# Patient Record
Sex: Female | Born: 1960 | Race: White | Hispanic: No | Smoking: Former smoker
Health system: Southern US, Community
[De-identification: ages and names within clinical notes are randomized; demographics above are authoritative.]

## PROBLEM LIST (undated history)

## (undated) DIAGNOSIS — I639 Cerebral infarction, unspecified: Secondary | ICD-10-CM

## (undated) DIAGNOSIS — T7840XA Allergy, unspecified, initial encounter: Secondary | ICD-10-CM

## (undated) DIAGNOSIS — E079 Disorder of thyroid, unspecified: Secondary | ICD-10-CM

## (undated) DIAGNOSIS — F32A Depression, unspecified: Secondary | ICD-10-CM

## (undated) DIAGNOSIS — I4819 Other persistent atrial fibrillation: Secondary | ICD-10-CM

## (undated) DIAGNOSIS — K219 Gastro-esophageal reflux disease without esophagitis: Secondary | ICD-10-CM

## (undated) DIAGNOSIS — I219 Acute myocardial infarction, unspecified: Secondary | ICD-10-CM

## (undated) DIAGNOSIS — R000 Tachycardia, unspecified: Secondary | ICD-10-CM

## (undated) DIAGNOSIS — E785 Hyperlipidemia, unspecified: Secondary | ICD-10-CM

## (undated) DIAGNOSIS — J302 Other seasonal allergic rhinitis: Secondary | ICD-10-CM

## (undated) DIAGNOSIS — E7800 Pure hypercholesterolemia, unspecified: Secondary | ICD-10-CM

## (undated) DIAGNOSIS — Z8639 Personal history of other endocrine, nutritional and metabolic disease: Secondary | ICD-10-CM

## (undated) DIAGNOSIS — I509 Heart failure, unspecified: Secondary | ICD-10-CM

## (undated) DIAGNOSIS — I10 Essential (primary) hypertension: Secondary | ICD-10-CM

## (undated) DIAGNOSIS — E119 Type 2 diabetes mellitus without complications: Secondary | ICD-10-CM

## (undated) DIAGNOSIS — L409 Psoriasis, unspecified: Secondary | ICD-10-CM

## (undated) DIAGNOSIS — Z9289 Personal history of other medical treatment: Secondary | ICD-10-CM

## (undated) DIAGNOSIS — M1990 Unspecified osteoarthritis, unspecified site: Secondary | ICD-10-CM

## (undated) DIAGNOSIS — I428 Other cardiomyopathies: Secondary | ICD-10-CM

## (undated) DIAGNOSIS — B019 Varicella without complication: Secondary | ICD-10-CM

## (undated) DIAGNOSIS — E669 Obesity, unspecified: Secondary | ICD-10-CM

## (undated) DIAGNOSIS — I43 Cardiomyopathy in diseases classified elsewhere: Secondary | ICD-10-CM

## (undated) DIAGNOSIS — I499 Cardiac arrhythmia, unspecified: Secondary | ICD-10-CM

## (undated) HISTORY — DX: Essential (primary) hypertension: I10

## (undated) HISTORY — DX: Other persistent atrial fibrillation: I48.19

## (undated) HISTORY — DX: Personal history of other medical treatment: Z92.89

## (undated) HISTORY — DX: Allergy, unspecified, initial encounter: T78.40XA

## (undated) HISTORY — DX: Depression, unspecified: F32.A

## (undated) HISTORY — DX: Acute myocardial infarction, unspecified: I21.9

## (undated) HISTORY — PX: EYE MUSCLE SURGERY: SHX370

## (undated) HISTORY — DX: Cerebral infarction, unspecified: I63.9

## (undated) HISTORY — DX: Varicella without complication: B01.9

## (undated) HISTORY — DX: Disorder of thyroid, unspecified: E07.9

## (undated) HISTORY — DX: Heart failure, unspecified: I50.9

---
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
## 1994-12-01 HISTORY — PX: BIOPSY THYROID: PRO38

## 2001-12-29 ENCOUNTER — Emergency Department (HOSPITAL_COMMUNITY): Admission: EM | Admit: 2001-12-29 | Discharge: 2001-12-29 | Payer: Self-pay | Admitting: Emergency Medicine

## 2002-01-03 ENCOUNTER — Emergency Department (HOSPITAL_COMMUNITY): Admission: EM | Admit: 2002-01-03 | Discharge: 2002-01-03 | Payer: Self-pay | Admitting: *Deleted

## 2002-11-10 ENCOUNTER — Encounter: Payer: Self-pay | Admitting: Emergency Medicine

## 2002-11-10 ENCOUNTER — Emergency Department (HOSPITAL_COMMUNITY): Admission: EM | Admit: 2002-11-10 | Discharge: 2002-11-10 | Payer: Self-pay | Admitting: Emergency Medicine

## 2003-03-07 ENCOUNTER — Inpatient Hospital Stay (HOSPITAL_COMMUNITY): Admission: AD | Admit: 2003-03-07 | Discharge: 2003-03-07 | Payer: Self-pay | Admitting: Obstetrics and Gynecology

## 2003-03-07 ENCOUNTER — Encounter: Payer: Self-pay | Admitting: Emergency Medicine

## 2004-02-02 ENCOUNTER — Emergency Department (HOSPITAL_COMMUNITY): Admission: EM | Admit: 2004-02-02 | Discharge: 2004-02-02 | Payer: Self-pay | Admitting: Emergency Medicine

## 2004-09-10 ENCOUNTER — Emergency Department (HOSPITAL_COMMUNITY): Admission: EM | Admit: 2004-09-10 | Discharge: 2004-09-10 | Payer: Self-pay | Admitting: Emergency Medicine

## 2004-12-03 ENCOUNTER — Emergency Department (HOSPITAL_COMMUNITY): Admission: EM | Admit: 2004-12-03 | Discharge: 2004-12-03 | Payer: Self-pay | Admitting: Emergency Medicine

## 2004-12-10 ENCOUNTER — Emergency Department (HOSPITAL_COMMUNITY): Admission: EM | Admit: 2004-12-10 | Discharge: 2004-12-10 | Payer: Self-pay | Admitting: Emergency Medicine

## 2005-10-09 ENCOUNTER — Ambulatory Visit: Payer: Self-pay | Admitting: Internal Medicine

## 2005-10-13 ENCOUNTER — Ambulatory Visit: Payer: Self-pay | Admitting: Internal Medicine

## 2005-11-03 ENCOUNTER — Ambulatory Visit (HOSPITAL_COMMUNITY): Admission: RE | Admit: 2005-11-03 | Discharge: 2005-11-03 | Payer: Self-pay | Admitting: Internal Medicine

## 2005-11-03 ENCOUNTER — Ambulatory Visit: Payer: Self-pay | Admitting: Endocrinology

## 2005-11-06 ENCOUNTER — Ambulatory Visit: Payer: Self-pay | Admitting: Pulmonary Disease

## 2006-02-03 ENCOUNTER — Emergency Department (HOSPITAL_COMMUNITY): Admission: EM | Admit: 2006-02-03 | Discharge: 2006-02-03 | Payer: Self-pay | Admitting: Emergency Medicine

## 2008-04-30 ENCOUNTER — Emergency Department (HOSPITAL_COMMUNITY): Admission: EM | Admit: 2008-04-30 | Discharge: 2008-04-30 | Payer: Self-pay | Admitting: Emergency Medicine

## 2010-12-21 ENCOUNTER — Encounter: Payer: Self-pay | Admitting: Internal Medicine

## 2013-10-25 ENCOUNTER — Emergency Department (HOSPITAL_COMMUNITY): Payer: Self-pay

## 2013-10-25 ENCOUNTER — Observation Stay (HOSPITAL_COMMUNITY)
Admission: EM | Admit: 2013-10-25 | Discharge: 2013-10-26 | Disposition: A | Discharge status: Home / Self Care | Payer: Self-pay | Attending: Cardiovascular Disease | Admitting: Cardiovascular Disease

## 2013-10-25 ENCOUNTER — Encounter (HOSPITAL_COMMUNITY): Payer: Self-pay | Admitting: Emergency Medicine

## 2013-10-25 DIAGNOSIS — R000 Tachycardia, unspecified: Secondary | ICD-10-CM | POA: Insufficient documentation

## 2013-10-25 DIAGNOSIS — E119 Type 2 diabetes mellitus without complications: Secondary | ICD-10-CM | POA: Diagnosis present

## 2013-10-25 DIAGNOSIS — Z87891 Personal history of nicotine dependence: Secondary | ICD-10-CM | POA: Insufficient documentation

## 2013-10-25 DIAGNOSIS — E785 Hyperlipidemia, unspecified: Secondary | ICD-10-CM | POA: Diagnosis present

## 2013-10-25 DIAGNOSIS — E669 Obesity, unspecified: Secondary | ICD-10-CM | POA: Diagnosis present

## 2013-10-25 DIAGNOSIS — Z8639 Personal history of other endocrine, nutritional and metabolic disease: Secondary | ICD-10-CM | POA: Diagnosis present

## 2013-10-25 DIAGNOSIS — E1159 Type 2 diabetes mellitus with other circulatory complications: Secondary | ICD-10-CM | POA: Diagnosis present

## 2013-10-25 DIAGNOSIS — E1142 Type 2 diabetes mellitus with diabetic polyneuropathy: Secondary | ICD-10-CM | POA: Diagnosis present

## 2013-10-25 DIAGNOSIS — I43 Cardiomyopathy in diseases classified elsewhere: Secondary | ICD-10-CM | POA: Diagnosis present

## 2013-10-25 DIAGNOSIS — R062 Wheezing: Secondary | ICD-10-CM | POA: Insufficient documentation

## 2013-10-25 DIAGNOSIS — I4891 Unspecified atrial fibrillation: Principal | ICD-10-CM

## 2013-10-25 DIAGNOSIS — M1990 Unspecified osteoarthritis, unspecified site: Secondary | ICD-10-CM | POA: Diagnosis present

## 2013-10-25 HISTORY — DX: Cardiomyopathy in diseases classified elsewhere: I43

## 2013-10-25 HISTORY — DX: Other cardiomyopathies: I42.8

## 2013-10-25 HISTORY — DX: Cardiomyopathy in diseases classified elsewhere: R00.0

## 2013-10-25 HISTORY — DX: Personal history of other endocrine, nutritional and metabolic disease: Z86.39

## 2013-10-25 HISTORY — DX: Hyperlipidemia, unspecified: E78.5

## 2013-10-25 HISTORY — DX: Unspecified osteoarthritis, unspecified site: M19.90

## 2013-10-25 HISTORY — DX: Obesity, unspecified: E66.9

## 2013-10-25 LAB — CBC WITH DIFFERENTIAL/PLATELET
Basophils Absolute: 0.1 10*3/uL (ref 0.0–0.1)
Basophils Relative: 1 % (ref 0–1)
Eosinophils Relative: 2 % (ref 0–5)
HCT: 43.3 % (ref 36.0–46.0)
MCH: 33.8 pg (ref 26.0–34.0)
MCHC: 36.3 g/dL — ABNORMAL HIGH (ref 30.0–36.0)
MCV: 93.1 fL (ref 78.0–100.0)
Monocytes Absolute: 0.6 10*3/uL (ref 0.1–1.0)
Neutrophils Relative %: 65 % (ref 43–77)
RBC: 4.65 MIL/uL (ref 3.87–5.11)
RDW: 12.8 % (ref 11.5–15.5)

## 2013-10-25 LAB — PROTIME-INR: Prothrombin Time: 12.8 seconds (ref 11.6–15.2)

## 2013-10-25 LAB — COMPREHENSIVE METABOLIC PANEL
AST: 13 U/L (ref 0–37)
AST: 11 U/L (ref 0–37)
Albumin: 3.5 g/dL (ref 3.5–5.2)
Alkaline Phosphatase: 71 U/L (ref 39–117)
BUN: 7 mg/dL (ref 6–23)
Calcium: 8.4 mg/dL (ref 8.4–10.5)
Calcium: 8.6 mg/dL (ref 8.4–10.5)
Chloride: 96 mEq/L (ref 96–112)
Creatinine, Ser: 0.43 mg/dL — ABNORMAL LOW (ref 0.50–1.10)
GFR calc Af Amer: >90 mL/min (ref >90)
GFR calc non Af Amer: >90 mL/min (ref >90)
GFR calc non Af Amer: >90 mL/min (ref >90)
Glucose, Bld: 179 mg/dL — ABNORMAL HIGH (ref 70–99)
Total Protein: 7.1 g/dL (ref 6.0–8.3)

## 2013-10-25 LAB — POCT I-STAT TROPONIN I: Troponin i, poc: 0.01 ng/mL (ref 0.00–0.08)

## 2013-10-25 LAB — TSH: TSH: 1.02 u[IU]/mL (ref 0.350–4.500)

## 2013-10-25 LAB — GLUCOSE, CAPILLARY: Glucose-Capillary: 168 mg/dL — ABNORMAL HIGH (ref 70–99)

## 2013-10-25 LAB — MAGNESIUM: Magnesium: 1.8 mg/dL (ref 1.5–2.5)

## 2013-10-25 LAB — T4, FREE: Free T4: 0.98 ng/dL (ref 0.80–1.80)

## 2013-10-25 LAB — TROPONIN I: Troponin I: <0.3 ng/mL (ref <0.30)

## 2013-10-25 MED ORDER — SODIUM CHLORIDE 0.9 % IJ SOLN: 3.0000 mL | INTRAMUSCULAR | Status: DC | PRN

## 2013-10-25 MED ORDER — WARFARIN SODIUM 7.5 MG PO TABS
7.5000 mg | ORAL_TABLET | Freq: Once | ORAL | Status: DC
  Filled 2013-10-25: qty 1

## 2013-10-25 MED ORDER — ACETAMINOPHEN 325 MG PO TABS
650.0000 mg | ORAL_TABLET | ORAL | Status: DC | PRN
  Administered 2013-10-25 – 2013-10-26 (×3): 650 mg via ORAL
  Filled 2013-10-25 (×3): qty 2

## 2013-10-25 MED ORDER — DILTIAZEM HCL 100 MG IV SOLR
5.0000 mg/h | INTRAVENOUS | Status: DC
  Administered 2013-10-25 – 2013-10-26 (×3): 10 mg/h via INTRAVENOUS
  Filled 2013-10-25 (×3): qty 100

## 2013-10-25 MED ORDER — DILTIAZEM LOAD VIA INFUSION
10.0000 mg | Freq: Once | INTRAVENOUS | Status: AC
  Administered 2013-10-25: 10 mg via INTRAVENOUS
  Filled 2013-10-25: qty 10

## 2013-10-25 MED ORDER — WARFARIN SODIUM 7.5 MG PO TABS
7.5000 mg | ORAL_TABLET | Freq: Once | ORAL | Status: AC
  Administered 2013-10-25: 7.5 mg via ORAL
  Filled 2013-10-25: qty 1

## 2013-10-25 MED ORDER — COUMADIN BOOK
Freq: Once | Status: AC
  Administered 2013-10-25: 1
  Filled 2013-10-25: qty 1

## 2013-10-25 MED ORDER — HEPARIN BOLUS VIA INFUSION
5000.0000 [IU] | Freq: Once | INTRAVENOUS | Status: AC
  Administered 2013-10-25: 5000 [IU] via INTRAVENOUS
  Filled 2013-10-25: qty 5000

## 2013-10-25 MED ORDER — SODIUM CHLORIDE 0.9 % IV SOLN
250.0000 mL | INTRAVENOUS | Status: DC | PRN
  Administered 2013-10-26: 250 mL via INTRAVENOUS

## 2013-10-25 MED ORDER — ONDANSETRON HCL 4 MG/2ML IJ SOLN: 4.0000 mg | Freq: Four times a day (QID) | INTRAMUSCULAR | Status: DC | PRN

## 2013-10-25 MED ORDER — WARFARIN - PHARMACIST DOSING INPATIENT: Freq: Every day | Status: DC

## 2013-10-25 MED ORDER — SODIUM CHLORIDE 0.9 % IJ SOLN: 3.0000 mL | Freq: Two times a day (BID) | INTRAMUSCULAR | Status: DC

## 2013-10-25 MED ORDER — HEPARIN (PORCINE) IN NACL 100-0.45 UNIT/ML-% IJ SOLN
1700.0000 [IU]/h | INTRAMUSCULAR | Status: DC
  Administered 2013-10-25: 1300 [IU]/h via INTRAVENOUS
  Administered 2013-10-26 (×2): 1700 [IU]/h via INTRAVENOUS
  Filled 2013-10-25 (×3): qty 250

## 2013-10-25 MED ORDER — WARFARIN VIDEO
Freq: Once | Status: AC
  Administered 2013-10-26: 12:00:00

## 2013-10-25 NOTE — ED Notes (Addendum)
Pt verbalizes she has had SHOB and non-productive cough x 3 days that worsens at night and w/ exertion. Pt denies Chest pain

## 2013-10-25 NOTE — ED Notes (Signed)
Family at bedside. 

## 2013-10-25 NOTE — Progress Notes (Signed)
ANTICOAGULATION CONSULT NOTE - Initial Consult  Pharmacy Consult for heparin and coumadin Indication: atrial fibrillation  Allergies  Allergen Reactions  . Codeine Itching and Rash    Patient Measurements: Weight: 240 lb (108.863 kg), height 66 inches Heparin Dosing Weight: 84 kg  Vital Signs: Temp: 98.6 F (37 C) (11/25 1139) Temp src: Oral (11/25 1139) BP: 119/85 mmHg (11/25 1615) Pulse Rate: 100 (11/25 1615)  Labs:  Recent Labs  10/25/13 1224  HGB 15.7*  HCT 43.3  PLT 259  CREATININE 0.43*    CrCl is unknown because there is no height on file for the current visit.   Medical History: Past Medical History  Diagnosis Date  . Hyperglycemia   . Obesity     Medications:  See med rec   Assessment: Patient is a 52 y.o F presented to the ED with c/o SOB and generalized weakness. To start heparin and coumadin for new onset Afib with RVR.  Patient stated that she is not on any anticoagulations PTA.  Goal of Therapy:  Heparin level 0.3-0.7 units/ml; INR 2-3 Monitor platelets by anticoagulation protocol: Yes   Plan:  1) heparin 5000 units IV bolus, then drip at 1300 units/hr 2) check 6 hour heparin level 3) coumadin 7.5mg  PO x1 today  Kelly Lang P 10/25/2013,5:46 PM

## 2013-10-25 NOTE — Discharge Planning (Signed)
[facility] Felicia E.- Community Liaison ext [phone]  Follow up appointment was made for 11/16/13 at 2:00 pm, with the [facility] to establish primary care. Patient will also be obtaining the orange card at this visit. Patient was given the orange card application and my contact information for any questions or concerns. Patient is aware of both appointments.

## 2013-10-25 NOTE — H&P (Signed)
Patient ID: Kelly Lang MRN: [id number], DOB/AGE: 1961-08-06   Admit date: 10/25/2013  Primary Physician: No PCP Per Patient  Primary Cardiologist: Amyia Lodwick  Pt. Profile:  Kelly Lang is an obese 52 y.o. female with no past medical history who presented to the ED with new onset Afib with RVR. has not been medically evaluated in approx 8 years. Has no PCP and has not had any medical problems. She started noticing symptoms of cough and cold on Thursday. Saturday morning she developed SOB associated with chest tightness. It was worse with exertion and made better with rest.She hasnt felt her racing heart or had palpitations. Admits to a little lightheadedness, lethargy and weakness. Denies syncope, swelling, orthopnea.  Patient is currently feeling much better after diltiazem in ED.   Problem List  Past Medical History  Diagnosis Date  . Hyperglycemia     Past Surgical History  Procedure Laterality Date  . Tonsillectomy      when she was 9  . Biopsy thyroid      1996  . Eye muscle surgery      young girl     Allergies  Allergies  Allergen Reactions  . Codeine Itching and Rash    Home Medications  Prior to Admission medications   Medication Sig Start Date End Date Taking? Authorizing Provider  ibuprofen (ADVIL,MOTRIN) 100 MG tablet Take 100 mg by mouth every 6 (six) hours as needed for pain or fever.   Yes Historical Provider, MD  pseudoephedrine-guaifenesin (MUCINEX D) 60-600 MG per tablet Take 1 tablet by mouth every 12 (twelve) hours.   Yes Historical Provider, MD    Family History  History reviewed. No pertinent family history.  Social History  History   Social History  . Marital Status: Single    Spouse Name: N/A    Number of Children: N/A  . Years of Education: N/A   Occupational History  . banquet coordinator    Social History Main Topics  . Smoking status: Former Smoker -- 1.00 packs/day for 22 years  . Smokeless tobacco: Not on file  .  Alcohol Use: No  . Drug Use: No  . Sexual Activity: Not on file   Other Topics Concern  . Not on file   Social History Narrative   Moved to [place] from [place] in 2002. She has only seen one medical doctor in that time period when she had insurance. 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     All other systems reviewed and are otherwise negative except as noted above.  Physical Exam  Blood pressure 119/85, pulse 100, temperature 98.6 F (37 C), temperature source Oral, resp. rate 28, weight 240 lb (108.863 kg), SpO2 94.00%.   General: Pleasant, NAD, obese Psych: Normal affect. Neuro: Alert and oriented X 3. Moves all extremities spontaneously. HEENT: Normal  Neck: Supple without bruits or JVD. Lungs:  Resp regular and unlabored, CTA. Rales at the bases Heart: Irregular heart rate and rhythm  Abdomen: Soft, non-tender, non-distended, BS + x 4.  Extremities: No clubbing, cyanosis or edema. DP/PT/Radials 2+ and equal bilaterally.  Labs  No results found for this basename: CKTOTAL, CKMB, TROPONINI,  in the last 72 hours Lab Results  Component Value Date   WBC 8.0 10/25/2013   HGB 15.7* 10/25/2013   HCT 43.3 10/25/2013   MCV 93.1 10/25/2013   PLT 259 10/25/2013     Recent Labs Lab 10/25/13 1224  NA 131*  K 4.0  CL 99  CO2 20  BUN 10  CREATININE 0.43*  CALCIUM 8.4  PROT 7.4  BILITOT 0.7  ALKPHOS 79  ALT 21  AST 13  GLUCOSE 230*     Radiology/Studies  Dg Chest Portable 1 View  10/25/2013   CLINICAL DATA:  Short of breath.  Tachycardia.  EXAM: PORTABLE CHEST - 1 VIEW  COMPARISON:  None.  FINDINGS: The heart size and mediastinal contours are within normal limits. Both lungs are clear. The visualized skeletal structures are unremarkable.  IMPRESSION: No active disease.   Electronically Signed   By: Amie Portland M.D.   On: 10/25/2013 12:30   ECG Afib with RVR     HR 136  ASSESSMENT AND PLAN  Kelly Lang is an obese 52 y.o.  female with no past medical history who presented to the ED with new onset Afib with RVR.  1. AFib with RVR - Diltiazem bolus and drip in ED, improved HR 90-110s, feeling much better currently - History of thyroid problems, unclear history. Will re-evaluate need for DCCV when thyroid studies return - Admit overnight with rate control, continue Diltiazem  - ECHO tomorrow morning - Heparin ggt per pharmacy - Start coumadin - CHADS score unable to be determined currently. HgA1c pending, will monitor BPs (currently normal)  2. History of thyroid issues- unknown problem, stopped taking her synthroid years ago - Thyroid biopsy in 1996  - TSH and free T4 pending  3. Hyperglycemia: BG 230 in ED - Ordered a hg A1C   Signed, Thereasa Parkin, PA-C 10/25/2013, 5:10 PM  Attending Note:   The patient was seen and examined.  Agree with assessment and plan as noted above.  Changes made to the above note as needed.  Pt presents with AF with RVR.  She has not taken her tyhroid medication in years. Her HR has bee slowed with Dilt drip and she is tolerating it with out difficulty.  Will get echo tomorrow.  She may have had AF with RVR for some time - If her LV function is normal - could use PO Dilt.  If the LV function is depressed, would starte PO BB.    Will start heparin , transition to coumadin.  She does not have insurance and would not likeily be able to afford NOAC.  I would favor 4 weeks of anticoagulation and then attempt cardioversion.  If she has significant thyroid issues, it will be difficult to keep her in NSR.      I would not necessarily think that she needs to be therapeutic prior to DC.  Could use Lovenox bridging.    will have Kelly Lang see her in the office in 1 month and I will see her in 1-2 months.  She will need to be set up in the coumadin clinic.   We can cardiovert her after a month of therapeutic anticoagulation.   Kelly Lang, Kelly Lang., MD, [facility] 10/25/2013, 6:31 PM

## 2013-10-25 NOTE — ED Notes (Signed)
MD at bedside. 

## 2013-10-25 NOTE — ED Provider Notes (Signed)
CSN: [id number]     Arrival date & time 10/25/13  1128 History   First MD Initiated Contact with Patient 10/25/13 1203     Chief Complaint  Patient presents with  . Shortness of Breath  . Tachycardia   (Consider location/radiation/quality/duration/timing/severity/associated sxs/prior Treatment) Patient is a 52 y.o. female presenting with shortness of breath. The history is provided by the patient. No language interpreter was used.  Shortness of Breath Severity:  Moderate Associated symptoms: wheezing   Associated symptoms: no abdominal pain, no chest pain, no fever, no headaches and no vomiting   Associated symptoms comment:  Shortness of breath and a feeling of wheezing with chest tightness for the past 3 days. No fever or cough. She has not had any nausea or vomiting. She denies palpitations or history of asthma, cardiac problems.    History reviewed. No pertinent past medical history. History reviewed. No pertinent past surgical history. History reviewed. No pertinent family history. History  Substance Use Topics  . Smoking status: Former Games developer  . Smokeless tobacco: Not on file  . Alcohol Use: No   OB History   Grav Para Term Preterm Abortions TAB SAB Ect Mult Living                 Review of Systems  Constitutional: Negative for fever and chills.  HENT: Negative.   Respiratory: Positive for shortness of breath and wheezing.   Cardiovascular: Negative.  Negative for chest pain and leg swelling.  Gastrointestinal: Negative.  Negative for nausea, vomiting and abdominal pain.  Genitourinary: Negative for dysuria.  Musculoskeletal: Negative.  Negative for myalgias.  Skin: Negative.  Negative for color change.  Neurological: Negative.  Negative for headaches.    Allergies  Codeine  Home Medications   Current Outpatient Rx  Name  Route  Sig  Dispense  Refill  . ibuprofen (ADVIL,MOTRIN) 100 MG tablet   Oral   Take 100 mg by mouth every 6 (six) hours as needed for  pain or fever.         . pseudoephedrine-guaifenesin (MUCINEX D) 60-600 MG per tablet   Oral   Take 1 tablet by mouth every 12 (twelve) hours.          BP 145/116  Pulse 144  Temp(Src) 98.6 F (37 C) (Oral)  Resp 12  Wt 240 lb (108.863 kg)  SpO2 96% Physical Exam  Constitutional: She is oriented to person, place, and time. She appears well-developed and well-nourished. No distress.  HENT:  Head: Normocephalic.  Neck: Normal range of motion. Neck supple.  Cardiovascular: An irregularly irregular rhythm present. Tachycardia present.   Pulmonary/Chest: Effort normal and breath sounds normal. She has no wheezes. She has no rales. She exhibits no tenderness.  Abdominal: Soft. Bowel sounds are normal. There is no tenderness. There is no rebound and no guarding.  Musculoskeletal: Normal range of motion. She exhibits no edema.  Neurological: She is alert and oriented to person, place, and time.  Skin: Skin is warm and dry. No rash noted. She is not diaphoretic.  Psychiatric: She has a normal mood and affect.    ED Course  Procedures (including critical care time) Labs Review Labs Reviewed  CBC WITH DIFFERENTIAL - Abnormal; Notable for the following:    Hemoglobin 15.7 (*)    MCHC 36.3 (*)    All other components within normal limits  COMPREHENSIVE METABOLIC PANEL - Abnormal; Notable for the following:    Sodium 131 (*)    Glucose, Bld 230 (*)  Creatinine, Ser 0.43 (*)    All other components within normal limits  TSH  T4, FREE  POCT I-STAT TROPONIN I   Imaging Review Dg Chest Portable 1 View  10/25/2013   CLINICAL DATA:  Short of breath.  Tachycardia.  EXAM: PORTABLE CHEST - 1 VIEW  COMPARISON:  None.  FINDINGS: The heart size and mediastinal contours are within normal limits. Both lungs are clear. The visualized skeletal structures are unremarkable.  IMPRESSION: No active disease.   Electronically Signed   By: Amie Portland M.D.   On: 10/25/2013 12:30    EKG  Interpretation    Date/Time:  Tuesday October 25 2013 11:34:53 EST Ventricular Rate:  136 PR Interval:    QRS Duration: 88 QT Interval:  336 QTC Calculation: 505 R Axis:   -23 Text Interpretation:  Atrial fibrillation with rapid ventricular response Septal infarct , age undetermined Abnormal ECG Confirmed by RAY MD, DANIELLE ([id number]) on 10/25/2013 12:36:36 PM            MDM  No diagnosis found. 1. Atrial Fib, new onset, with RVR 2. Hyperglycemia  Cardizem bolus and infusion ordered. Patient is NAD, comfortable. Labs pending. Will monitor and re-evaluate after Cardizem. Dr. Rosalia Hammers aware.  HR decreased to 90's after Cardizem. 5mg /hr drip going. BP 126/78. She is comfortable. Discussed admission with patient and with cardiology who will see and admit the patient from Pod E.     Arnoldo Hooker, PA-C 10/25/13 1443

## 2013-10-25 NOTE — ED Provider Notes (Signed)
I have reviewed the report and personally reviewed the above radiology studies.    Hilario Quarry, MD 10/25/13 [phone]

## 2013-10-25 NOTE — ED Notes (Signed)
Pt c/o SOB x 3 days with generalized weakness; pt noted to be tachycardic 140bpm

## 2013-10-26 ENCOUNTER — Encounter (HOSPITAL_COMMUNITY): Payer: Self-pay | Admitting: Physician Assistant

## 2013-10-26 ENCOUNTER — Other Ambulatory Visit: Payer: Self-pay | Admitting: Physician Assistant

## 2013-10-26 DIAGNOSIS — E119 Type 2 diabetes mellitus without complications: Secondary | ICD-10-CM | POA: Diagnosis present

## 2013-10-26 DIAGNOSIS — E1159 Type 2 diabetes mellitus with other circulatory complications: Secondary | ICD-10-CM | POA: Diagnosis present

## 2013-10-26 DIAGNOSIS — Z8639 Personal history of other endocrine, nutritional and metabolic disease: Secondary | ICD-10-CM | POA: Diagnosis present

## 2013-10-26 DIAGNOSIS — E785 Hyperlipidemia, unspecified: Secondary | ICD-10-CM | POA: Diagnosis present

## 2013-10-26 DIAGNOSIS — E669 Obesity, unspecified: Secondary | ICD-10-CM | POA: Diagnosis present

## 2013-10-26 DIAGNOSIS — I43 Cardiomyopathy in diseases classified elsewhere: Secondary | ICD-10-CM | POA: Diagnosis present

## 2013-10-26 DIAGNOSIS — M1990 Unspecified osteoarthritis, unspecified site: Secondary | ICD-10-CM | POA: Diagnosis present

## 2013-10-26 DIAGNOSIS — I517 Cardiomegaly: Secondary | ICD-10-CM

## 2013-10-26 DIAGNOSIS — E1142 Type 2 diabetes mellitus with diabetic polyneuropathy: Secondary | ICD-10-CM | POA: Diagnosis present

## 2013-10-26 LAB — CBC
MCH: 33.6 pg (ref 26.0–34.0)
MCHC: 36.1 g/dL — ABNORMAL HIGH (ref 30.0–36.0)
Platelets: 245 10*3/uL (ref 150–400)

## 2013-10-26 LAB — PROTIME-INR
INR: 1.05 (ref 0.00–1.49)
Prothrombin Time: 13.5 seconds (ref 11.6–15.2)

## 2013-10-26 LAB — BASIC METABOLIC PANEL
Calcium: 8.6 mg/dL (ref 8.4–10.5)
Chloride: 100 mEq/L (ref 96–112)
Creatinine, Ser: 0.53 mg/dL (ref 0.50–1.10)
GFR calc Af Amer: >90 mL/min (ref >90)
GFR calc non Af Amer: >90 mL/min (ref >90)
Glucose, Bld: 224 mg/dL — ABNORMAL HIGH (ref 70–99)
Potassium: 3.8 mEq/L (ref 3.5–5.1)

## 2013-10-26 LAB — HEMOGLOBIN A1C: Mean Plasma Glucose: 240 mg/dL — ABNORMAL HIGH (ref <117)

## 2013-10-26 LAB — GLUCOSE, CAPILLARY
Glucose-Capillary: 199 mg/dL — ABNORMAL HIGH (ref 70–99)
Glucose-Capillary: 240 mg/dL — ABNORMAL HIGH (ref 70–99)

## 2013-10-26 LAB — LIPID PANEL
Cholesterol: 188 mg/dL (ref 0–200)
Total CHOL/HDL Ratio: 6.5 RATIO
Triglycerides: 300 mg/dL — ABNORMAL HIGH (ref <150)

## 2013-10-26 LAB — TROPONIN I: Troponin I: <0.3 ng/mL (ref <0.30)

## 2013-10-26 LAB — HEPARIN LEVEL (UNFRACTIONATED)
Heparin Unfractionated: <0.1 IU/mL — ABNORMAL LOW (ref 0.30–0.70)
Heparin Unfractionated: 0.16 IU/mL — ABNORMAL LOW (ref 0.30–0.70)

## 2013-10-26 MED ORDER — WARFARIN SODIUM 7.5 MG PO TABS
7.5000 mg | ORAL_TABLET | Freq: Once | ORAL | Status: DC
  Filled 2013-10-26: qty 1

## 2013-10-26 MED ORDER — METFORMIN HCL 500 MG PO TABS: 500.0000 mg | ORAL_TABLET | Freq: Every day | ORAL | Status: DC

## 2013-10-26 MED ORDER — HEPARIN BOLUS VIA INFUSION
3000.0000 [IU] | Freq: Once | INTRAVENOUS | Status: AC
  Administered 2013-10-26: 3000 [IU] via INTRAVENOUS
  Filled 2013-10-26: qty 3000

## 2013-10-26 MED ORDER — HEPARIN (PORCINE) IN NACL 100-0.45 UNIT/ML-% IJ SOLN
2100.0000 [IU]/h | INTRAMUSCULAR | Status: DC
  Administered 2013-10-26: 2100 [IU]/h via INTRAVENOUS
  Filled 2013-10-26 (×2): qty 250

## 2013-10-26 MED ORDER — WARFARIN SODIUM 5 MG PO TABS: 7.5000 mg | ORAL_TABLET | Freq: Every day | ORAL | Status: DC

## 2013-10-26 MED ORDER — LIVING WELL WITH DIABETES BOOK
Freq: Once | Status: AC
  Administered 2013-10-26: 09:00:00
  Filled 2013-10-26: qty 1

## 2013-10-26 MED ORDER — METOPROLOL TARTRATE 25 MG PO TABS: 25.0000 mg | ORAL_TABLET | Freq: Two times a day (BID) | ORAL | Status: DC

## 2013-10-26 MED ORDER — METOPROLOL TARTRATE 25 MG PO TABS
25.0000 mg | ORAL_TABLET | Freq: Two times a day (BID) | ORAL | Status: DC
  Administered 2013-10-26: 25 mg via ORAL
  Filled 2013-10-26 (×2): qty 1

## 2013-10-26 MED ORDER — PERFLUTREN LIPID MICROSPHERE
1.0000 mL | INTRAVENOUS | Status: AC | PRN
  Administered 2013-10-26: 1.8 mL via INTRAVENOUS
  Filled 2013-10-26: qty 10

## 2013-10-26 MED ORDER — METFORMIN HCL 500 MG PO TABS
500.0000 mg | ORAL_TABLET | Freq: Every day | ORAL | Status: DC
  Administered 2013-10-26: 500 mg via ORAL
  Filled 2013-10-26 (×2): qty 1

## 2013-10-26 NOTE — Progress Notes (Signed)
DC orders received.  Patient stable with no S/S of distress.  Medication and discharge information reviewed with patient and patient's family.  Patient DC home. Kelly Lang  

## 2013-10-26 NOTE — Discharge Summary (Signed)
Discharge Summary   Patient ID: Kelly Lang MRN: [id number], DOB/AGE: October 18, 1961 52 y.o. Admit date: 10/25/2013 D/C date:     10/26/2013  Primary Cardiologist: Dr. Elease Hashimoto PCP: (new) Doris Cheadle at [facility]    Active Problems:   Atrial fibrillation with RVR- newly diagnosed   Diabetes mellitus- newly diagnosed, uncontrolled    Tachycardia induced cardiomyopathy, EF 40-45%   H/O: hypothyroidism, TFTs normal   Obesity- Body mass index is 37.77 kg/(m^2).   Arthritis   Dyslipidemia    Hospital Course:   Kelly Lang is a 52 y.o. female with a history of obesity, newly diagnosed DM and newly diagnosed dyslipidemia who was admitted on 10/25/13 for atrial fibrillation with RVR.  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 Patient was in her usual state of health until this past week when she developed worsening SOB and chest pain with exertion. When she arrived to the emergency department she was found to be in AFib with RVR (HR 136).  Lab work also revealed hyperglycemia with a Hg A1C of 10 and elevated triglycerides. Of note, patient also reported a history of a thyroid biopsy in 1996 for unknown reason, followed by treatment with Levothyroxine. She quit taking the thyroid medication soon after and was lost to follow up. TSH and free t4 levels were normal. She ruled out for MI. She was not tachypnic or hypoxic. She was treated with IV diltiazem, which was eventaully changed to oral metoprolol. BB was chosen because ECHO revealed mildly reduced LV function (EF:40-45%). This was felt to be a tachycardia mediated cardiomyopathy.  Patient was placed on heparin and transitioned to coumadin. She will be sent home on coumadin with possible planned cardioversion in 4 weeks with Dr. Elease Hashimoto. Follow up appointment with Tereso Newcomer PA-C has been made in two weeks for re-evaluation. Care management  consulted and set up PCP appointment and medication assistance with orange card. She will be discharged home on Metformin for her newly diagnosed diabetes, metoprolol tartarate for Afib rate control and coumadin for anticoagulation. With the addition of BB for rate control blood pressure was a little soft, thus we will wait on adding an ACEi for her reduced EF until re-evaluated as an outpatient. She will also need a follow up ECHO in 2-3 months to re assess LV function. Dr. Patty Sermons has seen and evaluated the patient and deemed her ready for discharged today.   Discharge Vitals: Blood pressure 106/69, pulse 93, temperature 98.2 F (36.8 C), temperature source Oral, resp. rate 18, height 5\' 6"  (1.676 m), weight 233 lb 14.4 oz (106.096 kg), last menstrual period 10/09/2013, SpO2 99.00%.  Labs: Lab Results  Component Value Date   WBC 9.3 10/26/2013   HGB 15.0 10/26/2013   HCT 41.5 10/26/2013   MCV 92.8 10/26/2013   PLT 245 10/26/2013     Recent Labs Lab 10/25/13 2000 10/26/13 0448  NA 131* 133*  K 3.7 3.8  CL 96 100  CO2 25 21  BUN 7 7  CREATININE 0.48* 0.53  CALCIUM 8.6 8.6  PROT 7.1  --   BILITOT 0.9  --   ALKPHOS 71  --   ALT 20  --   AST 11  --   GLUCOSE 179* 224*    Recent Labs  10/25/13 1932 10/26/13 0448 10/26/13 1032  TROPONINI <0.30 <0.30 <0.30   Lab Results  Component Value Date   CHOL  188 10/26/2013   HDL 29* 10/26/2013   LDLCALC 99 10/26/2013   TRIG 300* 10/26/2013   No results found for this basename: DDIMER    Diagnostic Studies/Procedures   2D ECHO 10/26/13  LV EF: 40% - 45% ------------------------------------------------------------ Indications: Atrial fibrillation - 427.31. ------------------------------------------------------------ History: PMH: Obesity. ------------------------------------------------------------ Study Conclusions - Left ventricle: The cavity size was normal. Wall thickness was normal. Systolic function was mildly to  moderately reduced. The estimated ejection fraction was in the range of 40% to 45%. Wall motion was normal; there were no regional wall motion abnormalities. The study is not technically sufficient to allow evaluation of LV diastolic function. - Left atrium: The atrium was mildly dilated. - Right ventricle: Systolic pressure was increased. - Atrial septum: No defect or patent foramen ovale was identified. Transthoracic echocardiography. M-mode, complete 2D, spectral Doppler, and color Doppler. Height: Height: 167.6cm. Height: 66in. Weight: Weight: 106.1kg. Weight: 233.4lb. Body mass index: BMI: 37.8kg/m^2. Body surface area: BSA: 2.36m^2. Blood pressure: Pre-Definity 84/54. Post definity 112/70. Patient status: Inpatient. Location: Echo laboratory. ------------------------------------------------------------ ------------------------------------------------------------ Left ventricle: The cavity size was normal. Wall thickness was normal. Systolic function was mildly to moderately reduced. The estimated ejection fraction was in the range of 40% to 45%. Wall motion was normal; there were no regional wall motion abnormalities. The study is not technically sufficient to allow evaluation of LV diastolic function. ----------------------------------------------------------- Aortic valve: Structurally normal valve. Trileaflet. Cusp separation was normal. Doppler: Transvalvular velocity was within the normal range. There was no stenosis. No regurgitation. ------------------------------------------------------------ Aorta: The aorta was normal, not dilated, and non-diseased. ------------------------------------------------------------ Mitral valve: Structurally normal valve. Leaflet separation was normal. Doppler: Transvalvular velocity was within the normal range. There was no evidence for stenosis. No regurgitation. ------------------------------------------------------------ Left  atrium: The atrium was mildly dilated. ----------------------------------------------------------- Atrial septum: No defect or patent foramen ovale was identified. ----------------------------------------------------------- Right ventricle: The cavity size was normal. Wall thickness was normal. Systolic function was normal. Systolic pressure was increased. ------------------------------------------------------------ Pulmonic valve: Structurally normal valve. Cusp separation was normal. Doppler: Transvalvular velocity was within the normal range. No regurgitation. ------------------------------------------------------------ Tricuspid valve: Structurally normal valve. Leaflet separation was normal. Doppler: Transvalvular velocity was within the normal range. No significant regurgitation. ------------------------------------------------------------ Pulmonary artery: Systolic pressure was within the normal range. ------------------------------------------------------------ Right atrium: The atrium was normal in size.  ------------------------------------------------------------ Pericardium: There was no pericardial effusion.  ------------------------------------------------------------ Systemic veins: Inferior vena cava: The vessel was normal in size; the respirophasic diameter changes were in the normal range (= 50%); findings are consistent with normal central venous pressure.  ------------------------------------------------------------ Post procedure conclusions Ascending Aorta: - The aorta was normal, not dilated, and non-diseased.   Dg Chest Portable 1 View  10/25/2013   CLINICAL DATA:  Short of breath.  Tachycardia.  EXAM: PORTABLE CHEST - 1 VIEW  COMPARISON:  None.  FINDINGS: The heart size and mediastinal contours are within normal limits. Both lungs are clear. The visualized skeletal structures are unremarkable.  IMPRESSION: No active disease.      Discharge  Medications     Medication List    STOP taking these medications       pseudoephedrine-guaifenesin 60-600 MG per tablet  Commonly known as:  MUCINEX D      TAKE these medications       ibuprofen 100 MG tablet  Commonly known as:  ADVIL,MOTRIN  Take 100 mg by mouth every 6 (six) hours as needed for pain or fever.     metFORMIN 500 MG tablet  Commonly known  as:  GLUCOPHAGE  Take 1 tablet (500 mg total) by mouth daily with breakfast.     metoprolol tartrate 25 MG tablet  Commonly known as:  LOPRESSOR  Take 1 tablet (25 mg total) by mouth 2 (two) times daily.     warfarin 5 MG tablet  Commonly known as:  COUMADIN  Take 1.5 tablets (7.5 mg total) by mouth daily. Or as directed.        Disposition   The patient will be discharged in stable condition to home. Discharge Orders   Future Appointments Provider Department Dept Phone   10/31/2013 10:40 AM [facility] [place] Office [phone]   11/09/2013 8:30 AM Kennon Rounds [facility] [place] Office [phone]   11/16/2013 2:00 PM Doris Cheadle, MD [facility] [phone]   11/16/2013 2:30 PM [facility] Financial Counselor [facility] [phone]   Future Orders Complete By Expires   Diet - low sodium heart healthy  As directed    Discharge instructions  As directed    Comments:     Please take coumadin 1.5 tablets daily for now. You will get your INR (coumadin level) checked on Monday, Dec 1 and they will tell you if your dose needs adjusting.  Patients with atrial fibrillation should avoid products with pseudoephedrine, this includes medicines like Sudafed or cough and cold medicines with a "D" on the end.  Patients on blood thinners should generally avoid medicines like ibuprofen, Motrin or Advil due to risk of gastrointestinal bleeding. Please talk to your PCP about alternative options   Increase activity slowly  As  directed    Scheduling Instructions:     She is medically excused from work from Tuesday 10/25/13 through Monday 10/31/13     Follow-up Information   Follow up with [facility] On 11/16/2013. (PCP appt at 2:00pm and Orange Card appt at 2:30pm)    Contact information:   [address] [place] [address] [phone]      Follow up with [facility] On 10/31/2013. (10:40 am )    Contact information:   [address] [place] [address]       Follow up with Tereso Newcomer, PA-C On 11/09/2013. (8:30am)    Specialty:  Physician Assistant   Contact information:   [address] [place] [address] [phone]         Duration of Discharge Encounter: Greater than 30 minutes including physician and PA time.  SignedVenetia Maxon, KATHRYN PA-C 10/26/2013, 3:50 PM

## 2013-10-26 NOTE — Progress Notes (Signed)
Inpatient Diabetes Program Recommendations  AACE/ADA: New Consensus Statement on Inpatient Glycemic Control (2013)  Target Ranges:  Prepandial:   less than 140 mg/dL      Peak postprandial:   less than 180 mg/dL (1-2 hours)      Critically ill patients:  140 - 180 mg/dL   Inpatient Diabetes Program Recommendations HgbA1C: =10  Outpatient Referral: information for free diabetes management classes given to patient   Note: I spoke with patient concerning A1C=10. Patient states that this is a new diagnosis for her.  She states that she is familiar with diabetes since many family members have it.  Information given for a glucose meter and for free OP classes with the dietitian at [facility].  Living Well with Diabetes patient education workbook at bedside.  No questions/concerns at this time.   Thank you  Piedad Climes BSN, RN,CDE Inpatient Diabetes Coordinator [phone] (team pager)

## 2013-10-26 NOTE — Progress Notes (Addendum)
  Echocardiogram 2D Echocardiogram with Definity has been performed.  Cathie Beams 10/26/2013, 10:17 AM

## 2013-10-26 NOTE — Progress Notes (Signed)
ANTICOAGULATION CONSULT NOTE - Follow Up Consult  Pharmacy Consult for heparin Indication: atrial fibrillation  Labs:  Recent Labs  10/25/13 1224 10/25/13 1835 10/25/13 1932 10/25/13 2000 10/26/13 0448  HGB 15.7*  --   --   --  15.0  HCT 43.3  --   --   --  41.5  PLT 259  --   --   --  245  LABPROT  --  12.8  --   --  13.5  INR  --  0.98  --   --  1.05  HEPARINUNFRC  --   --   --   --  <0.10*  CREATININE 0.43*  --   --  0.48* 0.53  TROPONINI  --   --  <0.30  --  <0.30    Assessment: 52yo female undetectable on heparin with initial dosing for new Afib.  Goal of Therapy:  Heparin level 0.3-0.7 units/ml   Plan:  Will rebolus with heparin 3000 units x1 and increase gtt by 4 units/kg/hr to 1700 units/hr and check level in 6hr.  Vernard Gambles, PharmD, BCPS  10/26/2013,6:16 AM

## 2013-10-26 NOTE — Progress Notes (Signed)
ANTICOAGULATION CONSULT NOTE - Follow Up Consult  Pharmacy Consult for Heparin and Coumadin Indication: atrial fibrillation  Allergies  Allergen Reactions  . Codeine Itching and Rash    Patient Measurements: Height: 5\' 6"  (167.6 cm) Weight: 233 lb 14.4 oz (106.096 kg) IBW/kg (Calculated) : 59.3 Heparin Dosing Weight: 84kg  Vital Signs: Temp: 98.6 F (37 C) (11/26 0352) Temp src: Oral (11/26 0352) BP: 112/70 mmHg (11/26 1030) Pulse Rate: 108 (11/26 1030)  Labs:  Recent Labs  10/25/13 1224 10/25/13 1835 10/25/13 1932 10/25/13 2000 10/26/13 0448 10/26/13 1032 10/26/13 1230  HGB 15.7*  --   --   --  15.0  --   --   HCT 43.3  --   --   --  41.5  --   --   PLT 259  --   --   --  245  --   --   LABPROT  --  12.8  --   --  13.5  --   --   INR  --  0.98  --   --  1.05  --   --   HEPARINUNFRC  --   --   --   --  <0.10*  --  0.16*  CREATININE 0.43*  --   --  0.48* 0.53  --   --   TROPONINI  --   --  <0.30  --  <0.30 <0.30  --     Estimated Creatinine Clearance: 103.6 ml/min (by C-G formula based on Cr of 0.53).   Medications:  Heparin @ 1700 units/hr  Assessment: 52yof continues on heparin to coumadin bridge for new afib. Heparin level remains subtherapeutic despite re-bolus and rate increase this morning. No issues with infusion. INR also subtherapeutic after first dose of coumadin as expected. CBC stable. No bleeding reported.  Goal of Therapy:  Heparin level 0.3-0.7 units/ml Monitor platelets by anticoagulation protocol: Yes   Plan:  1) Re-bolus heparin 3000 units x 1 2) Increase heparin drip to 2100 units/hr 3) Check 6 hour heparin level 4) Repeat coumadin 7.5mg  x 1 5) INR in AM  Fredrik Rigger 10/26/2013,1:19 PM

## 2013-10-26 NOTE — Care Management Note (Addendum)
  Page 1 of 1   10/26/2013     11:58:43 AM   CARE MANAGEMENT NOTE 10/26/2013  Patient:  Kelly Lang, Kelly Lang   Account Number:  [id number]  Date Initiated:  10/26/2013  Documentation initiated by:  Donato Schultz  Subjective/Objective Assessment:   Patinet admitted with shortness of breath, afib and RVR     Action/Plan:   Patient without Insurance or PCP.  CM consult completed and f/u complete on PCP and Medication assistance with orange card.   List of pharm given:  HT for po DM meds and [employer] $4.00 list   Anticipated DC Date:  10/26/2013   Anticipated DC Plan:  HOME/SELF CARE      DC Planning Services  CM consult  Follow-up appt scheduled      Choice offered to / List presented to:             Status of service:   Medicare Important Message given?   (If response is "NO", the following Medicare IM given date fields will be blank) Date Medicare IM given:   Date Additional Medicare IM given:    Discharge Disposition:  HOME/SELF CARE  Per UR Regulation:  Reviewed for med. necessity/level of care/duration of stay  If discussed at Long Length of Stay Meetings, dates discussed:    Comments:

## 2013-10-26 NOTE — Progress Notes (Signed)
Patient has watched the Coumadin video with her partner.  [place], Kelly Lang

## 2013-10-26 NOTE — Progress Notes (Signed)
Patient Name: Kelly Lang Date of Encounter: 10/26/2013     Active Problems:   Atrial fibrillation with RVR    SUBJECTIVE  Feels better today with rate control. No chest pain or dyspnea. Is diabetic. A1C is 10.  Will start ADA diet and metformin. Will need to obtain a PCP.  CURRENT MEDS . sodium chloride  3 mL Intravenous Q12H  . warfarin   Does not apply Once  . Warfarin - Pharmacist Dosing Inpatient   Does not apply q1800    OBJECTIVE  Filed Vitals:   10/25/13 1615 10/25/13 1930 10/25/13 2353 10/26/13 0352  BP: 119/85 127/91 157/83 121/75  Pulse: 100 89 91 92  Temp:  98.7 F (37.1 C) 98.8 F (37.1 C) 98.6 F (37 C)  TempSrc:  Oral Oral Oral  Resp: 28 18 18 18   Height:  5\' 6"  (1.676 m)    Weight:  233 lb 14.4 oz (106.096 kg) 233 lb 14.4 oz (106.096 kg) 233 lb 14.4 oz (106.096 kg)  SpO2: 94% 95% 92% 94%    Intake/Output Summary (Last 24 hours) at 10/26/13 0751 Last data filed at 10/25/13 2200  Gross per 24 hour  Intake      0 ml  Output    600 ml  Net   -600 ml   Filed Weights   10/25/13 1930 10/25/13 2353 10/26/13 0352  Weight: 233 lb 14.4 oz (106.096 kg) 233 lb 14.4 oz (106.096 kg) 233 lb 14.4 oz (106.096 kg)    PHYSICAL EXAM  General: Pleasant, NAD. Neuro: Alert and oriented X 3. Moves all extremities spontaneously. Psych: Normal affect. HEENT:  Normal  Neck: Supple without bruits or JVD. Lungs:  Resp regular and unlabored, CTA. Heart: RRR no s3, s4, or murmurs. Pulse irregular. Abdomen: Soft, non-tender, non-distended, BS + x 4.  Extremities: No clubbing, cyanosis or edema. DP/PT/Radials 2+ and equal bilaterally.  Accessory Clinical Findings  CBC  Recent Labs  10/25/13 1224 10/26/13 0448  WBC 8.0 9.3  NEUTROABS 5.2  --   HGB 15.7* 15.0  HCT 43.3 41.5  MCV 93.1 92.8  PLT 259 245   Basic Metabolic Panel  Recent Labs  10/25/13 1224 10/25/13 2000 10/26/13 0448  NA 131* 131* 133*  K 4.0 3.7 3.8  CL 99 96 100  CO2 20 25 21     GLUCOSE 230* 179* 224*  BUN 10 7 7   CREATININE 0.43* 0.48* 0.53  CALCIUM 8.4 8.6 8.6  MG  --  1.8  --    Liver Function Tests  Recent Labs  10/25/13 1224 10/25/13 2000  AST 13 11  ALT 21 20  ALKPHOS 79 71  BILITOT 0.7 0.9  PROT 7.4 7.1  ALBUMIN 3.5 3.3*   No results found for this basename: LIPASE, AMYLASE,  in the last 72 hours Cardiac Enzymes  Recent Labs  10/25/13 1932 10/26/13 0448  TROPONINI <0.30 <0.30   BNP No components found with this basename: POCBNP,  D-Dimer No results found for this basename: DDIMER,  in the last 72 hours Hemoglobin A1C  Recent Labs  10/25/13 1839  HGBA1C 10.0*   Fasting Lipid Panel  Recent Labs  10/26/13 0448  CHOL 188  HDL 29*  LDLCALC 99  TRIG 782*  CHOLHDL 6.5   Thyroid Function Tests  Recent Labs  10/25/13 1224  TSH 1.020    TELE  Atrial fibrillation with controlled VR  ECG  No ischemic changes.  Radiology/Studies  Dg Chest Portable 1 View  10/25/2013  CLINICAL DATA:  Short of breath.  Tachycardia.  EXAM: PORTABLE CHEST - 1 VIEW  COMPARISON:  None.  FINDINGS: The heart size and mediastinal contours are within normal limits. Both lungs are clear. The visualized skeletal structures are unremarkable.  IMPRESSION: No active disease.   Electronically Signed   By: Amie Portland M.D.   On: 10/25/2013 12:30    ASSESSMENT AND PLAN 1.  Atrial fibrillation probably one week ago onset by history. 2.  Diabetes mellitus  Plan: Will get echo today. She may have had AF with RVR for some time - If her LV function is normal - could use PO Dilt. If the LV function is depressed, would starte PO BB.  Will start heparin , transition to coumadin. She does not have insurance and would not likeily be able to afford NOAC. I would favor 4 weeks of anticoagulation and then attempt cardioversion.   I would not necessarily think that she needs to be therapeutic prior to DC.   will have Lawson Fiscal see her in the office in 1 month and Dr.  Elease Hashimoto will see in 1-2 months. She will need to be set up in the coumadin clinic. We can cardiovert her after a month of therapeutic anticoagulation.   Possible discharge later today on coumadin and oral rate control, after echo is done.  Signed, Cassell Clement MD

## 2013-10-31 ENCOUNTER — Ambulatory Visit (INDEPENDENT_AMBULATORY_CARE_PROVIDER_SITE_OTHER): Payer: Self-pay | Admitting: Pharmacist

## 2013-10-31 DIAGNOSIS — Z7901 Long term (current) use of anticoagulants: Secondary | ICD-10-CM

## 2013-10-31 DIAGNOSIS — I4891 Unspecified atrial fibrillation: Secondary | ICD-10-CM

## 2013-11-09 ENCOUNTER — Encounter: Payer: Self-pay | Admitting: Physician Assistant

## 2013-11-09 ENCOUNTER — Ambulatory Visit (INDEPENDENT_AMBULATORY_CARE_PROVIDER_SITE_OTHER): Payer: Self-pay | Admitting: Physician Assistant

## 2013-11-09 ENCOUNTER — Ambulatory Visit (INDEPENDENT_AMBULATORY_CARE_PROVIDER_SITE_OTHER): Payer: Self-pay | Admitting: Pharmacist

## 2013-11-09 VITALS — BP 120/80 | HR 103 | Ht 66.0 in | Wt 231.0 lb

## 2013-11-09 DIAGNOSIS — I4891 Unspecified atrial fibrillation: Secondary | ICD-10-CM

## 2013-11-09 DIAGNOSIS — I43 Cardiomyopathy in diseases classified elsewhere: Secondary | ICD-10-CM

## 2013-11-09 DIAGNOSIS — Z8639 Personal history of other endocrine, nutritional and metabolic disease: Secondary | ICD-10-CM

## 2013-11-09 DIAGNOSIS — Z862 Personal history of diseases of the blood and blood-forming organs and certain disorders involving the immune mechanism: Secondary | ICD-10-CM

## 2013-11-09 DIAGNOSIS — Z7901 Long term (current) use of anticoagulants: Secondary | ICD-10-CM

## 2013-11-09 DIAGNOSIS — E119 Type 2 diabetes mellitus without complications: Secondary | ICD-10-CM

## 2013-11-09 DIAGNOSIS — R000 Tachycardia, unspecified: Secondary | ICD-10-CM

## 2013-11-09 LAB — POCT INR: INR: 2.9

## 2013-11-09 MED ORDER — METOPROLOL TARTRATE 50 MG PO TABS: 50.0000 mg | ORAL_TABLET | Freq: Two times a day (BID) | ORAL | Status: DC

## 2013-11-09 NOTE — Progress Notes (Signed)
[address] [place], [place]  [address] Phone: [phone] Fax:  [phone]  Date:  11/09/2013   ID:  Kelly Lang, DOB Jun 17, 1961, MRN [id number]  PCP:  No PCP Per Patient  Cardiologist:  Dr. Delane Ginger    History of Present Illness: Kelly Lang is a 52 y.o. female with a hx of T2DM, HL, thyroid disease.  She had been Synthroid for some time after a thyroid bx in 1996.  She was recently admitted 11/25-11/26 with AFib with RVR.  She was rate controlled with IV diltiazem initially but this was transitioned to po Metoprolol.  Echo (10/26/13):  EF 40-45%, normal wall motion, mild LAE.  CHADS2-VASc= 3 (LV dysfunction, DM, female).  She was placed on coumadin with plan for possible DCCV after 3-4 weeks of therapeutic anticoagulation.  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  BP was too soft to add ACEI.  It was recommended she have a follow up echo in 2-3 mos.  She is doing well since d/c.  She has some DOE with more extreme activities.  She is NYHA Class 2-2b.  She denies chest pain.  She denies syncope or dizziness.  She has slept on 2 pillows for years.  No PND.  No edema.    Recent Labs: 10/25/2013: ALT 20; TSH 1.020  10/26/2013: Creatinine 0.53; HDL 29*; Hemoglobin 15.0; LDL (calc) 99; Potassium 3.8 10/25/2013: Hemoglobin-A1c 10.0*   Wt Readings from Last 3 Encounters:  10/26/13 233 lb 14.4 oz (106.096 kg)     Past Medical History  Diagnosis Date  . Diabetes mellitus     a. hg A1c 10, newly diagnosed (10/26/13)  . Obesity   . H/O: hypothyroidism     a. thyroid biopsy 1996 with synthroid. Stopped taking rx and was loss to follow up b. normal thyroid function 10/20/13  . Arthritis   . Atrial fibrillation with RVR     a diagnosed 10/25/2013  . Tachycardia induced cardiomyopathy     a. 2/2 Afib with RVR for unknown duration (EF: 40-45%)  . Dyslipidemia     Current Outpatient Prescriptions  Medication Sig Dispense Refill  .  ibuprofen (ADVIL,MOTRIN) 100 MG tablet Take 100 mg by mouth every 6 (six) hours as needed for pain or fever.      . metFORMIN (GLUCOPHAGE) 500 MG tablet Take 1 tablet (500 mg total) by mouth daily with breakfast.  30 tablet  2  . metoprolol tartrate (LOPRESSOR) 25 MG tablet Take 1 tablet (25 mg total) by mouth 2 (two) times daily.  60 tablet  6  . warfarin (COUMADIN) 5 MG tablet Take 1.5 tablets (7.5 mg total) by mouth daily. Or as directed.  60 tablet  2   No current facility-administered medications for this visit.    Allergies:   Codeine   Social History:  The patient  reports that she quit smoking about 2 years ago. Her smoking use included Cigarettes. She has a 22 pack-year smoking history. She has never used smokeless tobacco. She reports that she drinks alcohol. She reports that she does not use illicit drugs.   Family History:  The patient's family history is not on file.   ROS:  Please see the history of present illness.   She denies any bleeding problems.   All other systems reviewed and negative.   PHYSICAL EXAM: VS:  BP 120/80  Pulse 103  Ht 5\' 6"  (1.676 m)  Wt 231 lb (104.781 kg)  BMI  37.30 kg/m2  LMP 10/09/2013 Well nourished, well developed, in no acute distress HEENT: normal Neck: no JVD Cardiac:  normal S1, S2; irregularly irregular rhythm; no murmur Lungs:  clear to auscultation bilaterally, no wheezing, rhonchi or rales Abd: soft, nontender, no hepatomegaly Ext: no edema Skin: warm and dry Neuro:  CNs 2-12 intact, no focal abnormalities noted  EKG:  Atrial fibrillation, HR 103, nonspecific ST-T wave changes     ASSESSMENT AND PLAN:  1. Atrial Fibrillation:  She remains in atrial fibrillation. Rate control is improved. However, her heart is still elevated. I will increase her metoprolol to 50 mg twice a day. She remains on Coumadin and has been therapeutic since 12/1. We will plan on follow up in several weeks. If she remains in atrial fibrillation at that time,  arrange cardioversion. I discussed this with her today. 2. Cardiomyopathy:  Volume appears to be stable. She remains on beta blocker therapy. I will eventually try to place her on ACEI. 3. Diabetes Mellitus:  She has follow up next week with primary care. Continue metformin. 4. Hx of Thyroid Disorder:  Recent TSH normal. 5. Disposition:  Follow up with me in 2-3 weeks.  Signed, Tereso Newcomer, PA-C  11/09/2013 8:14 AM

## 2013-11-09 NOTE — Patient Instructions (Signed)
INCREASE METOPROLOL TO 50 MG TWICE DAILY; A NEW RX WAS SENT IN TODAY  PLEASE FOLLOW UP WITH [place], [facility] 11/29/13 @ 8:50

## 2013-11-16 ENCOUNTER — Ambulatory Visit: Payer: Self-pay | Attending: Internal Medicine | Admitting: Internal Medicine

## 2013-11-16 ENCOUNTER — Ambulatory Visit: Payer: Self-pay

## 2013-11-16 ENCOUNTER — Encounter: Payer: Self-pay | Admitting: Internal Medicine

## 2013-11-16 ENCOUNTER — Ambulatory Visit (INDEPENDENT_AMBULATORY_CARE_PROVIDER_SITE_OTHER): Payer: Self-pay | Admitting: *Deleted

## 2013-11-16 VITALS — BP 139/87 | HR 86 | Temp 98.4°F | Resp 17

## 2013-11-16 DIAGNOSIS — Z139 Encounter for screening, unspecified: Secondary | ICD-10-CM

## 2013-11-16 DIAGNOSIS — I4891 Unspecified atrial fibrillation: Secondary | ICD-10-CM

## 2013-11-16 DIAGNOSIS — Z8639 Personal history of other endocrine, nutritional and metabolic disease: Secondary | ICD-10-CM

## 2013-11-16 DIAGNOSIS — Z7901 Long term (current) use of anticoagulants: Secondary | ICD-10-CM

## 2013-11-16 DIAGNOSIS — E039 Hypothyroidism, unspecified: Secondary | ICD-10-CM | POA: Insufficient documentation

## 2013-11-16 DIAGNOSIS — Z862 Personal history of diseases of the blood and blood-forming organs and certain disorders involving the immune mechanism: Secondary | ICD-10-CM

## 2013-11-16 DIAGNOSIS — E119 Type 2 diabetes mellitus without complications: Secondary | ICD-10-CM

## 2013-11-16 DIAGNOSIS — E669 Obesity, unspecified: Secondary | ICD-10-CM

## 2013-11-16 LAB — POCT INR: INR: 3.5

## 2013-11-16 MED ORDER — METFORMIN HCL 500 MG PO TABS: 500.0000 mg | ORAL_TABLET | Freq: Two times a day (BID) | ORAL | Status: DC

## 2013-11-16 NOTE — Progress Notes (Signed)
Patient here for hospital follow up Recently diagnosed with DM and Afib Here PT/INR today was 3.5 She was seen at the cardiologist this am and instructed To hold her coumadin tonight and restart the medication tomorrow Her blood sugars today were 131 at breakfast and  191 at lunch

## 2013-11-16 NOTE — Progress Notes (Signed)
MRN: [id number] Name: Kelly Lang  Sex: female Age: 52 y.o. DOB: 1961/11/19  Allergies: Codeine  Chief Complaint  Patient presents with  . Hospitalization Follow-up    HPI: Patient is 52 y.o. female  comes for first time to establish medical care, she has history of diabetes hyperlipidemia, hypothyroidism, last month she was hospitalized with symptoms of shortness of breath, EMR reviewed she was found to have atrial fibrillation with RVR, she was initially treated with IV Cardizem and switched to oral beta blocker had echo done which showed mildly reduced LV function, patient was initially placed on heparin and then later on had been on Coumadin, patient already followed  with her cardiologist. Patient denies any chest pain or shortness of breath denies any leg swelling or orthopnea. Patient had INR done which was elevated and she was advised by her cardiologist to hold Coumadin tonight and resume tomorrow, she denies any bleeding. Patient was recently diagnosed with diabetes her last hemoglobin A1c was 10%.  Past Medical History  Diagnosis Date  . Diabetes mellitus     a. hg A1c 10, newly diagnosed (10/26/13)  . Obesity   . H/O: hypothyroidism     a. thyroid biopsy 1996 with synthroid. Stopped taking rx and was loss to follow up b. normal thyroid function 10/20/13  . Arthritis   . Atrial fibrillation with RVR     a diagnosed 10/25/2013  . Tachycardia induced cardiomyopathy     a. 2/2 Afib with RVR for unknown duration (EF: 40-45%)  . Dyslipidemia     Past Surgical History  Procedure Laterality Date  . Biopsy thyroid      1996  . Tonsillectomy  1971  . Eye muscle surgery Right ~ 1966      Medication List       This list is accurate as of: 11/16/13  2:39 PM.  Always use your most recent med list.               acetaminophen 500 MG tablet  Commonly known as:  TYLENOL  Take 500 mg by mouth every 6 (six) hours as needed.     metFORMIN 500 MG tablet  Commonly known  as:  GLUCOPHAGE  Take 1 tablet (500 mg total) by mouth 2 (two) times daily with a meal.     metoprolol 50 MG tablet  Commonly known as:  LOPRESSOR  Take 1 tablet (50 mg total) by mouth 2 (two) times daily.     warfarin 5 MG tablet  Commonly known as:  COUMADIN  Take 7.5 mg by mouth daily. Or as directed. mon and fri 1 tab, rest of week 1 and 1/2 tab        Meds ordered this encounter  Medications  . metFORMIN (GLUCOPHAGE) 500 MG tablet    Sig: Take 1 tablet (500 mg total) by mouth 2 (two) times daily with a meal.    Dispense:  60 tablet    Refill:  3    Order Specific Question:  Supervising Provider    Answer:  Kristeen Miss J [[id number]]     There is no immunization history on file for this patient.  History reviewed. No pertinent family history.  History  Substance Use Topics  . Smoking status: Former Smoker -- 1.00 packs/day for 22 years    Types: Cigarettes    Quit date: 12/01/2010  . Smokeless tobacco: Never Used  . Alcohol Use: Yes     Comment: 10/25/2013 "glass of wine maybe q other  month"    Review of Systems  As noted in HPI  Filed Vitals:   11/16/13 1412  BP: 139/87  Pulse: 86  Temp: 98.4 F (36.9 C)  Resp: 17    Physical Exam  Physical Exam  Constitutional: No distress.  Eyes: EOM are normal. Pupils are equal, round, and reactive to light.  Neck: Neck supple.  Cardiovascular: Normal rate.   Irregularly irregular  Pulmonary/Chest: No respiratory distress. She has no wheezes. She has no rales.  Musculoskeletal: She exhibits no edema.  Psychiatric: Affect normal.    CBC    Component Value Date/Time   WBC 9.3 10/26/2013 0448   RBC 4.47 10/26/2013 0448   HGB 15.0 10/26/2013 0448   HCT 41.5 10/26/2013 0448   PLT 245 10/26/2013 0448   MCV 92.8 10/26/2013 0448   LYMPHSABS 2.0 10/25/2013 1224   MONOABS 0.6 10/25/2013 1224   EOSABS 0.2 10/25/2013 1224   BASOSABS 0.1 10/25/2013 1224    CMP     Component Value Date/Time   NA 133*  10/26/2013 0448   K 3.8 10/26/2013 0448   CL 100 10/26/2013 0448   CO2 21 10/26/2013 0448   GLUCOSE 224* 10/26/2013 0448   BUN 7 10/26/2013 0448   CREATININE 0.53 10/26/2013 0448   CALCIUM 8.6 10/26/2013 0448   PROT 7.1 10/25/2013 2000   ALBUMIN 3.3* 10/25/2013 2000   AST 11 10/25/2013 2000   ALT 20 10/25/2013 2000   ALKPHOS 71 10/25/2013 2000   BILITOT 0.9 10/25/2013 2000   GFRNONAA >90 10/26/2013 0448   GFRAA >90 10/26/2013 0448    Lab Results  Component Value Date/Time   CHOL 188 10/26/2013  4:48 AM    No components found with this basename: hga1c    Lab Results  Component Value Date/Time   AST 11 10/25/2013  8:00 PM    Assessment and Plan  Diabetes mellitus - Plan: Last hemoglobin A1c 10%, I have increased the dose of  metFORMIN (GLUCOPHAGE) 500 MG tablet twice a day, COMPLETE METABOLIC PANEL WITH GFR, Lipid panel  H/O: hypothyroidism Last TSH level was normal.  Atrial fibrillation .... rate controlled continue with metoprolol and Coumadin as per her cardiologist.  Obesity ... diet and exercise.  Screening - Plan: Vit D  25 hydroxy (rtn osteoporosis monitoring), MM Digital Screening   Health Maintenance  Patient declined for flu shot and pneumonia shot. Mammogram ordered.  Return in about 2 months (around 01/17/2014).  Doris Cheadle, MD

## 2013-11-29 ENCOUNTER — Encounter (HOSPITAL_COMMUNITY): Payer: Self-pay | Admitting: Pharmacist

## 2013-11-29 ENCOUNTER — Encounter: Payer: Self-pay | Admitting: Physician Assistant

## 2013-11-29 ENCOUNTER — Ambulatory Visit (INDEPENDENT_AMBULATORY_CARE_PROVIDER_SITE_OTHER): Payer: Self-pay | Admitting: Pharmacist

## 2013-11-29 ENCOUNTER — Ambulatory Visit (INDEPENDENT_AMBULATORY_CARE_PROVIDER_SITE_OTHER): Payer: Self-pay | Admitting: Physician Assistant

## 2013-11-29 ENCOUNTER — Encounter: Payer: Self-pay | Admitting: *Deleted

## 2013-11-29 VITALS — BP 118/74 | HR 97 | Ht 65.0 in | Wt 232.0 lb

## 2013-11-29 DIAGNOSIS — Z7901 Long term (current) use of anticoagulants: Secondary | ICD-10-CM

## 2013-11-29 DIAGNOSIS — E785 Hyperlipidemia, unspecified: Secondary | ICD-10-CM

## 2013-11-29 DIAGNOSIS — I43 Cardiomyopathy in diseases classified elsewhere: Secondary | ICD-10-CM

## 2013-11-29 DIAGNOSIS — I4891 Unspecified atrial fibrillation: Secondary | ICD-10-CM

## 2013-11-29 DIAGNOSIS — R000 Tachycardia, unspecified: Secondary | ICD-10-CM

## 2013-11-29 LAB — CBC WITH DIFFERENTIAL/PLATELET
Basophils Relative: 0.6 % (ref 0.0–3.0)
Eosinophils Absolute: 0.2 10*3/uL (ref 0.0–0.7)
Hemoglobin: 14.3 g/dL (ref 12.0–15.0)
Lymphocytes Relative: 20 % (ref 12.0–46.0)
MCHC: 33.8 g/dL (ref 30.0–36.0)
MCV: 93.7 fl (ref 78.0–100.0)
Neutro Abs: 5.4 10*3/uL (ref 1.4–7.7)
Neutrophils Relative %: 69.7 % (ref 43.0–77.0)
RBC: 4.51 Mil/uL (ref 3.87–5.11)

## 2013-11-29 LAB — BASIC METABOLIC PANEL
BUN: 12 mg/dL (ref 6–23)
CO2: 27 mEq/L (ref 19–32)
Calcium: 8.6 mg/dL (ref 8.4–10.5)
Chloride: 105 mEq/L (ref 96–112)
Glucose, Bld: 147 mg/dL — ABNORMAL HIGH (ref 70–99)
Potassium: 4.2 mEq/L (ref 3.5–5.1)

## 2013-11-29 LAB — PROTIME-INR: Prothrombin Time: 47.9 s — ABNORMAL HIGH (ref 10.2–12.4)

## 2013-11-29 MED ORDER — LISINOPRIL 2.5 MG PO TABS: 2.5000 mg | ORAL_TABLET | Freq: Every day | ORAL | Status: DC

## 2013-11-29 NOTE — H&P (Signed)
Shahira Fiske, H 11/29/2013  

## 2013-11-29 NOTE — Progress Notes (Signed)
[address] [place], [place]  [address] Phone: [phone] Fax:  [phone]  Date:  11/29/2013   ID:  Kelly Lang, DOB June 11, 1961, MRN [id number]  PCP:  No PCP Per Patient  Cardiologist:  Dr. Delane Ginger    History of Present Illness: Kelly Lang is a 52 y.o. female with a hx of T2DM, HL, thyroid disease.  She had been on Synthroid for some time after a thyroid bx in 1996.  She was recently admitted 10/2013 with AFib with RVR.  Echo (10/26/13):  EF 40-45%, normal wall motion, mild LAE.  CHADS2-VASc= 3 (LV dysfunction, DM, female).  She was placed on coumadin with plan for possible DCCV after 3-4 weeks of therapeutic anticoagulation.  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  BP was too soft to add ACEI.  It was recommended she have a follow up echo in 2-3 mos.  I saw her in follow up 11/09/13.  Her Metoprolol was adjusted for rate control.  She was brought back today for follow up with an eye towards DCCV if she remained in AFib.  She has done well since last seen.  The patient denies chest pain, shortness of breath, syncope, orthopnea, PND or significant pedal edema.  She just returned from a trip to [place] for the Holidays.    Recent Labs: 10/25/2013: ALT 20; TSH 1.020  10/26/2013: Creatinine 0.53; HDL 29*; Hemoglobin 15.0; LDL (calc) 99; Potassium 3.8  11/29/2013: POC INR 4.5   INR  Date/Time Value Range Status  11/29/2013  8:30 AM 4.5   Final  11/16/2013  8:39 AM 3.5   Final  11/09/2013  8:10 AM 2.9   Final  10/31/2013 11:07 AM 2.9   Final  10/26/2013  4:48 AM 1.05  0.00 - 1.49 Final  10/25/2013  6:35 PM 0.98  0.00 - 1.49 Final    Wt Readings from Last 3 Encounters:  11/29/13 232 lb (105.235 kg)  11/09/13 231 lb (104.781 kg)  10/26/13 233 lb 14.4 oz (106.096 kg)     Past Medical History  Diagnosis Date  . Diabetes mellitus     a. hg A1c 10, newly diagnosed (10/26/13)  . Obesity   . H/O: hypothyroidism     a. thyroid  biopsy 1996 with synthroid. Stopped taking rx and was loss to follow up b. normal thyroid function 10/20/13  . Arthritis   . Atrial fibrillation with RVR     a diagnosed 10/25/2013  . Tachycardia induced cardiomyopathy     a. 2/2 Afib with RVR for unknown duration (EF: 40-45%)  . Dyslipidemia     Current Outpatient Prescriptions  Medication Sig Dispense Refill  . acetaminophen (TYLENOL) 500 MG tablet Take 500 mg by mouth every 6 (six) hours as needed.      . metFORMIN (GLUCOPHAGE) 500 MG tablet Take 1 tablet (500 mg total) by mouth 2 (two) times daily with a meal.  60 tablet  3  . metoprolol tartrate (LOPRESSOR) 50 MG tablet Take 1 tablet (50 mg total) by mouth 2 (two) times daily.  60 tablet  6  . warfarin (COUMADIN) 5 MG tablet Take 7.5 mg by mouth daily. 1 tab all day except 1.5 tab on Tuesday, Thursday, and Sunday       No current facility-administered medications for this visit.    Allergies:   Codeine   Social History:  The patient  reports that she quit smoking about 2 years ago. Her smoking use included  Cigarettes. She has a 22 pack-year smoking history. She has never used smokeless tobacco. She reports that she drinks alcohol. She reports that she does not use illicit drugs.   Family History:  The patient's family history is not on file.   ROS:  Please see the history of present illness.   No bleeding problems.   All other systems reviewed and negative.   PHYSICAL EXAM: VS:  BP 118/74  Pulse 97  Ht 5\' 5"  (1.651 m)  Wt 232 lb (105.235 kg)  BMI 38.61 kg/m2 Well nourished, well developed, in no acute distress HEENT: normal Neck: no JVD Cardiac:  normal S1, S2; irregularly irregular rhythm; no murmur Lungs:  clear to auscultation bilaterally, no wheezing, rhonchi or rales Abd: soft, nontender, no hepatomegaly Ext: no edema Skin: warm and dry Neuro:  CNs 2-12 intact, no focal abnormalities noted  EKG:   Atrial fibrillation, HR 97  ASSESSMENT AND PLAN:  1. Atrial  Fibrillation:  She remains in AFib.  HR is better controlled. Her INR has remained therapeutic for > 3 weeks.  I reviewed with Dr. Tobias Alexander (DOD).  We will arrange DCCV next week.  She had a visit for INR check 12/06/2013.   2. Cardiomyopathy:  Likely tachycardia mediated.  Continue current dose of beta blocker.  Will try to add low dose ACEI to see if she can tolerate it.  Start Lisinopril 2.5 mg QD.  Check BMET in 1 week.  3. Diabetes Mellitus:  Follow up with primary care.  4. Hx of Thyroid Disorder:  Recent TSH normal. 5. Disposition:  Follow up with me or Dr. Delane Ginger in 2-3 weeks (after DCCV).    Signed, Tereso Newcomer, PA-C  11/29/2013 9:03 AM

## 2013-11-29 NOTE — Patient Instructions (Signed)
START LISINOPRIL 2.5 MG DAILY  Your physician has recommended that you have a Cardioversion (DCCV). Electrical Cardioversion uses a jolt of electricity to your heart either through paddles or wired patches attached to your chest. This is a controlled, usually prescheduled, procedure. Defibrillation is done under light anesthesia in the hospital, and you usually go home the day of the procedure. This is done to get your heart back into a normal rhythm. You are not awake for the procedure. Please see the instruction sheet given to you today.  LAB WORK TODAY; BMET, CBC W/DIFF, PT/INR  REPEAT BMET IN 1 WEEK WHEN YOU COME IN FOR YOUR COUMADIN VISIT 12/06/13  PLEASE FOLLOW UP WITH DR. Elease Hashimoto IN 2-3 WEEKS; IF HE IS NOT AVA LIABLE THEN SCHEDULE WITH SCOTT WEAVER, PAC SAME DAY DR. Elease Hashimoto IS IN THE OFFICE

## 2013-11-29 NOTE — H&P (Signed)
History and Physical   Date:  11/29/2013   ID:  [name], DOB 05/25/1961, MRN [id number]  PCP:  No PCP Per Patient  Cardiologist:  Dr. Delane Ginger    History of Present Illness: Kelly Lang is a 52 y.o. female with a hx of T2DM, HL, thyroid disease.  She had been on Synthroid for some time after a thyroid bx in 1996.  She was recently admitted 10/2013 with AFib with RVR.  Echo (10/26/13):  EF 40-45%, normal wall motion, mild LAE.  CHADS2-VASc= 3 (LV dysfunction, DM, female).  She was placed on coumadin with plan for possible DCCV after 3-4 weeks of therapeutic anticoagulation.  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  BP was too soft to add ACEI.  It was recommended she have a follow up echo in 2-3 mos.  I saw her in follow up 11/09/13.  Her Metoprolol was adjusted for rate control.  She was brought back today for follow up with an eye towards DCCV if she remained in AFib.  She has done well since last seen.  The patient denies chest pain, shortness of breath, syncope, orthopnea, PND or significant pedal edema.  She just returned from a trip to [place] for the Holidays.    Recent Labs: 10/25/2013: ALT 20; TSH 1.020  10/26/2013: Creatinine 0.53; HDL 29*; Hemoglobin 15.0; LDL (calc) 99; Potassium 3.8  11/29/2013: POC INR 4.5   INR  Date/Time Value Range Status  11/29/2013  8:30 AM 4.5   Final  11/16/2013  8:39 AM 3.5   Final  11/09/2013  8:10 AM 2.9   Final  10/31/2013 11:07 AM 2.9   Final  10/26/2013  4:48 AM 1.05  0.00 - 1.49 Final  10/25/2013  6:35 PM 0.98  0.00 - 1.49 Final    Wt Readings from Last 3 Encounters:  11/29/13 232 lb (105.235 kg)  11/09/13 231 lb (104.781 kg)  10/26/13 233 lb 14.4 oz (106.096 kg)     Past Medical History  Diagnosis Date  . Diabetes mellitus     a. hg A1c 10, newly diagnosed (10/26/13)  . Obesity   . H/O: hypothyroidism     a. thyroid biopsy 1996 with synthroid. Stopped taking rx and was loss to follow up  b. normal thyroid function 10/20/13  . Arthritis   . Atrial fibrillation with RVR     a diagnosed 10/25/2013  . Tachycardia induced cardiomyopathy     a. 2/2 Afib with RVR for unknown duration (EF: 40-45%)  . Dyslipidemia     Current Outpatient Prescriptions  Medication Sig Dispense Refill  . acetaminophen (TYLENOL) 500 MG tablet Take 500 mg by mouth every 6 (six) hours as needed.      . metFORMIN (GLUCOPHAGE) 500 MG tablet Take 1 tablet (500 mg total) by mouth 2 (two) times daily with a meal.  60 tablet  3  . metoprolol tartrate (LOPRESSOR) 50 MG tablet Take 1 tablet (50 mg total) by mouth 2 (two) times daily.  60 tablet  6  . warfarin (COUMADIN) 5 MG tablet Take 7.5 mg by mouth daily. 1 tab all day except 1.5 tab on Tuesday, Thursday, and Sunday       No current facility-administered medications for this visit.    Allergies:   Codeine   Social History:  The patient  reports that she quit smoking about 2 years ago. Her smoking use included Cigarettes. She has a 22 pack-year smoking history. She has never used smokeless  tobacco. She reports that she drinks alcohol. She reports that she does not use illicit drugs.   Family History:  The patient's family history is not on file.   ROS:  Please see the history of present illness.   No bleeding problems.   All other systems reviewed and negative.   PHYSICAL EXAM: VS:  BP 118/74  Pulse 97  Ht 5\' 5"  (1.651 m)  Wt 232 lb (105.235 kg)  BMI 38.61 kg/m2 Well nourished, well developed, in no acute distress HEENT: normal Neck: no JVD Cardiac:  normal S1, S2; irregularly irregular rhythm; no murmur Lungs:  clear to auscultation bilaterally, no wheezing, rhonchi or rales Abd: soft, nontender, no hepatomegaly Ext: no edema Skin: warm and dry Neuro:  CNs 2-12 intact, no focal abnormalities noted  EKG:   Atrial fibrillation, HR 97  ASSESSMENT AND PLAN:  1. Atrial Fibrillation:  She remains in AFib.  HR is better controlled. Her INR has  remained therapeutic for > 3 weeks.  I reviewed with Dr. Tobias Alexander (DOD).  We will arrange DCCV next week.  She had a visit for INR check 12/06/2013.   2. Cardiomyopathy:  Likely tachycardia mediated.  Continue current dose of beta blocker.  Will try to add low dose ACEI to see if she can tolerate it.  Start Lisinopril 2.5 mg QD.  Check BMET in 1 week.  3. Diabetes Mellitus:  Follow up with primary care.  4. Hx of Thyroid Disorder:  Recent TSH normal. 5. Disposition:  Follow up with me or Dr. Delane Ginger in 2-3 weeks (after DCCV).    Signed, Tereso Newcomer, PA-C  11/29/2013 9:03 AM

## 2013-12-06 ENCOUNTER — Ambulatory Visit (INDEPENDENT_AMBULATORY_CARE_PROVIDER_SITE_OTHER): Payer: BC Managed Care – PPO | Admitting: Pharmacist

## 2013-12-06 DIAGNOSIS — I4891 Unspecified atrial fibrillation: Secondary | ICD-10-CM

## 2013-12-06 DIAGNOSIS — Z7901 Long term (current) use of anticoagulants: Secondary | ICD-10-CM

## 2013-12-06 LAB — POCT INR: INR: 2.3

## 2013-12-07 ENCOUNTER — Encounter (HOSPITAL_COMMUNITY)
Admission: RE | Disposition: A | Discharge status: Home / Self Care | Payer: Self-pay | Source: Ambulatory Visit | Attending: Interventional Cardiology

## 2013-12-07 ENCOUNTER — Encounter (HOSPITAL_COMMUNITY): Payer: BC Managed Care – PPO | Admitting: Anesthesiology

## 2013-12-07 ENCOUNTER — Ambulatory Visit (HOSPITAL_COMMUNITY): Payer: BC Managed Care – PPO | Admitting: Anesthesiology

## 2013-12-07 ENCOUNTER — Encounter (HOSPITAL_COMMUNITY): Payer: Self-pay

## 2013-12-07 ENCOUNTER — Ambulatory Visit (HOSPITAL_COMMUNITY)
Admission: RE | Admit: 2013-12-07 | Discharge: 2013-12-07 | Disposition: A | Discharge status: Home / Self Care | Payer: BC Managed Care – PPO | Source: Ambulatory Visit | Attending: Interventional Cardiology | Admitting: Interventional Cardiology

## 2013-12-07 DIAGNOSIS — I428 Other cardiomyopathies: Secondary | ICD-10-CM | POA: Insufficient documentation

## 2013-12-07 DIAGNOSIS — Z6838 Body mass index (BMI) 38.0-38.9, adult: Secondary | ICD-10-CM | POA: Insufficient documentation

## 2013-12-07 DIAGNOSIS — E039 Hypothyroidism, unspecified: Secondary | ICD-10-CM | POA: Insufficient documentation

## 2013-12-07 DIAGNOSIS — E669 Obesity, unspecified: Secondary | ICD-10-CM | POA: Insufficient documentation

## 2013-12-07 DIAGNOSIS — Z7901 Long term (current) use of anticoagulants: Secondary | ICD-10-CM | POA: Insufficient documentation

## 2013-12-07 DIAGNOSIS — E785 Hyperlipidemia, unspecified: Secondary | ICD-10-CM | POA: Insufficient documentation

## 2013-12-07 DIAGNOSIS — E119 Type 2 diabetes mellitus without complications: Secondary | ICD-10-CM | POA: Insufficient documentation

## 2013-12-07 DIAGNOSIS — I4891 Unspecified atrial fibrillation: Secondary | ICD-10-CM | POA: Insufficient documentation

## 2013-12-07 DIAGNOSIS — Z87891 Personal history of nicotine dependence: Secondary | ICD-10-CM | POA: Insufficient documentation

## 2013-12-07 HISTORY — PX: CARDIOVERSION: SHX1299

## 2013-12-07 LAB — GLUCOSE, CAPILLARY: GLUCOSE-CAPILLARY: 148 mg/dL — AB (ref 70–99)

## 2013-12-07 SURGERY — CARDIOVERSION
Anesthesia: Monitor Anesthesia Care

## 2013-12-07 MED ORDER — PROPOFOL 10 MG/ML IV BOLUS
INTRAVENOUS | Status: DC | PRN
  Administered 2013-12-07: 50 mg via INTRAVENOUS
  Administered 2013-12-07: 30 mg via INTRAVENOUS
  Administered 2013-12-07 (×2): 50 mg via INTRAVENOUS

## 2013-12-07 MED ORDER — SODIUM CHLORIDE 0.9 % IV SOLN
INTRAVENOUS | Status: DC | PRN
  Administered 2013-12-07: 14:00:00 via INTRAVENOUS

## 2013-12-07 MED ORDER — LIDOCAINE HCL (CARDIAC) 20 MG/ML IV SOLN
INTRAVENOUS | Status: DC | PRN
  Administered 2013-12-07: 30 mg via INTRAVENOUS

## 2013-12-07 MED ORDER — SODIUM CHLORIDE 0.9 % IV SOLN
INTRAVENOUS | Status: DC
  Administered 2013-12-07: 13:00:00 via INTRAVENOUS

## 2013-12-07 NOTE — Interval H&P Note (Signed)
History and Physical Interval Note:  12/07/2013 2:02 PM  Kelly Lang  has presented today for surgery, with the diagnosis of AFIB  The various methods of treatment have been discussed with the patient and family. After consideration of risks, benefits and other options for treatment, the patient has consented to  Procedure(s): CARDIOVERSION (N/A) as a surgical intervention .  The patient's history has been reviewed, patient examined, no change in status, stable for surgery.  I have reviewed the patient's chart and labs.  Questions were answered to the patient's satisfaction.     Chinwe Lope S.

## 2013-12-07 NOTE — Discharge Instructions (Signed)
Monitored Anesthesia Care  Monitored anesthesia care is an anesthesia service for a medical procedure. Anesthesia is the loss of the ability to feel pain. It is produced by medications called anesthetics. It may affect a small area of your body (local anesthesia), a large area of your body (regional anesthesia), or your entire body (general anesthesia). The need for monitored anesthesia care depends your procedure, your condition, and the potential need for regional or general anesthesia. It is often provided during procedures where:   General anesthesia may be needed if there are complications. This is because you need special care when you are under general anesthesia.   You will be under local or regional anesthesia. This is so that you are able to have higher levels of anesthesia if needed.   You will receive calming medications (sedatives). This is especially the case if sedatives are given to put you in a semi-conscious state of relaxation (deep sedation). This is because the amount of sedative needed to produce this state can be hard to predict. Too much of a sedative can produce general anesthesia. Monitored anesthesia care is performed by one or more caregivers who have special training in all types of anesthesia. You will need to meet with these caregivers before your procedure. During this meeting, they will ask you about your medical history. They will also give you instructions to follow. (For example, you will need to stop eating and drinking before your procedure. You may also need to stop or change medications you are taking.) During your procedure, your caregivers will stay with you. They will:   Watch your condition. This includes watching you blood pressure, breathing, and level of pain.   Diagnose and treat problems that occur.   Give medications if they are needed. These may include calming medications (sedatives) and anesthetics.   Make sure you are comfortable.  Having  monitored anesthesia care does not necessarily mean that you will be under anesthesia. It does mean that your caregivers will be able to manage anesthesia if you need it or if it occurs. It also means that you will be able to have a different type of anesthesia than you are having if you need it. When your procedure is complete, your caregivers will continue to watch your condition. They will make sure any medications wear off before you are allowed to go home.  Document Released: 08/13/2005 Document Revised: 03/14/2013 Document Reviewed: 12/29/2012 [facility] Patient Information 2014 [place], [place]. Electrical Cardioversion Cardioversion is the delivery of a jolt of electricity to change the rhythm of the heart. Sticky patches or metal paddles are placed on the chest to deliver the electricity from a device. This is done to restore a normal rhythm. A rhythm that is too fast or not regular keeps the heart from pumping well. Compared to medicines used to change an abnormal rhythm (arrhythmia), cardioversion is faster and works better.  WHEN WOULD THIS BE DONE?  In an emergency:  There is low or no blood pressure as a result of the heart rhythm.  Normal rhythm must be restored as fast as possible to protect the brain and heart from further damage.  It may save a life.  For less serious heart rhythms, such as atrial fibrillation or flutter, in which:  The heart is beating too fast or is not regular.  The heart is still able to pump enough blood, but not as well as it should.  Medicine to change the rhythm has not worked.  It is  safe to wait in order to allow time for preparation. LET YOUR CAREGIVER KNOW ABOUT:   Medicines taken, including herbs, eyedrops, over-the-counter medicines, and creams.  History of abnormal heartbeats   RISKS AND COMPLICATIONS  Clots may form in your heart if you have an arrhythmia. In rare cases, cardioversion can cause a clot to break free and travel to other  parts of the body. This could cause a stroke or other problems.  BEFORE THE PROCEDURE   You may have tests to detect blood clots in your heart and evaluate heart function.  You may start taking blood thinners (anticoagulants) so your blood does not clot as easily.  Other medicines may be given to help your heart work better. PROCEDURE (SCHEDULED)  You will be given medicine through an intravenous (IV) access to reduce discomfort and make you sleepy (sedative).  Your whole body may move when the shock is delivered. Your chest may feel sore.  You may be able to go home after a few hours. Your heart rhythm will be watched to make sure it does not change. HOME CARE INSTRUCTIONS   Only take medicine as directed by your caregiver. Be sure you understand how and when to take your medicine.  Learn how to feel your pulse and check it often.  Limit your activity for 48 hours.  Avoid caffeine and other stimulants as directed. SEEK MEDICAL CARE IF:   You feel like your heart is beating too fast or your pulse is not regular.  You have any questions about your medicines.  You have bleeding that will not stop. SEEK IMMEDIATE MEDICAL CARE IF:   You are dizzy or feel faint.  It is hard to breathe or you feel short of breath.  There is a change in discomfort in your chest.  Your speech is slurred or you have trouble moving your arm or leg on one side.  You get a muscle cramp.  Your fingers or toes turn cold or blue. MAKE SURE YOU:   Understand these instructions.  Will watch your condition.  Will get help right away if you are not doing well or get worse. Document Released: 11/07/2002 Document Revised: 03/14/2013 Document Reviewed: 06/01/2013 [facility] Patient Information 2014 [place], [place].

## 2013-12-07 NOTE — CV Procedure (Signed)
Electrical Cardioversion Procedure Note Kelly Lang [id number] Mar 13, 1961  Procedure: Electrical Cardioversion Indications:  Atrial Fibrillation  Time Out: Verified patient identification, verified procedure,medications/allergies/relevent history reviewed, required imaging and test results available.  Performed  Procedure Details  The patient was NPO after midnight. Anesthesia was administered at the beside  by Dr. Jean Lang with 100 mg of propofol and 30 mg Lidocaine.    Cardioversion was done with synchronized biphasic defibrillation with AP pads with 120 J.  The patient converted to normal sinus rhythm. After about 90 seconds, she reverted to AFib.  Another 120 J biphasic defibrillation was given after another 80 mg of propofol.  This was also successful at restoring NSR for about a minute.  AFib returned after a minute. The patient tolerated the procedure well   IMPRESSION:  Successful cardioversion of atrial fibrillation to NSR twice, with early recurrence of atrial fibrillation both times.  The patient will leave in rate controlled atrial fibrillation.    1) Continue Coumadin. 2) Consider antiarrhythmic drug to help maintain NSR.  Repeat cardioversion attempt after drug load.    Kelly Lang S. 12/07/2013, 2:06 PM

## 2013-12-07 NOTE — Anesthesia Postprocedure Evaluation (Signed)
  Anesthesia Post-op Note  Patient: Kelly Lang  Procedure(s) Performed: Procedure(s): CARDIOVERSION (N/A)  Patient Location: Endoscopy Unit  Anesthesia Type:General  Level of Consciousness: awake, alert  and oriented  Airway and Oxygen Therapy: Patient Spontanous Breathing and Patient connected to nasal cannula oxygen  Post-op Pain: none  Post-op Assessment: Post-op Vital signs reviewed, Patient's Cardiovascular Status Stable, Respiratory Function Stable, Patent Airway, No signs of Nausea or vomiting and Pain level controlled  Post-op Vital Signs: Reviewed and stable  Complications: No apparent anesthesia complications

## 2013-12-07 NOTE — Preoperative (Signed)
Beta Blockers   Reason not to administer Beta Blockers:Not Applicable 

## 2013-12-07 NOTE — Anesthesia Preprocedure Evaluation (Addendum)
Anesthesia Evaluation  Patient identified by MRN, date of birth, ID band Patient awake    Reviewed: Allergy & Precautions, H&P , NPO status , Patient's Chart, lab work & pertinent test results, reviewed documented beta blocker date and time   History of Anesthesia Complications Negative for: history of anesthetic complications  Airway Mallampati: II TM Distance: >3 FB Neck ROM: Full    Dental  (+) Dental Advisory Given and Poor Dentition   Pulmonary former smoker (quit '12),  breath sounds clear to auscultation  Pulmonary exam normal       Cardiovascular hypertension, Pt. on medications and Pt. on home beta blockers + dysrhythmias (INR 2.3) Rhythm:Irregular Rate:Normal  11/14 ECHO: EF 40-45%   Neuro/Psych    GI/Hepatic negative GI ROS, Neg liver ROS,   Endo/Other  diabetes (glu 148), Oral Hypoglycemic AgentsMorbid obesity  Renal/GU negative Renal ROS     Musculoskeletal   Abdominal (+) + obese,   Peds  Hematology   Anesthesia Other Findings   Reproductive/Obstetrics                        Anesthesia Physical Anesthesia Plan  ASA: III  Anesthesia Plan: General   Post-op Pain Management:    Induction: Intravenous  Airway Management Planned: Mask and Natural Airway  Additional Equipment:   Intra-op Plan:   Post-operative Plan:   Informed Consent: I have reviewed the patients History and Physical, chart, labs and discussed the procedure including the risks, benefits and alternatives for the proposed anesthesia with the patient or authorized representative who has indicated his/her understanding and acceptance.   Dental advisory given  Plan Discussed with: Surgeon and CRNA  Anesthesia Plan Comments: (Plan routine monitors, GA for cardioversion)        Anesthesia Quick Evaluation

## 2013-12-07 NOTE — Transfer of Care (Signed)
Immediate Anesthesia Transfer of Care Note  Patient: Kelly Lang  Procedure(s) Performed: Procedure(s): CARDIOVERSION (N/A)  Patient Location: Endoscopy Unit  Anesthesia Type:MAC  Level of Consciousness: awake, alert  and oriented  Airway & Oxygen Therapy: Patient Spontanous Breathing and Patient connected to nasal cannula oxygen  Post-op Assessment: Report given to PACU RN and Post -op Vital signs reviewed and stable  Post vital signs: Reviewed and stable  Complications: No apparent anesthesia complications

## 2013-12-08 ENCOUNTER — Encounter (HOSPITAL_COMMUNITY): Payer: Self-pay | Admitting: Interventional Cardiology

## 2013-12-14 ENCOUNTER — Ambulatory Visit (INDEPENDENT_AMBULATORY_CARE_PROVIDER_SITE_OTHER): Payer: BC Managed Care – PPO | Admitting: Physician Assistant

## 2013-12-14 ENCOUNTER — Ambulatory Visit: Payer: Self-pay | Admitting: Physician Assistant

## 2013-12-14 ENCOUNTER — Ambulatory Visit (INDEPENDENT_AMBULATORY_CARE_PROVIDER_SITE_OTHER): Payer: BC Managed Care – PPO | Admitting: Pharmacist

## 2013-12-14 ENCOUNTER — Encounter: Payer: Self-pay | Admitting: Physician Assistant

## 2013-12-14 VITALS — BP 120/86 | HR 100 | Ht 65.0 in | Wt 231.0 lb

## 2013-12-14 DIAGNOSIS — R000 Tachycardia, unspecified: Secondary | ICD-10-CM

## 2013-12-14 DIAGNOSIS — I4891 Unspecified atrial fibrillation: Secondary | ICD-10-CM

## 2013-12-14 DIAGNOSIS — Z7901 Long term (current) use of anticoagulants: Secondary | ICD-10-CM

## 2013-12-14 DIAGNOSIS — I43 Cardiomyopathy in diseases classified elsewhere: Secondary | ICD-10-CM

## 2013-12-14 LAB — POCT INR: INR: 3.1

## 2013-12-14 MED ORDER — METOPROLOL TARTRATE 50 MG PO TABS: 75.0000 mg | ORAL_TABLET | Freq: Two times a day (BID) | ORAL | Status: DC

## 2013-12-14 NOTE — Progress Notes (Signed)
[address] [place], [place]  [address] Phone: [phone] Fax:  [phone]  Date:  12/14/2013   ID:  Kelly Lang, DOB [birth year], MRN [id number]  PCP:  Doris CheadleADVANI, DEEPAK, MD  Cardiologist:  Dr. Delane GingerPhil Nahser    History of Present Illness: Kelly Lang is a 53 y.o. female with a hx of T2DM, HL, thyroid disease and atrial fibrillation.  She had been on Synthroid for some time after a thyroid bx in 1996.  She was admitted 10/2013 with AFib with RVR.  Echo (10/26/13):  EF 40-45%, normal wall motion, mild LAE.  CHADS2-VASc= 3 (LV dysfunction, DM, female).  She was placed on coumadin.  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  She was last seen 11/29/2013.  She was set up for DCCV after 3-4 weeks of appropriate anticoagulation.  DCCV was performed 12/07/2013 but was unsuccessful.  She returns for follow up.  She is stable. She is frustrated with her atrial fibrillation. She does get tired more easily. She denies chest pain. She denies significant dyspnea. She is NYHA class II. She denies syncope. She denies orthopnea, PND, edema, palpitations.   Recent Labs: 10/25/2013: ALT 20; TSH 1.020  10/26/2013: HDL Cholesterol 29*; LDL (calc) 99  11/29/2013: Creatinine 0.7; Hemoglobin 14.3; Potassium 4.2  12/14/2013: POC INR 3.1   Wt Readings from Last 3 Encounters:  12/14/13 231 lb (104.781 kg)  11/29/13 232 lb (105.235 kg)  11/09/13 231 lb (104.781 kg)     Past Medical History  Diagnosis Date  . Diabetes mellitus     a. hg A1c 10, newly diagnosed (10/26/13)  . Obesity   . H/O: hypothyroidism     a. thyroid biopsy 1996 with synthroid. Stopped taking rx and was loss to follow up b. normal thyroid function 10/20/13  . Arthritis   . Atrial fibrillation with RVR     a diagnosed 10/25/2013  . Tachycardia induced cardiomyopathy     a. 2/2 Afib with RVR for unknown duration (EF: 40-45%)  . Dyslipidemia     Current Outpatient Prescriptions    Medication Sig Dispense Refill  . acetaminophen (TYLENOL) 500 MG tablet Take 500 mg by mouth every 6 (six) hours as needed.      Marland Kitchen. lisinopril (PRINIVIL,ZESTRIL) 2.5 MG tablet Take 1 tablet (2.5 mg total) by mouth daily.  30 tablet  6  . metFORMIN (GLUCOPHAGE) 500 MG tablet Take 1 tablet (500 mg total) by mouth 2 (two) times daily with a meal.  60 tablet  3  . metoprolol tartrate (LOPRESSOR) 50 MG tablet Take 1 tablet (50 mg total) by mouth 2 (two) times daily.  60 tablet  6  . warfarin (COUMADIN) 5 MG tablet Take 5-7.5 mg by mouth daily. 5mg  (1 tablet) on Monday, Wednesday, Friday and Saturday; 7.5 mg (1.5 tablets) on Tuesday, Thursday, and Sunday       No current facility-administered medications for this visit.    Allergies:   Codeine   Social History:  The patient  reports that she quit smoking about 3 years ago. Her smoking use included Cigarettes. She has a 22 pack-year smoking history. She has never used smokeless tobacco. She reports that she drinks alcohol. She reports that she does not use illicit drugs.   Family History:  The patient's family history is not on file.   ROS:  Please see the history of present illness.   She denies any bleeding problems.   All other systems reviewed and negative.  PHYSICAL EXAM: VS:  BP 120/86  Pulse 100  Ht 5\' 5"  (1.651 m)  Wt 231 lb (104.781 kg)  BMI 38.44 kg/m2  LMP 11/17/2013 Well nourished, well developed, in no acute distress HEENT: normal Neck: no JVD Cardiac:  normal S1, S2; irregularly irregular rhythm; no murmur Lungs:  clear to auscultation bilaterally, no wheezing, rhonchi or rales Abd: soft, nontender, no hepatomegaly Ext: no edema Skin: warm and dry Neuro:  CNs 2-12 intact, no focal abnormalities noted  EKG:    Atrial fibrillation, HR 100  ASSESSMENT AND PLAN:  1. Atrial Fibrillation:  Marginal rate control. She remains in atrial fibrillation. She remains on Coumadin. She likely has tachycardia mediated cardiomyopathy.  She also appears to be symptomatic with atrial fibrillation. Therefore, we should pursue a rhythm control strategy. I would prefer to use a class IC drug. However, we need to rule out the possibility of ischemic heart disease as she is a diabetic. I will arrange an ETT-Myoview. I will increase her metoprolol to 75 mg twice a day for better rate control. She will be brought back in follow up to determine which type of antiarrhythmic drug to use to maintain sinus rhythm.  Question if she has sleep apnea.  She was set up for a sleep test years ago.  She was placed on a CPAP prior to testing but could not tolerate.  I have asked her to review with her family to see if she snores or if there are witnessed apneic episodes.  If so, I would refer to pulmonology to further evaluate for sleep apnea.   2. Cardiomyopathy:  Likely tachycardia mediated.  Continue beta blocker, ACEI.  We can see what her EF is on her nuclear study.  Check follow up BMET.  3. Hx of Thyroid Disorder:  Recent TSH normal. 4. Disposition:  Follow up with Dr. Delane Ginger in 3-4 weeks.    Signed, Tereso Newcomer, PA-C  12/14/2013 3:57 PM

## 2013-12-14 NOTE — Patient Instructions (Signed)
INCREASE METOPROLOL TO 75 MG TWICE DAILY; REFILL SENT IN WITH NEW DIRECTIONS  LAB WORK TODAY; BMET  PLEASE SCHEDULE FOR AND EXERCISE MYOVIEW  PLEASE FOLLOW UP WITH DR. Meta Hatchet IN 3-4 WEEKS

## 2013-12-15 LAB — BASIC METABOLIC PANEL
BUN: 16 mg/dL (ref 6–23)
CO2: 26 mEq/L (ref 19–32)
Calcium: 9 mg/dL (ref 8.4–10.5)
Chloride: 105 mEq/L (ref 96–112)
Creatinine, Ser: 0.7 mg/dL (ref 0.4–1.2)
GFR: 97.05 mL/min (ref >60.00)
GLUCOSE: 148 mg/dL — AB (ref 70–99)
Potassium: 4.3 mEq/L (ref 3.5–5.1)
SODIUM: 137 meq/L (ref 135–145)

## 2013-12-20 ENCOUNTER — Other Ambulatory Visit: Payer: Self-pay | Admitting: Internal Medicine

## 2013-12-20 ENCOUNTER — Ambulatory Visit (HOSPITAL_COMMUNITY)
Admission: RE | Admit: 2013-12-20 | Discharge: 2013-12-20 | Disposition: A | Discharge status: Home / Self Care | Payer: BC Managed Care – PPO | Source: Ambulatory Visit | Attending: Internal Medicine | Admitting: Internal Medicine

## 2013-12-20 DIAGNOSIS — Z1231 Encounter for screening mammogram for malignant neoplasm of breast: Secondary | ICD-10-CM | POA: Insufficient documentation

## 2013-12-20 DIAGNOSIS — Z139 Encounter for screening, unspecified: Secondary | ICD-10-CM

## 2013-12-22 ENCOUNTER — Ambulatory Visit (INDEPENDENT_AMBULATORY_CARE_PROVIDER_SITE_OTHER): Payer: BC Managed Care – PPO | Admitting: *Deleted

## 2013-12-22 DIAGNOSIS — I4891 Unspecified atrial fibrillation: Secondary | ICD-10-CM

## 2013-12-22 DIAGNOSIS — Z7901 Long term (current) use of anticoagulants: Secondary | ICD-10-CM

## 2013-12-22 DIAGNOSIS — Z5181 Encounter for therapeutic drug level monitoring: Secondary | ICD-10-CM

## 2013-12-22 LAB — POCT INR: INR: 3.2

## 2013-12-28 ENCOUNTER — Encounter: Payer: Self-pay | Admitting: Cardiology

## 2013-12-28 ENCOUNTER — Ambulatory Visit (HOSPITAL_COMMUNITY): Payer: BC Managed Care – PPO | Attending: Cardiology | Admitting: Radiology

## 2013-12-28 VITALS — BP 105/82 | Ht 65.0 in | Wt 234.0 lb

## 2013-12-28 DIAGNOSIS — I428 Other cardiomyopathies: Secondary | ICD-10-CM | POA: Insufficient documentation

## 2013-12-28 DIAGNOSIS — E119 Type 2 diabetes mellitus without complications: Secondary | ICD-10-CM | POA: Insufficient documentation

## 2013-12-28 DIAGNOSIS — E785 Hyperlipidemia, unspecified: Secondary | ICD-10-CM | POA: Insufficient documentation

## 2013-12-28 DIAGNOSIS — R002 Palpitations: Secondary | ICD-10-CM | POA: Insufficient documentation

## 2013-12-28 DIAGNOSIS — R5383 Other fatigue: Secondary | ICD-10-CM | POA: Insufficient documentation

## 2013-12-28 DIAGNOSIS — I10 Essential (primary) hypertension: Secondary | ICD-10-CM | POA: Insufficient documentation

## 2013-12-28 DIAGNOSIS — R000 Tachycardia, unspecified: Secondary | ICD-10-CM | POA: Insufficient documentation

## 2013-12-28 DIAGNOSIS — I4891 Unspecified atrial fibrillation: Secondary | ICD-10-CM | POA: Insufficient documentation

## 2013-12-28 DIAGNOSIS — R5381 Other malaise: Secondary | ICD-10-CM | POA: Insufficient documentation

## 2013-12-28 DIAGNOSIS — R0602 Shortness of breath: Secondary | ICD-10-CM | POA: Insufficient documentation

## 2013-12-28 DIAGNOSIS — Z87891 Personal history of nicotine dependence: Secondary | ICD-10-CM | POA: Insufficient documentation

## 2013-12-28 MED ORDER — TECHNETIUM TC 99M SESTAMIBI GENERIC - CARDIOLITE
30.0000 | Freq: Once | INTRAVENOUS | Status: AC | PRN
  Administered 2013-12-28: 30 via INTRAVENOUS

## 2013-12-28 MED ORDER — TECHNETIUM TC 99M SESTAMIBI GENERIC - CARDIOLITE
10.0000 | Freq: Once | INTRAVENOUS | Status: AC | PRN
  Administered 2013-12-28: 10 via INTRAVENOUS

## 2013-12-28 MED ORDER — REGADENOSON 0.4 MG/5ML IV SOLN
0.4000 mg | Freq: Once | INTRAVENOUS | Status: AC
  Administered 2013-12-28: 0.4 mg via INTRAVENOUS

## 2013-12-28 NOTE — Progress Notes (Signed)
[facility] SITE 3 NUCLEAR MED [address] [place], [place] [address] [phone]    Cardiology Nuclear Med Study  Kelly Lang is a 53 y.o. female     MRN : [id number]     DOB: 07/25/1961  Procedure Date: 12/28/2013  Nuclear Med Background Indication for Stress Test:  Evaluation for Ischemia History:  11/14 ECHO: EF: 40-45%, AFIB RVR 1/15 Cardioversion tachycardia related cardiomyopathy Cardiac Risk Factors: History of Smoking, Hypertension, Lipids and NIDDM  Symptoms:  Fatigue, Palpitations and SOB   Nuclear Pre-Procedure Caffeine/Decaff Intake:  None NPO After: 10:00pm   Lungs:  clear O2 Sat: 97% on room air. IV 0.9% NS with Angio Cath:  22g  IV Site: R Hand  IV Started by:  Irean Hong, RN  Chest Size (in):  42 Cup Size: D  Height: 5\' 5"  (1.651 m)  Weight:  234 lb (106.142 kg)  BMI:  Body mass index is 38.94 kg/(m^2). Tech Comments:  Took Lopressor this AM    Nuclear Med Study 1 or 2 day study: 1 day  Stress Test Type:  Treadmill/Lexiscan  Reading MD: N/A  Order Authorizing Provider:  Kristeen Miss, MD  Resting Radionuclide: Technetium 16m Sestamibi  Resting Radionuclide Dose: 11.0 mCi   Stress Radionuclide:  Technetium 56m Sestamibi  Stress Radionuclide Dose: 33.0 mCi           Stress Protocol Rest HR: 82 Stress HR: 117  Rest BP: 105/82 Stress BP: 125/96  Exercise Time (min): 6:00 METS: 7.10   Predicted Max HR: 170 bpm % Max HR: 68.82 bpm Rate Pressure Product: 50569   Dose of Adenosine (mg):  n/a Dose of Lexiscan: 0.4 mg  Dose of Atropine (mg): n/a Dose of Dobutamine: n/a mcg/kg/min (at max HR)  Stress Test Technologist: Milana Na, EMT-P  Nuclear Technologist:  Domenic Polite, CNMT     Rest Procedure:  Myocardial perfusion imaging was performed at rest 45 minutes following the intravenous administration of Technetium 89m Sestamibi. Rest ECG: AFIB, NSSTW changes.   Stress Procedure:  The patient received IV Lexiscan 0.4 mg over  15-seconds with concurrent low level exercise and then Technetium 5m Sestamibi was injected at 30-seconds while the patient continued walking one more minute. This patient had sob and fatigue with the Lexiscan injection. Quantitative spect images were obtained after a 45-minute delay. Stress ECG: No significant change from baseline ECG  QPS Raw Data Images:  Normal; no motion artifact; normal heart/lung ratio. Mild diaphragmatic attenuation.  Stress Images:  Normal homogeneous uptake in all areas of the myocardium. Rest Images:  Normal homogeneous uptake in all areas of the myocardium. Subtraction (SDS):  No evidence of ischemia. Transient Ischemic Dilatation (Normal <1.22):  0.98 Lung/Heart Ratio (Normal <0.45):  0.38  Quantitative Gated Spect Images QGS EDV:  n/a ml QGS ESV:  n/a ml  Impression Exercise Capacity:  Poor exercise capacity. BP Response:  Normal blood pressure response.Changed to Lexiscan.  Clinical Symptoms:  No significant symptoms noted. ECG Impression:  No significant ST segment change suggestive of ischemia. Comparison with Prior Nuclear Study: No images to compare  Overall Impression:  Low risk stress nuclear study with no significant areas of ischemia. .  LV Ejection Fraction: Study not gated.  LV Wall Motion:  Non gated. Atrial fibrillation noted through study.     Donato Schultz, MD

## 2013-12-29 ENCOUNTER — Encounter: Payer: Self-pay | Admitting: Physician Assistant

## 2013-12-30 ENCOUNTER — Ambulatory Visit (INDEPENDENT_AMBULATORY_CARE_PROVIDER_SITE_OTHER): Payer: BC Managed Care – PPO | Admitting: *Deleted

## 2013-12-30 DIAGNOSIS — Z7901 Long term (current) use of anticoagulants: Secondary | ICD-10-CM

## 2013-12-30 DIAGNOSIS — Z5181 Encounter for therapeutic drug level monitoring: Secondary | ICD-10-CM

## 2013-12-30 DIAGNOSIS — I4891 Unspecified atrial fibrillation: Secondary | ICD-10-CM

## 2013-12-30 LAB — POCT INR: INR: 2.8

## 2014-01-16 ENCOUNTER — Encounter: Payer: Self-pay | Admitting: Cardiovascular Disease

## 2014-01-16 ENCOUNTER — Ambulatory Visit (INDEPENDENT_AMBULATORY_CARE_PROVIDER_SITE_OTHER): Payer: BC Managed Care – PPO | Admitting: *Deleted

## 2014-01-16 ENCOUNTER — Ambulatory Visit: Payer: BC Managed Care – PPO | Attending: Internal Medicine

## 2014-01-16 ENCOUNTER — Ambulatory Visit (INDEPENDENT_AMBULATORY_CARE_PROVIDER_SITE_OTHER): Payer: BC Managed Care – PPO | Admitting: Cardiovascular Disease

## 2014-01-16 VITALS — BP 110/80 | HR 62 | Ht 65.0 in | Wt 231.0 lb

## 2014-01-16 DIAGNOSIS — Z5181 Encounter for therapeutic drug level monitoring: Secondary | ICD-10-CM

## 2014-01-16 DIAGNOSIS — I4891 Unspecified atrial fibrillation: Secondary | ICD-10-CM

## 2014-01-16 DIAGNOSIS — Z7901 Long term (current) use of anticoagulants: Secondary | ICD-10-CM

## 2014-01-16 DIAGNOSIS — E119 Type 2 diabetes mellitus without complications: Secondary | ICD-10-CM

## 2014-01-16 DIAGNOSIS — Z139 Encounter for screening, unspecified: Secondary | ICD-10-CM

## 2014-01-16 LAB — POCT INR
INR: 2
INR: 2

## 2014-01-16 LAB — POCT GLYCOSYLATED HEMOGLOBIN (HGB A1C): Hemoglobin A1C: 6.9

## 2014-01-16 MED ORDER — FLECAINIDE ACETATE 100 MG PO TABS: 100.0000 mg | ORAL_TABLET | Freq: Two times a day (BID) | ORAL | Status: DC

## 2014-01-16 MED ORDER — METOPROLOL TARTRATE 50 MG PO TABS: 50.0000 mg | ORAL_TABLET | Freq: Two times a day (BID) | ORAL | Status: DC

## 2014-01-16 NOTE — Progress Notes (Signed)
[address] [place], [place]  [address] Phone: [phone] Fax:  [phone]  Date:  01/16/2014   ID:  Kelly Lang, DOB Aug 23, 1961, MRN [id number]  PCP:  Doris CheadleADVANI, DEEPAK, MD  Cardiologist:  Dr. Delane GingerPhil Nahser    Problem List: 1. Atrial fibrillation 2. Hypothyroidism 3. Type 2 diabetes mellitus  History of Present Illness: Kelly Lang is a 53 y.o. female with a hx of T2DM, HL, thyroid disease and atrial fibrillation.  She had been on Synthroid for some time after a thyroid bx in 1996.  She was admitted 10/2013 with AFib with RVR.  Echo (10/26/13):  EF 40-45%, normal wall motion, mild LAE.  CHADS2-VASc= 3 (LV dysfunction, DM, female).  She was placed on coumadin.  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  She was last seen 11/29/2013.  She was set up for DCCV after 3-4 weeks of appropriate anticoagulation.  DCCV was performed 12/07/2013 but was unsuccessful.  She returns for follow up.  She is stable. She is frustrated with her atrial fibrillation. She does get tired more easily. She denies chest pain. She denies significant dyspnea. She is NYHA class II. She denies syncope. She denies orthopnea, PND, edema, palpitations.  Feb. 16, 2015:  She had a cardioversion twice.  Converted back to Afib both times. She has had a stress myoview which was negative for ischemia.    we will start flecainide today.   Recent Labs: 10/25/2013: ALT 20; TSH 1.020  10/26/2013: HDL Cholesterol 29*; LDL (calc) 99  11/29/2013: Hemoglobin 14.3  12/14/2013: Creatinine 0.7; Potassium 4.3  01/16/2014: POC INR 2.0   Wt Readings from Last 3 Encounters:  01/16/14 231 lb (104.781 kg)  12/28/13 234 lb (106.142 kg)  12/14/13 231 lb (104.781 kg)     Past Medical History  Diagnosis Date  . Diabetes mellitus     a. hg A1c 10, newly diagnosed (10/26/13)  . Obesity   . H/O: hypothyroidism     a. thyroid biopsy 1996 with synthroid. Stopped taking rx and was loss to  follow up b. normal thyroid function 10/20/13  . Arthritis   . Atrial fibrillation with RVR     a diagnosed 10/25/2013  . Tachycardia induced cardiomyopathy     a. 2/2 Afib with RVR for unknown duration (EF: 40-45%)  . Dyslipidemia   . Hx of cardiovascular stress test     ETT/Lexiscan Myoview (12/2013):  No ischemia; not gated; low risk.    Current Outpatient Prescriptions  Medication Sig Dispense Refill  . acetaminophen (TYLENOL) 500 MG tablet Take 500 mg by mouth every 6 (six) hours as needed.      Marland Kitchen. lisinopril (PRINIVIL,ZESTRIL) 2.5 MG tablet Take 1 tablet (2.5 mg total) by mouth daily.  30 tablet  6  . metFORMIN (GLUCOPHAGE) 500 MG tablet Take 1 tablet (500 mg total) by mouth 2 (two) times daily with a meal.  60 tablet  3  . metoprolol (LOPRESSOR) 50 MG tablet Take 1.5 tablets (75 mg total) by mouth 2 (two) times daily.  90 tablet  11  . warfarin (COUMADIN) 5 MG tablet Take 5-7.5 mg by mouth daily. 5mg  (1 tablet) on Monday, Wednesday, Friday and Saturday; 7.5 mg (1.5 tablets) on Tuesday, Thursday, and Sunday       No current facility-administered medications for this visit.    Allergies:   Codeine   Social History:  The patient  reports that she quit smoking about 3 years ago. Her smoking  use included Cigarettes. She has a 22 pack-year smoking history. She has never used smokeless tobacco. She reports that she drinks alcohol. She reports that she does not use illicit drugs.   Family History:  The patient's family history is not on file.   ROS:  Please see the history of present illness.   She denies any bleeding problems.   All other systems reviewed and negative.   PHYSICAL EXAM: VS:  BP 110/80  Pulse 62  Ht 5\' 5"  (1.651 m)  Wt 231 lb (104.781 kg)  BMI 38.44 kg/m2 Well nourished, well developed, in no acute distress HEENT: normal Neck: no JVD Cardiac:  normal S1, S2; irregularly irregular rhythm; no murmur Lungs:  clear to auscultation bilaterally, no wheezing, rhonchi or  rales Abd: soft, nontender, no hepatomegaly Ext: no edema Skin: warm and dry Neuro:  CNs 2-12 intact, no focal abnormalities noted  EKG:    Atrial fibrillation, HR 100  ASSESSMENT AND PLAN:

## 2014-01-16 NOTE — Assessment & Plan Note (Signed)
Next he remains in atrial fibrillation. She had a Myoview study which was negative for ischemia. It was read out as a low risk study.  We'll start her on Flecainide 100 mg BID.    We'll decrease the metoprolol to 50 mg twice a day. We'll see her back in approximately one month and sent for cardioversion at that time.  I have given her the names of the NOACS (Pradaxa, Xarelto, Eliquis) to see if they are covered on her formulary.   She will let us know.

## 2014-01-16 NOTE — Patient Instructions (Addendum)
Check formulary status of Pradaxa Xarelto Eliqis  Your physician has recommended you make the following change in your medication:  START FLECAINIDE 100 MG TWICE A DAY 12 HOURS APART DECREASE METOPROLOL TO 50 MG TWICE DAILY 12 HOURS APART Your physician recommends that you schedule a follow-up appointment in: 1 MONTH WITH EKG TO DISCUSS CARDIOVERSION

## 2014-01-17 LAB — LIPID PANEL
Cholesterol: 163 mg/dL (ref 0–200)
HDL: 34 mg/dL — ABNORMAL LOW (ref >39)
LDL Cholesterol: 98 mg/dL (ref 0–99)
Total CHOL/HDL Ratio: 4.8 Ratio
Triglycerides: 153 mg/dL — ABNORMAL HIGH (ref <150)
VLDL: 31 mg/dL (ref 0–40)

## 2014-01-17 LAB — VITAMIN D 25 HYDROXY (VIT D DEFICIENCY, FRACTURES): Vit D, 25-Hydroxy: 18 ng/mL — ABNORMAL LOW (ref 30–89)

## 2014-01-17 LAB — COMPLETE METABOLIC PANEL WITH GFR
ALK PHOS: 60 U/L (ref 39–117)
ALT: 28 U/L (ref 0–35)
AST: 16 U/L (ref 0–37)
Albumin: 3.8 g/dL (ref 3.5–5.2)
BUN: 13 mg/dL (ref 6–23)
CO2: 21 meq/L (ref 19–32)
CREATININE: 0.62 mg/dL (ref 0.50–1.10)
Calcium: 8.8 mg/dL (ref 8.4–10.5)
Chloride: 105 mEq/L (ref 96–112)
GFR, Est Non African American: >89 mL/min
Glucose, Bld: 163 mg/dL — ABNORMAL HIGH (ref 70–99)
Potassium: 4.4 mEq/L (ref 3.5–5.3)
Sodium: 134 mEq/L — ABNORMAL LOW (ref 135–145)
Total Bilirubin: 0.5 mg/dL (ref 0.2–1.2)
Total Protein: 6.9 g/dL (ref 6.0–8.3)

## 2014-01-18 ENCOUNTER — Telehealth: Payer: Self-pay

## 2014-01-18 ENCOUNTER — Telehealth: Payer: Self-pay | Admitting: Cardiovascular Disease

## 2014-01-18 MED ORDER — VITAMIN D (ERGOCALCIFEROL) 1.25 MG (50000 UNIT) PO CAPS: 50000.0000 [IU] | ORAL_CAPSULE | ORAL | Status: DC

## 2014-01-18 NOTE — Telephone Encounter (Signed)
Patient returned phone call Prescription sent to [facility] on [address]

## 2014-01-18 NOTE — Telephone Encounter (Signed)
Spoke with patient who states she cannot afford Flecainide.  Patient states she has not checked into the costs of the NOACs; patient states she will remain on Coumadin for the time being until the cost of the Flecainide is worked out.  I advised patient that I will send message to Dr. Elease Hashimoto, however he is out of the office until next week.  Patient denies symptoms - states she is taking Metoprolol as directed and does not currently have any complaints.  I advised her to call the office with concerns and that Dr. Elease Hashimoto will address her concerns about the cost of the Flecainide next week.  Patient verbalized understanding and agreement.

## 2014-01-18 NOTE — Telephone Encounter (Signed)
Patient not available Left message on voice mail to return our call 

## 2014-01-18 NOTE — Telephone Encounter (Signed)
Message copied by Lestine Mount on Wed Jan 18, 2014 12:00 PM ------      Message from: Doris Cheadle      Created: Wed Jan 18, 2014 10:11 AM       Blood work reviewed, noticed low vitamin D, call patient advise to start ergocalciferol 50,000 units once a week for the duration of  12 weeks.       ------

## 2014-01-18 NOTE — Telephone Encounter (Signed)
New message     Pt can't afford flecainide---is there anything else she can get cheaper?

## 2014-01-20 ENCOUNTER — Ambulatory Visit: Payer: BC Managed Care – PPO | Attending: Internal Medicine | Admitting: Internal Medicine

## 2014-01-20 ENCOUNTER — Encounter: Payer: Self-pay | Admitting: Internal Medicine

## 2014-01-20 ENCOUNTER — Telehealth: Payer: Self-pay | Admitting: *Deleted

## 2014-01-20 VITALS — BP 132/82 | HR 92 | Temp 98.9°F | Resp 17

## 2014-01-20 DIAGNOSIS — E119 Type 2 diabetes mellitus without complications: Secondary | ICD-10-CM

## 2014-01-20 DIAGNOSIS — I10 Essential (primary) hypertension: Secondary | ICD-10-CM

## 2014-01-20 DIAGNOSIS — E669 Obesity, unspecified: Secondary | ICD-10-CM

## 2014-01-20 DIAGNOSIS — E559 Vitamin D deficiency, unspecified: Secondary | ICD-10-CM | POA: Insufficient documentation

## 2014-01-20 DIAGNOSIS — I4891 Unspecified atrial fibrillation: Secondary | ICD-10-CM

## 2014-01-20 MED ORDER — METFORMIN HCL 500 MG PO TABS: 1000.0000 mg | ORAL_TABLET | Freq: Two times a day (BID) | ORAL | Status: DC

## 2014-01-20 NOTE — Progress Notes (Signed)
MRN: [id number] Name: Kelly Lang  Sex: female Age: 53 y.o. DOB: [birth year]  Allergies: Codeine  Chief Complaint  Patient presents with  . Follow-up    HPI: Patient is 53 y.o. female who history of hypertension and A. fib on Coumadin following up with the cardiologist recently had been prescribed her flecainide medication  which patient is going to start soon, has history of diabetes, comes today for followup, recently had a blood work done which was reviewed with the patient noticed vitamin D deficiency, she has already been started on supplement, patient denies any hypoglycemic symptoms her fasting sugar is usually more than 1 50 mg/dL currently she is taking metformin 500 mg twice a day, recent hemoglobin A1c 6.9% improved from before. Patient denies any chest pain shortness of breath any orthopnea or PND.  Past Medical History  Diagnosis Date  . Diabetes mellitus     a. hg A1c 10, newly diagnosed (10/26/13)  . Obesity   . H/O: hypothyroidism     a. thyroid biopsy 1996 with synthroid. Stopped taking rx and was loss to follow up b. normal thyroid function 10/20/13  . Arthritis   . Atrial fibrillation with RVR     a diagnosed 10/25/2013  . Tachycardia induced cardiomyopathy     a. 2/2 Afib with RVR for unknown duration (EF: 40-45%)  . Dyslipidemia   . Hx of cardiovascular stress test     ETT/Lexiscan Myoview (12/2013):  No ischemia; not gated; low risk.    Past Surgical History  Procedure Laterality Date  . Biopsy thyroid      1996  . Tonsillectomy  1971  . Eye muscle surgery Right ~ 1966  . Cardioversion N/A 12/07/2013    Procedure: CARDIOVERSION;  Surgeon: Everette RankJay Varanasi, MD;  [facility];  Service: Cardiovascular;  Laterality: N/A;      Medication List       This list is accurate as of: 01/20/14  2:19 PM.  Always use your most recent med list.               acetaminophen 500 MG tablet  Commonly known as:  TYLENOL  Take 500 mg by mouth every 6  (six) hours as needed.     flecainide 100 MG tablet  Commonly known as:  TAMBOCOR  Take 1 tablet (100 mg total) by mouth 2 (two) times daily.     lisinopril 2.5 MG tablet  Commonly known as:  PRINIVIL,ZESTRIL  Take 1 tablet (2.5 mg total) by mouth daily.     metFORMIN 500 MG tablet  Commonly known as:  GLUCOPHAGE  Take 2 tablets (1,000 mg total) by mouth 2 (two) times daily with a meal.     metoprolol 50 MG tablet  Commonly known as:  LOPRESSOR  Take 1 tablet (50 mg total) by mouth 2 (two) times daily.     Vitamin D (Ergocalciferol) 50000 UNITS Caps capsule  Commonly known as:  DRISDOL  Take 1 capsule (50,000 Units total) by mouth every 7 (seven) days.     warfarin 5 MG tablet  Commonly known as:  COUMADIN  Take 5-7.5 mg by mouth daily. 5mg  (1 tablet) on Monday, Wednesday, Friday and Saturday; 7.5 mg (1.5 tablets) on Tuesday, Thursday, and Sunday        Meds ordered this encounter  Medications  . metFORMIN (GLUCOPHAGE) 500 MG tablet    Sig: Take 2 tablets (1,000 mg total) by mouth 2 (two) times daily with a meal.  Dispense:  120 tablet    Refill:  3    Order Specific Question:  Supervising Provider    Answer:  Kristeen Miss J [[id number]]     There is no immunization history on file for this patient.  History reviewed. No pertinent family history.  History  Substance Use Topics  . Smoking status: Former Smoker -- 1.00 packs/day for 22 years    Types: Cigarettes    Quit date: 12/01/2010  . Smokeless tobacco: Never Used  . Alcohol Use: Yes     Comment: 10/25/2013 "glass of wine maybe q other month"    Review of Systems   As noted in HPI  Filed Vitals:   01/20/14 1401  BP: 132/82  Pulse: 92  Temp: 98.9 F (37.2 C)  Resp: 17    Physical Exam  Physical Exam  Constitutional: No distress.  Eyes: EOM are normal. Pupils are equal, round, and reactive to light.  Cardiovascular: Normal rate.   Irregularly irregular  Pulmonary/Chest: No respiratory  distress. She has no wheezes. She has no rales.  Musculoskeletal: She exhibits no edema.    CBC    Component Value Date/Time   WBC 7.7 11/29/2013 0952   RBC 4.51 11/29/2013 0952   HGB 14.3 11/29/2013 0952   HCT 42.3 11/29/2013 0952   PLT 234.0 11/29/2013 0952   MCV 93.7 11/29/2013 0952   LYMPHSABS 1.5 11/29/2013 0952   MONOABS 0.6 11/29/2013 0952   EOSABS 0.2 11/29/2013 0952   BASOSABS 0.0 11/29/2013 0952    CMP     Component Value Date/Time   NA 134* 01/16/2014 0920   K 4.4 01/16/2014 0920   CL 105 01/16/2014 0920   CO2 21 01/16/2014 0920   GLUCOSE 163* 01/16/2014 0920   BUN 13 01/16/2014 0920   CREATININE 0.62 01/16/2014 0920   CREATININE 0.7 12/14/2013 1641   CALCIUM 8.8 01/16/2014 0920   PROT 6.9 01/16/2014 0920   ALBUMIN 3.8 01/16/2014 0920   AST 16 01/16/2014 0920   ALT 28 01/16/2014 0920   ALKPHOS 60 01/16/2014 0920   BILITOT 0.5 01/16/2014 0920   GFRNONAA >90 10/26/2013 0448   GFRAA >90 10/26/2013 0448    Lab Results  Component Value Date/Time   CHOL 163 01/16/2014  9:20 AM    No components found with this basename: hga1c    Lab Results  Component Value Date/Time   AST 16 01/16/2014  9:20 AM    Assessment and Plan  Diabetes mellitus - Plan: Recent A1c is 6.9%, I have increased the dose of metformin to  1 g twice a day metFORMIN (GLUCOPHAGE) 500 MG tablet,   Obesity Diet and exercise  Unspecified vitamin D deficiency Started on vitamin D supplement her  Atrial fibrillation  On Coumadin and flecainide/metoprolol following up with her cardiologist. Rate controlled.  Essential hypertension, benign Controlled continue with current medications   Return in about 3 months (around 04/19/2014) for diabetes.  Doris Cheadle, MD

## 2014-01-20 NOTE — Progress Notes (Signed)
Patient here for follow up on her DM Concerned because she can not get her blood sugars under 150 Blood sugar this am was 158

## 2014-01-20 NOTE — Patient Instructions (Signed)

## 2014-01-20 NOTE — Telephone Encounter (Signed)
Patient stated that she was able to get her flecanide at an affordable cost with the coupon and they changed it to a month supply. She just wanted to make [place] aware.

## 2014-01-24 ENCOUNTER — Telehealth: Payer: Self-pay | Admitting: *Deleted

## 2014-01-24 NOTE — Telephone Encounter (Addendum)
Paper work to be sent to her for a medication program per Selena Batten Rn/ refills.

## 2014-01-24 NOTE — Telephone Encounter (Signed)
Patient assistance form mailed to patient for her [facility]

## 2014-01-30 ENCOUNTER — Ambulatory Visit (INDEPENDENT_AMBULATORY_CARE_PROVIDER_SITE_OTHER): Payer: BC Managed Care – PPO

## 2014-01-30 DIAGNOSIS — Z5181 Encounter for therapeutic drug level monitoring: Secondary | ICD-10-CM

## 2014-01-30 DIAGNOSIS — Z7901 Long term (current) use of anticoagulants: Secondary | ICD-10-CM

## 2014-01-30 DIAGNOSIS — I4891 Unspecified atrial fibrillation: Secondary | ICD-10-CM

## 2014-01-30 LAB — POCT INR: INR: 2.1

## 2014-02-16 ENCOUNTER — Encounter: Payer: Self-pay | Admitting: Cardiovascular Disease

## 2014-02-16 ENCOUNTER — Ambulatory Visit (INDEPENDENT_AMBULATORY_CARE_PROVIDER_SITE_OTHER): Payer: BC Managed Care – PPO | Admitting: *Deleted

## 2014-02-16 ENCOUNTER — Ambulatory Visit (INDEPENDENT_AMBULATORY_CARE_PROVIDER_SITE_OTHER): Payer: BC Managed Care – PPO | Admitting: Cardiovascular Disease

## 2014-02-16 VITALS — BP 112/90 | HR 60 | Ht 65.0 in | Wt 232.8 lb

## 2014-02-16 DIAGNOSIS — I4891 Unspecified atrial fibrillation: Secondary | ICD-10-CM

## 2014-02-16 DIAGNOSIS — I10 Essential (primary) hypertension: Secondary | ICD-10-CM

## 2014-02-16 DIAGNOSIS — Z5181 Encounter for therapeutic drug level monitoring: Secondary | ICD-10-CM

## 2014-02-16 DIAGNOSIS — Z7901 Long term (current) use of anticoagulants: Secondary | ICD-10-CM

## 2014-02-16 DIAGNOSIS — Z79899 Other long term (current) drug therapy: Secondary | ICD-10-CM

## 2014-02-16 LAB — POCT INR: INR: 2.4

## 2014-02-16 NOTE — Assessment & Plan Note (Signed)
Kelly Lang  is doing well.  She converted to normal sinus rhythm. She noticed that she felt quite a bit better about 2 weeks ago.  We will continue with her same medications. We will bring her back for a symptom limited treadmill test. She is to continue the metoprolol and flecainide.  I'll see her again in 6 months for followup visit.

## 2014-02-16 NOTE — Progress Notes (Signed)
[address] [place], [place]  [address] Phone: [phone] Fax:  [phone]  Date:  02/16/2014   ID:  Kelly Lang, DOB 08/07/1961, MRN [id number]  PCP:  Doris CheadleADVANI, DEEPAK, MD  Cardiologist:  Dr. Delane GingerPhil Nahser    Problem List: 1. Atrial fibrillation 2. Hypothyroidism 3. Type 2 diabetes mellitus  History of Present Illness: Kelly Lang is a 53 y.o. female with a hx of T2DM, HL, thyroid disease and atrial fibrillation.  She had been on Synthroid for some time after a thyroid bx in 1996.  She was admitted 10/2013 with AFib with RVR.  Echo (10/26/13):  EF 40-45%, normal wall motion, mild LAE.  CHADS2-VASc= 3 (LV dysfunction, DM, female).  She was placed on coumadin.  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  She was last seen 11/29/2013.  She was set up for DCCV after 3-4 weeks of appropriate anticoagulation.  DCCV was performed 12/07/2013 but was unsuccessful.  She returns for follow up.  She is stable. She is frustrated with her atrial fibrillation. She does get tired more easily. She denies chest pain. She denies significant dyspnea. She is NYHA class II. She denies syncope. She denies orthopnea, PND, edema, palpitations.  Feb. 16, 2015:  She had a cardioversion twice.  Converted back to Afib both times. She has had a stress myoview which was negative for ischemia.    we will start flecainide today.  February 15, 2014:  Kelly Lang returns for further evaluation of her atrial fib.  She has been on Flecainide 100 BID for a month.  She started feeling better about 2 weeks ago.  She has converted to NSR.   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      Recent Labs: 10/25/2013: TSH 1.020  11/29/2013: Hemoglobin 14.3  01/16/2014: ALT 28; Creatinine 0.62; HDL Cholesterol 34*; LDL (calc) 98; Potassium 4.4  02/16/2014: POC INR 2.4   Wt Readings from Last 3 Encounters:  02/16/14 232 lb 12.8 oz (105.597 kg)   01/16/14 231 lb (104.781 kg)  12/28/13 234 lb (106.142 kg)     Past Medical History  Diagnosis Date  . Diabetes mellitus     a. hg A1c 10, newly diagnosed (10/26/13)  . Obesity   . H/O: hypothyroidism     a. thyroid biopsy 1996 with synthroid. Stopped taking rx and was loss to follow up b. normal thyroid function 10/20/13  . Arthritis   . Atrial fibrillation with RVR     a diagnosed 10/25/2013  . Tachycardia induced cardiomyopathy     a. 2/2 Afib with RVR for unknown duration (EF: 40-45%)  . Dyslipidemia   . Hx of cardiovascular stress test     ETT/Lexiscan Myoview (12/2013):  No ischemia; not gated; low risk.    Current Outpatient Prescriptions  Medication Sig Dispense Refill  . acetaminophen (TYLENOL) 500 MG tablet Take 500 mg by mouth every 6 (six) hours as needed.      . flecainide (TAMBOCOR) 100 MG tablet Take 1 tablet (100 mg total) by mouth 2 (two) times daily.  180 tablet  3  . lisinopril (PRINIVIL,ZESTRIL) 2.5 MG tablet Take 1 tablet (2.5 mg total) by mouth daily.  30 tablet  6  . metFORMIN (GLUCOPHAGE) 500 MG tablet Take 2 tablets (1,000 mg total) by mouth 2 (two) times daily with a meal.  120 tablet  3  . metoprolol (LOPRESSOR) 50 MG tablet Take 1  tablet (50 mg total) by mouth 2 (two) times daily.  90 tablet  11  . Vitamin D, Ergocalciferol, (DRISDOL) 50000 UNITS CAPS capsule Take 1 capsule (50,000 Units total) by mouth every 7 (seven) days.  12 capsule  0  . warfarin (COUMADIN) 5 MG tablet Take 5-7.5 mg by mouth daily. 5mg  (1 tablet) on Monday, Wednesday, Friday and Saturday; 7.5 mg (1.5 tablets) on Tuesday, Thursday, and Sunday       No current facility-administered medications for this visit.    Allergies:   Codeine   Social History:  The patient  reports that she quit smoking about 3 years ago. Her smoking use included Cigarettes. She has a 22 pack-year smoking history. She has never used smokeless tobacco. She reports that she drinks alcohol. She reports that she  does not use illicit drugs.   Family History:  The patient's family history is not on file.   ROS:  Please see the history of present illness.   She denies any bleeding problems.   All other systems reviewed and negative.   PHYSICAL EXAM: VS:  BP 112/90  Pulse 60  Ht 5\' 5"  (1.651 m)  Wt 232 lb 12.8 oz (105.597 kg)  BMI 38.74 kg/m2 Well nourished, well developed, in no acute distress HEENT: normal Neck: no JVD Cardiac:  normal S1, S2; irregularly irregular rhythm; no murmur Lungs:  clear to auscultation bilaterally, no wheezing, rhonchi or rales Abd: soft, nontender, no hepatomegaly Ext: no edema Skin: warm and dry Neuro:  CNs 2-12 intact, no focal abnormalities noted  EKG:   Mar. 19, 2015.   NSR at 60 ,  QRS duration 114.    ASSESSMENT AND PLAN:

## 2014-02-16 NOTE — Patient Instructions (Signed)
Your physician has requested that you have an exercise tolerance test. . Please also follow instruction sheet, as given.  Your physician wants you to follow-up in: 6 months  You will receive a reminder letter in the mail two months in advance. If you don't receive a letter, please call our office to schedule the follow-up appointment.

## 2014-02-24 ENCOUNTER — Telehealth (HOSPITAL_COMMUNITY): Payer: Self-pay

## 2014-03-01 ENCOUNTER — Encounter (HOSPITAL_COMMUNITY): Payer: BC Managed Care – PPO

## 2014-03-08 ENCOUNTER — Ambulatory Visit (HOSPITAL_COMMUNITY)
Admission: RE | Admit: 2014-03-08 | Discharge: 2014-03-08 | Disposition: A | Discharge status: Home / Self Care | Payer: BC Managed Care – PPO | Source: Ambulatory Visit | Attending: Internal Medicine | Admitting: Internal Medicine

## 2014-03-08 DIAGNOSIS — I4891 Unspecified atrial fibrillation: Secondary | ICD-10-CM | POA: Insufficient documentation

## 2014-03-08 DIAGNOSIS — Z79899 Other long term (current) drug therapy: Secondary | ICD-10-CM

## 2014-03-16 ENCOUNTER — Ambulatory Visit (INDEPENDENT_AMBULATORY_CARE_PROVIDER_SITE_OTHER): Payer: BC Managed Care – PPO | Admitting: *Deleted

## 2014-03-16 DIAGNOSIS — Z5181 Encounter for therapeutic drug level monitoring: Secondary | ICD-10-CM

## 2014-03-16 DIAGNOSIS — Z7901 Long term (current) use of anticoagulants: Secondary | ICD-10-CM

## 2014-03-16 DIAGNOSIS — I4891 Unspecified atrial fibrillation: Secondary | ICD-10-CM

## 2014-03-16 LAB — POCT INR: INR: 2.8

## 2014-03-27 ENCOUNTER — Other Ambulatory Visit (HOSPITAL_COMMUNITY): Payer: Self-pay | Admitting: Physician Assistant

## 2014-04-03 ENCOUNTER — Ambulatory Visit: Payer: BC Managed Care – PPO | Attending: Internal Medicine | Admitting: Internal Medicine

## 2014-04-03 ENCOUNTER — Ambulatory Visit (HOSPITAL_COMMUNITY)
Admission: RE | Admit: 2014-04-03 | Discharge: 2014-04-03 | Disposition: A | Discharge status: Home / Self Care | Payer: BC Managed Care – PPO | Source: Ambulatory Visit | Attending: Internal Medicine | Admitting: Internal Medicine

## 2014-04-03 ENCOUNTER — Encounter: Payer: Self-pay | Admitting: Internal Medicine

## 2014-04-03 VITALS — BP 144/67 | HR 58 | Temp 98.3°F | Resp 16 | Ht 64.5 in | Wt 234.0 lb

## 2014-04-03 DIAGNOSIS — M545 Low back pain, unspecified: Secondary | ICD-10-CM | POA: Insufficient documentation

## 2014-04-03 DIAGNOSIS — E119 Type 2 diabetes mellitus without complications: Secondary | ICD-10-CM | POA: Insufficient documentation

## 2014-04-03 DIAGNOSIS — I4891 Unspecified atrial fibrillation: Secondary | ICD-10-CM | POA: Insufficient documentation

## 2014-04-03 DIAGNOSIS — W19XXXA Unspecified fall, initial encounter: Secondary | ICD-10-CM | POA: Insufficient documentation

## 2014-04-03 DIAGNOSIS — E559 Vitamin D deficiency, unspecified: Secondary | ICD-10-CM | POA: Insufficient documentation

## 2014-04-03 LAB — POCT GLYCOSYLATED HEMOGLOBIN (HGB A1C): Hemoglobin A1C: 6.9

## 2014-04-03 LAB — GLUCOSE, POCT (MANUAL RESULT ENTRY): POC Glucose: 161 mg/dl — AB (ref 70–99)

## 2014-04-03 NOTE — Progress Notes (Signed)
Pt is here following up on her diabetes and arthritis. Pt fell on ice back in this winter and she now has occasional back pain.

## 2014-04-03 NOTE — Progress Notes (Signed)
Patient ID: Kelly Lang, female   DOB: 1961/03/30, 53 y.o.   MRN: [id number]   Kelly Lang, is a 53 y.o. female  MVH:[id number]  [id number]  DOB - 1961/03/30  Chief Complaint  Patient presents with  . Follow-up        Subjective:   Kelly Dewancy Burkemper is a 53 y.o. female here today for a follow up visit. Patient has history of T2DM, hyperlipidemia, thyroid disease and atrial fibrillation. She had been on Synthroid for some time after a thyroid biopsy in 1996. She was admitted 10/2013 with AFib with RVR. Echo (10/26/13): EF 40-45%, normal wall motion, mild LAE. CHADS2-VASc= 3 (LV dysfunction, DM, female). She was placed on coumadin. 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 Pt is here following up on her diabetes and arthritis. She fell on ice back in the winter and she now has occasional back pain. Her blood pressure is controlled as well as her blood sugar Patient has No headache, No chest pain, No abdominal pain - No Nausea, No new weakness tingling or numbness, No Cough - SOB.  Problem  Diabetes  Low Back Pain    ALLERGIES: Allergies  Allergen Reactions  . Codeine Itching and Rash    PAST MEDICAL HISTORY: Past Medical History  Diagnosis Date  . Diabetes mellitus     a. hg A1c 10, newly diagnosed (10/26/13)  . Obesity   . H/O: hypothyroidism     a. thyroid biopsy 1996 with synthroid. Stopped taking rx and was loss to follow up b. normal thyroid function 10/20/13  . Arthritis   . Atrial fibrillation with RVR     a diagnosed 10/25/2013  . Tachycardia induced cardiomyopathy     a. 2/2 Afib with RVR for unknown duration (EF: 40-45%)  . Dyslipidemia   . Hx of cardiovascular stress test     ETT/Lexiscan Myoview (12/2013):  No ischemia; not gated; low risk.    MEDICATIONS AT HOME: Prior to Admission medications   Medication Sig Start Date End Date Taking? Authorizing Provider  acetaminophen (TYLENOL) 500 MG tablet Take 500 mg by mouth every 6  (six) hours as needed.    Historical Provider, MD  flecainide (TAMBOCOR) 100 MG tablet Take 1 tablet (100 mg total) by mouth 2 (two) times daily. 01/16/14   Vesta MixerPhilip J Nahser, MD  lisinopril (PRINIVIL,ZESTRIL) 2.5 MG tablet Take 1 tablet (2.5 mg total) by mouth daily. 11/29/13   Beatrice LecherScott T Weaver, PA-C  metFORMIN (GLUCOPHAGE) 500 MG tablet Take 2 tablets (1,000 mg total) by mouth 2 (two) times daily with a meal. 01/20/14   Doris Cheadleeepak Advani, MD  metoprolol (LOPRESSOR) 50 MG tablet Take 1 tablet (50 mg total) by mouth 2 (two) times daily. 01/16/14   Vesta MixerPhilip J Nahser, MD  Vitamin D, Ergocalciferol, (DRISDOL) 50000 UNITS CAPS capsule Take 1 capsule (50,000 Units total) by mouth every 7 (seven) days. 01/18/14   Doris Cheadleeepak Advani, MD  warfarin (COUMADIN) 5 MG tablet Take as directed by anticoagulation clinic 03/27/14   Vesta MixerPhilip J Nahser, MD     Objective:   Filed Vitals:   04/03/14 1026  BP: 144/67  Pulse: 58  Temp: 98.3 F (36.8 C)  TempSrc: Oral  Resp: 16  Height: 5' 4.5" (1.638 m)  Weight: 234 lb (106.142 kg)  SpO2: 98%    Exam General appearance : Awake, alert, not in any distress. Speech Clear. Not toxic looking HEENT: Atraumatic and Normocephalic, pupils equally reactive to light and accomodation Neck: supple, no JVD. No  cervical lymphadenopathy.  Chest:Good air entry bilaterally, no added sounds  CVS: S1 S2 regular, no murmurs.  Abdomen: Bowel sounds present, Non tender and not distended with no gaurding, rigidity or rebound. Extremities: B/L Lower Ext shows no edema, both legs are warm to touch Neurology: Awake alert, and oriented X 3, CN II-XII intact, Non focal Skin:No Rash Wounds:N/A  Data Review Lab Results  Component Value Date   HGBA1C 6.9 04/03/2014   HGBA1C 6.9 01/16/2014   HGBA1C 10.0* 10/25/2013     Assessment & Plan   1. Diabetes  - Glucose (CBG) - HgB A1c today is 6.9% the same as it was in February Continue current regimen of metformin 500 mg tablet by mouth twice a  day Diabetic diet  2. Unspecified vitamin D deficiency Continue ergocalciferol by mouth  3. Atrial fibrillation Continue flecainide and metoprolol Continue anticoagulation  4. Low back pain due to fall  - DG Lumbar Spine Complete; Future  Patient was extensively counseled on nutrition and exercise  Return in about 3 months (around 07/04/2014), or if symptoms worsen or fail to improve, for Hemoglobin A1C and Follow up, DM, Pap Smear.  The patient was given clear instructions to go to ER or return to medical center if symptoms don't improve, worsen or new problems develop. The patient verbalized understanding. The patient was told to call to get lab results if they haven't heard anything in the next week.   This note has been created with Education officer, environmental. Any transcriptional errors are unintentional.    Jeanann Lewandowsky, MD, MHA, FACP, [facility] and [facility] [place], [place] [phone]   04/03/2014, 11:23 AM

## 2014-04-06 ENCOUNTER — Telehealth: Payer: Self-pay | Admitting: Emergency Medicine

## 2014-04-06 NOTE — Telephone Encounter (Signed)
Message copied by Darlis Loan on Thu Apr 06, 2014 12:32 PM ------      Message from: Jeanann Lewandowsky E      Created: Wed Apr 05, 2014  3:16 PM       Please inform patient that her lumbar spine x-ray shows mild osteoarthritis with no bony abnormalities. We recommend regular physical exercise and continue her pain medication ------

## 2014-04-06 NOTE — Telephone Encounter (Signed)
Pt given lab results with instructions to adhere with physical exercise and pain medications

## 2014-04-27 ENCOUNTER — Ambulatory Visit (INDEPENDENT_AMBULATORY_CARE_PROVIDER_SITE_OTHER): Payer: BC Managed Care – PPO | Admitting: Pharmacist Clinician (PhC)/ Clinical Pharmacy Specialist

## 2014-04-27 DIAGNOSIS — I4891 Unspecified atrial fibrillation: Secondary | ICD-10-CM

## 2014-04-27 DIAGNOSIS — Z7901 Long term (current) use of anticoagulants: Secondary | ICD-10-CM

## 2014-04-27 DIAGNOSIS — Z5181 Encounter for therapeutic drug level monitoring: Secondary | ICD-10-CM

## 2014-04-27 LAB — POCT INR: INR: 2

## 2014-05-31 ENCOUNTER — Telehealth: Payer: Self-pay | Admitting: *Deleted

## 2014-05-31 NOTE — Telephone Encounter (Signed)
Pharmacy calling for verbal order testing strips (Bayer Contour strips). Verbal order given for strips for patient to test three times a day.

## 2014-06-01 NOTE — Telephone Encounter (Signed)
Encounter complete. 

## 2014-06-05 ENCOUNTER — Ambulatory Visit (INDEPENDENT_AMBULATORY_CARE_PROVIDER_SITE_OTHER): Payer: BC Managed Care – PPO | Admitting: *Deleted

## 2014-06-05 DIAGNOSIS — I4891 Unspecified atrial fibrillation: Secondary | ICD-10-CM

## 2014-06-05 DIAGNOSIS — Z7901 Long term (current) use of anticoagulants: Secondary | ICD-10-CM

## 2014-06-05 DIAGNOSIS — Z5181 Encounter for therapeutic drug level monitoring: Secondary | ICD-10-CM

## 2014-06-05 LAB — POCT INR: INR: 1.9

## 2014-06-19 ENCOUNTER — Other Ambulatory Visit: Payer: Self-pay | Admitting: *Deleted

## 2014-06-19 ENCOUNTER — Encounter: Payer: Self-pay | Admitting: Internal Medicine

## 2014-06-19 DIAGNOSIS — E119 Type 2 diabetes mellitus without complications: Secondary | ICD-10-CM

## 2014-06-19 MED ORDER — METFORMIN HCL 500 MG PO TABS: 1000.0000 mg | ORAL_TABLET | Freq: Two times a day (BID) | ORAL | Status: DC

## 2014-06-19 NOTE — Telephone Encounter (Signed)
error 

## 2014-06-27 ENCOUNTER — Other Ambulatory Visit: Payer: Self-pay | Admitting: Physician Assistant

## 2014-07-03 ENCOUNTER — Ambulatory Visit (INDEPENDENT_AMBULATORY_CARE_PROVIDER_SITE_OTHER): Payer: BC Managed Care – PPO

## 2014-07-03 ENCOUNTER — Ambulatory Visit: Payer: BC Managed Care – PPO | Attending: Internal Medicine | Admitting: Internal Medicine

## 2014-07-03 ENCOUNTER — Telehealth: Payer: Self-pay | Admitting: Cardiovascular Disease

## 2014-07-03 ENCOUNTER — Encounter: Payer: Self-pay | Admitting: Internal Medicine

## 2014-07-03 VITALS — BP 118/66 | HR 85 | Temp 97.7°F | Resp 18 | Ht 65.5 in | Wt 241.6 lb

## 2014-07-03 DIAGNOSIS — M129 Arthropathy, unspecified: Secondary | ICD-10-CM | POA: Insufficient documentation

## 2014-07-03 DIAGNOSIS — E669 Obesity, unspecified: Secondary | ICD-10-CM | POA: Insufficient documentation

## 2014-07-03 DIAGNOSIS — I4891 Unspecified atrial fibrillation: Secondary | ICD-10-CM

## 2014-07-03 DIAGNOSIS — Z7901 Long term (current) use of anticoagulants: Secondary | ICD-10-CM | POA: Insufficient documentation

## 2014-07-03 DIAGNOSIS — E785 Hyperlipidemia, unspecified: Secondary | ICD-10-CM | POA: Insufficient documentation

## 2014-07-03 DIAGNOSIS — E118 Type 2 diabetes mellitus with unspecified complications: Secondary | ICD-10-CM | POA: Insufficient documentation

## 2014-07-03 DIAGNOSIS — Z885 Allergy status to narcotic agent status: Secondary | ICD-10-CM | POA: Insufficient documentation

## 2014-07-03 DIAGNOSIS — Z87891 Personal history of nicotine dependence: Secondary | ICD-10-CM | POA: Insufficient documentation

## 2014-07-03 DIAGNOSIS — Z5181 Encounter for therapeutic drug level monitoring: Secondary | ICD-10-CM

## 2014-07-03 DIAGNOSIS — E138 Other specified diabetes mellitus with unspecified complications: Secondary | ICD-10-CM

## 2014-07-03 LAB — GLUCOSE, POCT (MANUAL RESULT ENTRY): POC Glucose: 177 mg/dl — AB (ref 70–99)

## 2014-07-03 LAB — POCT GLYCOSYLATED HEMOGLOBIN (HGB A1C): Hemoglobin A1C: 7.4

## 2014-07-03 LAB — POCT INR: INR: 2.3

## 2014-07-03 MED ORDER — GLIPIZIDE 5 MG PO TABS: 5.0000 mg | ORAL_TABLET | Freq: Every day | ORAL | Status: DC

## 2014-07-03 NOTE — Patient Instructions (Signed)
DASH Eating Plan °DASH stands for "Dietary Approaches to Stop Hypertension." The DASH eating plan is a healthy eating plan that has been shown to reduce high blood pressure (hypertension). Additional health benefits may include reducing the risk of type 2 diabetes mellitus, heart disease, and stroke. The DASH eating plan may also help with weight loss. °WHAT DO I NEED TO KNOW ABOUT THE DASH EATING PLAN? °For the DASH eating plan, you will follow these general guidelines: °· Choose foods with a percent daily value for sodium of less than 5% (as listed on the food label). °· Use salt-free seasonings or herbs instead of table salt or sea salt. °· Check with your health care provider or pharmacist before using salt substitutes. °· Eat lower-sodium products, often labeled as "lower sodium" or "no salt added." °· Eat fresh foods. °· Eat more vegetables, fruits, and low-fat dairy products. °· Choose whole grains. Look for the word "whole" as the first word in the ingredient list. °· Choose fish and skinless chicken or turkey more often than red meat. Limit fish, poultry, and meat to 6 oz (170 g) each day. °· Limit sweets, desserts, sugars, and sugary drinks. °· Choose heart-healthy fats. °· Limit cheese to 1 oz (28 g) per day. °· Eat more home-cooked food and less restaurant, buffet, and fast food. °· Limit fried foods. °· Cook foods using methods other than frying. °· Limit canned vegetables. If you do use them, rinse them well to decrease the sodium. °· When eating at a restaurant, ask that your food be prepared with less salt, or no salt if possible. °WHAT FOODS CAN I EAT? °Seek help from a dietitian for individual calorie needs. °Grains °Whole grain or whole wheat bread. Brown rice. Whole grain or whole wheat pasta. Quinoa, bulgur, and whole grain cereals. Low-sodium cereals. Corn or whole wheat flour tortillas. Whole grain cornbread. Whole grain crackers. Low-sodium crackers. °Vegetables °Fresh or frozen vegetables  (raw, steamed, roasted, or grilled). Low-sodium or reduced-sodium tomato and vegetable juices. Low-sodium or reduced-sodium tomato sauce and paste. Low-sodium or reduced-sodium canned vegetables.  °Fruits °All fresh, canned (in natural juice), or frozen fruits. °Meat and Other Protein Products °Ground beef (85% or leaner), grass-fed beef, or beef trimmed of fat. Skinless chicken or turkey. Ground chicken or turkey. Pork trimmed of fat. All fish and seafood. Eggs. Dried beans, peas, or lentils. Unsalted nuts and seeds. Unsalted canned beans. °Dairy °Low-fat dairy products, such as skim or 1% milk, 2% or reduced-fat cheeses, low-fat ricotta or cottage cheese, or plain low-fat yogurt. Low-sodium or reduced-sodium cheeses. °Fats and Oils °Tub margarines without trans fats. Light or reduced-fat mayonnaise and salad dressings (reduced sodium). Avocado. Safflower, olive, or canola oils. Natural peanut or almond butter. °Other °Unsalted popcorn and pretzels. °The items listed above may not be a complete list of recommended foods or beverages. Contact your dietitian for more options. °WHAT FOODS ARE NOT RECOMMENDED? °Grains °White bread. White pasta. White rice. Refined cornbread. Bagels and croissants. Crackers that contain trans fat. °Vegetables °Creamed or fried vegetables. Vegetables in a cheese sauce. Regular canned vegetables. Regular canned tomato sauce and paste. Regular tomato and vegetable juices. °Fruits °Dried fruits. Canned fruit in light or heavy syrup. Fruit juice. °Meat and Other Protein Products °Fatty cuts of meat. Ribs, chicken wings, bacon, sausage, bologna, salami, chitterlings, fatback, hot dogs, bratwurst, and packaged luncheon meats. Salted nuts and seeds. Canned beans with salt. °Dairy °Whole or 2% milk, cream, half-and-half, and cream cheese. Whole-fat or sweetened yogurt. Full-fat   cheeses or blue cheese. Nondairy creamers and whipped toppings. Processed cheese, cheese spreads, or cheese  curds. °Condiments °Onion and garlic salt, seasoned salt, table salt, and sea salt. Canned and packaged gravies. Worcestershire sauce. Tartar sauce. Barbecue sauce. Teriyaki sauce. Soy sauce, including reduced sodium. Steak sauce. Fish sauce. Oyster sauce. Cocktail sauce. Horseradish. Ketchup and mustard. Meat flavorings and tenderizers. Bouillon cubes. Hot sauce. Tabasco sauce. Marinades. Taco seasonings. Relishes. °Fats and Oils °Butter, stick margarine, lard, shortening, ghee, and bacon fat. Coconut, palm kernel, or palm oils. Regular salad dressings. °Other °Pickles and olives. Salted popcorn and pretzels. °The items listed above may not be a complete list of foods and beverages to avoid. Contact your dietitian for more information. °WHERE CAN I FIND MORE INFORMATION? °National Heart, Lung, and Blood Institute: www.nhlbi.nih.gov/health/health-topics/topics/dash/ °Document Released: 11/06/2011 Document Revised: 04/03/2014 Document Reviewed: 09/21/2013 °ExitCare® Patient Information ©2015 [employer]. This information is not intended to replace advice given to you by your health care provider. Make sure you discuss any questions you have with your health care provider. ° °

## 2014-07-03 NOTE — Telephone Encounter (Signed)
New Prob    Pt states she went to the wellness center this morning and was told her heart is out of rhythm again per Dr. Holland Commons. Please call to advise.

## 2014-07-03 NOTE — Telephone Encounter (Signed)
Spoke with patient who states she was seen at [facility] this morning for evaluation of her diabetes by Holland Commons, NP and was told that her heart was out of rhythm again.  Patient did not have an ekg; reports she is taking Flecainide 100 mg BID and is on Coumadin with INR 2.3 as reported by our coumadin clinic. Patient denies SOB, states when she was originally diagnosed with a fib, she was "huffing and puffing."  She states she does not feel like that today; c/o only mild fatigue but states she has been very busy at work recently.  I advised patient that she needs EKG to make certain her rhythm is atrial fib and that we will determine plan of treatment from there.  I offered patient nurse visit appointment for today and patient states she is unable to come in today due to work; states she is off tomorrow and needs to come in tomorrow morning.  I scheduled patient for nurse visit tomorrow, 8/4 and advised her that I will be out of the office tomorrow and that Dr. Elease Hashimoto is on vacation this week.  I advised patient that after EKG is done, it will be taken to DOD and if treatment is needed he will prescribe or adjust medications.  Patient verbalized understanding and agreement.

## 2014-07-03 NOTE — Progress Notes (Signed)
Patient ID: Kelly Lang, female   DOB: May 31, 1961, 53 y.o.   MRN: [id number]  CC: "Blood glucose high"  HPI: Mrs. Graper presents today for a follow up on her diabetes type II. She has lancets and a meter and checks her sugar 1-2 times a day. She is currently on glucophage for diabetes control. She states her morning glucose is in the 200's and she does not eat during the day so that her glucose will come down to the 150's. Her current hemoglobin A1C is increased to 7.4 from 6.9. She denies any numbness or tingling, polydipsia, polyuria, or polyphagia. Her past medical history is positive for atrial-fibrillation, diabetes, dyslipidemia, and hypothyroidism.   Allergies  Allergen Reactions  . Codeine Itching and Rash   Past Medical History  Diagnosis Date  . Diabetes mellitus     a. hg A1c 10, newly diagnosed (10/26/13)  . Obesity   . H/O: hypothyroidism     a. thyroid biopsy 1996 with synthroid. Stopped taking rx and was loss to follow up b. normal thyroid function 10/20/13  . Arthritis   . Atrial fibrillation with RVR     a diagnosed 10/25/2013  . Tachycardia induced cardiomyopathy     a. 2/2 Afib with RVR for unknown duration (EF: 40-45%)  . Dyslipidemia   . Hx of cardiovascular stress test     ETT/Lexiscan Myoview (12/2013):  No ischemia; not gated; low risk.   Current Outpatient Prescriptions on File Prior to Visit  Medication Sig Dispense Refill  . acetaminophen (TYLENOL) 500 MG tablet Take 500 mg by mouth every 6 (six) hours as needed.      . flecainide (TAMBOCOR) 100 MG tablet Take 1 tablet (100 mg total) by mouth 2 (two) times daily.  180 tablet  3  . lisinopril (PRINIVIL,ZESTRIL) 2.5 MG tablet TAKE ONE TABLET BY MOUTH ONCE DAILY  30 tablet  0  . metFORMIN (GLUCOPHAGE) 500 MG tablet Take 2 tablets (1,000 mg total) by mouth 2 (two) times daily with a meal.  120 tablet  3  . metoprolol (LOPRESSOR) 50 MG tablet Take 1 tablet (50 mg total) by mouth 2 (two) times daily.  90 tablet   11  . warfarin (COUMADIN) 5 MG tablet Take as directed by anticoagulation clinic  60 tablet  2  . Vitamin D, Ergocalciferol, (DRISDOL) 50000 UNITS CAPS capsule Take 1 capsule (50,000 Units total) by mouth every 7 (seven) days.  12 capsule  0   No current facility-administered medications on file prior to visit.   History reviewed. No pertinent family history. History   Social History  . Marital Status: Divorced    Spouse Name: N/A    Number of Children: N/A  . Years of Education: N/A   Occupational History  . banquet coordinator    Social History Main Topics  . Smoking status: Former Smoker -- 1.00 packs/day for 22 years    Types: Cigarettes    Quit date: 12/01/2010  . Smokeless tobacco: Never Used  . Alcohol Use: Yes     Comment: 10/25/2013 "glass of wine maybe q other month"  . Drug Use: No  . Sexual Activity: Not Currently   Other Topics Concern  . Not on file   Social History Narrative   Moved to [place] from [place] in 2002. She has only seen one medical doctor in that time period when she had insurance. 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    Review of Systems  Respiratory:  Negative.   Cardiovascular: Negative for chest pain.  Genitourinary: Negative for frequency.  Endo/Heme/Allergies: Negative for polydipsia.     Objective:   Filed Vitals:   07/03/14 0929  BP: 118/66  Pulse: 85  Temp: 97.7 F (36.5 C)  Resp: 18    Physical Exam  Constitutional: She is oriented to person, place, and time. Vital signs are normal. She appears well-developed and well-nourished.  HENT:  Head: Normocephalic.  Eyes: Conjunctivae are normal.  Neck: No mass and no thyromegaly present.  Cardiovascular: Normal rate.  An irregularly irregular rhythm present.  Pulmonary/Chest: Effort normal and breath sounds normal.  Abdominal: Soft. Bowel sounds are normal.  Neurological: She is alert and oriented to person, place, and time. She has normal  strength.  Skin: Skin is warm, dry and intact.  Psychiatric: She has a normal mood and affect. Her speech is normal and behavior is normal. Judgment and thought content normal. Cognition and memory are normal.  patient is tearful     Lab Results  Component Value Date   WBC 7.7 11/29/2013   HGB 14.3 11/29/2013   HCT 42.3 11/29/2013   MCV 93.7 11/29/2013   PLT 234.0 11/29/2013   Lab Results  Component Value Date   CREATININE 0.62 01/16/2014   BUN 13 01/16/2014   NA 134* 01/16/2014   K 4.4 01/16/2014   CL 105 01/16/2014   CO2 21 01/16/2014    Lab Results  Component Value Date   HGBA1C 7.4 07/03/2014   Lipid Panel     Component Value Date/Time   CHOL 163 01/16/2014 0920   TRIG 153* 01/16/2014 0920   HDL 34* 01/16/2014 0920   CHOLHDL 4.8 01/16/2014 0920   VLDL 31 01/16/2014 0920   LDLCALC 98 01/16/2014 0920       Assessment and plan:   Harriett Sineancy was seen today for diabetes.  Diagnoses and associated orders for this visit:  Other specified diabetes mellitus with unspecified complications - Glucose (CBG) - HgB A1c - Added glipiZIDE (GLUCOTROL) 5 MG tablet; Take 1 tablet (5 mg total) by mouth daily before breakfast.   Return in about 3 months (around 10/03/2014) for Diabetes Mellitus and pap.       Holland CommonsKECK, VALERIE, NP-C [facility] [phone] 07/09/2014, 8:24 PM

## 2014-07-03 NOTE — Progress Notes (Signed)
Patient presents for f/u on DM Patient is crying. States she hasn't gotten a lot of sleep over last 3-4 days due to work.

## 2014-07-04 ENCOUNTER — Ambulatory Visit (INDEPENDENT_AMBULATORY_CARE_PROVIDER_SITE_OTHER): Payer: BC Managed Care – PPO | Admitting: *Deleted

## 2014-07-04 VITALS — BP 142/85 | HR 70 | Resp 18 | Wt 241.0 lb

## 2014-07-04 DIAGNOSIS — I4891 Unspecified atrial fibrillation: Secondary | ICD-10-CM

## 2014-07-04 NOTE — Progress Notes (Signed)
1.) Reason for visit: EKG per Eligha Bridegroom, RN and Dr Elease Hashimoto  2.) Name of MD requesting visit: Dr Nahser/ Eligha Bridegroom RN/  Holland Commons, NP  3.) H&P: Pt has a history of Atrial fibrillation.   4.)ROS related to problem: Pt came for a EKG per Eligha Bridegroom RN telephone note on 8/3. Pt was seen at the [facility] yesterday for evaluation of her diabetes by Holland Commons, NP. Per Vikki Ports, on her assessment, the pt was told her heart was out of rhythm and she should go and see her Cardiologist for further evaluation. Pt is being compliant with all meds prescribed. Pt presented today with no cardiac complaints and asymptomatic. Pt states "I never felt like I was back in afib." "I feel perfectly fine." Pt v/s- 142/85-70 regular-18.  Pt denies any sob or cp. EKG performed and interpreted by DOD Dr Eden Emms. Per Dr Eden Emms the pt is in NSR, and should continue on current treatment regimen. Will forward this to pts primary Cardiologist for further review.   5.)Assessment and plan per MD: Per DOD Dr Eden Emms, the pt is in NSR and should continue on current treatment regimen.

## 2014-07-28 ENCOUNTER — Other Ambulatory Visit: Payer: Self-pay | Admitting: Cardiovascular Disease

## 2014-08-09 ENCOUNTER — Ambulatory Visit (INDEPENDENT_AMBULATORY_CARE_PROVIDER_SITE_OTHER): Payer: BC Managed Care – PPO | Admitting: *Deleted

## 2014-08-09 DIAGNOSIS — Z5181 Encounter for therapeutic drug level monitoring: Secondary | ICD-10-CM

## 2014-08-09 DIAGNOSIS — I4891 Unspecified atrial fibrillation: Secondary | ICD-10-CM

## 2014-08-09 DIAGNOSIS — Z7901 Long term (current) use of anticoagulants: Secondary | ICD-10-CM

## 2014-08-09 LAB — POCT INR: INR: 1.9

## 2014-08-16 ENCOUNTER — Ambulatory Visit (INDEPENDENT_AMBULATORY_CARE_PROVIDER_SITE_OTHER): Payer: BC Managed Care – PPO | Admitting: Cardiovascular Disease

## 2014-08-16 ENCOUNTER — Encounter: Payer: Self-pay | Admitting: Cardiovascular Disease

## 2014-08-16 ENCOUNTER — Ambulatory Visit (INDEPENDENT_AMBULATORY_CARE_PROVIDER_SITE_OTHER): Payer: BC Managed Care – PPO | Admitting: *Deleted

## 2014-08-16 VITALS — BP 112/76 | HR 65 | Ht 65.0 in | Wt 242.0 lb

## 2014-08-16 DIAGNOSIS — Z5181 Encounter for therapeutic drug level monitoring: Secondary | ICD-10-CM

## 2014-08-16 DIAGNOSIS — I4891 Unspecified atrial fibrillation: Secondary | ICD-10-CM

## 2014-08-16 DIAGNOSIS — E785 Hyperlipidemia, unspecified: Secondary | ICD-10-CM

## 2014-08-16 DIAGNOSIS — Z7901 Long term (current) use of anticoagulants: Secondary | ICD-10-CM

## 2014-08-16 LAB — POCT INR: INR: 1.9

## 2014-08-16 NOTE — Progress Notes (Signed)
[address] [place], [place]  [address] Phone: [phone] Fax:  [phone]  Date:  08/16/2014   ID:  Kelly Lang, DOB Jun 10, 1961, MRN [id number]  PCP:  Holland Commons, NP  Cardiologist:  Dr. Delane Ginger    Problem List: 1. Atrial fibrillation 2. Hypothyroidism 3. Type 2 diabetes mellitus  History of Present Illness: Kelly Lang is a 53 y.o. female with a hx of T2DM, HL, thyroid disease and atrial fibrillation.  She had been on Synthroid for some time after a thyroid bx in 1996.  She was admitted 10/2013 with AFib with RVR.  Echo (10/26/13):  EF 40-45%, normal wall motion, mild LAE.  CHADS2-VASc= 3 (LV dysfunction, DM, female).  She was placed on coumadin.  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  She was last seen 11/29/2013.  She was set up for DCCV after 3-4 weeks of appropriate anticoagulation.  DCCV was performed 12/07/2013 but was unsuccessful.  She returns for follow up.  She is stable. She is frustrated with her atrial fibrillation. She does get tired more easily. She denies chest pain. She denies significant dyspnea. She is NYHA class II. She denies syncope. She denies orthopnea, PND, edema, palpitations.  Feb. 16, 2015:  She had a cardioversion twice.  Converted back to Afib both times. She has had a stress myoview which was negative for ischemia.    we will start flecainide today.  February 15, 2014:  Kelly Lang returns for further evaluation of her atrial fib.  She has been on Flecainide 100 BID for a month.  She started feeling better about 2 weeks ago.  She has converted to NSR.   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    Sept. 16, 2015:  Kelly Lang is doing well.  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     Recent Labs: 10/25/2013: TSH 1.020  11/29/2013: Hemoglobin 14.3  01/16/2014: ALT 28; Creatinine 0.62; HDL Cholesterol by NMR 34*; LDL (calc) 98; Potassium 4.4  08/09/2014:  POC INR 1.9   Wt Readings from Last 3 Encounters:  08/16/14 242 lb (109.77 kg)  07/04/14 241 lb (109.317 kg)  07/03/14 241 lb 9.6 oz (109.589 kg)     Past Medical History  Diagnosis Date  . Diabetes mellitus     a. hg A1c 10, newly diagnosed (10/26/13)  . Obesity   . H/O: hypothyroidism     a. thyroid biopsy 1996 with synthroid. Stopped taking rx and was loss to follow up b. normal thyroid function 10/20/13  . Arthritis   . Atrial fibrillation with RVR     a diagnosed 10/25/2013  . Tachycardia induced cardiomyopathy     a. 2/2 Afib with RVR for unknown duration (EF: 40-45%)  . Dyslipidemia   . Hx of cardiovascular stress test     ETT/Lexiscan Myoview (12/2013):  No ischemia; not gated; low risk.    Current Outpatient Prescriptions  Medication Sig Dispense Refill  . acetaminophen (TYLENOL) 500 MG tablet Take 500 mg by mouth every 6 (six) hours as needed for mild pain.       . flecainide (TAMBOCOR) 100 MG tablet Take 1 tablet (100 mg total) by mouth 2 (two) times daily.  180 tablet  3  . glipiZIDE (GLUCOTROL) 5 MG tablet Take 1 tablet (5 mg total) by mouth daily before breakfast.  30 tablet  3  . lisinopril (PRINIVIL,ZESTRIL) 2.5 MG tablet TAKE ONE  TABLET BY MOUTH ONCE DAILY  30 tablet  0  . metFORMIN (GLUCOPHAGE) 500 MG tablet Take 2 tablets (1,000 mg total) by mouth 2 (two) times daily with a meal.  120 tablet  3  . metoprolol (LOPRESSOR) 50 MG tablet Take 1 tablet (50 mg total) by mouth 2 (two) times daily.  90 tablet  11  . warfarin (COUMADIN) 5 MG tablet Take as directed by anticoagulation clinic  60 tablet  2   No current facility-administered medications for this visit.    Allergies:   Codeine   Social History:  The patient  reports that she quit smoking about 3 years ago. Her smoking use included Cigarettes. She has a 22 pack-year smoking history. She has never used smokeless tobacco. She reports that she drinks alcohol. She reports that she does not use illicit drugs.    Family History:  The patient's family history is not on file.   ROS:  Please see the history of present illness.   She denies any bleeding problems.   All other systems reviewed and negative.   PHYSICAL EXAM: VS:  BP 112/76  Pulse 65  Ht  (1.651 m)  Wt 242 lb (109.77 kg)  BMI 40.27 kg/m2 Well nourished, well developed, in no acute distress HEENT: normal Neck: no JVD Cardiac:  normal S1, S2; irregularly irregular rhythm; no murmur Lungs:  clear to auscultation bilaterally, no wheezing, rhonchi or rales Abd: soft, nontender, no hepatomegaly Ext: no edema Skin: warm and dry Neuro:  CNs 2-12 intact, no focal abnormalities noted  EKG:   ASSESSMENT AND PLAN:

## 2014-08-16 NOTE — Assessment & Plan Note (Signed)
Lipids have improved.   Will check lipids, liver, BMP in 6 months with her office visit.

## 2014-08-16 NOTE — Patient Instructions (Addendum)
Check the price of the following anticoagulants:  Pradaxa 150 mg twice a day Xarelto 20 mg a day Eliquis 5 mg twice a day Savaysa 60 mg a day.  Your physician recommends that you continue on your current medications as directed. Please refer to the Current Medication list given to you today.  Your physician wants you to follow-up in: 6 months with Dr. Elease Hashimoto.  You will receive a reminder letter in the mail two months in advance. If you don't receive a letter, please call our office to schedule the follow-up appointment. Your physician recommends that you return for lab work in: 6 months on the day of or a few days before your office visit with Dr. Elease Hashimoto.  You will need to FAST for this appointment - nothing to eat or drink after midnight the night before except water.

## 2014-08-16 NOTE — Assessment & Plan Note (Signed)
Next is doing well. She has maintained sinus rhythm. Her INR levels have been okay. Her last INR was 1.9. We'll recheck her INR today.  I've given her a list of the NOACs and she will see if her insurance will cover one of them.    I'll see her in 6 months with ECG. Continue same meds Encouraged her to exercise

## 2014-08-27 ENCOUNTER — Other Ambulatory Visit (HOSPITAL_COMMUNITY): Payer: Self-pay | Admitting: Cardiovascular Disease

## 2014-09-04 ENCOUNTER — Ambulatory Visit (INDEPENDENT_AMBULATORY_CARE_PROVIDER_SITE_OTHER): Payer: BC Managed Care – PPO

## 2014-09-04 DIAGNOSIS — Z7901 Long term (current) use of anticoagulants: Secondary | ICD-10-CM

## 2014-09-04 DIAGNOSIS — I4891 Unspecified atrial fibrillation: Secondary | ICD-10-CM

## 2014-09-04 DIAGNOSIS — Z5181 Encounter for therapeutic drug level monitoring: Secondary | ICD-10-CM

## 2014-09-04 LAB — POCT INR: INR: 2.2

## 2014-10-02 ENCOUNTER — Ambulatory Visit (INDEPENDENT_AMBULATORY_CARE_PROVIDER_SITE_OTHER): Payer: BC Managed Care – PPO

## 2014-10-02 DIAGNOSIS — I4891 Unspecified atrial fibrillation: Secondary | ICD-10-CM

## 2014-10-02 DIAGNOSIS — Z5181 Encounter for therapeutic drug level monitoring: Secondary | ICD-10-CM

## 2014-10-02 DIAGNOSIS — Z7901 Long term (current) use of anticoagulants: Secondary | ICD-10-CM

## 2014-10-02 LAB — POCT INR: INR: 1.8

## 2014-10-14 ENCOUNTER — Other Ambulatory Visit: Payer: Self-pay | Admitting: Internal Medicine

## 2014-10-30 ENCOUNTER — Ambulatory Visit (INDEPENDENT_AMBULATORY_CARE_PROVIDER_SITE_OTHER): Payer: BC Managed Care – PPO | Admitting: Surgery

## 2014-10-30 DIAGNOSIS — Z7901 Long term (current) use of anticoagulants: Secondary | ICD-10-CM

## 2014-10-30 DIAGNOSIS — Z5181 Encounter for therapeutic drug level monitoring: Secondary | ICD-10-CM

## 2014-10-30 DIAGNOSIS — I4891 Unspecified atrial fibrillation: Secondary | ICD-10-CM

## 2014-10-30 LAB — POCT INR: INR: 1.3

## 2014-10-31 ENCOUNTER — Other Ambulatory Visit: Payer: Self-pay | Admitting: Internal Medicine

## 2014-11-06 ENCOUNTER — Other Ambulatory Visit: Payer: Self-pay | Admitting: Internal Medicine

## 2014-11-06 DIAGNOSIS — E1110 Type 2 diabetes mellitus with ketoacidosis without coma: Secondary | ICD-10-CM

## 2014-11-06 MED ORDER — METFORMIN HCL 500 MG PO TABS
1000.0000 mg | ORAL_TABLET | Freq: Two times a day (BID) | ORAL | Status: DC
Start: 2014-11-06 — End: 2014-11-06

## 2014-11-06 MED ORDER — METFORMIN HCL 500 MG PO TABS: 1000.0000 mg | ORAL_TABLET | Freq: Two times a day (BID) | ORAL | Status: DC

## 2014-11-06 NOTE — Telephone Encounter (Signed)
Pt. Came into facility to request a med refill for metFORMIN (GLUCOPHAGE) 500 MG tablet, pt. States that [facility] has sent multiple requests for refills but has not herd anything back. Please f/u with pt.

## 2014-11-13 ENCOUNTER — Encounter: Payer: Self-pay | Admitting: Family

## 2014-11-13 ENCOUNTER — Other Ambulatory Visit (INDEPENDENT_AMBULATORY_CARE_PROVIDER_SITE_OTHER): Payer: BC Managed Care – PPO

## 2014-11-13 ENCOUNTER — Ambulatory Visit (INDEPENDENT_AMBULATORY_CARE_PROVIDER_SITE_OTHER): Payer: BC Managed Care – PPO | Admitting: Family

## 2014-11-13 DIAGNOSIS — E119 Type 2 diabetes mellitus without complications: Secondary | ICD-10-CM

## 2014-11-13 DIAGNOSIS — E118 Type 2 diabetes mellitus with unspecified complications: Secondary | ICD-10-CM

## 2014-11-13 LAB — BASIC METABOLIC PANEL
BUN: 12 mg/dL (ref 6–23)
CO2: 24 meq/L (ref 19–32)
Calcium: 8.7 mg/dL (ref 8.4–10.5)
Chloride: 103 mEq/L (ref 96–112)
Creatinine, Ser: 0.6 mg/dL (ref 0.4–1.2)
GFR: 123.53 mL/min (ref >60.00)
Glucose, Bld: 153 mg/dL — ABNORMAL HIGH (ref 70–99)
Potassium: 4.3 mEq/L (ref 3.5–5.1)
SODIUM: 135 meq/L (ref 135–145)

## 2014-11-13 LAB — MICROALBUMIN / CREATININE URINE RATIO
CREATININE, U: 112 mg/dL
MICROALB UR: 2.9 mg/dL — AB (ref 0.0–1.9)
Microalb Creat Ratio: 2.6 mg/g (ref 0.0–30.0)

## 2014-11-13 LAB — TSH: TSH: 0.95 u[IU]/mL (ref 0.35–4.50)

## 2014-11-13 LAB — HEMOGLOBIN A1C: Hgb A1c MFr Bld: 8.1 % — ABNORMAL HIGH (ref 4.6–6.5)

## 2014-11-13 MED ORDER — GLIPIZIDE 5 MG PO TABS: ORAL_TABLET | ORAL | Status: DC

## 2014-11-13 NOTE — Progress Notes (Signed)
Subjective:    Patient ID: Kelly DewNancy Kemple, female    DOB: 09/04/1961, 53 y.o.   MRN: [id number]  Chief Complaint  Patient presents with  . Establish Care    wants a diabetes check up sugar was 178 this morning    HPI:  Kelly Lang is a 53 y.o. female who presents today to establish care and discuss her diabetes.   [facility]. She is currently maintained on glipizide and metformin. Denies any stomach cramping or hypoglycemic events.  Denies any changes in vision. Her last diabetic eye exam was in September/October. Diabetic foot exam is overdue. Dental exam is overdue. Denies numbness, tingling, excessive hunger, excessive thirst. Declines flu shot at this time.   Lab Results  Component Value Date   HGBA1C 7.4 07/03/2014   Allergies  Allergen Reactions  . Codeine Itching and Rash   Current Outpatient Prescriptions on File Prior to Visit  Medication Sig Dispense Refill  . flecainide (TAMBOCOR) 100 MG tablet Take 1 tablet (100 mg total) by mouth 2 (two) times daily. 180 tablet 3  . glipiZIDE (GLUCOTROL) 5 MG tablet TAKE ONE TABLET BY MOUTH ONCE DAILY BEFORE BREAKFAST 30 tablet 0  . lisinopril (PRINIVIL,ZESTRIL) 2.5 MG tablet TAKE ONE TABLET BY MOUTH ONCE DAILY 30 tablet 5  . metFORMIN (GLUCOPHAGE) 500 MG tablet Take 2 tablets (1,000 mg total) by mouth 2 (two) times daily with a meal. 180 tablet 3  . metoprolol (LOPRESSOR) 50 MG tablet Take 1 tablet (50 mg total) by mouth 2 (two) times daily. 90 tablet 11  . warfarin (COUMADIN) 5 MG tablet TAKE AS DIRECTED BY ANTICOAGULATION CLINIC 60 tablet 3   No current facility-administered medications on file prior to visit.   Past Medical History  Diagnosis Date  . Diabetes mellitus     a. hg A1c 10, newly diagnosed (10/26/13)  . Obesity   . H/O: hypothyroidism     a. thyroid biopsy 1996 with synthroid. Stopped taking rx and was loss to follow up b. normal thyroid function  10/20/13  . Arthritis   . Atrial fibrillation with RVR     a diagnosed 10/25/2013  . Tachycardia induced cardiomyopathy     a. 2/2 Afib with RVR for unknown duration (EF: 40-45%)  . Dyslipidemia   . Hx of cardiovascular stress test     ETT/Lexiscan Myoview (12/2013):  No ischemia; not gated; low risk.  . Chicken pox   . Allergy   . Thyroid disease    Past Surgical History  Procedure Laterality Date  . Biopsy thyroid      1996  . Tonsillectomy  1971  . Eye muscle surgery Right ~ 1966  . Cardioversion N/A 12/07/2013    Procedure: CARDIOVERSION;  Surgeon: Everette RankJay Varanasi, MD;  [facility];  Service: Cardiovascular;  Laterality: N/A;   Past Surgical History  Procedure Laterality Date  . Biopsy thyroid      1996  . Tonsillectomy  1971  . Eye muscle surgery Right ~ 1966  . Cardioversion N/A 12/07/2013    Procedure: CARDIOVERSION;  Surgeon: Everette RankJay Varanasi, MD;  [facility];  Service: Cardiovascular;  Laterality: N/A;   No family history on file.  History   Social History  . Marital Status: Divorced    Spouse Name: N/A    Number of Children: 0  . Years of Education: 14   Occupational History  . banquet coordinator    Social History Main Topics  .  Smoking status: Former Smoker -- 1.00 packs/day for 22 years    Types: Cigarettes    Quit date: 12/01/2010  . Smokeless tobacco: Never Used  . Alcohol Use: Yes     Comment: 10/25/2013 "glass of wine maybe q other month"  . Drug Use: No  . Sexual Activity: Not Currently   Other Topics Concern  . Not on file   Social History Narrative   Moved to [place] from [place] in 2002. She has only seen one medical doctor in that time period when she had insurance. 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   Fun: shop, sew, decorate   Denies any beliefs effecting healthcare    Review of Systems    See HPI  Objective:    BP 124/88 mmHg  Pulse 54  Temp(Src) 97.9 F (36.6 C) (Oral)   Resp 18  Ht 5' 5.5" (1.664 m)  Wt 246 lb 6.4 oz (111.766 kg)  BMI 40.36 kg/m2  SpO2 97% Nursing note and vital signs reviewed.  Physical Exam  Constitutional: She is oriented to person, place, and time. She appears well-developed and well-nourished. No distress.  Cardiovascular: Normal rate, regular rhythm, normal heart sounds and intact distal pulses.   Pulmonary/Chest: Effort normal and breath sounds normal.  Musculoskeletal:  Diabetic foot exam - bilateral skin is intact with no evidence of skin damage. Mild calluses noted medial side of left calcaneus. Pulses are present and appropriate. Decreased sensation to monofilament and bilateral toes. Sensation otherwise intact and appropriate.  Neurological: She is alert and oriented to person, place, and time.  Skin: Skin is warm and dry.  Psychiatric: She has a normal mood and affect. Her behavior is normal. Judgment and thought content normal.      Assessment & Plan:

## 2014-11-13 NOTE — Progress Notes (Signed)
Pre visit review using our clinic review tool, if applicable. No additional management support is needed unless otherwise documented below in the visit note. 

## 2014-11-13 NOTE — Assessment & Plan Note (Addendum)
Previous A1c is 7.4. Overdue for diabetic dental exam. She is up-to-date on her eye exam. Foot exam completed today. Continue current glipizide and metformin at this time pending new A1c result. Obtain A1c, basic metabolic panel, and urine microalbumin. Patient declines flu shot at this time. Follow up pending lab work results.

## 2014-11-13 NOTE — Patient Instructions (Addendum)
Thank you for choosing [facility].  Summary/Instructions:  Your prescription(s) have been submitted to your pharmacy. Please take as directed and contact our office if you believe you are having problem(s) with the medication(s).  Please stop by the lab on the basement level of the building for your blood work. Your results will be released to MyChart (or called to you) after review, usually within 72hours after test completion. If any changes need to be made, you will be notified at that same time.  Linagliptin oral tablets What is this medicine? Linagliptin (lin a GLIP tin) helps to treat type 2 diabetes. It helps to control blood sugar. Treatment is combined with diet and exercise. This medicine may be used for other purposes; ask your health care provider or pharmacist if you have questions. COMMON BRAND NAME(S): Tradjenta What should I tell my health care provider before I take this medicine? They need to know if you have any of these conditions: -diabetic ketoacidosis -type 1 diabetes -an unusual or allergic reaction to linagliptin, other medicines, foods, dyes, or preservatives -pregnant or trying to get pregnant -breast-feeding How should I use this medicine? Take this medicine by mouth with a glass of water. Follow the directions on the prescription label. You can take it with or without food. Take your dose at the same time each day. Do not take more often than directed. Do not stop taking except on your doctor's advice. A special MedGuide will be given to you by the pharmacist with each prescription and refill. Be sure to read this information carefully each time. Talk to your pediatrician regarding the use of this medicine in children. Special care may be needed. Overdosage: If you think you've taken too much of this medicine contact a poison control center or emergency room at once. Overdosage: If you think you have taken too much of this medicine contact a poison  control center or emergency room at once. NOTE: This medicine is only for you. Do not share this medicine with others. What if I miss a dose? If you miss a dose, take it as soon as you can. If it is almost time for your next dose, take only that dose. Do not take double or extra doses. What may interact with this medicine? Do not take this medicine with any of the following medications: -gatifloxacin This medicine may also interact with the following medications: -alcohol -bosentan -certain medicines for seizures like carbamazepine, phenobarbital, phenytoin -rifabutin -rifampin -St. Johns Wort -sulfonylureas like glimepiride, glipizide, glyburide This list may not describe all possible interactions. Give your health care provider a list of all the medicines, herbs, non-prescription drugs, or dietary supplements you use. Also tell them if you smoke, drink alcohol, or use illegal drugs. Some items may interact with your medicine. What should I watch for while using this medicine? Visit your doctor or health care professional for regular checks on your progress. A test called the HbA1C (A1C) will be monitored. This is a simple blood test. It measures your blood sugar control over the last 2 to 3 months. You will receive this test every 3 to 6 months. Learn how to check your blood sugar. Learn the symptoms of low and high blood sugar and how to manage them. Always carry a quick-source of sugar with you in case you have symptoms of low blood sugar. Examples include hard sugar candy or glucose tablets. Make sure others know that you can choke if you eat or drink when you develop serious  symptoms of low blood sugar, such as seizures or unconsciousness. They must get medical help at once. Tell your doctor or health care professional if you have high blood sugar. You might need to change the dose of your medicine. If you are sick or exercising more than usual, you might need to change the dose of your  medicine. Do not skip meals. Ask your doctor or health care professional if you should avoid alcohol. Many nonprescription cough and cold products contain sugar or alcohol. These can affect blood sugar. Wear a medical ID bracelet or chain, and carry a card that describes your disease and details of your medicine and dosage times. What side effects may I notice from receiving this medicine? Side effects that you should report to your doctor or health care professional as soon as possible: -allergic reactions like skin rash, itching or hives, swelling of the face, lips, or tongue -breathing problems -fever, chills -nausea, vomiting -signs and symptoms of low blood sugar such as feeling anxious, confusion, dizziness, increased hunger, unusually weak or tired, sweating, shakiness, cold, irritable, headache, blurred vision, fast heartbeat, loss of consciousness -unusual stomach pain or discomfort -vomiting Side effects that usually do not require medical attention (Report these to your doctor or health care professional if they continue or are bothersome.): -headache -sore throat -stuffy or runny nose This list may not describe all possible side effects. Call your doctor for medical advice about side effects. You may report side effects to FDA at 1-800-FDA-1088. Where should I keep my medicine? Keep out of the reach of children. Store at room temperature between 15 and 30 degrees C (59 and 86 degrees F). Throw away any unused medicine after the expiration date. NOTE: This sheet is a summary. It may not cover all possible information. If you have questions about this medicine, talk to your doctor, pharmacist, or health care provider.  2015, Elsevier/Gold Standard. (2014-01-12 11:16:32)

## 2014-11-14 MED ORDER — LINAGLIPTIN 5 MG PO TABS: 5.0000 mg | ORAL_TABLET | Freq: Every day | ORAL | Status: DC

## 2014-11-17 NOTE — Telephone Encounter (Signed)
Called patient to inform of one time refill until patient comes in for appointment. Patient did not answer. Medication was escribed to patient preferred pharmacy.

## 2014-12-01 DIAGNOSIS — I639 Cerebral infarction, unspecified: Secondary | ICD-10-CM

## 2014-12-01 HISTORY — DX: Cerebral infarction, unspecified: I63.9

## 2015-01-09 ENCOUNTER — Other Ambulatory Visit: Payer: Self-pay

## 2015-01-09 ENCOUNTER — Telehealth: Payer: Self-pay | Admitting: Family

## 2015-01-09 MED ORDER — LINAGLIPTIN 5 MG PO TABS: 5.0000 mg | ORAL_TABLET | Freq: Every day | ORAL | Status: DC

## 2015-01-09 MED ORDER — GLIPIZIDE 5 MG PO TABS: ORAL_TABLET | ORAL | Status: DC

## 2015-01-09 NOTE — Telephone Encounter (Signed)
Patient is requesting a refill of trajenta and glipizide.

## 2015-01-09 NOTE — Telephone Encounter (Signed)
Rx sent 

## 2015-01-11 ENCOUNTER — Ambulatory Visit (INDEPENDENT_AMBULATORY_CARE_PROVIDER_SITE_OTHER): Payer: 59

## 2015-01-11 DIAGNOSIS — Z7901 Long term (current) use of anticoagulants: Secondary | ICD-10-CM

## 2015-01-11 DIAGNOSIS — I4891 Unspecified atrial fibrillation: Secondary | ICD-10-CM

## 2015-01-11 DIAGNOSIS — Z5181 Encounter for therapeutic drug level monitoring: Secondary | ICD-10-CM

## 2015-01-11 LAB — POCT INR: INR: 1.9

## 2015-01-24 ENCOUNTER — Other Ambulatory Visit: Payer: Self-pay | Admitting: *Deleted

## 2015-01-24 ENCOUNTER — Other Ambulatory Visit: Payer: Self-pay | Admitting: Physician Assistant

## 2015-01-31 ENCOUNTER — Other Ambulatory Visit: Payer: Self-pay | Admitting: *Deleted

## 2015-01-31 MED ORDER — WARFARIN SODIUM 5 MG PO TABS: ORAL_TABLET | ORAL | Status: DC

## 2015-02-05 ENCOUNTER — Other Ambulatory Visit: Payer: Self-pay | Admitting: Cardiovascular Disease

## 2015-02-13 ENCOUNTER — Encounter: Payer: Self-pay | Admitting: Cardiovascular Disease

## 2015-02-13 ENCOUNTER — Other Ambulatory Visit (INDEPENDENT_AMBULATORY_CARE_PROVIDER_SITE_OTHER): Payer: 59 | Admitting: *Deleted

## 2015-02-13 ENCOUNTER — Ambulatory Visit (INDEPENDENT_AMBULATORY_CARE_PROVIDER_SITE_OTHER): Payer: 59 | Admitting: Cardiovascular Disease

## 2015-02-13 ENCOUNTER — Ambulatory Visit (INDEPENDENT_AMBULATORY_CARE_PROVIDER_SITE_OTHER): Payer: 59 | Admitting: *Deleted

## 2015-02-13 VITALS — BP 126/90 | HR 63 | Ht 65.5 in | Wt 237.1 lb

## 2015-02-13 DIAGNOSIS — Z5181 Encounter for therapeutic drug level monitoring: Secondary | ICD-10-CM

## 2015-02-13 DIAGNOSIS — Z7901 Long term (current) use of anticoagulants: Secondary | ICD-10-CM

## 2015-02-13 DIAGNOSIS — E785 Hyperlipidemia, unspecified: Secondary | ICD-10-CM

## 2015-02-13 DIAGNOSIS — I4891 Unspecified atrial fibrillation: Secondary | ICD-10-CM

## 2015-02-13 DIAGNOSIS — I10 Essential (primary) hypertension: Secondary | ICD-10-CM

## 2015-02-13 LAB — LIPID PANEL
CHOLESTEROL: 175 mg/dL (ref 0–200)
HDL: 36.2 mg/dL — AB (ref >39.00)
NonHDL: 138.8
TRIGLYCERIDES: 212 mg/dL — AB (ref 0.0–149.0)
Total CHOL/HDL Ratio: 5
VLDL: 42.4 mg/dL — AB (ref 0.0–40.0)

## 2015-02-13 LAB — HEPATIC FUNCTION PANEL
ALT: 22 U/L (ref 0–35)
AST: 14 U/L (ref 0–37)
Albumin: 3.7 g/dL (ref 3.5–5.2)
Alkaline Phosphatase: 55 U/L (ref 39–117)
BILIRUBIN DIRECT: 0.1 mg/dL (ref 0.0–0.3)
BILIRUBIN TOTAL: 0.3 mg/dL (ref 0.2–1.2)
Total Protein: 6.9 g/dL (ref 6.0–8.3)

## 2015-02-13 LAB — BASIC METABOLIC PANEL
BUN: 13 mg/dL (ref 6–23)
CHLORIDE: 104 meq/L (ref 96–112)
CO2: 28 mEq/L (ref 19–32)
Calcium: 8.8 mg/dL (ref 8.4–10.5)
Creatinine, Ser: 0.62 mg/dL (ref 0.40–1.20)
GFR: 107.47 mL/min (ref >60.00)
Glucose, Bld: 135 mg/dL — ABNORMAL HIGH (ref 70–99)
Potassium: 4.3 mEq/L (ref 3.5–5.1)
Sodium: 135 mEq/L (ref 135–145)

## 2015-02-13 LAB — LDL CHOLESTEROL, DIRECT: Direct LDL: 114 mg/dL

## 2015-02-13 LAB — POCT INR: INR: 2

## 2015-02-13 NOTE — Patient Instructions (Signed)
Your physician recommends that you continue on your current medications as directed. Please refer to the Current Medication list given to you today.  Your physician wants you to follow-up in: 6 months with Dr. Nahser.  You will receive a reminder letter in the mail two months in advance. If you don't receive a letter, please call our office to schedule the follow-up appointment.  

## 2015-02-13 NOTE — Addendum Note (Signed)
Addended by: Tonita Phoenix on: 02/13/2015 07:50 AM   Modules accepted: Orders

## 2015-02-13 NOTE — Progress Notes (Signed)
Cardiology Office Note   Date:  02/13/2015   ID:  Kelly Lang, DOB 06/25/1961, MRN [id number]  PCP:  Jeanine Luz, FNP  Cardiologist:   Vesta Mixer, MD   No chief complaint on file.  1. Atrial fibrillation 2. Hypothyroidism 3. Type 2 diabetes mellitus  History of Present Illness: Kelly Lang is a 54 y.o. female with a hx of T2DM, HL, thyroid disease and atrial fibrillation. She had been on Synthroid for some time after a thyroid bx in 1996. She was admitted 10/2013 with AFib with RVR. Echo (10/26/13): EF 40-45%, normal wall motion, mild LAE. CHADS2-VASc= 3 (LV dysfunction, DM, female). She was placed on coumadin. 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 She was last seen 11/29/2013. She was set up for DCCV after 3-4 weeks of appropriate anticoagulation. DCCV was performed 12/07/2013 but was unsuccessful. She returns for follow up.  She is stable. She is frustrated with her atrial fibrillation. She does get tired more easily. She denies chest pain. She denies significant dyspnea. She is NYHA class II. She denies syncope. She denies orthopnea, PND, edema, palpitations.  Feb. 16, 2015:  She had a cardioversion twice. Converted back to Afib both times. She has had a stress myoview which was negative for ischemia. we will start flecainide today.  February 15, 2014:  Kelly Lang returns for further evaluation of her atrial fib. She has been on Flecainide 100 BID for a month. She started feeling better about 2 weeks ago. She has converted to NSR. 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   Sept. 16, 2015:  Kelly Lang is doing well. 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    February 13, 2015:  Kelly Lang is a 54 y.o. female who presents for follow-up of atrial fibrillation. Is on coumadin . INR is 2.0 this am.   No CP or dyspnea.   Glucose levels are ok.   HbA1C is a bit high . Was  started on Tragenta. Only a little bit of exercise.   Missed 4 days of flecainide due to insurance not paying for it. Staying in NSR   Past Medical History  Diagnosis Date  . Diabetes mellitus     a. hg A1c 10, newly diagnosed (10/26/13)  . Obesity   . H/O: hypothyroidism     a. thyroid biopsy 1996 with synthroid. Stopped taking rx and was loss to follow up b. normal thyroid function 10/20/13  . Arthritis   . Atrial fibrillation with RVR     a diagnosed 10/25/2013  . Tachycardia induced cardiomyopathy     a. 2/2 Afib with RVR for unknown duration (EF: 40-45%)  . Dyslipidemia   . Hx of cardiovascular stress test     ETT/Lexiscan Myoview (12/2013):  No ischemia; not gated; low risk.  . Chicken pox   . Allergy   . Thyroid disease     Past Surgical History  Procedure Laterality Date  . Biopsy thyroid      1996  . Tonsillectomy  1971  . Eye muscle surgery Right ~ 1966  . Cardioversion N/A 12/07/2013    Procedure: CARDIOVERSION;  Surgeon: Everette Rank, MD;  Location: [facility] ENDOSCOPY;  Service: Cardiovascular;  Laterality: N/A;     Current Outpatient Prescriptions  Medication Sig Dispense Refill  . flecainide (TAMBOCOR) 100 MG tablet TAKE ONE TABLET BY MOUTH TWICE DAILY 180 tablet 1  . glipiZIDE (GLUCOTROL) 5 MG tablet TAKE  ONE TABLET BY MOUTH ONCE DAILY BEFORE BREAKFAST 30 tablet 2  . linagliptin (TRADJENTA) 5 MG TABS tablet Take 1 tablet (5 mg total) by mouth daily. 30 tablet 2  . lisinopril (PRINIVIL,ZESTRIL) 2.5 MG tablet TAKE ONE TABLET BY MOUTH ONCE DAILY 30 tablet 5  . metFORMIN (GLUCOPHAGE) 500 MG tablet Take 2 tablets (1,000 mg total) by mouth 2 (two) times daily with a meal. 180 tablet 3  . metoprolol (LOPRESSOR) 50 MG tablet Take 1 tablet (50 mg total) by mouth 2 (two) times daily. 90 tablet 11  . metoprolol (LOPRESSOR) 50 MG tablet Take 1 tablet (50 mg total) by mouth 2 (two) times daily. 60 tablet 1  . warfarin (COUMADIN) 5 MG tablet TAKE AS DIRECTED BY ANTICOAGULATION  CLINIC 60 tablet 0   No current facility-administered medications for this visit.    Allergies:   Codeine    Social History:  The patient  reports that she quit smoking about 4 years ago. Her smoking use included Cigarettes. She has a 22 pack-year smoking history. She has never used smokeless tobacco. She reports that she drinks alcohol. She reports that she does not use illicit drugs.   Family History:  The patient's family history is not on file.    ROS:  Please see the history of present illness.    Review of Systems: Constitutional:  denies fever, chills, diaphoresis, appetite change and fatigue.  HEENT: denies photophobia, eye pain, redness, hearing loss, ear pain, congestion, sore throat, rhinorrhea, sneezing, neck pain, neck stiffness and tinnitus.  Respiratory: denies SOB, DOE, cough, chest tightness, and wheezing.  Cardiovascular: denies chest pain, palpitations and leg swelling.  Gastrointestinal: denies nausea, vomiting, abdominal pain, diarrhea, constipation, blood in stool.  Genitourinary: denies dysuria, urgency, frequency, hematuria, flank pain and difficulty urinating.  Musculoskeletal: denies  myalgias, back pain, joint swelling, arthralgias and gait problem.   Skin: denies pallor, rash and wound.  Neurological: denies dizziness, seizures, syncope, weakness, light-headedness, numbness and headaches.   Hematological: denies adenopathy, easy bruising, personal or family bleeding history.  Psychiatric/ Behavioral: denies suicidal ideation, mood changes, confusion, nervousness, sleep disturbance and agitation.       All other systems are reviewed and negative.    PHYSICAL EXAM: VS:  There were no vitals taken for this visit. , BMI There is no weight on file to calculate BMI. GEN: Well nourished, well developed, in no acute distress HEENT: normal Neck: no JVD, carotid bruits, or masses Cardiac: RRR; no murmurs, rubs, or gallops,no edema  Respiratory:  clear to  auscultation bilaterally, normal work of breathing GI: soft, nontender, nondistended, + BS MS: no deformity or atrophy Skin: warm and dry, no rash Neuro:  Strength and sensation are intact Psych: normal   EKG:  EKG is ordered today. The ekg ordered today demonstrates :  NSR at 561.  NS ST T abn.    Recent Labs: 11/13/2014: BUN 12; Creatinine 0.6; Potassium 4.3; Sodium 135; TSH 0.95    Lipid Panel    Component Value Date/Time   CHOL 163 01/16/2014 0920   TRIG 153* 01/16/2014 0920   HDL 34* 01/16/2014 0920   CHOLHDL 4.8 01/16/2014 0920   VLDL 31 01/16/2014 0920   LDLCALC 98 01/16/2014 0920      Wt Readings from Last 3 Encounters:  11/13/14 246 lb 6.4 oz (111.766 kg)  08/16/14 242 lb (109.77 kg)  07/04/14 241 lb (109.317 kg)      Other studies Reviewed: Additional studies/ records that were reviewed today  include: . Review of the above records demonstrates:    ASSESSMENT AND PLAN:  1. Atrial fibrillation- has maintained normal sinus rhythm. Continue current Flecainide and metoprolol  2. Hypothyroidism - followed by her medical doctor   3. Type 2 diabetes mellitus - has been started on Tragenta .    Current medicines are reviewed at length with the patient today.  The patient does not have concerns regarding medicines.  The following changes have been made:  no change  Labs/ tests ordered today include:  No orders of the defined types were placed in this encounter.     Disposition:   FU with     Signed, Prabhjot Maddux, [name], MD  02/13/2015 6:21 AM    [facility] [address] [place], [place], [place]  [address] Phone: [phone]; Fax: [phone]

## 2015-02-16 ENCOUNTER — Telehealth: Payer: Self-pay | Admitting: Nurse Practitioner

## 2015-02-16 DIAGNOSIS — E785 Hyperlipidemia, unspecified: Secondary | ICD-10-CM

## 2015-02-16 MED ORDER — ATORVASTATIN CALCIUM 40 MG PO TABS: 40.0000 mg | ORAL_TABLET | Freq: Every day | ORAL | Status: DC

## 2015-02-16 NOTE — Telephone Encounter (Signed)
-----   Message from Vesta Mixer, MD sent at 02/14/2015  5:24 PM EDT ----- Kelly Lang and cholesterol are elevated.  Start atorvastatin 40 a day. She needs to work on a diet and exercise program

## 2015-02-16 NOTE — Telephone Encounter (Signed)
Reviewed lab results and plan of care with patient who verbalized understanding and agreement to start Atorvastatin 40 mg once daily.  Rx to [facility] on [address].  Patient is scheduled for repeat fasting cholesterol, liver, bmet in 3 months.  I advised patient to call back with questions or concerns.

## 2015-05-25 ENCOUNTER — Telehealth: Payer: Self-pay

## 2015-05-25 NOTE — Telephone Encounter (Signed)
Pt does not have any health insurance at this moment.

## 2015-05-29 ENCOUNTER — Other Ambulatory Visit: Payer: 59

## 2015-09-14 ENCOUNTER — Emergency Department (HOSPITAL_COMMUNITY): Payer: Self-pay

## 2015-09-14 ENCOUNTER — Inpatient Hospital Stay (HOSPITAL_COMMUNITY)
Admission: EM | Admit: 2015-09-14 | Discharge: 2015-09-16 | DRG: 065 | Disposition: A | Discharge status: Home / Self Care | Payer: Self-pay | Attending: Internal Medicine | Admitting: Internal Medicine

## 2015-09-14 ENCOUNTER — Encounter (HOSPITAL_COMMUNITY): Payer: Self-pay | Admitting: Nurse Practitioner

## 2015-09-14 DIAGNOSIS — I5022 Chronic systolic (congestive) heart failure: Secondary | ICD-10-CM | POA: Diagnosis present

## 2015-09-14 DIAGNOSIS — E1165 Type 2 diabetes mellitus with hyperglycemia: Secondary | ICD-10-CM | POA: Diagnosis present

## 2015-09-14 DIAGNOSIS — M545 Low back pain, unspecified: Secondary | ICD-10-CM | POA: Diagnosis present

## 2015-09-14 DIAGNOSIS — I110 Hypertensive heart disease with heart failure: Secondary | ICD-10-CM | POA: Diagnosis present

## 2015-09-14 DIAGNOSIS — I63412 Cerebral infarction due to embolism of left middle cerebral artery: Principal | ICD-10-CM | POA: Insufficient documentation

## 2015-09-14 DIAGNOSIS — Z794 Long term (current) use of insulin: Secondary | ICD-10-CM

## 2015-09-14 DIAGNOSIS — Z87891 Personal history of nicotine dependence: Secondary | ICD-10-CM

## 2015-09-14 DIAGNOSIS — I63512 Cerebral infarction due to unspecified occlusion or stenosis of left middle cerebral artery: Secondary | ICD-10-CM

## 2015-09-14 DIAGNOSIS — T50996A Underdosing of other drugs, medicaments and biological substances, initial encounter: Secondary | ICD-10-CM | POA: Diagnosis present

## 2015-09-14 DIAGNOSIS — R4701 Aphasia: Secondary | ICD-10-CM | POA: Diagnosis present

## 2015-09-14 DIAGNOSIS — Z598 Other problems related to housing and economic circumstances: Secondary | ICD-10-CM

## 2015-09-14 DIAGNOSIS — I4891 Unspecified atrial fibrillation: Secondary | ICD-10-CM | POA: Diagnosis present

## 2015-09-14 DIAGNOSIS — T383X6A Underdosing of insulin and oral hypoglycemic [antidiabetic] drugs, initial encounter: Secondary | ICD-10-CM | POA: Diagnosis present

## 2015-09-14 DIAGNOSIS — E785 Hyperlipidemia, unspecified: Secondary | ICD-10-CM | POA: Diagnosis present

## 2015-09-14 DIAGNOSIS — R7989 Other specified abnormal findings of blood chemistry: Secondary | ICD-10-CM

## 2015-09-14 DIAGNOSIS — Z8639 Personal history of other endocrine, nutritional and metabolic disease: Secondary | ICD-10-CM

## 2015-09-14 DIAGNOSIS — E079 Disorder of thyroid, unspecified: Secondary | ICD-10-CM

## 2015-09-14 DIAGNOSIS — I10 Essential (primary) hypertension: Secondary | ICD-10-CM | POA: Diagnosis present

## 2015-09-14 DIAGNOSIS — E119 Type 2 diabetes mellitus without complications: Secondary | ICD-10-CM | POA: Diagnosis present

## 2015-09-14 DIAGNOSIS — Y92009 Unspecified place in unspecified non-institutional (private) residence as the place of occurrence of the external cause: Secondary | ICD-10-CM

## 2015-09-14 DIAGNOSIS — E1142 Type 2 diabetes mellitus with diabetic polyneuropathy: Secondary | ICD-10-CM | POA: Diagnosis present

## 2015-09-14 DIAGNOSIS — E669 Obesity, unspecified: Secondary | ICD-10-CM | POA: Diagnosis present

## 2015-09-14 DIAGNOSIS — E1159 Type 2 diabetes mellitus with other circulatory complications: Secondary | ICD-10-CM | POA: Diagnosis present

## 2015-09-14 DIAGNOSIS — I639 Cerebral infarction, unspecified: Secondary | ICD-10-CM | POA: Diagnosis present

## 2015-09-14 DIAGNOSIS — E039 Hypothyroidism, unspecified: Secondary | ICD-10-CM | POA: Diagnosis present

## 2015-09-14 DIAGNOSIS — I5023 Acute on chronic systolic (congestive) heart failure: Secondary | ICD-10-CM

## 2015-09-14 DIAGNOSIS — Z6839 Body mass index (BMI) 39.0-39.9, adult: Secondary | ICD-10-CM

## 2015-09-14 DIAGNOSIS — Z9114 Patient's other noncompliance with medication regimen: Secondary | ICD-10-CM

## 2015-09-14 DIAGNOSIS — Z91120 Patient's intentional underdosing of medication regimen due to financial hardship: Secondary | ICD-10-CM

## 2015-09-14 DIAGNOSIS — R739 Hyperglycemia, unspecified: Secondary | ICD-10-CM | POA: Insufficient documentation

## 2015-09-14 DIAGNOSIS — I429 Cardiomyopathy, unspecified: Secondary | ICD-10-CM

## 2015-09-14 DIAGNOSIS — I749 Embolism and thrombosis of unspecified artery: Secondary | ICD-10-CM | POA: Diagnosis present

## 2015-09-14 DIAGNOSIS — N390 Urinary tract infection, site not specified: Secondary | ICD-10-CM | POA: Diagnosis present

## 2015-09-14 LAB — GLUCOSE, CAPILLARY: Glucose-Capillary: 155 mg/dL — ABNORMAL HIGH (ref 65–99)

## 2015-09-14 LAB — LIPID PANEL
CHOL/HDL RATIO: 6 ratio
Cholesterol: 185 mg/dL (ref 0–200)
HDL: 31 mg/dL — ABNORMAL LOW (ref >40)
LDL CALC: 117 mg/dL — AB (ref 0–99)
TRIGLYCERIDES: 185 mg/dL — AB (ref <150)
VLDL: 37 mg/dL (ref 0–40)

## 2015-09-14 LAB — URINALYSIS, ROUTINE W REFLEX MICROSCOPIC
Bilirubin Urine: NEGATIVE
GLUCOSE, UA: 500 mg/dL — AB
Hgb urine dipstick: NEGATIVE
Ketones, ur: NEGATIVE mg/dL
NITRITE: POSITIVE — AB
PH: 5 (ref 5.0–8.0)
Protein, ur: 30 mg/dL — AB
SPECIFIC GRAVITY, URINE: 1.031 — AB (ref 1.005–1.030)
Urobilinogen, UA: 0.2 mg/dL (ref 0.0–1.0)

## 2015-09-14 LAB — BASIC METABOLIC PANEL
Anion gap: 8 (ref 5–15)
BUN: 16 mg/dL (ref 6–20)
CHLORIDE: 103 mmol/L (ref 101–111)
CO2: 24 mmol/L (ref 22–32)
CREATININE: 0.65 mg/dL (ref 0.44–1.00)
Calcium: 9 mg/dL (ref 8.9–10.3)
GFR calc non Af Amer: >60 mL/min (ref >60)
Glucose, Bld: 216 mg/dL — ABNORMAL HIGH (ref 65–99)
POTASSIUM: 4.5 mmol/L (ref 3.5–5.1)
Sodium: 135 mmol/L (ref 135–145)

## 2015-09-14 LAB — URINE MICROSCOPIC-ADD ON

## 2015-09-14 LAB — CBG MONITORING, ED
Glucose-Capillary: 190 mg/dL — ABNORMAL HIGH (ref 65–99)
Glucose-Capillary: 167 mg/dL — ABNORMAL HIGH (ref 65–99)

## 2015-09-14 LAB — CBC
HEMATOCRIT: 43.4 % (ref 36.0–46.0)
HEMOGLOBIN: 14.7 g/dL (ref 12.0–15.0)
MCH: 31.8 pg (ref 26.0–34.0)
MCHC: 33.9 g/dL (ref 30.0–36.0)
MCV: 93.9 fL (ref 78.0–100.0)
PLATELETS: 246 10*3/uL (ref 150–400)
RBC: 4.62 MIL/uL (ref 3.87–5.11)
RDW: 12.5 % (ref 11.5–15.5)
WBC: 8.2 10*3/uL (ref 4.0–10.5)

## 2015-09-14 LAB — BRAIN NATRIURETIC PEPTIDE: B NATRIURETIC PEPTIDE 5: 323.7 pg/mL — AB (ref 0.0–100.0)

## 2015-09-14 MED ORDER — DEXTROSE 5 % IV SOLN
1.0000 g | Freq: Once | INTRAVENOUS | Status: AC
  Administered 2015-09-14: 1 g via INTRAVENOUS
  Filled 2015-09-14: qty 10

## 2015-09-14 MED ORDER — INSULIN ASPART 100 UNIT/ML ~~LOC~~ SOLN: 0.0000 [IU] | Freq: Every day | SUBCUTANEOUS | Status: DC

## 2015-09-14 MED ORDER — DEXTROSE 5 % IV SOLN
1.0000 g | INTRAVENOUS | Status: DC
  Filled 2015-09-14: qty 10

## 2015-09-14 MED ORDER — ACETAMINOPHEN 325 MG PO TABS
650.0000 mg | ORAL_TABLET | Freq: Four times a day (QID) | ORAL | Status: DC | PRN
  Administered 2015-09-14 – 2015-09-16 (×5): 650 mg via ORAL
  Filled 2015-09-14 (×5): qty 2

## 2015-09-14 MED ORDER — ASPIRIN 325 MG PO TABS
325.0000 mg | ORAL_TABLET | Freq: Every day | ORAL | Status: DC
  Administered 2015-09-14 – 2015-09-15 (×2): 325 mg via ORAL
  Filled 2015-09-14 (×2): qty 1

## 2015-09-14 MED ORDER — ENOXAPARIN SODIUM 30 MG/0.3ML ~~LOC~~ SOLN
30.0000 mg | SUBCUTANEOUS | Status: DC
  Administered 2015-09-14: 30 mg via SUBCUTANEOUS
  Filled 2015-09-14: qty 0.3

## 2015-09-14 MED ORDER — ATORVASTATIN CALCIUM 10 MG PO TABS
10.0000 mg | ORAL_TABLET | Freq: Every day | ORAL | Status: DC
  Administered 2015-09-15: 10 mg via ORAL
  Filled 2015-09-14: qty 1

## 2015-09-14 MED ORDER — SENNOSIDES-DOCUSATE SODIUM 8.6-50 MG PO TABS: 1.0000 | ORAL_TABLET | Freq: Every evening | ORAL | Status: DC | PRN

## 2015-09-14 MED ORDER — WARFARIN SODIUM 7.5 MG PO TABS
7.5000 mg | ORAL_TABLET | Freq: Once | ORAL | Status: AC
  Administered 2015-09-14: 7.5 mg via ORAL
  Filled 2015-09-14: qty 1

## 2015-09-14 MED ORDER — STROKE: EARLY STAGES OF RECOVERY BOOK
Freq: Once | Status: AC
  Administered 2015-09-14: 21:00:00

## 2015-09-14 MED ORDER — INSULIN ASPART 100 UNIT/ML ~~LOC~~ SOLN
0.0000 [IU] | Freq: Three times a day (TID) | SUBCUTANEOUS | Status: DC
  Administered 2015-09-15: 3 [IU] via SUBCUTANEOUS
  Administered 2015-09-15: 5 [IU] via SUBCUTANEOUS
  Administered 2015-09-15 – 2015-09-16 (×3): 3 [IU] via SUBCUTANEOUS

## 2015-09-14 MED ORDER — WARFARIN - PHARMACIST DOSING INPATIENT: Freq: Every day | Status: DC

## 2015-09-14 NOTE — ED Notes (Signed)
Pt cbg 190

## 2015-09-14 NOTE — H&P (Signed)
[facility] History and Physical  Kelly Lang FSE:[id number] DOB: Jul 23, 1961 DOA: 09/14/2015  Referring physician: [place] PCP: Jeanine Luz, FNP   Chief Complaint: difficulty speaking  HPI: Kelly Lang is a 54 y.o. female with a past medical history that includes A. fib, diabetes, obesity, hypertension, dyslipidemia presents to emergency department with the chief complaint of difficulty speaking. 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  Patient reports that yesterday vision in her right eye was somewhat blurry for a brief period and then this morning she awakened and was having difficulty getting the correct words out. Associated symptoms include mild headache and unsteady gait. She reports leaning to the right. She denies chest pain palpitations shortness of breath abdominal pain nausea vomiting. She denies any fever chills dysuria hematuria frequency or urgency. He reports she has not been to any medical care provider since March of this year and she has not had insurance and/or money to manage her A. fib her diabetes or blood pressure.  Workup in the emergency department includes basic metabolic panel significant for serum glucose of 216 otherwise unremarkable, complete blood count unremarkable, proBNP slightly elevated at 323, urinalysis consistent with urinary tract infection, MRN the brain as noted above. Chest x-ray Mild edema and effusions suggesting congestive heart failure. No focal airspace consolidation. EKG with A. Fib.  In the emergency department she is provided with Rocephin for UTI She is hemodynamically stable afebrile and not hypoxic.   Review of Systems:  10 point review of systems complete and all systems are negative except as indicated in the history of present illness Past Medical History  Diagnosis Date  . Diabetes mellitus (HCC)     a. hg A1c 10, newly diagnosed (10/26/13)  . Obesity   . H/O: hypothyroidism     a.  thyroid biopsy 1996 with synthroid. Stopped taking rx and was loss to follow up b. normal thyroid function 10/20/13  . Arthritis   . Atrial fibrillation with RVR (HCC)     a diagnosed 10/25/2013  . Tachycardia induced cardiomyopathy (HCC)     a. 2/2 Afib with RVR for unknown duration (EF: 40-45%)  . Dyslipidemia   . Hx of cardiovascular stress test     ETT/Lexiscan Myoview (12/2013):  No ischemia; not gated; low risk.  . Chicken pox   . Allergy   . Thyroid disease    Past Surgical History  Procedure Laterality Date  . Biopsy thyroid      1996  . Tonsillectomy  1971  . Eye muscle surgery Right ~ 1966  . Cardioversion N/A 12/07/2013    Procedure: CARDIOVERSION;  Surgeon: Everette Rank, MD;  Location: [facility] ENDOSCOPY;  Service: Cardiovascular;  Laterality: N/A;   Social History:  reports that she quit smoking about 4 years ago. Her smoking use included Cigarettes. She has a 22 pack-year smoking history. She has never used smokeless tobacco. She reports that she drinks alcohol. She reports that she does not use illicit drugs. 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 Codeine Itching and Rash    Family History  Problem Relation Age of Onset  . Hypertension Mother   . Hyperlipidemia Mother    the parents she knows are adopted and she is unaware of her biological parents medical history  Prior to Admission medications   Medication Sig Start Date End Date Taking? Authorizing Provider  acetaminophen (TYLENOL) 500 MG tablet Take 1,000 mg by  mouth every 6 (six) hours as needed for moderate pain.   Yes Historical Provider, MD   Physical Exam: Filed Vitals:   09/14/15 1430 09/14/15 1445 09/14/15 1515 09/14/15 1644  BP: 114/79 113/83 121/90   Pulse: 72 74 93   Temp:    98.1 F (36.7 C)  TempSrc:      Resp:      Height:      Weight:      SpO2: 94% 94% 91%     Wt Readings from Last 3 Encounters:  09/14/15 106.913 kg  (235 lb 11.2 oz)  02/13/15 107.557 kg (237 lb 1.9 oz)  11/13/14 111.766 kg (246 lb 6.4 oz)    General:  Appears calm and comfortable, obese Eyes: PERRL, normal lids, irises & conjunctiva ENT: grossly normal hearing, lips & tongue no deviation of the tongue Neck: no LAD, masses or thyromegaly Cardiovascular: Irregularly irregular, no m/r/g. No LE edema.  Respiratory: CTA bilaterally, no w/r/r. Normal respiratory effort. Abdomen: soft, ntnd is a bowel sounds throughout no guarding or rebounding Skin: no rash or induration seen on limited exam Musculoskeletal: grossly normal tone BUE/BLE Psychiatric: grossly normal mood and affect, speech fluent and appropriate Neurologic: grossly non-focal. Speech is clear evidence of some intermittent word searching. Otherwise grossly nonfocal           Labs on Admission:  Basic Metabolic Panel:  Recent Labs Lab 09/14/15 1234  NA 135  K 4.5  CL 103  CO2 24  GLUCOSE 216*  BUN 16  CREATININE 0.65  CALCIUM 9.0   Liver Function Tests: No results for input(s): AST, ALT, ALKPHOS, BILITOT, PROT, ALBUMIN in the last 168 hours. No results for input(s): LIPASE, AMYLASE in the last 168 hours. No results for input(s): AMMONIA in the last 168 hours. CBC:  Recent Labs Lab 09/14/15 1234  WBC 8.2  HGB 14.7  HCT 43.4  MCV 93.9  PLT 246   Cardiac Enzymes: No results for input(s): CKTOTAL, CKMB, CKMBINDEX, TROPONINI in the last 168 hours.  BNP (last 3 results)  Recent Labs  09/14/15 1524  BNP 323.7*    ProBNP (last 3 results) No results for input(s): PROBNP in the last 8760 hours.  CBG:  Recent Labs Lab 09/14/15 1156  GLUCAP 190*    Radiological Exams on Admission: Dg Chest 2 View  09/14/2015  CLINICAL DATA:  Atrial fibrillation.  Hyperglycemia. EXAM: CHEST - 2 VIEW COMPARISON:  One-view chest 10/25/2013. FINDINGS: The heart size is exaggerated by low lung volumes. Mild interstitial edema is present. Small effusions are noted.  No significant airspace consolidation is present. The visualized soft tissues and bony thorax are unremarkable. IMPRESSION: 1. Mild edema and effusions suggesting congestive heart failure. 2. No focal airspace consolidation Electronically Signed   By: Marin Roberts M.D.   On: 09/14/2015 12:53   Mr Brain Wo Contrast (neuro Protocol)  09/14/2015  CLINICAL DATA:  Expressive aphasia. Atrial fibrillation, diabetes, dyslipidemia, obesity. Headache and dizziness and visual disturbance. EXAM: MRI HEAD WITHOUT CONTRAST TECHNIQUE: Multiplanar, multiecho pulse sequences of the brain and surrounding structures were obtained without intravenous contrast. COMPARISON:  None. FINDINGS: Acute infarct in the left temporoparietal lobe measuring approximately 3 x 4 cm. This is likely embolic given the history. No other areas of acute or chronic infarction. Focal micro hemorrhage in the left occipital lobe, separate from the acute infarct. This may be due to a prior embolus or hypertensive bleed. Basal ganglia and brainstem normal. Cerebellum normal. Ventricle size normal. Negative for  mass or edema.  No shift of the midline structures. Paranasal sinuses are clear. IMPRESSION: Acute infarct left temporoparietal lobe, probably in the embolic infarct given the history of atrial fibrillation. Chronic micro hemorrhage left occipital lobe Electronically Signed   By: Marlan Palau M.D.   On: 09/14/2015 16:19    EKG: Independently reviewed A. fib  Assessment/Plan Principal Problem:   Embolic infarction [facility]): Per MRI in setting of A. fib not on anticoagulation. Will admit to telemetry. Evaluated by neurology in the emergency department. Will follow in the a.m. Will complete stroke workup per protocol. Will obtain MRI 2-D echo carotid Dopplers lipid panel hemoglobin A1c. PT and OT consult. Start aspirin and statin Active Problems:    Atrial fibrillation : Office visit with cardiology in March of this year has had no  follow-up or medications since due to lack of insurance. Noted that time indicates chads 2 Vascor 11 (LV dysfunction, diabetes, female) note indicates a cardiomyopathy that was like the related to tachycardia. Check INR and start Coumadin per pharmacy Will get an echo cardiogram as noted above.   Type II diabetes mellitus with manifestations [facility]): Review indicates she has been on glipizide metformin in the past. Has not taken any diabetes medication since March of this year as noted above. Will obtain a hemoglobin A1c. We sliding scale insulin for optimal control. Diabetes coordinator consult    Dyslipidemia: Noted in her history. 10 lipid panel start a statin    Essential hypertension, benign: Review indicates she has been on low-dose lisinopril in the past and 50 mg of metoprolol twice a day. She has not taken his medication since April of this year. Hold them for now to allow for permissive hypertension. Will monitor closely    Low back pain: Stable at baseline.    H/O: hypothyroidism: On Synthroid in the past. Will obtain a TSH.    Obesity: EMI 39.3. Nutritional consult     Expressive aphasia: See #1. She has passed a bedside swallow eval so we will provide car modified heart healthy diet    UTI (lower urinary tract infection): Asymptomatic. Rocephin initiated in the emergency department we'll continue this. Will obtain a urine culture    Dr Hosie Poisson  Code Status: full DVT Prophylaxis: Family Communication: friend at bedside Disposition Plan: home hopefully 24 hours  Time spent: 65 minutes  [facility]

## 2015-09-14 NOTE — ED Notes (Signed)
She reports high blood sugar throughout the day yesterday. She also felt that she may have had some episodes of afib while at work yesterday. She noticed last night she was unable to see clearly out of R eye and she began to have trouble writing down her words, which worsened throughout the night. She c/o headache, dizziness sicne. She has been out of all her medications since April she lost her insurnace and does not have a doctor

## 2015-09-14 NOTE — Consult Note (Signed)
Referring Physician: Jeraldine Loots    Chief Complaint: stroke  HPI:                                                                                                                                         Kelly Lang is an 54 y.o. female presenting to hospital after noting yesterday that her vision in her right eye but today she noted that she could not get you words out correctly mainly single words and not every word.  But noticeable enough she was concerned.  She has no numbness, tingling or weakness. She does feel off balance when walking --tending to leaning to the right. MRI confirmed stroke.  She has not been taking her medications since April due to not having insurance.   Date last known well: Unable to determine Time last known well: Unable to determine tPA Given: No: out of window     Past Medical History  Diagnosis Date  . Diabetes mellitus (HCC)     a. hg A1c 10, newly diagnosed (10/26/13)  . Obesity   . H/O: hypothyroidism     a. thyroid biopsy 1996 with synthroid. Stopped taking rx and was loss to follow up b. normal thyroid function 10/20/13  . Arthritis   . Atrial fibrillation with RVR (HCC)     a diagnosed 10/25/2013  . Tachycardia induced cardiomyopathy (HCC)     a. 2/2 Afib with RVR for unknown duration (EF: 40-45%)  . Dyslipidemia   . Hx of cardiovascular stress test     ETT/Lexiscan Myoview (12/2013):  No ischemia; not gated; low risk.  . Chicken pox   . Allergy   . Thyroid disease     Past Surgical History  Procedure Laterality Date  . Biopsy thyroid      1996  . Tonsillectomy  1971  . Eye muscle surgery Right ~ 1966  . Cardioversion N/A 12/07/2013    Procedure: CARDIOVERSION;  Surgeon: Everette Rank, MD;  Location: [facility] ENDOSCOPY;  Service: Cardiovascular;  Laterality: N/A;    Family History  Problem Relation Age of Onset  . Hypertension Mother   . Hyperlipidemia Mother    Social History:  reports that she quit smoking about 4 years ago. Her smoking use  included Cigarettes. She has a 22 pack-year smoking history. She has never used smokeless tobacco. She reports that she drinks alcohol. She reports that she does not use illicit drugs.  Allergies:  Allergies  Allergen Reactions  . Codeine Itching and Rash    Medications:  Current Facility-Administered Medications  Medication Dose Route Frequency Provider Last Rate Last Dose  . cefTRIAXone (ROCEPHIN) 1 g in dextrose 5 % 50 mL IVPB  1 g Intravenous Once Gerhard Munch, MD       Current Outpatient Prescriptions  Medication Sig Dispense Refill  . acetaminophen (TYLENOL) 500 MG tablet Take 1,000 mg by mouth every 6 (six) hours as needed for moderate pain.       ROS:                                                                                                                                       History obtained from the patient  General ROS: negative for - chills, fatigue, fever, night sweats, weight gain or weight loss Psychological ROS: negative for - behavioral disorder, hallucinations, memory difficulties, mood swings or suicidal ideation Ophthalmic ROS: negative for - blurry vision, double vision, eye pain or loss of vision ENT ROS: negative for - epistaxis, nasal discharge, oral lesions, sore throat, tinnitus or vertigo Allergy and Immunology ROS: negative for - hives or itchy/watery eyes Hematological and Lymphatic ROS: negative for - bleeding problems, bruising or swollen lymph nodes Endocrine ROS: negative for - galactorrhea, hair pattern changes, polydipsia/polyuria or temperature intolerance Respiratory ROS: negative for - cough, hemoptysis, shortness of breath or wheezing Cardiovascular ROS: negative for - chest pain, dyspnea on exertion, edema or irregular heartbeat Gastrointestinal ROS: negative for - abdominal pain, diarrhea, hematemesis,  nausea/vomiting or stool incontinence Genito-Urinary ROS: negative for - dysuria, hematuria, incontinence or urinary frequency/urgency Musculoskeletal ROS: negative for - joint swelling or muscular weakness Neurological ROS: as noted in HPI Dermatological ROS: negative for rash and skin lesion changes  Neurologic Examination:                                                                                                      Blood pressure 113/83, pulse 74, temperature 98.5 F (36.9 C), temperature source Oral, resp. rate 16, height  (1.651 m), weight 106.913 kg (235 lb 11.2 oz), SpO2 94 %.  HEENT-  Normocephalic, no lesions, without obvious abnormality.  Normal external eye and conjunctiva.  Normal TM's bilaterally.  Normal auditory canals and external ears. Normal external nose, mucus membranes and septum.  Normal pharynx. Cardiovascular- S1, S2 normal, pulses palpable throughout   Lungs- chest clear, no wheezing, rales, normal symmetric air entry Abdomen- normal findings: bowel sounds normal Extremities- no edema Lymph-no adenopathy palpable Musculoskeletal-no joint tenderness, deformity or swelling Skin-warm and  dry, no hyperpigmentation, vitiligo, or suspicious lesions  Neurological Examination Mental Status: Alert, oriented, thought content appropriate.  Speech fluent with evidence of word finding difficullties.  Able to follow 3 step commands without difficulty. Cranial Nerves: II: Discs flat bilaterally; Visual fields grossly normal, pupils equal, round, reactive to light and accommodation III,IV, VI: ptosis not present, extra-ocular motions intact bilaterally V,VII: smile symmetric, facial light touch sensation normal bilaterally VIII: hearing normal bilaterally IX,X: uvula rises symmetrically XI: bilateral shoulder shrug XII: midline tongue extension Motor: Right : Upper extremity   5/5    Left:     Upper extremity   5/5  Lower extremity   5/5     Lower extremity    5/5 Tone and bulk:normal tone throughout; no atrophy noted Sensory: Pinprick and light touch intact throughout, bilaterally Deep Tendon Reflexes: 2+ and symmetric throughout Plantars: Right: downgoing   Left: downgoing Cerebellar: normal finger-to-nose, normal rapid alternating movements and normal heel-to-shin test Gait: not tested       Lab Results: Basic Metabolic Panel:  Recent Labs Lab 09/14/15 1234  NA 135  K 4.5  CL 103  CO2 24  GLUCOSE 216*  BUN 16  CREATININE 0.65  CALCIUM 9.0    Liver Function Tests: No results for input(s): AST, ALT, ALKPHOS, BILITOT, PROT, ALBUMIN in the last 168 hours. No results for input(s): LIPASE, AMYLASE in the last 168 hours. No results for input(s): AMMONIA in the last 168 hours.  CBC:  Recent Labs Lab 09/14/15 1234  WBC 8.2  HGB 14.7  HCT 43.4  MCV 93.9  PLT 246    Cardiac Enzymes: No results for input(s): CKTOTAL, CKMB, CKMBINDEX, TROPONINI in the last 168 hours.  Lipid Panel: No results for input(s): CHOL, TRIG, HDL, CHOLHDL, VLDL, LDLCALC in the last 168 hours.  CBG:  Recent Labs Lab 09/14/15 1156  GLUCAP 190*    Microbiology: No results found for this or any previous visit.  Coagulation Studies: No results for input(s): LABPROT, INR in the last 72 hours.  Imaging: Dg Chest 2 View  09/14/2015  CLINICAL DATA:  Atrial fibrillation.  Hyperglycemia. EXAM: CHEST - 2 VIEW COMPARISON:  One-view chest 10/25/2013. FINDINGS: The heart size is exaggerated by low lung volumes. Mild interstitial edema is present. Small effusions are noted. No significant airspace consolidation is present. The visualized soft tissues and bony thorax are unremarkable. IMPRESSION: 1. Mild edema and effusions suggesting congestive heart failure. 2. No focal airspace consolidation Electronically Signed   By: Marin Roberts M.D.   On: 09/14/2015 12:53   Mr Brain Wo Contrast (neuro Protocol)  09/14/2015  CLINICAL DATA:  Expressive  aphasia. Atrial fibrillation, diabetes, dyslipidemia, obesity. Headache and dizziness and visual disturbance. EXAM: MRI HEAD WITHOUT CONTRAST TECHNIQUE: Multiplanar, multiecho pulse sequences of the brain and surrounding structures were obtained without intravenous contrast. COMPARISON:  None. FINDINGS: Acute infarct in the left temporoparietal lobe measuring approximately 3 x 4 cm. This is likely embolic given the history. No other areas of acute or chronic infarction. Focal micro hemorrhage in the left occipital lobe, separate from the acute infarct. This may be due to a prior embolus or hypertensive bleed. Basal ganglia and brainstem normal. Cerebellum normal. Ventricle size normal. Negative for mass or edema.  No shift of the midline structures. Paranasal sinuses are clear. IMPRESSION: Acute infarct left temporoparietal lobe, probably in the embolic infarct given the history of atrial fibrillation. Chronic micro hemorrhage left occipital lobe Electronically Signed   By: Delorise Jackson.D.  On: 09/14/2015 16:19       Assessment and plan discussed with with attending physician and they are in agreement.    Felicie Morn PA-C [facility] [phone]  09/14/2015, 4:28 PM   Assessment: 54 y.o. female with Acute infarct in the left temporoparietal lobe measuring approximately 3 x 4 cm in the setting of medication noncompliance. With history of A fib, on no anticoagulation, suspect embolic etiology.   Stroke Risk Factors - atrial fibrillation, diabetes mellitus and hyperlipidemia   Recommend: 1. HgbA1c, fasting lipid panel 2. MRI, MRA  of the brain without contrast 3. PT consult, OT consult, Speech consult 4. Echocardiogram 5. Carotid dopplers 6. Prophylactic therapy-Antiplatelet med: Aspirin - dose 325 and likely will need to go back on coumadin--stroke team to evaluate in AM 7. Risk factor modification 8. Telemetry monitoring 9. Frequent neuro checks 10 NPO until passes stroke  swallow screen  Elspeth Cho, DO [facility] [phone]  If 7pm- 7am, please page neurology on call as listed in AMION.

## 2015-09-14 NOTE — ED Notes (Signed)
Patient transported to MRI 

## 2015-09-14 NOTE — Progress Notes (Addendum)
Ceftriaxone for UTI per pharmacy ordered.  Ceftriaxone does not need renal adjustment.  [employer] policy allows pharmacy to change the ordered dose based on indication without contacting the physician, therefore a consult is not needed.  *noted patient is not having symptoms. It is typically not recommended to treat an asymptomatic UTI as any growth on culture could be colonization.  Plan: -ceftriaxone 1g IV q24h -pharmacy to sign off as no adjustment needed.   Marleen Moret D. Shakeel Disney, PharmD, BCPS Clinical Pharmacist Pager: [phone] 09/14/2015 7:49 PM

## 2015-09-14 NOTE — ED Notes (Signed)
The patient is here for evaluation of new onset headache, dizziness and visual disturbances along with some expressive aphasia that she reports herself when she was trying to write. Pt is speaking normally at present.  Separately, the pt also thinks she has had some runs of afib yesterday.

## 2015-09-14 NOTE — Progress Notes (Signed)
ANTICOAGULATION CONSULT NOTE - Initial Consult  Pharmacy Consult for warfarin Indication: atrial fibrillation  Allergies  Allergen Reactions  . Codeine Itching and Rash    Patient Measurements: Height: 5\' 5"  (165.1 cm) Weight: 235 lb 11.2 oz (106.913 kg) IBW/kg (Calculated) : 57   Vital Signs: Temp: 98.2 F (36.8 C) (10/14 1900) Temp Source: Oral (10/14 1900) BP: 122/88 mmHg (10/14 1900) Pulse Rate: 107 (10/14 1900)  Labs:  Recent Labs  09/14/15 1234  HGB 14.7  HCT 43.4  PLT 246  CREATININE 0.65    Estimated Creatinine Clearance: 100 mL/min (by C-G formula based on Cr of 0.65).   Assessment: 89 YOF who presented to the hospital after noting she was having vision issues yesterday, and then she felt as though she could not get words out correctly today. She was outside tPA window, and therefore this was not given. She has a history of AFib, but is not currently on medications at home as she lost her insurance in April and hasn't been taking any prescriptions since that time.  In March, her warfarin dosing was 7.5mg  daily except 5mg  on MWF.  Hgb 14.7, plts 246- no bleeding noted. Lovenox 30mg  has been ordered for VTE prophylaxis. This can be continued until INR is therapeutic.  Goal of Therapy:  INR 2-3 Monitor platelets by anticoagulation protocol: Yes   Plan:  -warfarin 7.5mg  po x1 tonight -daily CBC and INR -follow for s/s bleeding -f/u for any re-education needs  Royetta Probus D. Jennifier Smitherman, PharmD, BCPS Clinical Pharmacist Pager: [phone] 09/14/2015 7:50 PM

## 2015-09-14 NOTE — ED Notes (Signed)
Attempted report 

## 2015-09-14 NOTE — ED Provider Notes (Signed)
CSN: [id number]     Arrival date & time 09/14/15  1137 History   First MD Initiated Contact with Patient 09/14/15 1256     Chief Complaint  Patient presents with  . Aphasia     (Consider location/radiation/quality/duration/timing/severity/associated sxs/prior Treatment) HPI Patient presents with concern of new word finding difficulty, and recent right speech difficulty. Patient acknowledges a history of medication noncompliance, with a history of diabetes, atrial fibrillation. For these conditions, she was previously on insulin, Coumadin. She generally well until the past few days, and most notably yesterday, had an episode of transient right visual blurriness. This resolved entirely, but upon awakening today, patient had occult he with word finding. No new weakness in any extremity, no new confusion, disorientation. There is mild associated headache, dizziness, no falling, syncope. No ongoing visual changes. No clear precipitating, leaving, exacerbated factors.  Past Medical History  Diagnosis Date  . Diabetes mellitus (HCC)     a. hg A1c 10, newly diagnosed (10/26/13)  . Obesity   . H/O: hypothyroidism     a. thyroid biopsy 1996 with synthroid. Stopped taking rx and was loss to follow up b. normal thyroid function 10/20/13  . Arthritis   . Atrial fibrillation with RVR (HCC)     a diagnosed 10/25/2013  . Tachycardia induced cardiomyopathy (HCC)     a. 2/2 Afib with RVR for unknown duration (EF: 40-45%)  . Dyslipidemia   . Hx of cardiovascular stress test     ETT/Lexiscan Myoview (12/2013):  No ischemia; not gated; low risk.  . Chicken pox   . Allergy   . Thyroid disease    Past Surgical History  Procedure Laterality Date  . Biopsy thyroid      1996  . Tonsillectomy  1971  . Eye muscle surgery Right ~ 1966  . Cardioversion N/A 12/07/2013    Procedure: CARDIOVERSION;  Surgeon: Everette Rank, MD;  Location: [facility] ENDOSCOPY;  Service: Cardiovascular;  Laterality: N/A;   Family  History  Problem Relation Age of Onset  . Hypertension Mother   . Hyperlipidemia Mother    Social History  Substance Use Topics  . Smoking status: Former Smoker -- 1.00 packs/day for 22 years    Types: Cigarettes    Quit date: 12/01/2010  . Smokeless tobacco: Never Used  . Alcohol Use: Yes     Comment: 10/25/2013 "glass of wine maybe q other month"   OB History    No data available     Review of Systems  Constitutional:       Per HPI, otherwise negative  HENT:       Per HPI, otherwise negative  Eyes: Positive for visual disturbance.  Respiratory:       Per HPI, otherwise negative  Cardiovascular:       Per HPI, otherwise negative  Gastrointestinal: Negative for vomiting.  Endocrine:       Negative aside from HPI  Genitourinary:       Neg aside from HPI   Musculoskeletal:       Per HPI, otherwise negative  Skin: Negative.   Neurological: Positive for dizziness, weakness and headaches. Negative for syncope.      Allergies  Codeine  Home Medications   Prior to Admission medications   Medication Sig Start Date End Date Taking? Authorizing Provider  acetaminophen (TYLENOL) 500 MG tablet Take 1,000 mg by mouth every 6 (six) hours as needed for moderate pain.   Yes Historical Provider, MD   BP 113/83 mmHg  Pulse  74  Temp(Src) 98.5 F (36.9 C) (Oral)  Resp 16  Ht 5\' 5"  (1.651 m)  Wt 235 lb 11.2 oz (106.913 kg)  BMI 39.22 kg/m2  SpO2 94% Physical Exam  Constitutional: She is oriented to person, place, and time. She appears well-developed and well-nourished. No distress.  HENT:  Head: Normocephalic and atraumatic.  Eyes: Conjunctivae and EOM are normal.  Cardiovascular: Normal rate and regular rhythm.   Pulmonary/Chest: Effort normal and breath sounds normal. No stridor. No respiratory distress.  Abdominal: She exhibits no distension.  Musculoskeletal: She exhibits no edema.  Neurological: She is alert and oriented to person, place, and time. She displays no  atrophy and no tremor. No cranial nerve deficit or sensory deficit. She exhibits normal muscle tone. She displays no seizure activity. Coordination normal.  Obvious speech deficit, with expressive aphasia, but no facial asymmetry, and speech itself is clear.   Skin: Skin is warm and dry.  Psychiatric: She has a normal mood and affect.  Nursing note and vitals reviewed.   ED Course  Procedures (including critical care time) Labs Review Labs Reviewed  BASIC METABOLIC PANEL - Abnormal; Notable for the following:    Glucose, Bld 216 (*)    All other components within normal limits  URINALYSIS, ROUTINE W REFLEX MICROSCOPIC (NOT AT [facility]) - Abnormal; Notable for the following:    Color, Urine AMBER (*)    APPearance CLOUDY (*)    Specific Gravity, Urine 1.031 (*)    Glucose, UA 500 (*)    Protein, ur 30 (*)    Nitrite POSITIVE (*)    Leukocytes, UA TRACE (*)    All other components within normal limits  URINE MICROSCOPIC-ADD ON - Abnormal; Notable for the following:    Bacteria, UA MANY (*)    Casts HYALINE CASTS (*)    All other components within normal limits  BRAIN NATRIURETIC PEPTIDE - Abnormal; Notable for the following:    B Natriuretic Peptide 323.7 (*)    All other components within normal limits  CBG MONITORING, ED - Abnormal; Notable for the following:    Glucose-Capillary 190 (*)    All other components within normal limits  CBC  CBG MONITORING, ED    Imaging Review Dg Chest 2 View  09/14/2015  CLINICAL DATA:  Atrial fibrillation.  Hyperglycemia. EXAM: CHEST - 2 VIEW COMPARISON:  One-view chest 10/25/2013. FINDINGS: The heart size is exaggerated by low lung volumes. Mild interstitial edema is present. Small effusions are noted. No significant airspace consolidation is present. The visualized soft tissues and bony thorax are unremarkable. IMPRESSION: 1. Mild edema and effusions suggesting congestive heart failure. 2. No focal airspace consolidation Electronically Signed    By: Marin Roberts M.D.   On: 09/14/2015 12:53   Mr Brain Wo Contrast (neuro Protocol)  09/14/2015  CLINICAL DATA:  Expressive aphasia. Atrial fibrillation, diabetes, dyslipidemia, obesity. Headache and dizziness and visual disturbance. EXAM: MRI HEAD WITHOUT CONTRAST TECHNIQUE: Multiplanar, multiecho pulse sequences of the brain and surrounding structures were obtained without intravenous contrast. COMPARISON:  None. FINDINGS: Acute infarct in the left temporoparietal lobe measuring approximately 3 x 4 cm. This is likely embolic given the history. No other areas of acute or chronic infarction. Focal micro hemorrhage in the left occipital lobe, separate from the acute infarct. This may be due to a prior embolus or hypertensive bleed. Basal ganglia and brainstem normal. Cerebellum normal. Ventricle size normal. Negative for mass or edema.  No shift of the midline structures. Paranasal sinuses  are clear. IMPRESSION: Acute infarct left temporoparietal lobe, probably in the embolic infarct given the history of atrial fibrillation. Chronic micro hemorrhage left occipital lobe Electronically Signed   By: Marlan Palau M.D.   On: 09/14/2015 16:19   I have personally reviewed and evaluated these images and lab results as part of my medical decision-making.   EKG Interpretation   Date/Time:  Friday September 14 2015 12:11:20 EDT Ventricular Rate:  87 PR Interval:    QRS Duration: 94 QT Interval:  388 QTC Calculation: 466 R Axis:   -25 Text Interpretation:  Atrial fibrillation Septal infarct , age  undetermined Abnormal ECG Atrial fibrillation Left axis deviation T wave  abnormality Abnormal ekg Confirmed by Gerhard Munch  MD ([id number]) on  09/14/2015 2:26:40 PM     Cardiac 85 A. fib abnormal Pulse ox 99% room air normal  Initial labs notable for urinary tract infection. Patient receiving antibiotics. Patient also was found to have mild elevated BNP.   On repeat exam, patient remains calm,  is aware of all findings.   After discussion with our hospitalist and neurology colleagues, the patient was admitted.  MDM  Patient presents with new aphasia, after one day of dizziness. Last normal time was at least 24 hours ago. Notably, the patient has a history of atrial fibrillation, has had no insurance, been unable to afford any medication, including Coumadin. In addition, the patient has been unable to control her diabetes. Today the patient is found to have acute left temporal parietal stroke, hyperglycemia, urinary tract infection, and elevated BNP. After initiation of fluids, antibiotics, and discussion with our colleagues, the patient was admitted for further evaluation and management.  Gerhard Munch, MD 09/14/15 [phone]

## 2015-09-15 ENCOUNTER — Observation Stay (HOSPITAL_COMMUNITY): Payer: Self-pay

## 2015-09-15 ENCOUNTER — Inpatient Hospital Stay (HOSPITAL_COMMUNITY): Payer: Self-pay

## 2015-09-15 DIAGNOSIS — I639 Cerebral infarction, unspecified: Secondary | ICD-10-CM

## 2015-09-15 DIAGNOSIS — I4891 Unspecified atrial fibrillation: Secondary | ICD-10-CM

## 2015-09-15 DIAGNOSIS — I749 Embolism and thrombosis of unspecified artery: Secondary | ICD-10-CM

## 2015-09-15 DIAGNOSIS — Z8639 Personal history of other endocrine, nutritional and metabolic disease: Secondary | ICD-10-CM

## 2015-09-15 DIAGNOSIS — I10 Essential (primary) hypertension: Secondary | ICD-10-CM

## 2015-09-15 DIAGNOSIS — E079 Disorder of thyroid, unspecified: Secondary | ICD-10-CM

## 2015-09-15 DIAGNOSIS — E118 Type 2 diabetes mellitus with unspecified complications: Secondary | ICD-10-CM

## 2015-09-15 DIAGNOSIS — R4701 Aphasia: Secondary | ICD-10-CM

## 2015-09-15 DIAGNOSIS — E785 Hyperlipidemia, unspecified: Secondary | ICD-10-CM

## 2015-09-15 DIAGNOSIS — Z794 Long term (current) use of insulin: Secondary | ICD-10-CM

## 2015-09-15 DIAGNOSIS — E669 Obesity, unspecified: Secondary | ICD-10-CM

## 2015-09-15 LAB — CBC
HCT: 42.3 % (ref 36.0–46.0)
Hemoglobin: 14.3 g/dL (ref 12.0–15.0)
MCH: 31.5 pg (ref 26.0–34.0)
MCHC: 33.8 g/dL (ref 30.0–36.0)
MCV: 93.2 fL (ref 78.0–100.0)
PLATELETS: 225 10*3/uL (ref 150–400)
RBC: 4.54 MIL/uL (ref 3.87–5.11)
RDW: 12.6 % (ref 11.5–15.5)
WBC: 6.8 10*3/uL (ref 4.0–10.5)

## 2015-09-15 LAB — GLUCOSE, CAPILLARY
GLUCOSE-CAPILLARY: 247 mg/dL — AB (ref 65–99)
GLUCOSE-CAPILLARY: 145 mg/dL — AB (ref 65–99)
Glucose-Capillary: 177 mg/dL — ABNORMAL HIGH (ref 65–99)
Glucose-Capillary: 173 mg/dL — ABNORMAL HIGH (ref 65–99)

## 2015-09-15 LAB — PROTIME-INR
INR: 1.13 (ref 0.00–1.49)
PROTHROMBIN TIME: 14.7 s (ref 11.6–15.2)

## 2015-09-15 LAB — TSH: TSH: 0.95 u[IU]/mL (ref 0.350–4.500)

## 2015-09-15 MED ORDER — RIVAROXABAN 20 MG PO TABS
20.0000 mg | ORAL_TABLET | Freq: Every day | ORAL | Status: DC
  Administered 2015-09-15 – 2015-09-16 (×2): 20 mg via ORAL
  Filled 2015-09-15 (×2): qty 1

## 2015-09-15 MED ORDER — IOHEXOL 350 MG/ML SOLN
50.0000 mL | Freq: Once | INTRAVENOUS | Status: AC | PRN
  Administered 2015-09-15: 50 mL via INTRAVENOUS

## 2015-09-15 MED ORDER — METOPROLOL TARTRATE 25 MG PO TABS
25.0000 mg | ORAL_TABLET | Freq: Two times a day (BID) | ORAL | Status: DC
  Administered 2015-09-15 – 2015-09-16 (×3): 25 mg via ORAL
  Filled 2015-09-15 (×3): qty 1

## 2015-09-15 MED ORDER — WARFARIN SODIUM 7.5 MG PO TABS: 7.5000 mg | ORAL_TABLET | Freq: Once | ORAL | Status: DC

## 2015-09-15 MED ORDER — GLIPIZIDE 5 MG PO TABS
5.0000 mg | ORAL_TABLET | Freq: Every day | ORAL | Status: DC
  Administered 2015-09-16: 5 mg via ORAL
  Filled 2015-09-15: qty 1

## 2015-09-15 MED ORDER — ASPIRIN EC 81 MG PO TBEC: 81.0000 mg | DELAYED_RELEASE_TABLET | Freq: Every day | ORAL | Status: DC

## 2015-09-15 NOTE — Progress Notes (Signed)
STROKE TEAM PROGRESS NOTE   HISTORY Kelly Lang is an 54 y.o. female presenting to hospital after noting yesterday that her vision in her right eye but today she noted that she could not get you words out correctly mainly single words and not every word. But noticeable enough she was concerned. She has no numbness, tingling or weakness. She does feel off balance when walking --tending to leaning to the right. MRI confirmed stroke. She has not been taking her medications since April due to not having insurance.   Date last known well: Unable to determine Time last known well: Unable to determine tPA Given: No: out of window   SUBJECTIVE (INTERVAL HISTORY) No family members present. The patient is back to baseline except for mild word finding problems and mild facial weakness. Dr. Roda Shutters discussed the importance of anticoagulation with known atrial fibrillation. The patient expressed understanding.   OBJECTIVE Temp:  [97.5 F (36.4 C)-99.3 F (37.4 C)] 97.6 F (36.4 C) (10/15 1424) Pulse Rate:  [63-109] 102 (10/15 1424) Cardiac Rhythm:  [-] Atrial fibrillation (10/15 0840) Resp:  [16-18] 18 (10/15 1424) BP: (101-141)/(68-88) 114/69 mmHg (10/15 1424) SpO2:  [96 %-99 %] 99 % (10/15 1424)  CBC:   Recent Labs Lab 09/14/15 1234 09/15/15 0721  WBC 8.2 6.8  HGB 14.7 14.3  HCT 43.4 42.3  MCV 93.9 93.2  PLT 246 225    Basic Metabolic Panel:   Recent Labs Lab 09/14/15 1234  NA 135  K 4.5  CL 103  CO2 24  GLUCOSE 216*  BUN 16  CREATININE 0.65  CALCIUM 9.0    Lipid Panel:     Component Value Date/Time   CHOL 185 09/14/2015 2045   TRIG 185* 09/14/2015 2045   HDL 31* 09/14/2015 2045   CHOLHDL 6.0 09/14/2015 2045   VLDL 37 09/14/2015 2045   LDLCALC 117* 09/14/2015 2045   HgbA1c:  Lab Results  Component Value Date   HGBA1C 8.1* 11/13/2014   Urine Drug Screen: No results found for: LABOPIA, COCAINSCRNUR, LABBENZ, AMPHETMU, THCU, LABBARB    IMAGING I have  personally reviewed the radiological images below and agree with the radiology interpretations.  Dg Chest 2 View 09/14/2015   1. Mild edema and effusions suggesting congestive heart failure.  2. No focal airspace consolidation   Mr Brain Wo Contrast (neuro Protocol) 09/14/2015   Acute infarct left temporoparietal lobe, probably in the embolic infarct given the history of atrial fibrillation. Chronic micro hemorrhage left occipital lobe   CTA of Head and Neck 09/15/2015 IMPRESSION: Acute infarct left temporoparietal lobe as noted on recent MRI. No associated hemorrhage. No significant carotid or vertebral artery stenosis in the neck. Negative for intracranial stenosis or filling defect. The left temporoparietal infarct most likely is embolic in nature. Large complex mass right thyroid measuring 34 x 21 x 15 mm. This is similar to the ultrasound 11/03/2005. Based on the CT findings, this is considered suspicious for malignancy and biopsy is recommended.  2D echo - pending   PHYSICAL EXAM  Temp:  [97.5 F (36.4 C)-99.3 F (37.4 C)] 97.6 F (36.4 C) (10/15 1424) Pulse Rate:  [63-109] 102 (10/15 1424) Resp:  [16-18] 18 (10/15 1424) BP: (101-141)/(68-88) 114/69 mmHg (10/15 1424) SpO2:  [96 %-99 %] 99 % (10/15 1424)  General - morbid obesity, well developed, in no apparent distress.  Ophthalmologic -  Fundi not visualized due to eye movement.   Cardiovascular - irregularly irregular heart rate and rhythm.  Mental Status -  Level of arousal and orientation to time, place, and person were intact. Language including expression, repetition, comprehension was assessed and found intact, however, with occasional paraphasic errors. Naming 2/4. Fund of Knowledge was assessed and was intact.  Cranial Nerves II - XII - II - Visual field intact OU, however, decrease right LQ visual acuity. III, IV, VI - Extraocular movements intact. V - Facial sensation intact bilaterally. VII - right  nasolabial fold flattening. VIII - Hearing & vestibular intact bilaterally. X - Palate elevates symmetrically. XI - Chin turning & shoulder shrug intact bilaterally. XII - Tongue protrusion intact.  Motor Strength - The patient's strength was normal in all extremities and pronator drift was absent.  Bulk was normal and fasciculations were absent.   Motor Tone - Muscle tone was assessed at the neck and appendages and was normal.  Reflexes - The patient's reflexes were 1+ in all extremities and she had no pathological reflexes.  Sensory - Light touch, temperature/pinprick were assessed and were symmetrical.    Coordination - The patient had normal movements in the hands and feet with no ataxia or dysmetria.  Tremor was absent.  Gait and Station - not tested due to fatigue (pt just walked with PT).    ASSESSMENT/PLAN Ms. Kelly Lang is a 54 y.o. female with history of aAdisyn Ruscittillation, diabetes mellitus, obesity, cardiomyopathy, and dyslipidemia, presenting with aphasia and visual disturbance. She did not receive IV t-PA due to late presentation.  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  Resultant  significant improvement in deficits  MRI  Acute infarct left temporoparietal lobe  CTA of head and neck - unremarkable  Carotid Doppler  refer to CTA of the neck  2D Echo pending  LDL - 117  HgbA1c pending  VTE prophylaxis - Xarelto Diet heart healthy/carb modified Room service appropriate?: Yes; Fluid consistency:: Thin  no antithrombotic prior to admission, now on xarelto ( rivaroxaban).   Patient counseled to be compliant with her antithrombotic medications  Ongoing aggressive stroke risk factor management  Therapy recommendations: No follow-up physical therapy recommended.  Disposition: Pending  Afib not on AC  Has been on coumadin but stopped by herself due to insurance issue  On Xarelto  now  Close neuro check  If neuro changes, stat CT head.  Hypertension  Blood pressure tends to run low. On metoprolol 25 mg twice daily for atrial fibrillation rate control.  Permissive hypertension (OK if < 220/120) but gradually normalize in 5-7 days  Hyperlipidemia  Home meds: No lipid lowering medications prior to admission.  LDL 117, goal < 70  Now on Lipitor 10 mg daily  Continue statin at discharge  Diabetes  HgbA1c pending, goal < 7.0  Uncontrolled  Other Stroke Risk Factors  Cigarette smoker, quit smoking 4 years ago.  ETOH use  Obesity, Body mass index is 39.22 kg/(m^2).    Other Active Problems  Thyroid mass - further imaging pending  Recent noncompliance with medications - no medical insurance.  Hospital day # 1  Neurology will sign off. Please call with questions. Pt will follow up with Dr. Roda Shutters at [facility] in about 2 months. Thanks for the consult.  Marvel Plan, MD PhD Stroke Neurology 09/15/2015 4:35 PM    To contact Stroke Continuity provider, please refer to [email or web address]. After hours, contact General Neurology

## 2015-09-15 NOTE — Discharge Planning (Addendum)
Physician Discharge Summary  Ladaja Yusupov WUJ:[id number] DOB: May 01, 1961 DOA: 09/14/2015  PCP: Jeanine Luz, FNP  Admit date: 09/14/2015 Discharge date: 09/16/2015  Time spent: 60 minutes  Recommendations for Outpatient Follow-up:  1. F/u with [facility] 2. F/u with cardiology 3. Bmet in 1wk 4. Hba1c pending   Discharge Condition: stable Diet recommendation: low sodium, low cholesterol, diabetic diet  Discharge Diagnoses:  Principal Problem:   Embolic infarction [facility]) Active Problems:   Expressive aphasia   Atrial fibrillation    Obesity   Type II diabetes mellitus with manifestations (HCC)   Dyslipidemia   Essential hypertension, benign   Low back pain   Thyroid mass   Cardiomyopathy (HCC)   Chronic systolic CHF (congestive heart failure) (HCC)   History of present illness:  This is a 54 year old female with a past medical history of atrial fibrillation who was previously on warfarin and flecainide, diabetes mellitus, hypertension, dyslipidemia and obesity who presents to the hospital with trouble speaking. Per history obtained from her today, she admitted to having some difficulty with vision the night before coming into the hospital. She specifically states that she was having trouble seeing out of her right eye as her vision was very blurry. It continued to be blurry and she went to bed. When she woke up in the morning she noted that she was having trouble getting her words out. At that time she didn't did not notice any visual disturbance. She did not have any other focal neurological symptoms. However, it was noted in the ER that she had some trouble with her gait. She states that she stopped taking all of her medications in May of this year. She last was seen at the [facility] wellness clinic over a year ago. She did follow up with cardiology in regards to her atrial fibrillation in March of this year but as mentioned, has not taking any  medications since May.  Hospital Course:  Acute ischemic infarct -Acute Left temporoparietal seen on MRI-see report below -Most likely embolic due to underlying atrial fibrillation -CTA of head and neck obtained-no significant carotid or vertebral artery stenosis and neck, no intracranial stenosis - speech difficulty has resolved completely and no recurrence of visual disturbance -Lipid profile reveals a cholesterol of 185, triglycerides 185, HDL 31, LDL 117-started statin - see ECHO report below - -Neurology has evaluated the patient and is recommending anticoagulation for A. fib -no need to start aspirin at this time  Atrial fibrillation CHA2DS2-VASc Score 5-(history of systolic heart failure, diabetes, female sex, CVA) -Status post DC failed cardioversion -Previously on flecainide but states she could not afford this-  -In the ER EKG revealed A. fib which was rate controlled at 87 bpm -Has been started on Xarelto-she will obtain a 30 day free coupon from the hospital and subsequently will be able to obtain samples from the [facility] and wellness clinic - started on Metoprolol  - discussed with Cardiology (Dr Girard Cooter) about whether Flecainide needs to be resume- recommended not to resume but rather to have her f/u in office with Dr Melburn Popper  Diabetes mellitus type 2-non-insulin-requiring - -Will resume metformin and glipizide for diabetes as she was taking in the past-hemoglobin A1c is still pending-last hemoglobin A1c from 12/15 was 8.1  Chronic systolic heart failure -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   Hyperlipidemia Started statin- Mevacor is on [facility] drug list- she has no insurance to pay for medications   Hypothyroidism/ thyroid mass  - Complex large right thyroid mass noted on imaging which is was present in 2006-  - d/w IR Dr Faustino Congress thyroid ultrasound- mass is unchanged from 2006, no need for biopsy - Checked Thyroid functions which are normal  UTI? -Asymptomatic-discontinued antibiotics   Procedures: 2-D echo  Left ventricle: Systolic function was difficult to assess in that the endocardial borders were suboptimally visualized, patient was in rapid atrial fibrillation at times, and short cycles were taken when imaged. However, it appears severely reduced, EF 20-25%. Globally hypokinetic. The cavity size was mildly dilated. Wall thickness was normal. The study was not technically sufficient to allow evaluation of LV diastolic dysfunction due to atrial fibrillation. - Aorta: Mild aortic root dilatation. Aortic root dimension: 41 mm (ED). - Mitral valve: There was mild regurgitation. - Left atrium: The atrium was severely dilated. Volume/bsa, ES, (1-plane Simpson&'s, A2C): 46 ml/m^2. - Right ventricle: Systolic function was mildly reduced. - Right atrium: The atrium was mildly dilated. - Tricuspid valve: There was mild regurgitation. - Pulmonary arteries: Systolic pressure was mildly increased. PA peak pressure: 40 mm Hg (S). - Systemic veins: IVC dilated with normal respiratory variation. Estimated CVP 8 mmHg.  Consultations: Neurology   Discharge Exam: Filed Weights   09/14/15 1153  Weight: 106.913 kg (235 lb 11.2 oz)   Filed Vitals:   09/16/15 1427  BP: 100/62  Pulse: 71  Temp: 99.1 F (37.3 C)  Resp: 18     General: AAO x 3, no distress Cardiovascular: RRR, no murmurs  Respiratory: clear to auscultation bilaterally GI: soft, non-tender, non-distended, bowel sound positive  Discharge Instructions You were cared for by a hospitalist during your hospital stay. If you have any questions about your discharge medications or the care you received while you were in the hospital after you are discharged, you can call the unit and asked to speak with the hospitalist on call if the hospitalist that took care of you is not available. Once you are discharged, your primary care physician will handle any further medical issues. Please note that NO REFILLS for any discharge medications will be authorized once you are discharged, as it is imperative that you return to your primary care physician (or establish a relationship with a primary care physician if you do not have one) for your aftercare needs so that they can reassess your need for medications and monitor your lab values.      Discharge Instructions    (HEART FAILURE PATIENTS) Call MD:  Anytime you have any of the following symptoms: 1) 3 pound weight gain in 24 hours or 5 pounds in 1 week 2) shortness of breath, with or without a dry hacking cough 3) swelling in the hands, feet or stomach 4) if you have to sleep on extra pillows at night in order to breathe.    Complete by:  As directed      Ambulatory referral to Neurology    Complete by:  As directed   Pt will follow up with Dr. Roda Shutters at [facility] in about 2 months. Thanks.     Discharge instructions    Complete by:  As directed   Low sodium, heart healthy, diabetic diet Follow up with [facility] (appointment has been scheduled) Cardiology will call you to follow up for a repeat ECHO and office visit     Increase activity slowly    Complete by:  As directed             Medication List    TAKE these medications        acetaminophen 325 MG tablet  Commonly known as:  TYLENOL   Take 2 tablets (650 mg total) by mouth every 6 (six) hours as needed for mild pain or headache.     glipiZIDE 5 MG tablet  Commonly known as:  GLUCOTROL  Take 1 tablet (5 mg total) by mouth daily before breakfast.     lisinopril 10 MG tablet  Commonly known as:  PRINIVIL  Take 0.5 tablets (5 mg total) by mouth daily.     lovastatin 20 MG tablet  Commonly known as:  MEVACOR  Take 1 tablet (20 mg total) by mouth at bedtime.     metFORMIN 500 MG tablet  Commonly known as:  GLUCOPHAGE  Take 1 tablet (500 mg total) by mouth 2 (two) times daily with a meal.  Start taking on:  09/18/2015     metoprolol tartrate 25 MG tablet  Commonly known as:  LOPRESSOR  Take 1 tablet (25 mg total) by mouth 2 (two) times daily.     rivaroxaban 20 MG Tabs tablet  Commonly known as:  XARELTO  Take 1 tablet (20 mg total) by mouth daily with supper.       Allergies  Allergen Reactions  . Codeine Itching and Rash   Follow-up Information    Follow up with [facility]    .   Why:  You have an appointment with Dr. Mardelle Matte at 10:00 on 09/24/15   Contact information:   [address] [place] [address] [phone]      Follow up with Xu,Jindong, MD. Schedule an appointment as soon as possible for a visit in 2 months.   Specialty:  Neurology   Why:  stroke clinic   Contact information:   [address] [place] [address] [phone]       Follow up with Nahser, [name], MD.   Specialty:  Cardiology   Why:  his office will call you but if they do not call in the next 3 days, you should call them and get an appt to be seen in the next 1-2 wks   Contact information:   [address] [place] [address] [phone]        The results of significant diagnostics from this hospitalization (including imaging, microbiology, ancillary and laboratory) are listed below for reference.    Significant  Diagnostic Studies: Ct Angio Head W/cm &/or Wo Cm  09/15/2015  CLINICAL DATA:  Stroke.  Acute infarct left parietal lobe. EXAM: CT ANGIOGRAPHY HEAD AND NECK TECHNIQUE: Multidetector CT imaging of the head and neck was performed using the standard protocol during bolus administration of intravenous contrast. Multiplanar CT image reconstructions and MIPs were obtained to evaluate the vascular anatomy. Carotid stenosis measurements (when applicable) are obtained utilizing NASCET criteria, using the distal internal carotid diameter  as the denominator. CONTRAST:  50mL OMNIPAQUE IOHEXOL 350 MG/ML SOLN COMPARISON:  MRI 09/14/2015 FINDINGS: CT HEAD Brain: Acute infarct in the left temporoparietal lobe as noted on MRI. This is similar in size to the infarct on MRI. No associated hemorrhage. No other infarct. Ventricle size is normal. No shift of the midline structures. Calvarium and skull base: Negative Paranasal sinuses: Negative Orbits: Negative CTA NECK Aortic arch: Normal aortic arch. Proximal great vessels widely patent. Right carotid system: Right carotid artery widely patent without significant stenosis. Negative for dissection. Left carotid system: Left carotid artery widely patent without significant stenosis or dissection. Vertebral arteries:Both vertebral arteries patent to the basilar without significant stenosis or dissection. Skeleton: Negative for acute bony abnormality Other neck: Large mass in the right thyroid measuring 34 x 21 x 50 mm. This has heterogeneous density with peripheral calcification. Similar to the ultrasound of 11/03/2005. Based on the CT scan, this is concerning for malignancy and biopsy would be recommended. It is reassuring that this lesion appears stable based on comparison with prior ultrasound. CTA HEAD Anterior circulation: Cavernous carotid shows mild atherosclerotic disease without significant stenosis. Anterior and middle cerebral arteries are patent. There is slight  hypoperfusion in the area of acute infarct. No large vessel occlusion or filling defect identified. Negative for aneurysm in the anterior circulation Posterior circulation: Both vertebral arteries are patent to the basilar. Right PICA patent. Left PICA not visualized. Basilar widely patent. Superior cerebellar and posterior cerebral arteries widely patent without stenosis or aneurysm. Venous sinuses: Patent Anatomic variants: None Delayed phase: Normal enhancement on delayed imaging. The acute infarct does not enhance at this point. IMPRESSION: Acute infarct left temporoparietal lobe as noted on recent MRI. No associated hemorrhage. No significant carotid or vertebral artery stenosis in the neck. Negative for intracranial stenosis or filling defect. The left temporoparietal infarct most likely is embolic in nature. Large complex mass right thyroid measuring 34 x 21 x 15 mm. This is similar to the ultrasound 11/03/2005. Based on the CT findings, this is considered suspicious for malignancy and biopsy is recommended. Electronically Signed   By: Marlan Palau M.D.   On: 09/15/2015 10:37   Dg Chest 2 View  09/14/2015  CLINICAL DATA:  Atrial fibrillation.  Hyperglycemia. EXAM: CHEST - 2 VIEW COMPARISON:  One-view chest 10/25/2013. FINDINGS: The heart size is exaggerated by low lung volumes. Mild interstitial edema is present. Small effusions are noted. No significant airspace consolidation is present. The visualized soft tissues and bony thorax are unremarkable. IMPRESSION: 1. Mild edema and effusions suggesting congestive heart failure. 2. No focal airspace consolidation Electronically Signed   By: Marin Roberts M.D.   On: 09/14/2015 12:53   Ct Angio Neck W/cm &/or Wo/cm  09/15/2015  CLINICAL DATA:  Stroke.  Acute infarct left parietal lobe. EXAM: CT ANGIOGRAPHY HEAD AND NECK TECHNIQUE: Multidetector CT imaging of the head and neck was performed using the standard protocol during bolus administration of  intravenous contrast. Multiplanar CT image reconstructions and MIPs were obtained to evaluate the vascular anatomy. Carotid stenosis measurements (when applicable) are obtained utilizing NASCET criteria, using the distal internal carotid diameter as the denominator. CONTRAST:  50mL OMNIPAQUE IOHEXOL 350 MG/ML SOLN COMPARISON:  MRI 09/14/2015 FINDINGS: CT HEAD Brain: Acute infarct in the left temporoparietal lobe as noted on MRI. This is similar in size to the infarct on MRI. No associated hemorrhage. No other infarct. Ventricle size is normal. No shift of the midline structures. Calvarium and skull base: Negative Paranasal sinuses:  Negative Orbits: Negative CTA NECK Aortic arch: Normal aortic arch. Proximal great vessels widely patent. Right carotid system: Right carotid artery widely patent without significant stenosis. Negative for dissection. Left carotid system: Left carotid artery widely patent without significant stenosis or dissection. Vertebral arteries:Both vertebral arteries patent to the basilar without significant stenosis or dissection. Skeleton: Negative for acute bony abnormality Other neck: Large mass in the right thyroid measuring 34 x 21 x 50 mm. This has heterogeneous density with peripheral calcification. Similar to the ultrasound of 11/03/2005. Based on the CT scan, this is concerning for malignancy and biopsy would be recommended. It is reassuring that this lesion appears stable based on comparison with prior ultrasound. CTA HEAD Anterior circulation: Cavernous carotid shows mild atherosclerotic disease without significant stenosis. Anterior and middle cerebral arteries are patent. There is slight hypoperfusion in the area of acute infarct. No large vessel occlusion or filling defect identified. Negative for aneurysm in the anterior circulation Posterior circulation: Both vertebral arteries are patent to the basilar. Right PICA patent. Left PICA not visualized. Basilar widely patent. Superior  cerebellar and posterior cerebral arteries widely patent without stenosis or aneurysm. Venous sinuses: Patent Anatomic variants: None Delayed phase: Normal enhancement on delayed imaging. The acute infarct does not enhance at this point. IMPRESSION: Acute infarct left temporoparietal lobe as noted on recent MRI. No associated hemorrhage. No significant carotid or vertebral artery stenosis in the neck. Negative for intracranial stenosis or filling defect. The left temporoparietal infarct most likely is embolic in nature. Large complex mass right thyroid measuring 34 x 21 x 15 mm. This is similar to the ultrasound 11/03/2005. Based on the CT findings, this is considered suspicious for malignancy and biopsy is recommended. Electronically Signed   By: Marlan Palau M.D.   On: 09/15/2015 10:37   Mr Brain Wo Contrast (neuro Protocol)  09/14/2015  CLINICAL DATA:  Expressive aphasia. Atrial fibrillation, diabetes, dyslipidemia, obesity. Headache and dizziness and visual disturbance. EXAM: MRI HEAD WITHOUT CONTRAST TECHNIQUE: Multiplanar, multiecho pulse sequences of the brain and surrounding structures were obtained without intravenous contrast. COMPARISON:  None. FINDINGS: Acute infarct in the left temporoparietal lobe measuring approximately 3 x 4 cm. This is likely embolic given the history. No other areas of acute or chronic infarction. Focal micro hemorrhage in the left occipital lobe, separate from the acute infarct. This may be due to a prior embolus or hypertensive bleed. Basal ganglia and brainstem normal. Cerebellum normal. Ventricle size normal. Negative for mass or edema.  No shift of the midline structures. Paranasal sinuses are clear. IMPRESSION: Acute infarct left temporoparietal lobe, probably in the embolic infarct given the history of atrial fibrillation. Chronic micro hemorrhage left occipital lobe Electronically Signed   By: Marlan Palau M.D.   On: 09/14/2015 16:19   US Soft Tissue  Head/neck  09/16/2015  CLINICAL DATA:  Thyroid nodule noted by CT EXAM: THYROID ULTRASOUND TECHNIQUE: Ultrasound examination of the thyroid gland and adjacent soft tissues was performed. COMPARISON:  11/03/2005 FINDINGS: Right thyroid lobe Measurements: 5.7 x 3.2 x 3.6 cm. 4.3 x 2.7 x 3.2 complex and predominately solid mass in the right mid lobe with central calcifications previously measured up to 6.0 cm and is therefore smaller. Left thyroid lobe Measurements: 3.4 x 1.5 x 1.2 cm. Multiple small nodules are present. The largest is in the mid lobe measuring 8 mm. Isthmus Thickness: 5 mm.  No nodules visualized. Lymphadenopathy None visualized. IMPRESSION: Bilateral nodules as described. The dominant nodule in the right lobe today measures  4.3 cm and previously measured up to 6.0 cm. This supports benign etiology. Electronically Signed   By: Jolaine Click M.D.   On: 09/16/2015 08:24    Microbiology: Recent Results (from the past 240 hour(s))  Culture, Urine     Status: None (Preliminary result)   Collection Time: 09/14/15  1:45 PM  Result Value Ref Range Status   Specimen Description URINE, CLEAN CATCH  Final   Special Requests NONE  Final   Culture >=100,000 COLONIES/mL ESCHERICHIA COLI  Final   Report Status PENDING  Incomplete     Labs: Basic Metabolic Panel:  Recent Labs Lab 09/14/15 1234  NA 135  K 4.5  CL 103  CO2 24  GLUCOSE 216*  BUN 16  CREATININE 0.65  CALCIUM 9.0   Liver Function Tests: No results for input(s): AST, ALT, ALKPHOS, BILITOT, PROT, ALBUMIN in the last 168 hours. No results for input(s): LIPASE, AMYLASE in the last 168 hours. No results for input(s): AMMONIA in the last 168 hours. CBC:  Recent Labs Lab 09/14/15 1234 09/15/15 0721 09/16/15 0440  WBC 8.2 6.8 8.0  HGB 14.7 14.3 14.4  HCT 43.4 42.3 42.6  MCV 93.9 93.2 93.8  PLT 246 225 225   Cardiac Enzymes: No results for input(s): CKTOTAL, CKMB, CKMBINDEX, TROPONINI in the last 168  hours. BNP: BNP (last 3 results)  Recent Labs  09/14/15 1524  BNP 323.7*    ProBNP (last 3 results) No results for input(s): PROBNP in the last 8760 hours.  CBG:  Recent Labs Lab 09/15/15 1211 09/15/15 1658 09/15/15 2147 09/16/15 0618 09/16/15 1125  GLUCAP 247* 173* 145* 176* 156*       SignedCalvert Cantor, MD [facility] 09/16/2015, 4:05 PM

## 2015-09-15 NOTE — Progress Notes (Signed)
ANTICOAGULATION CONSULT NOTE Pharmacy Consult for warfarin Indication: atrial fibrillation  Allergies  Allergen Reactions  . Codeine Itching and Rash    Patient Measurements: Height: 5\' 5"  (165.1 cm) Weight: 235 lb 11.2 oz (106.913 kg) IBW/kg (Calculated) : 57   Vital Signs: Temp: 99.3 F (37.4 C) (10/15 1053) Temp Source: Oral (10/15 1053) BP: 112/77 mmHg (10/15 1053) Pulse Rate: 109 (10/15 1053)  Labs:  Recent Labs  09/14/15 1234 09/15/15 0721  HGB 14.7 14.3  HCT 43.4 42.3  PLT 246 225  LABPROT  --  14.7  INR  --  1.13  CREATININE 0.65  --     Estimated Creatinine Clearance: 100 mL/min (by C-G formula based on Cr of 0.65).   Assessment: 26 YOF who presented to the hospital after noting she was having vision issues yesterday, and then she felt as though she could not get words out correctly today. She was outside tPA window, and therefore this was not given. She has a history of AFib, but is not currently on medications at home as she lost her insurance in April and hasn't been taking any prescriptions since that time.  In March, her warfarin dosing was 7.5mg  daily except 5mg  on MWF. Lovenox 30mg  has been ordered for VTE prophylaxis. This can be continued until INR is therapeutic. INR = 1.13  Goal of Therapy:  INR 2-3 Monitor platelets by anticoagulation protocol: Yes   Plan:  -repeat warfarin 7.5mg  po x1 tonight -daily CBC and INR -follow for s/s bleeding -f/u for any re-education needs  Thank you Okey Regal, PharmD [phone] 09/15/2015 2:09 PM

## 2015-09-15 NOTE — Progress Notes (Signed)
Pt back to room from procedure. P. Amo Kuffour RN 

## 2015-09-15 NOTE — Progress Notes (Signed)
Pt transported off unit to CT for procedure. P. Amo Ambyr Qadri RN 

## 2015-09-15 NOTE — Evaluation (Signed)
Physical Therapy Evaluation Patient Details Name: Kelly Lang MRN: [id number] DOB: 1961/02/04 Today's Date: 09/15/2015   History of Present Illness  Kelly Lang is a 54 y.o. female with a past medical history that includes A. fib, diabetes, obesity, hypertension, dyslipidemia presents to emergency department with the chief complaint of difficulty speaking. 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  Clinical Impression  Pt functioning near baseline. Pt demo's R eye peripheral vision deficits and mild word finding difficulties however she becomes emotional over this. Pt at minimal falls risk as indicated by score of 21 on DGI. Acute PT to con't to follow to progress higher level balance for safe return to work.    Follow Up Recommendations No PT follow up;Supervision - Intermittent    Equipment Recommendations  None recommended by PT    Recommendations for Other Services       Precautions / Restrictions Precautions Precautions: Fall Restrictions Weight Bearing Restrictions: No      Mobility  Bed Mobility Overal bed mobility: Modified Independent             General bed mobility comments: hob elevated  Transfers Overall transfer level: Needs assistance Equipment used: None Transfers: Sit to/from Stand Sit to Stand: Min guard         General transfer comment: good technique  Ambulation/Gait Ambulation/Gait assistance: Min guard Ambulation Distance (Feet): 200 Feet Assistive device: None Gait Pattern/deviations: Step-through pattern Gait velocity: baseline for patient Gait velocity interpretation: at or above normal speed for age/gender General Gait Details: pt wit no episodes of LOB however guarded due to R eye vision deficits  Stairs Stairs: Yes Stairs assistance: Min guard Stair Management: One rail Left Number of Stairs: 13 General stair comments: acsend reciprocally and descend step to  Wheelchair Mobility    Modified  Rankin (Stroke Patients Only) Modified Rankin (Stroke Patients Only) Pre-Morbid Rankin Score: No symptoms Modified Rankin: Slight disability     Balance                                 Standardized Balance Assessment Standardized Balance Assessment : Dynamic Gait Index   Dynamic Gait Index Level Surface: Normal Change in Gait Speed: Mild Impairment Gait with Horizontal Head Turns: Mild Impairment Gait with Vertical Head Turns: Normal Gait and Pivot Turn: Normal Step Over Obstacle: Normal Step Around Obstacles: Normal Steps: Mild Impairment Total Score: 21       Pertinent Vitals/Pain Pain Assessment: No/denies pain    Home Living Family/patient expects to be discharged to:: Private residence Living Arrangements: Other relatives Available Help at Discharge: Friend(s);Available 24 hours/day Type of Home: House Home Access: Stairs to enter Entrance Stairs-Rails: Left Entrance Stairs-Number of Steps: 2 Home Layout: Able to live on main level with bedroom/bathroom;Two level Home Equipment: None      Prior Function Level of Independence: Independent         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     Hand Dominance   Dominant Hand: Right    Extremity/Trunk Assessment   Upper Extremity Assessment: Overall WFL for tasks assessed           Lower Extremity Assessment: Overall WFL for tasks assessed      Cervical / Trunk Assessment: Normal  Communication   Communication: Expressive difficulties (mild word finding difficulties)  Cognition Arousal/Alertness: Awake/alert Behavior During Therapy: WFL for tasks assessed/performed (however emotional) Overall Cognitive Status: Within Functional Limits for tasks  assessed                      General Comments General comments (skin integrity, edema, etc.): pt emotional over word finding difficulties. Pt with noted R peripheral vision deficits. has to turn head to find items on R side.  ie. "how many fingers am I holding up"    Exercises        Assessment/Plan    PT Assessment Patient needs continued PT services  PT Diagnosis Generalized weakness   PT Problem List Decreased balance;Decreased mobility  PT Treatment Interventions DME instruction;Gait training;Stair training;Functional mobility training;Therapeutic activities;Therapeutic exercise;Balance training;Neuromuscular re-education   PT Goals (Current goals can be found in the Care Plan section) Acute Rehab PT Goals Patient Stated Goal: home PT Goal Formulation: With patient Time For Goal Achievement: 09/22/15 Potential to Achieve Goals: Good Additional Goals Additional Goal #1: Pt to score > 49 on Berg to indicate minimal falls risk.    Frequency Min 3X/week   Barriers to discharge        Co-evaluation               End of Session Equipment Utilized During Treatment: Gait belt Activity Tolerance: Patient tolerated treatment well Patient left: in chair;with call bell/phone within reach;with chair alarm set Nurse Communication: Mobility status    Functional Assessment Tool Used: clinical judgement Functional Limitation: Mobility: Walking and moving around Mobility: Walking and Moving Around Current Status (W0981): At least 1 percent but less than 20 percent impaired, limited or restricted Mobility: Walking and Moving Around Goal Status [phone]): At least 1 percent but less than 20 percent impaired, limited or restricted    Time: 1141-1205 PT Time Calculation (min) (ACUTE ONLY): 24 min   Charges:   PT Evaluation $Initial PT Evaluation Tier I: 1 Procedure PT Treatments $Gait Training: 8-22 mins   PT G Codes:   PT G-Codes **NOT FOR INPATIENT CLASS** Functional Assessment Tool Used: clinical judgement Functional Limitation: Mobility: Walking and moving around Mobility: Walking and Moving Around Current Status (W2956): At least 1 percent but less than 20 percent impaired, limited or  restricted Mobility: Walking and Moving Around Goal Status [phone]): At least 1 percent but less than 20 percent impaired, limited or restricted    Marcene Brawn 09/15/2015, 12:57 PM  Lewis Shock, PT, DPT Pager #: [phone] Office #: [phone]

## 2015-09-15 NOTE — Progress Notes (Signed)
OT Cancellation Note  Patient Details Name: Kelly Lang MRN: [id number] DOB: [birth year]   Cancelled Treatment:    Reason Eval/Treat Not Completed: Patient at procedure or test/ unavailable will recheck on pt as schedule allows  Joyel Chenette, [place]ung  09/15/2015  Lise Auer, OT [phone]  09/15/2015, 10:38 AM

## 2015-09-15 NOTE — Progress Notes (Addendum)
[facility] Progress Note   Kelly Lang  PYY:[id number]  DOB: 12-Dec-1960  DOA: 09/14/2015 PCP: Jeanine Luz, FNP  Brief narrative:  This is a 54 year old female with a past medical history of atrial fibrillation who was previously on warfarin and flecainide, diabetes mellitus, hypertension, dyslipidemia and obesity who presents to the hospital with trouble speaking. Per history obtained from her today, she admitted to having some difficulty with vision the night before coming into the hospital. She specifically states that she was having trouble seeing out of her right eye as her vision was very blurry. It continued to be blurry and she went to bed. When she woke up in the morning she noted that she was having trouble getting her words out. At that time she didn't did not notice any visual disturbance. She did not have any other focal neurological symptoms. However, it was noted in the ER that she had some trouble with her gait. She states that she stopped taking all of her medications in May of this year. She last was seen at the [facility] and wellness clinic over a year ago. She did follow up with cardiology in regards to her atrial fibrillation in March of this year but as mentioned, has not taking any medication since May.   Subjective: No complaints. Speech back to normal - no recurrece of visual disturbance. No new focal weakness or tingling. No palpitations, chest pain or dyspnea.   Assessment/Plan:  Acute ischemic infarct -Acute Left temporoparietal seen on MRI-see report below -Most likely embolic due to underlying atrial fibrillation -CTA of head and neck obtained-no significant carotid or vertebral artery stenosis and neck, no intracranial stenosis - speech difficulty has resolved completely and no recurrence of visual disturbance -Lipid profile reveals a cholesterol of 185, triglycerides 185, HDL 31, LDL 117-started statin - -Neurology has evaluated the patient and  is recommending anticoagulation for A. fib -no need to start aspirin at this time - will start Xarelto- see below  Atrial fibrillation - CHA2DS2-VASc Score 5-(history of systolic heart failure, diabetes, female sex, CVA) -Status post unsuccessful d/c cardioversion -Previously on flecainide but states she could not afford this-  -In the ER EKG revealed A. fib which was rate controlled at 87 bpm -Has been started on Xarelto-she will obtain a 30 day free coupon from the hospital and subsequently will be able to obtain samples from the [facility] and wellness clinic - HR has been in low 100s today at rest- started on Metoprolol with improvement in rate to 70-90 range - I discussed with Cardiology (Dr Elberta Fortis) about whether Flecainide needs to be resumed - recommended not to resume but rather f/u in office with Dr Elease Hashimoto  Diabetes mellitus type 2-non-insulin-requiring - -Will resume metformin and glipizide for diabetes as she was taking in the past-hemoglobin A1c is still pending-last hemoglobin A1c from 12/15 was 8.1  Chronic systolic heart failure -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 -2-D echo from 2014 revealed an EF of 40-45%-diastolic function could not be determined - current ECHO reveals a significant drop in EF to 20-25 % with global hypokinesis - will start low dose Lisinopril- control HR to prevent further deterioration in function - have spoken with cardiology in regards to ECHO today (Dr Purvis Sheffield)- he will schedule and ECHO with contrast as outpt and arrange a follow up appt for her as well  Hyperlipidemia Started Mevacor which is on the [facility] drug list (as she has no insurance)-  Hypothyroidism/ thyroid mass  -  Complex large right thyroid mass noted on imaging which is was present in 2006 - d/w IR Dr Bonnielee Haff- will need to obtain thyroid ultrasound- if mass is unchanged from 2006, no need for biopsy - Check Thyroid  functions  UTI? -Asymptomatic-discontinue antibiotics     Code Status:     Code Status Orders        Start     Ordered   09/14/15 1949  Full code   Continuous     09/14/15 1948     Family Communication:  Disposition Plan: home tomorrow DVT prophylaxis: Xarelto Consultants:Neurology Procedures: Left ventricle: Systolic function was difficult to assess in that the endocardial borders were suboptimally visualized, patient was in rapid atrial fibrillation at times, and short cycles were taken when imaged. However, it appears severely reduced, EF 20-25%. Globally hypokinetic. The cavity size was mildly dilated. Wall thickness was normal. The study was not technically sufficient to allow evaluation of LV diastolic dysfunction due to atrial fibrillation. - Aorta: Mild aortic root dilatation. Aortic root dimension: 41 mm (ED). - Mitral valve: There was mild regurgitation. - Left atrium: The atrium was severely dilated. Volume/bsa, ES, (1-plane Simpson&'s, A2C): 46 ml/m^2. - Right ventricle: Systolic function was mildly reduced. - Right atrium: The atrium was mildly dilated. - Tricuspid valve: There was mild regurgitation. - Pulmonary arteries: Systolic pressure was mildly increased. PA peak pressure: 40 mm Hg (S). - Systemic veins: IVC dilated with normal respiratory variation. Estimated CVP 8 mmHg.  Antibiotics: Anti-infectives    Start     Dose/Rate Route Frequency Ordered Stop   09/15/15 1800  cefTRIAXone (ROCEPHIN) 1 g in dextrose 5 % 50 mL IVPB  Status:  Discontinued     1 g 100 mL/hr over 30 Minutes Intravenous Every 24 hours 09/14/15 1949 09/15/15 1427   09/14/15 1630  cefTRIAXone (ROCEPHIN) 1 g in dextrose 5 % 50 mL IVPB     1 g 100 mL/hr over 30 Minutes Intravenous  Once 09/14/15 1616 09/14/15 1836      Objective: Filed Weights   09/14/15 1153  Weight: 106.913 kg (235 lb 11.2 oz)   No intake or output data in the 24 hours ending  09/16/15 1554   Vitals Filed Vitals:   09/16/15 0144 09/16/15 0519 09/16/15 0923 09/16/15 1427  BP: 124/78 127/74 110/75 100/62  Pulse: 92 85 99 71  Temp: 98.9 F (37.2 C) 98.9 F (37.2 C) 98 F (36.7 C) 99.1 F (37.3 C)  TempSrc: Oral Oral Oral Oral  Resp: 18 19 18 18   Height:      Weight:      SpO2: 99% 95% 93% 98%    Exam:  General:  Pt is alert, not in acute distress  HEENT: No icterus, No thrush, oral mucosa moist  Cardiovascular: IIRR, S1/S2 No murmur  Respiratory: clear to auscultation bilaterally   Abdomen: Soft, +Bowel sounds, non tender, non distended, no guarding  MSK: No LE edema, cyanosis or clubbing  Data Reviewed: Basic Metabolic Panel:  Recent Labs Lab 09/14/15 1234  NA 135  K 4.5  CL 103  CO2 24  GLUCOSE 216*  BUN 16  CREATININE 0.65  CALCIUM 9.0   Liver Function Tests: No results for input(s): AST, ALT, ALKPHOS, BILITOT, PROT, ALBUMIN in the last 168 hours. No results for input(s): LIPASE, AMYLASE in the last 168 hours. No results for input(s): AMMONIA in the last 168 hours. CBC:  Recent Labs Lab 09/14/15 1234 09/15/15 0721 09/16/15 0440  WBC 8.2 6.8  8.0  HGB 14.7 14.3 14.4  HCT 43.4 42.3 42.6  MCV 93.9 93.2 93.8  PLT 246 225 225   Cardiac Enzymes: No results for input(s): CKTOTAL, CKMB, CKMBINDEX, TROPONINI in the last 168 hours. BNP (last 3 results)  Recent Labs  09/14/15 1524  BNP 323.7*    ProBNP (last 3 results) No results for input(s): PROBNP in the last 8760 hours.  CBG:  Recent Labs Lab 09/15/15 1211 09/15/15 1658 09/15/15 2147 09/16/15 0618 09/16/15 1125  GLUCAP 247* 173* 145* 176* 156*    Recent Results (from the past 240 hour(s))  Culture, Urine     Status: None (Preliminary result)   Collection Time: 09/14/15  1:45 PM  Result Value Ref Range Status   Specimen Description URINE, CLEAN CATCH  Final   Special Requests NONE  Final   Culture >=100,000 COLONIES/mL ESCHERICHIA COLI  Final   Report  Status PENDING  Incomplete     Studies: Ct Angio Head W/cm &/or Wo Cm  09/15/2015  CLINICAL DATA:  Stroke.  Acute infarct left parietal lobe. EXAM: CT ANGIOGRAPHY HEAD AND NECK TECHNIQUE: Multidetector CT imaging of the head and neck was performed using the standard protocol during bolus administration of intravenous contrast. Multiplanar CT image reconstructions and MIPs were obtained to evaluate the vascular anatomy. Carotid stenosis measurements (when applicable) are obtained utilizing NASCET criteria, using the distal internal carotid diameter as the denominator. CONTRAST:  50mL OMNIPAQUE IOHEXOL 350 MG/ML SOLN COMPARISON:  MRI 09/14/2015 FINDINGS: CT HEAD Brain: Acute infarct in the left temporoparietal lobe as noted on MRI. This is similar in size to the infarct on MRI. No associated hemorrhage. No other infarct. Ventricle size is normal. No shift of the midline structures. Calvarium and skull base: Negative Paranasal sinuses: Negative Orbits: Negative CTA NECK Aortic arch: Normal aortic arch. Proximal great vessels widely patent. Right carotid system: Right carotid artery widely patent without significant stenosis. Negative for dissection. Left carotid system: Left carotid artery widely patent without significant stenosis or dissection. Vertebral arteries:Both vertebral arteries patent to the basilar without significant stenosis or dissection. Skeleton: Negative for acute bony abnormality Other neck: Large mass in the right thyroid measuring 34 x 21 x 50 mm. This has heterogeneous density with peripheral calcification. Similar to the ultrasound of 11/03/2005. Based on the CT scan, this is concerning for malignancy and biopsy would be recommended. It is reassuring that this lesion appears stable based on comparison with prior ultrasound. CTA HEAD Anterior circulation: Cavernous carotid shows mild atherosclerotic disease without significant stenosis. Anterior and middle cerebral arteries are patent. There  is slight hypoperfusion in the area of acute infarct. No large vessel occlusion or filling defect identified. Negative for aneurysm in the anterior circulation Posterior circulation: Both vertebral arteries are patent to the basilar. Right PICA patent. Left PICA not visualized. Basilar widely patent. Superior cerebellar and posterior cerebral arteries widely patent without stenosis or aneurysm. Venous sinuses: Patent Anatomic variants: None Delayed phase: Normal enhancement on delayed imaging. The acute infarct does not enhance at this point. IMPRESSION: Acute infarct left temporoparietal lobe as noted on recent MRI. No associated hemorrhage. No significant carotid or vertebral artery stenosis in the neck. Negative for intracranial stenosis or filling defect. The left temporoparietal infarct most likely is embolic in nature. Large complex mass right thyroid measuring 34 x 21 x 15 mm. This is similar to the ultrasound 11/03/2005. Based on the CT findings, this is considered suspicious for malignancy and biopsy is recommended. Electronically Signed  By: Marlan Palau M.D.   On: 09/15/2015 10:37   Ct Angio Neck W/cm &/or Wo/cm  09/15/2015  CLINICAL DATA:  Stroke.  Acute infarct left parietal lobe. EXAM: CT ANGIOGRAPHY HEAD AND NECK TECHNIQUE: Multidetector CT imaging of the head and neck was performed using the standard protocol during bolus administration of intravenous contrast. Multiplanar CT image reconstructions and MIPs were obtained to evaluate the vascular anatomy. Carotid stenosis measurements (when applicable) are obtained utilizing NASCET criteria, using the distal internal carotid diameter as the denominator. CONTRAST:  50mL OMNIPAQUE IOHEXOL 350 MG/ML SOLN COMPARISON:  MRI 09/14/2015 FINDINGS: CT HEAD Brain: Acute infarct in the left temporoparietal lobe as noted on MRI. This is similar in size to the infarct on MRI. No associated hemorrhage. No other infarct. Ventricle size is normal. No shift of  the midline structures. Calvarium and skull base: Negative Paranasal sinuses: Negative Orbits: Negative CTA NECK Aortic arch: Normal aortic arch. Proximal great vessels widely patent. Right carotid system: Right carotid artery widely patent without significant stenosis. Negative for dissection. Left carotid system: Left carotid artery widely patent without significant stenosis or dissection. Vertebral arteries:Both vertebral arteries patent to the basilar without significant stenosis or dissection. Skeleton: Negative for acute bony abnormality Other neck: Large mass in the right thyroid measuring 34 x 21 x 50 mm. This has heterogeneous density with peripheral calcification. Similar to the ultrasound of 11/03/2005. Based on the CT scan, this is concerning for malignancy and biopsy would be recommended. It is reassuring that this lesion appears stable based on comparison with prior ultrasound. CTA HEAD Anterior circulation: Cavernous carotid shows mild atherosclerotic disease without significant stenosis. Anterior and middle cerebral arteries are patent. There is slight hypoperfusion in the area of acute infarct. No large vessel occlusion or filling defect identified. Negative for aneurysm in the anterior circulation Posterior circulation: Both vertebral arteries are patent to the basilar. Right PICA patent. Left PICA not visualized. Basilar widely patent. Superior cerebellar and posterior cerebral arteries widely patent without stenosis or aneurysm. Venous sinuses: Patent Anatomic variants: None Delayed phase: Normal enhancement on delayed imaging. The acute infarct does not enhance at this point. IMPRESSION: Acute infarct left temporoparietal lobe as noted on recent MRI. No associated hemorrhage. No significant carotid or vertebral artery stenosis in the neck. Negative for intracranial stenosis or filling defect. The left temporoparietal infarct most likely is embolic in nature. Large complex mass right thyroid  measuring 34 x 21 x 15 mm. This is similar to the ultrasound 11/03/2005. Based on the CT findings, this is considered suspicious for malignancy and biopsy is recommended. Electronically Signed   By: Marlan Palau M.D.   On: 09/15/2015 10:37   Mr Brain Wo Contrast (neuro Protocol)  09/14/2015  CLINICAL DATA:  Expressive aphasia. Atrial fibrillation, diabetes, dyslipidemia, obesity. Headache and dizziness and visual disturbance. EXAM: MRI HEAD WITHOUT CONTRAST TECHNIQUE: Multiplanar, multiecho pulse sequences of the brain and surrounding structures were obtained without intravenous contrast. COMPARISON:  None. FINDINGS: Acute infarct in the left temporoparietal lobe measuring approximately 3 x 4 cm. This is likely embolic given the history. No other areas of acute or chronic infarction. Focal micro hemorrhage in the left occipital lobe, separate from the acute infarct. This may be due to a prior embolus or hypertensive bleed. Basal ganglia and brainstem normal. Cerebellum normal. Ventricle size normal. Negative for mass or edema.  No shift of the midline structures. Paranasal sinuses are clear. IMPRESSION: Acute infarct left temporoparietal lobe, probably in the embolic infarct given  the history of atrial fibrillation. Chronic micro hemorrhage left occipital lobe Electronically Signed   By: Marlan Palau M.D.   On: 09/14/2015 16:19   US Soft Tissue Head/neck  09/16/2015  CLINICAL DATA:  Thyroid nodule noted by CT EXAM: THYROID ULTRASOUND TECHNIQUE: Ultrasound examination of the thyroid gland and adjacent soft tissues was performed. COMPARISON:  11/03/2005 FINDINGS: Right thyroid lobe Measurements: 5.7 x 3.2 x 3.6 cm. 4.3 x 2.7 x 3.2 complex and predominately solid mass in the right mid lobe with central calcifications previously measured up to 6.0 cm and is therefore smaller. Left thyroid lobe Measurements: 3.4 x 1.5 x 1.2 cm. Multiple small nodules are present. The largest is in the mid lobe measuring 8 mm.  Isthmus Thickness: 5 mm.  No nodules visualized. Lymphadenopathy None visualized. IMPRESSION: Bilateral nodules as described. The dominant nodule in the right lobe today measures 4.3 cm and previously measured up to 6.0 cm. This supports benign etiology. Electronically Signed   By: Jolaine Click M.D.   On: 09/16/2015 08:24    Scheduled Meds:  Scheduled Meds: . atorvastatin  10 mg Oral q1800  . glipiZIDE  5 mg Oral QAC breakfast  . insulin aspart  0-15 Units Subcutaneous TID WC  . insulin aspart  0-5 Units Subcutaneous QHS  . metoprolol tartrate  25 mg Oral BID  . rivaroxaban  20 mg Oral Q supper   Continuous Infusions:   Time spent on care of this patient: 35 min   Seletha Zimmermann, MD 09/16/2015, 3:54 PM  LOS: 2 days   [facility] Office  [phone] Pager - Text Page per [email or web address] If 7PM-7AM, please contact night-coverage [email or web address]

## 2015-09-16 ENCOUNTER — Inpatient Hospital Stay (HOSPITAL_COMMUNITY): Payer: Self-pay

## 2015-09-16 ENCOUNTER — Other Ambulatory Visit: Payer: Self-pay | Admitting: Physician Assistant

## 2015-09-16 DIAGNOSIS — I5023 Acute on chronic systolic (congestive) heart failure: Secondary | ICD-10-CM

## 2015-09-16 DIAGNOSIS — I429 Cardiomyopathy, unspecified: Secondary | ICD-10-CM

## 2015-09-16 DIAGNOSIS — I63412 Cerebral infarction due to embolism of left middle cerebral artery: Secondary | ICD-10-CM | POA: Insufficient documentation

## 2015-09-16 DIAGNOSIS — I6350 Cerebral infarction due to unspecified occlusion or stenosis of unspecified cerebral artery: Secondary | ICD-10-CM

## 2015-09-16 DIAGNOSIS — I5022 Chronic systolic (congestive) heart failure: Secondary | ICD-10-CM

## 2015-09-16 DIAGNOSIS — I519 Heart disease, unspecified: Secondary | ICD-10-CM

## 2015-09-16 LAB — GLUCOSE, CAPILLARY
GLUCOSE-CAPILLARY: 156 mg/dL — AB (ref 65–99)
Glucose-Capillary: 176 mg/dL — ABNORMAL HIGH (ref 65–99)

## 2015-09-16 LAB — CBC
HEMATOCRIT: 42.6 % (ref 36.0–46.0)
HEMOGLOBIN: 14.4 g/dL (ref 12.0–15.0)
MCH: 31.7 pg (ref 26.0–34.0)
MCHC: 33.8 g/dL (ref 30.0–36.0)
MCV: 93.8 fL (ref 78.0–100.0)
Platelets: 225 10*3/uL (ref 150–400)
RBC: 4.54 MIL/uL (ref 3.87–5.11)
RDW: 12.7 % (ref 11.5–15.5)
WBC: 8 10*3/uL (ref 4.0–10.5)

## 2015-09-16 LAB — PROTIME-INR
INR: 1.53 — AB (ref 0.00–1.49)
Prothrombin Time: 18.4 seconds — ABNORMAL HIGH (ref 11.6–15.2)

## 2015-09-16 LAB — T3, FREE: T3, Free: 2.8 pg/mL (ref 2.0–4.4)

## 2015-09-16 MED ORDER — GLIPIZIDE 5 MG PO TABS: 5.0000 mg | ORAL_TABLET | Freq: Every day | ORAL | Status: DC

## 2015-09-16 MED ORDER — LISINOPRIL 10 MG PO TABS: 5.0000 mg | ORAL_TABLET | Freq: Every day | ORAL | Status: DC

## 2015-09-16 MED ORDER — METFORMIN HCL 500 MG PO TABS: 500.0000 mg | ORAL_TABLET | Freq: Two times a day (BID) | ORAL | Status: DC

## 2015-09-16 MED ORDER — LOVASTATIN 20 MG PO TABS: 20.0000 mg | ORAL_TABLET | Freq: Every day | ORAL | Status: DC

## 2015-09-16 MED ORDER — METOPROLOL TARTRATE 25 MG PO TABS: 25.0000 mg | ORAL_TABLET | Freq: Two times a day (BID) | ORAL | Status: DC

## 2015-09-16 MED ORDER — RIVAROXABAN 20 MG PO TABS
20.0000 mg | ORAL_TABLET | Freq: Every day | ORAL | Status: DC
Start: 2015-09-16 — End: 2015-09-24

## 2015-09-16 MED ORDER — ACETAMINOPHEN 325 MG PO TABS: 650.0000 mg | ORAL_TABLET | Freq: Four times a day (QID) | ORAL | Status: DC | PRN

## 2015-09-16 NOTE — Progress Notes (Signed)
Pt d/c to home by car with family. Assessment stable. All questions answered. 

## 2015-09-16 NOTE — Care Management Note (Signed)
Case Management Note  Patient Details  Name: Sufia Mahala MRN: [id number] Date of Birth: November 14, 1961  Subjective/Objective:                  trouble speaking  Action/Plan:  discharge planning Expected Discharge Date:  09/17/15               Expected Discharge Plan:  Home/Self Care  In-House Referral:     Discharge planning Services  CM Consult, Indigent Health Clinic, Medication Assistance  Post Acute Care Choice:    Choice offered to:     DME Arranged:    DME Agency:     HH Arranged:    HH Agency:     Status of Service:  Completed, signed off  Medicare Important Message Given:    Date Medicare IM Given:    Medicare IM give by:    Date Additional Medicare IM Given:    Additional Medicare Important Message give by:     If discussed at Long Length of Stay Meetings, dates discussed:    Additional Comments: CM gave pt free 30 day trial card for xarelto.  Pt verbalized understanding this card will cover the cost of the discharge prescription.  Pt verbalized understanding she is to go to the [facility] for follow up and refills of Xarelto.  Pt has an appt on 09/24/15 at 10:00am; also on AVS.  No other CM needs were communicated. Yves Dill, RN 09/16/2015, 10:28 AM

## 2015-09-16 NOTE — Discharge Instructions (Signed)
Rivaroxaban oral tablets What is this medicine? RIVAROXABAN (ri va ROX a ban) is an anticoagulant (blood thinner). It is used to treat blood clots in the lungs or in the veins. It is also used after knee or hip surgeries to prevent blood clots. It is also used to lower the chance of stroke in people with a medical condition called atrial fibrillation. This medicine may be used for other purposes; ask your health care provider or pharmacist if you have questions. What should I tell my health care provider before I take this medicine? They need to know if you have any of these conditions: -bleeding disorders -bleeding in the brain -blood in your stools (black or tarry stools) or if you have blood in your vomit -history of stomach bleeding -kidney disease -liver disease -low blood counts, like low white cell, platelet, or red cell counts -recent or planned spinal or epidural procedure -take medicines that treat or prevent blood clots -an unusual or allergic reaction to rivaroxaban, other medicines, foods, dyes, or preservatives -pregnant or trying to get pregnant -breast-feeding How should I use this medicine? Take this medicine by mouth with a glass of water. Follow the directions on the prescription label. Take your medicine at regular intervals. Do not take it more often than directed. Do not stop taking except on your doctor's advice. Stopping this medicine may increase your risk of a blood clot. Be sure to refill your prescription before you run out of medicine. If you are taking this medicine after hip or knee replacement surgery, take it with or without food. If you are taking this medicine for atrial fibrillation, take it with your evening meal. If you are taking this medicine to treat blood clots, take it with food at the same time each day. If you are unable to swallow your tablet, you may crush the tablet and mix it in applesauce. Then, immediately eat the applesauce. You should eat more  food right after you eat the applesauce containing the crushed tablet. Talk to your pediatrician regarding the use of this medicine in children. Special care may be needed. Overdosage: If you think you have taken too much of this medicine contact a poison control center or emergency room at once. NOTE: This medicine is only for you. Do not share this medicine with others. What if I miss a dose? If you take your medicine once a day and miss a dose, take the missed dose as soon as you remember. If you take your medicine twice a day and miss a dose, take the missed dose immediately. In this instance, 2 tablets may be taken at the same time. The next day you should take 1 tablet twice a day as directed. What may interact with this medicine? -aspirin and aspirin-like medicines -certain antibiotics like erythromycin, azithromycin, and clarithromycin -certain medicines for fungal infections like ketoconazole and itraconazole -certain medicines for irregular heart beat like amiodarone, quinidine, dronedarone -certain medicines for seizures like carbamazepine, phenytoin -certain medicines that treat or prevent blood clots like warfarin, enoxaparin, and dalteparin -conivaptan -diltiazem -felodipine -indinavir -lopinavir; ritonavir -NSAIDS, medicines for pain and inflammation, like ibuprofen or naproxen -ranolazine -rifampin -ritonavir -St. John's wort -verapamil This list may not describe all possible interactions. Give your health care provider a list of all the medicines, herbs, non-prescription drugs, or dietary supplements you use. Also tell them if you smoke, drink alcohol, or use illegal drugs. Some items may interact with your medicine. What should I watch for while using this   medicine? Visit your doctor or health care professional for regular checks on your progress. Your condition will be monitored carefully while you are receiving this medicine. Notify your doctor or health care  professional and seek emergency treatment if you develop breathing problems; changes in vision; chest pain; severe, sudden headache; pain, swelling, warmth in the leg; trouble speaking; sudden numbness or weakness of the face, arm, or leg. These can be signs that your condition has gotten worse. If you are going to have surgery, tell your doctor or health care professional that you are taking this medicine. Tell your health care professional that you use this medicine before you have a spinal or epidural procedure. Sometimes people who take this medicine have bleeding problems around the spine when they have a spinal or epidural procedure. This bleeding is very rare. If you have a spinal or epidural procedure while on this medicine, call your health care professional immediately if you have back pain, numbness or tingling (especially in your legs and feet), muscle weakness, paralysis, or loss of bladder or bowel control. Avoid sports and activities that might cause injury while you are using this medicine. Severe falls or injuries can cause unseen bleeding. Be careful when using sharp tools or knives. Consider using an Neurosurgeon. Take special care brushing or flossing your teeth. Report any injuries, bruising, or red spots on the skin to your doctor or health care professional. What side effects may I notice from receiving this medicine? Side effects that you should report to your doctor or health care professional as soon as possible: -allergic reactions like skin rash, itching or hives, swelling of the face, lips, or tongue -back pain -redness, blistering, peeling or loosening of the skin, including inside the mouth -signs and symptoms of bleeding such as bloody or black, tarry stools; red or dark-brown urine; spitting up blood or brown material that looks like coffee grounds; red spots on the skin; unusual bruising or bleeding from the eye, gums, or nose Side effects that usually do not require  medical attention (Report these to your doctor or health care professional if they continue or are bothersome.): -dizziness -muscle pain This list may not describe all possible side effects. Call your doctor for medical advice about side effects. You may report side effects to FDA at 1-800-FDA-1088. Where should I keep my medicine? Keep out of the reach of children. Store at room temperature between 15 and 30 degrees C (59 and 86 degrees F). Throw away any unused medicine after the expiration date. NOTE: This sheet is a summary. It may not cover all possible information. If you have questions about this medicine, talk to your doctor, pharmacist, or health care provider.    2016, Elsevier/Gold Standard. (2014-11-15 12:45:34) Stroke Prevention Some health problems and behaviors may make it more likely for you to have a stroke. Below are ways to lessen your risk of having a stroke.   Be active for at least 30 minutes on most or all days.  Do not smoke. Try not to be around others who smoke.  Do not drink too much alcohol.  Do not have more than 2 drinks a day if you are a man.  Do not have more than 1 drink a day if you are a woman and are not pregnant.  Eat healthy foods, such as fruits and vegetables. If you were put on a specific diet, follow the diet as told.  Keep your cholesterol levels under control through diet and medicines.  Look for foods that are low in saturated fat, trans fat, cholesterol, and are high in fiber.  If you have diabetes, follow all diet plans and take your medicine as told.  Ask your doctor if you need treatment to lower your blood pressure. If you have high blood pressure (hypertension), follow all diet plans and take your medicine as told by your doctor.  If you are 66-28 years old, have your blood pressure checked every 3-5 years. If you are age 65 or older, have your blood pressure checked every year.  Keep a healthy weight. Eat foods that are low in  calories, salt, saturated fat, trans fat, and cholesterol.  Do not take drugs.  Avoid birth control pills, if this applies. Talk to your doctor about the risks of taking birth control pills.  Talk to your doctor if you have sleep problems (sleep apnea).  Take all medicine as told by your doctor.  You may be told to take aspirin or blood thinner medicine. Take this medicine as told by your doctor.  Understand your medicine instructions.  Make sure any other conditions you have are being taken care of. GET HELP RIGHT AWAY IF:  You suddenly lose feeling (you feel numb) or have weakness in your face, arm, or leg.  Your face or eyelid hangs down to one side.  You suddenly feel confused.  You have trouble talking (aphasia) or understanding what people are saying.  You suddenly have trouble seeing in one or both eyes.  You suddenly have trouble walking.  You are dizzy.  You lose your balance or your movements are clumsy (uncoordinated).  You suddenly have a very bad headache and you do not know the cause.  You have new chest pain.  Your heart feels like it is fluttering or skipping a beat (irregular heartbeat). Do not wait to see if the symptoms above go away. Get help right away. Call your local emergency services (911 in [place] Do not drive yourself to the hospital.   This information is not intended to replace advice given to you by your health care provider. Make sure you discuss any questions you have with your health care provider.   Document Released: 05/18/2012 Document Revised: 12/08/2014 Document Reviewed: 05/20/2013 Elsevier Interactive Patient Education [employer]

## 2015-09-16 NOTE — Progress Notes (Signed)
Patient is pending discharge. Per [facility] Echo reader, Dr. Purvis Sheffield, discussed with Dr. Butler Denmark, per IM, pt is stable, plan for discharge. I will arrange outpatient cardiology followup in 1 week for decreased LV function noted on echo. Will also obtain repeat limited echo with contrast to more accurately assess LV function.  Ramond Dial PA Pager: [phone]

## 2015-09-16 NOTE — Evaluation (Signed)
Occupational Therapy Evaluation Patient Details Name: Kelly Lang MRN: [id number] DOB: 10-26-1961 Today's Date: 09/16/2015    History of Present Illness Kelly Lang is a 54 y.o. female with a past medical history that includes A. fib, diabetes, obesity, hypertension, dyslipidemia presents to emergency department with the chief complaint of difficulty speaking. 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   Clinical Impression   Pt is functioning independently in ADL and mobility.  She demonstrates R peripheral field blurriness. Instructed in compensatory strategies and safety related to vision changes.  Pt states she thinks her vision has improved since admission. Educated pt in s/s of stroke. No further OT needs.      Follow Up Recommendations  No OT follow up    Equipment Recommendations  None recommended by OT    Recommendations for Other Services       Precautions / Restrictions Precautions Precautions: Fall Restrictions Weight Bearing Restrictions: No      Mobility Bed Mobility Overal bed mobility: Independent                Transfers Overall transfer level: Independent                    Balance                                            ADL Overall ADL's : Independent                                       General ADL Comments: Educated pt in safety and compensatory strategies for R peripheral vision deficits.     Vision     Perception     Praxis      Pertinent Vitals/Pain Pain Assessment: No/denies pain     Hand Dominance Right   Extremity/Trunk Assessment Upper Extremity Assessment Upper Extremity Assessment: Overall WFL for tasks assessed   Lower Extremity Assessment Lower Extremity Assessment: Overall WFL for tasks assessed   Cervical / Trunk Assessment Cervical / Trunk Assessment: Normal   Communication Communication Communication: No difficulties    Cognition Arousal/Alertness: Awake/alert Behavior During Therapy: WFL for tasks assessed/performed Overall Cognitive Status: Within Functional Limits for tasks assessed                     General Comments       Exercises       Shoulder Instructions      Home Living Family/patient expects to be discharged to:: Private residence Living Arrangements: Other relatives Available Help at Discharge: Friend(s);Available 24 hours/day Type of Home: House Home Access: Stairs to enter [employer] of Steps: 2 Entrance Stairs-Rails: Left Home Layout: Able to live on main level with bedroom/bathroom;Two level Alternate Level Stairs-Number of Steps: 13 Alternate Level Stairs-Rails: Left Bathroom Shower/Tub: Producer, television/film/video: Standard     Home Equipment: None          Prior Functioning/Environment Level of Independence: Independent        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    OT Diagnosis: Disturbance of vision   OT Problem List:     OT Treatment/Interventions:      OT Goals(Current goals can be found in the care plan section) Acute Rehab OT Goals Patient  Stated Goal: home  OT Frequency:     Barriers to D/C:            Co-evaluation              End of Session    Activity Tolerance: Patient tolerated treatment well Patient left: in bed;with call bell/phone within reach   Time: 1215-1230 OT Time Calculation (min): 15 min Charges:  OT General Charges $OT Visit: 1 Procedure OT Evaluation $Initial OT Evaluation Tier I: 1 Procedure G-Codes:    Evern Bio 09/16/2015, 12:53 PM [phone]

## 2015-09-17 ENCOUNTER — Telehealth: Payer: Self-pay | Admitting: Cardiovascular Disease

## 2015-09-17 LAB — URINE CULTURE

## 2015-09-17 LAB — HEMOGLOBIN A1C
HEMOGLOBIN A1C: 10.6 % — AB (ref 4.8–5.6)
MEAN PLASMA GLUCOSE: 258 mg/dL

## 2015-09-17 NOTE — Telephone Encounter (Signed)
Patient contacted regarding discharge from [facility] on 10/16.  Patient understands to follow up with provider Norma Fredrickson, NP on 10/24 at 0800 at [facility]. Patient understands discharge instructions? Yes Patient understands medications and regiment? Yes Patient understands to bring all medications to this visit? Yes  Patient denies questions or concerns; I advised her to call back if needed prior to next Monday.  She thanked me for the call.

## 2015-09-17 NOTE — Telephone Encounter (Signed)
TOC per Hao/staff message  10/24 @ 8am w/ Lawson Fiscal

## 2015-09-22 NOTE — Discharge Summary (Addendum)
Physician Discharge Summary  Kelly Lang GKK:[id number] DOB: [birth year] DOA: 09/14/2015  PCP: Jeanine Luz, FNP  Admit date: 09/14/2015 Discharge date: 09/16/2015  Time spent: 60 minutes     Discharge Condition: stable  Discharge Diagnoses:  Principal Problem:   Embolic infarction [facility]) Active Problems:   Expressive aphasia   Atrial fibrillation    H/O: hypothyroidism   Obesity   Type II diabetes mellitus with manifestations (HCC)   Dyslipidemia   Essential hypertension, benign   Low back pain   Thyroid mass   Cardiomyopathy (HCC)   Chronic systolic CHF (congestive heart failure) (HCC)   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 Per history obtained from her today, she admitted to having some difficulty with vision the night before coming into the hospital. She specifically states that she was having trouble seeing out of her right eye as her vision was very blurry. It continued to be blurry and she went to bed. When she woke up in the morning she noted that she was having trouble getting her words out. At that time she didn't did not notice any visual disturbance. She did not have any other focal neurological symptoms. However, it was noted in the ER that she had some trouble with her gait. She states that she stopped taking all of her medications in May of this year. She last was seen at the [facility] clinic over a year ago. She did follow up with cardiology in regards to her atrial fibrillation in March of this year but as mentioned, has not taking any medication since May.   Hospital Course:  Acute ischemic infarct -Acute Left temporoparietal seen  on MRI-see report below -Most likely embolic due to underlying atrial fibrillation -CTA of head and neck obtained-no significant carotid or vertebral artery stenosis and neck, no intracranial stenosis - speech difficulty has resolved completely and no recurrence of visual disturbance -Lipid profile reveals a cholesterol of 185, triglycerides 185, HDL 31, LDL 117-started statin - -Neurology has evaluated the patient and is recommending anticoagulation for A. fib -no need to start aspirin at this time - will start Xarelto- see below  Atrial fibrillation - CHA2DS2-VASc Score 5-(history of systolic heart failure, diabetes, female sex, CVA) -Status post unsuccessful d/c cardioversion -Previously on flecainide but states she could not afford this-  -In the ER EKG revealed A. fib which was rate controlled at 87 bpm -Has been started on Xarelto-she will obtain a 30 day free coupon from the hospital and subsequently will be able to obtain samples from the [facility] wellness clinic - HR has been in low 100s today at rest- started on Metoprolol with improvement in rate to 70-90 range - I discussed with Cardiology (Dr Elberta Fortis) about whether Flecainide needs to be resumed - recommended not to resume but rather f/u in office with Dr Elease Hashimoto  Diabetes mellitus type 2-non-insulin-requiring - -Will resume metformin and glipizide for diabetes as she was taking in the past-hemoglobin A1c is still pending-last hemoglobin A1c from 12/15 was 8.1  Chronic systolic heart failure -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 -2-D echo from 2014 revealed an EF of 40-45%-diastolic function could not be determined - current ECHO reveals a significant drop  in EF to 20-25 % with global hypokinesis - will start low dose Lisinopril- control HR to prevent further deterioration in function - have spoken with cardiology in regards to ECHO today (Dr Purvis Sheffield)- he will schedule an ECHO with  contrast as outpt and arrange a follow up appt for her as well  Hyperlipidemia Started Mevacor which is on the [employer] drug list (as she has no insurance)-  Hypothyroidism/ thyroid mass  - Complex large right thyroid mass noted on imaging which is was present in 2006 - d/w IR Dr Bonnielee Haff- will need to obtain thyroid ultrasound- if mass is unchanged from 2006, no need for biopsy - Check Thyroid functions  UTI? -Asymptomatic-discontinued antibiotics   Consultants:Neurology Procedures: Left ventricle: Systolic function was difficult to assess in that the endocardial borders were suboptimally visualized, patient was in rapid atrial fibrillation at times, and short cycles were taken when imaged. However, it appears severely reduced, EF 20-25%. Globally hypokinetic. The cavity size was mildly dilated. Wall thickness was normal. The study was not technically sufficient to allow evaluation of LV diastolic dysfunction due to atrial fibrillation. - Aorta: Mild aortic root dilatation. Aortic root dimension: 41 mm (ED). - Mitral valve: There was mild regurgitation. - Left atrium: The atrium was severely dilated. Volume/bsa, ES, (1-plane Simpson&'s, A2C): 46 ml/m^2. - Right ventricle: Systolic function was mildly reduced. - Right atrium: The atrium was mildly dilated. - Tricuspid valve: There was mild regurgitation. - Pulmonary arteries: Systolic pressure was mildly increased. PA peak pressure: 40 mm Hg (S). - Systemic veins: IVC dilated with normal respiratory variation. Estimated CVP 8 mmHg.   Discharge Exam: Filed Weights   09/14/15 1153  Weight: 106.913 kg (235 lb 11.2 oz)   Filed Vitals:   09/16/15 1427  BP: 100/62  Pulse: 71  Temp: 99.1 F (37.3 C)  Resp: 18    General: AAO x 3, no distress Cardiovascular: RRR, no murmurs  Respiratory: clear to auscultation bilaterally GI: soft, non-tender, non-distended, bowel sound positive  Discharge  Instructions You were cared for by a hospitalist during your hospital stay. If you have any questions about your discharge medications or the care you received while you were in the hospital after you are discharged, you can call the unit and asked to speak with the hospitalist on call if the hospitalist that took care of you is not available. Once you are discharged, your primary care physician will handle any further medical issues. Please note that NO REFILLS for any discharge medications will be authorized once you are discharged, as it is imperative that you return to your primary care physician (or establish a relationship with a primary care physician if you do not have one) for your aftercare needs so that they can reassess your need for medications and monitor your lab values.  Discharge Instructions    (HEART FAILURE PATIENTS) Call MD:  Anytime you have any of the following symptoms: 1) 3 pound weight gain in 24 hours or 5 pounds in 1 week 2) shortness of breath, with or without a dry hacking cough 3) swelling in the hands, feet or stomach 4) if you have to sleep on extra pillows at night in order to breathe.    Complete by:  As directed      Ambulatory referral to Neurology    Complete by:  As directed   Pt will follow up with Dr. Roda Shutters at [facility] in about 2 months. Thanks.     Discharge instructions    Complete  by:  As directed   Low sodium, heart healthy, diabetic diet Follow up with [place] [facility] (appointment has been scheduled) Cardiology will call you to follow up for a repeat ECHO and office visit     Increase activity slowly    Complete by:  As directed             Medication List    TAKE these medications        acetaminophen 325 MG tablet  Commonly known as:  TYLENOL  Take 2 tablets (650 mg total) by mouth every 6 (six) hours as needed for mild pain or headache.     glipiZIDE 5 MG tablet  Commonly known as:  GLUCOTROL  Take 1 tablet (5 mg total) by mouth  daily before breakfast.     lisinopril 10 MG tablet  Commonly known as:  PRINIVIL  Take 0.5 tablets (5 mg total) by mouth daily.     lovastatin 20 MG tablet  Commonly known as:  MEVACOR  Take 1 tablet (20 mg total) by mouth at bedtime.     metFORMIN 500 MG tablet  Commonly known as:  GLUCOPHAGE  Take 1 tablet (500 mg total) by mouth 2 (two) times daily with a meal.     metoprolol tartrate 25 MG tablet  Commonly known as:  LOPRESSOR  Take 1 tablet (25 mg total) by mouth 2 (two) times daily.     rivaroxaban 20 MG Tabs tablet  Commonly known as:  XARELTO  Take 1 tablet (20 mg total) by mouth daily with supper.       Allergies  Allergen Reactions  . Codeine Itching and Rash       Follow-up Information    Follow up with [facility]    .   Why:  You have an appointment with Dr. Mardelle Matte at 10:00 on 09/24/15   Contact information:   [address] [place] [address] [phone]      Follow up with Xu,Jindong, MD. Schedule an appointment as soon as possible for a visit in 2 months.   Specialty:  Neurology   Why:  stroke clinic   Contact information:   [address] [place] [address] [phone]       Follow up with Nahser, [name], MD.   Specialty:  Cardiology   Why:  his office will call you but if they do not call in the next 3 days, you should call them and get an appt to be seen in the next 1-2 wks   Contact information:   [address] [place] [address] [phone]        The results of significant diagnostics from this hospitalization (including imaging, microbiology, ancillary and laboratory) are listed below for reference.    Significant Diagnostic Studies: Ct Angio Head W/cm &/or Wo Cm  09/15/2015  CLINICAL DATA:  Stroke.  Acute infarct left parietal lobe. EXAM: CT ANGIOGRAPHY HEAD AND NECK TECHNIQUE: Multidetector CT imaging of the head and neck was  performed using the standard protocol during bolus administration of intravenous contrast. Multiplanar CT image reconstructions and MIPs were obtained to evaluate the vascular anatomy. Carotid stenosis measurements (when applicable) are obtained utilizing NASCET criteria, using the distal internal carotid diameter as the denominator. CONTRAST:  50mL OMNIPAQUE IOHEXOL 350 MG/ML SOLN COMPARISON:  MRI 09/14/2015 FINDINGS: CT HEAD Brain: Acute infarct in the left temporoparietal lobe as noted on MRI.  This is similar in size to the infarct on MRI. No associated hemorrhage. No other infarct. Ventricle size is normal. No shift of the midline structures. Calvarium and skull base: Negative Paranasal sinuses: Negative Orbits: Negative CTA NECK Aortic arch: Normal aortic arch. Proximal great vessels widely patent. Right carotid system: Right carotid artery widely patent without significant stenosis. Negative for dissection. Left carotid system: Left carotid artery widely patent without significant stenosis or dissection. Vertebral arteries:Both vertebral arteries patent to the basilar without significant stenosis or dissection. Skeleton: Negative for acute bony abnormality Other neck: Large mass in the right thyroid measuring 34 x 21 x 50 mm. This has heterogeneous density with peripheral calcification. Similar to the ultrasound of 11/03/2005. Based on the CT scan, this is concerning for malignancy and biopsy would be recommended. It is reassuring that this lesion appears stable based on comparison with prior ultrasound. CTA HEAD Anterior circulation: Cavernous carotid shows mild atherosclerotic disease without significant stenosis. Anterior and middle cerebral arteries are patent. There is slight hypoperfusion in the area of acute infarct. No large vessel occlusion or filling defect identified. Negative for aneurysm in the anterior circulation Posterior circulation: Both vertebral arteries are patent to the basilar. Right  PICA patent. Left PICA not visualized. Basilar widely patent. Superior cerebellar and posterior cerebral arteries widely patent without stenosis or aneurysm. Venous sinuses: Patent Anatomic variants: None Delayed phase: Normal enhancement on delayed imaging. The acute infarct does not enhance at this point. IMPRESSION: Acute infarct left temporoparietal lobe as noted on recent MRI. No associated hemorrhage. No significant carotid or vertebral artery stenosis in the neck. Negative for intracranial stenosis or filling defect. The left temporoparietal infarct most likely is embolic in nature. Large complex mass right thyroid measuring 34 x 21 x 15 mm. This is similar to the ultrasound 11/03/2005. Based on the CT findings, this is considered suspicious for malignancy and biopsy is recommended. Electronically Signed   By: Marlan Palau M.D.   On: 09/15/2015 10:37   Dg Chest 2 View  09/14/2015  CLINICAL DATA:  Atrial fibrillation.  Hyperglycemia. EXAM: CHEST - 2 VIEW COMPARISON:  One-view chest 10/25/2013. FINDINGS: The heart size is exaggerated by low lung volumes. Mild interstitial edema is present. Small effusions are noted. No significant airspace consolidation is present. The visualized soft tissues and bony thorax are unremarkable. IMPRESSION: 1. Mild edema and effusions suggesting congestive heart failure. 2. No focal airspace consolidation Electronically Signed   By: Marin Roberts M.D.   On: 09/14/2015 12:53   Ct Angio Neck W/cm &/or Wo/cm  09/15/2015  CLINICAL DATA:  Stroke.  Acute infarct left parietal lobe. EXAM: CT ANGIOGRAPHY HEAD AND NECK TECHNIQUE: Multidetector CT imaging of the head and neck was performed using the standard protocol during bolus administration of intravenous contrast. Multiplanar CT image reconstructions and MIPs were obtained to evaluate the vascular anatomy. Carotid stenosis measurements (when applicable) are obtained utilizing NASCET criteria, using the distal internal  carotid diameter as the denominator. CONTRAST:  50mL OMNIPAQUE IOHEXOL 350 MG/ML SOLN COMPARISON:  MRI 09/14/2015 FINDINGS: CT HEAD Brain: Acute infarct in the left temporoparietal lobe as noted on MRI. This is similar in size to the infarct on MRI. No associated hemorrhage. No other infarct. Ventricle size is normal. No shift of the midline structures. Calvarium and skull base: Negative Paranasal sinuses: Negative Orbits: Negative CTA NECK Aortic arch: Normal aortic arch. Proximal great vessels widely patent. Right carotid system: Right carotid artery widely patent without significant stenosis. Negative for dissection. Left  carotid system: Left carotid artery widely patent without significant stenosis or dissection. Vertebral arteries:Both vertebral arteries patent to the basilar without significant stenosis or dissection. Skeleton: Negative for acute bony abnormality Other neck: Large mass in the right thyroid measuring 34 x 21 x 50 mm. This has heterogeneous density with peripheral calcification. Similar to the ultrasound of 11/03/2005. Based on the CT scan, this is concerning for malignancy and biopsy would be recommended. It is reassuring that this lesion appears stable based on comparison with prior ultrasound. CTA HEAD Anterior circulation: Cavernous carotid shows mild atherosclerotic disease without significant stenosis. Anterior and middle cerebral arteries are patent. There is slight hypoperfusion in the area of acute infarct. No large vessel occlusion or filling defect identified. Negative for aneurysm in the anterior circulation Posterior circulation: Both vertebral arteries are patent to the basilar. Right PICA patent. Left PICA not visualized. Basilar widely patent. Superior cerebellar and posterior cerebral arteries widely patent without stenosis or aneurysm. Venous sinuses: Patent Anatomic variants: None Delayed phase: Normal enhancement on delayed imaging. The acute infarct does not enhance at this  point. IMPRESSION: Acute infarct left temporoparietal lobe as noted on recent MRI. No associated hemorrhage. No significant carotid or vertebral artery stenosis in the neck. Negative for intracranial stenosis or filling defect. The left temporoparietal infarct most likely is embolic in nature. Large complex mass right thyroid measuring 34 x 21 x 15 mm. This is similar to the ultrasound 11/03/2005. Based on the CT findings, this is considered suspicious for malignancy and biopsy is recommended. Electronically Signed   By: Marlan Palau M.D.   On: 09/15/2015 10:37   Mr Brain Wo Contrast (neuro Protocol)  09/14/2015  CLINICAL DATA:  Expressive aphasia. Atrial fibrillation, diabetes, dyslipidemia, obesity. Headache and dizziness and visual disturbance. EXAM: MRI HEAD WITHOUT CONTRAST TECHNIQUE: Multiplanar, multiecho pulse sequences of the brain and surrounding structures were obtained without intravenous contrast. COMPARISON:  None. FINDINGS: Acute infarct in the left temporoparietal lobe measuring approximately 3 x 4 cm. This is likely embolic given the history. No other areas of acute or chronic infarction. Focal micro hemorrhage in the left occipital lobe, separate from the acute infarct. This may be due to a prior embolus or hypertensive bleed. Basal ganglia and brainstem normal. Cerebellum normal. Ventricle size normal. Negative for mass or edema.  No shift of the midline structures. Paranasal sinuses are clear. IMPRESSION: Acute infarct left temporoparietal lobe, probably in the embolic infarct given the history of atrial fibrillation. Chronic micro hemorrhage left occipital lobe Electronically Signed   By: Marlan Palau M.D.   On: 09/14/2015 16:19   US Soft Tissue Head/neck  09/16/2015  CLINICAL DATA:  Thyroid nodule noted by CT EXAM: THYROID ULTRASOUND TECHNIQUE: Ultrasound examination of the thyroid gland and adjacent soft tissues was performed. COMPARISON:  11/03/2005 FINDINGS: Right thyroid lobe  Measurements: 5.7 x 3.2 x 3.6 cm. 4.3 x 2.7 x 3.2 complex and predominately solid mass in the right mid lobe with central calcifications previously measured up to 6.0 cm and is therefore smaller. Left thyroid lobe Measurements: 3.4 x 1.5 x 1.2 cm. Multiple small nodules are present. The largest is in the mid lobe measuring 8 mm. Isthmus Thickness: 5 mm.  No nodules visualized. Lymphadenopathy None visualized. IMPRESSION: Bilateral nodules as described. The dominant nodule in the right lobe today measures 4.3 cm and previously measured up to 6.0 cm. This supports benign etiology. Electronically Signed   By: Jolaine Click M.D.   On: 09/16/2015 08:24  Microbiology: Recent Results (from the past 240 hour(s))  Culture, Urine     Status: None   Collection Time: 09/14/15  1:45 PM  Result Value Ref Range Status   Specimen Description URINE, CLEAN CATCH  Final   Special Requests NONE  Final   Culture >=100,000 COLONIES/mL ESCHERICHIA COLI  Final   Report Status 09/17/2015 FINAL  Final   Organism ID, Bacteria ESCHERICHIA COLI  Final      Susceptibility   Escherichia coli - MIC*    AMPICILLIN <=2 SENSITIVE Sensitive     CEFAZOLIN <=4 SENSITIVE Sensitive     CEFTRIAXONE <=1 SENSITIVE Sensitive     CIPROFLOXACIN <=0.25 SENSITIVE Sensitive     GENTAMICIN <=1 SENSITIVE Sensitive     IMIPENEM <=0.25 SENSITIVE Sensitive     NITROFURANTOIN <=16 SENSITIVE Sensitive     TRIMETH/SULFA <=20 SENSITIVE Sensitive     AMPICILLIN/SULBACTAM <=2 SENSITIVE Sensitive     PIP/TAZO <=4 SENSITIVE Sensitive     * >=100,000 COLONIES/mL ESCHERICHIA COLI     Labs: Basic Metabolic Panel: No results for input(s): NA, K, CL, CO2, GLUCOSE, BUN, CREATININE, CALCIUM, MG, PHOS in the last 168 hours. Liver Function Tests: No results for input(s): AST, ALT, ALKPHOS, BILITOT, PROT, ALBUMIN in the last 168 hours. No results for input(s): LIPASE, AMYLASE in the last 168 hours. No results for input(s): AMMONIA in the last 168  hours. CBC:  Recent Labs Lab 09/16/15 0440  WBC 8.0  HGB 14.4  HCT 42.6  MCV 93.8  PLT 225   Cardiac Enzymes: No results for input(s): CKTOTAL, CKMB, CKMBINDEX, TROPONINI in the last 168 hours. BNP: BNP (last 3 results)  Recent Labs  09/14/15 1524  BNP 323.7*    ProBNP (last 3 results) No results for input(s): PROBNP in the last 8760 hours.  CBG:  Recent Labs Lab 09/15/15 1211 09/15/15 1658 09/15/15 2147 09/16/15 0618 09/16/15 1125  GLUCAP 247* 173* 145* 176* 156*       SignedCalvert Cantor, MD [facility] 09/22/2015, 10:39 AM

## 2015-09-24 ENCOUNTER — Ambulatory Visit (INDEPENDENT_AMBULATORY_CARE_PROVIDER_SITE_OTHER): Payer: Self-pay | Admitting: Nurse Practitioner

## 2015-09-24 ENCOUNTER — Encounter: Payer: Self-pay | Admitting: Nurse Practitioner

## 2015-09-24 ENCOUNTER — Other Ambulatory Visit: Payer: Self-pay | Admitting: Nurse Practitioner

## 2015-09-24 ENCOUNTER — Encounter: Payer: Self-pay | Admitting: Internal Medicine

## 2015-09-24 ENCOUNTER — Ambulatory Visit: Payer: Self-pay | Attending: Internal Medicine | Admitting: Internal Medicine

## 2015-09-24 VITALS — BP 137/87 | HR 94 | Temp 98.0°F | Resp 16 | Wt 229.0 lb

## 2015-09-24 VITALS — BP 108/74 | HR 84 | Ht 65.5 in | Wt 229.2 lb

## 2015-09-24 DIAGNOSIS — Z9114 Patient's other noncompliance with medication regimen: Secondary | ICD-10-CM | POA: Insufficient documentation

## 2015-09-24 DIAGNOSIS — Z7902 Long term (current) use of antithrombotics/antiplatelets: Secondary | ICD-10-CM | POA: Insufficient documentation

## 2015-09-24 DIAGNOSIS — Z87891 Personal history of nicotine dependence: Secondary | ICD-10-CM | POA: Insufficient documentation

## 2015-09-24 DIAGNOSIS — Z8249 Family history of ischemic heart disease and other diseases of the circulatory system: Secondary | ICD-10-CM | POA: Insufficient documentation

## 2015-09-24 DIAGNOSIS — E785 Hyperlipidemia, unspecified: Secondary | ICD-10-CM

## 2015-09-24 DIAGNOSIS — E669 Obesity, unspecified: Secondary | ICD-10-CM | POA: Insufficient documentation

## 2015-09-24 DIAGNOSIS — I749 Embolism and thrombosis of unspecified artery: Secondary | ICD-10-CM

## 2015-09-24 DIAGNOSIS — E079 Disorder of thyroid, unspecified: Secondary | ICD-10-CM | POA: Insufficient documentation

## 2015-09-24 DIAGNOSIS — I5022 Chronic systolic (congestive) heart failure: Secondary | ICD-10-CM | POA: Insufficient documentation

## 2015-09-24 DIAGNOSIS — Z79899 Other long term (current) drug therapy: Secondary | ICD-10-CM | POA: Insufficient documentation

## 2015-09-24 DIAGNOSIS — Z7984 Long term (current) use of oral hypoglycemic drugs: Secondary | ICD-10-CM | POA: Insufficient documentation

## 2015-09-24 DIAGNOSIS — I482 Chronic atrial fibrillation, unspecified: Secondary | ICD-10-CM

## 2015-09-24 DIAGNOSIS — Z7901 Long term (current) use of anticoagulants: Secondary | ICD-10-CM

## 2015-09-24 DIAGNOSIS — Z8673 Personal history of transient ischemic attack (TIA), and cerebral infarction without residual deficits: Secondary | ICD-10-CM | POA: Insufficient documentation

## 2015-09-24 DIAGNOSIS — E119 Type 2 diabetes mellitus without complications: Secondary | ICD-10-CM | POA: Insufficient documentation

## 2015-09-24 DIAGNOSIS — Z885 Allergy status to narcotic agent status: Secondary | ICD-10-CM | POA: Insufficient documentation

## 2015-09-24 DIAGNOSIS — I429 Cardiomyopathy, unspecified: Secondary | ICD-10-CM | POA: Insufficient documentation

## 2015-09-24 DIAGNOSIS — I4891 Unspecified atrial fibrillation: Secondary | ICD-10-CM | POA: Insufficient documentation

## 2015-09-24 DIAGNOSIS — I639 Cerebral infarction, unspecified: Secondary | ICD-10-CM

## 2015-09-24 LAB — CBC
HCT: 45.6 % (ref 36.0–46.0)
Hemoglobin: 15.4 g/dL — ABNORMAL HIGH (ref 12.0–15.0)
MCH: 32 pg (ref 26.0–34.0)
MCHC: 33.8 g/dL (ref 30.0–36.0)
MCV: 94.6 fL (ref 78.0–100.0)
MPV: 10.2 fL (ref 8.6–12.4)
Platelets: 255 10*3/uL (ref 150–400)
RBC: 4.82 MIL/uL (ref 3.87–5.11)
RDW: 12.8 % (ref 11.5–15.5)
WBC: 6.5 10*3/uL (ref 4.0–10.5)

## 2015-09-24 LAB — GLUCOSE, POCT (MANUAL RESULT ENTRY): POC Glucose: 191 mg/dl — AB (ref 70–99)

## 2015-09-24 MED ORDER — LISINOPRIL 10 MG PO TABS: 5.0000 mg | ORAL_TABLET | Freq: Every day | ORAL | Status: DC

## 2015-09-24 MED ORDER — METOPROLOL TARTRATE 25 MG PO TABS: 37.5000 mg | ORAL_TABLET | Freq: Two times a day (BID) | ORAL | Status: DC

## 2015-09-24 MED ORDER — LOVASTATIN 20 MG PO TABS: 20.0000 mg | ORAL_TABLET | Freq: Every day | ORAL | Status: DC

## 2015-09-24 MED ORDER — RIVAROXABAN 20 MG PO TABS: 20.0000 mg | ORAL_TABLET | Freq: Every day | ORAL | Status: DC

## 2015-09-24 MED ORDER — GLIPIZIDE 5 MG PO TABS: 5.0000 mg | ORAL_TABLET | Freq: Every day | ORAL | Status: DC

## 2015-09-24 MED ORDER — METFORMIN HCL 500 MG PO TABS: 500.0000 mg | ORAL_TABLET | Freq: Two times a day (BID) | ORAL | Status: DC

## 2015-09-24 NOTE — Progress Notes (Signed)
CARDIOLOGY OFFICE NOTE  Date:  09/24/2015    Kelly Lang Date of Birth: [birth year] Medical Record #[id number]  PCP:  Ambrose Finland, NP  Cardiologist:  Nahser    Chief Complaint  Patient presents with  . Cerebrovascular Accident    TOC/post hospital visit - seen for Dr. Elease Hashimoto    History of Present Illness: Kelly Lang is a 54 y.o. female who presents today for a TOC/post hospital visit. Seen for Dr. Elease Hashimoto. She has a history of PAF - previously on Flecainide, DM - uncontrolled, chronic systolic heart failure with past tachycardia mediated induced CM, HLD, large right thyroid mass, and obesity.   Last seen in March of this year.   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 Placed on Xarelto. CHADSVASC is 5. EF has dropped back to 20 to 25% range.   Comes in today. Here with lady that she identifies "as like my mom". Pretty tearful. She is tired. "Can't do what I want". Still has trouble finding her words. Says she is not able to write. No chest pain. Breathing is good. No swelling. Rarely dizzy. Some balance issues. Tolerating her medicines. Going to [employer] this morning to find out about her Xarelto. Has not filled out indigent paperwork. Financial counselor did not see. Has limited echo for next week - not clear to me as to why.   Past Medical History  Diagnosis Date  . Diabetes mellitus (HCC)     a. hg A1c 10, newly diagnosed (10/26/13)  . Obesity   . H/O: hypothyroidism     a. thyroid biopsy 1996 with synthroid. Stopped taking rx and was loss to follow up b. normal thyroid function 10/20/13  . Arthritis   . Atrial fibrillation with RVR (HCC)     a diagnosed 10/25/2013  . Tachycardia induced cardiomyopathy (HCC)     a. 2/2 Afib with RVR for unknown duration (EF: 40-45%)  . Dyslipidemia   . Hx of cardiovascular stress test    ETT/Lexiscan Myoview (12/2013):  No ischemia; not gated; low risk.  . Chicken pox   . Allergy   . Thyroid disease     Past Surgical History  Procedure Laterality Date  . Biopsy thyroid      1996  . Tonsillectomy  1971  . Eye muscle surgery Right ~ 1966  . Cardioversion N/A 12/07/2013    Procedure: CARDIOVERSION;  Surgeon: Everette Rank, MD;  Location: [facility] ENDOSCOPY;  Service: Cardiovascular;  Laterality: N/A;     Medications: Current Outpatient Prescriptions  Medication Sig Dispense Refill  . acetaminophen (TYLENOL) 325 MG tablet Take 2 tablets (650 mg total) by mouth every 6 (six) hours as needed for mild pain or headache.    Marland Kitchen glipiZIDE (GLUCOTROL) 5 MG tablet Take 1 tablet (5 mg total) by mouth daily before breakfast. 30 tablet 0  . lisinopril (PRINIVIL) 10 MG tablet Take 0.5 tablets (5 mg total) by mouth daily. 45 tablet 3  . lovastatin (MEVACOR) 20 MG tablet Take 1 tablet (20 mg total) by mouth at bedtime. 30 tablet 0  . metFORMIN (GLUCOPHAGE) 500 MG tablet Take 1 tablet (500 mg total) by mouth 2 (two) times daily with a meal. 60 tablet 0  . metoprolol tartrate (LOPRESSOR) 25 MG tablet Take 1.5 tablets (37.5 mg total) by mouth 2 (two) times daily. 90 tablet 3  .  rivaroxaban (XARELTO) 20 MG TABS tablet Take 1 tablet (20 mg total) by mouth daily with supper. 30 tablet 3   No current facility-administered medications for this visit.    Allergies: Allergies  Allergen Reactions  . Codeine Itching and Rash    Social History: The patient  reports that she quit smoking about 4 years ago. Her smoking use included Cigarettes. She has a 22 pack-year smoking history. She has never used smokeless tobacco. She reports that she drinks alcohol. She reports that she does not use illicit drugs.   Family History: The patient's family history includes Hyperlipidemia in her mother; Hypertension in her mother.   Review of Systems: Please see the history of present illness.   Otherwise, the  review of systems is positive for none.   All other systems are reviewed and negative.   Physical Exam: VS:  BP 108/74 mmHg  Pulse 84  Ht 5' 5.5" (1.664 m)  Wt 229 lb 3.2 oz (103.964 kg)  BMI 37.55 kg/m2  SpO2 99%  LMP 04/15/2015 (LMP Unknown) .  BMI Body mass index is 37.55 kg/(m^2).  Wt Readings from Last 3 Encounters:  09/24/15 229 lb 3.2 oz (103.964 kg)  09/14/15 235 lb 11.2 oz (106.913 kg)  02/13/15 237 lb 1.9 oz (107.557 kg)    General: Tearful.  She is obese. Weight is down. She is in no acute distress.  HEENT: Normal. Neck: Supple, no JVD, carotid bruits, or masses noted.  Cardiac: Irregular irregular rhythm. No murmurs, rubs, or gallops. No edema.  Respiratory:  Lungs are clear to auscultation bilaterally with normal work of breathing.  GI: Soft and nontender.  MS: No deformity or atrophy. Gait and ROM intact. Skin: Warm and dry. Color is normal.  Neuro:  Strength and sensation are intact and no gross focal deficits noted.  Psych: Alert, appropriate and with normal affect.   LABORATORY DATA:  EKG:  EKG is not ordered today.  Lab Results  Component Value Date   WBC 8.0 09/16/2015   HGB 14.4 09/16/2015   HCT 42.6 09/16/2015   PLT 225 09/16/2015   GLUCOSE 216* 09/14/2015   CHOL 185 09/14/2015   TRIG 185* 09/14/2015   HDL 31* 09/14/2015   LDLDIRECT 114.0 02/13/2015   LDLCALC 117* 09/14/2015   ALT 22 02/13/2015   AST 14 02/13/2015   NA 135 09/14/2015   K 4.5 09/14/2015   CL 103 09/14/2015   CREATININE 0.65 09/14/2015   BUN 16 09/14/2015   CO2 24 09/14/2015   TSH 0.950 09/15/2015   INR 1.53* 09/16/2015   HGBA1C 10.6* 09/14/2015   MICROALBUR 2.9* 11/13/2014    BNP (last 3 results)  Recent Labs  09/14/15 1524  BNP 323.7*    ProBNP (last 3 results) No results for input(s): PROBNP in the last 8760 hours.   Other Studies Reviewed Today:  Echo from 09/2015 Left ventricle: Systolic function was difficult to assess in that the endocardial  borders were suboptimally visualized, patient was in rapid atrial fibrillation at times, and short cycles were taken when imaged. However, it appears severely reduced, EF 20-25%. Globally hypokinetic. The cavity size was mildly dilated. Wall thickness was normal. The study was not technically sufficient to allow evaluation of LV diastolic dysfunction due to atrial fibrillation. - Aorta: Mild aortic root dilatation. Aortic root dimension: 41 mm (ED). - Mitral valve: There was mild regurgitation. - Left atrium: The atrium was severely dilated. Volume/bsa, ES, (1-plane Simpson&'s, A2C): 46 ml/m^2. - Right ventricle: Systolic function  was mildly reduced. - Right atrium: The atrium was mildly dilated. - Tricuspid valve: There was mild regurgitation. - Pulmonary arteries: Systolic pressure was mildly increased. PA peak pressure: 40 mm Hg (S). - Systemic veins: IVC dilated with normal respiratory variation. Estimated CVP 8 mmHg.  Myoview Impression from 12/2013 Exercise Capacity: Poor exercise capacity. BP Response: Normal blood pressure response.Changed to Lexiscan.  Clinical Symptoms: No significant symptoms noted. ECG Impression: No significant ST segment change suggestive of ischemia. Comparison with Prior Nuclear Study: No images to compare  Overall Impression: Low risk stress nuclear study with no significant areas of ischemia. .  LV Ejection Fraction: Study not gated. LV Wall Motion: Non gated. Atrial fibrillation noted through study.  Donato Schultz, MD    Assessment/Plan: 1. Embolic stroke - back on anticoagulation and will be committed life long. May have issue with long term Xarelto. Seeing [employer] - ? Try indigent paperwork. She will let us know the update.   2. Recurrent atrial fibrillation - rate is ok. On anticoagulation. She is not able to afford Flecainide. Has considerable LAE on echo - would favor rate control since she is  committed to long term anticoagulation.  3. Chronic anticoagulation - no problems noted so far.   4. Obesity  5. DM - uncontrolled by A1c results - seeing PCP later this morning.   6. Thyroid mass - needs to discuss with PCP for further evaluation  7. Systolic HF - EF has dropped to 20 to 25% - will try to increase her Lopressor to 37.5 mg BID. Will need repeat echo in 3 months to recheck.  8. Noncompliance due to loss of insurance/money - consider Engineer, site.    Current medicines are reviewed with the patient today.  The patient does not have concerns regarding medicines other than what has been noted above.  The following changes have been made:  See above.  Labs/ tests ordered today include:    Orders Placed This Encounter  Procedures  . Basic metabolic panel  . CBC     Disposition:   FU with Dr. Elease Hashimoto in 1 months.   Patient is agreeable to this plan and will call if any problems develop in the interim.   Signed: Rosalio Macadamia, RN, ANP-C 09/24/2015 8:36 AM  [facility] [address] [place], [place]  [address] Phone: [phone] Fax: [phone]

## 2015-09-24 NOTE — Progress Notes (Signed)
Patient here for follow up from hospital  Patient was admitted for a stroke Complains of her balance being off Having trouble with some words And her vision is a little off

## 2015-09-24 NOTE — Progress Notes (Signed)
Patient ID: Kelly Lang, female   DOB: Jan 12, 1961, 55 y.o.   MRN: [id number]  CC: HFU  HPI: Kelly Lang is a 54 y.o. female here today for a follow up visit.  Patient has past medical history of diabetes mellitus, A.fib, cardiomyopathy, dyslipidemia, and obesity. Patient was admitted on 09/14/15-09/16/15 for a embolic stroke. At that time she reports that she had been off all of her medications since May. Echo revealed a decrease in EF to 20-25% with global hypokinesis. She has since followed up with Cardiology who has placed her on Xarelto for A.fib. Flecainide has not been resumed. Her Lopressor was increased to 37.5 mg BID for rate control and HF which she will start the dose increase tonight.   Patient reports that she sometimes has problems with finding her words and spelling. She also reports peripheral vision decrease and balance issues. She is tearful during exam discussing her challenges to caring for herself.   Patient notes that since being back on her Metformin and Glipizide her sugars are 115-130 fasting and 170 post-prandial. She reports that she is now only eating baked foods and eliminated most carbs. She seems motivated for change.  Allergies  Allergen Reactions  . Codeine Itching and Rash   Past Medical History  Diagnosis Date  . Diabetes mellitus (HCC)     a. hg A1c 10, newly diagnosed (10/26/13)  . Obesity   . H/O: hypothyroidism     a. thyroid biopsy 1996 with synthroid. Stopped taking rx and was loss to follow up b. normal thyroid function 10/20/13  . Arthritis   . Atrial fibrillation with RVR (HCC)     a diagnosed 10/25/2013  . Tachycardia induced cardiomyopathy (HCC)     a. 2/2 Afib with RVR for unknown duration (EF: 40-45%)  . Dyslipidemia   . Hx of cardiovascular stress test     ETT/Lexiscan Myoview (12/2013):  No ischemia; not gated; low risk.  . Chicken pox   . Allergy   . Thyroid disease    Current Outpatient Prescriptions on File Prior to Visit   Medication Sig Dispense Refill  . acetaminophen (TYLENOL) 325 MG tablet Take 2 tablets (650 mg total) by mouth every 6 (six) hours as needed for mild pain or headache.    Marland Kitchen glipiZIDE (GLUCOTROL) 5 MG tablet Take 1 tablet (5 mg total) by mouth daily before breakfast. 30 tablet 0  . lovastatin (MEVACOR) 20 MG tablet Take 1 tablet (20 mg total) by mouth at bedtime. 30 tablet 0  . metFORMIN (GLUCOPHAGE) 500 MG tablet Take 1 tablet (500 mg total) by mouth 2 (two) times daily with a meal. 60 tablet 0  . rivaroxaban (XARELTO) 20 MG TABS tablet Take 1 tablet (20 mg total) by mouth daily with supper. 30 tablet 3   No current facility-administered medications on file prior to visit.   Family History  Problem Relation Age of Onset  . Hypertension Mother   . Hyperlipidemia Mother    Social History   Social History  . Marital Status: Divorced    Spouse Name: N/A  . Number of Children: 0  . Years of Education: 14   Occupational History  . banquet coordinator    Social History Main Topics  . Smoking status: Former Smoker -- 1.00 packs/day for 22 years    Types: Cigarettes    Quit date: 12/01/2010  . Smokeless tobacco: Never Used  . Alcohol Use: Yes     Comment: 10/25/2013 "glass of wine maybe q other  month"  . Drug Use: No  . Sexual Activity: Not Currently   Other Topics Concern  . Not on file   Social History Narrative   Moved to [place] from [place] in 2002. She has only seen one medical doctor in that time period when she had insurance. 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   Fun: shop, sew, decorate   Denies any beliefs effecting healthcare    Review of Systems: Other than what is stated in HPI, all other systems are negative.   Objective:   Filed Vitals:   09/24/15 1001  BP: 137/87  Pulse: 94  Temp: 98 F (36.7 C)  Resp: 16    Physical Exam  Constitutional: She is oriented to person, place, and time.  Neck: Thyromegaly present.   Cardiovascular: Normal rate, regular rhythm and normal heart sounds.   Pulmonary/Chest: Effort normal and breath sounds normal.  Musculoskeletal: She exhibits no edema.  4/5 strength in all extremities   Neurological: She is alert and oriented to person, place, and time.  Skin: Skin is warm and dry.  Psychiatric:  tearful     Lab Results  Component Value Date   WBC 8.0 09/16/2015   HGB 14.4 09/16/2015   HCT 42.6 09/16/2015   MCV 93.8 09/16/2015   PLT 225 09/16/2015   Lab Results  Component Value Date   CREATININE 0.65 09/14/2015   BUN 16 09/14/2015   NA 135 09/14/2015   K 4.5 09/14/2015   CL 103 09/14/2015   CO2 24 09/14/2015    Lab Results  Component Value Date   HGBA1C 10.6* 09/14/2015   Lipid Panel     Component Value Date/Time   CHOL 185 09/14/2015 2045   TRIG 185* 09/14/2015 2045   HDL 31* 09/14/2015 2045   CHOLHDL 6.0 09/14/2015 2045   VLDL 37 09/14/2015 2045   LDLCALC 117* 09/14/2015 2045       Assessment and plan:   Kelly Lang was seen today for hospitalization follow-up.  Diagnoses and all orders for this visit:  Atrial fibrillation  -     rivaroxaban (XARELTO) 20 MG TABS tablet; Take 1 tablet (20 mg total) by mouth daily with supper. Stressed the importance of taking medication daily without skipped doses. Explained the consequence of medical noncompliance may result in repeat stroke or even death.  Embolic infarction (HCC) See above. Keep follow up with Neurology. May benefit from some PT but patient is uninsured. She will apply for hospital discount.  Chronic systolic CHF (congestive heart failure) (HCC) -     lisinopril (PRINIVIL) 10 MG tablet; Take 0.5 tablets (5 mg total) by mouth daily. -     metoprolol tartrate (LOPRESSOR) 25 MG tablet; Take 1.5 tablets (37.5 mg total) by mouth 2 (two) times daily. Continue follow up with Cardiology   Type 2 diabetes mellitus without complication, without long-term current use of insulin (HCC) -      Glucose (CBG) -     glipiZIDE (GLUCOTROL) 5 MG tablet; Take 1 tablet (5 mg total) by mouth daily before breakfast. -     metFORMIN (GLUCOPHAGE) 500 MG tablet; Take 1 tablet (500 mg total) by mouth 2 (two) times daily with a meal. Patients diabetes remains uncontrolled as evidence by hemoglobin a1c >8.  Patient has been non-compliant with medication regimen. Stressed the multiple complications associated with uncontrolled diabetes.  Patient will stay on current medication dose. We will recheck in 3 months  Dyslipidemia -  lovastatin (MEVACOR) 20 MG tablet; Take 1 tablet (20 mg total) by mouth at bedtime. Education provided on proper lifestyle changes in order to lower cholesterol. Patient advised to maintain healthy weight and to keep total fat intake at 25-35% of total calories and carbohydrates 50-60% of total daily calories. Explained how high cholesterol places patient at risk for heart disease. Patient placed on appropriate medication and repeat labs in 6 months   Thyroid mass I do not feel she needs a biopsy at this moment since her TSH and T3 are normal and last ultrasound shows a decrease in mass size which looks benign. I would like patient to see a Endocrinologist for further management. She will apply for hospital discount.    Return in about 3 months (around 12/25/2015) for DM/HTN.       Ambrose Finland, NP-C [facility] [phone] 09/24/2015, 9:52 AM

## 2015-09-24 NOTE — Patient Instructions (Addendum)
We will be checking the following labs today - BMET and CBC   Medication Instructions:    Continue with your current medicines. But  I am increasing the Metoprolol to 37.5 mg twice a day - this is a pill and a half twice a day  I refilled the Lisinopril    Testing/Procedures To Be Arranged:  Cancel the limited echo for next Monday  Follow-Up:   See Dr. Elease Hashimoto in one month    Other Special Instructions:   Call us and let us know if [facility] is going to be able to help with your Xarelto  Call Social Services to see if you can apply for Medicaid.  Call the [facility] office at [phone] if you have any questions, problems or concerns.

## 2015-09-25 LAB — BASIC METABOLIC PANEL
BUN: 17 mg/dL (ref 7–25)
CO2: 23 mmol/L (ref 20–31)
Calcium: 9.2 mg/dL (ref 8.6–10.4)
Chloride: 101 mmol/L (ref 98–110)
Creat: 0.67 mg/dL (ref 0.50–1.05)
Glucose, Bld: 179 mg/dL — ABNORMAL HIGH (ref 65–99)
Potassium: 4.2 mmol/L (ref 3.5–5.3)
Sodium: 136 mmol/L (ref 135–146)

## 2015-09-25 NOTE — Progress Notes (Signed)
Quick Note:  Pt aware of lab results. ______ 

## 2015-10-01 ENCOUNTER — Other Ambulatory Visit (HOSPITAL_COMMUNITY): Payer: Self-pay

## 2015-10-30 ENCOUNTER — Encounter: Payer: Self-pay | Admitting: Cardiovascular Disease

## 2015-10-30 ENCOUNTER — Ambulatory Visit (INDEPENDENT_AMBULATORY_CARE_PROVIDER_SITE_OTHER): Payer: Self-pay | Admitting: Cardiovascular Disease

## 2015-10-30 VITALS — BP 116/80 | HR 65 | Ht 65.5 in | Wt 228.8 lb

## 2015-10-30 DIAGNOSIS — I4819 Other persistent atrial fibrillation: Secondary | ICD-10-CM

## 2015-10-30 DIAGNOSIS — I481 Persistent atrial fibrillation: Secondary | ICD-10-CM

## 2015-10-30 DIAGNOSIS — I5022 Chronic systolic (congestive) heart failure: Secondary | ICD-10-CM

## 2015-10-30 NOTE — Progress Notes (Signed)
Cardiology Office Note   Date:  10/30/2015   ID:  Kelly Lang, DOB 1961/03/30, MRN [id number]  PCP:  Ambrose Finland, NP  Cardiologist:   Vesta Mixer, MD   Chief Complaint  Patient presents with  . Atrial Fibrillation   1. Atrial fibrillation 2. Hypothyroidism 3. Type 2 diabetes mellitus 4. Chronic systolic CHF   History of Present Illness: Kelly Lang is a 54 y.o. female with a hx of T2DM, HL, thyroid disease and atrial fibrillation. She had been on Synthroid for some time after a thyroid bx in 1996. She was admitted 10/2013 with AFib with RVR. Echo (10/26/13): EF 40-45%, normal wall motion, mild LAE. CHADS2-VASc= 3 (LV dysfunction, DM, female). She was placed on coumadin. 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 She was last seen 11/29/2013. She was set up for DCCV after 3-4 weeks of appropriate anticoagulation. DCCV was performed 12/07/2013 but was unsuccessful. She returns for follow up.  She is stable. She is frustrated with her atrial fibrillation. She does get tired more easily. She denies chest pain. She denies significant dyspnea. She is NYHA class II. She denies syncope. She denies orthopnea, PND, edema, palpitations.  Feb. 16, 2015:  She had a cardioversion twice. Converted back to Afib both times. She has had a stress myoview which was negative for ischemia. we will start flecainide today.  February 15, 2014:  Kelly Lang returns for further evaluation of her atrial fib. She has been on Flecainide 100 BID for a month. She started feeling better about 2 weeks ago. She has converted to NSR. 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   Sept. 16, 2015:  Kelly Lang is doing well. 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    February 13, 2015:  Kelly Lang is a 54 y.o. female who presents for follow-up of atrial fibrillation. Is on coumadin . INR is 2.0 this am.   No CP or  dyspnea.   Glucose levels are ok.   HbA1C is a bit high . Was started on Tragenta. Only a little bit of exercise.   Missed 4 days of flecainide due to insurance not paying for it. Staying in NSR  Nov. 29, 2016:  Had a CVA in Oct, 2016.  Had not been on her coumadin and all of her medications.  Echo revealed severe LV dysfunction with EF of 20-25%.   Was started on Xarelto and metoprolol at that time   Has had some some chills and achy feeling for the past couple of days. No cough, no fever, no runny nose.  Cannot tell when she has atrial fib Not Exercising regularly  Works in [employer] of a hotel and also in [employer] room  Glucose levels are ok   Sleeping ok .      Past Medical History  Diagnosis Date  . Diabetes mellitus (HCC)     a. hg A1c 10, newly diagnosed (10/26/13)  . Obesity   . H/O: hypothyroidism     a. thyroid biopsy 1996 with synthroid. Stopped taking rx and was loss to follow up b. normal thyroid function 10/20/13  . Arthritis   . Atrial fibrillation with RVR (HCC)     a diagnosed 10/25/2013  . Tachycardia induced cardiomyopathy (HCC)     a. 2/2 Afib with RVR for unknown duration (EF: 40-45%)  . Dyslipidemia   . Hx of cardiovascular stress test  ETT/Lexiscan Myoview (12/2013):  No ischemia; not gated; low risk.  . Chicken pox   . Allergy   . Thyroid disease     Past Surgical History  Procedure Laterality Date  . Biopsy thyroid      1996  . Tonsillectomy  1971  . Eye muscle surgery Right ~ 1966  . Cardioversion N/A 12/07/2013    Procedure: CARDIOVERSION;  Surgeon: Everette Rank, MD;  Location: [facility] ENDOSCOPY;  Service: Cardiovascular;  Laterality: N/A;     Current Outpatient Prescriptions  Medication Sig Dispense Refill  . acetaminophen (TYLENOL) 325 MG tablet Take 2 tablets (650 mg total) by mouth every 6 (six) hours as needed for mild pain or headache.    Marland Kitchen glipiZIDE (GLUCOTROL) 5 MG tablet Take 1 tablet (5 mg total) by mouth daily before  breakfast. 30 tablet 5  . lisinopril (PRINIVIL) 10 MG tablet Take 0.5 tablets (5 mg total) by mouth daily. 45 tablet 3  . lovastatin (MEVACOR) 20 MG tablet Take 1 tablet (20 mg total) by mouth at bedtime. 30 tablet 5  . metFORMIN (GLUCOPHAGE) 500 MG tablet Take 1 tablet (500 mg total) by mouth 2 (two) times daily with a meal. 60 tablet 5  . metoprolol tartrate (LOPRESSOR) 25 MG tablet Take 1.5 tablets (37.5 mg total) by mouth 2 (two) times daily. 90 tablet 3  . rivaroxaban (XARELTO) 20 MG TABS tablet Take 1 tablet (20 mg total) by mouth daily with supper. 30 tablet 5   No current facility-administered medications for this visit.    Allergies:   Codeine    Social History:  The patient  reports that she quit smoking about 4 years ago. Her smoking use included Cigarettes. She has a 22 pack-year smoking history. She has never used smokeless tobacco. She reports that she drinks alcohol. She reports that she does not use illicit drugs.   Family History:  The patient's family history includes Hyperlipidemia in her mother; Hypertension in her mother.    ROS:  Please see the history of present illness.    Review of Systems: Constitutional:  denies fever, chills, diaphoresis, appetite change and fatigue.  HEENT: denies photophobia, eye pain, redness, hearing loss, ear pain, congestion, sore throat, rhinorrhea, sneezing, neck pain, neck stiffness and tinnitus.  Respiratory: denies SOB, DOE, cough, chest tightness, and wheezing.  Cardiovascular: denies chest pain, palpitations and leg swelling.  Gastrointestinal: denies nausea, vomiting, abdominal pain, diarrhea, constipation, blood in stool.  Genitourinary: denies dysuria, urgency, frequency, hematuria, flank pain and difficulty urinating.  Musculoskeletal: denies  myalgias, back pain, joint swelling, arthralgias and gait problem.   Skin: denies pallor, rash and wound.  Neurological: denies dizziness, seizures, syncope, weakness, light-headedness,  numbness and headaches.   Hematological: denies adenopathy, easy bruising, personal or family bleeding history.  Psychiatric/ Behavioral: denies suicidal ideation, mood changes, confusion, nervousness, sleep disturbance and agitation.       All other systems are reviewed and negative.    PHYSICAL EXAM: VS:  BP 116/80 mmHg  Pulse 65  Ht 5' 5.5" (1.664 m)  Wt 228 lb 12.8 oz (103.783 kg)  BMI 37.48 kg/m2  SpO2 99% , BMI Body mass index is 37.48 kg/(m^2). GEN: Well nourished, well developed, in no acute distress HEENT: normal Neck: no JVD, carotid bruits, or masses Cardiac: Irreg. Irreg. ; no murmurs, rubs, or gallops,no edema  Respiratory:  clear to auscultation bilaterally, normal work of breathing GI: soft, nontender, nondistended, + BS MS: no deformity or atrophy Skin: warm and dry, no  rash Neuro:  Strength and sensation are intact Psych: normal   EKG:  EKG is not ordered today.     Recent Labs: 02/13/2015: ALT 22 09/14/2015: B Natriuretic Peptide 323.7* 09/15/2015: TSH 0.950 09/24/2015: BUN 17; Creat 0.67; Hemoglobin 15.4*; Platelets 255; Potassium 4.2; Sodium 136    Lipid Panel    Component Value Date/Time   CHOL 185 09/14/2015 2045   TRIG 185* 09/14/2015 2045   HDL 31* 09/14/2015 2045   CHOLHDL 6.0 09/14/2015 2045   VLDL 37 09/14/2015 2045   LDLCALC 117* 09/14/2015 2045   LDLDIRECT 114.0 02/13/2015 0750      Wt Readings from Last 3 Encounters:  10/30/15 228 lb 12.8 oz (103.783 kg)  09/24/15 229 lb (103.874 kg)  09/24/15 229 lb 3.2 oz (103.964 kg)      Other studies Reviewed: Additional studies/ records that were reviewed today include: . Review of the above records demonstrates:    ASSESSMENT AND PLAN:  1. Atrial fibrillation- cannot afford the flecainide.  We are using rate control and anticoagulation  Had a CVA in Oct, 2016.  Had not been on her coumadin . Was started on Xarelto and metoprolol at that time  2. Hypothyroidism - followed by her  medical doctor   3. Type 2 diabetes mellitus - has been started on Tragenta .   4. Chronic systolic CHF:  On Lisinopril and metoprolol.   ? May be rate related.  myoview in Jan. 2015 was normal ( no ischemia,  Not gated so no EF available )  Continue meds.   Will reassess in 3 months   Current medicines are reviewed at length with the patient today.  The patient does not have concerns regarding medicines.  The following changes have been made:  no change  Labs/ tests ordered today include:  No orders of the defined types were placed in this encounter.     Disposition:   FU with  Me in 3 months    Signed, Terika Pillard, [name], MD  10/30/2015 8:33 AM    [facility] [address] [place], [place], [place]  [address] Phone: [phone]; Fax: [phone]

## 2015-10-30 NOTE — Patient Instructions (Addendum)
Medication Instructions:  Your physician recommends that you continue on your current medications as directed. Please refer to the Current Medication list given to you today.   Labwork: None Ordered   Testing/Procedures: Your physician has requested that you have an echocardiogram in 3 months a few days prior to your visit with  Dr. Elease Hashimoto. Echocardiography is a painless test that uses sound waves to create images of your heart. It provides your doctor with information about the size and shape of your heart and how well your heart's chambers and valves are working. This procedure takes approximately one hour. There are no restrictions for this procedure.    Follow-Up: Your physician recommends that you schedule a follow-up appointment in: 3 months with Dr. Elease Hashimoto (after the echo)  Your physician has requested that you regularly monitor and record your blood pressure readings at home. Please use the same machine at the same time of day to check your readings and record them and call Eligha Bridegroom, RN to report.   If you need a refill on your cardiac medications before your next appointment, please call your pharmacy.   Thank you for choosing [facility]! Eligha Bridegroom, RN [phone]

## 2015-11-12 ENCOUNTER — Ambulatory Visit: Payer: Self-pay | Admitting: Neurology

## 2015-11-12 ENCOUNTER — Ambulatory Visit: Payer: Self-pay | Attending: Family Medicine

## 2015-11-14 ENCOUNTER — Other Ambulatory Visit: Payer: Self-pay | Admitting: Internal Medicine

## 2015-11-14 DIAGNOSIS — I4891 Unspecified atrial fibrillation: Secondary | ICD-10-CM

## 2015-11-14 MED ORDER — RIVAROXABAN 20 MG PO TABS: 20.0000 mg | ORAL_TABLET | Freq: Every day | ORAL | Status: DC

## 2015-12-06 MED FILL — metFORMIN HCL 500 MG TABS: 500 | 30 days supply | Qty: 60 | Fill #2

## 2015-12-06 MED FILL — LISINOPRIL 10 MG TABLET: 10 | 30 days supply | Qty: 15 | Fill #1

## 2015-12-06 MED FILL — glipiZIDE 5 MG TABS: 5 | 30 days supply | Qty: 30 | Fill #2

## 2015-12-06 MED FILL — METOPROLOL TARTRATE 25 MG T: 25 | 30 days supply | Qty: 90 | Fill #2

## 2015-12-06 MED FILL — LOVASTATIN 20 MG TABLET: 20 | 30 days supply | Qty: 30 | Fill #2

## 2015-12-10 ENCOUNTER — Ambulatory Visit: Payer: Self-pay | Admitting: Internal Medicine

## 2015-12-17 ENCOUNTER — Ambulatory Visit: Payer: Self-pay | Admitting: Internal Medicine

## 2015-12-26 ENCOUNTER — Encounter: Payer: Self-pay | Admitting: Internal Medicine

## 2015-12-26 ENCOUNTER — Ambulatory Visit: Payer: BLUE CROSS/BLUE SHIELD | Attending: Internal Medicine | Admitting: Internal Medicine

## 2015-12-26 VITALS — BP 134/94 | HR 62 | Temp 98.0°F | Resp 16 | Ht 66.0 in | Wt 236.6 lb

## 2015-12-26 DIAGNOSIS — Z2821 Immunization not carried out because of patient refusal: Secondary | ICD-10-CM | POA: Diagnosis not present

## 2015-12-26 DIAGNOSIS — E785 Hyperlipidemia, unspecified: Secondary | ICD-10-CM | POA: Insufficient documentation

## 2015-12-26 DIAGNOSIS — E119 Type 2 diabetes mellitus without complications: Secondary | ICD-10-CM | POA: Insufficient documentation

## 2015-12-26 DIAGNOSIS — Z7984 Long term (current) use of oral hypoglycemic drugs: Secondary | ICD-10-CM | POA: Diagnosis not present

## 2015-12-26 DIAGNOSIS — I10 Essential (primary) hypertension: Secondary | ICD-10-CM | POA: Diagnosis not present

## 2015-12-26 DIAGNOSIS — I429 Cardiomyopathy, unspecified: Secondary | ICD-10-CM | POA: Insufficient documentation

## 2015-12-26 DIAGNOSIS — Z8639 Personal history of other endocrine, nutritional and metabolic disease: Secondary | ICD-10-CM

## 2015-12-26 DIAGNOSIS — R5383 Other fatigue: Secondary | ICD-10-CM | POA: Diagnosis not present

## 2015-12-26 DIAGNOSIS — Z7901 Long term (current) use of anticoagulants: Secondary | ICD-10-CM | POA: Diagnosis not present

## 2015-12-26 DIAGNOSIS — I509 Heart failure, unspecified: Secondary | ICD-10-CM | POA: Insufficient documentation

## 2015-12-26 DIAGNOSIS — Z79899 Other long term (current) drug therapy: Secondary | ICD-10-CM | POA: Diagnosis not present

## 2015-12-26 DIAGNOSIS — I4891 Unspecified atrial fibrillation: Secondary | ICD-10-CM | POA: Diagnosis not present

## 2015-12-26 DIAGNOSIS — E079 Disorder of thyroid, unspecified: Secondary | ICD-10-CM | POA: Insufficient documentation

## 2015-12-26 DIAGNOSIS — E039 Hypothyroidism, unspecified: Secondary | ICD-10-CM | POA: Diagnosis not present

## 2015-12-26 LAB — BASIC METABOLIC PANEL
BUN: 16 mg/dL (ref 7–25)
CALCIUM: 9 mg/dL (ref 8.6–10.4)
CO2: 24 mmol/L (ref 20–31)
Chloride: 99 mmol/L (ref 98–110)
Creat: 0.64 mg/dL (ref 0.50–1.05)
GLUCOSE: 213 mg/dL — AB (ref 65–99)
POTASSIUM: 5.2 mmol/L (ref 3.5–5.3)
SODIUM: 135 mmol/L (ref 135–146)

## 2015-12-26 LAB — TSH: TSH: 0.718 u[IU]/mL (ref 0.350–4.500)

## 2015-12-26 LAB — T4, FREE: Free T4: 0.98 ng/dL (ref 0.80–1.80)

## 2015-12-26 LAB — GLUCOSE, POCT (MANUAL RESULT ENTRY): POC GLUCOSE: 251 mg/dL — AB (ref 70–99)

## 2015-12-26 LAB — POCT GLYCOSYLATED HEMOGLOBIN (HGB A1C): Hemoglobin A1C: 8.3

## 2015-12-26 MED ORDER — METFORMIN HCL 1000 MG PO TABS: 1000.0000 mg | ORAL_TABLET | Freq: Two times a day (BID) | ORAL | Status: DC

## 2015-12-26 NOTE — Progress Notes (Signed)
Patient here for follow up on her diabetes and cholesterol Patient states she does not need refills at this time Patient has declined the flu vaccine

## 2015-12-26 NOTE — Progress Notes (Signed)
Patient ID: Kelly Lang, female   DOB: 1961-06-19, 55 y.o.   MRN: [id number] SUBJECTIVE: 55 y.o. female for follow up of diabetes and HTN. Patient has a past medical history of CHF, cardiomyopathy, A.fib, and HLD. She reports that in the past she was on Synthroid for hypothyroidism but stopped taking the medication several years ago. She notes that she was told that she had benign nodules at [facility].  Diabetic Review of Systems - medication compliance: compliant all of the time, diabetic diet compliance: compliant most of the time, home glucose monitoring: is performed regularly, fasting values range 15-mid 200's, further diabetic ROS: no polyuria or polydipsia, no chest pain, dyspnea or TIA's, no numbness, tingling or pain in extremities, no unusual visual symptoms, no hypoglycemia.  Other symptoms and concerns: She is concerned about fatigue. She states that after work she feels overly tired and worn out.  Current Outpatient Prescriptions  Medication Sig Dispense Refill  . acetaminophen (TYLENOL) 325 MG tablet Take 2 tablets (650 mg total) by mouth every 6 (six) hours as needed for mild pain or headache.    Marland Kitchen glipiZIDE (GLUCOTROL) 5 MG tablet Take 1 tablet (5 mg total) by mouth daily before breakfast. 30 tablet 5  . lisinopril (PRINIVIL) 10 MG tablet Take 0.5 tablets (5 mg total) by mouth daily. 45 tablet 3  . lovastatin (MEVACOR) 20 MG tablet Take 1 tablet (20 mg total) by mouth at bedtime. 30 tablet 5  . metFORMIN (GLUCOPHAGE) 500 MG tablet Take 1 tablet (500 mg total) by mouth 2 (two) times daily with a meal. 60 tablet 5  . metoprolol tartrate (LOPRESSOR) 25 MG tablet Take 1.5 tablets (37.5 mg total) by mouth 2 (two) times daily. 90 tablet 3  . rivaroxaban (XARELTO) 20 MG TABS tablet Take 1 tablet (20 mg total) by mouth daily with supper. 90 tablet 3   No current facility-administered medications for this visit.  Review of System: Other than what is stated in HPI, all other systems are  negative.   OBJECTIVE: Appearance: alert, well appearing, and in no distress, oriented to person, place, and time and overweight. BP 134/94 mmHg  Pulse 62  Temp(Src) 98 F (36.7 C)  Resp 16  Ht  (1.676 m)  Wt 236 lb 9.6 oz (107.321 kg)  BMI 38.21 kg/m2  SpO2 100%  Exam: heart sounds normal rate, regular rhythm, normal S1, S2, no murmurs, rubs, clicks or gallops, no JVD, chest clear, no hepatosplenomegaly, no carotid bruits, feet: warm, good capillary refill and no trophic changes or ulcerative lesions  ASSESSMENT: [name] was seen today for follow-up.  Diagnoses and all orders for this visit:  Type 2 diabetes mellitus without complication, without long-term current use of insulin (HCC) -     Glucose (CBG) -     HgB A1c -     Basic Metabolic Panel -     Microalbumin, urine -     metFORMIN (GLUCOPHAGE) 1000 MG tablet; Take 1 tablet (1,000 mg total) by mouth 2 (two) times daily with a meal. Patient is still not at goal but she has improved significantly. I will double her Metformin dose. I have addressed diet, exercise, and weight loss to help supplement the medication.   History of thyroid disease -     TSH -     T4, Free I will recheck levels today  Other fatigue -     Vitamin D, 25-hydroxy See above  Refused influenza vaccine Explained that annual influenza is recommended per  CDC guidelines and is highly suggested to anyone who has has CHF, COPD, DM or immunocompromised. Benefits of influenza described in detail.   PLAN: See orders for this visit as documented in the electronic medical record. Issues reviewed with her: diabetic diet discussed in detail, written exchange diet given, low cholesterol diet, weight control and daily exercise discussed, foot care discussed and Podiatry visits discussed, annual eye examinations at Ophthalmology discussed and long term diabetic complications discussed.  Return in about 3 months (around 03/25/2016) for Diabetes  [place].  Ambrose Finland, NP 12/26/2015 2:09 PM

## 2015-12-26 NOTE — Patient Instructions (Signed)
Get compression socks for varicose veins

## 2015-12-27 LAB — VITAMIN D 25 HYDROXY (VIT D DEFICIENCY, FRACTURES): VIT D 25 HYDROXY: 14 ng/mL — AB (ref 30–100)

## 2015-12-27 LAB — MICROALBUMIN, URINE: MICROALB UR: 6.2 mg/dL

## 2016-01-01 ENCOUNTER — Other Ambulatory Visit: Payer: Self-pay | Admitting: Internal Medicine

## 2016-01-01 ENCOUNTER — Telehealth: Payer: Self-pay

## 2016-01-01 MED ORDER — VITAMIN D (ERGOCALCIFEROL) 1.25 MG (50000 UNIT) PO CAPS: 50000.0000 [IU] | ORAL_CAPSULE | ORAL | Status: DC

## 2016-01-01 MED FILL — VIT D2 1.25 MG (50,000 UNIT: 1.25 MG | 28 days supply | Qty: 4 | Fill #0

## 2016-01-01 NOTE — Telephone Encounter (Signed)
Spoke with patient this am and she is aware of her lab results [facility]

## 2016-01-01 NOTE — Telephone Encounter (Signed)
-----   Message from Ambrose Finland, NP sent at 01/01/2016  7:35 AM EST ----- Vitamin D is low. Please send drisdol 50,000 IU to take once weekly for 12 weeks. 12 tablets no refills. Protein in urine.She needs to make sure she takes her lisinopril everyday because this helps to protect the kidneys. If she can tolerate a increase on next visit I will make a change at that time. She needs to make sure she gains strict glycemic control over her diabetes to prevent complications in the future

## 2016-01-03 MED FILL — metFORMIN HCL 500 MG TABS: 500 | 30 days supply | Qty: 60 | Fill #3

## 2016-01-03 MED FILL — LOVASTATIN 20 MG TABLET: 20 | 30 days supply | Qty: 30 | Fill #3

## 2016-01-03 MED FILL — METOPROLOL TARTRATE 25 MG T: 25 | 30 days supply | Qty: 90 | Fill #3

## 2016-01-03 MED FILL — glipiZIDE 5 MG TABS: 5 | 30 days supply | Qty: 30 | Fill #3

## 2016-01-03 MED FILL — LISINOPRIL 10 MG TABLET: 10 | 30 days supply | Qty: 15 | Fill #2

## 2016-01-04 MED FILL — XARELTO 20 MG TABLET: 20 | 30 days supply | Qty: 30 | Fill #4

## 2016-01-08 ENCOUNTER — Ambulatory Visit: Payer: Self-pay | Admitting: Neurology

## 2016-01-21 MED FILL — metFORMIN HCL 1000 MG TABS: 1000 | 30 days supply | Qty: 60 | Fill #0

## 2016-01-28 ENCOUNTER — Other Ambulatory Visit (HOSPITAL_COMMUNITY): Payer: Self-pay

## 2016-01-31 ENCOUNTER — Other Ambulatory Visit: Payer: Self-pay | Admitting: Internal Medicine

## 2016-01-31 MED FILL — VIT D2 1.25 MG (50,000 UNIT: 1.25 MG | 28 days supply | Qty: 4 | Fill #1

## 2016-01-31 MED FILL — XARELTO 20 MG TABLET: 20 | 30 days supply | Qty: 30 | Fill #5

## 2016-01-31 MED FILL — LISINOPRIL 10 MG TABLET: 10 | 30 days supply | Qty: 15 | Fill #3

## 2016-01-31 MED FILL — glipiZIDE 5 MG TABS: 5 | 30 days supply | Qty: 30 | Fill #4

## 2016-01-31 MED FILL — LOVASTATIN 20 MG TABLET: 20 | 30 days supply | Qty: 30 | Fill #4

## 2016-02-04 ENCOUNTER — Ambulatory Visit: Payer: Self-pay | Admitting: Cardiovascular Disease

## 2016-02-04 MED FILL — METOPROLOL TARTRATE 25 MG T: 25 | 30 days supply | Qty: 90 | Fill #0

## 2016-02-06 ENCOUNTER — Ambulatory Visit (HOSPITAL_COMMUNITY): Payer: BLUE CROSS/BLUE SHIELD | Attending: Cardiovascular Disease

## 2016-02-06 ENCOUNTER — Other Ambulatory Visit: Payer: Self-pay

## 2016-02-06 DIAGNOSIS — I059 Rheumatic mitral valve disease, unspecified: Secondary | ICD-10-CM | POA: Insufficient documentation

## 2016-02-06 DIAGNOSIS — E669 Obesity, unspecified: Secondary | ICD-10-CM | POA: Diagnosis not present

## 2016-02-06 DIAGNOSIS — I4891 Unspecified atrial fibrillation: Secondary | ICD-10-CM | POA: Diagnosis present

## 2016-02-06 DIAGNOSIS — I517 Cardiomegaly: Secondary | ICD-10-CM | POA: Insufficient documentation

## 2016-02-06 DIAGNOSIS — I481 Persistent atrial fibrillation: Secondary | ICD-10-CM

## 2016-02-06 DIAGNOSIS — Z6838 Body mass index (BMI) 38.0-38.9, adult: Secondary | ICD-10-CM | POA: Diagnosis not present

## 2016-02-06 DIAGNOSIS — I5022 Chronic systolic (congestive) heart failure: Secondary | ICD-10-CM | POA: Insufficient documentation

## 2016-02-06 DIAGNOSIS — I4819 Other persistent atrial fibrillation: Secondary | ICD-10-CM

## 2016-02-08 ENCOUNTER — Ambulatory Visit (INDEPENDENT_AMBULATORY_CARE_PROVIDER_SITE_OTHER): Payer: BLUE CROSS/BLUE SHIELD | Admitting: Cardiovascular Disease

## 2016-02-08 ENCOUNTER — Encounter: Payer: Self-pay | Admitting: Cardiovascular Disease

## 2016-02-08 VITALS — BP 110/86 | HR 76 | Ht 65.5 in | Wt 234.6 lb

## 2016-02-08 DIAGNOSIS — Z79899 Other long term (current) drug therapy: Secondary | ICD-10-CM

## 2016-02-08 DIAGNOSIS — Z5181 Encounter for therapeutic drug level monitoring: Secondary | ICD-10-CM

## 2016-02-08 DIAGNOSIS — I481 Persistent atrial fibrillation: Secondary | ICD-10-CM

## 2016-02-08 DIAGNOSIS — I5022 Chronic systolic (congestive) heart failure: Secondary | ICD-10-CM

## 2016-02-08 DIAGNOSIS — I4819 Other persistent atrial fibrillation: Secondary | ICD-10-CM

## 2016-02-08 DIAGNOSIS — I4891 Unspecified atrial fibrillation: Secondary | ICD-10-CM

## 2016-02-08 DIAGNOSIS — E785 Hyperlipidemia, unspecified: Secondary | ICD-10-CM

## 2016-02-08 MED ORDER — METOPROLOL TARTRATE 25 MG PO TABS: 37.5000 mg | ORAL_TABLET | Freq: Two times a day (BID) | ORAL | Status: DC

## 2016-02-08 MED ORDER — FLECAINIDE ACETATE 100 MG PO TABS: 100.0000 mg | ORAL_TABLET | Freq: Two times a day (BID) | ORAL | Status: DC

## 2016-02-08 MED FILL — FLECAINIDE ACETATE 50 MG TA: 50 | 30 days supply | Qty: 120 | Fill #0

## 2016-02-08 NOTE — Progress Notes (Signed)
Cardiology Office Note   Date:  02/08/2016   ID:  Kelly Lang, DOB January 07, 1961, MRN [id number]  PCP:  Ambrose Finland, NP  Cardiologist:   Vesta Mixer, MD   Chief Complaint  Patient presents with  . Follow-up    afib   1. Paroxysmal Atrial fibrillation 2. Hypothyroidism 3. Type 2 diabetes mellitus 4. Chronic systolic CHF   History of Present Illness: Kelly Lang is a 55 y.o. female with a hx of T2DM, HL, thyroid disease and atrial fibrillation. She had been on Synthroid for some time after a thyroid bx in 1996. She was admitted 10/2013 with AFib with RVR. Echo (10/26/13): EF 40-45%, normal wall motion, mild LAE. CHADS2-VASc= 3 (LV dysfunction, DM, female). She was placed on coumadin. 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 She was last seen 11/29/2013. She was set up for DCCV after 3-4 weeks of appropriate anticoagulation. DCCV was performed 12/07/2013 but was unsuccessful. She returns for follow up.  She is stable. She is frustrated with her atrial fibrillation. She does get tired more easily. She denies chest pain. She denies significant dyspnea. She is NYHA class II. She denies syncope. She denies orthopnea, PND, edema, palpitations.  Feb. 16, 2015:  She had a cardioversion twice. Converted back to Afib both times. She has had a stress myoview which was negative for ischemia. we will start flecainide today.  February 15, 2014:  [name] returns for further evaluation of her atrial fib. She has been on Flecainide 100 BID for a month. She started feeling better about 2 weeks ago. She has converted to NSR. 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   Sept. 16, 2015:  [name] is doing well. 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    February 13, 2015:  Kelly Lang is a 55 y.o. female who presents for follow-up of atrial fibrillation. Is on coumadin . INR is 2.0 this am.   No  CP or dyspnea.   Glucose levels are ok.   HbA1C is a bit high . Was started on Tragenta. Only a little bit of exercise.   Missed 4 days of flecainide due to insurance not paying for it. Staying in NSR  Nov. 29, 2016:  Had a CVA in Oct, 2016.  Had not been on her coumadin and all of her medications.  Echo revealed severe LV dysfunction with EF of 20-25%.   Was started on Xarelto and metoprolol at that time   Has had some some chills and achy feeling for the past couple of days. No cough, no fever, no runny nose.  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     February 08, 2016:  Kelly Lang has been back on her meds.   She had stopped her medications and had developed worsening congestive heart failure. Previous echocardiogram revealed an EF of 25%. She restart her medications and the recent echo 2 days ago reveals recovery of her left ventricular systolic function to an ejection fraction 45% which is her normal.  She's feeling quite a bit better. She has taken flecainide in the past which helped her stay in normal sinus rhythm. She stopped the flecainide due to lack of insurance. She now has [employer] and wants to get back  on flecainide.  Past Medical History  Diagnosis Date  . Diabetes mellitus (HCC)     a. hg A1c 10, newly diagnosed (10/26/13)  . Obesity   . H/O: hypothyroidism     a. thyroid biopsy 1996 with synthroid. Stopped taking rx and was loss to follow up b. normal thyroid function 10/20/13  . Arthritis   . Atrial fibrillation with RVR (HCC)     a diagnosed 10/25/2013  . Tachycardia induced cardiomyopathy (HCC)     a. 2/2 Afib with RVR for unknown duration (EF: 40-45%)  . Dyslipidemia   . Hx of cardiovascular stress test     ETT/Lexiscan Myoview (12/2013):  No ischemia; not gated; low risk.  . Chicken pox   . Allergy   . Thyroid disease       Past Surgical History  Procedure Laterality Date  . Biopsy thyroid      1996  . Tonsillectomy  1971  . Eye muscle surgery Right ~ 1966  . Cardioversion N/A 12/07/2013    Procedure: CARDIOVERSION;  Surgeon: Everette Rank, MD;  Location: [facility] ENDOSCOPY;  Service: Cardiovascular;  Laterality: N/A;     Current Outpatient Prescriptions  Medication Sig Dispense Refill  . acetaminophen (TYLENOL) 325 MG tablet Take 2 tablets (650 mg total) by mouth every 6 (six) hours as needed for mild pain or headache.    Marland Kitchen glipiZIDE (GLUCOTROL) 5 MG tablet Take 1 tablet (5 mg total) by mouth daily before breakfast. 30 tablet 5  . lisinopril (PRINIVIL) 10 MG tablet Take 0.5 tablets (5 mg total) by mouth daily. 45 tablet 3  . lovastatin (MEVACOR) 20 MG tablet Take 1 tablet (20 mg total) by mouth at bedtime. 30 tablet 5  . metFORMIN (GLUCOPHAGE) 1000 MG tablet Take 1 tablet (1,000 mg total) by mouth 2 (two) times daily with a meal. 60 tablet 4  . metoprolol tartrate (LOPRESSOR) 25 MG tablet TAKE 1 & 1/2 TABLETS BY MOUTH 2 TIMES DAILY 90 tablet 2  . rivaroxaban (XARELTO) 20 MG TABS tablet Take 1 tablet (20 mg total) by mouth daily with supper. 90 tablet 3  . Vitamin D, Ergocalciferol, (DRISDOL) 50000 units CAPS capsule Take 1 capsule (50,000 Units total) by mouth every 7 (seven) days. 12 capsule 0   No current facility-administered medications for this visit.    Allergies:   Codeine    Social History:  The patient  reports that she quit smoking about 5 years ago. Her smoking use included Cigarettes. She has a 22 pack-year smoking history. She has never used smokeless tobacco. She reports that she drinks alcohol. She reports that she does not use illicit drugs.   Family History:  The patient's family history includes Hyperlipidemia in her mother; Hypertension in her mother.    ROS:  Please see the history of present illness.    Review of Systems: Constitutional:  denies fever, chills, diaphoresis,  appetite change and fatigue.  HEENT: denies photophobia, eye pain, redness, hearing loss, ear pain, congestion, sore throat, rhinorrhea, sneezing, neck pain, neck stiffness and tinnitus.  Respiratory: denies SOB, DOE, cough, chest tightness, and wheezing.  Cardiovascular: denies chest pain, palpitations and leg swelling.  Gastrointestinal: denies nausea, vomiting, abdominal pain, diarrhea, constipation, blood in stool.  Genitourinary: denies dysuria, urgency, frequency, hematuria, flank pain and difficulty urinating.  Musculoskeletal: denies  myalgias, back pain, joint swelling, arthralgias and gait problem.   Skin: denies pallor, rash and wound.  Neurological: denies dizziness, seizures, syncope, weakness, light-headedness, numbness and headaches.  Hematological: denies adenopathy, easy bruising, personal or family bleeding history.  Psychiatric/ Behavioral: denies suicidal ideation, mood changes, confusion, nervousness, sleep disturbance and agitation.       All other systems are reviewed and negative.    PHYSICAL EXAM: VS:  BP 110/86 mmHg  Pulse 76  Ht 5' 5.5" (1.664 m)  Wt 234 lb 9.6 oz (106.414 kg)  BMI 38.43 kg/m2 , BMI Body mass index is 38.43 kg/(m^2). GEN: Well nourished, well developed, in no acute distress HEENT: normal Neck: no JVD, carotid bruits, or masses Cardiac: Irreg. Irreg. ; no murmurs, rubs, or gallops,no edema  Respiratory:  clear to auscultation bilaterally, normal work of breathing GI: soft, nontender, nondistended, + BS MS: no deformity or atrophy Skin: warm and dry, no rash Neuro:  Strength and sensation are intact Psych: normal   EKG:  EKG is not ordered today.     Recent Labs: 02/13/2015: ALT 22 09/14/2015: B Natriuretic Peptide 323.7* 09/24/2015: Hemoglobin 15.4*; Platelets 255 12/26/2015: BUN 16; Creat 0.64; Potassium 5.2; Sodium 135; TSH 0.718    Lipid Panel    Component Value Date/Time   CHOL 185 09/14/2015 2045   TRIG 185* 09/14/2015  2045   HDL 31* 09/14/2015 2045   CHOLHDL 6.0 09/14/2015 2045   VLDL 37 09/14/2015 2045   LDLCALC 117* 09/14/2015 2045   LDLDIRECT 114.0 02/13/2015 0750      Wt Readings from Last 3 Encounters:  02/08/16 234 lb 9.6 oz (106.414 kg)  12/26/15 236 lb 9.6 oz (107.321 kg)  10/30/15 228 lb 12.8 oz (103.783 kg)      Other studies Reviewed: Additional studies/ records that were reviewed today include: . Review of the above records demonstrates:    ASSESSMENT AND PLAN:  1. Atrial fibrillation- She now has [employer] and wants to restart her flecainide. We will restart her flecainide 100 mg twice a day. It looks like she never had a stress test when she was on flecainide in the past so we will schedule her for a stress test in 3-4 weeks. I'll see her one to 2 weeks after that for reassessment. She may need a cardioversion following that. In the past, flecainide has converted her back to normal sinus rhythm.  2. Hypothyroidism - followed by her medical doctor   3. Type 2 diabetes mellitus - has been started on Tragenta .   4. Chronic systolic CHF:  Her ejection fraction has markedly improved now that she is on her medications and her heart rate is slower. Her ejection fraction is 45% which is her baseline. Continue current medications. We will also be restarting flecainide and hopefully we'll get her back in sinus rhythm which should also help with her congestive heart failure.  5.   Hyperlipidemia: Continue mevacor for now. Encouraged diet and exercise.   Current medicines are reviewed at length with the patient today.  The patient does not have concerns regarding medicines.  The following changes have been made:  no change  Labs/ tests ordered today include:  No orders of the defined types were placed in this encounter.     Disposition:   FU with  Me in 1 months    Signed, Aadan Chenier, [name], MD  02/08/2016 7:58 AM    [facility]  [facility] [address] [place], [place], [place]  [address] Phone: [phone]; Fax: [phone]

## 2016-02-08 NOTE — Patient Instructions (Signed)
Medication Instructions:  RESTART Flecainide 100 mg twice daily   Labwork: None Ordered   Testing/Procedures: Your physician has requested that you have an exercise tolerance test in 3 weeks. For further information please visit [email or web address] Please also follow instruction sheet, as given.    Follow-Up: Your physician recommends that you schedule a follow-up appointment in: approximately 4 weeks (1 week after your exercise stress test) with Dr. Elease Hashimoto   If you need a refill on your cardiac medications before your next appointment, please call your pharmacy.   Thank you for choosing [facility]! Eligha Bridegroom, RN [phone]

## 2016-02-18 MED FILL — metFORMIN HCL 1000 MG TABS: 1000 | 30 days supply | Qty: 60 | Fill #1

## 2016-02-21 ENCOUNTER — Encounter: Payer: Self-pay | Admitting: Internal Medicine

## 2016-02-21 ENCOUNTER — Ambulatory Visit: Payer: BLUE CROSS/BLUE SHIELD | Attending: Internal Medicine | Admitting: Internal Medicine

## 2016-02-21 VITALS — BP 121/83 | HR 81 | Temp 98.1°F | Resp 16 | Ht 65.5 in | Wt 237.0 lb

## 2016-02-21 DIAGNOSIS — Z7984 Long term (current) use of oral hypoglycemic drugs: Secondary | ICD-10-CM | POA: Diagnosis not present

## 2016-02-21 DIAGNOSIS — R1901 Right upper quadrant abdominal swelling, mass and lump: Secondary | ICD-10-CM

## 2016-02-21 DIAGNOSIS — E118 Type 2 diabetes mellitus with unspecified complications: Secondary | ICD-10-CM | POA: Diagnosis not present

## 2016-02-21 DIAGNOSIS — Z7901 Long term (current) use of anticoagulants: Secondary | ICD-10-CM | POA: Insufficient documentation

## 2016-02-21 DIAGNOSIS — Z79899 Other long term (current) drug therapy: Secondary | ICD-10-CM | POA: Insufficient documentation

## 2016-02-21 LAB — GLUCOSE, POCT (MANUAL RESULT ENTRY): POC Glucose: 186 mg/dl — AB (ref 70–99)

## 2016-02-21 NOTE — Progress Notes (Signed)
Patient's here for lump on R side abdomen that's been present x5days.  Patient denies any pain in her abd.  Patient not sure how she developed the lump on the R side of her abdomen.

## 2016-02-21 NOTE — Progress Notes (Signed)
Patient ID: Kelly Lang, female   DOB: [birth year], 55 y.o.   MRN: [id number]  CC: abdominal lump  HPI: Kelly Lang is a 55 y.o. female here today for a follow up visit.  Patient has past medical history of A. Fib, diabetes, and cardiomyopathy. Patient states that 5 days ago she noticed a lump in her right abdomen that is not painful or tender to touch. She denies injury or strain. She states that she only noticed the area because she was looking in the mirror and saw the enlargement. She has no associated GI symptoms. No other complaints today.   Allergies  Allergen Reactions  . Codeine Itching and Rash   Past Medical History  Diagnosis Date  . Diabetes mellitus (HCC)     a. hg A1c 10, newly diagnosed (10/26/13)  . Obesity   . H/O: hypothyroidism     a. thyroid biopsy 1996 with synthroid. Stopped taking rx and was loss to follow up b. normal thyroid function 10/20/13  . Arthritis   . Atrial fibrillation with RVR (HCC)     a diagnosed 10/25/2013  . Tachycardia induced cardiomyopathy (HCC)     a. 2/2 Afib with RVR for unknown duration (EF: 40-45%)  . Dyslipidemia   . Hx of cardiovascular stress test     ETT/Lexiscan Myoview (12/2013):  No ischemia; not gated; low risk.  . Chicken pox   . Allergy   . Thyroid disease    Current Outpatient Prescriptions on File Prior to Visit  Medication Sig Dispense Refill  . acetaminophen (TYLENOL) 325 MG tablet Take 2 tablets (650 mg total) by mouth every 6 (six) hours as needed for mild pain or headache.    . flecainide (TAMBOCOR) 100 MG tablet Take 1 tablet (100 mg total) by mouth 2 (two) times daily. 60 tablet 11  . glipiZIDE (GLUCOTROL) 5 MG tablet Take 1 tablet (5 mg total) by mouth daily before breakfast. 30 tablet 5  . lisinopril (PRINIVIL) 10 MG tablet Take 0.5 tablets (5 mg total) by mouth daily. 45 tablet 3  . lovastatin (MEVACOR) 20 MG tablet Take 1 tablet (20 mg total) by mouth at bedtime. 30 tablet 5  . metFORMIN (GLUCOPHAGE) 1000  MG tablet Take 1 tablet (1,000 mg total) by mouth 2 (two) times daily with a meal. 60 tablet 4  . metoprolol tartrate (LOPRESSOR) 25 MG tablet Take 1.5 tablets (37.5 mg total) by mouth 2 (two) times daily. 90 tablet 11  . rivaroxaban (XARELTO) 20 MG TABS tablet Take 1 tablet (20 mg total) by mouth daily with supper. 90 tablet 3  . Vitamin D, Ergocalciferol, (DRISDOL) 50000 units CAPS capsule Take 1 capsule (50,000 Units total) by mouth every 7 (seven) days. 12 capsule 0   No current facility-administered medications on file prior to visit.   Family History  Problem Relation Age of Onset  . Hypertension Mother   . Hyperlipidemia Mother    Social History   Social History  . Marital Status: Divorced    Spouse Name: N/A  . Number of Children: 0  . Years of Education: 14   Occupational History  . banquet coordinator    Social History Main Topics  . Smoking status: Former Smoker -- 1.00 packs/day for 22 years    Types: Cigarettes    Quit date: 12/01/2010  . Smokeless tobacco: Never Used  . Alcohol Use: Yes     Comment: 10/25/2013 "glass of wine maybe q other month"  . Drug Use: No  .  Sexual Activity: Not Currently   Other Topics Concern  . Not on file   Social History Narrative   Moved to [place] from [place] in 2002. She has only seen one medical doctor in that time period when she had insurance. 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   Fun: shop, sew, decorate   Denies any beliefs effecting healthcare    Review of Systems: Other than what is stated in HPI, all other systems are negative.   Objective:   Filed Vitals:   02/21/16 1432  BP: 121/83  Pulse: 81  Temp: 98.1 F (36.7 C)  Resp: 16    Physical Exam  Constitutional: She is oriented to person, place, and time.  Cardiovascular: Normal rate, regular rhythm and normal heart sounds.   Pulmonary/Chest: Effort normal and breath sounds normal.  Abdominal: Soft. Bowel sounds are  normal. She exhibits mass (large RUQ area most noticed in standing position ). She exhibits no distension. There is no tenderness. No hernia.  Neurological: She is alert and oriented to person, place, and time.  Skin: Skin is warm and dry.  Psychiatric: She has a normal mood and affect.     Lab Results  Component Value Date   WBC 6.5 09/24/2015   HGB 15.4* 09/24/2015   HCT 45.6 09/24/2015   MCV 94.6 09/24/2015   PLT 255 09/24/2015   Lab Results  Component Value Date   CREATININE 0.64 12/26/2015   BUN 16 12/26/2015   NA 135 12/26/2015   K 5.2 12/26/2015   CL 99 12/26/2015   CO2 24 12/26/2015    Lab Results  Component Value Date   HGBA1C 8.30 12/26/2015   Lipid Panel     Component Value Date/Time   CHOL 185 09/14/2015 2045   TRIG 185* 09/14/2015 2045   HDL 31* 09/14/2015 2045   CHOLHDL 6.0 09/14/2015 2045   VLDL 37 09/14/2015 2045   LDLCALC 117* 09/14/2015 2045       Assessment and plan:   Brieann was seen today for mass.  Diagnoses and all orders for this visit:  Abdominal mass, RUQ (right upper quadrant) -     US Abdomen Limited RUQ; Future Unsure of what mass may be but I will send her for ultrasound to start off.   Type 2 diabetes mellitus with complication, without long-term current use of insulin (HCC) -     Glucose (CBG) Stable. Will follow up in 6 weeks for diabetes.      Return in about 6 weeks (around 04/03/2016) for Diabetes Mellitus.   Ambrose Finland, NP-C [facility] [phone] 02/21/2016, 2:36 PM'

## 2016-02-22 ENCOUNTER — Encounter: Payer: Self-pay | Admitting: Clinical

## 2016-02-22 NOTE — Progress Notes (Signed)
Depression screen [facility] 2/9 02/21/2016 12/26/2015 07/03/2014  Decreased Interest 1 0 0  Down, Depressed, Hopeless 0 1 1  PHQ - 2 Score 1 1 1   Altered sleeping 0 - -  Tired, decreased energy 1 - -  Change in appetite 1 - -  Feeling bad or failure about yourself  1 - -  Trouble concentrating 0 - -  Moving slowly or fidgety/restless 1 - -  Suicidal thoughts 0 - -  PHQ-9 Score 5 - -    GAD 7 : Generalized Anxiety Score 02/21/2016 12/26/2015  Nervous, Anxious, on Edge 0 0  Control/stop worrying 0 1  Worry too much - different things 1 1  Trouble relaxing 1 0  Restless 0 0  Easily annoyed or irritable 1 1  Afraid - awful might happen 0 0  Total GAD 7 Score 3 3

## 2016-02-26 ENCOUNTER — Telehealth: Payer: Self-pay

## 2016-02-26 ENCOUNTER — Ambulatory Visit (HOSPITAL_COMMUNITY)
Admission: RE | Admit: 2016-02-26 | Discharge: 2016-02-26 | Disposition: A | Discharge status: Home / Self Care | Payer: BLUE CROSS/BLUE SHIELD | Source: Ambulatory Visit | Attending: Internal Medicine | Admitting: Internal Medicine

## 2016-02-26 DIAGNOSIS — R1901 Right upper quadrant abdominal swelling, mass and lump: Secondary | ICD-10-CM | POA: Diagnosis present

## 2016-02-26 NOTE — Telephone Encounter (Signed)
-----   Message from Ambrose Finland, NP sent at 02/26/2016 12:16 PM EDT ----- Normal ultrasound. Nothing was found which is a good thing. If it continues to grow we can look further into symptoms

## 2016-02-26 NOTE — Telephone Encounter (Signed)
Spoke with patient and she is aware of her results Patient is still having problems with eating  And would like to know what Her next step is

## 2016-02-29 ENCOUNTER — Ambulatory Visit (INDEPENDENT_AMBULATORY_CARE_PROVIDER_SITE_OTHER): Payer: BLUE CROSS/BLUE SHIELD

## 2016-02-29 DIAGNOSIS — I481 Persistent atrial fibrillation: Secondary | ICD-10-CM | POA: Diagnosis not present

## 2016-02-29 DIAGNOSIS — Z5181 Encounter for therapeutic drug level monitoring: Secondary | ICD-10-CM

## 2016-02-29 DIAGNOSIS — Z79899 Other long term (current) drug therapy: Secondary | ICD-10-CM | POA: Diagnosis not present

## 2016-02-29 DIAGNOSIS — I4819 Other persistent atrial fibrillation: Secondary | ICD-10-CM

## 2016-02-29 LAB — EXERCISE TOLERANCE TEST
CHL CUP RESTING HR STRESS: 73 {beats}/min
CHL CUP STRESS STAGE 1 HR: 79 {beats}/min
CHL CUP STRESS STAGE 2 GRADE: 0 %
CHL CUP STRESS STAGE 2 SPEED: 1 mph
CHL CUP STRESS STAGE 3 GRADE: 0.1 %
CHL CUP STRESS STAGE 3 HR: 75 {beats}/min
CHL CUP STRESS STAGE 5 HR: 90 {beats}/min
CHL CUP STRESS STAGE 6 HR: 82 {beats}/min
CHL CUP STRESS STAGE 7 DBP: 65 mmHg
CHL CUP STRESS STAGE 7 GRADE: 0 %
CHL CUP STRESS STAGE 7 HR: 71 {beats}/min
CHL CUP STRESS STAGE 7 SBP: 103 mmHg
CSEPED: 3 min
CSEPEDS: 21 s
CSEPEW: 5 METS
CSEPPHR: 90 {beats}/min
MPHR: 168 {beats}/min
Percent HR: 55 %
Percent of predicted max HR: 53 %
RPE: 16
Stage 1 DBP: 76 mmHg
Stage 1 Grade: 0 %
Stage 1 SBP: 98 mmHg
Stage 1 Speed: 0 mph
Stage 2 HR: 77 {beats}/min
Stage 3 Speed: 1 mph
Stage 4 DBP: 86 mmHg
Stage 4 Grade: 10 %
Stage 4 HR: 83 {beats}/min
Stage 4 SBP: 129 mmHg
Stage 4 Speed: 1.7 mph
Stage 5 Grade: 12 %
Stage 5 Speed: 2.5 mph
Stage 6 Grade: 0 %
Stage 6 Speed: 0 mph
Stage 7 Speed: 0 mph

## 2016-03-04 MED FILL — glipiZIDE 5 MG TABS: 5 | 30 days supply | Qty: 30 | Fill #5

## 2016-03-04 MED FILL — XARELTO 20 MG TABLET: 20 | 30 days supply | Qty: 30 | Fill #6

## 2016-03-04 MED FILL — LISINOPRIL 10 MG TABLET: 10 | 30 days supply | Qty: 15 | Fill #4

## 2016-03-04 MED FILL — LOVASTATIN 20 MG TABLET: 20 | 30 days supply | Qty: 30 | Fill #5

## 2016-03-04 MED FILL — VIT D2 1.25 MG (50,000 UNIT: 1.25 MG | 28 days supply | Qty: 4 | Fill #2

## 2016-03-04 MED FILL — METOPROLOL TARTRATE 25 MG T: 25 | 30 days supply | Qty: 90 | Fill #1

## 2016-03-07 ENCOUNTER — Ambulatory Visit (INDEPENDENT_AMBULATORY_CARE_PROVIDER_SITE_OTHER): Payer: BLUE CROSS/BLUE SHIELD | Admitting: Cardiovascular Disease

## 2016-03-07 ENCOUNTER — Encounter: Payer: Self-pay | Admitting: Cardiovascular Disease

## 2016-03-07 VITALS — BP 118/86 | HR 94 | Ht 65.5 in | Wt 236.0 lb

## 2016-03-07 DIAGNOSIS — Z01812 Encounter for preprocedural laboratory examination: Secondary | ICD-10-CM

## 2016-03-07 DIAGNOSIS — I4891 Unspecified atrial fibrillation: Secondary | ICD-10-CM

## 2016-03-07 DIAGNOSIS — I5022 Chronic systolic (congestive) heart failure: Secondary | ICD-10-CM

## 2016-03-07 LAB — CBC WITH DIFFERENTIAL/PLATELET
BASOS PCT: 1 %
Basophils Absolute: 88 cells/uL (ref 0–200)
EOS PCT: 3 %
Eosinophils Absolute: 264 cells/uL (ref 15–500)
HCT: 43.3 % (ref 35.0–45.0)
Hemoglobin: 14.8 g/dL (ref 11.7–15.5)
LYMPHS PCT: 24 %
Lymphs Abs: 2112 cells/uL (ref 850–3900)
MCH: 33 pg (ref 27.0–33.0)
MCHC: 34.2 g/dL (ref 32.0–36.0)
MCV: 96.7 fL (ref 80.0–100.0)
MONOS PCT: 8 %
MPV: 9.4 fL (ref 7.5–12.5)
Monocytes Absolute: 704 cells/uL (ref 200–950)
NEUTROS ABS: 5632 {cells}/uL (ref 1500–7800)
Neutrophils Relative %: 64 %
PLATELETS: 302 10*3/uL (ref 140–400)
RBC: 4.48 MIL/uL (ref 3.80–5.10)
RDW: 13.6 % (ref 11.0–15.0)
WBC: 8.8 10*3/uL (ref 3.8–10.8)

## 2016-03-07 LAB — BASIC METABOLIC PANEL
BUN: 15 mg/dL (ref 7–25)
CALCIUM: 9.2 mg/dL (ref 8.6–10.4)
CO2: 25 mmol/L (ref 20–31)
Chloride: 103 mmol/L (ref 98–110)
Creat: 0.72 mg/dL (ref 0.50–1.05)
Glucose, Bld: 177 mg/dL — ABNORMAL HIGH (ref 65–99)
Potassium: 4.6 mmol/L (ref 3.5–5.3)
Sodium: 137 mmol/L (ref 135–146)

## 2016-03-07 NOTE — Patient Instructions (Addendum)
Medication Instructions:  Your physician recommends that you continue on your current medications as directed. Please refer to the Current Medication list given to you today.  Labwork: Your physician recommends that you have lab work today. BMET and CBC  Testing/Procedures: Your physician has requested that you have a cardioversion on 03/10/2016 with Dr. Elease Hashimoto.  Follow-Up: Your physician wants you to follow-up in: 3 months with Dr. Elease Hashimoto.    If you need a refill on your cardiac medications before your next appointment, please call your pharmacy.

## 2016-03-07 NOTE — Progress Notes (Signed)
Cardiology Office Note   Date:  03/07/2016   ID:  Kelly Lang, DOB Feb 28, 1961, MRN [id number]  PCP:  Ambrose Finland, NP  Cardiologist:   Vesta Mixer, MD   Chief Complaint  Patient presents with  . Follow-up    Patient here today for follow-up after starting flecainide and stress test.  Patient states that she is still feeling the same, and says she feels winded after small walks.  Medications reviewed.   1. Paroxysmal Atrial fibrillation 2. Hypothyroidism 3. Type 2 diabetes mellitus 4. Chronic systolic CHF   History of Present Illness: Kelly Lang is a 55 y.o. female with a hx of T2DM, HL, thyroid disease and atrial fibrillation. She had been on Synthroid for some time after a thyroid bx in 1996. She was admitted 10/2013 with AFib with RVR. Echo (10/26/13): EF 40-45%, normal wall motion, mild LAE. CHADS2-VASc= 3 (LV dysfunction, DM, female). She was placed on coumadin. 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 She was last seen 11/29/2013. She was set up for DCCV after 3-4 weeks of appropriate anticoagulation. DCCV was performed 12/07/2013 but was unsuccessful. She returns for follow up.  She is stable. She is frustrated with her atrial fibrillation. She does get tired more easily. She denies chest pain. She denies significant dyspnea. She is NYHA class II. She denies syncope. She denies orthopnea, PND, edema, palpitations.  Feb. 16, 2015:  She had a cardioversion twice. Converted back to Afib both times. She has had a stress myoview which was negative for ischemia. we will start flecainide today.  February 15, 2014:  [name] returns for further evaluation of her atrial fib. She has been on Flecainide 100 BID for a month. She started feeling better about 2 weeks ago. She has converted to NSR. 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   Sept. 16, 2015:  Trinh is doing well. 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    February 13, 2015:  [name] is a 55 y.o. female who presents for follow-up of atrial fibrillation. Is on coumadin . INR is 2.0 this am.   No CP or dyspnea.   Glucose levels are ok.   HbA1C is a bit high . Was started on Tragenta. Only a little bit of exercise.   Missed 4 days of flecainide due to insurance not paying for it. Staying in NSR  Nov. 29, 2016:  Had a CVA in Oct, 2016.  Had not been on her coumadin and all of her medications.  Echo revealed severe LV dysfunction with EF of 20-25%.   Was started on Xarelto and metoprolol at that time   Has had some some chills and achy feeling for the past couple of days. No cough, no fever, no runny nose.  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     February 08, 2016:  [name] has been back on her meds.   She had stopped her medications and had developed worsening congestive heart failure. Previous echocardiogram revealed an EF of 25%. She restart her medications and the recent echo 2 days ago reveals recovery of her left ventricular systolic function to an ejection fraction 45% which is her normal.  She's feeling quite a bit better. She has taken flecainide in  the past which helped her stay in normal sinus rhythm. She stopped the flecainide due to lack of insurance. She now has [employer] and wants to get back on flecainide.  March 07, 2016:  Kelly Lang is seen back today for follow-up of her atrial fibrillation. She's been on flecainide 100 mg twice a day.  She's had her stress test for flecainide testing and it revealed no QRS widening. She had an unsuccessful cardioversion Jan. 7, 2015 She cannot tell how much she is in NSR vs. Afib.     Past Medical History  Diagnosis Date  . Diabetes mellitus (HCC)     a. hg A1c 10, newly diagnosed (10/26/13)  .  Obesity   . H/O: hypothyroidism     a. thyroid biopsy 1996 with synthroid. Stopped taking rx and was loss to follow up b. normal thyroid function 10/20/13  . Arthritis   . Atrial fibrillation with RVR (HCC)     a diagnosed 10/25/2013  . Tachycardia induced cardiomyopathy (HCC)     a. 2/2 Afib with RVR for unknown duration (EF: 40-45%)  . Dyslipidemia   . Hx of cardiovascular stress test     ETT/Lexiscan Myoview (12/2013):  No ischemia; not gated; low risk.  . Chicken pox   . Allergy   . Thyroid disease     Past Surgical History  Procedure Laterality Date  . Biopsy thyroid      1996  . Tonsillectomy  1971  . Eye muscle surgery Right ~ 1966  . Cardioversion N/A 12/07/2013    Procedure: CARDIOVERSION;  Surgeon: Everette Rank, MD;  Location: [facility] ENDOSCOPY;  Service: Cardiovascular;  Laterality: N/A;     Current Outpatient Prescriptions  Medication Sig Dispense Refill  . acetaminophen (TYLENOL) 325 MG tablet Take 2 tablets (650 mg total) by mouth every 6 (six) hours as needed for mild pain or headache.    . flecainide (TAMBOCOR) 100 MG tablet Take 1 tablet (100 mg total) by mouth 2 (two) times daily. 60 tablet 11  . glipiZIDE (GLUCOTROL) 5 MG tablet Take 1 tablet (5 mg total) by mouth daily before breakfast. 30 tablet 5  . lisinopril (PRINIVIL) 10 MG tablet Take 0.5 tablets (5 mg total) by mouth daily. 45 tablet 3  . lovastatin (MEVACOR) 20 MG tablet Take 1 tablet (20 mg total) by mouth at bedtime. 30 tablet 5  . metFORMIN (GLUCOPHAGE) 1000 MG tablet Take 1 tablet (1,000 mg total) by mouth 2 (two) times daily with a meal. 60 tablet 4  . metoprolol tartrate (LOPRESSOR) 25 MG tablet Take 1.5 tablets (37.5 mg total) by mouth 2 (two) times daily. 90 tablet 11  . rivaroxaban (XARELTO) 20 MG TABS tablet Take 1 tablet (20 mg total) by mouth daily with supper. 90 tablet 3  . Vitamin D, Ergocalciferol, (DRISDOL) 50000 units CAPS capsule Take 1 capsule (50,000 Units total) by mouth every 7 (seven)  days. 12 capsule 0   No current facility-administered medications for this visit.    Allergies:   Codeine    Social History:  The patient  reports that she quit smoking about 5 years ago. Her smoking use included Cigarettes. She has a 22 pack-year smoking history. She has never used smokeless tobacco. She reports that she drinks alcohol. She reports that she does not use illicit drugs.   Family History:  The patient's family history includes Hyperlipidemia in her mother; Hypertension in her mother.    ROS:  Please see the history of present  illness.    Review of Systems: Constitutional:  denies fever, chills, diaphoresis, appetite change and fatigue.  HEENT: denies photophobia, eye pain, redness, hearing loss, ear pain, congestion, sore throat, rhinorrhea, sneezing, neck pain, neck stiffness and tinnitus.  Respiratory: denies SOB, DOE, cough, chest tightness, and wheezing.  Cardiovascular: denies chest pain, palpitations and leg swelling.  Gastrointestinal: denies nausea, vomiting, abdominal pain, diarrhea, constipation, blood in stool.  Genitourinary: denies dysuria, urgency, frequency, hematuria, flank pain and difficulty urinating.  Musculoskeletal: denies  myalgias, back pain, joint swelling, arthralgias and gait problem.   Skin: denies pallor, rash and wound.  Neurological: denies dizziness, seizures, syncope, weakness, light-headedness, numbness and headaches.   Hematological: denies adenopathy, easy bruising, personal or family bleeding history.  Psychiatric/ Behavioral: denies suicidal ideation, mood changes, confusion, nervousness, sleep disturbance and agitation.       All other systems are reviewed and negative.    PHYSICAL EXAM: VS:  BP 118/86 mmHg  Pulse 94  Ht 5' 5.5" (1.664 m)  Wt 236 lb (107.049 kg)  BMI 38.66 kg/m2 , BMI Body mass index is 38.66 kg/(m^2). GEN: Well nourished, well developed, in no acute distress HEENT: normal Neck: no JVD, carotid bruits,  or masses Cardiac: Irreg. Irreg. ; no murmurs, rubs, or gallops,no edema  Respiratory:  clear to auscultation bilaterally, normal work of breathing GI: soft, nontender, nondistended, + BS MS: no deformity or atrophy Skin: warm and dry, no rash Neuro:  Strength and sensation are intact Psych: normal   EKG:  EKG is ordered today. Atrial fib at rate of 96.   QRS duration = 100 ms.      Recent Labs: 09/14/2015: B Natriuretic Peptide 323.7* 09/24/2015: Hemoglobin 15.4*; Platelets 255 12/26/2015: BUN 16; Creat 0.64; Potassium 5.2; Sodium 135; TSH 0.718    Lipid Panel    Component Value Date/Time   CHOL 185 09/14/2015 2045   TRIG 185* 09/14/2015 2045   HDL 31* 09/14/2015 2045   CHOLHDL 6.0 09/14/2015 2045   VLDL 37 09/14/2015 2045   LDLCALC 117* 09/14/2015 2045   LDLDIRECT 114.0 02/13/2015 0750      Wt Readings from Last 3 Encounters:  03/07/16 236 lb (107.049 kg)  02/21/16 237 lb (107.502 kg)  02/08/16 234 lb 9.6 oz (106.414 kg)      Other studies Reviewed: Additional studies/ records that were reviewed today include: . Review of the above records demonstrates:    ASSESSMENT AND PLAN:  1. Atrial fibrillation- She's back on flecainide 100 mg twice a day. Her QRS duration is 100 ms. We will set her up for a cardioversion next week. We'll draw labs today. She is to continue with the Xarelto.  She seems to be mostly asymptomatic. She does have some shortness of breath with exertion which may be due to the atrial fibrillation but also may be due to her general deconditioning and obesity. Fifth of diversion does not work we will consider A. fib ablation but may also consider anticoagulation and rate control.  I discussed the case with Dr. Ladona Ridgel and he agrees with this plan.  2. Hypothyroidism - followed by her medical doctor   3. Type 2 diabetes mellitus - has been started on Tragenta .   4. Chronic systolic CHF:  Her ejection fraction has markedly improved now that she  is on her medications and her heart rate is slower. Her ejection fraction is 45% which is her baseline. Continue current medications.    5.   Hyperlipidemia: Continue mevacor for now.  Encouraged diet and exercise.   Current medicines are reviewed at length with the patient today.  The patient does not have concerns regarding medicines.  The following changes have been made:  no change  Labs/ tests ordered today include:  No orders of the defined types were placed in this encounter.     Disposition:   FU with  Me in 3 months    Signed, Nahser, [name], MD  03/07/2016 8:08 AM    [facility] [address] [place], [place], [place]  [address] Phone: [phone]; Fax: [phone]

## 2016-03-07 NOTE — Addendum Note (Signed)
Addended by: Vesta Mixer on: 03/07/2016 04:04 PM   Modules accepted: Orders

## 2016-03-10 ENCOUNTER — Ambulatory Visit (HOSPITAL_COMMUNITY): Payer: BLUE CROSS/BLUE SHIELD | Admitting: Anesthesiology

## 2016-03-10 ENCOUNTER — Ambulatory Visit (HOSPITAL_COMMUNITY)
Admission: RE | Admit: 2016-03-10 | Discharge: 2016-03-10 | Disposition: A | Discharge status: Home / Self Care | Payer: BLUE CROSS/BLUE SHIELD | Source: Ambulatory Visit | Attending: Cardiovascular Disease | Admitting: Cardiovascular Disease

## 2016-03-10 ENCOUNTER — Encounter (HOSPITAL_COMMUNITY)
Admission: RE | Disposition: A | Discharge status: Home / Self Care | Payer: Self-pay | Source: Ambulatory Visit | Attending: Cardiovascular Disease

## 2016-03-10 ENCOUNTER — Encounter (HOSPITAL_COMMUNITY): Payer: Self-pay | Admitting: *Deleted

## 2016-03-10 DIAGNOSIS — I5022 Chronic systolic (congestive) heart failure: Secondary | ICD-10-CM | POA: Insufficient documentation

## 2016-03-10 DIAGNOSIS — E119 Type 2 diabetes mellitus without complications: Secondary | ICD-10-CM | POA: Diagnosis not present

## 2016-03-10 DIAGNOSIS — I110 Hypertensive heart disease with heart failure: Secondary | ICD-10-CM | POA: Insufficient documentation

## 2016-03-10 DIAGNOSIS — I4891 Unspecified atrial fibrillation: Secondary | ICD-10-CM | POA: Diagnosis not present

## 2016-03-10 DIAGNOSIS — Z8673 Personal history of transient ischemic attack (TIA), and cerebral infarction without residual deficits: Secondary | ICD-10-CM | POA: Diagnosis not present

## 2016-03-10 DIAGNOSIS — I480 Paroxysmal atrial fibrillation: Secondary | ICD-10-CM | POA: Diagnosis not present

## 2016-03-10 DIAGNOSIS — Z87891 Personal history of nicotine dependence: Secondary | ICD-10-CM | POA: Diagnosis not present

## 2016-03-10 DIAGNOSIS — E785 Hyperlipidemia, unspecified: Secondary | ICD-10-CM | POA: Diagnosis not present

## 2016-03-10 DIAGNOSIS — Z79899 Other long term (current) drug therapy: Secondary | ICD-10-CM | POA: Diagnosis not present

## 2016-03-10 DIAGNOSIS — I429 Cardiomyopathy, unspecified: Secondary | ICD-10-CM | POA: Insufficient documentation

## 2016-03-10 DIAGNOSIS — Z885 Allergy status to narcotic agent status: Secondary | ICD-10-CM | POA: Diagnosis not present

## 2016-03-10 DIAGNOSIS — E039 Hypothyroidism, unspecified: Secondary | ICD-10-CM | POA: Diagnosis not present

## 2016-03-10 DIAGNOSIS — I10 Essential (primary) hypertension: Secondary | ICD-10-CM | POA: Diagnosis not present

## 2016-03-10 DIAGNOSIS — I739 Peripheral vascular disease, unspecified: Secondary | ICD-10-CM | POA: Diagnosis not present

## 2016-03-10 DIAGNOSIS — Z7984 Long term (current) use of oral hypoglycemic drugs: Secondary | ICD-10-CM | POA: Insufficient documentation

## 2016-03-10 DIAGNOSIS — Z7901 Long term (current) use of anticoagulants: Secondary | ICD-10-CM | POA: Insufficient documentation

## 2016-03-10 HISTORY — PX: CARDIOVERSION: SHX1299

## 2016-03-10 LAB — GLUCOSE, CAPILLARY: Glucose-Capillary: 178 mg/dL — ABNORMAL HIGH (ref 65–99)

## 2016-03-10 SURGERY — CARDIOVERSION
Anesthesia: Monitor Anesthesia Care

## 2016-03-10 MED ORDER — LIDOCAINE HCL (CARDIAC) 20 MG/ML IV SOLN
INTRAVENOUS | Status: DC | PRN
  Administered 2016-03-10: 40 mg via INTRAVENOUS

## 2016-03-10 MED ORDER — PROPOFOL 10 MG/ML IV BOLUS
INTRAVENOUS | Status: DC | PRN
  Administered 2016-03-10: 100 mg via INTRAVENOUS

## 2016-03-10 MED ORDER — SODIUM CHLORIDE 0.9 % IV SOLN
INTRAVENOUS | Status: DC
  Administered 2016-03-10: 10:00:00 via INTRAVENOUS

## 2016-03-10 NOTE — H&P (View-Only) (Signed)
Cardiology Office Note   Date:  03/07/2016   ID:  Kelly Lang, DOB Feb 28, 1961, MRN [id number]  PCP:  Ambrose Finland, NP  Cardiologist:   Vesta Mixer, MD   Chief Complaint  Patient presents with  . Follow-up    Patient here today for follow-up after starting flecainide and stress test.  Patient states that Kelly Lang is still feeling the same, and says Kelly Lang feels winded after small walks.  Medications reviewed.   1. Paroxysmal Atrial fibrillation 2. Hypothyroidism 3. Type 2 diabetes mellitus 4. Chronic systolic CHF   History of Present Illness: Kelly Lang is a 55 y.o. female with a hx of T2DM, HL, thyroid disease and atrial fibrillation. Kelly Lang had been on Synthroid for some time after a thyroid bx in 1996. Kelly Lang was admitted 10/2013 with AFib with RVR. Echo (10/26/13): EF 40-45%, normal wall motion, mild LAE. CHADS2-VASc= 3 (LV dysfunction, DM, female). Kelly Lang was placed on coumadin. 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 Kelly Lang was last seen 11/29/2013. Kelly Lang was set up for DCCV after 3-4 weeks of appropriate anticoagulation. DCCV was performed 12/07/2013 but was unsuccessful. Kelly Lang returns for follow up.  Kelly Lang is stable. Kelly Lang is frustrated with her atrial fibrillation. Kelly Lang does get tired more easily. Kelly Lang denies chest pain. Kelly Lang denies significant dyspnea. Kelly Lang is NYHA class II. Kelly Lang denies syncope. Kelly Lang denies orthopnea, PND, edema, palpitations.  Feb. 16, 2015:  Kelly Lang had a cardioversion twice. Converted back to Afib both times. Kelly Lang has had a stress myoview which was negative for ischemia. we will start flecainide today.  February 15, 2014:  [name] returns for further evaluation of her atrial fib. Kelly Lang has been on Flecainide 100 BID for a month. Kelly Lang started feeling better about 2 weeks ago. Kelly Lang has converted to NSR. 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   Sept. 16, 2015:  Trinh is doing well. 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    February 13, 2015:  Kelly Lang is a 55 y.o. female who presents for follow-up of atrial fibrillation. Is on coumadin . INR is 2.0 this am.   No CP or dyspnea.   Glucose levels are ok.   HbA1C is a bit high . Was started on Tragenta. Only a little bit of exercise.   Missed 4 days of flecainide due to insurance not paying for it. Staying in NSR  Nov. 29, 2016:  Had a CVA in Oct, 2016.  Had not been on her coumadin and all of her medications.  Echo revealed severe LV dysfunction with EF of 20-25%.   Was started on Xarelto and metoprolol at that time   Has had some some chills and achy feeling for the past couple of days. No cough, no fever, no runny nose.  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     February 08, 2016:  Kelly Lang has been back on her meds.   Kelly Lang had stopped her medications and had developed worsening congestive heart failure. Previous echocardiogram revealed an EF of 25%. Kelly Lang restart her medications and the recent echo 2 days ago reveals recovery of her left ventricular systolic function to an ejection fraction 45% which is her normal.  Kelly Lang's feeling quite a bit better. Kelly Lang has taken flecainide in  the past which helped her stay in normal sinus rhythm. Kelly Lang stopped the flecainide due to lack of insurance. Kelly Lang now has [employer] and wants to get back on flecainide.  March 07, 2016:  Kelly Lang is seen back today for follow-up of her atrial fibrillation. Kelly Lang's been on flecainide 100 mg twice a day.  Kelly Lang's had her stress test for flecainide testing and it revealed no QRS widening. Kelly Lang had an unsuccessful cardioversion Jan. 7, 2015 Kelly Lang cannot tell how much Kelly Lang is in NSR vs. Afib.     Past Medical History  Diagnosis Date  . Diabetes mellitus (HCC)     a. hg A1c 10, newly diagnosed (10/26/13)  .  Obesity   . H/O: hypothyroidism     a. thyroid biopsy 1996 with synthroid. Stopped taking rx and was loss to follow up b. normal thyroid function 10/20/13  . Arthritis   . Atrial fibrillation with RVR (HCC)     a diagnosed 10/25/2013  . Tachycardia induced cardiomyopathy (HCC)     a. 2/2 Afib with RVR for unknown duration (EF: 40-45%)  . Dyslipidemia   . Hx of cardiovascular stress test     ETT/Lexiscan Myoview (12/2013):  No ischemia; not gated; low risk.  . Chicken pox   . Allergy   . Thyroid disease     Past Surgical History  Procedure Laterality Date  . Biopsy thyroid      1996  . Tonsillectomy  1971  . Eye muscle surgery Right ~ 1966  . Cardioversion N/A 12/07/2013    Procedure: CARDIOVERSION;  Surgeon: Everette Rank, MD;  Location: [facility] ENDOSCOPY;  Service: Cardiovascular;  Laterality: N/A;     Current Outpatient Prescriptions  Medication Sig Dispense Refill  . acetaminophen (TYLENOL) 325 MG tablet Take 2 tablets (650 mg total) by mouth every 6 (six) hours as needed for mild pain or headache.    . flecainide (TAMBOCOR) 100 MG tablet Take 1 tablet (100 mg total) by mouth 2 (two) times daily. 60 tablet 11  . glipiZIDE (GLUCOTROL) 5 MG tablet Take 1 tablet (5 mg total) by mouth daily before breakfast. 30 tablet 5  . lisinopril (PRINIVIL) 10 MG tablet Take 0.5 tablets (5 mg total) by mouth daily. 45 tablet 3  . lovastatin (MEVACOR) 20 MG tablet Take 1 tablet (20 mg total) by mouth at bedtime. 30 tablet 5  . metFORMIN (GLUCOPHAGE) 1000 MG tablet Take 1 tablet (1,000 mg total) by mouth 2 (two) times daily with a meal. 60 tablet 4  . metoprolol tartrate (LOPRESSOR) 25 MG tablet Take 1.5 tablets (37.5 mg total) by mouth 2 (two) times daily. 90 tablet 11  . rivaroxaban (XARELTO) 20 MG TABS tablet Take 1 tablet (20 mg total) by mouth daily with supper. 90 tablet 3  . Vitamin D, Ergocalciferol, (DRISDOL) 50000 units CAPS capsule Take 1 capsule (50,000 Units total) by mouth every 7 (seven)  days. 12 capsule 0   No current facility-administered medications for this visit.    Allergies:   Codeine    Social History:  The patient  reports that Kelly Lang quit smoking about 5 years ago. Her smoking use included Cigarettes. Kelly Lang has a 22 pack-year smoking history. Kelly Lang has never used smokeless tobacco. Kelly Lang reports that Kelly Lang drinks alcohol. Kelly Lang reports that Kelly Lang does not use illicit drugs.   Family History:  The patient's family history includes Hyperlipidemia in her mother; Hypertension in her mother.    ROS:  Please see the history of present  illness.    Review of Systems: Constitutional:  denies fever, chills, diaphoresis, appetite change and fatigue.  HEENT: denies photophobia, eye pain, redness, hearing loss, ear pain, congestion, sore throat, rhinorrhea, sneezing, neck pain, neck stiffness and tinnitus.  Respiratory: denies SOB, DOE, cough, chest tightness, and wheezing.  Cardiovascular: denies chest pain, palpitations and leg swelling.  Gastrointestinal: denies nausea, vomiting, abdominal pain, diarrhea, constipation, blood in stool.  Genitourinary: denies dysuria, urgency, frequency, hematuria, flank pain and difficulty urinating.  Musculoskeletal: denies  myalgias, back pain, joint swelling, arthralgias and gait problem.   Skin: denies pallor, rash and wound.  Neurological: denies dizziness, seizures, syncope, weakness, light-headedness, numbness and headaches.   Hematological: denies adenopathy, easy bruising, personal or family bleeding history.  Psychiatric/ Behavioral: denies suicidal ideation, mood changes, confusion, nervousness, sleep disturbance and agitation.       All other systems are reviewed and negative.    PHYSICAL EXAM: VS:  BP 118/86 mmHg  Pulse 94  Ht 5' 5.5" (1.664 m)  Wt 236 lb (107.049 kg)  BMI 38.66 kg/m2 , BMI Body mass index is 38.66 kg/(m^2). GEN: Well nourished, well developed, in no acute distress HEENT: normal Neck: no JVD, carotid bruits,  or masses Cardiac: Irreg. Irreg. ; no murmurs, rubs, or gallops,no edema  Respiratory:  clear to auscultation bilaterally, normal work of breathing GI: soft, nontender, nondistended, + BS MS: no deformity or atrophy Skin: warm and dry, no rash Neuro:  Strength and sensation are intact Psych: normal   EKG:  EKG is ordered today. Atrial fib at rate of 96.   QRS duration = 100 ms.      Recent Labs: 09/14/2015: B Natriuretic Peptide 323.7* 09/24/2015: Hemoglobin 15.4*; Platelets 255 12/26/2015: BUN 16; Creat 0.64; Potassium 5.2; Sodium 135; TSH 0.718    Lipid Panel    Component Value Date/Time   CHOL 185 09/14/2015 2045   TRIG 185* 09/14/2015 2045   HDL 31* 09/14/2015 2045   CHOLHDL 6.0 09/14/2015 2045   VLDL 37 09/14/2015 2045   LDLCALC 117* 09/14/2015 2045   LDLDIRECT 114.0 02/13/2015 0750      Wt Readings from Last 3 Encounters:  03/07/16 236 lb (107.049 kg)  02/21/16 237 lb (107.502 kg)  02/08/16 234 lb 9.6 oz (106.414 kg)      Other studies Reviewed: Additional studies/ records that were reviewed today include: . Review of the above records demonstrates:    ASSESSMENT AND PLAN:  1. Atrial fibrillation- Kelly Lang's back on flecainide 100 mg twice a day. Her QRS duration is 100 ms. We will set her up for a cardioversion next week. We'll draw labs today. Kelly Lang is to continue with the Xarelto.  Kelly Lang seems to be mostly asymptomatic. Kelly Lang does have some shortness of breath with exertion which may be due to the atrial fibrillation but also may be due to her general deconditioning and obesity. Fifth of diversion does not work we will consider A. fib ablation but may also consider anticoagulation and rate control.  I discussed the case with Dr. Ladona Ridgel and he agrees with this plan.  2. Hypothyroidism - followed by her medical doctor   3. Type 2 diabetes mellitus - has been started on Tragenta .   4. Chronic systolic CHF:  Her ejection fraction has markedly improved now that Kelly Lang  is on her medications and her heart rate is slower. Her ejection fraction is 45% which is her baseline. Continue current medications.    5.   Hyperlipidemia: Continue mevacor for now.  Encouraged diet and exercise.   Current medicines are reviewed at length with the patient today.  The patient does not have concerns regarding medicines.  The following changes have been made:  no change  Labs/ tests ordered today include:  No orders of the defined types were placed in this encounter.     Disposition:   FU with  Me in 3 months    Signed, Ailee Pates, [name], MD  03/07/2016 8:08 AM    [facility] [address] [place], [place], [place]  [address] Phone: [phone]; Fax: [phone]

## 2016-03-10 NOTE — Interval H&P Note (Signed)
History and Physical Interval Note:  03/10/2016 4:55 PM  Kelly Lang  has presented today for surgery, with the diagnosis of AFIB  The various methods of treatment have been discussed with the patient and family. After consideration of risks, benefits and other options for treatment, the patient has consented to  Procedure(s): CARDIOVERSION (N/A) as a surgical intervention .  The patient's history has been reviewed, patient examined, no change in status, stable for surgery.  I have reviewed the patient's chart and labs.  Questions were answered to the patient's satisfaction.     Nahser, [name]

## 2016-03-10 NOTE — CV Procedure (Signed)
    Cardioversion Note  Kelly Lang [id number] January 14, 1961  Procedure: DC Cardioversion Indications: atrial fib   Procedure Details Consent: Obtained Time Out: Verified patient identification, verified procedure, site/side was marked, verified correct patient position, special equipment/implants available, Radiology Safety Procedures followed,  medications/allergies/relevent history reviewed, required imaging and test results available.  Performed  The patient has been on adequate anticoagulation.  The patient received IV Lidocaine 40 mg followed by Propofol 100 mg IV  for sedation.  Synchronous cardioversion was performed at 200  joules.  The cardioversion was successful .    Complications: No apparent complications Patient did tolerate procedure well.   Vesta Mixer, Montez Hageman., MD, [facility] 03/10/2016, 10:42 AM

## 2016-03-10 NOTE — Anesthesia Preprocedure Evaluation (Signed)
Anesthesia Evaluation  Patient identified by MRN, date of birth, ID band Patient awake    Reviewed: Allergy & Precautions, H&P , NPO status , Patient's Chart, lab work & pertinent test results, reviewed documented beta blocker date and time   History of Anesthesia Complications Negative for: history of anesthetic complications  Airway Mallampati: II  TM Distance: >3 FB Neck ROM: Full    Dental  (+) Dental Advisory Given, Poor Dentition   Pulmonary former smoker,    Pulmonary exam normal breath sounds clear to auscultation       Cardiovascular hypertension, Pt. on medications and Pt. on home beta blockers + Peripheral Vascular Disease and +CHF  + dysrhythmias (INR 2.3) Atrial Fibrillation  Rhythm:Irregular Rate:Normal  11/14 ECHO: EF 40-45%   Neuro/Psych    GI/Hepatic negative GI ROS, Neg liver ROS,   Endo/Other  diabetes, Oral Hypoglycemic AgentsMorbid obesity  Renal/GU negative Renal ROS     Musculoskeletal  (+) Arthritis ,   Abdominal (+) + obese,   Peds  Hematology   Anesthesia Other Findings   Reproductive/Obstetrics                             Anesthesia Physical  Anesthesia Plan  ASA: III  Anesthesia Plan: MAC   Post-op Pain Management:    Induction: Intravenous  Airway Management Planned: Mask  Additional Equipment:   Intra-op Plan:   Post-operative Plan:   Informed Consent: I have reviewed the patients History and Physical, chart, labs and discussed the procedure including the risks, benefits and alternatives for the proposed anesthesia with the patient or authorized representative who has indicated his/her understanding and acceptance.   Dental advisory given  Plan Discussed with: Surgeon and CRNA  Anesthesia Plan Comments:         Anesthesia Quick Evaluation

## 2016-03-10 NOTE — Transfer of Care (Signed)
Immediate Anesthesia Transfer of Care Note  Patient: Kelly Lang  Procedure(s) Performed: Procedure(s): CARDIOVERSION (N/A)  Patient Location: PACU  Anesthesia Type:MAC  Level of Consciousness: awake, alert  and patient cooperative  Airway & Oxygen Therapy: Patient Spontanous Breathing  Post-op Assessment: Report given to RN and Post -op Vital signs reviewed and stable  Post vital signs: Reviewed and stable  Last Vitals:  Filed Vitals:   03/10/16 1012  BP: 130/89  Pulse: 70  Resp: 19    Complications: No apparent anesthesia complications

## 2016-03-10 NOTE — Anesthesia Postprocedure Evaluation (Signed)
Anesthesia Post Note  Patient: Kelly Lang  Procedure(s) Performed: Procedure(s) (LRB): CARDIOVERSION (N/A)  Patient location during evaluation: PACU Anesthesia Type: MAC Level of consciousness: awake and alert Pain management: pain level controlled Vital Signs Assessment: post-procedure vital signs reviewed and stable Respiratory status: spontaneous breathing, nonlabored ventilation, respiratory function stable and patient connected to nasal cannula oxygen Cardiovascular status: stable and blood pressure returned to baseline Anesthetic complications: no    Last Vitals:  Filed Vitals:   03/10/16 1047 03/10/16 1050  BP: 116/84 101/62  Pulse:  62  Temp: 36.7 C   Resp: 14 11    Last Pain: There were no vitals filed for this visit.               [name]

## 2016-03-10 NOTE — Discharge Instructions (Signed)
Electrical Cardioversion, Care After °Refer to this sheet in the next few weeks. These instructions provide you with information on caring for yourself after your procedure. Your health care provider may also give you more specific instructions. Your treatment has been planned according to current medical practices, but problems sometimes occur. Call your health care provider if you have any problems or questions after your procedure. °WHAT TO EXPECT AFTER THE PROCEDURE °After your procedure, it is typical to have the following sensations: °· Some redness on the skin where the shocks were delivered. If this is tender, a sunburn lotion or hydrocortisone cream may help. °· Possible return of an abnormal heart rhythm within hours or days after the procedure. °HOME CARE INSTRUCTIONS °· Take medicines only as directed by your health care provider. Be sure you understand how and when to take your medicine. °· Learn how to feel your pulse and check it often. °· Limit your activity for 48 hours after the procedure or as directed by your health care provider. °· Avoid or minimize caffeine and other stimulants as directed by your health care provider. °SEEK MEDICAL CARE IF: °· You feel like your heart is beating too fast or your pulse is not regular. °· You have any questions about your medicines. °· You have bleeding that will not stop. °SEEK IMMEDIATE MEDICAL CARE IF: °· You are dizzy or feel faint. °· It is hard to breathe or you feel short of breath. °· There is a change in discomfort in your chest. °· Your speech is slurred or you have trouble moving an arm or leg on one side of your body. °· You get a serious muscle cramp that does not go away. °· Your fingers or toes turn cold or blue. °  °This information is not intended to replace advice given to you by your health care provider. Make sure you discuss any questions you have with your health care provider. °  °Document Released: 09/07/2013 Document Revised: 12/08/2014  Document Reviewed: 09/07/2013 °[employer] Interactive Patient Education ©2016 [employer] ° °

## 2016-03-12 ENCOUNTER — Encounter (HOSPITAL_COMMUNITY): Payer: Self-pay | Admitting: Cardiovascular Disease

## 2016-03-19 MED FILL — FLECAINIDE ACETATE 50 MG TA: 50 | 30 days supply | Qty: 120 | Fill #1

## 2016-03-19 MED FILL — metFORMIN HCL 1000 MG TABS: 1000 | 30 days supply | Qty: 60 | Fill #2

## 2016-04-03 ENCOUNTER — Other Ambulatory Visit: Payer: Self-pay | Admitting: Internal Medicine

## 2016-04-03 MED FILL — METOPROLOL TARTRATE 25 MG T: 25 | 30 days supply | Qty: 90 | Fill #2

## 2016-04-03 MED FILL — XARELTO 20 MG TABLET: 20 | 10 days supply | Qty: 10 | Fill #7

## 2016-04-03 MED FILL — glipiZIDE 5 MG TABS: 5 | 30 days supply | Qty: 30 | Fill #0

## 2016-04-03 MED FILL — LISINOPRIL 10 MG TABLET: 10 | 30 days supply | Qty: 15 | Fill #5

## 2016-04-03 MED FILL — LOVASTATIN 20 MG TABLET: 20 | 30 days supply | Qty: 30 | Fill #0

## 2016-04-14 ENCOUNTER — Other Ambulatory Visit: Payer: Self-pay | Admitting: Internal Medicine

## 2016-04-14 MED ORDER — VITAMIN D (ERGOCALCIFEROL) 1.25 MG (50000 UNIT) PO CAPS: 50000.0000 [IU] | ORAL_CAPSULE | ORAL | Status: DC

## 2016-04-14 MED FILL — VIT D2 1.25 MG (50,000 UNIT: 1.25 MG | 84 days supply | Qty: 12 | Fill #0

## 2016-04-16 ENCOUNTER — Other Ambulatory Visit: Payer: Self-pay | Admitting: Internal Medicine

## 2016-04-17 MED FILL — XARELTO 20 MG TABLET: 20 | 30 days supply | Qty: 30 | Fill #0

## 2016-04-23 MED FILL — metFORMIN HCL 1000 MG TABS: 1000 | 30 days supply | Qty: 60 | Fill #3

## 2016-04-23 MED FILL — FLECAINIDE ACETATE 50 MG TA: 50 | 30 days supply | Qty: 120 | Fill #2

## 2016-05-05 MED FILL — METOPROLOL TARTRATE 25 MG T: 25 | 30 days supply | Qty: 90 | Fill #0

## 2016-05-05 MED FILL — LISINOPRIL 10 MG TABLET: 10 | 30 days supply | Qty: 15 | Fill #6

## 2016-05-05 MED FILL — glipiZIDE 5 MG TABS: 5 | 30 days supply | Qty: 30 | Fill #1

## 2016-05-14 MED FILL — LOVASTATIN 20 MG TABLET: 20 | 30 days supply | Qty: 30 | Fill #1

## 2016-05-16 ENCOUNTER — Other Ambulatory Visit: Payer: Self-pay | Admitting: Internal Medicine

## 2016-05-16 MED FILL — XARELTO 20 MG TABLET: 20 | 30 days supply | Qty: 30 | Fill #1

## 2016-05-27 MED FILL — metFORMIN HCL 1000 MG TABS: 1000 | 30 days supply | Qty: 60 | Fill #4

## 2016-05-27 MED FILL — FLECAINIDE ACETATE 50 MG TA: 50 | 30 days supply | Qty: 120 | Fill #3

## 2016-05-28 MED FILL — glipiZIDE 5 MG TABS: 5 | 30 days supply | Qty: 30 | Fill #2

## 2016-05-30 ENCOUNTER — Other Ambulatory Visit: Payer: Self-pay | Admitting: Pharmacist

## 2016-05-30 DIAGNOSIS — E119 Type 2 diabetes mellitus without complications: Secondary | ICD-10-CM

## 2016-05-30 MED ORDER — METFORMIN HCL 1000 MG PO TABS: 1000.0000 mg | ORAL_TABLET | Freq: Two times a day (BID) | ORAL | Status: DC

## 2016-06-16 ENCOUNTER — Encounter: Payer: Self-pay | Admitting: Internal Medicine

## 2016-06-16 ENCOUNTER — Ambulatory Visit: Payer: BLUE CROSS/BLUE SHIELD | Attending: Internal Medicine | Admitting: Internal Medicine

## 2016-06-16 ENCOUNTER — Other Ambulatory Visit: Payer: Self-pay

## 2016-06-16 VITALS — BP 104/74 | HR 114 | Temp 97.8°F | Wt 245.4 lb

## 2016-06-16 DIAGNOSIS — E118 Type 2 diabetes mellitus with unspecified complications: Secondary | ICD-10-CM | POA: Diagnosis not present

## 2016-06-16 DIAGNOSIS — Z23 Encounter for immunization: Secondary | ICD-10-CM

## 2016-06-16 DIAGNOSIS — E119 Type 2 diabetes mellitus without complications: Secondary | ICD-10-CM

## 2016-06-16 DIAGNOSIS — I10 Essential (primary) hypertension: Secondary | ICD-10-CM

## 2016-06-16 DIAGNOSIS — I110 Hypertensive heart disease with heart failure: Secondary | ICD-10-CM | POA: Diagnosis not present

## 2016-06-16 DIAGNOSIS — I4892 Unspecified atrial flutter: Secondary | ICD-10-CM | POA: Diagnosis not present

## 2016-06-16 DIAGNOSIS — I4891 Unspecified atrial fibrillation: Secondary | ICD-10-CM | POA: Diagnosis not present

## 2016-06-16 DIAGNOSIS — Z79899 Other long term (current) drug therapy: Secondary | ICD-10-CM | POA: Diagnosis not present

## 2016-06-16 DIAGNOSIS — I5022 Chronic systolic (congestive) heart failure: Secondary | ICD-10-CM | POA: Diagnosis not present

## 2016-06-16 DIAGNOSIS — Z1211 Encounter for screening for malignant neoplasm of colon: Secondary | ICD-10-CM

## 2016-06-16 DIAGNOSIS — Z7901 Long term (current) use of anticoagulants: Secondary | ICD-10-CM | POA: Insufficient documentation

## 2016-06-16 DIAGNOSIS — Z87891 Personal history of nicotine dependence: Secondary | ICD-10-CM | POA: Insufficient documentation

## 2016-06-16 LAB — GLUCOSE, POCT (MANUAL RESULT ENTRY): POC GLUCOSE: 227 mg/dL — AB (ref 70–99)

## 2016-06-16 LAB — POCT GLYCOSYLATED HEMOGLOBIN (HGB A1C): HEMOGLOBIN A1C: 9.2

## 2016-06-16 MED ORDER — PNEUMOCOCCAL VAC POLYVALENT 25 MCG/0.5ML IJ INJ: 0.5000 mL | INJECTION | Freq: Once | INTRAMUSCULAR | Status: AC

## 2016-06-16 MED ORDER — GLIPIZIDE 5 MG PO TABS: 5.0000 mg | ORAL_TABLET | Freq: Two times a day (BID) | ORAL | Status: DC

## 2016-06-16 MED ORDER — VITAMIN D (ERGOCALCIFEROL) 1.25 MG (50000 UNIT) PO CAPS: 50000.0000 [IU] | ORAL_CAPSULE | ORAL | Status: DC

## 2016-06-16 MED ORDER — METFORMIN HCL 1000 MG PO TABS: 1000.0000 mg | ORAL_TABLET | Freq: Two times a day (BID) | ORAL | Status: DC

## 2016-06-16 MED ORDER — INSULIN PEN NEEDLE 32G X 4 MM MISC: 1.0000 | Freq: Every day | Status: DC

## 2016-06-16 MED ORDER — INSULIN GLARGINE 100 UNIT/ML SOLOSTAR PEN: 15.0000 [IU] | PEN_INJECTOR | Freq: Every day | SUBCUTANEOUS | Status: DC

## 2016-06-16 MED FILL — METOPROLOL TARTRATE 25 MG T: 25 | 30 days supply | Qty: 90 | Fill #1

## 2016-06-16 MED FILL — LISINOPRIL 10 MG TABLET: 10 | 30 days supply | Qty: 15 | Fill #7

## 2016-06-16 MED FILL — LOVASTATIN 20 MG TABLET: 20 | 30 days supply | Qty: 30 | Fill #2

## 2016-06-16 MED FILL — XARELTO 20 MG TABLET: 20 | 30 days supply | Qty: 30 | Fill #2

## 2016-06-16 MED FILL — ULTICARE PEN NDL 4MM 32G: 32G X 4 MM | 25 days supply | Qty: 100 | Fill #0

## 2016-06-16 NOTE — Patient Instructions (Addendum)
Kelly Lang - 2 -3 wk app for dm  It was a pleasure seeing you today.  Signup for MyChart ! We can then communicate faster and easier with each other via e-mail.   Diabetes Mellitus and Food It is important for you to manage your blood sugar (glucose) level. Your blood glucose level can be greatly affected by what you eat. Eating healthier foods in the appropriate amounts throughout the day at about the same time each day will help you control your blood glucose level. It can also help slow or prevent worsening of your diabetes mellitus. Healthy eating may even help you improve the level of your blood pressure and reach or maintain a healthy weight.  General recommendations for healthful eating and cooking habits include:  Eating meals and snacks regularly. Avoid going long periods of time without eating to lose weight.  Eating a diet that consists mainly of plant-based foods, such as fruits, vegetables, nuts, legumes, and whole grains.  Using low-heat cooking methods, such as baking, instead of high-heat cooking methods, such as deep frying. Work with your dietitian to make sure you understand how to use the Nutrition Facts information on food labels. HOW CAN FOOD AFFECT ME? Carbohydrates Carbohydrates affect your blood glucose level more than any other type of food. Your dietitian will help you determine how many carbohydrates to eat at each meal and teach you how to count carbohydrates. Counting carbohydrates is important to keep your blood glucose at a healthy level, especially if you are using insulin or taking certain medicines for diabetes mellitus. Alcohol Alcohol can cause sudden decreases in blood glucose (hypoglycemia), especially if you use insulin or take certain medicines for diabetes mellitus. Hypoglycemia can be a life-threatening condition. Symptoms of hypoglycemia (sleepiness, dizziness, and disorientation) are similar to symptoms of having too much alcohol.  If your health  care provider has given you approval to drink alcohol, do so in moderation and use the following guidelines:  Women should not have more than one drink per day, and men should not have more than two drinks per day. One drink is equal to:  12 oz of beer.  5 oz of wine.  1 oz of hard liquor.  Do not drink on an empty stomach.  Keep yourself hydrated. Have water, diet soda, or unsweetened iced tea.  Regular soda, juice, and other mixers might contain a lot of carbohydrates and should be counted. WHAT FOODS ARE NOT RECOMMENDED? As you make food choices, it is important to remember that all foods are not the same. Some foods have fewer nutrients per serving than other foods, even though they might have the same number of calories or carbohydrates. It is difficult to get your body what it needs when you eat foods with fewer nutrients. Examples of foods that you should avoid that are high in calories and carbohydrates but low in nutrients include:  Trans fats (most processed foods list trans fats on the Nutrition Facts label).  Regular soda.  Juice.  Candy.  Sweets, such as cake, pie, doughnuts, and cookies.  Fried foods. WHAT FOODS CAN I EAT? Eat nutrient-rich foods, which will nourish your body and keep you healthy. The food you should eat also will depend on several factors, including:  The calories you need.  The medicines you take.  Your weight.  Your blood glucose level.  Your blood pressure level.  Your cholesterol level. You should eat a variety of foods, including:  Protein.  Lean cuts of meat.  Proteins low in saturated fats, such as fish, egg whites, and beans. Avoid processed meats.  Fruits and vegetables.  Fruits and vegetables that may help control blood glucose levels, such as apples, mangoes, and yams.  Dairy products.  Choose fat-free or low-fat dairy products, such as milk, yogurt, and cheese.  Grains, bread, pasta, and rice.  Choose whole  grain products, such as multigrain bread, whole oats, and brown rice. These foods may help control blood pressure.  Fats.  Foods containing healthful fats, such as nuts, avocado, olive oil, canola oil, and fish. DOES EVERYONE WITH DIABETES MELLITUS HAVE THE SAME MEAL PLAN? Because every person with diabetes mellitus is different, there is not one meal plan that works for everyone. It is very important that you meet with a dietitian who will help you create a meal plan that is just right for you.   This information is not intended to replace advice given to you by your health care provider. Make sure you discuss any questions you have with your health care provider.   Document Released: 08/14/2005 Document Revised: 12/08/2014 Document Reviewed: 10/14/2013 Elsevier Interactive Patient Education 2016 [employer]. - Diabetes and Exercise Exercising regularly is important. It is not just about losing weight. It has many health benefits, such as:  Improving your overall fitness, flexibility, and endurance.  Increasing your bone density.  Helping with weight control.  Decreasing your body fat.  Increasing your muscle strength.  Reducing stress and tension.  Improving your overall health. People with diabetes who exercise gain additional benefits because exercise:  Reduces appetite.  Improves the body's use of blood sugar (glucose).  Helps lower or control blood glucose.  Decreases blood pressure.  Helps control blood lipids (such as cholesterol and triglycerides).  Improves the body's use of the hormone insulin by:  Increasing the body's insulin sensitivity.  Reducing the body's insulin needs.  Decreases the risk for heart disease because exercising:  Lowers cholesterol and triglycerides levels.  Increases the levels of good cholesterol (such as high-density lipoproteins [HDL]) in the body.  Lowers blood glucose levels. YOUR ACTIVITY PLAN  Choose an activity that you  enjoy, and set realistic goals. To exercise safely, you should begin practicing any new physical activity slowly, and gradually increase the intensity of the exercise over time. Your health care provider or diabetes educator can help create an activity plan that works for you. General recommendations include:  Encouraging children to engage in at least 60 minutes of physical activity each day.  Stretching and performing strength training exercises, such as yoga or weight lifting, at least 2 times per week.  Performing a total of at least 150 minutes of moderate-intensity exercise each week, such as brisk walking or water aerobics.  Exercising at least 3 days per week, making sure you allow no more than 2 consecutive days to pass without exercising.  Avoiding long periods of inactivity (90 minutes or more). When you have to spend an extended period of time sitting down, take frequent breaks to walk or stretch. RECOMMENDATIONS FOR EXERCISING WITH TYPE 1 OR TYPE 2 DIABETES   Check your blood glucose before exercising. If blood glucose levels are greater than 240 mg/dL, check for urine ketones. Do not exercise if ketones are present.  Avoid injecting insulin into areas of the body that are going to be exercised. For example, avoid injecting insulin into:  The arms when playing tennis.  The legs when jogging.  Keep a record of:  Food intake before  and after you exercise.  Expected peak times of insulin action.  Blood glucose levels before and after you exercise.  The type and amount of exercise you have done.  Review your records with your health care provider. Your health care provider will help you to develop guidelines for adjusting food intake and insulin amounts before and after exercising.  If you take insulin or oral hypoglycemic agents, watch for signs and symptoms of hypoglycemia. They include:  Dizziness.  Shaking.  Sweating.  Chills.  Confusion.  Drink plenty of  water while you exercise to prevent dehydration or heat stroke. Body water is lost during exercise and must be replaced.  Talk to your health care provider before starting an exercise program to make sure it is safe for you. Remember, almost any type of activity is better than none.   This information is not intended to replace advice given to you by your health care provider. Make sure you discuss any questions you have with your health care provider.   Document Released: 02/07/2004 Document Revised: 04/03/2015 Document Reviewed: 04/26/2013 Elsevier Interactive Patient Education 2016 [employer]  - Calorie Counting for [employer] Calories are energy you get from the things you eat and drink. Your body uses this energy to keep you going throughout the day. The number of calories you eat affects your weight. When you eat more calories than your body needs, your body stores the extra calories as fat. When you eat fewer calories than your body needs, your body burns fat to get the energy it needs. Calorie counting means keeping track of how many calories you eat and drink each day. If you make sure to eat fewer calories than your body needs, you should lose weight. In order for calorie counting to work, you will need to eat the number of calories that are right for you in a day to lose a healthy amount of weight per week. A healthy amount of weight to lose per week is usually 1-2 lb (0.5-0.9 kg). A dietitian can determine how many calories you need in a day and give you suggestions on how to reach your calorie goal.  WHAT IS MY MY PLAN? My goal is to have __________ calories per day.  If I have this many calories per day, I should lose around __________ pounds per week. WHAT DO I NEED TO KNOW ABOUT CALORIE COUNTING? In order to meet your daily calorie goal, you will need to:  Find out how many calories are in each food you would like to eat. Try to do this before you eat.  Decide how much of  the food you can eat.  Write down what you ate and how many calories it had. Doing this is called keeping a food log. WHERE DO I FIND CALORIE INFORMATION? The number of calories in a food can be found on a Nutrition Facts label. Note that all the information on a label is based on a specific serving of the food. If a food does not have a Nutrition Facts label, try to look up the calories online or ask your dietitian for help. HOW DO I DECIDE HOW MUCH TO EAT? To decide how much of the food you can eat, you will need to consider both the number of calories in one serving and the size of one serving. This information can be found on the Nutrition Facts label. If a food does not have a Nutrition Facts label, look up the information online or  ask your dietitian for help. Remember that calories are listed per serving. If you choose to have more than one serving of a food, you will have to multiply the calories per serving by the amount of servings you plan to eat. For example, the label on a package of bread might say that a serving size is 1 slice and that there are 90 calories in a serving. If you eat 1 slice, you will have eaten 90 calories. If you eat 2 slices, you will have eaten 180 calories. HOW DO I KEEP A FOOD LOG? After each meal, record the following information in your food log:  What you ate.  How much of it you ate.  How many calories it had.  Then, add up your calories. Keep your food log near you, such as in a small notebook in your pocket. Another option is to use a mobile app or website. Some programs will calculate calories for you and show you how many calories you have left each time you add an item to the log. WHAT ARE SOME CALORIE COUNTING TIPS?  Use your calories on foods and drinks that will fill you up and not leave you hungry. Some examples of this include foods like nuts and nut butters, vegetables, lean proteins, and high-fiber foods (more than 5 g fiber per  serving).  Eat nutritious foods and avoid empty calories. Empty calories are calories you get from foods or beverages that do not have many nutrients, such as candy and soda. It is better to have a nutritious high-calorie food (such as an avocado) than a food with few nutrients (such as a bag of chips).  Know how many calories are in the foods you eat most often. This way, you do not have to look up how many calories they have each time you eat them.  Look out for foods that may seem like low-calorie foods but are really high-calorie foods, such as baked goods, soda, and fat-free candy.  Pay attention to calories in drinks. Drinks such as sodas, specialty coffee drinks, alcohol, and juices have a lot of calories yet do not fill you up. Choose low-calorie drinks like water and diet drinks.  Focus your calorie counting efforts on higher calorie items. Logging the calories in a garden salad that contains only vegetables is less important than calculating the calories in a milk shake.  Find a way of tracking calories that works for you. Get creative. Most people who are successful find ways to keep track of how much they eat in a day, even if they do not count every calorie. WHAT ARE SOME PORTION CONTROL TIPS?  Know how many calories are in a serving. This will help you know how many servings of a certain food you can have.  Use a measuring cup to measure serving sizes. This is helpful when you start out. With time, you will be able to estimate serving sizes for some foods.  Take some time to put servings of different foods on your favorite plates, bowls, and cups so you know what a serving looks like.  Try not to eat straight from a bag or box. Doing this can lead to overeating. Put the amount you would like to eat in a cup or on a plate to make sure you are eating the right portion.  Use smaller plates, glasses, and bowls to prevent overeating. This is a quick and easy way to practice portion  control. If your plate is smaller,  less food can fit on it.  Try not to multitask while eating, such as watching TV or using your computer. If it is time to eat, sit down at a table and enjoy your food. Doing this will help you to start recognizing when you are full. It will also make you more aware of what and how much you are eating. HOW CAN I CALORIE COUNT WHEN EATING OUT?  Ask for smaller portion sizes or child-sized portions.  Consider sharing an entree and sides instead of getting your own entree.  If you get your own entree, eat only half. Ask for a box at the beginning of your meal and put the rest of your entree in it so you are not tempted to eat it.  Look for the calories on the menu. If calories are listed, choose the lower calorie options.  Choose dishes that include vegetables, fruits, whole grains, low-fat dairy products, and lean protein. Focusing on smart food choices from each of the 5 food groups can help you stay on track at restaurants.  Choose items that are boiled, broiled, grilled, or steamed.  Choose water, milk, unsweetened iced tea, or other drinks without added sugars. If you want an alcoholic beverage, choose a lower calorie option. For example, a regular margarita can have up to 700 calories and a glass of wine has around 150.  Stay away from items that are buttered, battered, fried, or served with cream sauce. Items labeled "crispy" are usually fried, unless stated otherwise.  Ask for dressings, sauces, and syrups on the side. These are usually very high in calories, so do not eat much of them.  Watch out for salads. Many people think salads are a healthy option, but this is often not the case. Many salads come with bacon, fried chicken, lots of cheese, fried chips, and dressing. All of these items have a lot of calories. If you want a salad, choose a garden salad and ask for grilled meats or steak. Ask for the dressing on the side, or ask for olive oil and  vinegar or lemon to use as dressing.  Estimate how many servings of a food you are given. For example, a serving of cooked rice is  cup or about the size of half a tennis ball or one cupcake wrapper. Knowing serving sizes will help you be aware of how much food you are eating at restaurants. The list below tells you how big or small some common portion sizes are based on everyday objects.  1 oz--4 stacked dice.  3 oz--1 deck of cards.  1 tsp--1 dice.  1 Tbsp-- a Ping-Pong ball.  2 Tbsp--1 Ping-Pong ball.   cup--1 tennis ball or 1 cupcake wrapper.  1 cup--1 baseball.   This information is not intended to replace advice given to you by your health care provider. Make sure you discuss any questions you have with your health care provider.   Document Released: 11/17/2005 Document Revised: 12/08/2014 Document Reviewed: 09/22/2013 Elsevier Interactive Patient Education 2016 [employer].  - Exercising to [employer] Exercising can help you to lose weight. In order to lose weight through exercise, you need to do vigorous-intensity exercise. You can tell that you are exercising with vigorous intensity if you are breathing very hard and fast and cannot hold a conversation while exercising. Moderate-intensity exercise helps to maintain your current weight. You can tell that you are exercising at a moderate level if you have a higher heart rate and faster breathing,  but you are still able to hold a conversation. HOW OFTEN SHOULD I EXERCISE? Choose an activity that you enjoy and set realistic goals. Your health care provider can help you to make an activity plan that works for you. Exercise regularly as directed by your health care provider. This may include:  Doing resistance training twice each week, such as:  Push-ups.  Sit-ups.  Lifting weights.  Using resistance bands.  Doing a given intensity of exercise for a given amount of time. Choose from these options:  150 minutes of  moderate-intensity exercise every week.  75 minutes of vigorous-intensity exercise every week.  A mix of moderate-intensity and vigorous-intensity exercise every week. Children, pregnant women, people who are out of shape, people who are overweight, and older adults may need to consult a health care provider for individual recommendations. If you have any sort of medical condition, be sure to consult your health care provider before starting a new exercise program. WHAT ARE SOME ACTIVITIES THAT CAN HELP ME TO LOSE WEIGHT?   Walking at a rate of at least 4.5 miles an hour.  Jogging or running at a rate of 5 miles per hour.  Biking at a rate of at least 10 miles per hour.  Lap swimming.  Roller-skating or in-line skating.  Cross-country skiing.  Vigorous competitive sports, such as football, basketball, and soccer.  Jumping rope.  Aerobic dancing. HOW CAN I BE MORE ACTIVE IN MY DAY-TO-DAY ACTIVITIES?  Use the stairs instead of the elevator.  Take a walk during your lunch break.  If you drive, park your car farther away from work or school.  If you take public transportation, get off one stop early and walk the rest of the way.  Make all of your phone calls while standing up and walking around.  Get up, stretch, and walk around every 30 minutes throughout the day. WHAT GUIDELINES SHOULD I FOLLOW WHILE EXERCISING?  Do not exercise so much that you hurt yourself, feel dizzy, or get very short of breath.  Consult your health care provider prior to starting a new exercise program.  Wear comfortable clothes and shoes with good support.  Drink plenty of water while you exercise to prevent dehydration or heat stroke. Body water is lost during exercise and must be replaced.  Work out until you breathe faster and your heart beats faster.   This information is not intended to replace advice given to you by your health care provider. Make sure you discuss any questions you have  with your health care provider.   Document Released: 12/20/2010 Document Revised: 12/08/2014 Document Reviewed: 04/20/2014 Elsevier Interactive Patient Education [employer]

## 2016-06-16 NOTE — Progress Notes (Signed)
Kelly Lang, is a 55 y.o. female  ZOX:[id number]  [id number]  DOB - 11/22/1961  CC:  Chief Complaint  Patient presents with  . Establish Care    Re establish care  . Follow-up    DM       HPI: Kelly Lang is a 55 y.o. female here today to establish medical care, with OOC dm , afib sp TEE cardioversion 4/10 w/ Dr Elease Hashimoto, htn, morbid obesity.  Last eye exam Oct or Nov 2016, no DM retinopathy per pt, had glasses rx changed.  Denies any abd c/o, normal bms, that "lump" on her right abd remains present.  She states despite taking her dm pills, and watching carbs, her CBGs at home still range 180-300s in am. She has started swimming in the last  3 wks or so in above grown pool her family bought.  Does not recall ever getting pneumococcal vaccine, not certain when last got Tdap/tetanus shot.  Patient has No headache, No chest pain, palpitation,  No abdominal pain - No Nausea, No new weakness tingling or numbness, No Cough - SOB.    Review of Systems: Per HPI, o/w all systems reviewed and negative.   Allergies  Allergen Reactions  . Codeine Itching and Rash   Past Medical History  Diagnosis Date  . Diabetes mellitus (HCC)     a. hg A1c 10, newly diagnosed (10/26/13)  . Obesity   . H/O: hypothyroidism     a. thyroid biopsy 1996 with synthroid. Stopped taking rx and was loss to follow up b. normal thyroid function 10/20/13  . Arthritis   . Atrial fibrillation with RVR (HCC)     a diagnosed 10/25/2013  . Tachycardia induced cardiomyopathy (HCC)     a. 2/2 Afib with RVR for unknown duration (EF: 40-45%)  . Dyslipidemia   . Hx of cardiovascular stress test     ETT/Lexiscan Myoview (12/2013):  No ischemia; not gated; low risk.  . Chicken pox   . Allergy   . Thyroid disease    Current Outpatient Prescriptions on File Prior to Visit  Medication Sig Dispense Refill  . acetaminophen (TYLENOL) 325 MG tablet Take 2 tablets (650 mg total) by mouth every 6 (six) hours  as needed for mild pain or headache.    Marland Kitchen acetaminophen (TYLENOL) 500 MG tablet Take 1,000 mg by mouth at bedtime as needed (pain).    . flecainide (TAMBOCOR) 100 MG tablet Take 1 tablet (100 mg total) by mouth 2 (two) times daily. 60 tablet 11  . glipiZIDE (GLUCOTROL) 5 MG tablet TAKE 1 TABLET BY MOUTH DAILY BEFORE BREAKFAST. 30 tablet 2  . lisinopril (PRINIVIL) 10 MG tablet Take 0.5 tablets (5 mg total) by mouth daily. 45 tablet 3  . lovastatin (MEVACOR) 20 MG tablet TAKE 1 TABLET BY MOUTH AT BEDTIME. 30 tablet 2  . metFORMIN (GLUCOPHAGE) 1000 MG tablet Take 1 tablet (1,000 mg total) by mouth 2 (two) times daily with a meal. Patient needs office visit 60 tablet 0  . metoprolol tartrate (LOPRESSOR) 25 MG tablet Take 1.5 tablets (37.5 mg total) by mouth 2 (two) times daily. 90 tablet 11  . XARELTO 20 MG TABS tablet TAKE 1 TABLET BY MOUTH DAILY WITH SUPPER. 30 tablet 5   No current facility-administered medications on file prior to visit.   Family History  Problem Relation Age of Onset  . Hypertension Mother   . Hyperlipidemia Mother    Social History   Social History  . Marital Status:  Divorced    Spouse Name: N/A  . Number of Children: 0  . Years of Education: 14   Occupational History  . banquet coordinator    Social History Main Topics  . Smoking status: Former Smoker -- 1.00 packs/day for 22 years    Types: Cigarettes    Quit date: 12/01/2010  . Smokeless tobacco: Never Used  . Alcohol Use: Yes     Comment: 10/25/2013 "glass of wine maybe q other month"  . Drug Use: No  . Sexual Activity: Not Currently   Other Topics Concern  . Not on file   Social History Narrative   Moved to [place] from [place] in 2002. She has only seen one medical doctor in that time period when she had insurance. 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   Fun: shop, sew, decorate   Denies any beliefs effecting healthcare    Objective:   Filed Vitals:   06/16/16  1048  BP: 104/74  Pulse: 114  Temp: 97.8 F (36.6 C)    Filed Weights   06/16/16 1048  Weight: 245 lb 6.4 oz (111.313 kg)    BP Readings from Last 3 Encounters:  06/16/16 104/74  03/10/16 112/59  03/07/16 118/86    Physical Exam: Constitutional: Patient appears well-developed and well-nourished. No distress. AAOx3, morbid obese, pleasant HENT: Normocephalic, atraumatic, External right and left ear normal. Oropharynx is clear and moist.  Eyes: Conjunctivae and EOM are normal. PERRL, no scleral icterus. Neck: Normal ROM. Neck supple. No JVD.  CVS: irreg irreg , S1/S2 + tachycardic, no murmurs, no gallops, no carotid bruit.  Pulmonary: Effort and breath sounds normal, no stridor, rhonchi, wheezes, rales.  Abdominal: +bs, soft, nttp. visually can see right abd fat pad slightly larger than left side when standing, but when lying flat, no palpable abd mass, no hepatosplenomegatly on ascultation. no tenderness, rebound or guarding.  Musculoskeletal: Normal range of motion. No edema and no tenderness.  LE: bilat/ no c/c/e, pulses 2+ bilateral. Neuro: Alert.  muscle tone coordination wnl. No cranial nerve deficit grossly. Skin: Skin is warm and dry. No rash noted. Not diaphoretic. No erythema. No pallor. Psychiatric: Normal mood and affect. Behavior, judgment, thought content normal.  Lab Results  Component Value Date   WBC 8.8 03/07/2016   HGB 14.8 03/07/2016   HCT 43.3 03/07/2016   MCV 96.7 03/07/2016   PLT 302 03/07/2016   Lab Results  Component Value Date   CREATININE 0.72 03/07/2016   BUN 15 03/07/2016   NA 137 03/07/2016   K 4.6 03/07/2016   CL 103 03/07/2016   CO2 25 03/07/2016    Lab Results  Component Value Date   HGBA1C 9.2 06/16/2016   Lipid Panel     Component Value Date/Time   CHOL 185 09/14/2015 2045   TRIG 185* 09/14/2015 2045   HDL 31* 09/14/2015 2045   CHOLHDL 6.0 09/14/2015 2045   VLDL 37 09/14/2015 2045   LDLCALC 117* 09/14/2015 2045         Depression screen PHQ 2/9 06/16/2016 02/21/2016 12/26/2015 07/03/2014  Decreased Interest 0 1 0 0  Down, Depressed, Hopeless 0 0 1 1  PHQ - 2 Score 0 1 1 1   Altered sleeping - 0 - -  Tired, decreased energy - 1 - -  Change in appetite - 1 - -  Feeling bad or failure about yourself  - 1 - -  Trouble concentrating - 0 - -  Moving slowly or  fidgety/restless - 1 - -  Suicidal thoughts - 0 - -  PHQ-9 Score - 5 - -   ekg 06/16/16 Aflutter 3:1, rate 74bpm, asx at time.  Assessment and plan:   1. Type 2 diabetes mellitus with complication, without long-term current use of insulin (HCC) Ooc,  - dw pt low carb diet, increase exercise - renewed metformin 1000mg  po bid - chg glipizide to 5 bid - added lantus 15 units qhs, I showed Pt how to give injection w/ our test solarstar pen. - POCT glucose (manual entry) 227 - POCT glycosylated hemoglobin (Hb A1C) 9.2 - BASIC METABOLIC PANEL WITH GFR  2. Atrial fibrillation /aflutter - on xarelto 20 qd Denies sob/palpiations,  - on flecainide 100bid, lopressor 37.5bid, lisinopril 5 qday. - EKG 12-Lead  - aflutter 3:1 ratio, ratio 74  - sent note to Dr Elease Hashimoto, needs f/u w/ EP cards.  3. Essential hypertension, benign Controlled, same bp meds.  4. Chronic systolic CHF (congestive heart failure) (HCC) euvolemic today  5. Colon cancer screening - Ambulatory referral to Gastroenterology  6. Health maintenance - had eye exam oct -nov 2016, no retinopathy per pt. - needs pap, make appt w/ me when can - tdap/pneumocccal 23 valiant vaccines today. - no longer smoking!  Return in about 3 weeks (around 07/07/2016) for pap.  The patient was given clear instructions to go to ER or return to medical center if symptoms don't improve, worsen or new problems develop. The patient verbalized understanding. The patient was told to call to get lab results if they haven't heard anything in the next week.    This note has been created with Engineer, agricultural. Any transcriptional errors are unintentional.   Pete Glatter, MD, MBA/MHA [facility] [place], [place] [phone]   06/16/2016, 11:18 AM

## 2016-06-17 LAB — BASIC METABOLIC PANEL WITH GFR
BUN: 22 mg/dL (ref 7–25)
CALCIUM: 9.2 mg/dL (ref 8.6–10.4)
CHLORIDE: 101 mmol/L (ref 98–110)
CO2: 21 mmol/L (ref 20–31)
CREATININE: 0.9 mg/dL (ref 0.50–1.05)
GFR, Est African American: 84 mL/min (ref >=60)
GFR, Est Non African American: 73 mL/min (ref >=60)
Glucose, Bld: 222 mg/dL — ABNORMAL HIGH (ref 65–99)
Potassium: 5.2 mmol/L (ref 3.5–5.3)
Sodium: 134 mmol/L — ABNORMAL LOW (ref 135–146)

## 2016-06-23 MED FILL — VIT D2 1.25 MG (50,000 UNIT: 1.25 MG | 84 days supply | Qty: 12 | Fill #0

## 2016-06-23 MED FILL — glipiZIDE 5 MG TABS: 5 | 30 days supply | Qty: 60 | Fill #0

## 2016-06-25 ENCOUNTER — Other Ambulatory Visit: Payer: Self-pay | Admitting: Internal Medicine

## 2016-07-14 ENCOUNTER — Other Ambulatory Visit: Payer: Self-pay | Admitting: Internal Medicine

## 2016-07-14 MED FILL — XARELTO 20 MG TABLET: 20 | 30 days supply | Qty: 30 | Fill #3

## 2016-07-14 MED FILL — LISINOPRIL 10 MG TABLET: 10 | 30 days supply | Qty: 15 | Fill #8

## 2016-07-14 MED FILL — METOPROLOL TARTRATE 25 MG T: 25 | 30 days supply | Qty: 90 | Fill #2

## 2016-07-14 MED FILL — LOVASTATIN 20 MG TABLET: 20 | 30 days supply | Qty: 30 | Fill #0

## 2016-07-17 MED FILL — glipiZIDE 5 MG TABS: 5 | 30 days supply | Qty: 60 | Fill #1

## 2016-07-17 MED FILL — LANTUS SOLOSTAR 100 UNITS/M: 100 | 40 days supply | Qty: 6 | Fill #0

## 2016-08-15 MED FILL — METOPROLOL TARTRATE 25 MG T: 25 | 30 days supply | Qty: 90 | Fill #3

## 2016-08-15 MED FILL — LOVASTATIN 20 MG TABLET: 20 | 30 days supply | Qty: 30 | Fill #1

## 2016-08-15 MED FILL — XARELTO 20 MG TABLET: 20 | 30 days supply | Qty: 30 | Fill #4

## 2016-08-15 MED FILL — LISINOPRIL 10 MG TABLET: 10 | 30 days supply | Qty: 15 | Fill #9

## 2016-08-21 ENCOUNTER — Encounter (HOSPITAL_COMMUNITY): Payer: Self-pay | Admitting: Family Medicine

## 2016-08-21 ENCOUNTER — Ambulatory Visit (HOSPITAL_COMMUNITY)
Admission: EM | Admit: 2016-08-21 | Discharge: 2016-08-21 | Disposition: A | Discharge status: Home / Self Care | Payer: BLUE CROSS/BLUE SHIELD | Attending: Internal Medicine | Admitting: Internal Medicine

## 2016-08-21 DIAGNOSIS — R42 Dizziness and giddiness: Secondary | ICD-10-CM

## 2016-08-21 DIAGNOSIS — H81391 Other peripheral vertigo, right ear: Secondary | ICD-10-CM

## 2016-08-21 LAB — GLUCOSE, CAPILLARY: GLUCOSE-CAPILLARY: 214 mg/dL — AB (ref 65–99)

## 2016-08-21 NOTE — ED Triage Notes (Signed)
Pt here for some dizziness and not feeling well since last night. sts that she had an episode last night where she bent over and became dizzy and ears were ringing. sts then she layed in bed and had 2 episodes of dizziness and went to bed. sts her blood sugar was slightly elevated. sts she just doesn't feel right. No other deficits noted.

## 2016-08-21 NOTE — ED Provider Notes (Signed)
CSN: [id number]     Arrival date & time 08/21/16  1522 History   First MD Initiated Contact with Patient 08/21/16 1542     Chief Complaint  Patient presents with  . Dizziness   (Consider location/radiation/quality/duration/timing/severity/associated sxs/prior Treatment) HPI  [age] year old female patient states that she arrived home last night leaned over to pick something up very quickly had an episode of dizziness that lasted about 30 seconds and then went away. She then did the same thing today had an episode of dizziness but was scared her was there was a bright light in her right eye that lasted for just a couple of seconds and then resolved. She has had no nausea but states that after that she just did not feel well. She states similar symptoms a couple years ago with visual disturbances and she had a stroke. Have a history of diabetes and atrial fibrillation. She states her sugars have been running high lately because she has been eating more starch. She is trying to be better. Patient was able to drive to the Center today without problems.    Past Medical History:  Diagnosis Date  . Allergy   . Arthritis   . Atrial fibrillation with RVR (HCC)    a diagnosed 10/25/2013  . Chicken pox   . Diabetes mellitus (HCC)    a. hg A1c 10, newly diagnosed (10/26/13)  . Dyslipidemia   . H/O: hypothyroidism    a. thyroid biopsy 1996 with synthroid. Stopped taking rx and was loss to follow up b. normal thyroid function 10/20/13  . Hx of cardiovascular stress test    ETT/Lexiscan Myoview (12/2013):  No ischemia; not gated; low risk.  . Obesity   . 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 2/2 Afib with RVR for unknown duration (EF: 40-45%)  . Thyroid disease    Past Surgical History:  Procedure Laterality Date  . BIOPSY THYROID     1996  . CARDIOVERSION N/A 12/07/2013   Procedure: CARDIOVERSION;  Surgeon: Everette RankJay Varanasi, MD;  [facility];  Service: Cardiovascular;  Laterality: N/A;   . CARDIOVERSION N/A 03/10/2016   Procedure: CARDIOVERSION;  Surgeon: Vesta MixerPhilip J Nahser, MD;  [facility];  Service: Cardiovascular;  Laterality: N/A;  . EYE MUSCLE SURGERY Right ~ 1966  . TONSILLECTOMY  1971   Family History  Problem Relation Age of Onset  . Hypertension Mother   . Hyperlipidemia Mother    Social History  Substance Use Topics  . Smoking status: Former Smoker    Packs/day: 1.00    Years: 22.00    Types: Cigarettes    Quit date: 12/01/2010  . Smokeless tobacco: Never Used  . Alcohol use Yes     Comment: 10/25/2013 "glass of wine maybe q other month"   OB History    No data available     Review of Systems  Denies: HEADACHE, NAUSEA, ABDOMINAL PAIN, CHEST PAIN, CONGESTION, DYSURIA, SHORTNESS OF BREATH  Allergies  Codeine  Home Medications   Prior to Admission medications   Medication Sig Start Date End Date Taking? Authorizing Provider  acetaminophen (TYLENOL) 325 MG tablet Take 2 tablets (650 mg total) by mouth every 6 (six) hours as needed for mild pain or headache. 09/16/15   Calvert CantorSaima Rizwan, MD  acetaminophen (TYLENOL) 500 MG tablet Take 1,000 mg by mouth at bedtime as needed (pain).    Historical Provider, MD  flecainide (TAMBOCOR) 100 MG tablet Take 1 tablet (100 mg total) by mouth 2 (two) times  daily. 02/08/16   Vesta Mixer, MD  glipiZIDE (GLUCOTROL) 5 MG tablet Take 1 tablet (5 mg total) by mouth 2 (two) times daily before a meal. 06/16/16   Pete Glatter, MD  Insulin Glargine (LANTUS SOLOSTAR) 100 UNIT/ML Solostar Pen Inject 15 Units into the skin daily at 10 pm. 06/16/16   Pete Glatter, MD  Insulin Pen Needle (ULTICARE MICRO PEN NEEDLES) 32G X 4 MM MISC 1 applicator by Does not apply route at bedtime. 06/16/16   Pete Glatter, MD  lisinopril (PRINIVIL) 10 MG tablet Take 0.5 tablets (5 mg total) by mouth daily. 09/24/15   Ambrose Finland, NP  lovastatin (MEVACOR) 20 MG tablet TAKE 1 TABLET BY MOUTH AT BEDTIME. 07/14/16   Pete Glatter, MD   metFORMIN (GLUCOPHAGE) 1000 MG tablet Take 1 tablet (1,000 mg total) by mouth 2 (two) times daily with a meal. Patient needs office visit 06/16/16   Pete Glatter, MD  metoprolol tartrate (LOPRESSOR) 25 MG tablet Take 1.5 tablets (37.5 mg total) by mouth 2 (two) times daily. 02/08/16   Vesta Mixer, MD  Vitamin D, Ergocalciferol, (DRISDOL) 50000 units CAPS capsule Take 1 capsule (50,000 Units total) by mouth every 7 (seven) days. 06/16/16   Dawn Marland Mcalpine, MD  XARELTO 20 MG TABS tablet TAKE 1 TABLET BY MOUTH DAILY WITH SUPPER. 04/16/16   Quentin Angst, MD   Meds Ordered and Administered this Visit  Medications - No data to display  BP 93/71   Pulse 64   Temp 98.4 F (36.9 C)   Resp 18   SpO2 97%  No data found.   Physical Exam NURSES NOTES AND VITAL SIGNS REVIEWED. CONSTITUTIONAL: Well developed, well nourished, no acute distress HEENT: normocephalic, atraumatic EYES: Conjunctiva normal NECK:normal ROM, supple, no adenopathy PULMONARY:No respiratory distress, normal effort ABDOMINAL: Soft, ND, NT BS+, No CVAT MUSCULOSKELETAL: Normal ROM of all extremities,  SKIN: warm and dry without rash PSYCHIATRIC: Mood and affect, behavior are normal  Urgent Care Course   Clinical Course  Recheck of BP prior to discharge 146/78  Procedures (including critical care time)  Labs Review Labs Reviewed  GLUCOSE, CAPILLARY - Abnormal; Notable for the following:       Result Value   Glucose-Capillary 214 (*)    All other components within normal limits    Imaging Review No results found.   Visual Acuity Review  Right Eye Distance:   Left Eye Distance:   Bilateral Distance:    Right Eye Near:   Left Eye Near:    Bilateral Near:         MDM   1. Dizziness   2. Peripheral vertigo, right     Patient is reassured that there are no issues that require transfer to higher level of care at this time or additional tests. Patient is advised to continue home symptomatic  treatment. Patient is advised that if there are new or worsening symptoms to attend the emergency department, contact primary care provider, or return to UC. Instructions of care provided discharged home in stable condition.    THIS NOTE WAS GENERATED USING A VOICE RECOGNITION SOFTWARE PROGRAM. ALL REASONABLE EFFORTS  WERE MADE TO PROOFREAD THIS DOCUMENT FOR ACCURACY.  I have verbally reviewed the discharge instructions with the patient. A printed AVS was given to the patient.  All questions were answered prior to discharge.     Tharon Aquas, PA 08/21/16 1739

## 2016-08-21 NOTE — Discharge Instructions (Signed)
Your blood sugar is 214  Your neurology exam is normal.   Rest this afternoon  If not better, call your doctor to arrange follow up or go to the ER if you need a head scan.

## 2016-08-25 MED FILL — glipiZIDE 5 MG TABS: 5 | 30 days supply | Qty: 60 | Fill #2

## 2016-08-25 MED FILL — LANTUS SOLOSTAR 100 UNITS/M: 100 | 40 days supply | Qty: 6 | Fill #1

## 2016-09-08 ENCOUNTER — Encounter: Payer: Self-pay | Admitting: Cardiovascular Disease

## 2016-09-08 ENCOUNTER — Ambulatory Visit (INDEPENDENT_AMBULATORY_CARE_PROVIDER_SITE_OTHER): Payer: BLUE CROSS/BLUE SHIELD | Admitting: Cardiovascular Disease

## 2016-09-08 VITALS — BP 138/90 | HR 66 | Ht 65.5 in | Wt 249.1 lb

## 2016-09-08 DIAGNOSIS — I480 Paroxysmal atrial fibrillation: Secondary | ICD-10-CM

## 2016-09-08 DIAGNOSIS — I5022 Chronic systolic (congestive) heart failure: Secondary | ICD-10-CM | POA: Diagnosis not present

## 2016-09-08 DIAGNOSIS — I4891 Unspecified atrial fibrillation: Secondary | ICD-10-CM | POA: Diagnosis not present

## 2016-09-08 MED ORDER — RIVAROXABAN 20 MG PO TABS: 20.0000 mg | ORAL_TABLET | Freq: Every day | ORAL | 11 refills | Status: DC

## 2016-09-08 MED FILL — XARELTO 20 MG TABLET: 20 | 30 days supply | Qty: 30 | Fill #0

## 2016-09-08 NOTE — Progress Notes (Signed)
Cardiology Office Note   Date:  09/08/2016   ID:  Kelly Lang, DOB 16-Jan-1961, MRN [id number]  PCP:  Pete Glatter, MD  Cardiologist:   Kristeen Miss, MD   Chief Complaint  Patient presents with  . Atrial Fibrillation   1. Paroxysmal Atrial fibrillation 2. Hypothyroidism 3. Type 2 diabetes mellitus 4. Chronic systolic CHF    [name] is a 55 y.o. female with a hx of T2DM, HL, thyroid disease and atrial fibrillation. She had been on Synthroid for some time after a thyroid bx in 1996. She was admitted 10/2013 with AFib with RVR. Echo (10/26/13): EF 40-45%, normal wall motion, mild LAE. CHADS2-VASc= 3 (LV dysfunction, DM, female). She was placed on coumadin. 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 She was last seen 11/29/2013. She was set up for DCCV after 3-4 weeks of appropriate anticoagulation. DCCV was performed 12/07/2013 but was unsuccessful. She returns for follow up.  She is stable. She is frustrated with her atrial fibrillation. She does get tired more easily. She denies chest pain. She denies significant dyspnea. She is NYHA class II. She denies syncope. She denies orthopnea, PND, edema, palpitations.  Feb. 16, 2015:  She had a cardioversion twice. Converted back to Afib both times. She has had a stress myoview which was negative for ischemia. we will start flecainide today.  February 15, 2014:  Kelly Lang returns for further evaluation of her atrial fib. She has been on Flecainide 100 BID for a month. She started feeling better about 2 weeks ago. She has converted to NSR. 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   Sept. 16, 2015:  Kelly Lang is doing well. 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    February 13, 2015:  Kelly Lang is a 55 y.o. female who presents for follow-up of atrial fibrillation. Is on coumadin . INR is 2.0 this am.   No CP or dyspnea.   Glucose  levels are ok.   HbA1C is a bit high . Was started on Tragenta. Only a little bit of exercise.   Missed 4 days of flecainide due to insurance not paying for it. Staying in NSR  Nov. 29, 2016:  Had a CVA in Oct, 2016.  Had not been on her coumadin and all of her medications.  Echo revealed severe LV dysfunction with EF of 20-25%.   Was started on Xarelto and metoprolol at that time   Has had some some chills and achy feeling for the past couple of days. No cough, no fever, no runny nose.  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     February 08, 2016:  Kelly Lang has been back on her meds.   She had stopped her medications and had developed worsening congestive heart failure. Previous echocardiogram revealed an EF of 25%. She restart her medications and the recent echo 2 days ago reveals recovery of her left ventricular systolic function to an ejection fraction 45% which is her normal.  She's feeling quite a bit better. She has taken flecainide in the past which helped her stay in normal sinus rhythm. She stopped the flecainide due to lack of insurance. She now has [employer] and wants to get back on flecainide.  March 07, 2016:  Kelly Lang is seen back today for follow-up of her atrial fibrillation. She's been on flecainide 100 mg twice a day.  She's had her stress test for flecainide testing and it revealed no QRS widening. She had an unsuccessful cardioversion Jan. 7, 2015 She cannot tell how much she is in NSR vs. Afib.   Oct. 9, 2017:  She is s/p cardioversion in April She is feeling better.  Able to walk without any dyspnea     Past Medical History:  Diagnosis Date  . Allergy   . Arthritis   . Atrial fibrillation with RVR (HCC)    a diagnosed 10/25/2013  . Chicken pox   . Diabetes mellitus (HCC)    a. hg A1c 10, newly diagnosed (10/26/13)  .  Dyslipidemia   . H/O: hypothyroidism    a. thyroid biopsy 1996 with synthroid. Stopped taking rx and was loss to follow up b. normal thyroid function 10/20/13  . Hx of cardiovascular stress test    ETT/Lexiscan Myoview (12/2013):  No ischemia; not gated; low risk.  . Obesity   . 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 2/2 Afib with RVR for unknown duration (EF: 40-45%)  . Thyroid disease     Past Surgical History:  Procedure Laterality Date  . BIOPSY THYROID     1996  . CARDIOVERSION N/A 12/07/2013   Procedure: CARDIOVERSION;  Surgeon: Everette RankJay Varanasi, MD;  [facility];  Service: Cardiovascular;  Laterality: N/A;  . CARDIOVERSION N/A 03/10/2016   Procedure: CARDIOVERSION;  Surgeon: Vesta MixerPhilip J Scottlynn Lindell, MD;  [facility];  Service: Cardiovascular;  Laterality: N/A;  . EYE MUSCLE SURGERY Right ~ 1966  . TONSILLECTOMY  1971     Current Outpatient Prescriptions  Medication Sig Dispense Refill  . acetaminophen (TYLENOL) 325 MG tablet Take 2 tablets (650 mg total) by mouth every 6 (six) hours as needed for mild pain or headache.    Marland Kitchen. acetaminophen (TYLENOL) 500 MG tablet Take 1,000 mg by mouth at bedtime as needed (pain).    . flecainide (TAMBOCOR) 100 MG tablet Take 1 tablet (100 mg total) by mouth 2 (two) times daily. 60 tablet 11  . glipiZIDE (GLUCOTROL) 5 MG tablet Take 1 tablet (5 mg total) by mouth 2 (two) times daily before a meal. 60 tablet 3  . Insulin Glargine (LANTUS SOLOSTAR) 100 UNIT/ML Solostar Pen Inject 15 Units into the skin daily at 10 pm. 5 pen PRN  . Insulin Pen Needle (ULTICARE MICRO PEN NEEDLES) 32G X 4 MM MISC 1 applicator by Does not apply route at bedtime. 100 each 2  . lisinopril (PRINIVIL) 10 MG tablet Take 0.5 tablets (5 mg total) by mouth daily. 45 tablet 3  . lovastatin (MEVACOR) 20 MG tablet TAKE 1 TABLET BY MOUTH AT BEDTIME. 30 tablet 3  . metFORMIN (GLUCOPHAGE) 1000 MG tablet Take 1 tablet (1,000 mg total) by mouth 2 (two) times daily with  a meal. Patient needs office visit 120 tablet 3  . metoprolol tartrate (LOPRESSOR) 25 MG tablet Take 1.5 tablets (37.5 mg total) by mouth 2 (two) times daily. 90 tablet 11  . Vitamin D, Ergocalciferol, (DRISDOL) 50000 units CAPS capsule Take 1 capsule (50,000 Units total) by mouth every 7 (seven) days. 12 capsule 0  . XARELTO 20 MG TABS tablet TAKE 1 TABLET BY MOUTH DAILY WITH SUPPER. 30 tablet 5   No current facility-administered medications for this visit.     Allergies:   Codeine    Social History:  The  patient  reports that she quit smoking about 5 years ago. Her smoking use included Cigarettes. She has a 22.00 pack-year smoking history. She has never used smokeless tobacco. She reports that she drinks alcohol. She reports that she does not use drugs.   Family History:  The patient's family history includes Hyperlipidemia in her mother; Hypertension in her mother.    ROS:  Please see the history of present illness.    Review of Systems: Constitutional:  denies fever, chills, diaphoresis, appetite change and fatigue.  HEENT: denies photophobia, eye pain, redness, hearing loss, ear pain, congestion, sore throat, rhinorrhea, sneezing, neck pain, neck stiffness and tinnitus.  Respiratory: denies SOB, DOE, cough, chest tightness, and wheezing.  Cardiovascular: denies chest pain, palpitations and leg swelling.  Gastrointestinal: denies nausea, vomiting, abdominal pain, diarrhea, constipation, blood in stool.  Genitourinary: denies dysuria, urgency, frequency, hematuria, flank pain and difficulty urinating.  Musculoskeletal: denies  myalgias, back pain, joint swelling, arthralgias and gait problem.   Skin: denies pallor, rash and wound.  Neurological: denies dizziness, seizures, syncope, weakness, light-headedness, numbness and headaches.   Hematological: denies adenopathy, easy bruising, personal or family bleeding history.  Psychiatric/ Behavioral: denies suicidal ideation, mood  changes, confusion, nervousness, sleep disturbance and agitation.       All other systems are reviewed and negative.    PHYSICAL EXAM: VS:  BP 138/90 (BP Location: Right Arm, Patient Position: Sitting, Cuff Size: Large)   Pulse 66   Ht 5' 5.5" (1.664 m)   Wt 249 lb 1.9 oz (113 kg)   BMI 40.83 kg/m  , BMI Body mass index is 40.83 kg/m. GEN: Well nourished, well developed, in no acute distress  HEENT: normal  Neck: no JVD, carotid bruits, or masses Cardiac: Irreg. Irreg. ; no murmurs, rubs, or gallops,no edema  Respiratory:  clear to auscultation bilaterally, normal work of breathing GI: soft, nontender, nondistended, + BS MS: no deformity or atrophy  Skin: warm and dry, no rash Neuro:  Strength and sensation are intact Psych: normal   EKG:  EKG is ordered today.  NSR at 61      Recent Labs: 09/14/2015: B Natriuretic Peptide 323.7 12/26/2015: TSH 0.718 03/07/2016: Hemoglobin 14.8; Platelets 302 06/16/2016: BUN 22; Creat 0.90; Potassium 5.2; Sodium 134    Lipid Panel    Component Value Date/Time   CHOL 185 09/14/2015 2045   TRIG 185 (H) 09/14/2015 2045   HDL 31 (L) 09/14/2015 2045   CHOLHDL 6.0 09/14/2015 2045   VLDL 37 09/14/2015 2045   LDLCALC 117 (H) 09/14/2015 2045   LDLDIRECT 114.0 02/13/2015 0750      Wt Readings from Last 3 Encounters:  09/08/16 249 lb 1.9 oz (113 kg)  06/16/16 245 lb 6.4 oz (111.3 kg)  03/07/16 236 lb (107 kg)      Other studies Reviewed: Additional studies/ records that were reviewed today include: . Review of the above records demonstrates:    ASSESSMENT AND PLAN:  1. Atrial fibrillation- She's back on flecainide 100 mg twice a day.  She has PAF - is in NSR today  Continue same meds.   2. Hypothyroidism - followed by her medical doctor   3. Type 2 diabetes mellitus - has been started on Tragenta .   4. Chronic systolic CHF:  Her ejection fraction has markedly improved now that she is on her medications and her heart rate is  slower. Her ejection fraction is 45% which is her baseline. Continue current medications.    5.  Hyperlipidemia: Continue mevacor for now. Encouraged diet and exercise.   Current medicines are reviewed at length with the patient today.  The patient does not have concerns regarding medicines.  The following changes have been made:  no change  Labs/ tests ordered today include:  No orders of the defined types were placed in this encounter.    Disposition:   FU with me in 6 months     Kristeen Miss, MD  09/08/2016 2:55 PM    [facility] [address] [place], [place], [place]  [address] Phone: [phone]; Fax: [phone]

## 2016-09-08 NOTE — Patient Instructions (Signed)

## 2016-09-16 MED FILL — LISINOPRIL 10 MG TABLET: 10 | 30 days supply | Qty: 15 | Fill #10

## 2016-09-16 MED FILL — METOPROLOL TARTRATE 25 MG T: 25 | 30 days supply | Qty: 90 | Fill #4

## 2016-09-16 MED FILL — LOVASTATIN 20 MG TABLET: 20 | 30 days supply | Qty: 30 | Fill #2

## 2016-09-19 MED FILL — glipiZIDE 5 MG TABS: 5 | 30 days supply | Qty: 60 | Fill #3

## 2016-09-22 MED FILL — ULTICARE PEN NDL 4MM 32G: 32G X 4 MM | 25 days supply | Qty: 100 | Fill #1

## 2016-10-20 ENCOUNTER — Other Ambulatory Visit: Payer: Self-pay | Admitting: Internal Medicine

## 2016-10-20 DIAGNOSIS — I5022 Chronic systolic (congestive) heart failure: Secondary | ICD-10-CM

## 2016-10-20 MED FILL — LOVASTATIN 20 MG TABLET: 20 | 30 days supply | Qty: 30 | Fill #3

## 2016-10-20 MED FILL — METOPROLOL TARTRATE 25 MG T: 25 | 30 days supply | Qty: 90 | Fill #5

## 2016-10-20 MED FILL — glipiZIDE 5 MG TABS: 5 | 30 days supply | Qty: 60 | Fill #0

## 2016-10-20 MED FILL — XARELTO 20 MG TABLET: 20 | 30 days supply | Qty: 30 | Fill #1

## 2016-10-21 MED FILL — ?LISINOPRIL 10 MG TABLET: 10 | 30 days supply | Qty: 15 | Fill #0

## 2016-10-22 MED FILL — LANTUS SOLOSTAR 100 UNITS/M: 100 | 40 days supply | Qty: 6 | Fill #2

## 2016-11-17 ENCOUNTER — Other Ambulatory Visit: Payer: Self-pay | Admitting: Internal Medicine

## 2016-11-17 DIAGNOSIS — I5022 Chronic systolic (congestive) heart failure: Secondary | ICD-10-CM

## 2016-11-17 MED FILL — METOPROLOL TARTRATE 25 MG T: 25 | 30 days supply | Qty: 90 | Fill #6

## 2016-11-17 MED FILL — LISINOPRIL 10 MG TABLET: 10 | 30 days supply | Qty: 15 | Fill #0

## 2016-11-17 MED FILL — LOVASTATIN 20 MG TABLET: 20 | 30 days supply | Qty: 30 | Fill #0

## 2016-11-17 MED FILL — XARELTO 20 MG TABLET: 20 | 30 days supply | Qty: 30 | Fill #2

## 2016-11-19 ENCOUNTER — Encounter: Payer: Self-pay | Admitting: Internal Medicine

## 2016-11-19 ENCOUNTER — Encounter: Payer: Self-pay | Admitting: Nurse Practitioner

## 2016-11-19 ENCOUNTER — Ambulatory Visit: Payer: BLUE CROSS/BLUE SHIELD | Attending: Internal Medicine | Admitting: Internal Medicine

## 2016-11-19 VITALS — BP 113/80 | HR 81 | Temp 98.2°F | Resp 16 | Wt 248.2 lb

## 2016-11-19 DIAGNOSIS — I110 Hypertensive heart disease with heart failure: Secondary | ICD-10-CM | POA: Diagnosis not present

## 2016-11-19 DIAGNOSIS — E119 Type 2 diabetes mellitus without complications: Secondary | ICD-10-CM

## 2016-11-19 DIAGNOSIS — I4891 Unspecified atrial fibrillation: Secondary | ICD-10-CM

## 2016-11-19 DIAGNOSIS — E785 Hyperlipidemia, unspecified: Secondary | ICD-10-CM | POA: Diagnosis not present

## 2016-11-19 DIAGNOSIS — Z794 Long term (current) use of insulin: Secondary | ICD-10-CM | POA: Diagnosis not present

## 2016-11-19 DIAGNOSIS — Z1231 Encounter for screening mammogram for malignant neoplasm of breast: Secondary | ICD-10-CM

## 2016-11-19 DIAGNOSIS — Z1159 Encounter for screening for other viral diseases: Secondary | ICD-10-CM | POA: Diagnosis not present

## 2016-11-19 DIAGNOSIS — I4892 Unspecified atrial flutter: Secondary | ICD-10-CM | POA: Insufficient documentation

## 2016-11-19 DIAGNOSIS — I5022 Chronic systolic (congestive) heart failure: Secondary | ICD-10-CM

## 2016-11-19 DIAGNOSIS — Z1211 Encounter for screening for malignant neoplasm of colon: Secondary | ICD-10-CM | POA: Diagnosis not present

## 2016-11-19 DIAGNOSIS — Z7901 Long term (current) use of anticoagulants: Secondary | ICD-10-CM | POA: Insufficient documentation

## 2016-11-19 DIAGNOSIS — Z23 Encounter for immunization: Secondary | ICD-10-CM | POA: Diagnosis not present

## 2016-11-19 DIAGNOSIS — Z1239 Encounter for other screening for malignant neoplasm of breast: Secondary | ICD-10-CM

## 2016-11-19 DIAGNOSIS — Z114 Encounter for screening for human immunodeficiency virus [HIV]: Secondary | ICD-10-CM | POA: Diagnosis not present

## 2016-11-19 LAB — LIPID PANEL
CHOL/HDL RATIO: 5.2 ratio — AB (ref <5.0)
CHOLESTEROL: 156 mg/dL (ref <200)
HDL: 30 mg/dL — AB (ref >50)
LDL Cholesterol: 67 mg/dL (ref <100)
TRIGLYCERIDES: 296 mg/dL — AB (ref <150)
VLDL: 59 mg/dL — ABNORMAL HIGH (ref <30)

## 2016-11-19 LAB — POCT GLYCOSYLATED HEMOGLOBIN (HGB A1C): HEMOGLOBIN A1C: 9

## 2016-11-19 LAB — GLUCOSE, POCT (MANUAL RESULT ENTRY): POC Glucose: 223 mg/dl — AB (ref 70–99)

## 2016-11-19 MED ORDER — ONETOUCH ULTRASOFT LANCETS MISC: 12 refills | Status: DC

## 2016-11-19 MED ORDER — INSULIN PEN NEEDLE 32G X 4 MM MISC: 1.0000 | Freq: Every day | 2 refills | Status: DC

## 2016-11-19 MED ORDER — METFORMIN HCL 1000 MG PO TABS: 1000.0000 mg | ORAL_TABLET | Freq: Two times a day (BID) | ORAL | 3 refills | Status: DC

## 2016-11-19 MED ORDER — LISINOPRIL 10 MG PO TABS: 5.0000 mg | ORAL_TABLET | Freq: Every day | ORAL | 3 refills | Status: DC

## 2016-11-19 MED ORDER — LOVASTATIN 20 MG PO TABS: 20.0000 mg | ORAL_TABLET | Freq: Every day | ORAL | 3 refills | Status: DC

## 2016-11-19 MED ORDER — INSULIN GLARGINE 100 UNIT/ML SOLOSTAR PEN: 18.0000 [IU] | PEN_INJECTOR | Freq: Every day | SUBCUTANEOUS | 99 refills | Status: DC

## 2016-11-19 MED ORDER — GLUCOSE BLOOD VI STRP: ORAL_STRIP | 12 refills | Status: DC

## 2016-11-19 MED ORDER — GLIPIZIDE 5 MG PO TABS: ORAL_TABLET | ORAL | 3 refills | Status: DC

## 2016-11-19 MED ORDER — ONETOUCH ULTRA 2 W/DEVICE KIT: 1.0000 "application " | PACK | Freq: Two times a day (BID) | 0 refills | Status: DC

## 2016-11-19 NOTE — Patient Instructions (Addendum)
- fu Kelly Lang pharm clinic 2 wks /dm chk.   Check blood sugars on waking up daily., and at least 1-2 times rest of day.    Also check blood sugars about 2 hours after a meal and do this after different meals by rotation  Recommended blood sugar levels on waking up is 90-130 and about 2 hours after meal is 130-160  Please bring your blood sugar monitor to each visit, thank you  Restart exercise  Continue cholesterol med.   - Lantus Titration  Instructions:  IF AM blood sugar is  >150, increase lantus by 1 unit at night.  Continue to check sugars daily. On day 3, if blood sugar >150, continue to increase lantus by 1 unit.  If blood sugar < 70s, than need to reduce dose by 1 unit nightly.  Continue to check blood sugars in am daily and adjust per above.  -- please call us if you have any questions  * tonight - Fri - due 18units lantus at night, Saturday am suger check ---when next change.   -  Epley Maneuver Self-Care WHAT IS THE EPLEY MANEUVER? The Epley maneuver is an exercise you can do to relieve symptoms of benign paroxysmal positional vertigo (BPPV). This condition is often just referred to as vertigo. BPPV is caused by the movement of tiny crystals (canaliths) inside your inner ear. The accumulation and movement of canaliths in your inner ear causes a sudden spinning sensation (vertigo) when you move your head to certain positions. Vertigo usually lasts about 30 seconds. BPPV usually occurs in just one ear. If you get vertigo when you lie on your left side, you probably have BPPV in your left ear. Your health care provider can tell you which ear is involved.  BPPV may be caused by a head injury. Many people older than 50 get BPPV for unknown reasons. If you have been diagnosed with BPPV, your health care provider may teach you how to do this maneuver. BPPV is not life threatening (benign) and usually goes away in time.  WHEN SHOULD I PERFORM THE EPLEY MANEUVER? You can do this  maneuver at home whenever you have symptoms of vertigo. You may do the Epley maneuver up to 3 times a day until your symptoms of vertigo go away. HOW SHOULD I DO THE EPLEY MANEUVER? 1. Sit on the edge of a bed or table with your back straight. Your legs should be extended or hanging over the edge of the bed or table.  2. Turn your head halfway toward the affected ear.  3. Lie backward quickly with your head turned until you are lying flat on your back. You may want to position a pillow under your shoulders.  4. Hold this position for 30 seconds. You may experience an attack of vertigo. This is normal. Hold this position until the vertigo stops. 5. Then turn your head to the opposite direction until your unaffected ear is facing the floor.  6. Hold this position for 30 seconds. You may experience an attack of vertigo. This is normal. Hold this position until the vertigo stops. 7. Now turn your whole body to the same side as your head. Hold for another 30 seconds.  8. You can then sit back up. ARE THERE RISKS TO THIS MANEUVER? In some cases, you may have other symptoms (such as changes in your vision, weakness, or numbness). If you have these symptoms, stop doing the maneuver and call your health care provider. Even if doing  these maneuvers relieves your vertigo, you may still have dizziness. Dizziness is the sensation of light-headedness but without the sensation of movement. Even though the Epley maneuver may relieve your vertigo, it is possible that your symptoms will return within 5 years. WHAT SHOULD I DO AFTER THIS MANEUVER? After doing the Epley maneuver, you can return to your normal activities. Ask your doctor if there is anything you should do at home to prevent vertigo. This may include:  Sleeping with two or more pillows to keep your head elevated.  Not sleeping on the side of your affected ear.  Getting up slowly from bed.  Avoiding sudden movements during the day.  Avoiding  extreme head movement, like looking up or bending over.  Wearing a cervical collar to prevent sudden head movements. WHAT SHOULD I DO IF MY SYMPTOMS GET WORSE? Call your health care provider if your vertigo gets worse. Call your provider right way if you have other symptoms, including:   Nausea.  Vomiting.  Headache.  Weakness.  Numbness.  Vision changes. This information is not intended to replace advice given to you by your health care provider. Make sure you discuss any questions you have with your health care provider. Document Released: 11/22/2013 Document Reviewed: 11/22/2013 Elsevier Interactive Patient Education  2017 Elsevier Inc. Influenza Virus Vaccine injection (Fluarix) What is this medicine? INFLUENZA VIRUS VACCINE (in floo EN zuh VAHY ruhs vak SEEN) helps to reduce the risk of getting influenza also known as the flu. This medicine may be used for other purposes; ask your health care provider or pharmacist if you have questions. COMMON BRAND NAME(S): Fluarix, Fluzone What should I tell my health care provider before I take this medicine? They need to know if you have any of these conditions: -bleeding disorder like hemophilia -fever or infection -Guillain-Barre syndrome or other neurological problems -immune system problems -infection with the human immunodeficiency virus (HIV) or AIDS -low blood platelet counts -multiple sclerosis -an unusual or allergic reaction to influenza virus vaccine, eggs, chicken proteins, latex, gentamicin, other medicines, foods, dyes or preservatives -pregnant or trying to get pregnant -breast-feeding How should I use this medicine? This vaccine is for injection into a muscle. It is given by a health care professional. A copy of Vaccine Information Statements will be given before each vaccination. Read this sheet carefully each time. The sheet may change frequently. Talk to your pediatrician regarding the use of this medicine in  children. Special care may be needed. Overdosage: If you think you have taken too much of this medicine contact a poison control center or emergency room at once. NOTE: This medicine is only for you. Do not share this medicine with others. What if I miss a dose? This does not apply. What may interact with this medicine? -chemotherapy or radiation therapy -medicines that lower your immune system like etanercept, anakinra, infliximab, and adalimumab -medicines that treat or prevent blood clots like warfarin -phenytoin -steroid medicines like prednisone or cortisone -theophylline -vaccines This list may not describe all possible interactions. Give your health care provider a list of all the medicines, herbs, non-prescription drugs, or dietary supplements you use. Also tell them if you smoke, drink alcohol, or use illegal drugs. Some items may interact with your medicine. What should I watch for while using this medicine? Report any side effects that do not go away within 3 days to your doctor or health care professional. Call your health care provider if any unusual symptoms occur within 6 weeks of receiving  this vaccine. You may still catch the flu, but the illness is not usually as bad. You cannot get the flu from the vaccine. The vaccine will not protect against colds or other illnesses that may cause fever. The vaccine is needed every year. What side effects may I notice from receiving this medicine? Side effects that you should report to your doctor or health care professional as soon as possible: -allergic reactions like skin rash, itching or hives, swelling of the face, lips, or tongue Side effects that usually do not require medical attention (report to your doctor or health care professional if they continue or are bothersome): -fever -headache -muscle aches and pains -pain, tenderness, redness, or swelling at site where injected -weak or tired This list may not describe all possible  side effects. Call your doctor for medical advice about side effects. You may report side effects to FDA at 1-800-FDA-1088. Where should I keep my medicine? This vaccine is only given in a clinic, pharmacy, doctor's office, or other health care setting and will not be stored at home. NOTE: This sheet is a summary. It may not cover all possible information. If you have questions about this medicine, talk to your doctor, pharmacist, or health care provider.  2017 Elsevier/Gold Standard (2008-06-14 09:30:40)

## 2016-11-19 NOTE — Progress Notes (Signed)
Kelly Lang, is a 55 y.o. female  IDC:[id number]  ILN:[id number]  DOB - 26-Jan-1961  Chief Complaint  Patient presents with  . Diabetes        Subjective:   Kelly Lang is a 55 y.o. female here today for a follow up visit., w/ hx of atrial flutter, dm2, morbid obesity, hld, htn, and chronic systolic chf, ef 15%, being followed by cardiology.  Since last seen, she has new onset c/o of vertigo that started this past Saturday. Quite severe, room spinning. Initially thought she had ear infection, but denies cough/f/c/ear pain.  Spinning has improved, but c/o of "her head feeling foggy" now.  Able to use lantus, but has been on same dose of 15units since prescribed, not self-titrating.     Patient has No headache, No chest pain, No abdominal pain - No Nausea, No new weakness tingling or numbness, No Cough - SOB.  No problems updated.  ALLERGIES: Allergies  Allergen Reactions  . Codeine Itching and Rash    PAST MEDICAL HISTORY: Past Medical History:  Diagnosis Date  . Allergy   . Arthritis   . Atrial fibrillation with RVR ([place])    a diagnosed 10/25/2013  . Chicken pox   . Diabetes mellitus ([place])    a. hg A1c 10, newly diagnosed (10/26/13)  . Dyslipidemia   . H/O: hypothyroidism    a. thyroid biopsy 1996 with synthroid. Stopped taking rx and was loss to follow up b. normal thyroid function 10/20/13  . Hx of cardiovascular stress test    ETT/Lexiscan Myoview (12/2013):  No ischemia; not gated; low risk.  . Obesity   . 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 2/2 Afib with RVR for unknown duration (EF: 40-45%)  . Thyroid disease     MEDICATIONS AT HOME: Prior to Admission medications   Medication Sig Start Date End Date Taking? Authorizing Provider  flecainide (TAMBOCOR) 100 MG tablet Take 1 tablet (100 mg total) by mouth 2 (two) times daily. 02/08/16  Yes Thayer Headings, MD  glipiZIDE (GLUCOTROL) 5 MG tablet TAKE 1 TABLET BY MOUTH 2 TIMES DAILY BEFORE A  MEAL. 11/19/16  Yes Maren Reamer, MD  Insulin Glargine (LANTUS SOLOSTAR) 100 UNIT/ML Solostar Pen Inject 18 Units into the skin daily at 10 pm. 11/19/16  Yes Maren Reamer, MD  Insulin Pen Needle (ULTICARE MICRO PEN NEEDLES) 32G X 4 MM MISC 1 applicator by Does not apply route at bedtime. 11/19/16  Yes Maren Reamer, MD  lisinopril (PRINIVIL,ZESTRIL) 10 MG tablet TAKE 1/2 TABLET BY MOUTH DAILY 11/17/16  Yes Maren Reamer, MD  lovastatin (MEVACOR) 20 MG tablet TAKE 1 TABLET BY MOUTH AT BEDTIME. 11/17/16  Yes Maren Reamer, MD  metFORMIN (GLUCOPHAGE) 1000 MG tablet Take 1 tablet (1,000 mg total) by mouth 2 (two) times daily with a meal. Patient needs office visit 11/19/16  Yes Maren Reamer, MD  metoprolol tartrate (LOPRESSOR) 25 MG tablet Take 1.5 tablets (37.5 mg total) by mouth 2 (two) times daily. 02/08/16  Yes Thayer Headings, MD  rivaroxaban (XARELTO) 20 MG TABS tablet Take 1 tablet (20 mg total) by mouth daily with supper. 09/08/16  Yes Thayer Headings, MD  acetaminophen (TYLENOL) 325 MG tablet Take 2 tablets (650 mg total) by mouth every 6 (six) hours as needed for mild pain or headache. Patient not taking: Reported on 11/19/2016 09/16/15   Debbe Odea, MD  acetaminophen (TYLENOL) 500 MG tablet Take 1,000 mg by mouth  at bedtime as needed (pain).    Historical Provider, MD  Blood Glucose Monitoring Suppl (ONE TOUCH ULTRA 2) w/Device KIT 1 application by Does not apply route 2 (two) times daily. 11/19/16   Maren Reamer, MD  glucose blood (ONE TOUCH TEST STRIPS) test strip Use as instructed 11/19/16   Maren Reamer, MD  Lancets [employer] ULTRASOFT) lancets Use as instructed 11/19/16   Maren Reamer, MD  Vitamin D, Ergocalciferol, (DRISDOL) 50000 units CAPS capsule Take 1 capsule (50,000 Units total) by mouth every 7 (seven) days. Patient not taking: Reported on 11/19/2016 06/16/16   Maren Reamer, MD     Objective:   Vitals:   11/19/16 1015  BP: 113/80  Pulse:  81  Resp: 16  Temp: 98.2 F (36.8 C)  TempSrc: Oral  SpO2: 98%  Weight: 248 lb 3.2 oz (112.6 kg)    Exam General appearance : Awake, alert, not in any distress. Speech Clear. Not toxic looking HEENT: Atraumatic and Normocephalic, pupils equally reactive to light. Neck: supple, no JVD. No cervical lymphadenopathy. bilat TMs clear. No boggy nairs. No Nystamus, no vertigo currently. Chest:Good air entry bilaterally, no added sounds. CVS: S1 S2 regular, no murmurs/gallups or rubs. Abdomen: Bowel sounds active, obese, Non tender and not distended with no gaurding, rigidity or rebound. Extremities: B/L Lower Ext shows no edema, both legs are warm to touch Neurology: Awake alert, and oriented X 3, CN II-XII grossly intact, Non focal Skin:No Rash  Data Review Lab Results  Component Value Date   HGBA1C 9.0 11/19/2016   HGBA1C 9.2 06/16/2016   HGBA1C 8.30 12/26/2015    Depression screen PHQ 2/9 11/19/2016 06/16/2016 02/21/2016 12/26/2015 07/03/2014  Decreased Interest 0 0 1 0 0  Down, Depressed, Hopeless 0 0 0 1 1  PHQ - 2 Score 0 0 '1 1 1  ' Altered sleeping - - 0 - -  Tired, decreased energy - - 1 - -  Change in appetite - - 1 - -  Feeling bad or failure about yourself  - - 1 - -  Trouble concentrating - - 0 - -  Moving slowly or fidgety/restless - - 1 - -  Suicidal thoughts - - 0 - -  PHQ-9 Score - - 5 - -      Assessment & Plan   1. Type 2 diabetes mellitus without complication, without long-term current use of insulin (HCC) dw pt low carb diet again, increase exercise - dw pt self-titration of lantus q3days, and she was able to repeat instructions to me.  - Increase lantus to 18units for now, but Saturday would be her next adjustment at home if cbg >150. - POCT glucose (manual entry) - POCT glycosylated hemoglobin (Hb A1C) 9.2 (slightly better) - metFORMIN (GLUCOPHAGE) 1000 MG tablet; Take 1 tablet (1,000 mg total) by mouth 2 (two) times daily with a meal. Patient needs office  visit  Dispense: 120 tablet; Refill: 3 - fu Stacey 2 wks for dm chk/pharm clinic  2. Cm, w/ hx of chronic systolic chf, ef 56% - renewed lisinopril 5 qd - cont metoprolol  37.5 bid  3. Hx of aflutter - per cards, on xarelto 20 qd and flecainide 100bid   4. Need for hepatitis C screening test - Hepatitis C antibody  5. Hyperlipidemia, unspecified hyperlipidemia type - continue lovastatin 20 qhs - Lipid Panel - fasting  6. Breast cancer screening - MM Digital Screening; Future  7. Colon cancer screening - Ambulatory referral to Gastroenterology  8. Flu vac today  Patient have been counseled extensively about nutrition and exercise  Return in about 3 months (around 02/17/2017), or if symptoms worsen or fail to improve.  The patient was given clear instructions to go to ER or return to medical center if symptoms don't improve, worsen or new problems develop. The patient verbalized understanding. The patient was told to call to get lab results if they haven't heard anything in the next week.   This note has been created with Surveyor, quantity. Any transcriptional errors are unintentional.   Maren Reamer, MD, [facility], [place]   11/19/2016, 12:00 PM

## 2016-11-19 NOTE — Progress Notes (Signed)
Pt is in the office today for diabetes Pt states she is not in any pain Pt states she has been having some vertigo that started last saturday

## 2016-11-20 LAB — HEPATITIS C ANTIBODY: HCV Ab: NEGATIVE

## 2016-11-20 LAB — HIV ANTIBODY (ROUTINE TESTING W REFLEX): HIV 1&2 Ab, 4th Generation: NONREACTIVE

## 2016-12-03 MED FILL — LANTUS SOLOSTAR 100 UNITS/M: 100 | 40 days supply | Qty: 6 | Fill #3

## 2016-12-03 MED FILL — glipiZIDE 5 MG TABS: 5 | 30 days supply | Qty: 60 | Fill #0

## 2016-12-08 ENCOUNTER — Ambulatory Visit: Payer: BLUE CROSS/BLUE SHIELD | Admitting: Nurse Practitioner

## 2016-12-15 ENCOUNTER — Ambulatory Visit: Payer: BLUE CROSS/BLUE SHIELD | Admitting: Gastroenterology

## 2016-12-18 ENCOUNTER — Other Ambulatory Visit: Payer: Self-pay | Admitting: Internal Medicine

## 2016-12-18 DIAGNOSIS — I5022 Chronic systolic (congestive) heart failure: Secondary | ICD-10-CM

## 2016-12-18 MED FILL — LOVASTATIN 20 MG TABLET: 20 | 30 days supply | Qty: 30 | Fill #1

## 2016-12-18 MED FILL — XARELTO 20 MG TABLET: 20 | 30 days supply | Qty: 30 | Fill #3

## 2016-12-18 MED FILL — METOPROLOL TARTRATE 25 MG T: 25 | 30 days supply | Qty: 90 | Fill #7

## 2016-12-23 MED FILL — LISINOPRIL 10 MG TABLET: 10 | 90 days supply | Qty: 45 | Fill #0

## 2016-12-26 ENCOUNTER — Telehealth: Payer: Self-pay | Admitting: Internal Medicine

## 2016-12-26 MED FILL — TRUEPLUS PEN NDL 32GX5/32: 32G X 4 MM | 50 days supply | Qty: 100 | Fill #0

## 2016-12-26 MED FILL — glipiZIDE 5 MG TABS: 5 | 30 days supply | Qty: 60 | Fill #1

## 2016-12-26 NOTE — Telephone Encounter (Signed)
Kelly Lang states she is on her last pen Kelly Lang states the pen will last her a week and she will need a refill. Kelly Lang states the directions that is on the pen is not the same directions that Dr. Julien Nordmann told her

## 2016-12-26 NOTE — Telephone Encounter (Signed)
Patient states that her prescription for Insulin Glargine (LANTUS SOLOSTAR) 100 UNIT/ML Solostar Pen has the wrong dosage amount. Pharamcy sent her over to [place] to ask for a note to be put in. Please f/u.

## 2016-12-27 MED ORDER — INSULIN GLARGINE 100 UNIT/ML SOLOSTAR PEN: 26.0000 [IU] | PEN_INJECTOR | Freq: Every day | SUBCUTANEOUS | 3 refills | Status: DC

## 2016-12-27 NOTE — Telephone Encounter (Signed)
Call pt, confirmed dob. Doing home q3 day self titration w/ lantus.   Now on lantus 26 units, am cbg 150s, afternoon cbgs 100s.   This is first time she feels so well and sugars better controlled. Had uri last couple of weeks. Needs to make appt w/ Misty Stanley, pharm -  Will adjust lantus order to reflect this.

## 2016-12-29 ENCOUNTER — Ambulatory Visit
Admission: RE | Admit: 2016-12-29 | Discharge: 2016-12-29 | Disposition: A | Discharge status: Home / Self Care | Payer: BLUE CROSS/BLUE SHIELD | Source: Ambulatory Visit | Attending: Internal Medicine | Admitting: Internal Medicine

## 2016-12-29 DIAGNOSIS — Z1231 Encounter for screening mammogram for malignant neoplasm of breast: Secondary | ICD-10-CM | POA: Diagnosis not present

## 2016-12-29 DIAGNOSIS — Z1239 Encounter for other screening for malignant neoplasm of breast: Secondary | ICD-10-CM

## 2016-12-30 ENCOUNTER — Other Ambulatory Visit: Payer: Self-pay | Admitting: Internal Medicine

## 2016-12-30 DIAGNOSIS — N6320 Unspecified lump in the left breast, unspecified quadrant: Secondary | ICD-10-CM

## 2017-01-01 ENCOUNTER — Telehealth: Payer: Self-pay

## 2017-01-01 ENCOUNTER — Telehealth: Payer: Self-pay | Admitting: Internal Medicine

## 2017-01-01 NOTE — Telephone Encounter (Signed)
Pt. Returned call regarding mammogram results. Pt. States she did not understand the VM that was left. Please f/u

## 2017-01-01 NOTE — Telephone Encounter (Signed)
Contacted pt to go over MM results pt didn't answer but looks like pt is aware of MM results pt has TOMO and [place] schedule for tomorrow and if she has any questions or concerns to give me a call

## 2017-01-01 NOTE — Telephone Encounter (Signed)
Returned pt call pt states she got her MM results on MyChart

## 2017-01-02 ENCOUNTER — Ambulatory Visit
Admission: RE | Admit: 2017-01-02 | Discharge: 2017-01-02 | Disposition: A | Discharge status: Home / Self Care | Payer: BLUE CROSS/BLUE SHIELD | Source: Ambulatory Visit | Attending: Internal Medicine | Admitting: Internal Medicine

## 2017-01-02 DIAGNOSIS — N6320 Unspecified lump in the left breast, unspecified quadrant: Secondary | ICD-10-CM

## 2017-01-02 DIAGNOSIS — N6321 Unspecified lump in the left breast, upper outer quadrant: Secondary | ICD-10-CM | POA: Diagnosis not present

## 2017-01-02 MED FILL — LANTUS SOLOSTAR 100 UNITS/M: 100 | 35 days supply | Qty: 9 | Fill #0

## 2017-01-14 ENCOUNTER — Ambulatory Visit: Payer: BLUE CROSS/BLUE SHIELD | Attending: Internal Medicine | Admitting: Pharmacist

## 2017-01-14 DIAGNOSIS — Z7984 Long term (current) use of oral hypoglycemic drugs: Secondary | ICD-10-CM | POA: Diagnosis not present

## 2017-01-14 DIAGNOSIS — E1165 Type 2 diabetes mellitus with hyperglycemia: Secondary | ICD-10-CM | POA: Diagnosis not present

## 2017-01-14 DIAGNOSIS — E119 Type 2 diabetes mellitus without complications: Secondary | ICD-10-CM | POA: Diagnosis not present

## 2017-01-14 MED ORDER — INSULIN GLARGINE 100 UNIT/ML SOLOSTAR PEN: 32.0000 [IU] | PEN_INJECTOR | Freq: Every day | SUBCUTANEOUS | 3 refills | Status: DC

## 2017-01-14 NOTE — Progress Notes (Signed)
    S:    Patient arrives in good spirits.  Presents for diabetes evaluation, education, and management at the request of Dr. Julien Nordmann. Patient was referred on 11/19/16.  Patient was last seen by Primary Care Provider on 11/19/16.   Patient reports adherence with medications.  Current diabetes medications include: Lantus 28 units daily, glipizide 5 mg BID, metformin 1000 mg BID  Patient denies hypoglycemic events.  Patient reported dietary habits: doesn't follow a particular diet but tries to avoid carbs  Patient reported exercise habits: none   Patient reports nocturia.  Patient denies neuropathy. Patient denies visual changes. Patient reports self foot exams.   Patient reports that she cannot pick up a new meter from the pharmacy. She would like a different brand sent that is covered by her insurance.  O:  Lab Results  Component Value Date   HGBA1C 9.0 11/19/2016   There were no vitals filed for this visit.  Home fasting CBG: 168-200s  2 hour post-prandial/random CBG: 109-130   A/P: Diabetes longstanding currently UNcontrolled based on A1c of 9 and home CBGs. Patient denies hypoglycemic events and is able to verbalize appropriate hypoglycemia management plan. Patient reports adherence with medication. Control is suboptimal due to dietary indiscretion and sedentary lifestyle.  Continue metformin 1000 mg BID, glipizide 5 mg BID. Increase Lantus to 32 units. Patient to continue self titration as directed by Dr. Julien Nordmann but we will lower the goal to <130 to get her closer to what we want her blood glucose levels to be. Will order Accu-chek meter - OneTouch is covered by her insurance but we do not carry these in our pharmacy.   Next A1C anticipated March 2018.    Written patient instructions provided.  Total time in face to face counseling 20 minutes.   Follow up in Pharmacist Clinic Visit in 2 weeks.   Patient seen with Mable Fill, PharmD Candidate

## 2017-01-14 NOTE — Patient Instructions (Addendum)
Thanks for coming to see [place]!  Increase Lantus to 32 units daily  Continue your titration until blood sugars < 130 (this number is closer to the goal we are trying to get you too)  Come back in 2 weeks.   Hypoglycemia  Hypoglycemia is when the sugar (glucose) level in the blood is too low. Symptoms of low blood sugar may include:  Feeling:  Hungry.  Worried or nervous (anxious).  Sweaty and clammy.  Confused.  Dizzy.  Sleepy.  Sick to your stomach (nauseous).  Having:  A fast heartbeat.  A headache.  A change in your vision.  Jerky movements that you cannot control (seizure).  Nightmares.  Tingling or no feeling (numbness) around the mouth, lips, or tongue.  Having trouble with:  Talking.  Paying attention (concentrating).  Moving (coordination).  Sleeping.  Shaking.  Passing out (fainting).  Getting upset easily (irritability). Low blood sugar can happen to people who have diabetes and people who do not have diabetes. Low blood sugar can happen quickly, and it can be an emergency. Treating Low Blood Sugar  Low blood sugar is often treated by eating or drinking something sugary right away. If you can think clearly and swallow safely, follow the 15:15 rule:  Take 15 grams of a fast-acting carb (carbohydrate). Some fast-acting carbs are:  1 tube of glucose gel.  3 sugar tablets (glucose pills).  6-8 pieces of hard candy.  4 oz (120 mL) of fruit juice.  4 oz (120 mL) of regular (not diet) soda.  Check your blood sugar 15 minutes after you take the carb.  If your blood sugar is still at or below 70 mg/dL (3.9 mmol/L), take 15 grams of a carb again.  If your blood sugar does not go above 70 mg/dL (3.9 mmol/L) after 3 tries, get help right away.  After your blood sugar goes back to normal, eat a meal or a snack within 1 hour. Treating Very Low Blood Sugar  If your blood sugar is at or below 54 mg/dL (3 mmol/L), you have very low blood sugar  (severe hypoglycemia). This is an emergency. Do not wait to see if the symptoms will go away. Get medical help right away. Call your local emergency services (911 in the [place] Do not drive yourself to the hospital. If you have very low blood sugar and you cannot eat or drink, you may need a glucagon shot (injection). A family member or friend should learn how to check your blood sugar and how to give you a glucagon shot. Ask your doctor if you need to have a glucagon shot kit at home. Follow these instructions at home: General instructions  Avoid any diets that cause you to not eat enough food. Talk with your doctor before you start any new diet.  Take over-the-counter and prescription medicines only as told by your doctor.  Limit alcohol to no more than 1 drink per day for nonpregnant women and 2 drinks per day for men. One drink equals 12 oz of beer, 5 oz of wine, or 1 oz of hard liquor.  Keep all follow-up visits as told by your doctor. This is important. If You Have Diabetes:   Make sure you know the symptoms of low blood sugar.  Always keep a source of sugar with you, such as:  Sugar.  Sugar tablets.  Glucose gel.  Fruit juice.  Regular soda (not diet soda).  Milk.  Hard candy.  Honey.  Take your medicines as  told.  Follow your exercise and meal plan.  Eat on time. Do not skip meals.  Follow your sick day plan when you cannot eat or drink normally. Make this plan ahead of time with your doctor.  Check your blood sugar as often as told by your doctor. Always check before and after exercise.  Share your diabetes care plan with:  Your work or school.  People you live with.  Check your pee (urine) for ketones:  When you are sick.  As told by your doctor.  Carry a card or wear jewelry that says you have diabetes. If You Have Low Blood Sugar From Other Causes:   Check your blood sugar as often as told by your doctor.  Follow instructions from your doctor  about what you cannot eat or drink. Contact a doctor if:  You have trouble keeping your blood sugar in your target range.  You have low blood sugar often. Get help right away if:  You still have symptoms after you eat or drink something sugary.  Your blood sugar is at or below 54 mg/dL (3 mmol/L).  You have jerky movements that you cannot control.  You pass out. These symptoms may be an emergency. Do not wait to see if the symptoms will go away. Get medical help right away. Call your local emergency services (911 in the [place] Do not drive yourself to the hospital.  This information is not intended to replace advice given to you by your health care provider. Make sure you discuss any questions you have with your health care provider. Document Released: 02/11/2010 Document Revised: 04/24/2016 Document Reviewed: 12/21/2015 Elsevier Interactive Patient Education  2017 [employer].

## 2017-01-19 ENCOUNTER — Ambulatory Visit (INDEPENDENT_AMBULATORY_CARE_PROVIDER_SITE_OTHER): Payer: BLUE CROSS/BLUE SHIELD | Admitting: Gastroenterology

## 2017-01-19 ENCOUNTER — Encounter: Payer: Self-pay | Admitting: Gastroenterology

## 2017-01-19 VITALS — BP 120/90 | HR 80 | Ht 65.5 in | Wt 249.0 lb

## 2017-01-19 DIAGNOSIS — E119 Type 2 diabetes mellitus without complications: Secondary | ICD-10-CM

## 2017-01-19 DIAGNOSIS — Z1211 Encounter for screening for malignant neoplasm of colon: Secondary | ICD-10-CM | POA: Diagnosis not present

## 2017-01-19 DIAGNOSIS — Z7901 Long term (current) use of anticoagulants: Secondary | ICD-10-CM

## 2017-01-19 DIAGNOSIS — Z794 Long term (current) use of insulin: Secondary | ICD-10-CM | POA: Diagnosis not present

## 2017-01-19 DIAGNOSIS — IMO0001 Reserved for inherently not codable concepts without codable children: Secondary | ICD-10-CM

## 2017-01-19 NOTE — Progress Notes (Signed)
01/19/2017 Kelly Lang [id number] 31-Jul-1961   HISTORY OF PRESENT ILLNESS:  This is a 56 year old female who is new to our practice.  Was sent here by her PCP, Dr. Janne Napoleon, to discuss screening colonoscopy.  Never had one in the past.  Is on Xarelto for atrial fibrillation.  Follows with Dr. Acie Fredrickson.  Was cardioverted successfully in 03/2016 and remains in NSR.  Also has IDDM; has only been on insulin for about 6 months.  Denies any GI complaints except diarrhea/loose stools, which she says she knows is from her Metformin.  Does not know FH because she was adopted.   Past Medical History:  Diagnosis Date  . Allergy   . Arthritis   . Atrial fibrillation with RVR ([place])    a diagnosed 10/25/2013  . Chicken pox   . Diabetes mellitus ([place])    a. hg A1c 10, newly diagnosed (10/26/13)  . Dyslipidemia   . H/O: hypothyroidism    a. thyroid biopsy 1996 with synthroid. Stopped taking rx and was loss to follow up b. normal thyroid function 10/20/13  . Hx of cardiovascular stress test    ETT/Lexiscan Myoview (12/2013):  No ischemia; not gated; low risk.  . Obesity   . 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 2/2 Afib with RVR for unknown duration (EF: 40-45%)  . Thyroid disease    Past Surgical History:  Procedure Laterality Date  . BIOPSY THYROID     1996  . CARDIOVERSION N/A 12/07/2013   Procedure: CARDIOVERSION;  Surgeon: Casandra Doffing, MD;  Location[facility] ENDOSCOPY;  Service: Cardiovascular;  Laterality: N/A;  . CARDIOVERSION N/A 03/10/2016   Procedure: CARDIOVERSION;  Surgeon: Thayer Headings, MD;  Location: [facility];  Service: Cardiovascular;  Laterality: N/A;  . EYE MUSCLE SURGERY Right ~ 1966  . TONSILLECTOMY  1971    reports that she quit smoking about 6 years ago. Her smoking use included Cigarettes. She has a 22.00 pack-year smoking history. She has never used smokeless tobacco. She reports that she drinks alcohol. She reports that she does not use drugs. family  history includes Hyperlipidemia in her mother; Hypertension in her mother. She was adopted. Allergies  Allergen Reactions  . Codeine Itching and Rash      Outpatient Encounter Prescriptions as of 01/19/2017  Medication Sig  . acetaminophen (TYLENOL) 500 MG tablet Take 1,000 mg by mouth at bedtime as needed (pain).  . Blood Glucose Monitoring Suppl (ONE TOUCH ULTRA 2) w/Device KIT 1 application by Does not apply route 2 (two) times daily.  . flecainide (TAMBOCOR) 100 MG tablet Take 1 tablet (100 mg total) by mouth 2 (two) times daily.  Marland Kitchen glipiZIDE (GLUCOTROL) 5 MG tablet TAKE 1 TABLET BY MOUTH 2 TIMES DAILY BEFORE A MEAL.  Marland Kitchen glucose blood (ONE TOUCH TEST STRIPS) test strip Use as instructed  . Insulin Glargine (LANTUS SOLOSTAR) 100 UNIT/ML Solostar Pen Inject 32 Units into the skin daily at 10 pm.  . Insulin Pen Needle (ULTICARE MICRO PEN NEEDLES) 32G X 4 MM MISC 1 applicator by Does not apply route at bedtime.  . Lancets (ONETOUCH ULTRASOFT) lancets Use as instructed  . lisinopril (PRINIVIL,ZESTRIL) 10 MG tablet Take 0.5 tablets (5 mg total) by mouth daily.  Marland Kitchen lovastatin (MEVACOR) 20 MG tablet Take 1 tablet (20 mg total) by mouth at bedtime.  . metFORMIN (GLUCOPHAGE) 1000 MG tablet Take 1 tablet (1,000 mg total) by mouth 2 (two) times daily with a meal. Patient needs office  visit  . metoprolol tartrate (LOPRESSOR) 25 MG tablet Take 1.5 tablets (37.5 mg total) by mouth 2 (two) times daily.  . rivaroxaban (XARELTO) 20 MG TABS tablet Take 1 tablet (20 mg total) by mouth daily with supper.  . Vitamin D, Ergocalciferol, (DRISDOL) 50000 units CAPS capsule Take 1 capsule (50,000 Units total) by mouth every 7 (seven) days.  . [DISCONTINUED] acetaminophen (TYLENOL) 325 MG tablet Take 2 tablets (650 mg total) by mouth every 6 (six) hours as needed for mild pain or headache. (Patient not taking: Reported on 11/19/2016)   No facility-administered encounter medications on file as of 01/19/2017.       REVIEW OF SYSTEMS  : All other systems reviewed and negative except where noted in the History of Present Illness.   PHYSICAL EXAM: BP 120/90   Pulse 80   Ht 5' 5.5" (1.664 m)   Wt 249 lb (112.9 kg)   BMI 40.81 kg/m  General: Well developed white female in no acute distress Head: Normocephalic and atraumatic Eyes:  Sclerae anicteric, conjunctiva pink. Ears: Normal auditory acuity Lungs: Clear throughout to auscultation; no increased WOB Heart: Regular rate and rhythm Abdomen: Soft, non-distended. Normal bowel sounds.  Non-tender. Rectal:  Will be done at the time of procedure. Musculoskeletal: Symmetrical with no gross deformities  Skin: No lesions on visible extremities Extremities: No edema  Neurological: Alert oriented x 4, grossly non-focal Psychological:  Alert and cooperative. Normal mood and affect  ASSESSMENT AND PLAN: -Screening colonoscopy:  Will schedule with Dr. Fuller Plan.   -Chronic anticoagulation with Xarelto for atrial fibrillation:  Will hold Xarelto for 2 days prior to endoscopic procedures - will instruct when and how to resume after procedure. Benefits and risks of procedure explained including risks of bleeding, perforation, infection, missed lesions, reactions to medications and possible need for hospitalization and surgery for complications. Additional rare but real risk of stroke or other vascular clotting events off of Xarelto also explained and need to seek urgent help if any signs of these problems occur. Will communicate by phone or EMR with patient's  prescribing provider, Dr. Fuller Plan, to confirm that holding Xarelto is reasonable in this case.  -IDDM:  Insulin will be adjusted prior to endoscopic procedure per protocol. Will resume normal dosing after procedure.  CC:  Maren Reamer, MD

## 2017-01-19 NOTE — Progress Notes (Signed)
Reviewed and agree with management plan.  Malcolm T. Stark, MD FACG 

## 2017-01-19 NOTE — Patient Instructions (Signed)
It has been recommended to you by your provider that you have a(n) screening colonoscopy completed. Per your request, we did not schedule the procedure(s) today. Please contact our office at [phone] later in the month for more procedure date options.  If you are age 54 or older, your body mass index should be between 23-30. Your Body mass index is 40.81 kg/m. If this is out of the aforementioned range listed, please consider follow up with your Primary Care Provider.  If you are age 56 or younger, your body mass index should be between 19-25. Your Body mass index is 40.81 kg/m. If this is out of the aformentioned range listed, please consider follow up with your Primary Care Provider.   Thank you for choosing [facility]

## 2017-01-20 MED FILL — LOVASTATIN 20 MG TABLET: 20 | 30 days supply | Qty: 30 | Fill #2

## 2017-01-20 MED FILL — METOPROLOL TARTRATE 25 MG T: 25 | 30 days supply | Qty: 90 | Fill #8

## 2017-01-20 MED FILL — XARELTO 20 MG TABLET: 20 | 30 days supply | Qty: 30 | Fill #4

## 2017-01-29 ENCOUNTER — Encounter: Payer: Self-pay | Admitting: Gastroenterology

## 2017-01-29 ENCOUNTER — Ambulatory Visit: Payer: BLUE CROSS/BLUE SHIELD | Admitting: Pharmacist

## 2017-01-29 MED FILL — LANTUS SOLOSTAR 100 UNITS/M: 100 | 28 days supply | Qty: 9 | Fill #0

## 2017-01-29 MED FILL — glipiZIDE 5 MG TABS: 5 | 30 days supply | Qty: 60 | Fill #2

## 2017-02-20 ENCOUNTER — Other Ambulatory Visit: Payer: Self-pay | Admitting: Cardiovascular Disease

## 2017-02-20 MED FILL — METOPROLOL TARTRATE 25 MG T: 25 | 30 days supply | Qty: 90 | Fill #0

## 2017-02-20 MED FILL — XARELTO 20 MG TABLET: 20 | 30 days supply | Qty: 30 | Fill #5

## 2017-02-23 MED FILL — LANTUS SOLOSTAR 100 UNITS/M: 100 | 28 days supply | Qty: 9 | Fill #1

## 2017-02-24 MED FILL — LOVASTATIN 20 MG TABLET: 20 | 30 days supply | Qty: 30 | Fill #0

## 2017-02-24 MED FILL — glipiZIDE 5 MG TABS: 5 | 30 days supply | Qty: 60 | Fill #3

## 2017-03-10 ENCOUNTER — Telehealth: Payer: Self-pay | Admitting: *Deleted

## 2017-03-10 NOTE — Telephone Encounter (Signed)
Dr Russella Dar,   This lady is scheduled for a PV with [place] 4-23 with a colon on 03-31-17. She was seen in the office by J Zehr 01-19-17. In the note Shanda Bumps states she is on Xarelto for A FIb, and it states pt needs to hold the Xarelto for 2 days and will confirm with you. There are no other notes about her Xarelto in epic.  Is it okay to hold the Xarelto for 2 days or do we need clearance from Dr Melburn Popper her cardio?  Please advise and thanks for your time, Elizebeth Brooking

## 2017-03-11 NOTE — Telephone Encounter (Signed)
Amanda please check on clearance for a 2 day Xarelto hold.

## 2017-03-12 NOTE — Telephone Encounter (Signed)
Ayn may hold Xarelto for 2 days prior to procedure

## 2017-03-12 NOTE — Telephone Encounter (Signed)
Informed patient per Dr. Elease Hashimoto to hold Xarelto 2 days prior to her procedure on 03/31/17. Patient verbalized understanding. Message sent to [facility] nurse to put this on her instructions for Colonoscopy.

## 2017-03-12 NOTE — Telephone Encounter (Signed)
   Kelly Lang 11-17-1961 [id number]  Dear Dr. Elease Hashimoto:  We have scheduled the above named patient for a(n) Colonoscopy procedure. Our records show that (s)he is on anticoagulation therapy.  Please advise as to whether the patient may come of their therapy of Xarelto 2 days prior to their procedure which is scheduled for 03/31/17.  Please route your response to Christie Nottingham, CMA or fax response to [phone].  Sincerely,    [facility]

## 2017-03-12 NOTE — Telephone Encounter (Signed)
Patient did not schedule Colonoscopy when she saw Doug Sou, PA in our office. Patient called back to set up procedure. Clearance has not been asked yet. Will send letter today.

## 2017-03-13 ENCOUNTER — Other Ambulatory Visit: Payer: Self-pay | Admitting: Internal Medicine

## 2017-03-13 DIAGNOSIS — E119 Type 2 diabetes mellitus without complications: Secondary | ICD-10-CM

## 2017-03-20 MED FILL — XARELTO 20 MG TABLET: 20 | 30 days supply | Qty: 30 | Fill #6

## 2017-03-20 MED FILL — METOPROLOL TARTRATE 25 MG T: 25 | 30 days supply | Qty: 90 | Fill #1

## 2017-03-20 MED FILL — LISINOPRIL 10 MG TABLET: 10 | 90 days supply | Qty: 45 | Fill #1

## 2017-03-20 MED FILL — LANTUS SOLOSTAR 100 UNITS/M: 100 | 28 days supply | Qty: 9 | Fill #2

## 2017-03-23 ENCOUNTER — Encounter: Payer: Self-pay | Admitting: Gastroenterology

## 2017-03-23 ENCOUNTER — Ambulatory Visit (AMBULATORY_SURGERY_CENTER): Payer: Self-pay

## 2017-03-23 VITALS — Ht 65.0 in | Wt 254.8 lb

## 2017-03-23 DIAGNOSIS — Z1211 Encounter for screening for malignant neoplasm of colon: Secondary | ICD-10-CM

## 2017-03-23 MED ORDER — NA SULFATE-K SULFATE-MG SULF 17.5-3.13-1.6 GM/177ML PO SOLN
1.0000 | Freq: Once | ORAL | 0 refills | Status: AC
Start: 2017-03-23 — End: 2017-03-23

## 2017-03-23 NOTE — Progress Notes (Signed)
Denies allergies to eggs or soy products. Denies complication of anesthesia or sedation. Denies use of weight loss medication. Denies use of O2.   Emmi instructions given for colonoscopy.  

## 2017-03-25 MED FILL — LOVASTATIN 20 MG TABLET: 20 | 30 days supply | Qty: 30 | Fill #1

## 2017-03-31 ENCOUNTER — Encounter: Payer: Self-pay | Admitting: Gastroenterology

## 2017-03-31 ENCOUNTER — Ambulatory Visit (AMBULATORY_SURGERY_CENTER): Payer: BLUE CROSS/BLUE SHIELD | Admitting: Gastroenterology

## 2017-03-31 VITALS — BP 102/72 | HR 73 | Temp 96.0°F | Resp 11 | Ht 65.5 in | Wt 254.0 lb

## 2017-03-31 DIAGNOSIS — Z1212 Encounter for screening for malignant neoplasm of rectum: Secondary | ICD-10-CM | POA: Diagnosis not present

## 2017-03-31 DIAGNOSIS — Z1211 Encounter for screening for malignant neoplasm of colon: Secondary | ICD-10-CM

## 2017-03-31 MED ORDER — SODIUM CHLORIDE 0.9 % IV SOLN: 500.0000 mL | INTRAVENOUS | Status: DC

## 2017-03-31 NOTE — Op Note (Signed)
[facility] Patient Name: Kelly Lang Procedure Date: 03/31/2017 8:30 AM MRN: [id number] Endoscopist: Meryl Dare , MD Age: 56 Referring MD:  Date of Birth: 1961/08/15 Gender: Female Account #: [id number] Procedure:                Colonoscopy Indications:              Screening for colorectal malignant neoplasm Medicines:                Monitored Anesthesia Care Procedure:                Pre-Anesthesia Assessment:                           - Prior to the procedure, a History and Physical                            was performed, and patient medications and                            allergies were reviewed. The patient's tolerance of                            previous anesthesia was also reviewed. The risks                            and benefits of the procedure and the sedation                            options and risks were discussed with the patient.                            All questions were answered, and informed consent                            was obtained. Prior Anticoagulants: The patient has                            taken Xarelto (rivaroxaban), last dose was 2 days                            prior to procedure. ASA Grade Assessment: III - A                            patient with severe systemic disease. After                            reviewing the risks and benefits, the patient was                            deemed in satisfactory condition to undergo the                            procedure.  After obtaining informed consent, the colonoscope                            was passed under direct vision. Throughout the                            procedure, the patient's blood pressure, pulse, and                            oxygen saturations were monitored continuously. The                            Colonoscope was introduced through the anus and                            advanced to the the cecum, identified by                     appendiceal orifice and ileocecal valve. The                            ileocecal valve, appendiceal orifice, and rectum                            were photographed. The quality of the bowel                            preparation was good. The colonoscopy was performed                            without difficulty. The patient tolerated the                            procedure well. Scope In: 8:42:57 AM Scope Out: 8:51:22 AM Scope Withdrawal Time: 0 hours 7 minutes 31 seconds  Total Procedure Duration: 0 hours 8 minutes 25 seconds  Findings:                 The perianal and digital rectal examinations were                            normal.                           A few large-mouthed diverticula were found in the                            sigmoid colon.                           The exam was otherwise without abnormality on                            direct and retroflexion views. Complications:            No immediate complications. Estimated blood loss:  None. Estimated Blood Loss:     Estimated blood loss: none. Impression:               - Diverticulosis in the sigmoid colon.                           - The examination was otherwise normal on direct                            and retroflexion views.                           - No specimens collected. Recommendation:           - Repeat colonoscopy in 10 years for screening                            purposes.                           - Resume Xarelto (rivaroxaban) today at prior dose.                            Refer to managing physician for further adjustment                            of therapy.                           - Patient has a contact number available for                            emergencies. The signs and symptoms of potential                            delayed complications were discussed with the                            patient. Return to normal activities  tomorrow.                            Written discharge instructions were provided to the                            patient.                           - Continue present medications.                           - High fiber diet indefinitely. Meryl Dare, MD 03/31/2017 8:55:51 AM This report has been signed electronically.

## 2017-03-31 NOTE — Patient Instructions (Signed)
YOU HAD AN ENDOSCOPIC PROCEDURE TODAY AT THE [facility]:   Refer to the procedure report that was given to you for any specific questions about what was found during the examination.  If the procedure report does not answer your questions, please call your gastroenterologist to clarify.  If you requested that your care partner not be given the details of your procedure findings, then the procedure report has been included in a sealed envelope for you to review at your convenience later.  YOU SHOULD EXPECT: Some feelings of bloating in the abdomen. Passage of more gas than usual.  Walking can help get rid of the air that was put into your GI tract during the procedure and reduce the bloating. If you had a lower endoscopy (such as a colonoscopy or flexible sigmoidoscopy) you may notice spotting of blood in your stool or on the toilet paper. If you underwent a bowel prep for your procedure, you may not have a normal bowel movement for a few days.  Please Note:  You might notice some irritation and congestion in your nose or some drainage.  This is from the oxygen used during your procedure.  There is no need for concern and it should clear up in a day or so.  SYMPTOMS TO REPORT IMMEDIATELY:   Following lower endoscopy (colonoscopy or flexible sigmoidoscopy):  Excessive amounts of blood in the stool  Significant tenderness or worsening of abdominal pains  Swelling of the abdomen that is new, acute  Fever of 100F or higher    For urgent or emergent issues, a gastroenterologist can be reached at any hour by calling [phone].   DIET:  We do recommend a small meal at first, but then you may proceed to your regular diet.  Drink plenty of fluids but you should avoid alcoholic beverages for 24 hours.  ACTIVITY:  You should plan to take it easy for the rest of today and you should NOT DRIVE or use heavy machinery until tomorrow (because of the sedation medicines used during the test).     FOLLOW UP: Our staff will call the number listed on your records the next business day following your procedure to check on you and address any questions or concerns that you may have regarding the information given to you following your procedure. If we do not reach you, we will leave a message.  However, if you are feeling well and you are not experiencing any problems, there is no need to return our call.  We will assume that you have returned to your regular daily activities without incident.  If any biopsies were taken you will be contacted by phone or by letter within the next 1-3 weeks.  Please call us at [phone] if you have not heard about the biopsies in 3 weeks.    SIGNATURES/CONFIDENTIALITY: You and/or your care partner have signed paperwork which will be entered into your electronic medical record.  These signatures attest to the fact that that the information above on your After Visit Summary has been reviewed and is understood.  Full responsibility of the confidentiality of this discharge information lies with you and/or your care-partner.   Resume medications. Information given on diverticulosis and high fiber diet. 

## 2017-03-31 NOTE — Progress Notes (Signed)
Pt's states no medical or surgical changes since previsit or office visit. 

## 2017-03-31 NOTE — Progress Notes (Signed)
Report to PACU, RN, vss, BBS= Clear.  

## 2017-04-01 ENCOUNTER — Telehealth: Payer: Self-pay | Admitting: *Deleted

## 2017-04-01 NOTE — Telephone Encounter (Signed)
  Follow up Call-  Call back number 03/31/2017  Post procedure Call Back phone  # [phone]  Permission to leave phone message Yes  Some recent data might be hidden     Patient questions:  Do you have a fever, pain , or abdominal swelling? No. Pain Score  0 *  Have you tolerated food without any problems? Yes.    Have you been able to return to your normal activities? Yes.    Do you have any questions about your discharge instructions: Diet   No. Medications  No. Follow up visit  No.  Do you have questions or concerns about your Care? No.  Actions: * If pain score is 4 or above: No action needed, pain <4.

## 2017-04-07 ENCOUNTER — Other Ambulatory Visit: Payer: Self-pay | Admitting: Internal Medicine

## 2017-04-07 MED FILL — glipiZIDE 5 MG TABS: 5 | 30 days supply | Qty: 60 | Fill #0

## 2017-04-15 MED FILL — LANTUS SOLOSTAR 100 UNITS/M: 100 | 28 days supply | Qty: 9 | Fill #3

## 2017-04-16 ENCOUNTER — Encounter: Payer: Self-pay | Admitting: Internal Medicine

## 2017-04-22 ENCOUNTER — Ambulatory Visit: Payer: BLUE CROSS/BLUE SHIELD | Attending: Internal Medicine | Admitting: Pharmacist

## 2017-04-22 ENCOUNTER — Ambulatory Visit: Payer: BLUE CROSS/BLUE SHIELD | Admitting: Internal Medicine

## 2017-04-22 DIAGNOSIS — Z794 Long term (current) use of insulin: Secondary | ICD-10-CM | POA: Diagnosis not present

## 2017-04-22 DIAGNOSIS — E119 Type 2 diabetes mellitus without complications: Secondary | ICD-10-CM | POA: Insufficient documentation

## 2017-04-22 MED ORDER — INSULIN GLARGINE 100 UNIT/ML SOLOSTAR PEN: 45.0000 [IU] | PEN_INJECTOR | Freq: Every day | SUBCUTANEOUS | 2 refills | Status: DC

## 2017-04-22 MED ORDER — METFORMIN HCL 1000 MG PO TABS
1000.0000 mg | ORAL_TABLET | Freq: Two times a day (BID) | ORAL | 2 refills | Status: DC
Start: 2017-04-22 — End: 2017-12-21

## 2017-04-22 MED ORDER — INSULIN GLARGINE 100 UNIT/ML SOLOSTAR PEN: 45.0000 [IU] | PEN_INJECTOR | Freq: Every day | SUBCUTANEOUS | 3 refills | Status: DC

## 2017-04-22 MED FILL — metFORMIN HCL 1000 MG TABS: 1000 | 30 days supply | Qty: 60 | Fill #0

## 2017-04-22 MED FILL — FLECAINIDE ACETATE 50 MG TA: 50 | 30 days supply | Qty: 120 | Fill #0

## 2017-04-22 NOTE — Progress Notes (Signed)
    S:    Patient arrives in good spirits.  Presents for diabetes evaluation, education, and management at the request of Dr. Julien Nordmann. Patient was referred on 11/19/16.  Patient was last seen by Primary Care Provider on 11/19/16.   Patient reports adherence with medications.  Current diabetes medications include: Lantus 40 units daily, glipizide 5 mg BID, metformin 1000 mg BID  Patient denies hypoglycemic events.  Patient reported dietary habits: doesn't follow a particular diet but tries to avoid carbs  Patient reported exercise habits: none   Patient reports nocturia.  Patient denies neuropathy. Patient denies visual changes. Patient reports self foot exams.   Patient reports that ever since her colonoscopy, she has been unable to control her blood sugars. She is very frustrated and tearful.   O:  Lab Results  Component Value Date   HGBA1C 9.0 11/19/2016   There were no vitals filed for this visit.  Home fasting CBG: 176-240s 2 hour post-prandial/random CBG: 200s-300s   A/P: Diabetes longstanding currently UNcontrolled based on A1c of 9 and home CBGs. Patient denies hypoglycemic events and is able to verbalize appropriate hypoglycemia management plan. Patient reports adherence with medication. Control is suboptimal due to dietary indiscretion and sedentary lifestyle.  Continue metformin 1000 mg BID, glipizide 5 mg BID. Increase Lantus to 45 units. Will not titrate aggressively as patient is looking to go to another practice since she has insurance.  Patient to established with Dr. Laural Benes in 2 weeks for DM follow up. Information given to patient on local clinics accepting new patients since she has insurance now.   Next A1C anticipated March 2018.    Written patient instructions provided.  Total time in face to face counseling 20 minutes.   Follow up in Pharmacist Clinic Visit in 2 weeks.   Patient seen with Susy Frizzle, PharmD Candidate

## 2017-04-22 NOTE — Patient Instructions (Signed)
Thanks for coming to see [place]  Increase Lantus to 45 units daily  Schedule appt with new primary care provider.

## 2017-04-28 MED FILL — XARELTO 20 MG TABLET: 20 | 30 days supply | Qty: 30 | Fill #7 | Status: TO

## 2017-04-28 MED FILL — LOVASTATIN 20 MG TABLET: 20 | 30 days supply | Qty: 30 | Fill #2

## 2017-04-28 MED FILL — METOPROLOL TARTRATE 25 MG T: 25 | 30 days supply | Qty: 90 | Fill #2

## 2017-04-29 ENCOUNTER — Encounter: Payer: Self-pay | Admitting: Cardiovascular Disease

## 2017-05-11 ENCOUNTER — Encounter: Payer: Self-pay | Admitting: Internal Medicine

## 2017-05-11 ENCOUNTER — Other Ambulatory Visit (HOSPITAL_COMMUNITY)
Admission: RE | Admit: 2017-05-11 | Discharge: 2017-05-11 | Disposition: A | Discharge status: Home / Self Care | Payer: BLUE CROSS/BLUE SHIELD | Source: Ambulatory Visit | Attending: Internal Medicine | Admitting: Internal Medicine

## 2017-05-11 ENCOUNTER — Ambulatory Visit: Payer: BLUE CROSS/BLUE SHIELD | Attending: Internal Medicine | Admitting: Internal Medicine

## 2017-05-11 VITALS — BP 128/74 | HR 80 | Temp 98.1°F | Resp 16 | Wt 256.2 lb

## 2017-05-11 DIAGNOSIS — M79602 Pain in left arm: Secondary | ICD-10-CM

## 2017-05-11 DIAGNOSIS — Z87891 Personal history of nicotine dependence: Secondary | ICD-10-CM | POA: Diagnosis not present

## 2017-05-11 DIAGNOSIS — E039 Hypothyroidism, unspecified: Secondary | ICD-10-CM | POA: Insufficient documentation

## 2017-05-11 DIAGNOSIS — M79601 Pain in right arm: Secondary | ICD-10-CM

## 2017-05-11 DIAGNOSIS — E119 Type 2 diabetes mellitus without complications: Secondary | ICD-10-CM | POA: Diagnosis not present

## 2017-05-11 DIAGNOSIS — Z6841 Body Mass Index (BMI) 40.0 and over, adult: Secondary | ICD-10-CM

## 2017-05-11 DIAGNOSIS — Z8249 Family history of ischemic heart disease and other diseases of the circulatory system: Secondary | ICD-10-CM | POA: Insufficient documentation

## 2017-05-11 DIAGNOSIS — Z794 Long term (current) use of insulin: Secondary | ICD-10-CM

## 2017-05-11 DIAGNOSIS — IMO0002 Reserved for concepts with insufficient information to code with codable children: Secondary | ICD-10-CM

## 2017-05-11 DIAGNOSIS — Z124 Encounter for screening for malignant neoplasm of cervix: Secondary | ICD-10-CM

## 2017-05-11 DIAGNOSIS — Z8673 Personal history of transient ischemic attack (TIA), and cerebral infarction without residual deficits: Secondary | ICD-10-CM | POA: Diagnosis not present

## 2017-05-11 DIAGNOSIS — M1990 Unspecified osteoarthritis, unspecified site: Secondary | ICD-10-CM | POA: Diagnosis not present

## 2017-05-11 DIAGNOSIS — M79604 Pain in right leg: Secondary | ICD-10-CM | POA: Diagnosis not present

## 2017-05-11 DIAGNOSIS — Z79899 Other long term (current) drug therapy: Secondary | ICD-10-CM | POA: Insufficient documentation

## 2017-05-11 DIAGNOSIS — M79605 Pain in left leg: Secondary | ICD-10-CM | POA: Diagnosis not present

## 2017-05-11 DIAGNOSIS — I10 Essential (primary) hypertension: Secondary | ICD-10-CM | POA: Diagnosis not present

## 2017-05-11 DIAGNOSIS — Z7901 Long term (current) use of anticoagulants: Secondary | ICD-10-CM | POA: Diagnosis not present

## 2017-05-11 DIAGNOSIS — I110 Hypertensive heart disease with heart failure: Secondary | ICD-10-CM | POA: Diagnosis not present

## 2017-05-11 DIAGNOSIS — E559 Vitamin D deficiency, unspecified: Secondary | ICD-10-CM | POA: Diagnosis not present

## 2017-05-11 DIAGNOSIS — I5022 Chronic systolic (congestive) heart failure: Secondary | ICD-10-CM | POA: Insufficient documentation

## 2017-05-11 DIAGNOSIS — I4892 Unspecified atrial flutter: Secondary | ICD-10-CM | POA: Diagnosis not present

## 2017-05-11 DIAGNOSIS — E785 Hyperlipidemia, unspecified: Secondary | ICD-10-CM | POA: Diagnosis not present

## 2017-05-11 DIAGNOSIS — Z885 Allergy status to narcotic agent status: Secondary | ICD-10-CM | POA: Diagnosis not present

## 2017-05-11 DIAGNOSIS — E118 Type 2 diabetes mellitus with unspecified complications: Secondary | ICD-10-CM | POA: Diagnosis not present

## 2017-05-11 DIAGNOSIS — E1165 Type 2 diabetes mellitus with hyperglycemia: Secondary | ICD-10-CM

## 2017-05-11 LAB — GLUCOSE, POCT (MANUAL RESULT ENTRY): POC GLUCOSE: 206 mg/dL — AB (ref 70–99)

## 2017-05-11 LAB — POCT GLYCOSYLATED HEMOGLOBIN (HGB A1C): HEMOGLOBIN A1C: 8.2

## 2017-05-11 MED ORDER — DAPAGLIFLOZIN PROPANEDIOL 5 MG PO TABS: 5.0000 mg | ORAL_TABLET | Freq: Every day | ORAL | 5 refills | Status: DC

## 2017-05-11 MED ORDER — GLIPIZIDE 5 MG PO TABS: 5.0000 mg | ORAL_TABLET | Freq: Two times a day (BID) | ORAL | 11 refills | Status: DC

## 2017-05-11 MED ORDER — DICLOFENAC SODIUM 1 % TD GEL: 2.0000 g | Freq: Four times a day (QID) | TRANSDERMAL | 0 refills | Status: DC

## 2017-05-11 MED FILL — LANTUS SOLOSTAR 100 UNITS/M: 100 | 20 days supply | Qty: 9 | Fill #4

## 2017-05-11 NOTE — Patient Instructions (Signed)
Your diabetes is not controlled.   We discussed adding Farxiga to help get your blood sugars better.    Dapagliflozin tablets What is this medicine? DAPAGLIFLOZIN (DAP a gli FLOE zin) helps to treat type 2 diabetes. It helps to control blood sugar. Treatment is combined with diet and exercise. This medicine may be used for other purposes; ask your health care provider or pharmacist if you have questions. COMMON BRAND NAME(S): Marcelline Deist What should I tell my health care provider before I take this medicine? They need to know if you have any of these conditions: -bladder cancer -dehydration -diabetic ketoacidosis -diet low in salt -eating less due to illness, surgery, dieting, or any other reason -having surgery -high cholesterol -history of pancreatitis or pancreas problems -history of yeast infection of the penis or vagina -if you often drink alcohol -infections in the bladder, kidneys, or urinary tract -kidney disease -low blood pressure -on hemodialysis -problems urinating -type 1 diabetes -uncircumcised female -an unusual or allergic reaction to dapagliflozin, other medicines, foods, dyes, or preservatives -pregnant or trying to get pregnant -breast-feeding How should I use this medicine? Take this medicine by mouth with a glass of water. Follow the directions on the prescription label. You can take it with or without food. If it upsets your stomach, take it with food. Take this medicine in the morning. Take your dose at the same time each day. Do not take more often than directed. Do not stop taking except on your doctor's advice. A special MedGuide will be given to you by the pharmacist with each prescription and refill. Be sure to read this information carefully each time. Talk to your pediatrician regarding the use of this medicine in children. Special care may be needed. Overdosage: If you think you have taken too much of this medicine contact a poison control center or  emergency room at once. NOTE: This medicine is only for you. Do not share this medicine with others. What if I miss a dose? If you miss a dose, take it as soon as you can. If it is almost time for your next dose, take only that dose. Do not take double or extra doses. What may interact with this medicine? Do not take this medicine with any of the following medications: -gatifloxacin This medicine may also interact with the following medications: -alcohol -certain medicines for blood pressure, heart disease -diuretics -insulin -nateglinide -pioglitazone -quinolone antibiotics like ciprofloxacin, levofloxacin, ofloxacin -repaglinide -some herbal dietary supplements -steroid medicines like prednisone or cortisone -sulfonylureas like glimepiride, glipizide, glyburide -thyroid medicine This list may not describe all possible interactions. Give your health care provider a list of all the medicines, herbs, non-prescription drugs, or dietary supplements you use. Also tell them if you smoke, drink alcohol, or use illegal drugs. Some items may interact with your medicine. What should I watch for while using this medicine? Visit your doctor or health care professional for regular checks on your progress. This medicine can cause a serious condition in which there is too much acid in the blood. If you develop nausea, vomiting, stomach pain, unusual tiredness, or breathing problems, stop taking this medicine and call your doctor right away. If possible, use a ketone dipstick to check for ketones in your urine. A test called the HbA1C (A1C) will be monitored. This is a simple blood test. It measures your blood sugar control over the last 2 to 3 months. You will receive this test every 3 to 6 months. Learn how to check your blood  sugar. Learn the symptoms of low and high blood sugar and how to manage them. Always carry a quick-source of sugar with you in case you have symptoms of low blood sugar. Examples  include hard sugar candy or glucose tablets. Make sure others know that you can choke if you eat or drink when you develop serious symptoms of low blood sugar, such as seizures or unconsciousness. They must get medical help at once. Tell your doctor or health care professional if you have high blood sugar. You might need to change the dose of your medicine. If you are sick or exercising more than usual, you might need to change the dose of your medicine. Do not skip meals. Ask your doctor or health care professional if you should avoid alcohol. Many nonprescription cough and cold products contain sugar or alcohol. These can affect blood sugar. Wear a medical ID bracelet or chain, and carry a card that describes your disease and details of your medicine and dosage times. What side effects may I notice from receiving this medicine? Side effects that you should report to your doctor or health care professional as soon as possible: -allergic reactions like skin rash, itching or hives, swelling of the face, lips, or tongue -breathing problems -dizziness -feeling faint or lightheaded, falls -muscle weakness -nausea, vomiting, unusual stomach upset or pain -signs and symptoms of low blood sugar such as feeling anxious, confusion, dizziness, increased hunger, unusually weak or tired, sweating, shakiness, cold, irritable, headache, blurred vision, fast heartbeat, loss of consciousness -signs and symptoms of a urinary tract infection, such as fever, chills, a burning feeling when urinating, blood in the urine, back pain -trouble passing urine or change in the amount of urine, including an urgent need to urinate more often, in larger amounts, or at night -penile discharge, itching, or pain in men -unusual tiredness -vaginal discharge, itching, or odor in women Side effects that usually do not require medical attention (report to your doctor or health care professional if they continue or are  bothersome): -constipation -mild increase in urination -stuffy or runny nose -sore throat -thirsty This list may not describe all possible side effects. Call your doctor for medical advice about side effects. You may report side effects to FDA at 1-800-FDA-1088. Where should I keep my medicine? Keep out of the reach of children. Store at room temperature between 15 and 30 degrees C (59 and 86 degrees F). Throw away any unused medicine after the expiration date. NOTE: This sheet is a summary. It may not cover all possible information. If you have questions about this medicine, talk to your doctor, pharmacist, or health care provider.  2018 [employer]/Gold Standard (2015-12-20 08:46:40)

## 2017-05-11 NOTE — Addendum Note (Signed)
Addended by: Jonah Blue B on: 05/11/2017 01:45 PM   Modules accepted: Orders

## 2017-05-11 NOTE — Progress Notes (Signed)
Patient ID: Kelly Lang, female    DOB: Feb 28, 1961  MRN: [id number]  CC: Re-estbalish; Diabetes; Gynecologic Exam; and Generalized Body Aches   Subjective: Kelly Lang is a 56 y.o. female who presents for f/u Her concerns today include:  1. DM: On Lantus 50 units.  BS BID - a.m range 170-200, before dinner 120-140 -BS increased after having colonoscopy 1-2 mths ago without doing anything different.  BS was in 90-130 prior to this. -Eating habit: she tries to be careful -gained 20 lbs over past 1 yr -not getting in much exercise due to long work hrs and "I ache in my legs and arms."  12 hr days. Has a pool that is about to open. Plans to swim and walk in it 4-5 x a wk.  -aching in legs and arms x few mths. "Tylenol does not work for me any more.  I do better with Ibuprofen or Aleve.  I know I have been told not to due to Xarelto." -no numbness or tingling except in both anterior thigh which she has had for a while  3.  A.flutter -followed by Dr. Katharina Caper.  Will see him next wk   4.  PAP -no abnromals No dischg  Patient Active Problem List   Diagnosis Date Noted  . Special screening for malignant neoplasms, colon 01/19/2017  . Cardiomyopathy ([place]) 09/16/2015  . Chronic systolic CHF (congestive heart failure) ([place]) 09/16/2015  . Cerebrovascular accident (CVA) due to embolism of left middle cerebral artery ([place])   . Thyroid mass 09/15/2015  . Embolic infarction ([place]) 09/14/2015  . Expressive aphasia 09/14/2015  . Hyperglycemia   . Low back pain 04/03/2014  . Unspecified vitamin D deficiency 01/20/2014  . Essential hypertension, benign 01/20/2014  . Encounter for therapeutic drug monitoring 12/22/2013  . Chronic anticoagulation 10/31/2013  . Tachycardia induced cardiomyopathy ([place])   . H/O: hypothyroidism   . Obesity   . IDDM (insulin dependent diabetes mellitus) ([place])   . Arthritis   . Dyslipidemia   . Atrial fibrillation  10/25/2013     Current Outpatient  Prescriptions on File Prior to Visit  Medication Sig Dispense Refill  . acetaminophen (TYLENOL) 500 MG tablet Take 1,000 mg by mouth at bedtime as needed (pain).    . Blood Glucose Monitoring Suppl (ONE TOUCH ULTRA 2) w/Device KIT 1 application by Does not apply route 2 (two) times daily. 1 each 0  . flecainide (TAMBOCOR) 100 MG tablet Take 1 tablet (100 mg total) by mouth 2 (two) times daily. 60 tablet 11  . glipiZIDE (GLUCOTROL) 5 MG tablet TAKE 1 TABLET BY MOUTH 2 TIMES DAILY BEFORE A MEAL. 60 tablet 0  . glucose blood (ONE TOUCH TEST STRIPS) test strip Use as instructed 100 each 12  . Insulin Glargine (LANTUS SOLOSTAR) 100 UNIT/ML Solostar Pen Inject 45 Units into the skin daily at 10 pm. 15 mL 2  . Insulin Pen Needle (ULTICARE MICRO PEN NEEDLES) 32G X 4 MM MISC 1 applicator by Does not apply route at bedtime. 100 each 2  . Lancets (ONETOUCH ULTRASOFT) lancets Use as instructed 100 each 12  . lisinopril (PRINIVIL,ZESTRIL) 10 MG tablet Take 0.5 tablets (5 mg total) by mouth daily. 90 tablet 3  . lovastatin (MEVACOR) 20 MG tablet Take 1 tablet (20 mg total) by mouth at bedtime. 90 tablet 3  . metFORMIN (GLUCOPHAGE) 1000 MG tablet Take 1 tablet (1,000 mg total) by mouth 2 (two) times daily with a meal. 60 tablet 2  .  metoprolol tartrate (LOPRESSOR) 25 MG tablet TAKE 1 & 1/2 TABLET BY MOUTH 2 TIMES DAILY 90 tablet 6  . rivaroxaban (XARELTO) 20 MG TABS tablet Take 1 tablet (20 mg total) by mouth daily with supper. 30 tablet 11   Current Facility-Administered Medications on File Prior to Visit  Medication Dose Route Frequency Provider Last Rate Last Dose  . 0.9 %  sodium chloride infusion  500 mL Intravenous Continuous Ladene Artist, MD        Allergies  Allergen Reactions  . Codeine Itching and Rash    Social History   Social History  . Marital status: Divorced    Spouse name: N/A  . Number of children: 0  . Years of education: 14   Occupational History  . banquet coordinator     Social History Main Topics  . Smoking status: Former Smoker    Packs/day: 1.00    Years: 22.00    Types: Cigarettes    Quit date: 12/01/2010  . Smokeless tobacco: Never Used  . Alcohol use Yes     Comment: 10/25/2013 "glass of wine maybe q other month"  . Drug use: No  . Sexual activity: Not Currently   Other Topics Concern  . Not on file   Social History Narrative   Moved to [place] from [place] in 2002. She has only seen one medical doctor in that time period when she had insurance. 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   Fun: shop, sew, decorate   Denies any beliefs effecting healthcare    Family History  Problem Relation Age of Onset  . Adopted: Yes  . Hypertension Mother   . Hyperlipidemia Mother     Past Surgical History:  Procedure Laterality Date  . BIOPSY THYROID     1996  . CARDIOVERSION N/A 12/07/2013   Procedure: CARDIOVERSION;  Surgeon: Casandra Doffing, MD;  [facility] END[facility]COPY;  Service: Cardiovascular;  Laterality: N/A;  . CARDIOVERSION N/A 03/10/2016   Procedure: CARDIOVERSION;  Surgeon: Thayer Headings, MD;  Location: [facility];  Service: Cardiovascular;  Laterality: N/A;  . EYE MUSCLE SURGERY Right ~ 1966  . TONSILLECTOMY  1971    ROS: Review of Systems As stated above PHYSICAL EXAM: BP 128/74   Pulse 80   Temp 98.1 F (36.7 C) (Oral)   Resp 16   Wt 256 lb 3.2 oz (116.2 kg)   SpO2 97%   BMI 41.99 kg/m   Wt Readings from Last 3 Encounters:  05/11/17 256 lb 3.2 oz (116.2 kg)  03/31/17 254 lb (115.2 kg)  03/23/17 254 lb 12.8 oz (115.6 kg)   Physical Exam General appearance - alert, well appearing, obese middle-age Caucasian female and in no distress Mental status - alert, oriented to person, place, and time, normal mood, behavior, speech, dress, motor activity, and thought processes Neck - supple, no significant adenopathy Chest - clear to auscultation, no wheezes, rales or rhonchi, symmetric air  entry Heart - normal rate, regular rhythm, normal S1, S2, no murmurs, rubs, clicks or gallops Pelvic, my CMA [place] is present - normal external genitalia, vulva, vagina, cervix, uterus and adnexa.  Small amount of white discharge around cervix Extremities - peripheral pulses normal, no pedal edema, no clubbing or cyanosis Small 2 cm raised, soft moveable mass LT lower leg above lateral malleolus.  Patient states this has been there for quite a while without change.  Results for orders placed or performed in visit on 05/11/17  POCT  glucose (manual entry)  Result Value Ref Range   POC Glucose 206 (A) 70 - 99 mg/dl  POCT glycosylated hemoglobin (Hb A1C)  Result Value Ref Range   Hemoglobin A1C 8.2     Depression screen [facility] 2/9 05/11/2017  Decreased Interest 0  Down, Depressed, Hopeless 0  PHQ - 2 Score 0  Altered sleeping -  Tired, decreased energy -  Change in appetite -  Feeling bad or failure about yourself  -  Trouble concentrating -  Moving slowly or fidgety/restless -  Suicidal thoughts -  PHQ-9 Score -   GAD 7 : Generalized Anxiety Score 05/11/2017 11/19/2016 06/16/2016 02/21/2016  Nervous, Anxious, on Edge 0 0 0 0  Control/stop worrying 0 0 0 0  Worry too much - different things 0 0 0 1  Trouble relaxing 0 0 0 1  Restless 0 0 0 0  Easily annoyed or irritable 0 0 0 1  Afraid - awful might happen 0 0 0 0  Total GAD 7 Score 0 0 0 3  Anxiety Difficulty - - Not difficult at all -     ASSESSMENT AND PLAN: 1. Uncontrolled type 2 diabetes mellitus with complication, with long-term current use of insulin (HCC) -Some of the weight gain may be due to insulin itself. We discuss adding Victoza or [place].  However she does have a history of thyroid disease in the past so we decided to go with the [place].  I went over possible side effects of the medication including increased urinary tract infection and vaginal yeast infection. She will let me know if these happen. -Encourage her to  start the exercise pool program -Continue healthy eating habits - POCT glucose (manual entry) - POCT glycosylated hemoglobin (Hb A1C) - CBC - Comprehensive metabolic panel - dapagliflozin propanediol (FARXIGA) 5 MG TABS tablet; Take 5 mg by mouth daily.  Dispense: 30 tablet; Refill: 5  2. Essential hypertension At goal  3. Class 3 severe obesity due to excess calories with serious comorbidity and body mass index (BMI) of 40.0 to 44.9 in adult [facility]) -See plan above  4. Pap smear for cervical cancer screening - Cytology - PAP  5. Pain in both upper extremities - diclofenac sodium (VOLTAREN) 1 % GEL; Apply 2 g topically 4 (four) times daily.  Dispense: 100 g; Refill: 0  6. Pain in both lower extremities -Do not use NSAIDs while on Xarelto - diclofenac sodium (VOLTAREN) 1 % GEL; Apply 2 g topically 4 (four) times daily.  Dispense: 100 g; Refill: 0   Patient was given the opportunity to ask questions.  Patient verbalized understanding of the plan and was able to repeat key elements of the plan.   Orders Placed This Encounter  Procedures  . CBC  . Comprehensive metabolic panel  . POCT glucose (manual entry)  . POCT glycosylated hemoglobin (Hb A1C)     Requested Prescriptions   Signed Prescriptions Disp Refills  . dapagliflozin propanediol (FARXIGA) 5 MG TABS tablet 30 tablet 5    Sig: Take 5 mg by mouth daily.  . diclofenac sodium (VOLTAREN) 1 % GEL 100 g 0    Sig: Apply 2 g topically 4 (four) times daily.    Return in about 2 months (around 07/11/2017).  Karle Plumber, MD, FACP

## 2017-05-12 ENCOUNTER — Other Ambulatory Visit: Payer: Self-pay | Admitting: Internal Medicine

## 2017-05-12 ENCOUNTER — Other Ambulatory Visit: Payer: Self-pay | Admitting: Pharmacist

## 2017-05-12 ENCOUNTER — Encounter: Payer: Self-pay | Admitting: Pharmacist

## 2017-05-12 ENCOUNTER — Other Ambulatory Visit: Payer: Self-pay

## 2017-05-12 DIAGNOSIS — M79601 Pain in right arm: Secondary | ICD-10-CM

## 2017-05-12 DIAGNOSIS — M79604 Pain in right leg: Secondary | ICD-10-CM

## 2017-05-12 DIAGNOSIS — M79605 Pain in left leg: Secondary | ICD-10-CM

## 2017-05-12 DIAGNOSIS — M79602 Pain in left arm: Principal | ICD-10-CM

## 2017-05-12 LAB — CBC
Hematocrit: 42.6 % (ref 34.0–46.6)
Hemoglobin: 14.5 g/dL (ref 11.1–15.9)
MCH: 32.5 pg (ref 26.6–33.0)
MCHC: 34 g/dL (ref 31.5–35.7)
MCV: 96 fL (ref 79–97)
PLATELETS: 256 10*3/uL (ref 150–379)
RBC: 4.46 x10E6/uL (ref 3.77–5.28)
RDW: 13.7 % (ref 12.3–15.4)
WBC: 7.8 10*3/uL (ref 3.4–10.8)

## 2017-05-12 LAB — CYTOLOGY - PAP
DIAGNOSIS: NEGATIVE
HPV: NOT DETECTED

## 2017-05-12 LAB — COMPREHENSIVE METABOLIC PANEL
ALT: 25 IU/L (ref 0–32)
AST: 17 IU/L (ref 0–40)
Albumin/Globulin Ratio: 1.4 (ref 1.2–2.2)
Albumin: 4.1 g/dL (ref 3.5–5.5)
Alkaline Phosphatase: 65 IU/L (ref 39–117)
BUN/Creatinine Ratio: 14 (ref 9–23)
BUN: 9 mg/dL (ref 6–24)
Bilirubin Total: 0.3 mg/dL (ref 0.0–1.2)
CALCIUM: 9.1 mg/dL (ref 8.7–10.2)
CO2: 23 mmol/L (ref 20–29)
CREATININE: 0.65 mg/dL (ref 0.57–1.00)
Chloride: 100 mmol/L (ref 96–106)
GFR calc Af Amer: 116 mL/min/{1.73_m2} (ref >59)
GFR, EST NON AFRICAN AMERICAN: 101 mL/min/{1.73_m2} (ref >59)
GLOBULIN, TOTAL: 3 g/dL (ref 1.5–4.5)
Glucose: 170 mg/dL — ABNORMAL HIGH (ref 65–99)
Potassium: 4.8 mmol/L (ref 3.5–5.2)
Sodium: 136 mmol/L (ref 134–144)
Total Protein: 7.1 g/dL (ref 6.0–8.5)

## 2017-05-12 LAB — CERVICOVAGINAL ANCILLARY ONLY: Wet Prep (BD Affirm): POSITIVE — AB

## 2017-05-12 MED ORDER — METRONIDAZOLE 500 MG PO TABS: 500.0000 mg | ORAL_TABLET | Freq: Two times a day (BID) | ORAL | 0 refills | Status: DC

## 2017-05-12 MED ORDER — DICLOFENAC SODIUM 1 % TD GEL
2.0000 g | Freq: Four times a day (QID) | TRANSDERMAL | 0 refills | Status: DC
Start: 2017-05-12 — End: 2017-08-05

## 2017-05-12 MED ORDER — EMPAGLIFLOZIN 10 MG PO TABS: 10.0000 mg | ORAL_TABLET | Freq: Every day | ORAL | 5 refills | Status: DC

## 2017-05-12 MED ORDER — GLIPIZIDE 5 MG PO TABS: 5.0000 mg | ORAL_TABLET | Freq: Two times a day (BID) | ORAL | 11 refills | Status: DC

## 2017-05-12 MED FILL — JARDIANCE 10 MG TABLET: 10 | 30 days supply | Qty: 30 | Fill #0

## 2017-05-12 NOTE — Progress Notes (Signed)
Flagyl for bacterial vaginosis.

## 2017-05-12 NOTE — Telephone Encounter (Signed)
Farxiga replaced with London Pepper as [place] Pepper is preferred by patient's insurance

## 2017-05-12 NOTE — Progress Notes (Signed)
Prior authorization completed for diclofenac gel. Submitted to [facility]. Approved.  Request Key: [id number] PA created on: 2017-05-11 12:27:33 -0400

## 2017-05-13 MED FILL — metroNIDAZOLE 500 MG TABS: 500 | 7 days supply | Qty: 14 | Fill #0

## 2017-05-18 ENCOUNTER — Encounter: Payer: Self-pay | Admitting: Cardiovascular Disease

## 2017-05-18 ENCOUNTER — Ambulatory Visit (INDEPENDENT_AMBULATORY_CARE_PROVIDER_SITE_OTHER): Payer: BLUE CROSS/BLUE SHIELD | Admitting: Cardiovascular Disease

## 2017-05-18 VITALS — BP 110/60 | HR 74 | Ht 65.5 in | Wt 251.0 lb

## 2017-05-18 DIAGNOSIS — I4891 Unspecified atrial fibrillation: Secondary | ICD-10-CM | POA: Diagnosis not present

## 2017-05-18 DIAGNOSIS — I5022 Chronic systolic (congestive) heart failure: Secondary | ICD-10-CM

## 2017-05-18 MED ORDER — FLECAINIDE ACETATE 100 MG PO TABS: 100.0000 mg | ORAL_TABLET | Freq: Two times a day (BID) | ORAL | 11 refills | Status: DC

## 2017-05-18 MED FILL — FLECAINIDE ACETATE 100 MG T: 100 | 30 days supply | Qty: 60 | Fill #0

## 2017-05-18 NOTE — Progress Notes (Signed)
Cardiology Office Note   Date:  05/18/2017   ID:  Kelly Lang, DOB June 28, 1961, MRN [id number]  PCP:  Ladell Pier, MD  Cardiologist:   Mertie Moores, MD   Chief Complaint  Patient presents with  . Follow-up    atrial fib   1. Paroxysmal Atrial fibrillation 2. Hypothyroidism 3. Type 2 diabetes mellitus 4. Chronic systolic CHF    Kelly Lang is a 56 y.o. female with a hx of T2DM, HL, thyroid disease and atrial fibrillation. She had been on Synthroid for some time after a thyroid bx in 1996. She was admitted 10/2013 with AFib with RVR. Echo (10/26/13): EF 40-45%, normal wall motion, mild LAE. CHADS2-VASc= 3 (LV dysfunction, DM, female). She was placed on coumadin. 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 She was last seen 11/29/2013. She was set up for DCCV after 3-4 weeks of appropriate anticoagulation. DCCV was performed 12/07/2013 but was unsuccessful. She returns for follow up.  She is stable. She is frustrated with her atrial fibrillation. She does get tired more easily. She denies chest pain. She denies significant dyspnea. She is NYHA class II. She denies syncope. She denies orthopnea, PND, edema, palpitations.  Feb. 16, 2015:  She had a cardioversion twice. Converted back to Afib both times. She has had a stress myoview which was negative for ischemia. we will start flecainide today.  February 15, 2014:  Kelly Lang returns for further evaluation of her atrial fib. She has been on Flecainide 100 BID for a month. She started feeling better about 2 weeks ago. She has converted to NSR. 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   Sept. 16, 2015:  Kelly Lang is doing well. 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    February 13, 2015:  Kelly Lang is a 56 y.o. female who presents for follow-up of atrial fibrillation. Is on coumadin . INR is 2.0 this am.   No CP or dyspnea.     Glucose levels are ok.   HbA1C is a bit high . Was started on Tragenta. Only a little bit of exercise.   Missed 4 days of flecainide due to insurance not paying for it. Staying in NSR  Nov. 29, 2016:  Had a CVA in Oct, 2016.  Had not been on her coumadin and all of her medications.  Echo revealed severe LV dysfunction with EF of 20-25%.   Was started on Xarelto and metoprolol at that time   Has had some some chills and achy feeling for the past couple of days. No cough, no fever, no runny nose.  Cannot tell when she has atrial fib Not Exercising regularly  Works in [employer]	[place] of a hotel and also in [employer] room  Glucose levels are ok  Sleeping ok .     February 08, 2016:  Kelly Lang has been back on her meds.   She had stopped her medications and had developed worsening congestive heart failure. Previous echocardiogram revealed an EF of 25%. She restart her medications and the recent echo 2 days ago reveals recovery of her left ventricular systolic function to an ejection fraction 45% which is her normal.  She's feeling quite a bit better. She has taken flecainide in the past which helped her stay in normal sinus rhythm. She stopped the flecainide due to lack of insurance. She now has [employer] and wants to get back on flecainide.  March 07, 2016:  Kelly Lang is seen back today for follow-up of her atrial fibrillation. She's been on flecainide 100 mg twice a day.  She's had her stress test for flecainide testing and it revealed no QRS widening. She had an unsuccessful cardioversion Jan. 7, 2015 She cannot tell how much she is in NSR vs. Afib.   Oct. 9, 2017:  She is s/p cardioversion in April She is feeling better.  Able to walk without any dyspnea   May 18, 2017:  Kelly Lang is seen back today for follow up  No CP or dyspnea  Is now on Jardiance for the past week    Past Medical History:  Diagnosis Date  . Allergy   . Arthritis   . Atrial fibrillation with  RVR ([place])    a diagnosed 10/25/2013  . Chicken pox   . Diabetes mellitus ([place])    a. hg A1c 10, newly diagnosed (10/26/13)  . Dyslipidemia   . H/O: hypothyroidism    a. thyroid biopsy 1996 with synthroid. Stopped taking rx and was loss to follow up b. normal thyroid function 10/20/13  . Hx of cardiovascular stress test    ETT/Lexiscan Myoview (12/2013):  No ischemia; not gated; low risk.  Marland Kitchen Hypertension   . Obesity   . Stroke ([place])   . 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 2/2 Afib with RVR for unknown duration (EF: 40-45%)  . Thyroid disease     Past Surgical History:  Procedure Laterality Date  . BIOPSY THYROID     1996  . CARDIOVERSION N/A 12/07/2013   Procedure: CARDIOVERSION;  Surgeon: Casandra Doffing, MD;  Location: [facility] ENDOSCOPY;  Service: Cardiovascular;  Laterality: N/A;  . CARDIOVERSION N/A 03/10/2016   Procedure: CARDIOVERSION;  Surgeon: Thayer Headings, MD;  Location: [facility];  Service: Cardiovascular;  Laterality: N/A;  . EYE MUSCLE SURGERY Right ~ 1966  . TONSILLECTOMY  1971     Current Outpatient Prescriptions  Medication Sig Dispense Refill  . acetaminophen (TYLENOL) 500 MG tablet Take 1,000 mg by mouth at bedtime as needed (pain).    . Blood Glucose Monitoring Suppl (ONE TOUCH ULTRA 2) w/Device KIT 1 application by Does not apply route 2 (two) times daily. 1 each 0  . diclofenac sodium (VOLTAREN) 1 % GEL Apply 2 g topically 4 (four) times daily. 100 g 0  . empagliflozin (JARDIANCE) 10 MG TABS tablet Take 10 mg by mouth daily. 30 tablet 5  . flecainide (TAMBOCOR) 100 MG tablet Take 1 tablet (100 mg total) by mouth 2 (two) times daily. 60 tablet 11  . glipiZIDE (GLUCOTROL) 5 MG tablet Take 1 tablet (5 mg total) by mouth 2 (two) times daily before a meal. 60 tablet 11  . glucose blood (ONE TOUCH TEST STRIPS) test strip Use as instructed 100 each 12  . Insulin Glargine (LANTUS SOLOSTAR) 100 UNIT/ML Solostar Pen Inject 45 Units into the skin daily at 10 pm.  15 mL 2  . Insulin Pen Needle (ULTICARE MICRO PEN NEEDLES) 32G X 4 MM MISC 1 applicator by Does not apply route at bedtime. 100 each 2  . Lancets (ONETOUCH ULTRASOFT) lancets Use as instructed 100 each 12  . lisinopril (PRINIVIL,ZESTRIL) 10 MG tablet Take 0.5 tablets (5 mg total) by mouth daily. 90 tablet 3  . lovastatin (MEVACOR) 20 MG tablet Take 1 tablet (20 mg total) by mouth at bedtime. 90 tablet 3  . metFORMIN (GLUCOPHAGE) 1000 MG tablet Take 1 tablet (1,000 mg total) by  mouth 2 (two) times daily with a meal. 60 tablet 2  . metoprolol tartrate (LOPRESSOR) 25 MG tablet TAKE 1 & 1/2 TABLET BY MOUTH 2 TIMES DAILY 90 tablet 6  . metroNIDAZOLE (FLAGYL) 500 MG tablet Take 1 tablet (500 mg total) by mouth 2 (two) times daily. 14 tablet 0  . rivaroxaban (XARELTO) 20 MG TABS tablet Take 1 tablet (20 mg total) by mouth daily with supper. 30 tablet 11   Current Facility-Administered Medications  Medication Dose Route Frequency Provider Last Rate Last Dose  . 0.9 %  sodium chloride infusion  500 mL Intravenous Continuous Ladene Artist, MD        Allergies:   Codeine    Social History:  The patient  reports that she quit smoking about 6 years ago. Her smoking use included Cigarettes. She has a 22.00 pack-year smoking history. She has never used smokeless tobacco. She reports that she drinks alcohol. She reports that she does not use drugs.   Family History:  The patient's family history includes Hyperlipidemia in her mother; Hypertension in her mother. She was adopted.    ROS:  Please see the history of present illness.    Review of Systems: Constitutional:  denies fever, chills, diaphoresis, appetite change and fatigue.  HEENT: denies photophobia, eye pain, redness, hearing loss, ear pain, congestion, sore throat, rhinorrhea, sneezing, neck pain, neck stiffness and tinnitus.  Respiratory: denies SOB, DOE, cough, chest tightness, and wheezing.  Cardiovascular: denies chest pain,  palpitations and leg swelling.  Gastrointestinal: denies nausea, vomiting, abdominal pain, diarrhea, constipation, blood in stool.  Genitourinary: denies dysuria, urgency, frequency, hematuria, flank pain and difficulty urinating.  Musculoskeletal: denies  myalgias, back pain, joint swelling, arthralgias and gait problem.   Skin: denies pallor, rash and wound.  Neurological: denies dizziness, seizures, syncope, weakness, light-headedness, numbness and headaches.   Hematological: denies adenopathy, easy bruising, personal or family bleeding history.  Psychiatric/ Behavioral: denies suicidal ideation, mood changes, confusion, nervousness, sleep disturbance and agitation.       All other systems are reviewed and negative.    PHYSICAL EXAM: VS:  BP 110/60   Pulse 74   Ht 5' 5.5" (1.664 m)   Wt 251 lb (113.9 kg)   BMI 41.13 kg/m  , BMI Body mass index is 41.13 kg/m. GEN:  Obese middle age female,  in no acute distress  HEENT: normal  Neck: no JVD, carotid bruits, or masses Cardiac: Irreg. Irreg. ; no murmurs, rubs, or gallops,no edema  Respiratory:  clear to auscultation bilaterally, normal work of breathing GI: soft, nontender, nondistended, + BS MS: no deformity or atrophy  Skin: warm and dry, no rash Neuro:  Strength and sensation are intact Psych: normal  EKG:  EKG is ordered today.  NSR at 61    Recent Labs: 05/11/2017: ALT 25; BUN 9; Creatinine, Ser 0.65; Hemoglobin 14.5; Platelets 256; Potassium 4.8; Sodium 136    Lipid Panel    Component Value Date/Time   CHOL 156 11/19/2016 1103   TRIG 296 (H) 11/19/2016 1103   HDL 30 (L) 11/19/2016 1103   CHOLHDL 5.2 (H) 11/19/2016 1103   VLDL 59 (H) 11/19/2016 1103   LDLCALC 67 11/19/2016 1103   LDLDIRECT 114.0 02/13/2015 0750      Wt Readings from Last 3 Encounters:  05/18/17 251 lb (113.9 kg)  05/11/17 256 lb 3.2 oz (116.2 kg)  03/31/17 254 lb (115.2 kg)      Other studies Reviewed: Additional studies/ records that  were  reviewed today include: . Review of the above records demonstrates:    ASSESSMENT AND PLAN:  1. Atrial fibrillation- She's back on flecainide 100 mg twice a day.  She has PAF -    will refer to the the A-fib clinic.  The flecainide has not been maintaining NSR  And is likely not the best choice for her given her chronic systolic CHF.  Continue for now.   She had no QRS widening with her treadmill.   2. Hypothyroidism - followed by her medical doctor   3. Type 2 diabetes mellitus - has been started on Jardiance   4. Chronic systolic CHF:  Her ejection fraction has markedly improved now that she is on her medications and her heart rate is slower. Her ejection fraction is 45% which is her baseline. Continue current medications.    5.   Hyperlipidemia: Continue mevacor for now. Encouraged diet and exercise.   Current medicines are reviewed at length with the patient today.  The patient does not have concerns regarding medicines.  The following changes have been made:  no change  Labs/ tests ordered today include:  No orders of the defined types were placed in this encounter.    Disposition:   FU with me in 6 months     Mertie Moores, MD  05/18/2017 12:08 PM    [address], [place], [place]  [address] Phone: [phone]; Fax: [phone]

## 2017-05-18 NOTE — Patient Instructions (Signed)
Medication Instructions:  Your physician recommends that you continue on your current medications as directed. Please refer to the Current Medication list given to you today.   Labwork: None Ordered   Testing/Procedures: None Ordered   Follow-Up: You have been referred to A Fib Clinic  Your physician wants you to follow-up in: 6 months with Dr. Nahser.  You will receive a reminder letter in the mail two months in advance. If you don't receive a letter, please call our office to schedule the follow-up appointment.   If you need a refill on your cardiac medications before your next appointment, please call your pharmacy.   Thank you for choosing [facility]! Raeann Offner, RN [phone]    

## 2017-05-21 MED FILL — metFORMIN HCL 1000 MG TABS: 1000 | 30 days supply | Qty: 60 | Fill #1

## 2017-05-25 ENCOUNTER — Other Ambulatory Visit: Payer: Self-pay | Admitting: Internal Medicine

## 2017-05-25 ENCOUNTER — Ambulatory Visit (HOSPITAL_COMMUNITY)
Admission: RE | Admit: 2017-05-25 | Discharge: 2017-05-25 | Disposition: A | Discharge status: Home / Self Care | Payer: BLUE CROSS/BLUE SHIELD | Source: Ambulatory Visit | Attending: Nurse Practitioner | Admitting: Nurse Practitioner

## 2017-05-25 ENCOUNTER — Encounter (HOSPITAL_COMMUNITY): Payer: Self-pay | Admitting: Nurse Practitioner

## 2017-05-25 VITALS — BP 118/84 | HR 76 | Ht 65.5 in | Wt 255.8 lb

## 2017-05-25 DIAGNOSIS — Z6841 Body Mass Index (BMI) 40.0 and over, adult: Secondary | ICD-10-CM | POA: Insufficient documentation

## 2017-05-25 DIAGNOSIS — Z7901 Long term (current) use of anticoagulants: Secondary | ICD-10-CM | POA: Insufficient documentation

## 2017-05-25 DIAGNOSIS — M1990 Unspecified osteoarthritis, unspecified site: Secondary | ICD-10-CM | POA: Diagnosis not present

## 2017-05-25 DIAGNOSIS — Z8249 Family history of ischemic heart disease and other diseases of the circulatory system: Secondary | ICD-10-CM | POA: Insufficient documentation

## 2017-05-25 DIAGNOSIS — E119 Type 2 diabetes mellitus without complications: Secondary | ICD-10-CM | POA: Diagnosis not present

## 2017-05-25 DIAGNOSIS — Z79899 Other long term (current) drug therapy: Secondary | ICD-10-CM | POA: Diagnosis not present

## 2017-05-25 DIAGNOSIS — E785 Hyperlipidemia, unspecified: Secondary | ICD-10-CM | POA: Insufficient documentation

## 2017-05-25 DIAGNOSIS — Z794 Long term (current) use of insulin: Secondary | ICD-10-CM | POA: Diagnosis not present

## 2017-05-25 DIAGNOSIS — Z885 Allergy status to narcotic agent status: Secondary | ICD-10-CM | POA: Diagnosis not present

## 2017-05-25 DIAGNOSIS — I10 Essential (primary) hypertension: Secondary | ICD-10-CM | POA: Insufficient documentation

## 2017-05-25 DIAGNOSIS — I481 Persistent atrial fibrillation: Secondary | ICD-10-CM | POA: Diagnosis not present

## 2017-05-25 DIAGNOSIS — Z8673 Personal history of transient ischemic attack (TIA), and cerebral infarction without residual deficits: Secondary | ICD-10-CM | POA: Insufficient documentation

## 2017-05-25 DIAGNOSIS — E669 Obesity, unspecified: Secondary | ICD-10-CM | POA: Insufficient documentation

## 2017-05-25 DIAGNOSIS — I4891 Unspecified atrial fibrillation: Secondary | ICD-10-CM | POA: Diagnosis not present

## 2017-05-25 DIAGNOSIS — Z87891 Personal history of nicotine dependence: Secondary | ICD-10-CM | POA: Diagnosis not present

## 2017-05-25 DIAGNOSIS — I4819 Other persistent atrial fibrillation: Secondary | ICD-10-CM

## 2017-05-25 MED ORDER — INSULIN GLARGINE 100 UNIT/ML SOLOSTAR PEN: 50.0000 [IU] | PEN_INJECTOR | Freq: Every day | SUBCUTANEOUS | 6 refills | Status: DC

## 2017-05-25 MED FILL — METOPROLOL TARTRATE 25 MG T: 25 | 30 days supply | Qty: 90 | Fill #3

## 2017-05-25 MED FILL — LOVASTATIN 20 MG TABLET: 20 | 30 days supply | Qty: 30 | Fill #3

## 2017-05-25 NOTE — Progress Notes (Addendum)
Primary Care Physician: Ladell Pier, MD Referring Physician: Dr. Holley Dexter Kelly Lang is a 56 y.o. female with a h/o afib since 2014 in the afib clinic for evaluation. She was admitted with HF when first diagnosed with with EF at 40-45% thought to be [place]. She had unsuccessful cardioversion . She was then placed on flecainide 01/2014 and did well until fall 2016 when she stopped meds because she was doing well. She had returned to afib and had a CVA. EF has dropped to 25%. She got back on flecainide and in rhythm, EF improved to 45%. She has mostly stayed in rhythm until the last few months. She is in the afib clinic to discuss options to restore SR.Marland Kitchen  She does not smoke, drink alcohol. No snoring /apnea per pt. She has struggled with her weight and her DM management but with recent med changes for DM she is hopeful this will help improve both. She feels that she has gained 20 lbs in the last year.   Today, she denies symptoms of palpitations, chest pain, shortness of breath, orthopnea, PND, lower extremity edema, dizziness, presyncope, syncope, or neurologic sequela. The patient is tolerating medications without difficulties and is otherwise without complaint today.   Past Medical History:  Diagnosis Date  . Allergy   . Arthritis   . Atrial fibrillation with RVR ([place])    a diagnosed 10/25/2013  . Chicken pox   . Diabetes mellitus ([place])    a. hg A1c 10, newly diagnosed (10/26/13)  . Dyslipidemia   . H/O: hypothyroidism    a. thyroid biopsy 1996 with synthroid. Stopped taking rx and was loss to follow up b. normal thyroid function 10/20/13  . Hx of cardiovascular stress test    ETT/Lexiscan Myoview (12/2013):  No ischemia; not gated; low risk.  Marland Kitchen Hypertension   . Obesity   . Stroke ([place])   . 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 2/2 Afib with RVR for unknown duration (EF: 40-45%)  . Thyroid disease    Past Surgical History:  Procedure Laterality Date  . BIOPSY  THYROID     1996  . CARDIOVERSION N/A 12/07/2013   Procedure: CARDIOVERSION;  Surgeon: Casandra Doffing, MD;  Location: [facility] ENDOSCOPY;  Service: Cardiovascular;  Laterality: N/A;  . CARDIOVERSION N/A 03/10/2016   Procedure: CARDIOVERSION;  Surgeon: Thayer Headings, MD;  Location: [facility];  Service: Cardiovascular;  Laterality: N/A;  . EYE MUSCLE SURGERY Right ~ 1966  . TONSILLECTOMY  1971    Current Outpatient Prescriptions  Medication Sig Dispense Refill  . Blood Glucose Monitoring Suppl (ONE TOUCH ULTRA 2) w/Device KIT 1 application by Does not apply route 2 (two) times daily. 1 each 0  . empagliflozin (JARDIANCE) 10 MG TABS tablet Take 10 mg by mouth daily. 30 tablet 5  . flecainide (TAMBOCOR) 100 MG tablet Take 1 tablet (100 mg total) by mouth 2 (two) times daily. 60 tablet 11  . glipiZIDE (GLUCOTROL) 5 MG tablet Take 1 tablet (5 mg total) by mouth 2 (two) times daily before a meal. 60 tablet 11  . glucose blood (ONE TOUCH TEST STRIPS) test strip Use as instructed 100 each 12  . Insulin Pen Needle (ULTICARE MICRO PEN NEEDLES) 32G X 4 MM MISC 1 applicator by Does not apply route at bedtime. 100 each 2  . Lancets (ONETOUCH ULTRASOFT) lancets Use as instructed 100 each 12  . lisinopril (PRINIVIL,ZESTRIL) 10 MG tablet Take 0.5 tablets (5 mg total) by mouth  daily. 90 tablet 3  . lovastatin (MEVACOR) 20 MG tablet Take 1 tablet (20 mg total) by mouth at bedtime. 90 tablet 3  . metFORMIN (GLUCOPHAGE) 1000 MG tablet Take 1 tablet (1,000 mg total) by mouth 2 (two) times daily with a meal. 60 tablet 2  . metoprolol tartrate (LOPRESSOR) 25 MG tablet TAKE 1 & 1/2 TABLET BY MOUTH 2 TIMES DAILY 90 tablet 6  . rivaroxaban (XARELTO) 20 MG TABS tablet Take 1 tablet (20 mg total) by mouth daily with supper. 30 tablet 11  . diclofenac sodium (VOLTAREN) 1 % GEL Apply 2 g topically 4 (four) times daily. (Patient not taking: Reported on 05/25/2017) 100 g 0  . Insulin Glargine (LANTUS SOLOSTAR) 100 UNIT/ML Solostar  Pen Inject 50 Units into the skin daily at 10 pm. 3 mL 6   Current Facility-Administered Medications  Medication Dose Route Frequency Provider Last Rate Last Dose  . 0.9 %  sodium chloride infusion  500 mL Intravenous Continuous Ladene Artist, MD        Allergies  Allergen Reactions  . Codeine Itching and Rash    Social History   Social History  . Marital status: Divorced    Spouse name: N/A  . Number of children: 0  . Years of education: 14   Occupational History  . banquet coordinator    Social History Main Topics  . Smoking status: Former Smoker    Packs/day: 1.00    Years: 22.00    Types: Cigarettes    Quit date: 12/01/2010  . Smokeless tobacco: Never Used  . Alcohol use Yes     Comment: 10/25/2013 "glass of wine maybe q other month"  . Drug use: No  . Sexual activity: Not Currently   Other Topics Concern  . Not on file   Social History Narrative   Moved to [place] from [place] in 2002. She has only seen one medical doctor in that time period when she had insurance. 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   Fun: shop, sew, decorate   Denies any beliefs effecting healthcare    Family History  Problem Relation Age of Onset  . Adopted: Yes  . Hypertension Mother   . Hyperlipidemia Mother     ROS- All systems are reviewed and negative except as per the HPI above  Physical Exam: Vitals:   05/25/17 0909  BP: 118/84  Pulse: 76  Weight: 255 lb 12.8 oz (116 kg)  Height: 5' 5.5" (1.664 m)   Wt Readings from Last 3 Encounters:  05/25/17 255 lb 12.8 oz (116 kg)  05/18/17 251 lb (113.9 kg)  05/11/17 256 lb 3.2 oz (116.2 kg)    Labs: Lab Results  Component Value Date   NA 136 05/11/2017   K 4.8 05/11/2017   CL 100 05/11/2017   CO2 23 05/11/2017   GLUCOSE 170 (H) 05/11/2017   BUN 9 05/11/2017   CREATININE 0.65 05/11/2017   CALCIUM 9.1 05/11/2017   MG 1.8 10/25/2013   Lab Results  Component Value Date   INR 1.53 (H)  09/16/2015   Lab Results  Component Value Date   CHOL 156 11/19/2016   HDL 30 (L) 11/19/2016   LDLCALC 67 11/19/2016   TRIG 296 (H) 11/19/2016     GEN- The patient is well appearing, alert and oriented x 3 today.   Head- normocephalic, atraumatic Eyes-  Sclera clear, conjunctiva pink Ears- hearing intact Oropharynx- clear Neck- supple, no JVP Lymph- no  cervical lymphadenopathy Lungs- Clear to ausculation bilaterally, normal work of breathing Heart- irregular rate and rhythm, no murmurs, rubs or gallops, PMI not laterally displaced GI- soft, NT, ND, + BS Extremities- no clubbing, cyanosis, or edema MS- no significant deformity or atrophy Skin- no rash or lesion Psych- euthymic mood, full affect Neuro- strength and sensation are intact  EKG- afib at 76 bpm, LAD, qrs int 108 ms, qtc 468 ms Epic records reviewed Echo-Study Conclusions  - Left ventricle: The cavity size was normal. Wall thickness was   normal. Indeterminant diastolic function (atrial fibrillation).   Systolic function was mildly reduced. The estimated ejection   fraction was in the range of 45% to 50%. Diffuse hypokinesis. - Aortic valve: There was no stenosis. - Mitral valve: Mildly calcified annulus. There was no significant   regurgitation. - Left atrium: The atrium was moderately dilated. 38 mm - Right ventricle: The cavity size was normal. Systolic function   was normal. - Pulmonary arteries: No complete TR doppler jet so unable to   estimate PA systolic pressure. - Inferior vena cava: The vessel was normal in size. The   respirophasic diameter changes were in the normal range (>= 50%),   consistent with normal central venous pressure.  Impressions:  - The patient was in atrial fibrillation. Normal LV size with EF   45-50%, diffuse hypokinesis. Normal RV size and systolic   function. No significant valvular abnormalities.    Study Conclusions  - Left ventricle: The cavity size was normal.  Wall thickness was   normal. Indeterminant diastolic function (atrial fibrillation).   Systolic function was mildly reduced. The estimated ejection   fraction was in the range of 45% to 50%. Diffuse hypokinesis. - Aortic valve: There was no stenosis. - Mitral valve: Mildly calcified annulus. There was no significant   regurgitation. - Left atrium: The atrium was moderately dilated. - Right ventricle: The cavity size was normal. Systolic function   was normal. - Pulmonary arteries: No complete TR doppler jet so unable to   estimate PA systolic pressure. - Inferior vena cava: The vessel was normal in size. The   respirophasic diameter changes were in the normal range (>= 50%),   consistent with normal central venous pressure.  Impressions:  - The patient was in atrial fibrillation. Normal LV size with EF   45-50%, diffuse hypokinesis. Normal RV size and systolic   function. No significant valvular abnormalities.  Assessment and Plan: 1. Persisitent afib Pt is now failing flecainide to maintain SR  I recommend stopping flecainide as it is no longer keeping pt in SR and is not the best option with LV dysfunction Continue metoprolol at current dose as she is rate controlled Options to restore SR discussed including ablation vrs Tikosyn or sotalol Pt would have to check on price if Tikosyn is used, she is already on a lot of meds which is already financially a burden and she does not care for additional cost burden Continue xarelto 20 mg a day for a chadsvasc score of at least 5 She would prefer an ablation Weight loss and regular exercise encouraged Info re weight class given Will schedule appointment with Dr. Rayann Heman to further discuss  Kelly Lang, [facility] [address] [place], [place] [address] [phone]

## 2017-05-26 MED FILL — XARELTO 20 MG TABLET: 20 | 30 days supply | Qty: 30 | Fill #0 | Status: TO

## 2017-05-26 MED FILL — XARELTO 20 MG TABLET: 20 | 30 days supply | Qty: 30 | Fill #0

## 2017-05-29 MED FILL — LANTUS SOLOSTAR 100 UNITS/M: 100 | 30 days supply | Qty: 15 | Fill #0

## 2017-06-08 ENCOUNTER — Ambulatory Visit (INDEPENDENT_AMBULATORY_CARE_PROVIDER_SITE_OTHER): Payer: BLUE CROSS/BLUE SHIELD | Admitting: Internal Medicine

## 2017-06-08 ENCOUNTER — Encounter: Payer: Self-pay | Admitting: Internal Medicine

## 2017-06-08 VITALS — BP 120/68 | HR 76 | Ht 65.5 in | Wt 253.0 lb

## 2017-06-08 DIAGNOSIS — I428 Other cardiomyopathies: Secondary | ICD-10-CM | POA: Diagnosis not present

## 2017-06-08 DIAGNOSIS — I5022 Chronic systolic (congestive) heart failure: Secondary | ICD-10-CM

## 2017-06-08 DIAGNOSIS — I481 Persistent atrial fibrillation: Secondary | ICD-10-CM | POA: Diagnosis not present

## 2017-06-08 DIAGNOSIS — I4819 Other persistent atrial fibrillation: Secondary | ICD-10-CM

## 2017-06-08 LAB — BASIC METABOLIC PANEL
BUN/Creatinine Ratio: 22 (ref 9–23)
BUN: 14 mg/dL (ref 6–24)
CALCIUM: 9.3 mg/dL (ref 8.7–10.2)
CHLORIDE: 103 mmol/L (ref 96–106)
CO2: 18 mmol/L — AB (ref 20–29)
Creatinine, Ser: 0.64 mg/dL (ref 0.57–1.00)
GFR calc Af Amer: 117 mL/min/{1.73_m2} (ref >59)
GFR calc non Af Amer: 101 mL/min/{1.73_m2} (ref >59)
GLUCOSE: 188 mg/dL — AB (ref 65–99)
Potassium: 4.8 mmol/L (ref 3.5–5.2)
Sodium: 137 mmol/L (ref 134–144)

## 2017-06-08 LAB — CBC WITH DIFFERENTIAL/PLATELET
BASOS ABS: 0.1 10*3/uL (ref 0.0–0.2)
Basos: 1 %
EOS (ABSOLUTE): 0.2 10*3/uL (ref 0.0–0.4)
Eos: 4 %
HEMOGLOBIN: 14.9 g/dL (ref 11.1–15.9)
Hematocrit: 43.4 % (ref 34.0–46.6)
Immature Grans (Abs): 0 10*3/uL (ref 0.0–0.1)
Immature Granulocytes: 1 %
LYMPHS: 26 %
Lymphocytes Absolute: 1.6 10*3/uL (ref 0.7–3.1)
MCH: 33 pg (ref 26.6–33.0)
MCHC: 34.3 g/dL (ref 31.5–35.7)
MCV: 96 fL (ref 79–97)
MONOCYTES: 8 %
Monocytes Absolute: 0.5 10*3/uL (ref 0.1–0.9)
NEUTROS PCT: 60 %
Neutrophils Absolute: 3.7 10*3/uL (ref 1.4–7.0)
PLATELETS: 247 10*3/uL (ref 150–379)
RBC: 4.51 x10E6/uL (ref 3.77–5.28)
RDW: 13.3 % (ref 12.3–15.4)
WBC: 6.2 10*3/uL (ref 3.4–10.8)

## 2017-06-08 NOTE — Progress Notes (Signed)
 Electrophysiology Office Note   Date:  06/08/2017   ID:  Kelly Lang, DOB 06/18/1961, MRN [id number]  PCP:  Johnson, Deborah B, MD  Cardiologist:  DR Nahser Primary Electrophysiologist: Nolan Tuazon, MD    Chief Complaint  Patient presents with  . Atrial Fibrillation     History of Present Illness: Kelly Lang is a 56 y.o. female who presents today for electrophysiology evaluation.   She is referred by Dr Nahser and Donna Carroll NP from the AF clinic for further evaluation and management of afib.  She reports that she was initially diagnosed with afib in 2014.  She has had increasing frequency and duration of afib since that time.  She reports symptoms of fatigue and decreased exercise tolerance with her afib.  She has recently failed medical therapy with flecainide.  Complications of her afib have included prior stroke and presumed tachycardia mediated cardiomyopathy.  Today, she denies symptoms of palpitations, chest pain, orthopnea, PND, lower extremity edema, claudication, dizziness, presyncope, syncope, bleeding, or neurologic sequela. The patient is tolerating medications without difficulties and is otherwise without complaint today.    Past Medical History:  Diagnosis Date  . Allergy   . Arthritis   . Chicken pox   . Diabetes mellitus (H[facility])    a. hg A1c 10, newly diagnosed (10/26/13)  . Dyslipidemia   . H/O: hypothyroidism    a. thyroid biopsy 1996 with synthroid. Stopped taking rx and was loss to follow up b. normal thyroid function 10/20/13  . Hx of cardiovascular stress test    ETT/Lexiscan Myoview (12/2013):  No ischemia; not gated; low risk.  . Hypertension   . Obesity   . Persistent atrial fibrillation ([facility])    a diagnosed 10/25/2013  . Stroke ([facility])   . 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 2/2 Afib with RVR for unknown duration (EF: 40-45%)  . Thyroid disease    Past Surgical History:  Procedure Laterality Date  . BIOPSY THYROID     1996  .  CARDIOVERSION N/A 12/07/2013   Procedure: CARDIOVERSION;  Surgeon: Jay Varanasi, MD;  Location: [facility] ENDOSCOPY;  Service: Cardiovascular;  Laterality: N/A;  . CARDIOVERSION N/A 03/10/2016   Procedure: CARDIOVERSION;  Surgeon: Philip J Nahser, MD;  Location: [facility] ENDOSCOPY;  Service: Cardiovascular;  Laterality: N/A;  . EYE MUSCLE SURGERY Right ~ 1966  . TONSILLECTOMY  1971     Current Outpatient Prescriptions  Medication Sig Dispense Refill  . Blood Glucose Monitoring Suppl (ONE TOUCH ULTRA 2) w/Device KIT 1 application by Does not apply route 2 (two) times daily. 1 each 0  . diclofenac sodium (VOLTAREN) 1 % GEL Apply 2 g topically 4 (four) times daily. 100 g 0  . empagliflozin (JARDIANCE) 10 MG TABS tablet Take 10 mg by mouth daily. 30 tablet 5  . glipiZIDE (GLUCOTROL) 5 MG tablet Take 1 tablet (5 mg total) by mouth 2 (two) times daily before a meal. 60 tablet 11  . glucose blood (ONE TOUCH TEST STRIPS) test strip Use as instructed 100 each 12  . Insulin Glargine (LANTUS SOLOSTAR) 100 UNIT/ML Solostar Pen Inject 50 Units into the skin daily at 10 pm. 3 mL 6  . Insulin Pen Needle (ULTICARE MICRO PEN NEEDLES) 32G X 4 MM MISC 1 applicator by Does not apply route at bedtime. 100 each 2  . Lancets (ONETOUCH ULTRASOFT) lancets Use as instructed 100 each 12  . lisinopril (PRINIVIL,ZESTRIL) 10 MG tablet Take 0.5 tablets (5 mg total) by mouth daily. 90   tablet 3  . lovastatin (MEVACOR) 20 MG tablet Take 1 tablet (20 mg total) by mouth at bedtime. 90 tablet 3  . metFORMIN (GLUCOPHAGE) 1000 MG tablet Take 1 tablet (1,000 mg total) by mouth 2 (two) times daily with a meal. 60 tablet 2  . metoprolol tartrate (LOPRESSOR) 25 MG tablet TAKE 1 & 1/2 TABLET BY MOUTH 2 TIMES DAILY 90 tablet 6  . rivaroxaban (XARELTO) 20 MG TABS tablet Take 1 tablet (20 mg total) by mouth daily with supper. 30 tablet 11   Current Facility-Administered Medications  Medication Dose Route Frequency Provider Last Rate Last Dose  . 0.9 %   sodium chloride infusion  500 mL Intravenous Continuous Ladene Artist, MD        Allergies:   Codeine   Social History:  The patient  reports that she quit smoking about 6 years ago. Her smoking use included Cigarettes. She has a 22.00 pack-year smoking history. She has never used smokeless tobacco. She reports that she drinks alcohol. She reports that she does not use drugs.   Family History:  The patient was adopted and is unaware of her family history   ROS:  Please see the history of present illness.   All other systems are personally reviewed and negative.    PHYSICAL EXAM: VS:  BP 120/68   Pulse 76   Ht 5' 5.5" (1.664 m)   Wt 253 lb (114.8 kg)   SpO2 98%   BMI 41.46 kg/m  , BMI Body mass index is 41.46 kg/m. GEN: overweight, in no acute distress  HEENT: normal  Neck: no JVD, carotid bruits, or masses Cardiac: iRRR; no murmurs, rubs, or gallops,no edema  Respiratory:  clear to auscultation bilaterally, normal work of breathing GI: soft, nontender, nondistended, + BS MS: no deformity or atrophy  Skin: warm and dry  Neuro:  Strength and sensation are intact Psych: euthymic mood, full affect  EKG:  EKG is ordered today. The ekg ordered today is personally reviewed and shows afib, V rate 76 bpm, Qtc 436 msec   Recent Labs: 05/11/2017: ALT 25; BUN 9; Creatinine, Ser 0.65; Hemoglobin 14.5; Platelets 256; Potassium 4.8; Sodium 136  personally reviewed   Lipid Panel     Component Value Date/Time   CHOL 156 11/19/2016 1103   TRIG 296 (H) 11/19/2016 1103   HDL 30 (L) 11/19/2016 1103   CHOLHDL 5.2 (H) 11/19/2016 1103   VLDL 59 (H) 11/19/2016 1103   LDLCALC 67 11/19/2016 1103   LDLDIRECT 114.0 02/13/2015 0750   personally reviewed   Wt Readings from Last 3 Encounters:  06/08/17 253 lb (114.8 kg)  05/25/17 255 lb 12.8 oz (116 kg)  05/18/17 251 lb (113.9 kg)      Other studies personally reviewed: Additional studies/ records that were reviewed today include: AF  clinic notes, echo from 2017, Dr Lanny Hurst records  Review of the above records today demonstrates: as above   ASSESSMENT AND PLAN:  1.  Persistent afib The patient has symptomatic persistent afib.  She has failed medical therapy with flecainide.  Therapeutic strategies for afib including medicine (tikosyn) and ablation were discussed in detail with the patient today. Risk, benefits, and alternatives to EP study and radiofrequency ablation for afib were also discussed in detail today. These risks include but are not limited to stroke, bleeding, vascular damage, tamponade, perforation, damage to the esophagus, lungs, and other structures, pulmonary vein stenosis, worsening renal function, and death. The patient understands these risk and wishes  to proceed.  We will therefore proceed with catheter ablation oat the next available time.  Will plan TEE prior to ablation to exclude thrombus.  2 D echo at this time to evaluate EF and LA size. chads2vasc score is at least 6.  The importance of anticoagulation compliance was discussed with the patient today  2. HTN Stable No change required today  3. Prior stroke chads2vasc score is at least 6.  The importance of anticoagulation compliance was discussed with the patient today TEE prior to ablation  4. Obesity Body mass index is 41.46 kg/m. Lifestyle modification would be beneficial to maintaining sinus rhythm long term  5. Nonischemic CM Presumed to be due to afib Update echo [facility] would suggest that she may receive additional benefit with ablation  Current medicines are reviewed at length with the patient today.   The patient does not have concerns regarding her medicines.  The following changes were made today:  none    Signed, James Allred, MD  06/08/2017 9:08 AM     [facility] [address] [place] [address] [phone] (office) [phone] (fax) 

## 2017-06-08 NOTE — Patient Instructions (Addendum)
Medication Instructions:  Your physician recommends that you continue on your current medications as directed. Please refer to the Current Medication list given to you today.   Labwork: Your physician recommends that you have  lab work today: CBC, BMP   Testing/Procedures: Your physician has requested that you have an echocardiogram. Echocardiography is a painless test that uses sound waves to create images of your heart. It provides your doctor with information about the size and shape of your heart and how well your heart's chambers and valves are working. This procedure takes approximately one hour. There are no restrictions for this procedure.   Your physician has requested that you have a TEE. During a TEE, sound waves are used to create images of your heart. It provides your doctor with information about the size and shape of your heart and how well your heart's chambers and valves are working. In this test, a transducer is attached to the end of a flexible tube that's guided down your throat and into your esophagus (the tube leading from you mouth to your stomach) to get a more detailed image of your heart. You are not awake for the procedure. Please see the instruction sheet given to you today. For further information please visit [email or web address]  Please arrive at [facility] Entrance of [facility] at 7:30am Do not eat or drink after midnight the night prior to the procedure Okay take any medications the morning of the test---HOLD all diabetes medications and ONLY take 1/2 of insulin dose Will need someone to drive you home at discharge      Your physician has recommended that you have an ablation. Catheter ablation is a medical procedure used to treat some cardiac arrhythmias (irregular heartbeats). During catheter ablation, a long, thin, flexible tube is put into a blood vessel in your groin (upper thigh), or neck. This tube is called an ablation  catheter. It is then guided to your heart through the blood vessel. Radio frequency waves destroy small areas of heart tissue where abnormal heartbeats may cause an arrhythmia to start. Please see the instruction sheet given to you today.---06/30/17  Please arrive at [facility] Entrance of [facility] at 5:30am Do not eat or drink after midnight the night prior to the procedure Do not take any medications the morning of the test Plan for one night stay Will need someone to drive you home at discharge      Follow-Up: Your physician recommends that you schedule a follow-up appointment in: 4 weeks from 06/30/17 with Afib clinic and 3 months from 06/30/17 with Dr. Johney Frame.       If you need a refill on your cardiac medications before your next appointment, please call your pharmacy.

## 2017-06-10 MED FILL — JARDIANCE 10 MG TABLET: 10 | 30 days supply | Qty: 30 | Fill #1

## 2017-06-10 MED FILL — glipiZIDE 5 MG TABS: 5 | 30 days supply | Qty: 60 | Fill #0

## 2017-06-17 MED FILL — metFORMIN HCL 1000 MG TABS: 1000 | 30 days supply | Qty: 60 | Fill #0

## 2017-06-17 MED FILL — LISINOPRIL 10 MG TABLET: 10 | 90 days supply | Qty: 45 | Fill #2

## 2017-06-22 ENCOUNTER — Ambulatory Visit (HOSPITAL_COMMUNITY): Payer: BLUE CROSS/BLUE SHIELD | Attending: Internal Medicine

## 2017-06-22 ENCOUNTER — Other Ambulatory Visit: Payer: Self-pay

## 2017-06-22 DIAGNOSIS — I420 Dilated cardiomyopathy: Secondary | ICD-10-CM | POA: Insufficient documentation

## 2017-06-22 DIAGNOSIS — I4819 Other persistent atrial fibrillation: Secondary | ICD-10-CM

## 2017-06-22 DIAGNOSIS — I481 Persistent atrial fibrillation: Secondary | ICD-10-CM

## 2017-06-22 DIAGNOSIS — I5022 Chronic systolic (congestive) heart failure: Secondary | ICD-10-CM

## 2017-06-22 DIAGNOSIS — I480 Paroxysmal atrial fibrillation: Secondary | ICD-10-CM | POA: Diagnosis not present

## 2017-06-22 DIAGNOSIS — I428 Other cardiomyopathies: Secondary | ICD-10-CM

## 2017-06-23 ENCOUNTER — Other Ambulatory Visit: Payer: Self-pay | Admitting: Internal Medicine

## 2017-06-23 MED FILL — XARELTO 20 MG TABLET: 20 | 30 days supply | Qty: 30 | Fill #1

## 2017-06-23 MED FILL — LOVASTATIN 20 MG TABLET: 20 | 30 days supply | Qty: 30 | Fill #4

## 2017-06-23 MED FILL — LANTUS SOLOSTAR 100 UNITS/M: 100 | 12 days supply | Qty: 6 | Fill #1

## 2017-06-23 MED FILL — METOPROLOL TARTRATE 25 MG T: 25 | 30 days supply | Qty: 90 | Fill #4

## 2017-06-29 ENCOUNTER — Ambulatory Visit (HOSPITAL_COMMUNITY)
Admission: RE | Admit: 2017-06-29 | Discharge: 2017-06-29 | Disposition: A | Discharge status: Home / Self Care | Payer: BLUE CROSS/BLUE SHIELD | Source: Ambulatory Visit | Attending: Internal Medicine | Admitting: Internal Medicine

## 2017-06-29 ENCOUNTER — Ambulatory Visit (HOSPITAL_BASED_OUTPATIENT_CLINIC_OR_DEPARTMENT_OTHER): Payer: BLUE CROSS/BLUE SHIELD

## 2017-06-29 ENCOUNTER — Encounter (HOSPITAL_COMMUNITY)
Admission: RE | Disposition: A | Discharge status: Home / Self Care | Payer: Self-pay | Source: Ambulatory Visit | Attending: Internal Medicine

## 2017-06-29 ENCOUNTER — Encounter (HOSPITAL_COMMUNITY): Payer: Self-pay | Admitting: Emergency Medicine

## 2017-06-29 DIAGNOSIS — Z87891 Personal history of nicotine dependence: Secondary | ICD-10-CM | POA: Insufficient documentation

## 2017-06-29 DIAGNOSIS — E119 Type 2 diabetes mellitus without complications: Secondary | ICD-10-CM | POA: Insufficient documentation

## 2017-06-29 DIAGNOSIS — I481 Persistent atrial fibrillation: Secondary | ICD-10-CM | POA: Insufficient documentation

## 2017-06-29 DIAGNOSIS — Z794 Long term (current) use of insulin: Secondary | ICD-10-CM | POA: Diagnosis not present

## 2017-06-29 DIAGNOSIS — E039 Hypothyroidism, unspecified: Secondary | ICD-10-CM | POA: Diagnosis not present

## 2017-06-29 DIAGNOSIS — Z6841 Body Mass Index (BMI) 40.0 and over, adult: Secondary | ICD-10-CM | POA: Diagnosis not present

## 2017-06-29 DIAGNOSIS — I10 Essential (primary) hypertension: Secondary | ICD-10-CM | POA: Diagnosis not present

## 2017-06-29 DIAGNOSIS — Z79899 Other long term (current) drug therapy: Secondary | ICD-10-CM | POA: Insufficient documentation

## 2017-06-29 DIAGNOSIS — E785 Hyperlipidemia, unspecified: Secondary | ICD-10-CM | POA: Insufficient documentation

## 2017-06-29 DIAGNOSIS — I4819 Other persistent atrial fibrillation: Secondary | ICD-10-CM

## 2017-06-29 DIAGNOSIS — Z8673 Personal history of transient ischemic attack (TIA), and cerebral infarction without residual deficits: Secondary | ICD-10-CM | POA: Insufficient documentation

## 2017-06-29 DIAGNOSIS — I4891 Unspecified atrial fibrillation: Secondary | ICD-10-CM

## 2017-06-29 DIAGNOSIS — Z7901 Long term (current) use of anticoagulants: Secondary | ICD-10-CM | POA: Diagnosis not present

## 2017-06-29 DIAGNOSIS — E669 Obesity, unspecified: Secondary | ICD-10-CM | POA: Diagnosis not present

## 2017-06-29 HISTORY — PX: TEE WITHOUT CARDIOVERSION: SHX5443

## 2017-06-29 SURGERY — ECHOCARDIOGRAM, TRANSESOPHAGEAL
Anesthesia: Moderate Sedation

## 2017-06-29 MED ORDER — MIDAZOLAM HCL 10 MG/2ML IJ SOLN
INTRAMUSCULAR | Status: DC | PRN
  Administered 2017-06-29 (×3): 1 mg via INTRAVENOUS

## 2017-06-29 MED ORDER — FENTANYL CITRATE (PF) 100 MCG/2ML IJ SOLN
INTRAMUSCULAR | Status: DC | PRN
  Administered 2017-06-29 (×2): 12.5 ug via INTRAVENOUS

## 2017-06-29 MED ORDER — SODIUM CHLORIDE 0.9 % IV SOLN: INTRAVENOUS | Status: DC

## 2017-06-29 MED ORDER — BUTAMBEN-TETRACAINE-BENZOCAINE 2-2-14 % EX AERO
INHALATION_SPRAY | CUTANEOUS | Status: DC | PRN
  Administered 2017-06-29: 2 via TOPICAL

## 2017-06-29 MED ORDER — MIDAZOLAM HCL 5 MG/ML IJ SOLN
INTRAMUSCULAR | Status: AC
  Filled 2017-06-29: qty 2

## 2017-06-29 MED ORDER — FENTANYL CITRATE (PF) 100 MCG/2ML IJ SOLN
INTRAMUSCULAR | Status: AC
  Filled 2017-06-29: qty 2

## 2017-06-29 NOTE — Progress Notes (Signed)
  Echocardiogram Echocardiogram Transesophageal has been performed.  Leta Jungling M 06/29/2017, 10:02 AM

## 2017-06-29 NOTE — Interval H&P Note (Signed)
History and Physical Interval Note:  06/29/2017 8:48 AM  Kelly Lang  has presented today for surgery, with the diagnosis of AFIB  The various methods of treatment have been discussed with the patient and family. After consideration of risks, benefits and other options for treatment, the patient has consented to  Procedure(s): TRANSESOPHAGEAL ECHOCARDIOGRAM (TEE) (N/A) as a surgical intervention .  The patient's history has been reviewed, patient examined, no change in status, stable for surgery.  I have reviewed the patient's chart and labs.  Questions were answered to the patient's satisfaction.     Dietrich Pates

## 2017-06-29 NOTE — CV Procedure (Signed)
LA, LAA without masses TV nromal  Triv TR MV normal  Mild MR AV normal  No AI PV normal  Trace PI LVEF is mod to severely depressed  RVEF depressed No PFO by color doppler or with injection of agitated saline   Aorta normal

## 2017-06-29 NOTE — H&P (View-Only) (Signed)
 Electrophysiology Office Note   Date:  06/08/2017   ID:  Kelly Lang, DOB 09/19/1961, MRN [id number]  PCP:  Johnson, Deborah B, MD  Cardiologist:  DR Nahser Primary Electrophysiologist: Braileigh Landenberger, MD    Chief Complaint  Patient presents with  . Atrial Fibrillation     History of Present Illness: Kelly Lang is a 56 y.o. female who presents today for electrophysiology evaluation.   She is referred by Dr Nahser and Donna Carroll NP from the AF clinic for further evaluation and management of afib.  She reports that she was initially diagnosed with afib in 2014.  She has had increasing frequency and duration of afib since that time.  She reports symptoms of fatigue and decreased exercise tolerance with her afib.  She has recently failed medical therapy with flecainide.  Complications of her afib have included prior stroke and presumed tachycardia mediated cardiomyopathy.  Today, she denies symptoms of palpitations, chest pain, orthopnea, PND, lower extremity edema, claudication, dizziness, presyncope, syncope, bleeding, or neurologic sequela. The patient is tolerating medications without difficulties and is otherwise without complaint today.    Past Medical History:  Diagnosis Date  . Allergy   . Arthritis   . Chicken pox   . Diabetes mellitus (HCC)    a. hg A1c 10, newly diagnosed (10/26/13)  . Dyslipidemia   . H/O: hypothyroidism    a. thyroid biopsy 1996 with synthroid. Stopped taking rx and was loss to follow up b. normal thyroid function 10/20/13  . Hx of cardiovascular stress test    ETT/Lexiscan Myoview (12/2013):  No ischemia; not gated; low risk.  . Hypertension   . Obesity   . Persistent atrial fibrillation (HCC)    a diagnosed 10/25/2013  . Stroke (HCC)   . 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 2/2 Afib with RVR for unknown duration (EF: 40-45%)  . Thyroid disease    Past Surgical History:  Procedure Laterality Date  . BIOPSY THYROID     1996  .  CARDIOVERSION N/A 12/07/2013   Procedure: CARDIOVERSION;  Surgeon: Jay Varanasi, MD;  Location: [facility];  Service: Cardiovascular;  Laterality: N/A;  . CARDIOVERSION N/A 03/10/2016   Procedure: CARDIOVERSION;  Surgeon: Philip J Nahser, MD;  Location: [facility];  Service: Cardiovascular;  Laterality: N/A;  . EYE MUSCLE SURGERY Right ~ 1966  . TONSILLECTOMY  1971     Current Outpatient Prescriptions  Medication Sig Dispense Refill  . Blood Glucose Monitoring Suppl (ONE TOUCH ULTRA 2) w/Device KIT 1 application by Does not apply route 2 (two) times daily. 1 each 0  . diclofenac sodium (VOLTAREN) 1 % GEL Apply 2 g topically 4 (four) times daily. 100 g 0  . empagliflozin (JARDIANCE) 10 MG TABS tablet Take 10 mg by mouth daily. 30 tablet 5  . glipiZIDE (GLUCOTROL) 5 MG tablet Take 1 tablet (5 mg total) by mouth 2 (two) times daily before a meal. 60 tablet 11  . glucose blood (ONE TOUCH TEST STRIPS) test strip Use as instructed 100 each 12  . Insulin Glargine (LANTUS SOLOSTAR) 100 UNIT/ML Solostar Pen Inject 50 Units into the skin daily at 10 pm. 3 mL 6  . Insulin Pen Needle (ULTICARE MICRO PEN NEEDLES) 32G X 4 MM MISC 1 applicator by Does not apply route at bedtime. 100 each 2  . Lancets (ONETOUCH ULTRASOFT) lancets Use as instructed 100 each 12  . lisinopril (PRINIVIL,ZESTRIL) 10 MG tablet Take 0.5 tablets (5 mg total) by mouth daily. 90   tablet 3  . lovastatin (MEVACOR) 20 MG tablet Take 1 tablet (20 mg total) by mouth at bedtime. 90 tablet 3  . metFORMIN (GLUCOPHAGE) 1000 MG tablet Take 1 tablet (1,000 mg total) by mouth 2 (two) times daily with a meal. 60 tablet 2  . metoprolol tartrate (LOPRESSOR) 25 MG tablet TAKE 1 & 1/2 TABLET BY MOUTH 2 TIMES DAILY 90 tablet 6  . rivaroxaban (XARELTO) 20 MG TABS tablet Take 1 tablet (20 mg total) by mouth daily with supper. 30 tablet 11   Current Facility-Administered Medications  Medication Dose Route Frequency Provider Last Rate Last Dose  . 0.9 %   sodium chloride infusion  500 mL Intravenous Continuous Ladene Artist, MD        Allergies:   Codeine   Social History:  The patient  reports that she quit smoking about 6 years ago. Her smoking use included Cigarettes. She has a 22.00 pack-year smoking history. She has never used smokeless tobacco. She reports that she drinks alcohol. She reports that she does not use drugs.   Family History:  The patient was adopted and is unaware of her family history   ROS:  Please see the history of present illness.   All other systems are personally reviewed and negative.    PHYSICAL EXAM: VS:  BP 120/68   Pulse 76   Ht 5' 5.5" (1.664 m)   Wt 253 lb (114.8 kg)   SpO2 98%   BMI 41.46 kg/m  , BMI Body mass index is 41.46 kg/m. GEN: overweight, in no acute distress  HEENT: normal  Neck: no JVD, carotid bruits, or masses Cardiac: iRRR; no murmurs, rubs, or gallops,no edema  Respiratory:  clear to auscultation bilaterally, normal work of breathing GI: soft, nontender, nondistended, + BS MS: no deformity or atrophy  Skin: warm and dry  Neuro:  Strength and sensation are intact Psych: euthymic mood, full affect  EKG:  EKG is ordered today. The ekg ordered today is personally reviewed and shows afib, V rate 76 bpm, Qtc 436 msec   Recent Labs: 05/11/2017: ALT 25; BUN 9; Creatinine, Ser 0.65; Hemoglobin 14.5; Platelets 256; Potassium 4.8; Sodium 136  personally reviewed   Lipid Panel     Component Value Date/Time   CHOL 156 11/19/2016 1103   TRIG 296 (H) 11/19/2016 1103   HDL 30 (L) 11/19/2016 1103   CHOLHDL 5.2 (H) 11/19/2016 1103   VLDL 59 (H) 11/19/2016 1103   LDLCALC 67 11/19/2016 1103   LDLDIRECT 114.0 02/13/2015 0750   personally reviewed   Wt Readings from Last 3 Encounters:  06/08/17 253 lb (114.8 kg)  05/25/17 255 lb 12.8 oz (116 kg)  05/18/17 251 lb (113.9 kg)      Other studies personally reviewed: Additional studies/ records that were reviewed today include: AF  clinic notes, echo from 2017, Dr Lanny Hurst records  Review of the above records today demonstrates: as above   ASSESSMENT AND PLAN:  1.  Persistent afib The patient has symptomatic persistent afib.  She has failed medical therapy with flecainide.  Therapeutic strategies for afib including medicine (tikosyn) and ablation were discussed in detail with the patient today. Risk, benefits, and alternatives to EP study and radiofrequency ablation for afib were also discussed in detail today. These risks include but are not limited to stroke, bleeding, vascular damage, tamponade, perforation, damage to the esophagus, lungs, and other structures, pulmonary vein stenosis, worsening renal function, and death. The patient understands these risk and wishes  to proceed.  We will therefore proceed with catheter ablation oat the next available time.  Will plan TEE prior to ablation to exclude thrombus.  2 D echo at this time to evaluate EF and LA size. chads2vasc score is at least 6.  The importance of anticoagulation compliance was discussed with the patient today  2. HTN Stable No change required today  3. Prior stroke chads2vasc score is at least 6.  The importance of anticoagulation compliance was discussed with the patient today TEE prior to ablation  4. Obesity Body mass index is 41.46 kg/m. Lifestyle modification would be beneficial to maintaining sinus rhythm long term  5. Nonischemic CM Presumed to be due to afib Update echo [place] would suggest that she may receive additional benefit with ablation  Current medicines are reviewed at length with the patient today.   The patient does not have concerns regarding her medicines.  The following changes were made today:  none    Signed, Ruel Dimmick, MD  06/08/2017 9:08 AM     [facility] [address] [place] [address] [phone] (office) [phone] (fax) 

## 2017-06-30 ENCOUNTER — Encounter (HOSPITAL_COMMUNITY): Payer: Self-pay | Admitting: General Practice

## 2017-06-30 ENCOUNTER — Ambulatory Visit (HOSPITAL_COMMUNITY)
Admission: RE | Admit: 2017-06-30 | Discharge: 2017-07-01 | Disposition: A | Discharge status: Home / Self Care | Payer: BLUE CROSS/BLUE SHIELD | Source: Ambulatory Visit | Attending: Internal Medicine | Admitting: Internal Medicine

## 2017-06-30 ENCOUNTER — Ambulatory Visit (HOSPITAL_COMMUNITY): Payer: BLUE CROSS/BLUE SHIELD | Admitting: Certified Registered Nurse Anesthetist

## 2017-06-30 ENCOUNTER — Encounter (HOSPITAL_COMMUNITY)
Admission: RE | Disposition: A | Discharge status: Home / Self Care | Payer: Self-pay | Source: Ambulatory Visit | Attending: Internal Medicine

## 2017-06-30 DIAGNOSIS — E119 Type 2 diabetes mellitus without complications: Secondary | ICD-10-CM | POA: Diagnosis not present

## 2017-06-30 DIAGNOSIS — R739 Hyperglycemia, unspecified: Secondary | ICD-10-CM | POA: Diagnosis not present

## 2017-06-30 DIAGNOSIS — E039 Hypothyroidism, unspecified: Secondary | ICD-10-CM | POA: Diagnosis not present

## 2017-06-30 DIAGNOSIS — M1990 Unspecified osteoarthritis, unspecified site: Secondary | ICD-10-CM | POA: Diagnosis not present

## 2017-06-30 DIAGNOSIS — Z8673 Personal history of transient ischemic attack (TIA), and cerebral infarction without residual deficits: Secondary | ICD-10-CM | POA: Insufficient documentation

## 2017-06-30 DIAGNOSIS — I4819 Other persistent atrial fibrillation: Secondary | ICD-10-CM | POA: Diagnosis present

## 2017-06-30 DIAGNOSIS — Z7901 Long term (current) use of anticoagulants: Secondary | ICD-10-CM | POA: Diagnosis not present

## 2017-06-30 DIAGNOSIS — Z6841 Body Mass Index (BMI) 40.0 and over, adult: Secondary | ICD-10-CM | POA: Diagnosis not present

## 2017-06-30 DIAGNOSIS — E785 Hyperlipidemia, unspecified: Secondary | ICD-10-CM | POA: Diagnosis not present

## 2017-06-30 DIAGNOSIS — I481 Persistent atrial fibrillation: Secondary | ICD-10-CM | POA: Insufficient documentation

## 2017-06-30 DIAGNOSIS — Z794 Long term (current) use of insulin: Secondary | ICD-10-CM | POA: Diagnosis not present

## 2017-06-30 DIAGNOSIS — I429 Cardiomyopathy, unspecified: Secondary | ICD-10-CM | POA: Diagnosis not present

## 2017-06-30 DIAGNOSIS — Z87891 Personal history of nicotine dependence: Secondary | ICD-10-CM | POA: Insufficient documentation

## 2017-06-30 DIAGNOSIS — I4891 Unspecified atrial fibrillation: Secondary | ICD-10-CM | POA: Diagnosis not present

## 2017-06-30 DIAGNOSIS — I10 Essential (primary) hypertension: Secondary | ICD-10-CM | POA: Diagnosis not present

## 2017-06-30 HISTORY — DX: Type 2 diabetes mellitus without complications: E11.9

## 2017-06-30 HISTORY — DX: Pure hypercholesterolemia, unspecified: E78.00

## 2017-06-30 HISTORY — DX: Other seasonal allergic rhinitis: J30.2

## 2017-06-30 HISTORY — PX: ATRIAL FIBRILLATION ABLATION: EP1191

## 2017-06-30 LAB — CBC
HCT: 44.1 % (ref 36.0–46.0)
HEMOGLOBIN: 14.4 g/dL (ref 12.0–15.0)
MCH: 31.7 pg (ref 26.0–34.0)
MCHC: 32.7 g/dL (ref 30.0–36.0)
MCV: 97.1 fL (ref 78.0–100.0)
PLATELETS: 212 10*3/uL (ref 150–400)
RBC: 4.54 MIL/uL (ref 3.87–5.11)
RDW: 13.4 % (ref 11.5–15.5)
WBC: 5.4 10*3/uL (ref 4.0–10.5)

## 2017-06-30 LAB — BASIC METABOLIC PANEL
ANION GAP: 8 (ref 5–15)
BUN: 14 mg/dL (ref 6–20)
CHLORIDE: 106 mmol/L (ref 101–111)
CO2: 20 mmol/L — AB (ref 22–32)
CREATININE: 0.72 mg/dL (ref 0.44–1.00)
Calcium: 8.5 mg/dL — ABNORMAL LOW (ref 8.9–10.3)
GFR calc non Af Amer: >60 mL/min (ref >60)
GLUCOSE: 133 mg/dL — AB (ref 65–99)
Potassium: 4.1 mmol/L (ref 3.5–5.1)
Sodium: 134 mmol/L — ABNORMAL LOW (ref 135–145)

## 2017-06-30 LAB — POCT ACTIVATED CLOTTING TIME
ACTIVATED CLOTTING TIME: 241 s
ACTIVATED CLOTTING TIME: 296 s
ACTIVATED CLOTTING TIME: 186 s
ACTIVATED CLOTTING TIME: 191 s
Activated Clotting Time: 274 seconds

## 2017-06-30 LAB — GLUCOSE, CAPILLARY
Glucose-Capillary: 179 mg/dL — ABNORMAL HIGH (ref 65–99)
Glucose-Capillary: 172 mg/dL — ABNORMAL HIGH (ref 65–99)
Glucose-Capillary: 223 mg/dL — ABNORMAL HIGH (ref 65–99)
Glucose-Capillary: 280 mg/dL — ABNORMAL HIGH (ref 65–99)

## 2017-06-30 SURGERY — ATRIAL FIBRILLATION ABLATION
Anesthesia: General

## 2017-06-30 MED ORDER — ACETAMINOPHEN 325 MG PO TABS: 650.0000 mg | ORAL_TABLET | ORAL | Status: DC | PRN

## 2017-06-30 MED ORDER — FENTANYL CITRATE (PF) 250 MCG/5ML IJ SOLN
INTRAMUSCULAR | Status: DC | PRN
  Administered 2017-06-30 (×2): 100 ug via INTRAVENOUS

## 2017-06-30 MED ORDER — RIVAROXABAN 20 MG PO TABS
20.0000 mg | ORAL_TABLET | Freq: Every day | ORAL | Status: DC
  Administered 2017-06-30: 20 mg via ORAL
  Filled 2017-06-30: qty 1

## 2017-06-30 MED ORDER — IOPAMIDOL (ISOVUE-370) INJECTION 76%
INTRAVENOUS | Status: AC
  Filled 2017-06-30: qty 50

## 2017-06-30 MED ORDER — PROTAMINE SULFATE 10 MG/ML IV SOLN
INTRAVENOUS | Status: DC | PRN
  Administered 2017-06-30 (×3): 10 mg via INTRAVENOUS

## 2017-06-30 MED ORDER — LISINOPRIL 5 MG PO TABS
5.0000 mg | ORAL_TABLET | Freq: Every day | ORAL | Status: DC
  Administered 2017-07-01: 5 mg via ORAL
  Filled 2017-06-30: qty 1

## 2017-06-30 MED ORDER — ALBUTEROL SULFATE HFA 108 (90 BASE) MCG/ACT IN AERS
INHALATION_SPRAY | RESPIRATORY_TRACT | Status: DC | PRN
  Administered 2017-06-30: 2 via RESPIRATORY_TRACT

## 2017-06-30 MED ORDER — INSULIN GLARGINE 100 UNIT/ML ~~LOC~~ SOLN
50.0000 [IU] | Freq: Every day | SUBCUTANEOUS | Status: DC
  Administered 2017-06-30: 50 [IU] via SUBCUTANEOUS
  Filled 2017-06-30: qty 0.5

## 2017-06-30 MED ORDER — DEXAMETHASONE SODIUM PHOSPHATE 10 MG/ML IJ SOLN
INTRAMUSCULAR | Status: DC | PRN
  Administered 2017-06-30: 8 mg via INTRAVENOUS

## 2017-06-30 MED ORDER — SUGAMMADEX SODIUM 200 MG/2ML IV SOLN
INTRAVENOUS | Status: DC | PRN
  Administered 2017-06-30: 200 mg via INTRAVENOUS

## 2017-06-30 MED ORDER — SODIUM CHLORIDE 0.9% FLUSH: 3.0000 mL | INTRAVENOUS | Status: DC | PRN

## 2017-06-30 MED ORDER — PROPOFOL 10 MG/ML IV BOLUS
INTRAVENOUS | Status: DC | PRN
  Administered 2017-06-30: 150 mg via INTRAVENOUS

## 2017-06-30 MED ORDER — HEPARIN SODIUM (PORCINE) 1000 UNIT/ML IJ SOLN
INTRAMUSCULAR | Status: AC
  Filled 2017-06-30: qty 1

## 2017-06-30 MED ORDER — ONDANSETRON HCL 4 MG/2ML IJ SOLN: 4.0000 mg | Freq: Four times a day (QID) | INTRAMUSCULAR | Status: DC | PRN

## 2017-06-30 MED ORDER — ROCURONIUM BROMIDE 100 MG/10ML IV SOLN
INTRAVENOUS | Status: DC | PRN
  Administered 2017-06-30 (×3): 20 mg via INTRAVENOUS
  Administered 2017-06-30: 40 mg via INTRAVENOUS

## 2017-06-30 MED ORDER — BUPIVACAINE HCL (PF) 0.25 % IJ SOLN
INTRAMUSCULAR | Status: DC | PRN
  Administered 2017-06-30: 20 mL

## 2017-06-30 MED ORDER — HEPARIN (PORCINE) IN NACL 2-0.9 UNIT/ML-% IJ SOLN
INTRAMUSCULAR | Status: AC
  Filled 2017-06-30: qty 500

## 2017-06-30 MED ORDER — LIDOCAINE HCL (CARDIAC) 20 MG/ML IV SOLN
INTRAVENOUS | Status: DC | PRN
  Administered 2017-06-30: 60 mg via INTRATRACHEAL

## 2017-06-30 MED ORDER — LIDOCAINE HCL (PF) 1 % IJ SOLN
INTRAMUSCULAR | Status: AC
  Filled 2017-06-30: qty 30

## 2017-06-30 MED ORDER — SODIUM CHLORIDE 0.9 % IV SOLN
INTRAVENOUS | Status: DC
  Administered 2017-06-30 (×2): via INTRAVENOUS

## 2017-06-30 MED ORDER — SUCCINYLCHOLINE CHLORIDE 20 MG/ML IJ SOLN
INTRAMUSCULAR | Status: DC | PRN
  Administered 2017-06-30: 140 mg via INTRAVENOUS

## 2017-06-30 MED ORDER — ONDANSETRON HCL 4 MG/2ML IJ SOLN
INTRAMUSCULAR | Status: DC | PRN
  Administered 2017-06-30: 4 mg via INTRAVENOUS

## 2017-06-30 MED ORDER — INSULIN ASPART 100 UNIT/ML ~~LOC~~ SOLN
0.0000 [IU] | Freq: Three times a day (TID) | SUBCUTANEOUS | Status: DC
  Administered 2017-06-30: 3 [IU] via SUBCUTANEOUS
  Administered 2017-07-01 (×2): 2 [IU] via SUBCUTANEOUS

## 2017-06-30 MED ORDER — SODIUM CHLORIDE 0.9 % IV SOLN: 250.0000 mL | INTRAVENOUS | Status: DC | PRN

## 2017-06-30 MED ORDER — MIDAZOLAM HCL 5 MG/5ML IJ SOLN
INTRAMUSCULAR | Status: DC | PRN
  Administered 2017-06-30: 2 mg via INTRAVENOUS

## 2017-06-30 MED ORDER — SODIUM CHLORIDE 0.9% FLUSH
3.0000 mL | Freq: Two times a day (BID) | INTRAVENOUS | Status: DC
  Administered 2017-06-30 – 2017-07-01 (×2): 3 mL via INTRAVENOUS

## 2017-06-30 MED ORDER — HEPARIN (PORCINE) IN NACL 2-0.9 UNIT/ML-% IJ SOLN
INTRAMUSCULAR | Status: AC | PRN
  Administered 2017-06-30: 500 mL

## 2017-06-30 MED ORDER — GLIPIZIDE 5 MG PO TABS
5.0000 mg | ORAL_TABLET | Freq: Two times a day (BID) | ORAL | Status: DC
  Administered 2017-06-30 – 2017-07-01 (×2): 5 mg via ORAL
  Filled 2017-06-30 (×2): qty 1

## 2017-06-30 MED ORDER — HEPARIN SODIUM (PORCINE) 1000 UNIT/ML IJ SOLN
INTRAMUSCULAR | Status: DC | PRN
  Administered 2017-06-30: 12000 [IU] via INTRAVENOUS
  Administered 2017-06-30: 1000 [IU] via INTRAVENOUS

## 2017-06-30 MED ORDER — PHENYLEPHRINE HCL 10 MG/ML IJ SOLN
INTRAVENOUS | Status: DC | PRN
  Administered 2017-06-30: 15 ug/min via INTRAVENOUS

## 2017-06-30 MED ORDER — HEPARIN SODIUM (PORCINE) 1000 UNIT/ML IJ SOLN
INTRAMUSCULAR | Status: DC | PRN
  Administered 2017-06-30: 4000 [IU] via INTRAVENOUS
  Administered 2017-06-30: 3000 [IU] via INTRAVENOUS
  Administered 2017-06-30: 5000 [IU] via INTRAVENOUS

## 2017-06-30 MED ORDER — IOPAMIDOL (ISOVUE-370) INJECTION 76%
INTRAVENOUS | Status: DC | PRN
  Administered 2017-06-30: 3 mL via INTRAVENOUS

## 2017-06-30 MED ORDER — HYDROCODONE-ACETAMINOPHEN 5-325 MG PO TABS: 1.0000 | ORAL_TABLET | ORAL | Status: DC | PRN

## 2017-06-30 SURGICAL SUPPLY — 18 items
BAG SNAP BAND KOVER 36X36 (MISCELLANEOUS) ×2 IMPLANT
BLANKET WARM UNDERBOD FULL ACC (MISCELLANEOUS) ×2 IMPLANT
CATH MAPPNG PENTARAY F 2-6-2MM (CATHETERS) ×1 IMPLANT
CATH SMTCH THERMOCOOL SF DF (CATHETERS) ×2 IMPLANT
CATH SOUNDSTAR ECO REPROCESSED (CATHETERS) ×2 IMPLANT
CATH WEBSTER BI DIR CS D-F CRV (CATHETERS) ×2 IMPLANT
COVER SWIFTLINK CONNECTOR (BAG) ×2 IMPLANT
NEEDLE TRANSEP BRK 71CM 407200 (NEEDLE) ×2 IMPLANT
PACK EP LATEX FREE (CUSTOM PROCEDURE TRAY) ×1
PACK EP LF (CUSTOM PROCEDURE TRAY) ×1 IMPLANT
PAD DEFIB LIFELINK (PAD) ×2 IMPLANT
PATCH CARTO3 (PAD) ×2 IMPLANT
PENTARAY F 2-6-2MM (CATHETERS) ×2
SHEATH AVANTI 11F 11CM (SHEATH) ×2 IMPLANT
SHEATH PINNACLE 7F 10CM (SHEATH) ×4 IMPLANT
SHEATH PINNACLE 9F 10CM (SHEATH) ×2 IMPLANT
SHEATH SWARTZ TS SL2 63CM 8.5F (SHEATH) ×2 IMPLANT
TUBING SMART ABLATE COOLFLOW (TUBING) ×2 IMPLANT

## 2017-06-30 NOTE — Anesthesia Postprocedure Evaluation (Signed)
Anesthesia Post Note  Patient: Kelly Lang  Procedure(s) Performed: Procedure(s) (LRB): Atrial Fibrillation Ablation (N/A)     Patient location during evaluation: PACU Anesthesia Type: General Level of consciousness: awake and alert Pain management: pain level controlled Vital Signs Assessment: post-procedure vital signs reviewed and stable Respiratory status: spontaneous breathing, nonlabored ventilation, respiratory function stable and patient connected to nasal cannula oxygen Cardiovascular status: blood pressure returned to baseline and stable Postop Assessment: no signs of nausea or vomiting Anesthetic complications: no    Last Vitals:  Vitals:   06/30/17 1152 06/30/17 1214  BP:  103/68  Pulse:  86  Resp:  16  Temp: (!) 36.3 C 36.9 C    Last Pain:  Vitals:   06/30/17 1214  TempSrc: Axillary                 Burley Kopka P Charliegh Vasudevan

## 2017-06-30 NOTE — Progress Notes (Signed)
Site area: rt groin 3 fv sheaths Site Prior to Removal:  Level 0 Pressure Applied For: 20 minutes Manual:   yes Patient Status During Pull:  stable Post Pull Site:  Level 0 Post Pull Instructions Given:  yes Post Pull Pulses Present: palpable Dressing Applied:  Gauze and tegaderm Bedrest begins @ 1150 Comments: IV saline locked

## 2017-06-30 NOTE — Anesthesia Preprocedure Evaluation (Addendum)
Anesthesia Evaluation  Patient identified by MRN, date of birth, ID band Patient awake    Reviewed: Allergy & Precautions, H&P , NPO status , Patient's Chart, lab work & pertinent test results, reviewed documented beta blocker date and time   History of Anesthesia Complications Negative for: history of anesthetic complications  Airway Mallampati: II  TM Distance: >3 FB Neck ROM: Full    Dental  (+) Dental Advisory Given, Poor Dentition   Pulmonary former smoker,    Pulmonary exam normal breath sounds clear to auscultation       Cardiovascular hypertension, Pt. on medications and Pt. on home beta blockers + Peripheral Vascular Disease and +CHF  + dysrhythmias (INR 2.3) Atrial Fibrillation  Rhythm:Irregular Rate:Normal  ECG: A-fib, rate 76  ECHO: - Left ventricle: Systolic function was mildly to moderately reduced. The estimated ejection fraction was in the range of 40% to 45%. Diffuse hypokinesis. Left atrium: The atrium was mildly dilated.   Neuro/Psych CVA, No Residual Symptoms    GI/Hepatic negative GI ROS, Neg liver ROS,   Endo/Other  diabetes, Oral Hypoglycemic Agents, Insulin DependentHypothyroidism Morbid obesity  Renal/GU negative Renal ROS     Musculoskeletal  (+) Arthritis ,   Abdominal (+) + obese,   Peds  Hematology   Anesthesia Other Findings Dyslipidemia  Reproductive/Obstetrics                            Anesthesia Physical  Anesthesia Plan  ASA: III  Anesthesia Plan: General   Post-op Pain Management:    Induction: Intravenous  PONV Risk Score and Plan: 3 and Ondansetron, Dexamethasone, Midazolam and Propofol infusion  Airway Management Planned: Oral ETT  Additional Equipment:   Intra-op Plan:   Post-operative Plan: Extubation in OR  Informed Consent: I have reviewed the patients History and Physical, chart, labs and discussed the procedure including the  risks, benefits and alternatives for the proposed anesthesia with the patient or authorized representative who has indicated his/her understanding and acceptance.   Dental advisory given  Plan Discussed with: Surgeon and CRNA  Anesthesia Plan Comments:        Anesthesia Quick Evaluation

## 2017-06-30 NOTE — H&P (View-Only) (Signed)
Electrophysiology Office Note   Date:  06/08/2017   ID:  Kelly Lang, DOB [birth year], MRN [id number]  PCP:  Ladell Pier, MD  Cardiologist:  DR Nahser Primary Electrophysiologist: Thompson Grayer, MD    Chief Complaint  Patient presents with  . Atrial Fibrillation     History of Present Illness: Kelly Lang is a 56 y.o. female who presents today for electrophysiology evaluation.   She is referred by Dr Acie Fredrickson and Roderic Palau NP from the AF clinic for further evaluation and management of afib.  She reports that she was initially diagnosed with afib in 2014.  She has had increasing frequency and duration of afib since that time.  She reports symptoms of fatigue and decreased exercise tolerance with her afib.  She has recently failed medical therapy with flecainide.  Complications of her afib have included prior stroke and presumed tachycardia mediated cardiomyopathy.  Today, she denies symptoms of palpitations, chest pain, orthopnea, PND, lower extremity edema, claudication, dizziness, presyncope, syncope, bleeding, or neurologic sequela. The patient is tolerating medications without difficulties and is otherwise without complaint today.    Past Medical History:  Diagnosis Date  . Allergy   . Arthritis   . Chicken pox   . Diabetes mellitus ([place])    a. hg A1c 10, newly diagnosed (10/26/13)  . Dyslipidemia   . H/O: hypothyroidism    a. thyroid biopsy 1996 with synthroid. Stopped taking rx and was loss to follow up b. normal thyroid function 10/20/13  . Hx of cardiovascular stress test    ETT/Lexiscan Myoview (12/2013):  No ischemia; not gated; low risk.  Marland Kitchen Hypertension   . Obesity   . Persistent atrial fibrillation ([place])    a diagnosed 10/25/2013  . Stroke ([place])   . 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 2/2 Afib with RVR for unknown duration (EF: 40-45%)  . Thyroid disease    Past Surgical History:  Procedure Laterality Date  . BIOPSY THYROID     1996  .  CARDIOVERSION N/A 12/07/2013   Procedure: CARDIOVERSION;  Surgeon: Casandra Doffing, MD;  Location: [facility];  Service: Cardiovascular;  Laterality: N/A;  . CARDIOVERSION N/A 03/10/2016   Procedure: CARDIOVERSION;  Surgeon: Thayer Headings, MD;  Location: [place];  Service: Cardiovascular;  Laterality: N/A;  . EYE MUSCLE SURGERY Right ~ 1966  . TONSILLECTOMY  1971     Current Outpatient Prescriptions  Medication Sig Dispense Refill  . Blood Glucose Monitoring Suppl (ONE TOUCH ULTRA 2) w/Device KIT 1 application by Does not apply route 2 (two) times daily. 1 each 0  . diclofenac sodium (VOLTAREN) 1 % GEL Apply 2 g topically 4 (four) times daily. 100 g 0  . empagliflozin (JARDIANCE) 10 MG TABS tablet Take 10 mg by mouth daily. 30 tablet 5  . glipiZIDE (GLUCOTROL) 5 MG tablet Take 1 tablet (5 mg total) by mouth 2 (two) times daily before a meal. 60 tablet 11  . glucose blood (ONE TOUCH TEST STRIPS) test strip Use as instructed 100 each 12  . Insulin Glargine (LANTUS SOLOSTAR) 100 UNIT/ML Solostar Pen Inject 50 Units into the skin daily at 10 pm. 3 mL 6  . Insulin Pen Needle (ULTICARE MICRO PEN NEEDLES) 32G X 4 MM MISC 1 applicator by Does not apply route at bedtime. 100 each 2  . Lancets (ONETOUCH ULTRASOFT) lancets Use as instructed 100 each 12  . lisinopril (PRINIVIL,ZESTRIL) 10 MG tablet Take 0.5 tablets (5 mg total) by mouth daily. Fletcher  tablet 3  . lovastatin (MEVACOR) 20 MG tablet Take 1 tablet (20 mg total) by mouth at bedtime. 90 tablet 3  . metFORMIN (GLUCOPHAGE) 1000 MG tablet Take 1 tablet (1,000 mg total) by mouth 2 (two) times daily with a meal. 60 tablet 2  . metoprolol tartrate (LOPRESSOR) 25 MG tablet TAKE 1 & 1/2 TABLET BY MOUTH 2 TIMES DAILY 90 tablet 6  . rivaroxaban (XARELTO) 20 MG TABS tablet Take 1 tablet (20 mg total) by mouth daily with supper. 30 tablet 11   Current Facility-Administered Medications  Medication Dose Route Frequency Provider Last Rate Last Dose  . 0.9 %   sodium chloride infusion  500 mL Intravenous Continuous Ladene Artist, MD        Allergies:   Codeine   Social History:  The patient  reports that she quit smoking about 6 years ago. Her smoking use included Cigarettes. She has a 22.00 pack-year smoking history. She has never used smokeless tobacco. She reports that she drinks alcohol. She reports that she does not use drugs.   Family History:  The patient was adopted and is unaware of her family history   ROS:  Please see the history of present illness.   All other systems are personally reviewed and negative.    PHYSICAL EXAM: VS:  BP 120/68   Pulse 76   Ht 5' 5.5" (1.664 m)   Wt 253 lb (114.8 kg)   SpO2 98%   BMI 41.46 kg/m  , BMI Body mass index is 41.46 kg/m. GEN: overweight, in no acute distress  HEENT: normal  Neck: no JVD, carotid bruits, or masses Cardiac: iRRR; no murmurs, rubs, or gallops,no edema  Respiratory:  clear to auscultation bilaterally, normal work of breathing GI: soft, nontender, nondistended, + BS MS: no deformity or atrophy  Skin: warm and dry  Neuro:  Strength and sensation are intact Psych: euthymic mood, full affect  EKG:  EKG is ordered today. The ekg ordered today is personally reviewed and shows afib, V rate 76 bpm, Qtc 436 msec   Recent Labs: 05/11/2017: ALT 25; BUN 9; Creatinine, Ser 0.65; Hemoglobin 14.5; Platelets 256; Potassium 4.8; Sodium 136  personally reviewed   Lipid Panel     Component Value Date/Time   CHOL 156 11/19/2016 1103   TRIG 296 (H) 11/19/2016 1103   HDL 30 (L) 11/19/2016 1103   CHOLHDL 5.2 (H) 11/19/2016 1103   VLDL 59 (H) 11/19/2016 1103   LDLCALC 67 11/19/2016 1103   LDLDIRECT 114.0 02/13/2015 0750   personally reviewed   Wt Readings from Last 3 Encounters:  06/08/17 253 lb (114.8 kg)  05/25/17 255 lb 12.8 oz (116 kg)  05/18/17 251 lb (113.9 kg)      Other studies personally reviewed: Additional studies/ records that were reviewed today include: AF  clinic notes, echo from 2017, Dr Lanny Hurst records  Review of the above records today demonstrates: as above   ASSESSMENT AND PLAN:  1.  Persistent afib The patient has symptomatic persistent afib.  She has failed medical therapy with flecainide.  Therapeutic strategies for afib including medicine (tikosyn) and ablation were discussed in detail with the patient today. Risk, benefits, and alternatives to EP study and radiofrequency ablation for afib were also discussed in detail today. These risks include but are not limited to stroke, bleeding, vascular damage, tamponade, perforation, damage to the esophagus, lungs, and other structures, pulmonary vein stenosis, worsening renal function, and death. The patient understands these risk and wishes  to proceed.  We will therefore proceed with catheter ablation oat the next available time.  Will plan TEE prior to ablation to exclude thrombus.  2 D echo at this time to evaluate EF and LA size. chads2vasc score is at least 6.  The importance of anticoagulation compliance was discussed with the patient today  2. HTN Stable No change required today  3. Prior stroke chads2vasc score is at least 6.  The importance of anticoagulation compliance was discussed with the patient today TEE prior to ablation  4. Obesity Body mass index is 41.46 kg/m. Lifestyle modification would be beneficial to maintaining sinus rhythm long term  5. Nonischemic CM Presumed to be due to afib Update echo [facility] would suggest that she may receive additional benefit with ablation  Current medicines are reviewed at length with the patient today.   The patient does not have concerns regarding her medicines.  The following changes were made today:  none    Signed, Thompson Grayer, MD  06/08/2017 9:08 AM     [facility] [address] [place] [address] [phone] (office) [phone] (fax)

## 2017-06-30 NOTE — Transfer of Care (Signed)
Immediate Anesthesia Transfer of Care Note  Patient: Kelly Lang  Procedure(s) Performed: Procedure(s): Atrial Fibrillation Ablation (N/A)  Patient Location: Cath Lab  Anesthesia Type:General  Level of Consciousness: awake, alert  and oriented  Airway & Oxygen Therapy: Patient Spontanous Breathing and Patient connected to nasal cannula oxygen  Post-op Assessment: Report given to RN, Post -op Vital signs reviewed and stable and Patient moving all extremities X 4  Post vital signs: Reviewed and stable  Last Vitals:  Vitals:   06/30/17 0551 06/30/17 1029  BP: 129/71   Pulse: (!) 56   Resp: 18   Temp: 37.1 C (!) 36.4 C    Last Pain:  Vitals:   06/30/17 1029  TempSrc: Temporal         Complications: No apparent anesthesia complications

## 2017-06-30 NOTE — Discharge Instructions (Signed)
No driving for 4 days. No lifting over 5 lbs for 1 week. No vigorous or sexual activity for 1 week. You may return to work on 07/07/17. Keep procedure site clean & dry. If you notice increased pain, swelling, bleeding or pus, call/return!  You may shower, but no soaking baths/hot tubs/pools for 1 week.   You have an appointment set up with the Atrial Fibrillation Clinic.  Multiple studies have shown that being followed by a dedicated atrial fibrillation clinic in addition to the standard care you receive from your other physicians improves health. We believe that enrollment in the atrial fibrillation clinic will allow [place] to better care for you.   The phone number to the Atrial Fibrillation Clinic is [phone]. The clinic is staffed Monday through Friday from 8:30am to 5pm.  Parking Directions: The clinic is located in the Heart and Vascular Building connected to [facility]. 1)From [address] turn on to [address] and go to the 3rd entrance  (Heart and Vascular entrance) on the right. 2)Look to the right for Heart &Vascular Parking Garage. 3)A code for the entrance is required please call the clinic to receive this.   4)Take the elevators to the 1st floor. Registration is in the room with the glass walls at the end of the hallway.  If you have any trouble parking or locating the clinic, please dont hesitate to call [phone].

## 2017-06-30 NOTE — Interval H&P Note (Signed)
History and Physical Interval Note:  06/30/2017 7:17 AM  Kelly Lang  has presented today for surgery, with the diagnosis of a-fib  The various methods of treatment have been discussed with the patient and family. After consideration of risks, benefits and other options for treatment, the patient has consented to  Procedure(s): Atrial Fibrillation Ablation (N/A) as a surgical intervention .  The patient's history has been reviewed, patient examined, no change in status, stable for surgery.  I have reviewed the patient's chart and labs.  Questions were answered to the patient's satisfaction.     Hillis Range

## 2017-06-30 NOTE — Anesthesia Procedure Notes (Signed)
Procedure Name: Intubation Date/Time: 06/30/2017 7:54 AM Performed by: Marena Chancy Pre-anesthesia Checklist: Patient identified, Emergency Drugs available, Suction available and Patient being monitored Patient Re-evaluated:Patient Re-evaluated prior to induction Oxygen Delivery Method: Circle System Utilized Preoxygenation: Pre-oxygenation with 100% oxygen Induction Type: IV induction Ventilation: Mask ventilation without difficulty Laryngoscope Size: McGraph and 3 Grade View: Grade I Tube type: Oral Tube size: 7.0 mm Number of attempts: 1 Airway Equipment and Method: Stylet and Oral airway Placement Confirmation: ETT inserted through vocal cords under direct vision,  positive ETCO2 and breath sounds checked- equal and bilateral Tube secured with: Tape Dental Injury: Teeth and Oropharynx as per pre-operative assessment

## 2017-06-30 NOTE — Discharge Summary (Signed)
ELECTROPHYSIOLOGY PROCEDURE DISCHARGE SUMMARY    Patient ID: Kelly Lang,  MRN: [id number], DOB/AGE: October 03, 1961 56 y.o.  Admit date: 06/30/2017 Discharge date: 07/01/17  Primary Care Physician: Ladell Pier, MD  Primary Cardiologist: Dr. Acie Fredrickson Electrophysiologist: Thompson Grayer, MD  Primary Discharge Diagnosis:  1. Persistent AFib     CHA2DS2Vasc is at least 5, on Xarelto  Secondary Discharge Diagnosis:  1. HTN 2. CVA 3. NICM     Felt 2/2 AF  Procedures This Admission:  1.  Electrophysiology study and radiofrequency catheter ablation on 06/30/17 by Dr Thompson Grayer.   This study demonstrated  CONCLUSIONS: 1. Atrial fibrillation upon presentation.   2. Intracardiac echo reveals a moderate sized left atrium with four separate pulmonary veins without evidence of pulmonary vein stenosis. 3. Successful electrical isolation and anatomical encircling of all four pulmonary veins with radiofrequency current.  A WACA approach was used 3. Additional left atrial ablation was performed with a standard box lesion created along the posterior wall of the left atrium 4. Atrial fibrillation successfully cardioverted to sinus rhythm. 5. No early apparent complications.   Brief HPI: Kelly Lang is a 56 y.o. female with a history of persistent atrial fibrillation.  They have failed medical therapy with Flecainide. Risks, benefits, and alternatives to catheter ablation of atrial fibrillation were reviewed with the patient who wished to proceed.  The patient underwent TEE prior to the procedure which demonstrated depressed LVEF and no LAA thrombus.    Hospital Course:  The patient was admitted and underwent EPS/RFCA of atrial fibrillation with details as outlined above.  She was monitored on telemetry overnight which demonstrated SR/70's.  Right groin was without complication on the day of discharge.  The patient was examined by Dr. Rayann Heman and considered to be stable for discharge.  Wound  care and restrictions were reviewed with the patient.  The patient will be seen back by Roderic Palau, NP in 4 weeks and Dr Rayann Heman in 12 weeks for post ablation follow up.     Physical Exam: Vitals:   06/30/17 1400 06/30/17 1500 06/30/17 2022 07/01/17 0510  BP: (!) 114/93 135/73 127/79 131/76  Pulse: 86 90 87 89  Resp: 17 18 (!) 24 (!) 24  Temp:   98.9 F (37.2 C) 98.6 F (37 C)  TempSrc:   Oral Oral  SpO2: 98% 96% 96% 98%  Weight:    249 lb 8 oz (113.2 kg)  Height:        GEN- The patient is well appearing, alert and oriented x 3 today.   HEENT: normocephalic, atraumatic; sclera clear, conjunctiva pink; hearing intact; oropharynx clear; neck supple  Lungs- CTA b/l, normal work of breathing.  No wheezes, rales, rhonchi Heart- RRR, no murmurs, rubs or gallops  GI- soft, non-tender, non-distended, obese Extremities- no clubbing, cyanosis, or edema; DP/PT/radial pulses 2+ bilaterally, right groin without hematoma/bruit MS- no significant deformity or atrophy Skin- warm and dry, no rash or lesion Psych- euthymic mood, full affect Neuro- strength and sensation are intact   Labs:   Lab Results  Component Value Date   WBC 5.4 06/30/2017   HGB 14.4 06/30/2017   HCT 44.1 06/30/2017   MCV 97.1 06/30/2017   PLT 212 06/30/2017     Recent Labs Lab 06/30/17 0633  NA 134*  K 4.1  CL 106  CO2 20*  BUN 14  CREATININE 0.72  CALCIUM 8.5*  GLUCOSE 133*     Discharge Medications:  Allergies as of 07/01/2017  Reactions   Codeine Itching, Rash      Medication List    TAKE these medications   diclofenac sodium 1 % Gel Commonly known as:  VOLTAREN Apply 2 g topically 4 (four) times daily. What changed:  when to take this  reasons to take this   empagliflozin 10 MG Tabs tablet Commonly known as:  JARDIANCE Take 10 mg by mouth daily.   glipiZIDE 5 MG tablet Commonly known as:  GLUCOTROL Take 1 tablet (5 mg total) by mouth 2 (two) times daily before a meal.     glucose blood test strip Commonly known as:  ONE TOUCH TEST STRIPS Use as instructed   Insulin Glargine 100 UNIT/ML Solostar Pen Commonly known as:  LANTUS SOLOSTAR Inject 50 Units into the skin daily at 10 pm.   Insulin Pen Needle 32G X 4 MM Misc Commonly known as:  ULTICARE MICRO PEN NEEDLES 1 applicator by Does not apply route at bedtime.   lisinopril 10 MG tablet Commonly known as:  PRINIVIL,ZESTRIL Take 0.5 tablets (5 mg total) by mouth daily.   lovastatin 20 MG tablet Commonly known as:  MEVACOR Take 1 tablet (20 mg total) by mouth at bedtime.   metFORMIN 1000 MG tablet Commonly known as:  GLUCOPHAGE Take 1 tablet (1,000 mg total) by mouth 2 (two) times daily with a meal.   metoprolol tartrate 25 MG tablet Commonly known as:  LOPRESSOR TAKE 1 & 1/2 TABLET BY MOUTH 2 TIMES DAILY   ONE TOUCH ULTRA 2 w/Device Kit 1 application by Does not apply route 2 (two) times daily.   onetouch ultrasoft lancets Use as instructed   pantoprazole 40 MG tablet Commonly known as:  PROTONIX Take 1 tablet (40 mg total) by mouth daily.   rivaroxaban 20 MG Tabs tablet Commonly known as:  XARELTO Take 1 tablet (20 mg total) by mouth daily with supper.       Disposition:  Home  Follow-up Information    [facility] Follow up on 07/27/2017.   Specialty:  Cardiology Why:  10:30AM Contact information: [address] [id number] [place] [phone]       Thompson Grayer, MD Follow up on 09/28/2017.   Specialty:  Cardiology Why:  10:00AM Contact information: [place] [address] [phone]           Duration of Discharge Encounter: Greater than 30 minutes including physician time.  Venetia Night, PA-C 07/01/2017 8:25 AM   Doing well s/p ablation DC to home Routine follow-up  Thompson Grayer MD, [facility] 07/01/2017 9:11 AM

## 2017-07-01 ENCOUNTER — Encounter (HOSPITAL_COMMUNITY): Payer: Self-pay | Admitting: Internal Medicine

## 2017-07-01 DIAGNOSIS — E039 Hypothyroidism, unspecified: Secondary | ICD-10-CM | POA: Diagnosis not present

## 2017-07-01 DIAGNOSIS — Z87891 Personal history of nicotine dependence: Secondary | ICD-10-CM | POA: Diagnosis not present

## 2017-07-01 DIAGNOSIS — I481 Persistent atrial fibrillation: Secondary | ICD-10-CM | POA: Diagnosis not present

## 2017-07-01 DIAGNOSIS — Z8673 Personal history of transient ischemic attack (TIA), and cerebral infarction without residual deficits: Secondary | ICD-10-CM | POA: Diagnosis not present

## 2017-07-01 DIAGNOSIS — E785 Hyperlipidemia, unspecified: Secondary | ICD-10-CM | POA: Diagnosis not present

## 2017-07-01 DIAGNOSIS — Z7901 Long term (current) use of anticoagulants: Secondary | ICD-10-CM | POA: Diagnosis not present

## 2017-07-01 DIAGNOSIS — Z6841 Body Mass Index (BMI) 40.0 and over, adult: Secondary | ICD-10-CM | POA: Diagnosis not present

## 2017-07-01 DIAGNOSIS — M1990 Unspecified osteoarthritis, unspecified site: Secondary | ICD-10-CM | POA: Diagnosis not present

## 2017-07-01 DIAGNOSIS — E119 Type 2 diabetes mellitus without complications: Secondary | ICD-10-CM | POA: Diagnosis not present

## 2017-07-01 DIAGNOSIS — I429 Cardiomyopathy, unspecified: Secondary | ICD-10-CM | POA: Diagnosis not present

## 2017-07-01 DIAGNOSIS — Z794 Long term (current) use of insulin: Secondary | ICD-10-CM | POA: Diagnosis not present

## 2017-07-01 DIAGNOSIS — I10 Essential (primary) hypertension: Secondary | ICD-10-CM | POA: Diagnosis not present

## 2017-07-01 LAB — GLUCOSE, CAPILLARY
Glucose-Capillary: 170 mg/dL — ABNORMAL HIGH (ref 65–99)
Glucose-Capillary: 173 mg/dL — ABNORMAL HIGH (ref 65–99)

## 2017-07-01 MED ORDER — PANTOPRAZOLE SODIUM 40 MG PO TBEC: 40.0000 mg | DELAYED_RELEASE_TABLET | Freq: Every day | ORAL | 0 refills | Status: DC

## 2017-07-01 MED ORDER — HYDROMORPHONE HCL 1 MG/ML IJ SOLN: 0.2500 mg | INTRAMUSCULAR | Status: DC | PRN

## 2017-07-01 MED ORDER — PROMETHAZINE HCL 25 MG/ML IJ SOLN: 6.2500 mg | INTRAMUSCULAR | Status: DC | PRN

## 2017-07-01 NOTE — Plan of Care (Signed)
Problem: Physical Regulation: Goal: Ability to maintain clinical measurements within normal limits will improve Outcome: Progressing Patient remains in SR after ablation. No complaints of pain. Right groin site Level 0.

## 2017-07-08 MED FILL — JARDIANCE 10 MG TABLET: 10 | 30 days supply | Qty: 30 | Fill #2

## 2017-07-08 MED FILL — glipiZIDE 5 MG TABS: 5 | 30 days supply | Qty: 60 | Fill #1

## 2017-07-08 MED FILL — LANTUS SOLOSTAR 100 UNITS/M: 100 | 30 days supply | Qty: 15 | Fill #0

## 2017-07-13 ENCOUNTER — Encounter: Payer: Self-pay | Admitting: Internal Medicine

## 2017-07-13 ENCOUNTER — Ambulatory Visit: Payer: BLUE CROSS/BLUE SHIELD | Attending: Internal Medicine | Admitting: Internal Medicine

## 2017-07-13 VITALS — BP 123/84 | HR 69 | Temp 98.5°F | Resp 16 | Wt 251.6 lb

## 2017-07-13 DIAGNOSIS — Z8673 Personal history of transient ischemic attack (TIA), and cerebral infarction without residual deficits: Secondary | ICD-10-CM | POA: Diagnosis not present

## 2017-07-13 DIAGNOSIS — Z9889 Other specified postprocedural states: Secondary | ICD-10-CM | POA: Insufficient documentation

## 2017-07-13 DIAGNOSIS — Z794 Long term (current) use of insulin: Secondary | ICD-10-CM | POA: Insufficient documentation

## 2017-07-13 DIAGNOSIS — I10 Essential (primary) hypertension: Secondary | ICD-10-CM

## 2017-07-13 DIAGNOSIS — E785 Hyperlipidemia, unspecified: Secondary | ICD-10-CM | POA: Diagnosis not present

## 2017-07-13 DIAGNOSIS — E118 Type 2 diabetes mellitus with unspecified complications: Secondary | ICD-10-CM | POA: Diagnosis not present

## 2017-07-13 DIAGNOSIS — Z87891 Personal history of nicotine dependence: Secondary | ICD-10-CM | POA: Insufficient documentation

## 2017-07-13 DIAGNOSIS — I4891 Unspecified atrial fibrillation: Secondary | ICD-10-CM | POA: Diagnosis not present

## 2017-07-13 DIAGNOSIS — E039 Hypothyroidism, unspecified: Secondary | ICD-10-CM | POA: Diagnosis not present

## 2017-07-13 DIAGNOSIS — I110 Hypertensive heart disease with heart failure: Secondary | ICD-10-CM | POA: Insufficient documentation

## 2017-07-13 DIAGNOSIS — I5022 Chronic systolic (congestive) heart failure: Secondary | ICD-10-CM | POA: Diagnosis not present

## 2017-07-13 DIAGNOSIS — Z6841 Body Mass Index (BMI) 40.0 and over, adult: Secondary | ICD-10-CM

## 2017-07-13 DIAGNOSIS — Z79899 Other long term (current) drug therapy: Secondary | ICD-10-CM | POA: Diagnosis not present

## 2017-07-13 DIAGNOSIS — Z7901 Long term (current) use of anticoagulants: Secondary | ICD-10-CM | POA: Diagnosis not present

## 2017-07-13 DIAGNOSIS — E119 Type 2 diabetes mellitus without complications: Secondary | ICD-10-CM

## 2017-07-13 DIAGNOSIS — I4892 Unspecified atrial flutter: Secondary | ICD-10-CM | POA: Diagnosis not present

## 2017-07-13 LAB — GLUCOSE, POCT (MANUAL RESULT ENTRY): POC GLUCOSE: 165 mg/dL — AB (ref 70–99)

## 2017-07-13 MED ORDER — INSULIN GLARGINE 100 UNIT/ML SOLOSTAR PEN: 53.0000 [IU] | PEN_INJECTOR | Freq: Every day | SUBCUTANEOUS | 6 refills | Status: DC

## 2017-07-13 NOTE — Patient Instructions (Signed)
Start walking 20 mins 2-3 times a week for exercise once cardiologist says it is okay to start doing so.   Follow a Healthy Eating Plan - You can do it! Limit sugary drinks.  Avoid sodas, sweet tea, sport or energy drinks, or fruit drinks.  Drink water, lo-fat milk, or diet drinks. Limit snack foods.   Cut back on candy, cake, cookies, chips, ice cream.  These are a special treat, only in small amounts. Eat plenty of vegetables.  Especially dark green, red, and orange vegetables. Aim for at least 3 servings a day. More is better! Include fruit in your daily diet.  Whole fruit is much healthier than fruit juice! Limit "white" bread, "white" pasta, "white" rice.   Choose "100% whole grain" products, brown or wild rice. Avoid fatty meats. Try "Meatless Monday" and choose eggs or beans one day a week.  When eating meat, choose lean meats like chicken, [place], and fish.  Grill, broil, or bake meats instead of frying, and eat poultry without the skin. Eat less salt.  Avoid frozen pizzas, frozen dinners and salty foods.  Use seasonings other than salt in cooking.  This can help blood pressure and keep you from swelling Beer, wine and liquor have calories.  If you can safely drink alcohol, limit to 1 drink per day for women, 2 drinks for men

## 2017-07-13 NOTE — Progress Notes (Signed)
Patient ID: Kelly Lang, female    DOB: 1961-10-03  MRN: [id number]  CC: Follow-up   Subjective: Kelly Lang is a 56 y.o. female who presents for chronic ds management. Her concerns today include:  Pt with hx of a.flutter, DM type 2 , obesity, HL, HTN, chronic systolic CHF with EF 75%, CVA. Last saw me 05/11/2017.   1. Afib/flutter: -had radio frequency ablation on 06/30/2017.  Off Flecainide -since then, she had one episode of palpitations "I feel so much better though. I can walk without feeling so SOB." Has a.fib clinic appt 8/23  2. DM:  On Jardiance from last visit Before ablation a.m BS 130s and before dinner 90s range.   -BS increased for about 1 wk post ablation Self increase Lantus to 53 units once a day because of this  BS last evening before dinner was 152 and this a.m 121.  Did not take meds as yet this morning because did not eat -due for eye exam.  -plans to start walking for exercise once cardiologist gives her Okay. Was told to take it easy for a wk or 2 after ablation.   3.  Not aching in her legs and arms is much compared to last visit.   "Voltaren gel works."  Patient Active Problem List   Diagnosis Date Noted  . Chronic systolic CHF (congestive heart failure) ([place]) 09/16/2015  . 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 Thyroid mass 09/15/2015  . Low back pain 04/03/2014  . Unspecified vitamin D deficiency 01/20/2014  . Essential hypertension, benign 01/20/2014  . Chronic anticoagulation 10/31/2013  . Tachycardia induced cardiomyopathy ([place])   . H/O: hypothyroidism   . Obesity   . IDDM (insulin dependent diabetes mellitus) ([place])   . Arthritis   . Dyslipidemia   . Atrial fibrillation  10/25/2013     Current Outpatient Prescriptions on File Prior to Visit  Medication Sig Dispense Refill  . Blood Glucose Monitoring Suppl (ONE TOUCH ULTRA 2) w/Device KIT 1 application by Does not apply route 2 (two)  times daily. 1 each 0  . diclofenac sodium (VOLTAREN) 1 % GEL Apply 2 g topically 4 (four) times daily. (Patient taking differently: Apply 2 g topically daily as needed (pain). ) 100 g 0  . empagliflozin (JARDIANCE) 10 MG TABS tablet Take 10 mg by mouth daily. 30 tablet 5  . glipiZIDE (GLUCOTROL) 5 MG tablet Take 1 tablet (5 mg total) by mouth 2 (two) times daily before a meal. 60 tablet 11  . glucose blood (ONE TOUCH TEST STRIPS) test strip Use as instructed 100 each 12  . Insulin Pen Needle (ULTICARE MICRO PEN NEEDLES) 32G X 4 MM MISC 1 applicator by Does not apply route at bedtime. 100 each 2  . Lancets (ONETOUCH ULTRASOFT) lancets Use as instructed 100 each 12  . lisinopril (PRINIVIL,ZESTRIL) 10 MG tablet Take 0.5 tablets (5 mg total) by mouth daily. 90 tablet 3  . lovastatin (MEVACOR) 20 MG tablet Take 1 tablet (20 mg total) by mouth at bedtime. 90 tablet 3  . metFORMIN (GLUCOPHAGE) 1000 MG tablet Take 1 tablet (1,000 mg total) by mouth 2 (two) times daily with a meal. 60 tablet 2  . metoprolol tartrate (LOPRESSOR) 25 MG tablet TAKE 1 & 1/2 TABLET BY MOUTH 2 TIMES DAILY 90 tablet 6  . pantoprazole (PROTONIX) 40 MG tablet Take 1 tablet (40 mg total) by mouth daily. 45 tablet 0  . rivaroxaban (XARELTO)  20 MG TABS tablet Take 1 tablet (20 mg total) by mouth daily with supper. 30 tablet 11   Current Facility-Administered Medications on File Prior to Visit  Medication Dose Route Frequency Provider Last Rate Last Dose  . 0.9 %  sodium chloride infusion  500 mL Intravenous Continuous Ladene Artist, MD        Allergies  Allergen Reactions  . Codeine Itching and Rash    Social History   Social History  . Marital status: Divorced    Spouse name: N/A  . Number of children: 0  . Years of education: 14   Occupational History  . banquet coordinator    Social History Main Topics  . Smoking status: Former Smoker    Packs/day: 1.00    Years: 22.00    Types: Cigarettes    Quit date:  12/01/2010  . Smokeless tobacco: Never Used  . Alcohol use Yes     Comment: 06/30/2017 "glass of wine maybe q other month"  . Drug use: No  . Sexual activity: Not Currently   Other Topics Concern  . Not on file   Social History Narrative   Moved to [place] from [place] in 2002.     Fun: shop, sew, decorate   Denies any beliefs effecting healthcare    Family History  Problem Relation Age of Onset  . Adopted: Yes  . Hypertension Mother   . Hyperlipidemia Mother     Past Surgical History:  Procedure Laterality Date  . ATRIAL FIBRILLATION ABLATION  06/30/2017  . ATRIAL FIBRILLATION ABLATION N/A 06/30/2017   Procedure: Atrial Fibrillation Ablation;  Surgeon: Thompson Grayer, MD;  Location: [place] [facility] [facility];  Service: Cardiovascular;  Laterality: N/A;  . BIOPSY THYROID  1996  . CARDIOVERSION N/A 12/07/2013   Procedure: CARDIOVERSION;  Surgeon: Casandra Doffing, MD;  Location: [facility] ENDOSCOPY;  Service: Cardiovascular;  Laterality: N/A;  . CARDIOVERSION N/A 03/10/2016   Procedure: CARDIOVERSION;  Surgeon: Thayer Headings, MD;  Location: [place];  Service: Cardiovascular;  Laterality: N/A;  . EYE MUSCLE SURGERY Right ~ 1966  . TEE WITHOUT CARDIOVERSION N/A 06/29/2017   Procedure: TRANSESOPHAGEAL ECHOCARDIOGRAM (TEE);  Surgeon: Fay Records, MD;  Location: [place];  Service: Cardiovascular;  Laterality: N/A;  . TONSILLECTOMY  1971    ROS: Review of Systems Negative except as stated above PHYSICAL EXAM: BP 123/84   Pulse 69   Temp 98.5 F (36.9 C) (Oral)   Resp 16   Wt 251 lb 9.6 oz (114.1 kg)   SpO2 96%   BMI 41.23 kg/m   Wt Readings from Last 3 Encounters:  07/13/17 251 lb 9.6 oz (114.1 kg)  07/01/17 249 lb 8 oz (113.2 kg)  06/29/17 253 lb (114.8 kg)    Physical Exam General appearance - alert, well appearing, and in no distress Mental status - alert, oriented to person, place, and time, normal mood, behavior, speech, dress, motor activity, and thought  processes Neck - supple, no significant adenopathy Chest - clear to auscultation, no wheezes, rales or rhonchi, symmetric air entry Heart -RRR. Pulse regular Extremities - no lower extremity edema   Results for orders placed or performed in visit on 07/13/17  POCT glucose (manual entry)  Result Value Ref Range   POC Glucose 165 (A) 70 - 99 mg/dl   Lab Results  Component Value Date   HGBA1C 8.2 05/11/2017    ASSESSMENT AND PLAN: 1. Controlled type 2 diabetes mellitus with complication, with long-term current use of  insulin (HCC) -Blood sugars by her report are doing better after self titration of Lantus. -Continue Lantus 53 units once a day, metformin, glipizide, and Jardiance - POCT glucose (manual entry) - Insulin Glargine (LANTUS SOLOSTAR) 100 UNIT/ML Solostar Pen; Inject 53 Units into the skin daily at 10 pm.  Dispense: 3 mL; Refill: 6 - Ambulatory referral to Ophthalmology  2. Essential hypertension At goal. Continue lisinopril and Lopressor  3. Atrial fibrillation  Sounds to be in sinus rhythm post radiofrequency ablation. Plan is to continue Xarelto given CHA2DS2VasC score Keep follow-up appointment with cardiologist later this month  4. Class 3 severe obesity due to excess calories with serious comorbidity and body mass index (BMI) of 40.0 to 44.9 in adult [facility]) -pt plans to start walking once given okay by cardiology later this mth. Dietary counseling.  Futures trader given.   Patient was given the opportunity to ask questions.  Patient verbalized understanding of the plan and was able to repeat key elements of the plan.   Orders Placed This Encounter  Procedures  . Ambulatory referral to Ophthalmology  . POCT glucose (manual entry)     Requested Prescriptions   Signed Prescriptions Disp Refills  . Insulin Glargine (LANTUS SOLOSTAR) 100 UNIT/ML Solostar Pen 3 mL 6    Sig: Inject 53 Units into the skin daily at 10 pm.    Return in about 2 months (around  09/12/2017).  Karle Plumber, MD, FACP

## 2017-07-15 ENCOUNTER — Encounter: Payer: Self-pay | Admitting: Internal Medicine

## 2017-07-23 MED FILL — metFORMIN HCL 1000 MG TABS: 1000 | 30 days supply | Qty: 60 | Fill #1

## 2017-07-23 MED FILL — LOVASTATIN 20 MG TABLET: 20 | 30 days supply | Qty: 30 | Fill #5

## 2017-07-23 MED FILL — XARELTO 20 MG TABLET: 20 | 30 days supply | Qty: 30 | Fill #2

## 2017-07-23 MED FILL — METOPROLOL TARTRATE 25 MG T: 25 | 30 days supply | Qty: 90 | Fill #5

## 2017-07-27 ENCOUNTER — Encounter (HOSPITAL_COMMUNITY): Payer: Self-pay | Admitting: Nurse Practitioner

## 2017-07-27 ENCOUNTER — Ambulatory Visit (HOSPITAL_COMMUNITY)
Admission: RE | Admit: 2017-07-27 | Discharge: 2017-07-27 | Disposition: A | Discharge status: Home / Self Care | Payer: BLUE CROSS/BLUE SHIELD | Source: Ambulatory Visit | Attending: Nurse Practitioner | Admitting: Nurse Practitioner

## 2017-07-27 VITALS — BP 126/76 | HR 66 | Ht 65.5 in | Wt 251.6 lb

## 2017-07-27 DIAGNOSIS — Z7901 Long term (current) use of anticoagulants: Secondary | ICD-10-CM | POA: Insufficient documentation

## 2017-07-27 DIAGNOSIS — I4891 Unspecified atrial fibrillation: Secondary | ICD-10-CM | POA: Diagnosis not present

## 2017-07-27 DIAGNOSIS — I481 Persistent atrial fibrillation: Secondary | ICD-10-CM

## 2017-07-27 DIAGNOSIS — Z885 Allergy status to narcotic agent status: Secondary | ICD-10-CM | POA: Diagnosis not present

## 2017-07-27 DIAGNOSIS — E7800 Pure hypercholesterolemia, unspecified: Secondary | ICD-10-CM | POA: Diagnosis not present

## 2017-07-27 DIAGNOSIS — Z8673 Personal history of transient ischemic attack (TIA), and cerebral infarction without residual deficits: Secondary | ICD-10-CM | POA: Diagnosis not present

## 2017-07-27 DIAGNOSIS — Z8249 Family history of ischemic heart disease and other diseases of the circulatory system: Secondary | ICD-10-CM | POA: Diagnosis not present

## 2017-07-27 DIAGNOSIS — Z794 Long term (current) use of insulin: Secondary | ICD-10-CM | POA: Insufficient documentation

## 2017-07-27 DIAGNOSIS — Z6841 Body Mass Index (BMI) 40.0 and over, adult: Secondary | ICD-10-CM | POA: Insufficient documentation

## 2017-07-27 DIAGNOSIS — E669 Obesity, unspecified: Secondary | ICD-10-CM | POA: Insufficient documentation

## 2017-07-27 DIAGNOSIS — I10 Essential (primary) hypertension: Secondary | ICD-10-CM | POA: Diagnosis not present

## 2017-07-27 DIAGNOSIS — Z79899 Other long term (current) drug therapy: Secondary | ICD-10-CM | POA: Diagnosis not present

## 2017-07-27 DIAGNOSIS — Z87891 Personal history of nicotine dependence: Secondary | ICD-10-CM | POA: Insufficient documentation

## 2017-07-27 DIAGNOSIS — E119 Type 2 diabetes mellitus without complications: Secondary | ICD-10-CM | POA: Diagnosis not present

## 2017-07-27 DIAGNOSIS — I4819 Other persistent atrial fibrillation: Secondary | ICD-10-CM

## 2017-07-27 NOTE — Progress Notes (Signed)
Primary Care Physician: Ladell Pier, MD Referring Physician: Dr. Holley Dexter Grzesiak is a 56 y.o. female with a h/o afib since 2014 in the afib clinic for evaluation. She was admitted with HF when first diagnosed with with EF at 40-45% thought to be [place]. She had unsuccessful cardioversion . She was then placed on flecainide 01/2014 and did well until fall 2016 when she stopped meds because she was doing well. She had returned to afib and had a CVA. EF has dropped to 25%. She got back on flecainide and in rhythm, EF improved to 45%. She has mostly stayed in rhythm until the last few months. She is in the afib clinic to discuss options to restore SR.Marland Kitchen  She does not smoke, drink alcohol. No snoring /apnea per pt. She has struggled with her weight and her DM management but with recent med changes for DM she is hopeful this will help improve both. She feels that she has gained 20 lbs in the last year.   F/u in afib clinic s/p ablation. She is feeling great. She has not noted any afib since the procedure. She has not noted any afib or groin issues.   Today, she denies symptoms of palpitations, chest pain, shortness of breath, orthopnea, PND, lower extremity edema, dizziness, presyncope, syncope, or neurologic sequela. The patient is tolerating medications without difficulties and is otherwise without complaint today.   Past Medical History:  Diagnosis Date  . Arthritis    "all over" (06/30/2017)  . Chicken pox   . Dyslipidemia   . H/O: hypothyroidism    a. thyroid biopsy 1996 with synthroid. Stopped taking rx and was loss to follow up b. normal thyroid function 10/20/13  . High cholesterol   . Hx of cardiovascular stress test    ETT/Lexiscan Myoview (12/2013):  No ischemia; not gated; low risk.  Marland Kitchen Hypertension   . Obesity   . Persistent atrial fibrillation ([place])    a diagnosed 10/25/2013  . Seasonal allergies   . Stroke [facility]) 2016   "some speech problems since" (06/30/2017)  .  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 2/2 Afib with RVR for unknown duration (EF: 40-45%)  . Thyroid disease   . Type II diabetes mellitus ([place])    a. hg A1c 10, newly diagnosed (10/26/13)   Past Surgical History:  Procedure Laterality Date  . ATRIAL FIBRILLATION ABLATION  06/30/2017  . ATRIAL FIBRILLATION ABLATION N/A 06/30/2017   Procedure: Atrial Fibrillation Ablation;  Surgeon: Thompson Grayer, MD;  Location: [place] CV LAB;  Service: Cardiovascular;  Laterality: N/A;  . BIOPSY THYROID  1996  . CARDIOVERSION N/A 12/07/2013   Procedure: CARDIOVERSION;  Surgeon: Casandra Doffing, MD;  Location: [facility] ENDOSCOPY;  Service: Cardiovascular;  Laterality: N/A;  . CARDIOVERSION N/A 03/10/2016   Procedure: CARDIOVERSION;  Surgeon: Thayer Headings, MD;  Location: [place];  Service: Cardiovascular;  Laterality: N/A;  . EYE MUSCLE SURGERY Right ~ 1966  . TEE WITHOUT CARDIOVERSION N/A 06/29/2017   Procedure: TRANSESOPHAGEAL ECHOCARDIOGRAM (TEE);  Surgeon: Fay Records, MD;  Location: [facility] ENDOSCOPY;  Service: Cardiovascular;  Laterality: N/A;  . TONSILLECTOMY  1971    Current Outpatient Prescriptions  Medication Sig Dispense Refill  . Blood Glucose Monitoring Suppl (ONE TOUCH ULTRA 2) w/Device KIT 1 application by Does not apply route 2 (two) times daily. 1 each 0  . diclofenac sodium (VOLTAREN) 1 % GEL Apply 2 g topically 4 (four) times daily. (Patient taking differently: Apply 2  g topically daily as needed (pain). ) 100 g 0  . empagliflozin (JARDIANCE) 10 MG TABS tablet Take 10 mg by mouth daily. 30 tablet 5  . glipiZIDE (GLUCOTROL) 5 MG tablet Take 1 tablet (5 mg total) by mouth 2 (two) times daily before a meal. 60 tablet 11  . glucose blood (ONE TOUCH TEST STRIPS) test strip Use as instructed 100 each 12  . Insulin Glargine (LANTUS SOLOSTAR) 100 UNIT/ML Solostar Pen Inject 53 Units into the skin daily at 10 pm. 3 mL 6  . Insulin Pen Needle (ULTICARE MICRO PEN NEEDLES) 32G X 4 MM MISC 1  applicator by Does not apply route at bedtime. 100 each 2  . Lancets (ONETOUCH ULTRASOFT) lancets Use as instructed 100 each 12  . lisinopril (PRINIVIL,ZESTRIL) 10 MG tablet Take 0.5 tablets (5 mg total) by mouth daily. 90 tablet 3  . lovastatin (MEVACOR) 20 MG tablet Take 1 tablet (20 mg total) by mouth at bedtime. 90 tablet 3  . metFORMIN (GLUCOPHAGE) 1000 MG tablet Take 1 tablet (1,000 mg total) by mouth 2 (two) times daily with a meal. 60 tablet 2  . metoprolol tartrate (LOPRESSOR) 25 MG tablet TAKE 1 & 1/2 TABLET BY MOUTH 2 TIMES DAILY 90 tablet 6  . pantoprazole (PROTONIX) 40 MG tablet Take 1 tablet (40 mg total) by mouth daily. 45 tablet 0  . rivaroxaban (XARELTO) 20 MG TABS tablet Take 1 tablet (20 mg total) by mouth daily with supper. 30 tablet 11   Current Facility-Administered Medications  Medication Dose Route Frequency Provider Last Rate Last Dose  . 0.9 %  sodium chloride infusion  500 mL Intravenous Continuous Ladene Artist, MD        Allergies  Allergen Reactions  . Codeine Itching and Rash    Social History   Social History  . Marital status: Divorced    Spouse name: N/A  . Number of children: 0  . Years of education: 14   Occupational History  . banquet coordinator    Social History Main Topics  . Smoking status: Former Smoker    Packs/day: 1.00    Years: 22.00    Types: Cigarettes    Quit date: 12/01/2010  . Smokeless tobacco: Never Used  . Alcohol use Yes     Comment: 06/30/2017 "glass of wine maybe q other month"  . Drug use: No  . Sexual activity: Not Currently   Other Topics Concern  . Not on file   Social History Narrative   Moved to [place] from [place] in 2002.     Fun: shop, sew, decorate   Denies any beliefs effecting healthcare    Family History  Problem Relation Age of Onset  . Adopted: Yes  . Hypertension Mother   . Hyperlipidemia Mother     ROS- All systems are reviewed and negative except as per the HPI above  Physical  Exam: Vitals:   07/27/17 1024  BP: 126/76  Pulse: 66  Weight: 251 lb 9.6 oz (114.1 kg)  Height: 5' 5.5" (1.664 m)   Wt Readings from Last 3 Encounters:  07/27/17 251 lb 9.6 oz (114.1 kg)  07/13/17 251 lb 9.6 oz (114.1 kg)  07/01/17 249 lb 8 oz (113.2 kg)    Labs: Lab Results  Component Value Date   NA 134 (L) 06/30/2017   K 4.1 06/30/2017   CL 106 06/30/2017   CO2 20 (L) 06/30/2017   GLUCOSE 133 (H) 06/30/2017   BUN 14 06/30/2017  CREATININE 0.72 06/30/2017   CALCIUM 8.5 (L) 06/30/2017   MG 1.8 10/25/2013   Lab Results  Component Value Date   INR 1.53 (H) 09/16/2015   Lab Results  Component Value Date   CHOL 156 11/19/2016   HDL 30 (L) 11/19/2016   LDLCALC 67 11/19/2016   TRIG 296 (H) 11/19/2016     GEN- The patient is well appearing, alert and oriented x 3 today.   Head- normocephalic, atraumatic Eyes-  Sclera clear, conjunctiva pink Ears- hearing intact Oropharynx- clear Neck- supple, no JVP Lymph- no cervical lymphadenopathy Lungs- Clear to ausculation bilaterally, normal work of breathing Heart- irregular rate and rhythm, no murmurs, rubs or gallops, PMI not laterally displaced GI- soft, NT, ND, + BS Extremities- no clubbing, cyanosis, or edema MS- no significant deformity or atrophy Skin- no rash or lesion Psych- euthymic mood, full affect Neuro- strength and sensation are intact  EKG- afib at 76 bpm, LAD, qrs int 108 ms, qtc 468 ms Epic records reviewed Echo-Study Conclusions  - Left ventricle: The cavity size was normal. Wall thickness was   normal. Indeterminant diastolic function (atrial fibrillation).   Systolic function was mildly reduced. The estimated ejection   fraction was in the range of 45% to 50%. Diffuse hypokinesis. - Aortic valve: There was no stenosis. - Mitral valve: Mildly calcified annulus. There was no significant   regurgitation. - Left atrium: The atrium was moderately dilated. 38 mm - Right ventricle: The cavity size  was normal. Systolic function   was normal. - Pulmonary arteries: No complete TR doppler jet so unable to   estimate PA systolic pressure. - Inferior vena cava: The vessel was normal in size. The   respirophasic diameter changes were in the normal range (>= 50%),   consistent with normal central venous pressure.  Impressions:  - The patient was in atrial fibrillation. Normal LV size with EF   45-50%, diffuse hypokinesis. Normal RV size and systolic   function. No significant valvular abnormalities.   Assessment and Plan: 1. Persisitent afib Maintaining  SR since ablation 7/31 Off flecainide as it was not keeping her in rhythm   Continue metoprolol at current dose  Continue xarelto 20 mg a day for a chadsvasc score of at least 5, reminded not to miss doses Weight loss and regular exercise encouraged  2. HTN  Stable  F/u with Dr. Rayann Heman as scheduled 10/29  Geroge Baseman. Lehua Flores, [facility] [address] [place], [place] [address] [phone]

## 2017-08-05 ENCOUNTER — Other Ambulatory Visit: Payer: Self-pay

## 2017-08-05 DIAGNOSIS — M79602 Pain in left arm: Principal | ICD-10-CM

## 2017-08-05 DIAGNOSIS — M79605 Pain in left leg: Secondary | ICD-10-CM

## 2017-08-05 DIAGNOSIS — M79601 Pain in right arm: Secondary | ICD-10-CM

## 2017-08-05 DIAGNOSIS — M79604 Pain in right leg: Secondary | ICD-10-CM

## 2017-08-05 MED ORDER — DICLOFENAC SODIUM 1 % TD GEL: 2.0000 g | Freq: Four times a day (QID) | TRANSDERMAL | 0 refills | Status: DC

## 2017-08-05 MED FILL — VOLTAREN 1% GEL: 1 | 12 days supply | Qty: 100 | Fill #0

## 2017-08-05 MED FILL — glipiZIDE 5 MG TABS: 5 | 30 days supply | Qty: 60 | Fill #2

## 2017-08-05 MED FILL — LANTUS SOLOSTAR 100 UNITS/M: 100 | 28 days supply | Qty: 15 | Fill #0

## 2017-08-05 MED FILL — JARDIANCE 10 MG TABLET: 10 | 30 days supply | Qty: 30 | Fill #3

## 2017-08-25 MED FILL — LOVASTATIN 20 MG TABLET: 20 | 30 days supply | Qty: 30 | Fill #6

## 2017-08-25 MED FILL — XARELTO 20 MG TABLET: 20 | 30 days supply | Qty: 30 | Fill #3

## 2017-08-25 MED FILL — metFORMIN HCL 1000 MG TABS: 1000 | 30 days supply | Qty: 60 | Fill #2

## 2017-08-25 MED FILL — METOPROLOL TARTRATE 25 MG T: 25 | 30 days supply | Qty: 90 | Fill #6

## 2017-08-28 ENCOUNTER — Other Ambulatory Visit: Payer: Self-pay | Admitting: Internal Medicine

## 2017-08-28 DIAGNOSIS — E118 Type 2 diabetes mellitus with unspecified complications: Secondary | ICD-10-CM

## 2017-08-28 DIAGNOSIS — Z794 Long term (current) use of insulin: Secondary | ICD-10-CM

## 2017-08-28 MED FILL — LANTUS SOLOSTAR 100 UNITS/M: 100 | 11 days supply | Qty: 6 | Fill #1

## 2017-08-31 ENCOUNTER — Telehealth: Payer: Self-pay | Admitting: Internal Medicine

## 2017-08-31 DIAGNOSIS — E118 Type 2 diabetes mellitus with unspecified complications: Secondary | ICD-10-CM

## 2017-08-31 DIAGNOSIS — Z794 Long term (current) use of insulin: Secondary | ICD-10-CM

## 2017-08-31 MED ORDER — INSULIN GLARGINE 100 UNIT/ML SOLOSTAR PEN: 53.0000 [IU] | PEN_INJECTOR | Freq: Every day | SUBCUTANEOUS | 2 refills | Status: DC

## 2017-08-31 NOTE — Telephone Encounter (Signed)
Refilled

## 2017-08-31 NOTE — Telephone Encounter (Signed)
Pt needs a refill on lantus solostar please up

## 2017-09-03 ENCOUNTER — Encounter (HOSPITAL_COMMUNITY): Payer: Self-pay | Admitting: Emergency Medicine

## 2017-09-03 ENCOUNTER — Ambulatory Visit (HOSPITAL_COMMUNITY)
Admission: EM | Admit: 2017-09-03 | Discharge: 2017-09-03 | Disposition: A | Discharge status: Home / Self Care | Payer: BLUE CROSS/BLUE SHIELD | Attending: Family Medicine | Admitting: Family Medicine

## 2017-09-03 ENCOUNTER — Ambulatory Visit (INDEPENDENT_AMBULATORY_CARE_PROVIDER_SITE_OTHER): Payer: BLUE CROSS/BLUE SHIELD

## 2017-09-03 DIAGNOSIS — L089 Local infection of the skin and subcutaneous tissue, unspecified: Secondary | ICD-10-CM | POA: Diagnosis not present

## 2017-09-03 DIAGNOSIS — S90851A Superficial foreign body, right foot, initial encounter: Secondary | ICD-10-CM

## 2017-09-03 DIAGNOSIS — M19071 Primary osteoarthritis, right ankle and foot: Secondary | ICD-10-CM | POA: Diagnosis not present

## 2017-09-03 MED ORDER — CIPROFLOXACIN HCL 500 MG PO TABS: 500.0000 mg | ORAL_TABLET | Freq: Two times a day (BID) | ORAL | 0 refills | Status: DC

## 2017-09-03 MED ORDER — LIDOCAINE-EPINEPHRINE (PF) 2 %-1:200000 IJ SOLN
INTRAMUSCULAR | Status: AC
  Filled 2017-09-03: qty 20

## 2017-09-03 NOTE — ED Provider Notes (Signed)
[place]    CSN: [id number] Arrival date & time: 09/03/17  1354     History   Chief Complaint Chief Complaint  Patient presents with  . Foreign Body in Skin    right foot    HPI Kelly Lang is a 56 y.o. female.   The history is provided by the patient.  : 45 y/ofemale presented to clinic with CC of pain to sole of right foot. Pt reports stepped on dog bone and bone got stuck in the foot which her mother managed to pull out. Pt reports piece of bone is still in the foot. Painful to walk. Small blood blister noted to sole of foot lateral aspect. Extreme tender to palpation. No erythema noted.   Past Medical History:  Diagnosis Date  . Arthritis    "all over" (06/30/2017)  . Chicken pox   . Dyslipidemia   . H/O: hypothyroidism    a. thyroid biopsy 1996 with synthroid. Stopped taking rx and was loss to follow up b. normal thyroid function 10/20/13  . High cholesterol   . Hx of cardiovascular stress test    ETT/Lexiscan Myoview (12/2013):  No ischemia; not gated; low risk.  Marland Kitchen Hypertension   . Obesity   . Persistent atrial fibrillation ([place])    a diagnosed 10/25/2013  . Seasonal allergies   . Stroke [facility]) 2016   "some speech problems since" (06/30/2017)  . 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 2/2 Afib with RVR for unknown duration (EF: 40-45%)  . Thyroid disease   . Type II diabetes mellitus ([place])    a. hg A1c 10, newly diagnosed (10/26/13)    Patient Active Problem List   Diagnosis Date Noted  . Chronic systolic CHF (congestive heart failure) ([place]) 09/16/2015  . Cerebrovascular accident (CVA) due to embolism of left middle cerebral artery ([place])   . Thyroid mass 09/15/2015  . Low back pain 04/03/2014  . Unspecified vitamin D deficiency 01/20/2014  . Essential hypertension, benign 01/20/2014  . Chronic anticoagulation 10/31/2013  . Tachycardia induced cardiomyopathy ([place])   . H/O: hypothyroidism   . Obesity   . IDDM (insulin dependent  diabetes mellitus) ([place])   . Arthritis   . Dyslipidemia   . Atrial fibrillation  10/25/2013    Past Surgical History:  Procedure Laterality Date  . ATRIAL FIBRILLATION ABLATION  06/30/2017  . ATRIAL FIBRILLATION ABLATION N/A 06/30/2017   Procedure: Atrial Fibrillation Ablation;  Surgeon: Thompson Grayer, MD;  Location: [place] CV LAB;  Service: Cardiovascular;  Laterality: N/A;  . BIOPSY THYROID  1996  . CARDIOVERSION N/A 12/07/2013   Procedure: CARDIOVERSION;  Surgeon: Casandra Doffing, MD;  Location: [facility] ENDOSCOPY;  Service: Cardiovascular;  Laterality: N/A;  . CARDIOVERSION N/A 03/10/2016   Procedure: CARDIOVERSION;  Surgeon: Thayer Headings, MD;  Location: [place];  Service: Cardiovascular;  Laterality: N/A;  . EYE MUSCLE SURGERY Right ~ 1966  . TEE WITHOUT CARDIOVERSION N/A 06/29/2017   Procedure: TRANSESOPHAGEAL ECHOCARDIOGRAM (TEE);  Surgeon: Fay Records, MD;  Location: [place];  Service: Cardiovascular;  Laterality: N/A;  . TONSILLECTOMY  1971    OB History    No data available       Home Medications    Prior to Admission medications   Medication Sig Start Date End Date Taking? Authorizing Provider  Blood Glucose Monitoring Suppl (ONE TOUCH ULTRA 2) w/Device KIT 1 application by Does not apply route 2 (two) times daily. 11/19/16  Yes Maren Reamer, MD  diclofenac sodium (VOLTAREN) 1 % GEL Apply 2 g topically 4 (four) times daily. 08/05/17  Yes Ladell Pier, MD  empagliflozin (JARDIANCE) 10 MG TABS tablet Take 10 mg by mouth daily. 05/12/17  Yes Ladell Pier, MD  glipiZIDE (GLUCOTROL) 5 MG tablet Take 1 tablet (5 mg total) by mouth 2 (two) times daily before a meal. 05/12/17  Yes Ladell Pier, MD  glucose blood (ONE TOUCH TEST STRIPS) test strip Use as instructed 11/19/16  Yes Langeland, Dawn T, MD  Insulin Glargine (LANTUS SOLOSTAR) 100 UNIT/ML Solostar Pen Inject 53 Units into the skin daily at 10 pm. 08/31/17  Yes Ladell Pier, MD  Insulin  Pen Needle (ULTICARE MICRO PEN NEEDLES) 32G X 4 MM MISC 1 applicator by Does not apply route at bedtime. 11/19/16  Yes Langeland, Dawn T, MD  Lancets [facility] ULTRASOFT) lancets Use as instructed 11/19/16  Yes Langeland, Dawn T, MD  lisinopril (PRINIVIL,ZESTRIL) 10 MG tablet Take 0.5 tablets (5 mg total) by mouth daily. 11/19/16  Yes Langeland, Dawn T, MD  lovastatin (MEVACOR) 20 MG tablet Take 1 tablet (20 mg total) by mouth at bedtime. 11/19/16  Yes Maren Reamer, MD  metFORMIN (GLUCOPHAGE) 1000 MG tablet Take 1 tablet (1,000 mg total) by mouth 2 (two) times daily with a meal. 04/22/17  Yes Jegede, Olugbemiga E, MD  metoprolol tartrate (LOPRESSOR) 25 MG tablet TAKE 1 & 1/2 TABLET BY MOUTH 2 TIMES DAILY 02/20/17  Yes Nahser, Wonda Cheng, MD  rivaroxaban (XARELTO) 20 MG TABS tablet Take 1 tablet (20 mg total) by mouth daily with supper. 09/08/16  Yes Nahser, Wonda Cheng, MD  ciprofloxacin (CIPRO) 500 MG tablet Take 1 tablet (500 mg total) by mouth 2 (two) times daily. 09/03/17   , , NP  pantoprazole (PROTONIX) 40 MG tablet Take 1 tablet (40 mg total) by mouth daily. 07/01/17 08/15/17  Baldwin Jamaica, PA-C    Family History Family History  Problem Relation Age of Onset  . Adopted: Yes  . Hypertension Mother   . Hyperlipidemia Mother     Social History Social History  Substance Use Topics  . Smoking status: Former Smoker    Packs/day: 1.00    Years: 22.00    Types: Cigarettes    Quit date: 12/01/2010  . Smokeless tobacco: Never Used  . Alcohol use Yes     Comment: 06/30/2017 "glass of wine maybe q other month"     Allergies   Codeine   Review of Systems Review of Systems  Constitutional: Negative.   HENT: Negative.   Respiratory: Negative.   Cardiovascular: Negative.   Musculoskeletal: Positive for gait problem (Pain to sole of right foot. Pain worse with walking and weight bearing ).  Skin: Positive for wound (Small, rasied, tender, fluid filled blister noted to  foot). Negative for color change.     Physical Exam Triage Vital Signs ED Triage Vitals  Enc Vitals Group     BP 09/03/17 1450 99/64     Pulse Rate 09/03/17 1450 83     Resp --      Temp 09/03/17 1450 98.5 F (36.9 C)     Temp Source 09/03/17 1450 Oral     SpO2 09/03/17 1450 99 %     Weight --      Height --      Head Circumference --      Peak Flow --      Pain Score 09/03/17 1448 4     Pain  Loc --      Pain Edu? --      Excl. in [place]? --    No data found.   Updated Vital Signs BP 99/64 (BP Location: Left Arm)   Pulse 83   Temp 98.5 F (36.9 C) (Oral)   SpO2 99%   Visual Acuity Right Eye Distance:   Left Eye Distance:   Bilateral Distance:    Right Eye Near:   Left Eye Near:    Bilateral Near:     Physical Exam  Constitutional: She is oriented to person, place, and time. She appears well-developed and well-nourished. No distress.  HENT:  Head: Normocephalic.  Eyes: Pupils are equal, round, and reactive to light. EOM are normal.  Cardiovascular: Normal rate, regular rhythm and normal heart sounds.   Pulmonary/Chest: Effort normal and breath sounds normal.  Musculoskeletal: She exhibits tenderness (Tendrness to sole of right foot. tender to apalpation. Pain worse with walking and weight beraring, Small fluid filled blister noted, tender to palpation . No surrounding erythema appreciated).  Neurological: She is alert and oriented to person, place, and time.  Skin: She is not diaphoretic.     UC Treatments / Results  Labs (all labs ordered are listed, but only abnormal results are displayed) Labs Reviewed - No data to display  EKG  EKG Interpretation None       Radiology Dg Foot Complete Right  Result Date: 09/03/2017 CLINICAL DATA:  Stepped on [place] bone several days ago with persistent pain, initial encounter EXAM: RIGHT FOOT COMPLETE - 3+ VIEW COMPARISON:  None. FINDINGS: No acute fracture or dislocation is noted. A triangular shaped radiopaque  foreign body is noted within the lateral soft tissues adjacent to the fifth metatarsal consistent with the clinical history. Tarsal degenerative changes are seen. Calcaneal spurring is noted. IMPRESSION: Degenerative changes. Triangular-shaped radiopaque foreign body within the lateral soft tissues of the foot consistent with the given clinical history. Electronically Signed   By: Inez Catalina M.D.   On: 09/03/2017 15:38    Procedures .Foreign Body Removal Date/Time: 09/03/2017 4:10 PM Performed by: Teola Bradley Authorized by: Teola Bradley  Consent: Verbal consent obtained. Risks and benefits: risks, benefits and alternatives were discussed Consent given by: patient Patient understanding: patient states understanding of the procedure being performed Patient consent: the patient's understanding of the procedure matches consent given Procedure consent: procedure consent matches procedure scheduled Body area: skin General location: lower extremity Location details: right foot  Anesthesia: Local Anesthetic: lidocaine 2% with epinephrine Anesthetic total: 1 mL  Sedation: Patient sedated: no Comments: FB to sole of left foot. Confirmed by XR:  Triangular-shaped radiopaque foreign body within the lateral soft tissues of the foot consistent with the given clinical history. Used 11 blade for superficial incision, minimal bleeding. Small about 0.25 ml's of pus retrieved. FB Successfully removed. Pt tolerated procedure well. No bleeding post procedure. Non stick gauze with ABD pad and covered with Kurlix. Pt UTD with Td.    (including critical care time)  Medications Ordered in UC Medications - No data to display   Initial Impression / Assessment and Plan / UC Course  I have reviewed the triage vital signs and the nursing notes.  Pertinent labs & imaging results that were available during my care of the patient were reviewed by me and considered in my medical decision making  (see chart for details).    After wound care instructions provided.  Final Clinical Impressions(s) / UC Diagnoses   Final diagnoses:  Superficial foreign body of foot with infection, right, initial encounter    New Prescriptions New Prescriptions   CIPROFLOXACIN (CIPRO) 500 MG TABLET    Take 1 tablet (500 mg total) by mouth 2 (two) times daily.     Controlled Substance Prescriptions [place] Controlled Substance Registry consulted? Not Applicable   Teola Bradley, NP 09/03/17 1624

## 2017-09-03 NOTE — Discharge Instructions (Signed)
Keep wound site clean and dry. Keep dressing on x 2 days.  Wash with soap and water after 48 hrs and dry the wound site. Use betadine twice daily to clean until scabbed.No neosporin or ointment use of ant king. Avoid weight bearing, walking and prolong standing. Keep close glucose monitoring, cipro may lower blood sugar. If redness, swelling, drainage noted return to clinic for re evaluation

## 2017-09-03 NOTE — ED Triage Notes (Signed)
Pt states she stepped on a piece of [place] bone that was on the floor from her dog chewing on it.  She thought she got it all out of her foot, but she is having severe pain at the site.  This occurred 10 days ago.

## 2017-09-07 ENCOUNTER — Ambulatory Visit: Payer: BLUE CROSS/BLUE SHIELD | Attending: Internal Medicine | Admitting: Internal Medicine

## 2017-09-07 ENCOUNTER — Encounter: Payer: Self-pay | Admitting: Internal Medicine

## 2017-09-07 VITALS — BP 106/65 | HR 72 | Temp 98.7°F | Resp 16 | Wt 247.6 lb

## 2017-09-07 DIAGNOSIS — Z23 Encounter for immunization: Secondary | ICD-10-CM | POA: Diagnosis not present

## 2017-09-07 DIAGNOSIS — I10 Essential (primary) hypertension: Secondary | ICD-10-CM

## 2017-09-07 DIAGNOSIS — Z79899 Other long term (current) drug therapy: Secondary | ICD-10-CM | POA: Diagnosis not present

## 2017-09-07 DIAGNOSIS — Z794 Long term (current) use of insulin: Secondary | ICD-10-CM | POA: Insufficient documentation

## 2017-09-07 DIAGNOSIS — I4892 Unspecified atrial flutter: Secondary | ICD-10-CM | POA: Diagnosis not present

## 2017-09-07 DIAGNOSIS — E119 Type 2 diabetes mellitus without complications: Secondary | ICD-10-CM

## 2017-09-07 DIAGNOSIS — Z9889 Other specified postprocedural states: Secondary | ICD-10-CM | POA: Insufficient documentation

## 2017-09-07 DIAGNOSIS — Z87891 Personal history of nicotine dependence: Secondary | ICD-10-CM | POA: Insufficient documentation

## 2017-09-07 DIAGNOSIS — E118 Type 2 diabetes mellitus with unspecified complications: Secondary | ICD-10-CM | POA: Diagnosis not present

## 2017-09-07 DIAGNOSIS — Z6841 Body Mass Index (BMI) 40.0 and over, adult: Secondary | ICD-10-CM | POA: Insufficient documentation

## 2017-09-07 DIAGNOSIS — R1013 Epigastric pain: Secondary | ICD-10-CM | POA: Insufficient documentation

## 2017-09-07 DIAGNOSIS — E039 Hypothyroidism, unspecified: Secondary | ICD-10-CM | POA: Insufficient documentation

## 2017-09-07 DIAGNOSIS — Z7901 Long term (current) use of anticoagulants: Secondary | ICD-10-CM | POA: Insufficient documentation

## 2017-09-07 DIAGNOSIS — Z8673 Personal history of transient ischemic attack (TIA), and cerebral infarction without residual deficits: Secondary | ICD-10-CM | POA: Insufficient documentation

## 2017-09-07 DIAGNOSIS — I5022 Chronic systolic (congestive) heart failure: Secondary | ICD-10-CM | POA: Diagnosis not present

## 2017-09-07 DIAGNOSIS — L309 Dermatitis, unspecified: Secondary | ICD-10-CM | POA: Insufficient documentation

## 2017-09-07 DIAGNOSIS — E669 Obesity, unspecified: Secondary | ICD-10-CM | POA: Insufficient documentation

## 2017-09-07 DIAGNOSIS — I110 Hypertensive heart disease with heart failure: Secondary | ICD-10-CM | POA: Insufficient documentation

## 2017-09-07 DIAGNOSIS — E785 Hyperlipidemia, unspecified: Secondary | ICD-10-CM | POA: Diagnosis not present

## 2017-09-07 LAB — GLUCOSE, POCT (MANUAL RESULT ENTRY): POC GLUCOSE: 178 mg/dL — AB (ref 70–99)

## 2017-09-07 LAB — POCT GLYCOSYLATED HEMOGLOBIN (HGB A1C): HEMOGLOBIN A1C: 7

## 2017-09-07 MED ORDER — CLOBETASOL PROPIONATE 0.05 % EX SOLN: CUTANEOUS | 1 refills | Status: DC

## 2017-09-07 MED ORDER — OMEPRAZOLE 20 MG PO CPDR: 20.0000 mg | DELAYED_RELEASE_CAPSULE | Freq: Every day | ORAL | 3 refills | Status: DC

## 2017-09-07 MED ORDER — TRIAMCINOLONE ACETONIDE 0.1 % EX CREA: 1.0000 "application " | TOPICAL_CREAM | Freq: Two times a day (BID) | CUTANEOUS | 1 refills | Status: DC

## 2017-09-07 MED FILL — TRIAMCINOLONE ACETONIDE 0.1: 0.1 | 15 days supply | Qty: 30 | Fill #0

## 2017-09-07 MED FILL — OMEPRAZOLE DR 20 MG CAPSULE: 20 | 30 days supply | Qty: 30 | Fill #0

## 2017-09-07 NOTE — Progress Notes (Signed)
Patient ID: Kelly Lang, female    DOB: 09-Feb-1961  MRN: [id number]  CC: Abdominal Pain; Rash; and Follow-up (Urgent Care)   Subjective: Kelly Lang is a 56 y.o. female who presents for chronic ds management Her concerns today include: Pt with hx of a.flutter s/p ablation 05/2017 on Xarelto, DM type 2 , obesity, HL, HTN, chronic systolic CHF with EF 37%, CVA. Last saw me 07/2017.   1. "My stomach feels like a big ballon inside it when I eat" x 2 wks -"not painful; its just uncomfortable" -no burning, no back brush -N/V occasionally. No constipation or diarrhea. No increase gassiness  -worse with eating pasta and bread. Does not eat spicy foods and does not drink juices. Occasional cup coffee. -given Protonix post ablation for 6 wks; finished about 2 wks ago  2.  DM: BS BID: before BF range 130-140, before dinner 110-120 Compliant with meds - lantus, jardiance, Metformin and Glucotrol Exercise: walking 3 x a wk for 30 mins -she plans to schedule eye appt  3. C/o chronic rash RT elbow and behind RT ear x several yrs -sometimes it cracks and bleeds. -using OTC Hydrocortisone -also has rash at back of neck. Told seborrheic dermatitis. Uses Tar base shampoo once a wk since 1997.   4. HTN: compliant with lisinopril and metoprolol  5. HL: Tolerating lovastatin  6. Seen in UC recently for foreign object in RT foot. Piece of dog bone. It was removed. On Cipro. Feeling better  Patient Active Problem List   Diagnosis Date Noted  . Chronic systolic CHF (congestive heart failure) ([place]) 09/16/2015  . Cerebrovascular accident (CVA) due to embolism of left middle cerebral artery ([place])   . Thyroid mass 09/15/2015  . Low back pain 04/03/2014  . Unspecified vitamin D deficiency 01/20/2014  . Essential hypertension, benign 01/20/2014  . Chronic anticoagulation 10/31/2013  . Tachycardia induced cardiomyopathy ([place])   . H/O: hypothyroidism   . Obesity   . IDDM (insulin dependent  diabetes mellitus) ([place])   . Arthritis   . Dyslipidemia   . Atrial fibrillation  10/25/2013     Current Outpatient Prescriptions on File Prior to Visit  Medication Sig Dispense Refill  . Blood Glucose Monitoring Suppl (ONE TOUCH ULTRA 2) w/Device KIT 1 application by Does not apply route 2 (two) times daily. 1 each 0  . ciprofloxacin (CIPRO) 500 MG tablet Take 1 tablet (500 mg total) by mouth 2 (two) times daily. 20 tablet 0  . diclofenac sodium (VOLTAREN) 1 % GEL Apply 2 g topically 4 (four) times daily. 100 g 0  . empagliflozin (JARDIANCE) 10 MG TABS tablet Take 10 mg by mouth daily. 30 tablet 5  . glipiZIDE (GLUCOTROL) 5 MG tablet Take 1 tablet (5 mg total) by mouth 2 (two) times daily before a meal. 60 tablet 11  . glucose blood (ONE TOUCH TEST STRIPS) test strip Use as instructed 100 each 12  . Insulin Glargine (LANTUS SOLOSTAR) 100 UNIT/ML Solostar Pen Inject 53 Units into the skin daily at 10 pm. 15 mL 2  . Insulin Pen Needle (ULTICARE MICRO PEN NEEDLES) 32G X 4 MM MISC 1 applicator by Does not apply route at bedtime. 100 each 2  . Lancets (ONETOUCH ULTRASOFT) lancets Use as instructed 100 each 12  . lisinopril (PRINIVIL,ZESTRIL) 10 MG tablet Take 0.5 tablets (5 mg total) by mouth daily. 90 tablet 3  . lovastatin (MEVACOR) 20 MG tablet Take 1 tablet (20 mg total) by mouth at bedtime. [place]  tablet 3  . metFORMIN (GLUCOPHAGE) 1000 MG tablet Take 1 tablet (1,000 mg total) by mouth 2 (two) times daily with a meal. 60 tablet 2  . metoprolol tartrate (LOPRESSOR) 25 MG tablet TAKE 1 & 1/2 TABLET BY MOUTH 2 TIMES DAILY 90 tablet 6  . rivaroxaban (XARELTO) 20 MG TABS tablet Take 1 tablet (20 mg total) by mouth daily with supper. 30 tablet 11   Current Facility-Administered Medications on File Prior to Visit  Medication Dose Route Frequency Provider Last Rate Last Dose  . 0.9 %  sodium chloride infusion  500 mL Intravenous Continuous Ladene Artist, MD        Allergies  Allergen Reactions    . Codeine Itching and Rash    Social History   Social History  . Marital status: Divorced    Spouse name: N/A  . Number of children: 0  . Years of education: 14   Occupational History  . banquet coordinator    Social History Main Topics  . Smoking status: Former Smoker    Packs/day: 1.00    Years: 22.00    Types: Cigarettes    Quit date: 12/01/2010  . Smokeless tobacco: Never Used  . Alcohol use Yes     Comment: 06/30/2017 "glass of wine maybe q other month"  . Drug use: No  . Sexual activity: Not Currently   Other Topics Concern  . Not on file   Social History Narrative   Moved to [place] from [place] in 2002.     Fun: shop, sew, decorate   Denies any beliefs effecting healthcare    Family History  Problem Relation Age of Onset  . Adopted: Yes  . Hypertension Mother   . Hyperlipidemia Mother     Past Surgical History:  Procedure Laterality Date  . ATRIAL FIBRILLATION ABLATION  06/30/2017  . ATRIAL FIBRILLATION ABLATION N/A 06/30/2017   Procedure: Atrial Fibrillation Ablation;  Surgeon: Thompson Grayer, MD;  Location: [place] CV LAB;  Service: Cardiovascular;  Laterality: N/A;  . BIOPSY THYROID  1996  . CARDIOVERSION N/A 12/07/2013   Procedure: CARDIOVERSION;  Surgeon: Casandra Doffing, MD;  Location: [facility] ENDOSCOPY;  Service: Cardiovascular;  Laterality: N/A;  . CARDIOVERSION N/A 03/10/2016   Procedure: CARDIOVERSION;  Surgeon: Thayer Headings, MD;  Location: [place];  Service: Cardiovascular;  Laterality: N/A;  . EYE MUSCLE SURGERY Right ~ 1966  . TEE WITHOUT CARDIOVERSION N/A 06/29/2017   Procedure: TRANSESOPHAGEAL ECHOCARDIOGRAM (TEE);  Surgeon: Fay Records, MD;  Location: [place];  Service: Cardiovascular;  Laterality: N/A;  . TONSILLECTOMY  1971    ROS: Review of Systems Negative except as stated above PHYSICAL EXAM: BP 106/65   Pulse 72   Temp 98.7 F (37.1 C) (Oral)   Resp 16   Wt 247 lb 9.6 oz (112.3 kg)   SpO2 95%   BMI 40.58 kg/m    Physical Exam General appearance - alert, well appearing, and in no distress Mental status - alert, oriented to person, place, and time, normal mood, behavior, speech, dress, motor activity, and thought processes Mouth - mucous membranes moist, pharynx normal without lesions Neck - supple, no significant adenopathy Chest - clear to auscultation, no wheezes, rales or rhonchi, symmetric air entry Heart - normal rate, regular rhythm, normal S1, S2, no murmurs, rubs, clicks or gallops Abdomen -obese, normal bowel sounds, soft and nontender no organomegaly . Extremities - no lower extremity edema Skin - white plaque like dermatitis over the right elbow and  behind the right ear.  Similar white plaques with surrounding erythema on the scalp occipital area and close to neck line   Results for orders placed or performed in visit on 09/07/17  POCT glucose (manual entry)  Result Value Ref Range   POC Glucose 178 (A) 70 - 99 mg/dl  POCT glycosylated hemoglobin (Hb A1C)  Result Value Ref Range   Hemoglobin A1C 7.0     ASSESSMENT AND PLAN: 1. Controlled type 2 diabetes mellitus with complication, without long-term current use of insulin (HCC) At goal. Continue current medications, healthy eating habits and increase physical activity - POCT glucose (manual entry) - POCT glycosylated hemoglobin (Hb A1C)  2. Essential hypertension At goal  3. Dyspepsia -Will do urea breath test and treat accordingly if positive. If negative and symptoms continue we will check gallbladder ultrasound - omeprazole (PRILOSEC) 20 MG capsule; Take 1 capsule (20 mg total) by mouth daily.  Dispense: 30 capsule; Refill: 3 - Lipase - Hepatic Function Panel  4. Dermatitis Questionable psoriasis versus eczema. - triamcinolone cream (KENALOG) 0.1 %; Apply 1 application topically 2 (two) times daily.  Dispense: 30 g; Refill: 1 - clobetasol (TEMOVATE) 0.05 % external solution; Apply small quarter size to affected area on  scalp BID x 1 week then PRN  Dispense: 50 mL; Refill: 1  5. Hyperlipidemia, unspecified hyperlipidemia type Continue lovastatin  6. Need for influenza vaccination - Flu Vaccine QUAD 6+ mos PF IM (Fluarix Quad PF)   Patient was given the opportunity to ask questions.  Patient verbalized understanding of the plan and was able to repeat key elements of the plan.   Orders Placed This Encounter  Procedures  . Flu Vaccine QUAD 6+ mos PF IM (Fluarix Quad PF)  . Lipase  . Hepatic Function Panel  . H. pylori breath test  . POCT glucose (manual entry)  . POCT glycosylated hemoglobin (Hb A1C)     Requested Prescriptions   Signed Prescriptions Disp Refills  . omeprazole (PRILOSEC) 20 MG capsule 30 capsule 3    Sig: Take 1 capsule (20 mg total) by mouth daily.  Marland Kitchen triamcinolone cream (KENALOG) 0.1 % 30 g 1    Sig: Apply 1 application topically 2 (two) times daily.  . clobetasol (TEMOVATE) 0.05 % external solution 50 mL 1    Sig: Apply small quarter size to affected area on scalp BID x 1 week then PRN    Return in about 3 months (around 12/08/2017).  Karle Plumber, MD, FACP

## 2017-09-07 NOTE — Patient Instructions (Addendum)
Start Prilosec 20 mg daily.  We will check for H.pylori bacteria today and will treat if positive.  If negative and symptoms persist, we will check the gall bladder for stones.   Start Triamcinolone cream on the elbow and behind right ear.   Influenza Virus Vaccine injection (Fluarix) What is this medicine? INFLUENZA VIRUS VACCINE (in floo EN zuh VAHY ruhs vak SEEN) helps to reduce the risk of getting influenza also known as the flu. This medicine may be used for other purposes; ask your health care provider or pharmacist if you have questions. COMMON BRAND NAME(S): Fluarix, Fluzone What should I tell my health care provider before I take this medicine? They need to know if you have any of these conditions: -bleeding disorder like hemophilia -fever or infection -Guillain-Barre syndrome or other neurological problems -immune system problems -infection with the human immunodeficiency virus (HIV) or AIDS -low blood platelet counts -multiple sclerosis -an unusual or allergic reaction to influenza virus vaccine, eggs, chicken proteins, latex, gentamicin, other medicines, foods, dyes or preservatives -pregnant or trying to get pregnant -breast-feeding How should I use this medicine? This vaccine is for injection into a muscle. It is given by a health care professional. A copy of Vaccine Information Statements will be given before each vaccination. Read this sheet carefully each time. The sheet may change frequently. Talk to your pediatrician regarding the use of this medicine in children. Special care may be needed. Overdosage: If you think you have taken too much of this medicine contact a poison control center or emergency room at once. NOTE: This medicine is only for you. Do not share this medicine with others. What if I miss a dose? This does not apply. What may interact with this medicine? -chemotherapy or radiation therapy -medicines that lower your immune system like etanercept,  anakinra, infliximab, and adalimumab -medicines that treat or prevent blood clots like warfarin -phenytoin -steroid medicines like prednisone or cortisone -theophylline -vaccines This list may not describe all possible interactions. Give your health care provider a list of all the medicines, herbs, non-prescription drugs, or dietary supplements you use. Also tell them if you smoke, drink alcohol, or use illegal drugs. Some items may interact with your medicine. What should I watch for while using this medicine? Report any side effects that do not go away within 3 days to your doctor or health care professional. Call your health care provider if any unusual symptoms occur within 6 weeks of receiving this vaccine. You may still catch the flu, but the illness is not usually as bad. You cannot get the flu from the vaccine. The vaccine will not protect against colds or other illnesses that may cause fever. The vaccine is needed every year. What side effects may I notice from receiving this medicine? Side effects that you should report to your doctor or health care professional as soon as possible: -allergic reactions like skin rash, itching or hives, swelling of the face, lips, or tongue Side effects that usually do not require medical attention (report to your doctor or health care professional if they continue or are bothersome): -fever -headache -muscle aches and pains -pain, tenderness, redness, or swelling at site where injected -weak or tired This list may not describe all possible side effects. Call your doctor for medical advice about side effects. You may report side effects to FDA at 1-800-FDA-1088. Where should I keep my medicine? This vaccine is only given in a clinic, pharmacy, doctor's office, or other health care setting and  will not be stored at home. NOTE: This sheet is a summary. It may not cover all possible information. If you have questions about this medicine, talk to your  doctor, pharmacist, or health care provider.  2018 Elsevier/Gold Standard (2008-06-14 09:30:40)

## 2017-09-08 LAB — HEPATIC FUNCTION PANEL
ALBUMIN: 4 g/dL (ref 3.5–5.5)
ALT: 31 IU/L (ref 0–32)
AST: 19 IU/L (ref 0–40)
Alkaline Phosphatase: 73 IU/L (ref 39–117)
BILIRUBIN TOTAL: 0.2 mg/dL (ref 0.0–1.2)
BILIRUBIN, DIRECT: 0.07 mg/dL (ref 0.00–0.40)
TOTAL PROTEIN: 7.2 g/dL (ref 6.0–8.5)

## 2017-09-08 LAB — LIPASE: Lipase: 23 U/L (ref 14–72)

## 2017-09-09 MED FILL — LANTUS SOLOSTAR 100 UNITS/M: 100 | 28 days supply | Qty: 15 | Fill #0

## 2017-09-10 LAB — H. PYLORI BREATH TEST: H pylori Breath Test: NEGATIVE

## 2017-09-11 MED FILL — glipiZIDE 5 MG TABS: 5 | 30 days supply | Qty: 60 | Fill #3

## 2017-09-11 MED FILL — JARDIANCE 10 MG TABLET: 10 | 30 days supply | Qty: 30 | Fill #4

## 2017-09-15 MED FILL — LISINOPRIL 10 MG TABS: 10 | 90 days supply | Qty: 45 | Fill #3

## 2017-09-21 ENCOUNTER — Other Ambulatory Visit: Payer: Self-pay | Admitting: Cardiovascular Disease

## 2017-09-21 MED FILL — metFORMIN HCL 1000 MG TABS: 1000 | 30 days supply | Qty: 60 | Fill #3

## 2017-09-21 MED FILL — METOPROLOL TARTRATE 25 MG T: 25 | 30 days supply | Qty: 90 | Fill #0

## 2017-09-21 MED FILL — LOVASTATIN 20 MG TABLET: 20 | 30 days supply | Qty: 30 | Fill #7

## 2017-09-22 MED FILL — XARELTO 20 MG TABLET: 20 | 30 days supply | Qty: 30 | Fill #0

## 2017-09-28 ENCOUNTER — Ambulatory Visit (INDEPENDENT_AMBULATORY_CARE_PROVIDER_SITE_OTHER): Payer: BLUE CROSS/BLUE SHIELD | Admitting: Internal Medicine

## 2017-09-28 ENCOUNTER — Encounter: Payer: Self-pay | Admitting: Internal Medicine

## 2017-09-28 VITALS — BP 124/82 | HR 61 | Ht 65.0 in | Wt 245.4 lb

## 2017-09-28 DIAGNOSIS — I481 Persistent atrial fibrillation: Secondary | ICD-10-CM | POA: Diagnosis not present

## 2017-09-28 DIAGNOSIS — I428 Other cardiomyopathies: Secondary | ICD-10-CM | POA: Diagnosis not present

## 2017-09-28 DIAGNOSIS — I4891 Unspecified atrial fibrillation: Secondary | ICD-10-CM | POA: Diagnosis not present

## 2017-09-28 DIAGNOSIS — I10 Essential (primary) hypertension: Secondary | ICD-10-CM | POA: Diagnosis not present

## 2017-09-28 DIAGNOSIS — I4819 Other persistent atrial fibrillation: Secondary | ICD-10-CM

## 2017-09-28 NOTE — Patient Instructions (Addendum)
Medication Instructions:  Your physician recommends that you continue on your current medications as directed. Please refer to the Current Medication list given to you today.  -- If you need a refill on your cardiac medications before your next appointment, please call your pharmacy. --  Labwork: None ordered  Testing/Procedures: Your physician has requested that you have an echocardiogram. Echocardiography is a painless test that uses sound waves to create images of your heart. It provides your doctor with information about the size and shape of your heart and how well your heart's chambers and valves are working. This procedure takes approximately one hour. There are no restrictions for this procedure.    Follow-Up: Your physician wants you to follow-up in: 3 months with Dr. Johney Frame and 6 months with Dr. Elease Hashimoto. You will receive a reminder letter in the mail two months in advance. If you don't receive a letter, please call our office to schedule the follow-up appointment.  Thank you for choosing [facility]!!   Sigurd Sos, RN [phone]  Any Other Special Instructions Will Be Listed Below (If Applicable).

## 2017-09-28 NOTE — Progress Notes (Signed)
PCP: Ladell Pier, MD Primary Cardiologist: DR Nahser  Kelly Lang is a 56 y.o. female who presents today for routine electrophysiology followup.  Since his recent afib ablation, the patient reports doing very well.  she denies procedure related complications and is pleased with the results of the procedure.  Today, she denies symptoms of palpitations, chest pain, shortness of breath,  lower extremity edema, dizziness, presyncope, or syncope.  The patient is otherwise without complaint today.   Past Medical History:  Diagnosis Date  . Arthritis    "all over" (06/30/2017)  . Chicken pox   . Dyslipidemia   . H/O: hypothyroidism    a. thyroid biopsy 1996 with synthroid. Stopped taking rx and was loss to follow up b. normal thyroid function 10/20/13  . High cholesterol   . Hx of cardiovascular stress test    ETT/Lexiscan Myoview (12/2013):  No ischemia; not gated; low risk.  Marland Kitchen Hypertension   . Obesity   . Persistent atrial fibrillation ([place])    a diagnosed 10/25/2013  . Seasonal allergies   . Stroke [facility]) 2016   "some speech problems since" (06/30/2017)  . 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 2/2 Afib with RVR for unknown duration (EF: 40-45%)  . Thyroid disease   . Type II diabetes mellitus ([place])    a. hg A1c 10, newly diagnosed (10/26/13)   Past Surgical History:  Procedure Laterality Date  . ATRIAL FIBRILLATION ABLATION  06/30/2017  . ATRIAL FIBRILLATION ABLATION N/A 06/30/2017   Procedure: Atrial Fibrillation Ablation;  Surgeon: Thompson Grayer, MD;  Location: [place] CV LAB;  Service: Cardiovascular;  Laterality: N/A;  . BIOPSY THYROID  1996  . CARDIOVERSION N/A 12/07/2013   Procedure: CARDIOVERSION;  Surgeon: Casandra Doffing, MD;  Location: [facility] ENDOSCOPY;  Service: Cardiovascular;  Laterality: N/A;  . CARDIOVERSION N/A 03/10/2016   Procedure: CARDIOVERSION;  Surgeon: Thayer Headings, MD;  Location: [place];  Service: Cardiovascular;  Laterality: N/A;  . EYE  MUSCLE SURGERY Right ~ 1966  . TEE WITHOUT CARDIOVERSION N/A 06/29/2017   Procedure: TRANSESOPHAGEAL ECHOCARDIOGRAM (TEE);  Surgeon: Fay Records, MD;  Location: [place];  Service: Cardiovascular;  Laterality: N/A;  . TONSILLECTOMY  1971    ROS- all systems are personally reviewed and negatives except as per HPI above  Current Outpatient Prescriptions  Medication Sig Dispense Refill  . Blood Glucose Monitoring Suppl (ONE TOUCH ULTRA 2) w/Device KIT 1 application by Does not apply route 2 (two) times daily. 1 each 0  . clobetasol (TEMOVATE) 0.05 % external solution Apply small quarter size to affected area on scalp BID x 1 week then PRN 50 mL 1  . diclofenac sodium (VOLTAREN) 1 % GEL Apply 2 g topically 4 (four) times daily. 100 g 0  . empagliflozin (JARDIANCE) 10 MG TABS tablet Take 10 mg by mouth daily. 30 tablet 5  . glipiZIDE (GLUCOTROL) 5 MG tablet Take 1 tablet (5 mg total) by mouth 2 (two) times daily before a meal. 60 tablet 11  . glucose blood (ONE TOUCH TEST STRIPS) test strip Use as instructed 100 each 12  . Insulin Glargine (LANTUS SOLOSTAR) 100 UNIT/ML Solostar Pen Inject 53 Units into the skin daily at 10 pm. 15 mL 2  . Insulin Pen Needle (ULTICARE MICRO PEN NEEDLES) 32G X 4 MM MISC 1 applicator by Does not apply route at bedtime. 100 each 2  . Lancets (ONETOUCH ULTRASOFT) lancets Use as instructed 100 each 12  . lisinopril (PRINIVIL,ZESTRIL) 10  MG tablet Take 0.5 tablets (5 mg total) by mouth daily. 90 tablet 3  . lovastatin (MEVACOR) 20 MG tablet Take 1 tablet (20 mg total) by mouth at bedtime. 90 tablet 3  . metFORMIN (GLUCOPHAGE) 1000 MG tablet Take 1 tablet (1,000 mg total) by mouth 2 (two) times daily with a meal. 60 tablet 2  . metoprolol tartrate (LOPRESSOR) 25 MG tablet TAKE 1 & 1/2 TABLET BY MOUTH 2 TIMES DAILY 270 tablet 2  . omeprazole (PRILOSEC) 20 MG capsule Take 1 capsule (20 mg total) by mouth daily. 30 capsule 3  . triamcinolone cream (KENALOG) 0.1 % Apply 1  application topically 2 (two) times daily. 30 g 1  . XARELTO 20 MG TABS tablet TAKE 1 TABLET BY MOUTH DAILY WITH SUPPER. 30 tablet 10   Current Facility-Administered Medications  Medication Dose Route Frequency Provider Last Rate Last Dose  . 0.9 %  sodium chloride infusion  500 mL Intravenous Continuous Ladene Artist, MD        Physical Exam: Vitals:   09/28/17 1013  BP: 124/82  Pulse: 61  SpO2: 98%  Weight: 245 lb 6.4 oz (111.3 kg)  Height: 5' 5" (1.651 m)    GEN- The patient is well appearing, alert and oriented x 3 today.   Head- normocephalic, atraumatic Eyes-  Sclera clear, conjunctiva pink Ears- hearing intact Oropharynx- clear Lungs- Clear to ausculation bilaterally, normal work of breathing Heart- Regular rate and rhythm, no murmurs, rubs or gallops, PMI not laterally displaced GI- soft, NT, ND, + BS Extremities- no clubbing, cyanosis, or edema  EKG tracing ordered today is personally reviewed and shows sinus rhythm 61 bpm, PR 182 msec, LAHB, nonspecific ST/T changes  Assessment and Plan:  1. Persistent atrial fibrillation Doing well s/p ablation off AAD therapy chads2vasc score is 6 (includes prior stroke),  Continue long term anticoagulation  2. HTN Stable No change required today  3. Obesity Body mass index is 40.84 kg/m. Lifestyle modification encouraged again today She has lost 8 lbs since her ablation!  I am very pleased with this!!  4. Nonischemic CM Possibly tachycardia mediated Repeat echo upon return in 3 months if still in sinus rhythm  Return to see me in 3 months Follow-up with Dr Acie Fredrickson in 6 months  Thompson Grayer MD, [facility] 09/28/2017 10:26 AM

## 2017-10-12 ENCOUNTER — Other Ambulatory Visit: Payer: Self-pay | Admitting: Internal Medicine

## 2017-10-12 DIAGNOSIS — M79604 Pain in right leg: Secondary | ICD-10-CM

## 2017-10-12 DIAGNOSIS — M79602 Pain in left arm: Principal | ICD-10-CM

## 2017-10-12 DIAGNOSIS — M79605 Pain in left leg: Secondary | ICD-10-CM

## 2017-10-12 DIAGNOSIS — M79601 Pain in right arm: Secondary | ICD-10-CM

## 2017-10-12 MED FILL — LANTUS SOLOSTAR 100 UNITS/M: 100 | 28 days supply | Qty: 15 | Fill #1

## 2017-10-12 MED FILL — OMEPRAZOLE DR 20 MG CAPSULE: 20 | 30 days supply | Qty: 30 | Fill #1

## 2017-10-12 MED FILL — JARDIANCE 10 MG TABLET: 10 | 30 days supply | Qty: 30 | Fill #5

## 2017-10-12 MED FILL — glipiZIDE 5 MG TABS: 5 | 30 days supply | Qty: 60 | Fill #4

## 2017-10-13 MED ORDER — DICLOFENAC SODIUM 1 % TD GEL: 2.0000 g | Freq: Four times a day (QID) | TRANSDERMAL | 0 refills | Status: DC

## 2017-10-13 MED FILL — DICLOFENAC SODIUM 1% GEL: 1 | 12 days supply | Qty: 100 | Fill #0

## 2017-10-26 MED FILL — metFORMIN HCL 1000 MG TABS: 1000 | 30 days supply | Qty: 60 | Fill #4

## 2017-10-26 MED FILL — XARELTO 20 MG TABLET: 20 | 30 days supply | Qty: 30 | Fill #1

## 2017-10-26 MED FILL — LOVASTATIN 20 MG TABLET: 20 | 30 days supply | Qty: 30 | Fill #8

## 2017-10-26 MED FILL — METOPROLOL TARTRATE 25 MG T: 25 | 30 days supply | Qty: 90 | Fill #1

## 2017-11-02 ENCOUNTER — Encounter: Payer: Self-pay | Admitting: Internal Medicine

## 2017-11-02 DIAGNOSIS — E1165 Type 2 diabetes mellitus with hyperglycemia: Secondary | ICD-10-CM | POA: Diagnosis not present

## 2017-11-02 DIAGNOSIS — E119 Type 2 diabetes mellitus without complications: Secondary | ICD-10-CM | POA: Diagnosis not present

## 2017-11-02 LAB — HM DIABETES EYE EXAM

## 2017-11-09 ENCOUNTER — Other Ambulatory Visit: Payer: Self-pay | Admitting: Internal Medicine

## 2017-11-09 DIAGNOSIS — E118 Type 2 diabetes mellitus with unspecified complications: Secondary | ICD-10-CM

## 2017-11-09 DIAGNOSIS — L309 Dermatitis, unspecified: Secondary | ICD-10-CM

## 2017-11-09 DIAGNOSIS — Z794 Long term (current) use of insulin: Secondary | ICD-10-CM

## 2017-11-10 MED FILL — OMEPRAZOLE 20 MG CAP: 20 | 30 days supply | Qty: 30 | Fill #2

## 2017-11-10 MED FILL — LANTUS SOLOSTAR 100 UNITS/M: 100 | 28 days supply | Qty: 15 | Fill #2

## 2017-11-10 MED FILL — TRIAMCINOLONE ACETONIDE 0.1: 0.1 | 15 days supply | Qty: 30 | Fill #1

## 2017-11-10 MED FILL — glipiZIDE 5 MG TABS: 5 | 30 days supply | Qty: 60 | Fill #5

## 2017-11-11 MED FILL — JARDIANCE 10 MG TABLET: 10 | 30 days supply | Qty: 30 | Fill #0

## 2017-11-23 ENCOUNTER — Encounter (HOSPITAL_COMMUNITY): Payer: Self-pay | Admitting: *Deleted

## 2017-11-23 ENCOUNTER — Emergency Department (HOSPITAL_COMMUNITY)
Admission: EM | Admit: 2017-11-23 | Discharge: 2017-11-23 | Disposition: A | Discharge status: Home / Self Care | Payer: BLUE CROSS/BLUE SHIELD | Attending: Emergency Medicine | Admitting: Emergency Medicine

## 2017-11-23 ENCOUNTER — Other Ambulatory Visit: Payer: Self-pay

## 2017-11-23 ENCOUNTER — Emergency Department (HOSPITAL_COMMUNITY): Payer: BLUE CROSS/BLUE SHIELD

## 2017-11-23 DIAGNOSIS — R109 Unspecified abdominal pain: Secondary | ICD-10-CM

## 2017-11-23 DIAGNOSIS — R1012 Left upper quadrant pain: Secondary | ICD-10-CM | POA: Diagnosis not present

## 2017-11-23 DIAGNOSIS — I509 Heart failure, unspecified: Secondary | ICD-10-CM | POA: Diagnosis not present

## 2017-11-23 DIAGNOSIS — K760 Fatty (change of) liver, not elsewhere classified: Secondary | ICD-10-CM | POA: Diagnosis not present

## 2017-11-23 DIAGNOSIS — E119 Type 2 diabetes mellitus without complications: Secondary | ICD-10-CM | POA: Diagnosis not present

## 2017-11-23 DIAGNOSIS — R112 Nausea with vomiting, unspecified: Secondary | ICD-10-CM | POA: Diagnosis not present

## 2017-11-23 DIAGNOSIS — I110 Hypertensive heart disease with heart failure: Secondary | ICD-10-CM | POA: Diagnosis not present

## 2017-11-23 DIAGNOSIS — R1013 Epigastric pain: Secondary | ICD-10-CM | POA: Diagnosis not present

## 2017-11-23 DIAGNOSIS — R1084 Generalized abdominal pain: Secondary | ICD-10-CM | POA: Diagnosis not present

## 2017-11-23 LAB — COMPREHENSIVE METABOLIC PANEL
ALBUMIN: 3.6 g/dL (ref 3.5–5.0)
ALK PHOS: 70 U/L (ref 38–126)
ALT: 28 U/L (ref 14–54)
ANION GAP: 10 (ref 5–15)
AST: 20 U/L (ref 15–41)
BILIRUBIN TOTAL: 0.7 mg/dL (ref 0.3–1.2)
BUN: 11 mg/dL (ref 6–20)
CALCIUM: 9.2 mg/dL (ref 8.9–10.3)
CO2: 22 mmol/L (ref 22–32)
CREATININE: 0.7 mg/dL (ref 0.44–1.00)
Chloride: 103 mmol/L (ref 101–111)
GFR calc non Af Amer: >60 mL/min (ref >60)
GLUCOSE: 167 mg/dL — AB (ref 65–99)
Potassium: 4.1 mmol/L (ref 3.5–5.1)
Sodium: 135 mmol/L (ref 135–145)
TOTAL PROTEIN: 7.4 g/dL (ref 6.5–8.1)

## 2017-11-23 LAB — URINALYSIS, ROUTINE W REFLEX MICROSCOPIC
Bacteria, UA: NONE SEEN
Bilirubin Urine: NEGATIVE
Ketones, ur: NEGATIVE mg/dL
Leukocytes, UA: NEGATIVE
NITRITE: NEGATIVE
PH: 5 (ref 5.0–8.0)
Protein, ur: NEGATIVE mg/dL
SPECIFIC GRAVITY, URINE: 1.033 — AB (ref 1.005–1.030)

## 2017-11-23 LAB — CBC
HCT: 43.8 % (ref 36.0–46.0)
HEMOGLOBIN: 15 g/dL (ref 12.0–15.0)
MCH: 32.5 pg (ref 26.0–34.0)
MCHC: 34.2 g/dL (ref 30.0–36.0)
MCV: 95 fL (ref 78.0–100.0)
PLATELETS: 252 10*3/uL (ref 150–400)
RBC: 4.61 MIL/uL (ref 3.87–5.11)
RDW: 13 % (ref 11.5–15.5)
WBC: 8.1 10*3/uL (ref 4.0–10.5)

## 2017-11-23 LAB — I-STAT BETA HCG BLOOD, ED (MC, WL, AP ONLY): I-stat hCG, quantitative: <5 m[IU]/mL (ref <5)

## 2017-11-23 LAB — LIPASE, BLOOD: Lipase: 24 U/L (ref 11–51)

## 2017-11-23 MED ORDER — ONDANSETRON HCL 4 MG/2ML IJ SOLN
4.0000 mg | Freq: Once | INTRAMUSCULAR | Status: AC
  Administered 2017-11-23: 4 mg via INTRAVENOUS
  Filled 2017-11-23: qty 2

## 2017-11-23 MED ORDER — MORPHINE SULFATE (PF) 4 MG/ML IV SOLN
2.0000 mg | Freq: Once | INTRAVENOUS | Status: AC
  Administered 2017-11-23: 2 mg via INTRAVENOUS
  Filled 2017-11-23: qty 1

## 2017-11-23 MED ORDER — TRAMADOL HCL 50 MG PO TABS: 50.0000 mg | ORAL_TABLET | Freq: Two times a day (BID) | ORAL | 0 refills | Status: DC | PRN

## 2017-11-23 MED ORDER — GI COCKTAIL ~~LOC~~
30.0000 mL | Freq: Once | ORAL | Status: AC
  Administered 2017-11-23: 30 mL via ORAL
  Filled 2017-11-23: qty 30

## 2017-11-23 MED ORDER — ONDANSETRON HCL 4 MG PO TABS: 4.0000 mg | ORAL_TABLET | Freq: Four times a day (QID) | ORAL | 0 refills | Status: DC

## 2017-11-23 NOTE — ED Notes (Addendum)
Pt stated that she tolerated the [place] sandwich somewhat poorly. Does not complain of any pain, but stated that she's feeling full and if she eats any more she may throw up, despite not having eaten anything today. Ate 30% of [place] sandwich. Pt also given diet ginger ale as requested.

## 2017-11-23 NOTE — ED Notes (Signed)
ED Provider at bedside. 

## 2017-11-23 NOTE — ED Provider Notes (Signed)
[facility] Provider Note   CSN: [id number] Arrival date & time: 11/23/17  0543     History   Chief Complaint Chief Complaint  Patient presents with  . Abdominal Pain    HPI Kelly Lang is a 56 y.o. female presenting with nausea, vomiting, and abdominal pain.  Patient states that about an hour after eating on Thursday, she had acute onset left upper quadrant/epigastric pain with nausea and vomiting.  She reports persistent vomiting Thursday night.  She has had intermittent vomiting since then.  The pain waxes and wanes, sharp and radiates to her back.  She denies history of similar.  She denies fevers, chills, chest pain, shortness of breath, lower abdominal pain, urinary symptoms, abnormal bowel movements.  She has not taken anything for pain. She denies change in her medications.  She denies history of abdominal surgeries. Her BGL have been elevated since this began.   HPI  Past Medical History:  Diagnosis Date  . Arthritis    "all over" (06/30/2017)  . Chicken pox   . Dyslipidemia   . H/O: hypothyroidism    a. thyroid biopsy 1996 with synthroid. Stopped taking rx and was loss to follow up b. normal thyroid function 10/20/13  . High cholesterol   . Hx of cardiovascular stress test    ETT/Lexiscan Myoview (12/2013):  No ischemia; not gated; low risk.  Kelly [facility] Hypertension   . Obesity   . Persistent atrial fibrillation ([facility])    a diagnosed 10/25/2013  . Seasonal allergies   . Stroke [facility]) 2016   "some speech problems since" (06/30/2017)  . 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 2/2 Afib with RVR for unknown duration (EF: 40-45%)  . Thyroid disease   . Type II diabetes mellitus ([facility])    a. hg A1c 10, newly diagnosed (10/26/13)    Patient Active Problem List   Diagnosis Date Noted  . Chronic systolic CHF (congestive heart failure) ([facility]) 09/16/2015  . Cerebrovascular accident (CVA) due to embolism of left middle cerebral artery  (Kelly)   . Thyroid mass 09/15/2015  . Low back pain 04/03/2014  . Unspecified vitamin D deficiency 01/20/2014  . Essential hypertension, benign 01/20/2014  . Chronic anticoagulation 10/31/2013  . Tachycardia induced cardiomyopathy ([facility])   . H/O: hypothyroidism   . Obesity   . IDDM (insulin dependent diabetes mellitus) (Kelly [facility])   . Arthritis   . Dyslipidemia   . Atrial fibrillation  10/25/2013    Past Surgical History:  Procedure Laterality Date  . ATRIAL FIBRILLATION ABLATION  06/30/2017  . ATRIAL FIBRILLATION ABLATION N/A 06/30/2017   Procedure: Atrial Fibrillation Ablation;  Surgeon: Thompson Grayer, Lang;  Location: [facility];  Service: Cardiovascular;  Laterality: N/A;  . BIOPSY THYROID  1996  . CARDIOVERSION N/A 12/07/2013   Procedure: CARDIOVERSION;  Surgeon: Casandra Doffing, Lang;  Location: [facility];  Service: Cardiovascular;  Laterality: N/A;  . CARDIOVERSION N/A 03/10/2016   Procedure: CARDIOVERSION;  Surgeon: Kelly Lang;  Location: [place];  Service: Cardiovascular;  Laterality: N/A;  . EYE MUSCLE SURGERY Right ~ 1966  . TEE WITHOUT CARDIOVERSION N/A 06/29/2017   Procedure: TRANSESOPHAGEAL ECHOCARDIOGRAM (TEE);  Surgeon: Kelly Lang;  Location: [facility];  Service: Cardiovascular;  Laterality: N/A;  . TONSILLECTOMY  1971    OB History    No data available       Home Medications    Prior to Admission medications   Medication Sig Start Date End  Date Taking? Authorizing Provider  Blood Glucose Monitoring Suppl (ONE TOUCH ULTRA 2) w/Device KIT 1 application by Does not apply route 2 (two) times daily. 11/19/16  Yes Langeland, Dawn T, Lang  clobetasol (TEMOVATE) 0.05 % external solution Apply small quarter size to affected area on scalp BID x 1 week then PRN Patient taking differently: Apply 1 application topically daily as needed (scalp care). Apply small quarter size to affected area on scalp BID x 1 week then PRN 09/07/17  Yes Ladell Pier, Lang    diclofenac sodium (VOLTAREN) 1 % GEL Apply 2 g 4 (four) times daily topically. Patient taking differently: Apply 2 g topically 4 (four) times daily as needed (pain).  10/13/17  Yes Ladell Pier, Lang  glipiZIDE (GLUCOTROL) 5 MG tablet Take 1 tablet (5 mg total) by mouth 2 (two) times daily before a meal. 05/12/17  Yes Ladell Pier, Lang  glucose blood (ONE TOUCH TEST STRIPS) test strip Use as instructed 11/19/16  Yes Langeland, Dawn T, Lang  Insulin Glargine (LANTUS SOLOSTAR) 100 UNIT/ML Solostar Pen Inject 53 Units into the skin daily at 10 pm. Patient taking differently: Inject 60 Units into the skin daily at 10 pm.  08/31/17  Yes Ladell Pier, Lang  Insulin Pen Needle (ULTICARE MICRO PEN NEEDLES) 32G X 4 MM MISC 1 applicator by Does not apply route at bedtime. 11/19/16  Yes Langeland, Dawn T, Lang  JARDIANCE 10 MG TABS tablet TAKE 1 TABLET BY MOUTH DAILY. 11/11/17  Yes Ladell Pier, Lang  Lancets [facility] ULTRASOFT) lancets Use as instructed 11/19/16  Yes Langeland, Dawn T, Lang  lisinopril (PRINIVIL,ZESTRIL) 10 MG tablet Take 0.5 tablets (5 mg total) by mouth daily. 11/19/16  Yes Langeland, Dawn T, Lang  lovastatin (MEVACOR) 20 MG tablet Take 1 tablet (20 mg total) by mouth at bedtime. 11/19/16  Yes Maren Reamer, Lang  metFORMIN (GLUCOPHAGE) 1000 MG tablet Take 1 tablet (1,000 mg total) by mouth 2 (two) times daily with a meal. 04/22/17  Yes Jegede, Olugbemiga E, Lang  metoprolol tartrate (LOPRESSOR) 25 MG tablet TAKE 1 & 1/2 TABLET BY MOUTH 2 TIMES DAILY 09/21/17  Yes Nahser, Wonda Cheng, Lang  omeprazole (PRILOSEC) 20 MG capsule Take 1 capsule (20 mg total) by mouth daily. 09/07/17  Yes Ladell Pier, Lang  triamcinolone cream (KENALOG) 0.1 % Apply 1 application topically 2 (two) times daily. Patient taking differently: Apply 1 application topically 2 (two) times daily as needed (rash).  09/07/17  Yes Ladell Pier, Lang  XARELTO 20 MG TABS tablet TAKE 1 TABLET BY MOUTH DAILY WITH  SUPPER. 09/22/17  Yes Allred, Jeneen Rinks, Lang  ondansetron (ZOFRAN) 4 MG tablet Take 1 tablet (4 mg total) by mouth every 6 (six) hours. 11/23/17   [name] Backer, PA-C  traMADol (ULTRAM) 50 MG tablet Take 1 tablet (50 mg total) by mouth every 12 (twelve) hours as needed for severe pain. 11/23/17   Rashon Westrup, PA-C    Family History Family History  Adopted: Yes  Problem Relation Age of Onset  . Hypertension Mother   . Hyperlipidemia Mother     Social History Social History   Tobacco Use  . Smoking status: Former Smoker    Packs/day: 1.00    Years: 22.00    Pack years: 22.00    Types: Cigarettes    Last attempt to quit: 12/01/2010    Years since quitting: 6.9  . Smokeless tobacco: Never Used  Substance Use Topics  . Alcohol use:  Yes    Comment: 06/30/2017 "glass of wine maybe q other month"  . Drug use: No     Allergies   Codeine   Review of Systems Review of Systems  Constitutional: Negative for chills and fever.  HENT: Negative for congestion.   Respiratory: Negative for cough, chest tightness and shortness of breath.   Cardiovascular: Negative for chest pain.  Gastrointestinal: Positive for abdominal pain, nausea and vomiting. Negative for abdominal distention, blood in stool, constipation and diarrhea.  Genitourinary: Negative for dysuria, frequency and hematuria.  Skin: Negative for rash.  Allergic/Immunologic: Negative for immunocompromised state.  Neurological: Negative for dizziness and headaches.  Hematological: Does not bruise/bleed easily.  Psychiatric/Behavioral: Negative for confusion.     Physical Exam Updated Vital Signs BP 126/71 (BP Location: Left Arm)   Pulse 80   Temp 98.7 F (37.1 C)   Resp 20   Ht 5' 5.5" (1.664 m)   Wt 108.9 kg (240 lb)   SpO2 94%   BMI 39.33 kg/m   Physical Exam  Constitutional: She is oriented to person, place, and time. She appears well-developed and well-nourished. No distress.  HENT:  Head: Normocephalic  and atraumatic.  Eyes: EOM are normal.  Neck: Normal range of motion.  Cardiovascular: Normal rate, regular rhythm and intact distal pulses.  Pulmonary/Chest: Effort normal and breath sounds normal. No respiratory distress. She has no wheezes.  Abdominal: Soft. Bowel sounds are normal. She exhibits no distension and no mass. There is tenderness. There is no rebound and no guarding.  Generalized TTP of the abd, worse in LUQ and epigastric. No rigidity, guarding or distention.   Musculoskeletal: Normal range of motion.  Neurological: She is alert and oriented to person, place, and time.  Skin: Skin is warm and dry.  Psychiatric: She has a normal mood and affect.  Nursing note and vitals reviewed.    ED Treatments / Results  Labs (all labs ordered are listed, but only abnormal results are displayed) Labs Reviewed  COMPREHENSIVE METABOLIC PANEL - Abnormal; Notable for the following components:      Result Value   Glucose, Bld 167 (*)    All other components within normal limits  URINALYSIS, ROUTINE W REFLEX MICROSCOPIC - Abnormal; Notable for the following components:   Specific Gravity, Urine 1.033 (*)    Glucose, UA >=500 (*)    Hgb urine dipstick SMALL (*)    Squamous Epithelial / LPF 0-5 (*)    All other components within normal limits  LIPASE, BLOOD  CBC  I-STAT BETA HCG BLOOD, ED ([facility], [facility], [facility] ONLY)    EKG  EKG Interpretation None       Radiology Ct Renal Stone Study  Result Date: 11/23/2017 CLINICAL DATA:  Abdominal pain and hematuria EXAM: CT ABDOMEN AND PELVIS WITHOUT CONTRAST TECHNIQUE: Multidetector CT imaging of the abdomen and pelvis was performed following the standard protocol without oral or intravenous contrast material administration. COMPARISON:  None. FINDINGS: Lower chest: There is mild bibasilar atelectasis. There is no airspace consolidation in the lung bases. Hepatobiliary: There is a degree of hepatic steatosis. No focal liver lesions are evident on  this noncontrast enhanced study. Gallbladder wall is not appreciably thickened. There is no biliary duct dilatation. Pancreas: No pancreatic mass or inflammatory focus. Spleen: There is a calcified granuloma in the posterior spleen. Spleen otherwise appears unremarkable on this noncontrast enhanced study. Adrenals/Urinary Tract: Adrenals appear normal bilaterally. Kidneys bilaterally show no evident mass or hydronephrosis on either side. There is no  renal or ureteral calculus on either side. Urinary bladder is midline with wall thickness within normal limits. Stomach/Bowel: There are scattered diverticula in the descending and proximal sigmoid colon regions. No diverticulitis evident. There is no appreciable bowel wall or mesenteric thickening. No bowel obstruction. No free air or portal venous air. Vascular/Lymphatic: There is no abdominal aortic aneurysm. There are scattered foci of atherosclerotic calcification in the aorta. Major mesenteric vessels appear patent on this noncontrast enhanced study. No adenopathy is appreciable in the abdomen or pelvis. Reproductive: Uterus is anteverted. There is no evident pelvic mass. Other: Appendix appears normal. There is no appreciable abscess or ascites in the abdomen or pelvis. There is a rather minimal ventral hernia containing only fat. Musculoskeletal: There are foci of degenerative change in the lower thoracic and lumbar spine regions. There are no blastic or lytic bone lesions. There is no intramuscular or abdominal wall lesion evident. IMPRESSION: 1. No renal or ureteral calculus. No hydronephrosis. Urinary bladder wall not thickened. A cause for hematuria has not been established with this study. 2. Scattered diverticula in the left colon. No bowel obstruction or bowel wall thickening. No abscess. Appendix appears normal. 3.  Hepatic steatosis. 4.  Aortic atherosclerosis.  No evident aneurysm. 5.  Rather minimal ventral hernia containing only fat. Aortic  Atherosclerosis (ICD10-I70.0). Electronically Signed   By: Lowella Grip III M.D.   On: 11/23/2017 10:11    Procedures Procedures (including critical care time)  Medications Ordered in ED Medications  ondansetron (ZOFRAN) injection 4 mg (4 mg Intravenous Given 11/23/17 0744)  morphine 4 MG/ML injection 2 mg (2 mg Intravenous Given 11/23/17 0744)  gi cocktail (Maalox,Lidocaine,Donnatal) (30 mLs Oral Given 11/23/17 1135)     Initial Impression / Assessment and Plan / ED Course  I have reviewed the triage vital signs and the nursing notes.  Pertinent labs & imaging results that were available during my care of the patient were reviewed by me and considered in my medical decision making (see chart for details).     Patient presenting for evaluation of abdominal pain, nausea, vomiting.  Physical examination, she is afebrile not tachycardic.  Generalized abdominal pain with worse pain in the left upper quadrant and epigastric.  No rigidity, guarding, distention.  Basic abdominal labs and urine pending.  Will give Zofran and morphine for symptom control.  No fluids at this time, as patient has a history of CHF.  Will reassess need for imaging after labs return.  Labs reassuring, lipase normal.  No leukocytosis.  No I abnormalities, creatinine stable.  Urine with elevated glucose and small blood.  No ketones or signs of UTI.  Will check for kidney stone.  On reassessment, patient states symptoms are much improved.  She is agreeable to plan.  She negative for stone or other acute abnormality.  Discussed findings with patient.  Discussed that at this time, there is no indication of infection, obstruction, perforation, or other emergent process.  Will discharge with symptomatic treatment.  Patient to follow-up with primary.  At this time, patient appears safe for discharge.  Return precautions given.  Patient states she understands and agrees to plan.   Final Clinical Impressions(s) / ED  Diagnoses   Final diagnoses:  Non-intractable vomiting with nausea, unspecified vomiting type  Acute abdominal pain    ED Discharge Orders        Ordered    ondansetron (ZOFRAN) 4 MG tablet  Every 6 hours     11/23/17 1105    traMADol (  ULTRAM) 50 MG tablet  Every 12 hours PRN     11/23/17 1105       Caedence Snowden, PA-C 11/23/17 1526    Ward, Delice Bison, DO 11/23/17 2340

## 2017-11-23 NOTE — ED Notes (Signed)
Pt given [place] sandwich and water for Fluid/PO Challenge.

## 2017-11-23 NOTE — ED Triage Notes (Signed)
The pt has had rt flank and abd pain  For 2 days but worse tonight nausea but no vomiting    No hx

## 2017-11-23 NOTE — Discharge Instructions (Signed)
Use Zofran as needed for nausea or vomiting. Take Tylenol 3 times a day for pain.  Try to avoid anti-inflammatories, as this can cause bleeding. Use Ultram as needed for breakthrough pain.   Continue taking all your other at home medications as prescribed. Follow-up with your primary care doctor for further evaluation of your symptoms. Return to the emergency room if you develop persistent high fevers, persistent vomiting despite medication, or any new or worsening symptoms.

## 2017-11-23 NOTE — ED Notes (Signed)
Patient transported to CT 

## 2017-11-25 ENCOUNTER — Other Ambulatory Visit: Payer: Self-pay | Admitting: Internal Medicine

## 2017-11-25 ENCOUNTER — Ambulatory Visit: Payer: BLUE CROSS/BLUE SHIELD | Attending: Internal Medicine | Admitting: Physician Assistant

## 2017-11-25 VITALS — BP 116/82 | HR 66 | Temp 98.4°F | Resp 16 | Wt 243.6 lb

## 2017-11-25 DIAGNOSIS — Z09 Encounter for follow-up examination after completed treatment for conditions other than malignant neoplasm: Secondary | ICD-10-CM | POA: Diagnosis not present

## 2017-11-25 DIAGNOSIS — R1084 Generalized abdominal pain: Secondary | ICD-10-CM | POA: Insufficient documentation

## 2017-11-25 DIAGNOSIS — IMO0001 Reserved for inherently not codable concepts without codable children: Secondary | ICD-10-CM

## 2017-11-25 DIAGNOSIS — Z79899 Other long term (current) drug therapy: Secondary | ICD-10-CM | POA: Diagnosis not present

## 2017-11-25 DIAGNOSIS — E039 Hypothyroidism, unspecified: Secondary | ICD-10-CM | POA: Diagnosis not present

## 2017-11-25 DIAGNOSIS — E119 Type 2 diabetes mellitus without complications: Secondary | ICD-10-CM

## 2017-11-25 DIAGNOSIS — Z794 Long term (current) use of insulin: Secondary | ICD-10-CM

## 2017-11-25 DIAGNOSIS — E669 Obesity, unspecified: Secondary | ICD-10-CM | POA: Insufficient documentation

## 2017-11-25 DIAGNOSIS — I10 Essential (primary) hypertension: Secondary | ICD-10-CM | POA: Insufficient documentation

## 2017-11-25 DIAGNOSIS — Z7901 Long term (current) use of anticoagulants: Secondary | ICD-10-CM | POA: Insufficient documentation

## 2017-11-25 DIAGNOSIS — E7800 Pure hypercholesterolemia, unspecified: Secondary | ICD-10-CM | POA: Diagnosis not present

## 2017-11-25 DIAGNOSIS — I481 Persistent atrial fibrillation: Secondary | ICD-10-CM | POA: Insufficient documentation

## 2017-11-25 DIAGNOSIS — Z8673 Personal history of transient ischemic attack (TIA), and cerebral infarction without residual deficits: Secondary | ICD-10-CM | POA: Diagnosis not present

## 2017-11-25 LAB — GLUCOSE, POCT (MANUAL RESULT ENTRY): POC Glucose: 141 mg/dl — AB (ref 70–99)

## 2017-11-25 MED ORDER — ONDANSETRON HCL 4 MG PO TABS: 4.0000 mg | ORAL_TABLET | Freq: Four times a day (QID) | ORAL | 0 refills | Status: DC

## 2017-11-25 MED ORDER — TRAMADOL HCL 50 MG PO TABS: 50.0000 mg | ORAL_TABLET | Freq: Two times a day (BID) | ORAL | 0 refills | Status: DC | PRN

## 2017-11-25 MED FILL — metFORMIN HCL 1000 MG TABS: 1000 | 30 days supply | Qty: 60 | Fill #2

## 2017-11-25 MED FILL — METOPROLOL TARTRATE 25 MG T: 25 | 30 days supply | Qty: 90 | Fill #2

## 2017-11-25 MED FILL — LOVASTATIN 20 MG TABLET: 20 | 30 days supply | Qty: 30 | Fill #0

## 2017-11-25 MED FILL — XARELTO 20 MG TABLET: 20 | 30 days supply | Qty: 30 | Fill #2

## 2017-11-25 NOTE — Patient Instructions (Signed)
To emergency room if fever develops, abdominal pain increases

## 2017-11-25 NOTE — Progress Notes (Signed)
Patient ID: Kelly Lang, female   DOB: Feb 21, 1961, 56 y.o.   MRN: [id number]     Kelly Lang, is a 56 y.o. female  ONG:[id number]  GMW:[id number]  DOB - December 02, 1960  Subjective:  Chief Complaint and HPI: Kelly Lang is a 56 y.o. female here today for a follow up visit After being seen in the ED 11/23/2017 for abdominal pain, N/V.  Labs overall unremarkable other than glucose=167.  She was treated with morphine, zofran, IVF, and GI cocktail.  negative for kidney stone. CT abdomen negative overall.  She was discharged with tramadol and zofran. Pain is persistent but not as severe as initially.  No more vomiting, some nausea.  Taking ~ 1 tramadol/day and zofran about 3 times daily.  No fever.  Appetite is returning but only eating bland and soft foods.  No urinary s/sx.  Pain is constant but waxes and wanes in intensity and is generalized at this point.  No changes in BM.  No blood in stool.  Blood sugars running under 200.  No hyper/hypoglycemia.   ED/Hospital notes reviewed.    ROS:   Constitutional:  No f/c, No night sweats, No unexplained weight loss. EENT:  No vision changes, No blurry vision, No hearing changes. No mouth, throat, or ear problems.  Respiratory: No cough, No SOB Cardiac: No CP, no palpitations GI:  + abd pain, + N Vomiting is resolved. No diarrhea. GU: No Urinary s/sx Musculoskeletal: No joint pain Neuro: No headache, no dizziness, no motor weakness.  Skin: No rash Endocrine:  No polydipsia. No polyuria.  Psych: Denies SI/HI  No problems updated.  ALLERGIES: Allergies  Allergen Reactions  . Codeine Itching and Rash    PAST MEDICAL HISTORY: Past Medical History:  Diagnosis Date  . Arthritis    "all over" (06/30/2017)  . Chicken pox   . Dyslipidemia   . H/O: hypothyroidism    a. thyroid biopsy 1996 with synthroid. Stopped taking rx and was loss to follow up b. normal thyroid function 10/20/13  . High cholesterol   . Hx of cardiovascular stress test      ETT/Lexiscan Myoview (12/2013):  No ischemia; not gated; low risk.  Marland Kitchen Hypertension   . Obesity   . Persistent atrial fibrillation ([place])    a diagnosed 10/25/2013  . Seasonal allergies   . Stroke [facility]) 2016   "some speech problems since" (06/30/2017)  . 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 2/2 Afib with RVR for unknown duration (EF: 40-45%)  . Thyroid disease   . Type II diabetes mellitus ([place])    a. hg A1c 10, newly diagnosed (10/26/13)    MEDICATIONS AT HOME: Prior to Admission medications   Medication Sig Start Date End Date Taking? Authorizing Provider  Blood Glucose Monitoring Suppl (ONE TOUCH ULTRA 2) w/Device KIT 1 application by Does not apply route 2 (two) times daily. 11/19/16   Langeland, Leda Quail, MD  clobetasol (TEMOVATE) 0.05 % external solution Apply small quarter size to affected area on scalp BID x 1 week then PRN Patient taking differently: Apply 1 application topically daily as needed (scalp care). Apply small quarter size to affected area on scalp BID x 1 week then PRN 09/07/17   Ladell Pier, MD  diclofenac sodium (VOLTAREN) 1 % GEL Apply 2 g 4 (four) times daily topically. Patient taking differently: Apply 2 g topically 4 (four) times daily as needed (pain).  10/13/17   Ladell Pier, MD  glipiZIDE (GLUCOTROL) 5 MG tablet Take  1 tablet (5 mg total) by mouth 2 (two) times daily before a meal. 05/12/17   Ladell Pier, MD  glucose blood (ONE TOUCH TEST STRIPS) test strip Use as instructed 11/19/16   Lottie Mussel T, MD  Insulin Glargine (LANTUS SOLOSTAR) 100 UNIT/ML Solostar Pen Inject 53 Units into the skin daily at 10 pm. Patient taking differently: Inject 60 Units into the skin daily at 10 pm.  08/31/17   Ladell Pier, MD  Insulin Pen Needle (ULTICARE MICRO PEN NEEDLES) 32G X 4 MM MISC 1 applicator by Does not apply route at bedtime. 11/19/16   Langeland, Dawn T, MD  JARDIANCE 10 MG TABS tablet TAKE 1 TABLET BY MOUTH DAILY. 11/11/17    Ladell Pier, MD  Lancets [facility] ULTRASOFT) lancets Use as instructed 11/19/16   Lottie Mussel T, MD  lisinopril (PRINIVIL,ZESTRIL) 10 MG tablet Take 0.5 tablets (5 mg total) by mouth daily. 11/19/16   Maren Reamer, MD  lovastatin (MEVACOR) 20 MG tablet TAKE 1 TABLET BY MOUTH AT BEDTIME. 11/25/17   Ladell Pier, MD  metFORMIN (GLUCOPHAGE) 1000 MG tablet Take 1 tablet (1,000 mg total) by mouth 2 (two) times daily with a meal. 04/22/17   Jegede, Olugbemiga E, MD  metoprolol tartrate (LOPRESSOR) 25 MG tablet TAKE 1 & 1/2 TABLET BY MOUTH 2 TIMES DAILY 09/21/17   Nahser, Wonda Cheng, MD  omeprazole (PRILOSEC) 20 MG capsule Take 1 capsule (20 mg total) by mouth daily. 09/07/17   Ladell Pier, MD  ondansetron (ZOFRAN) 4 MG tablet Take 1 tablet (4 mg total) by mouth every 6 (six) hours. 11/25/17   Argentina Donovan, PA-C  traMADol (ULTRAM) 50 MG tablet Take 1 tablet (50 mg total) by mouth every 12 (twelve) hours as needed for severe pain. 11/25/17   Argentina Donovan, PA-C  triamcinolone cream (KENALOG) 0.1 % Apply 1 application topically 2 (two) times daily. Patient taking differently: Apply 1 application topically 2 (two) times daily as needed (rash).  09/07/17   Ladell Pier, MD  XARELTO 20 MG TABS tablet TAKE 1 TABLET BY MOUTH DAILY WITH SUPPER. 09/22/17   Allred, Jeneen Rinks, MD     Objective:  EXAM:   Vitals:   11/25/17 0939  BP: 116/82  Pulse: 66  Resp: 16  Temp: 98.4 F (36.9 C)  TempSrc: Oral  SpO2: 95%  Weight: 243 lb 9.6 oz (110.5 kg)    General appearance : A&OX3. NAD. Non-toxic-appearing HEENT: Atraumatic and Normocephalic.  PERRLA. EOM intact.   Neck: supple, no JVD. No cervical lymphadenopathy. No thyromegaly Chest/Lungs:  Breathing-non-labored, Good air entry bilaterally, breath sounds normal without rales, rhonchi, or wheezing  CVS: S1 S2 regular, no murmurs, gallops, rubs  Abdomen: Bowel sounds present, tenderness throughout-no point tenderness and  seems to lessens with distraction.  No guarding, no rebound.   Extremities: Bilateral Lower Ext shows no edema, both legs are warm to touch with = pulse throughout Neurology:  CN II-XII grossly intact, Non focal.   Psych:  TP linear. J/I WNL. Normal speech. Appropriate eye contact and affect.  Skin:  No Rash  Data Review Lab Results  Component Value Date   HGBA1C 7.0 09/07/2017   HGBA1C 8.2 05/11/2017   HGBA1C 9.0 11/19/2016     Assessment & Plan   1. Generalized abdominal pain Non- acute abdomen.  Resolving viral syndrome most likely-will recheck labs today and add h. Pylori.   - H. pylori breath test - Comprehensive metabolic panel - Lipase -  CBC with Differential/Platelet - traMADol (ULTRAM) 50 MG tablet; Take 1 tablet (50 mg total) by mouth every 12 (twelve) hours as needed for severe pain.  Dispense: 12 tablet; Refill: 0 - ondansetron (ZOFRAN) 4 MG tablet; Take 1 tablet (4 mg total) by mouth every 6 (six) hours.  Dispense: 12 tablet; Refill: 0  2. IDDM (insulin dependent diabetes mellitus) (HCC) Adequate control-continue current regimen.   - Glucose (CBG)  3. Hospital discharge follow-up Improving, but not well. OOW X 2 days- slowly graduate diet.  Fluids and rest.     Patient have been counseled extensively about nutrition and exercise  Return in about 6 weeks (around 01/06/2018) for Dr Wynetta Emery; DM .  The patient was given clear instructions to go to ER or return to medical center if symptoms don't improve, worsen or new problems develop. The patient verbalized understanding. The patient was told to call to get lab results if they haven't heard anything in the next week.     Freeman Caldron, PA-C [facility] [place], Tobias   11/25/2017, 9:57 AM

## 2017-11-26 LAB — CBC WITH DIFFERENTIAL/PLATELET
BASOS: 1 %
Basophils Absolute: 0.1 10*3/uL (ref 0.0–0.2)
EOS (ABSOLUTE): 0.3 10*3/uL (ref 0.0–0.4)
EOS: 4 %
HEMATOCRIT: 46.2 % (ref 34.0–46.6)
Hemoglobin: 15.4 g/dL (ref 11.1–15.9)
IMMATURE GRANS (ABS): 0 10*3/uL (ref 0.0–0.1)
IMMATURE GRANULOCYTES: 0 %
LYMPHS: 26 %
Lymphocytes Absolute: 1.7 10*3/uL (ref 0.7–3.1)
MCH: 32 pg (ref 26.6–33.0)
MCHC: 33.3 g/dL (ref 31.5–35.7)
MCV: 96 fL (ref 79–97)
Monocytes Absolute: 0.5 10*3/uL (ref 0.1–0.9)
Monocytes: 7 %
NEUTROS PCT: 62 %
Neutrophils Absolute: 4.2 10*3/uL (ref 1.4–7.0)
PLATELETS: 272 10*3/uL (ref 150–379)
RBC: 4.81 x10E6/uL (ref 3.77–5.28)
RDW: 13.7 % (ref 12.3–15.4)
WBC: 6.7 10*3/uL (ref 3.4–10.8)

## 2017-11-26 LAB — COMPREHENSIVE METABOLIC PANEL
ALT: 33 IU/L — AB (ref 0–32)
AST: 18 IU/L (ref 0–40)
Albumin/Globulin Ratio: 1.4 (ref 1.2–2.2)
Albumin: 4.2 g/dL (ref 3.5–5.5)
Alkaline Phosphatase: 72 IU/L (ref 39–117)
BUN/Creatinine Ratio: 20 (ref 9–23)
BUN: 14 mg/dL (ref 6–24)
Bilirubin Total: 0.3 mg/dL (ref 0.0–1.2)
CALCIUM: 9.5 mg/dL (ref 8.7–10.2)
CO2: 23 mmol/L (ref 20–29)
Chloride: 102 mmol/L (ref 96–106)
Creatinine, Ser: 0.69 mg/dL (ref 0.57–1.00)
GFR, EST AFRICAN AMERICAN: 114 mL/min/{1.73_m2} (ref >59)
GFR, EST NON AFRICAN AMERICAN: 99 mL/min/{1.73_m2} (ref >59)
GLUCOSE: 126 mg/dL — AB (ref 65–99)
Globulin, Total: 3 g/dL (ref 1.5–4.5)
Potassium: 5.1 mmol/L (ref 3.5–5.2)
Sodium: 143 mmol/L (ref 134–144)
TOTAL PROTEIN: 7.2 g/dL (ref 6.0–8.5)

## 2017-11-26 LAB — H. PYLORI BREATH TEST: H PYLORI BREATH TEST: NEGATIVE

## 2017-11-26 LAB — LIPASE: Lipase: 19 U/L (ref 14–72)

## 2017-11-30 ENCOUNTER — Telehealth: Payer: Self-pay

## 2017-11-30 DIAGNOSIS — R1010 Upper abdominal pain, unspecified: Secondary | ICD-10-CM

## 2017-11-30 NOTE — Telephone Encounter (Signed)
Contacted pt to go over lab results pt is aware of results.  Pt states the symptoms is getting worse to where she can't sleep at night and she doesn't know what to do and the pain is getting worse. I informed pt that she will need to go to the ED. Pt states she doesn't want to go to the ED and asked if her pcp was her I informed pt that Dr. Laural Benes will not be back in the office till Thursday. Pt states the symptoms started when she first started seeing Dr. Laural Benes and per Dr. Laural Benes if the symptoms persist she will refer her to GI. Pt is requesting a GI referral because she wants to see GI this week. I informed pt that the referral may placed this week but it may it just may not be this week she may see a GI doctor so I informed pt that I do prefer her to go to the ED. Pt states every time she goes to the ED they just send her home like as if nothing is wrong with her. May you please follow up with her

## 2017-12-01 NOTE — Addendum Note (Signed)
Addended by: Jonah Blue B on: 12/01/2017 07:54 PM   Modules accepted: Orders

## 2017-12-02 ENCOUNTER — Other Ambulatory Visit: Payer: Self-pay

## 2017-12-02 ENCOUNTER — Emergency Department (HOSPITAL_COMMUNITY)
Admission: EM | Admit: 2017-12-02 | Discharge: 2017-12-02 | Disposition: A | Discharge status: Home / Self Care | Payer: BLUE CROSS/BLUE SHIELD | Attending: Emergency Medicine | Admitting: Emergency Medicine

## 2017-12-02 ENCOUNTER — Encounter (HOSPITAL_COMMUNITY): Payer: Self-pay | Admitting: Emergency Medicine

## 2017-12-02 ENCOUNTER — Emergency Department (HOSPITAL_COMMUNITY): Payer: BLUE CROSS/BLUE SHIELD

## 2017-12-02 DIAGNOSIS — I5022 Chronic systolic (congestive) heart failure: Secondary | ICD-10-CM | POA: Diagnosis not present

## 2017-12-02 DIAGNOSIS — Z8673 Personal history of transient ischemic attack (TIA), and cerebral infarction without residual deficits: Secondary | ICD-10-CM | POA: Diagnosis not present

## 2017-12-02 DIAGNOSIS — Z7901 Long term (current) use of anticoagulants: Secondary | ICD-10-CM | POA: Insufficient documentation

## 2017-12-02 DIAGNOSIS — Z87891 Personal history of nicotine dependence: Secondary | ICD-10-CM | POA: Insufficient documentation

## 2017-12-02 DIAGNOSIS — K5900 Constipation, unspecified: Secondary | ICD-10-CM | POA: Insufficient documentation

## 2017-12-02 DIAGNOSIS — R109 Unspecified abdominal pain: Secondary | ICD-10-CM | POA: Diagnosis not present

## 2017-12-02 DIAGNOSIS — E039 Hypothyroidism, unspecified: Secondary | ICD-10-CM | POA: Insufficient documentation

## 2017-12-02 DIAGNOSIS — Z794 Long term (current) use of insulin: Secondary | ICD-10-CM | POA: Diagnosis not present

## 2017-12-02 DIAGNOSIS — Z79899 Other long term (current) drug therapy: Secondary | ICD-10-CM | POA: Diagnosis not present

## 2017-12-02 DIAGNOSIS — E119 Type 2 diabetes mellitus without complications: Secondary | ICD-10-CM | POA: Insufficient documentation

## 2017-12-02 DIAGNOSIS — I110 Hypertensive heart disease with heart failure: Secondary | ICD-10-CM | POA: Insufficient documentation

## 2017-12-02 DIAGNOSIS — I444 Left anterior fascicular block: Secondary | ICD-10-CM | POA: Diagnosis not present

## 2017-12-02 DIAGNOSIS — R110 Nausea: Secondary | ICD-10-CM | POA: Insufficient documentation

## 2017-12-02 DIAGNOSIS — K760 Fatty (change of) liver, not elsewhere classified: Secondary | ICD-10-CM | POA: Diagnosis not present

## 2017-12-02 DIAGNOSIS — R1013 Epigastric pain: Secondary | ICD-10-CM | POA: Diagnosis not present

## 2017-12-02 LAB — COMPREHENSIVE METABOLIC PANEL
ALBUMIN: 3.9 g/dL (ref 3.5–5.0)
ALK PHOS: 60 U/L (ref 38–126)
ALT: 29 U/L (ref 14–54)
AST: 21 U/L (ref 15–41)
Anion gap: 11 (ref 5–15)
BILIRUBIN TOTAL: 0.8 mg/dL (ref 0.3–1.2)
BUN: 8 mg/dL (ref 6–20)
CALCIUM: 9 mg/dL (ref 8.9–10.3)
CO2: 22 mmol/L (ref 22–32)
Chloride: 101 mmol/L (ref 101–111)
Creatinine, Ser: 0.64 mg/dL (ref 0.44–1.00)
GFR calc Af Amer: >60 mL/min (ref >60)
GFR calc non Af Amer: >60 mL/min (ref >60)
GLUCOSE: 142 mg/dL — AB (ref 65–99)
Potassium: 4.2 mmol/L (ref 3.5–5.1)
SODIUM: 134 mmol/L — AB (ref 135–145)
TOTAL PROTEIN: 7.8 g/dL (ref 6.5–8.1)

## 2017-12-02 LAB — I-STAT BETA HCG BLOOD, ED (MC, WL, AP ONLY): I-stat hCG, quantitative: 5.7 m[IU]/mL — ABNORMAL HIGH (ref <5)

## 2017-12-02 LAB — I-STAT CG4 LACTIC ACID, ED
LACTIC ACID, VENOUS: 1.2 mmol/L (ref 0.5–1.9)
LACTIC ACID, VENOUS: 0.86 mmol/L (ref 0.5–1.9)

## 2017-12-02 LAB — CBC
HEMATOCRIT: 47.1 % — AB (ref 36.0–46.0)
Hemoglobin: 15.9 g/dL — ABNORMAL HIGH (ref 12.0–15.0)
MCH: 32.3 pg (ref 26.0–34.0)
MCHC: 33.8 g/dL (ref 30.0–36.0)
MCV: 95.5 fL (ref 78.0–100.0)
Platelets: 231 10*3/uL (ref 150–400)
RBC: 4.93 MIL/uL (ref 3.87–5.11)
RDW: 13.1 % (ref 11.5–15.5)
WBC: 7.4 10*3/uL (ref 4.0–10.5)

## 2017-12-02 LAB — URINALYSIS, ROUTINE W REFLEX MICROSCOPIC
BILIRUBIN URINE: NEGATIVE
Glucose, UA: >=500 mg/dL — AB
HGB URINE DIPSTICK: NEGATIVE
Leukocytes, UA: NEGATIVE
Nitrite: NEGATIVE
PH: 5 (ref 5.0–8.0)
Protein, ur: NEGATIVE mg/dL

## 2017-12-02 LAB — LIPASE, BLOOD: Lipase: 23 U/L (ref 11–51)

## 2017-12-02 LAB — I-STAT TROPONIN, ED: Troponin i, poc: 0 ng/mL (ref 0.00–0.08)

## 2017-12-02 LAB — URINALYSIS, MICROSCOPIC (REFLEX)

## 2017-12-02 MED ORDER — FENTANYL CITRATE (PF) 100 MCG/2ML IJ SOLN
50.0000 ug | Freq: Once | INTRAMUSCULAR | Status: AC
  Administered 2017-12-02: 50 ug via INTRAVENOUS
  Filled 2017-12-02: qty 2

## 2017-12-02 MED ORDER — IOPAMIDOL (ISOVUE-300) INJECTION 61%
INTRAVENOUS | Status: AC
  Administered 2017-12-02: 100 mL
  Filled 2017-12-02: qty 100

## 2017-12-02 MED ORDER — SODIUM CHLORIDE 0.9 % IV BOLUS (SEPSIS)
1000.0000 mL | Freq: Once | INTRAVENOUS | Status: AC
  Administered 2017-12-02: 1000 mL via INTRAVENOUS

## 2017-12-02 MED ORDER — SUCRALFATE 1 G PO TABS: 1.0000 g | ORAL_TABLET | Freq: Three times a day (TID) | ORAL | 0 refills | Status: DC

## 2017-12-02 NOTE — ED Triage Notes (Signed)
Pt has been having RUQ pain and right flank since 12/24 and has been since here and by PCP with no answers of what this pain is coming from. Pt here today due to pain being to better. Pt reports decrease food intake because it hurts worse with food.

## 2017-12-02 NOTE — ED Provider Notes (Signed)
[facility] EMERGENCY DEPARTMENT Provider Note   CSN: [id number] Arrival date & time: 12/02/17  1031     History   Chief Complaint Chief Complaint  Patient presents with  . Abdominal Pain    HPI Kelly Lang is a 57 y.o. female.  The history is provided by the patient. No language interpreter was used.  Abdominal Pain      Kelly Lang is a 57 y.o. female who presents to the Emergency Department complaining of abdominal pain.  She reports epigastric abdominal pain that is been present since December 23.  Pain is present in her epigastrium as well as her mid back.  Pain is constant in nature but worse with meals.  She has associated nausea.  No fevers, chest pain, shortness of breath, vomiting, dysuria, diarrhea.  She does have mild associated constipation.  She has been seen in the emergency department as well as by her primary care provider for this pain.  She had a CT stone study that was negative for acute disease process as well as a negative breath urea nitrogen test.  She does have a history of atrial fibrillation, status post ablation last summer.  She is currently anticoagulated with Xarelto.  Past Medical History:  Diagnosis Date  . Arthritis    "all over" (06/30/2017)  . Chicken pox   . Dyslipidemia   . H/O: hypothyroidism    a. thyroid biopsy 1996 with synthroid. Stopped taking rx and was loss to follow up b. normal thyroid function 10/20/13  . High cholesterol   . Hx of cardiovascular stress test    ETT/Lexiscan Myoview (12/2013):  No ischemia; not gated; low risk.  Marland Kitchen Hypertension   . Obesity   . Persistent atrial fibrillation ([place])    a diagnosed 10/25/2013  . Seasonal allergies   . Stroke [facility]) 2016   "some speech problems since" (06/30/2017)  . 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 2/2 Afib with RVR for unknown duration (EF: 40-45%)  . Thyroid disease   . Type II diabetes mellitus ([place])    a. hg A1c 10, newly diagnosed  (10/26/13)    Patient Active Problem List   Diagnosis Date Noted  . Chronic systolic CHF (congestive heart failure) ([place]) 09/16/2015  . Cerebrovascular accident (CVA) due to embolism of left middle cerebral artery ([place])   . Thyroid mass 09/15/2015  . Low back pain 04/03/2014  . Unspecified vitamin D deficiency 01/20/2014  . Essential hypertension, benign 01/20/2014  . Chronic anticoagulation 10/31/2013  . Tachycardia induced cardiomyopathy ([place])   . H/O: hypothyroidism   . Obesity   . IDDM (insulin dependent diabetes mellitus) ([place])   . Arthritis   . Dyslipidemia   . Atrial fibrillation  10/25/2013    Past Surgical History:  Procedure Laterality Date  . ATRIAL FIBRILLATION ABLATION  06/30/2017  . ATRIAL FIBRILLATION ABLATION N/A 06/30/2017   Procedure: Atrial Fibrillation Ablation;  Surgeon: Thompson Grayer, MD;  Location: [place] CV LAB;  Service: Cardiovascular;  Laterality: N/A;  . BIOPSY THYROID  1996  . CARDIOVERSION N/A 12/07/2013   Procedure: CARDIOVERSION;  Surgeon: Casandra Doffing, MD;  Location: [facility] ENDOSCOPY;  Service: Cardiovascular;  Laterality: N/A;  . CARDIOVERSION N/A 03/10/2016   Procedure: CARDIOVERSION;  Surgeon: Thayer Headings, MD;  Location: [place];  Service: Cardiovascular;  Laterality: N/A;  . EYE MUSCLE SURGERY Right ~ 1966  . TEE WITHOUT CARDIOVERSION N/A 06/29/2017   Procedure: TRANSESOPHAGEAL ECHOCARDIOGRAM (TEE);  Surgeon: Fay Records, MD;  Location: [facility] ENDOSCOPY;  Service: Cardiovascular;  Laterality: N/A;  . TONSILLECTOMY  1971    OB History    No data available       Home Medications    Prior to Admission medications   Medication Sig Start Date End Date Taking? Authorizing Provider  clobetasol (TEMOVATE) 0.05 % external solution Apply small quarter size to affected area on scalp BID x 1 week then PRN Patient taking differently: Apply 1 application topically daily as needed (scalp care). Apply small quarter size to affected area on scalp  BID x 1 week then PRN 09/07/17  Yes Ladell Pier, MD  diclofenac sodium (VOLTAREN) 1 % GEL Apply 2 g 4 (four) times daily topically. Patient taking differently: Apply 2 g topically 4 (four) times daily as needed (pain).  10/13/17  Yes Ladell Pier, MD  glipiZIDE (GLUCOTROL) 5 MG tablet Take 1 tablet (5 mg total) by mouth 2 (two) times daily before a meal. 05/12/17  Yes Ladell Pier, MD  Insulin Glargine (LANTUS SOLOSTAR) 100 UNIT/ML Solostar Pen Inject 53 Units into the skin daily at 10 pm. Patient taking differently: Inject 60 Units into the skin daily at 10 pm.  08/31/17  Yes Ladell Pier, MD  JARDIANCE 10 MG TABS tablet TAKE 1 TABLET BY MOUTH DAILY. 11/11/17  Yes Ladell Pier, MD  lisinopril (PRINIVIL,ZESTRIL) 10 MG tablet Take 0.5 tablets (5 mg total) by mouth daily. 11/19/16  Yes Langeland, Dawn T, MD  lovastatin (MEVACOR) 20 MG tablet TAKE 1 TABLET BY MOUTH AT BEDTIME. 11/25/17  Yes Ladell Pier, MD  metFORMIN (GLUCOPHAGE) 1000 MG tablet Take 1 tablet (1,000 mg total) by mouth 2 (two) times daily with a meal. 04/22/17  Yes Jegede, Olugbemiga E, MD  metoprolol tartrate (LOPRESSOR) 25 MG tablet TAKE 1 & 1/2 TABLET BY MOUTH 2 TIMES DAILY Patient taking differently: TAKE 1 & 1/2 TABLET(37.102m) BY MOUTH 2 TIMES DAILY 09/21/17  Yes Nahser, PWonda Cheng MD  omeprazole (PRILOSEC) 20 MG capsule Take 1 capsule (20 mg total) by mouth daily. 09/07/17  Yes JLadell Pier MD  ondansetron (ZOFRAN) 4 MG tablet Take 1 tablet (4 mg total) by mouth every 6 (six) hours. Patient taking differently: Take 4 mg by mouth every 6 (six) hours as needed for nausea.  11/25/17  Yes MFreeman CaldronM, PA-C  traMADol (ULTRAM) 50 MG tablet Take 1 tablet (50 mg total) by mouth every 12 (twelve) hours as needed for severe pain. 11/25/17  Yes MFreeman CaldronM, PA-C  triamcinolone cream (KENALOG) 0.1 % Apply 1 application topically 2 (two) times daily. Patient taking differently: Apply 1  application topically 2 (two) times daily as needed (rash).  09/07/17  Yes JLadell Pier MD  XARELTO 20 MG TABS tablet TAKE 1 TABLET BY MOUTH DAILY WITH SUPPER. 09/22/17  Yes Allred, JJeneen Rinks MD  Blood Glucose Monitoring Suppl (ONE TOUCH ULTRA 2) w/Device KIT 1 application by Does not apply route 2 (two) times daily. 11/19/16   LLottie MusselT, MD  glucose blood (ONE TOUCH TEST STRIPS) test strip Use as instructed 11/19/16   Langeland, Dawn T, MD  Insulin Pen Needle (ULTICARE MICRO PEN NEEDLES) 32G X 4 MM MISC 1 applicator by Does not apply route at bedtime. 11/19/16   LMaren Reamer MD  Lancets ([facility]) lancets Use as instructed 11/19/16   LLottie MusselT, MD  sucralfate (CARAFATE) 1 g tablet Take 1 tablet (1 g total) by mouth 4 (four) times daily -  with meals and at bedtime. 12/02/17   Quintella Reichert, MD    Family History Family History  Adopted: Yes  Problem Relation Age of Onset  . Hypertension Mother   . Hyperlipidemia Mother     Social History Social History   Tobacco Use  . Smoking status: Former Smoker    Packs/day: 1.00    Years: 22.00    Pack years: 22.00    Types: Cigarettes    Last attempt to quit: 12/01/2010    Years since quitting: 7.0  . Smokeless tobacco: Never Used  Substance Use Topics  . Alcohol use: Yes    Comment: 06/30/2017 "glass of wine maybe q other month"  . Drug use: No     Allergies   Codeine   Review of Systems Review of Systems  Gastrointestinal: Positive for abdominal pain.  All other systems reviewed and are negative.    Physical Exam Updated Vital Signs BP 129/83   Pulse 82   Temp 98.4 F (36.9 C) (Oral)   Resp 19   SpO2 98%   Physical Exam  Constitutional: She is oriented to person, place, and time. She appears well-developed and well-nourished.  HENT:  Head: Normocephalic and atraumatic.  Cardiovascular: Normal rate and regular rhythm.  No murmur heard. Pulmonary/Chest: Effort normal and breath sounds  normal. No respiratory distress.  Abdominal: Soft.  Moderate tenderness throughout the upper abdomen with voluntary guarding, no rebound.  There is mild lower abdominal tenderness.  Musculoskeletal: She exhibits no edema or tenderness.  Neurological: She is alert and oriented to person, place, and time.  Skin: Skin is warm and dry.  Psychiatric: She has a normal mood and affect. Her behavior is normal.  Nursing note and vitals reviewed.    ED Treatments / Results  Labs (all labs ordered are listed, but only abnormal results are displayed) Labs Reviewed  COMPREHENSIVE METABOLIC PANEL - Abnormal; Notable for the following components:      Result Value   Sodium 134 (*)    Glucose, Bld 142 (*)    All other components within normal limits  CBC - Abnormal; Notable for the following components:   Hemoglobin 15.9 (*)    HCT 47.1 (*)    All other components within normal limits  URINALYSIS, ROUTINE W REFLEX MICROSCOPIC - Abnormal; Notable for the following components:   Specific Gravity, Urine >1.030 (*)    Glucose, UA >=500 (*)    Ketones, ur >80 (*)    All other components within normal limits  URINALYSIS, MICROSCOPIC (REFLEX) - Abnormal; Notable for the following components:   Bacteria, UA RARE (*)    Squamous Epithelial / LPF 0-5 (*)    All other components within normal limits  I-STAT BETA HCG BLOOD, ED ([facility], [facility], [facility] ONLY) - Abnormal; Notable for the following components:   I-stat hCG, quantitative 5.7 (*)    All other components within normal limits  LIPASE, BLOOD  I-STAT TROPONIN, ED  I-STAT CG4 LACTIC ACID, ED  I-STAT CG4 LACTIC ACID, ED    EKG  EKG Interpretation None       Radiology US Abdomen Complete  Result Date: 12/02/2017 CLINICAL DATA:  57 year old hypertensive diabetic female presenting with right upper quadrant pain and abdominal pain for 10 days. Subsequent encounter. EXAM: ABDOMEN ULTRASOUND COMPLETE COMPARISON:  11/23/2017 CT. FINDINGS: Gallbladder: No  gallstones or wall thickening visualized. No sonographic Murphy sign noted by sonographer. Common bile duct: Diameter: 5.1 mm Liver: Diffuse increased echogenicity consistent with fatty  infiltration without focal mass. Portal vein is patent on color Doppler imaging with normal direction of blood flow towards the liver. IVC: No abnormality visualized. Pancreas: Not well visualized secondary to bowel gas. Spleen: Tiny granuloma otherwise negative. Right Kidney: Length: 13.8 cm. Echogenicity within normal limits. No mass or hydronephrosis visualized. Left Kidney: Length: 14.3 cm. Echogenicity within normal limits. No mass or hydronephrosis visualized. Abdominal aorta: Suboptimally evaluated secondary to habitus and bowel gas. Portions visualized unremarkable. Other findings: None. IMPRESSION: Exam is slightly limited by habitus. Fatty liver. No findings of gallbladder inflammation or gallstones. Majority of the pancreas and portions of the aorta not well delineated secondary to bowel gas. Electronically Signed   By: Genia Del M.D.   On: 12/02/2017 19:22   Ct Abdomen Pelvis W Contrast  Result Date: 12/02/2017 CLINICAL DATA:  Left lower quadrant abdominal pain radiating through to the back. Nausea for 10 days. EXAM: CT ABDOMEN AND PELVIS WITH CONTRAST TECHNIQUE: Multidetector CT imaging of the abdomen and pelvis was performed using the standard protocol following bolus administration of intravenous contrast. CONTRAST:  100 mL Isovue-300 COMPARISON:  11/23/2017 FINDINGS: Lower chest: Scarring in the left lung base. Hepatobiliary: Diffuse fatty infiltration of the liver. Gallbladder and bile ducts are unremarkable. Pancreas: Fatty infiltration of the pancreas. No pancreatic ductal dilatation or surrounding inflammatory changes. Spleen: Calcified granuloma in the spleen.  No splenomegaly. Adrenals/Urinary Tract: No adrenal gland nodules. Kidneys are symmetrical. No solid mass or hydronephrosis. Bladder is  decompressed. No specific abnormality identified. Stomach/Bowel: Stomach, small bowel, and colon are mostly decompressed. Scattered stool in the colon. Scattered colonic diverticula. No inflammatory changes. Appendix is normal. Vascular/Lymphatic: Aortic atherosclerosis. No enlarged abdominal or pelvic lymph nodes. Reproductive: Uterus and bilateral adnexa are unremarkable. Other: Small periumbilical hernia containing fat. No free air or free fluid in the abdomen. Musculoskeletal: No acute or significant osseous findings. IMPRESSION: 1. Diffuse fatty infiltration of the liver. 2. Fatty infiltration of the pancreas. 3. Colonic diverticulosis without diverticulitis. 4. Aortic atherosclerosis. 5. Small periumbilical hernia containing fat. Electronically Signed   By: Lucienne Capers M.D.   On: 12/02/2017 20:59    Procedures Procedures (including critical care time)  Medications Ordered in ED Medications  sodium chloride 0.9 % bolus 1,000 mL (0 mLs Intravenous Stopped 12/02/17 1800)  fentaNYL (SUBLIMAZE) injection 50 mcg (50 mcg Intravenous Given 12/02/17 2017)  iopamidol (ISOVUE-300) 61 % injection (100 mLs  Contrast Given 12/02/17 2038)     Initial Impression / Assessment and Plan / ED Course  I have reviewed the triage vital signs and the nursing notes.  Pertinent labs & imaging results that were available during my care of the patient were reviewed by me and considered in my medical decision making (see chart for details).     Here for evaluation of epigastric abdominal pain, worse with meals for the last week.  She does have significant upper abdominal tenderness on initial evaluation, improved after treatment.  Ultrasound is negative for gallstones or cholecystitis.  CT abdomen obtained that is negative for acute disease process.  Counseled patient on clear etiology of her symptoms.  Will treat for possible gastritis versus peptic ulcer disease.  Discussed close PCP follow-up as well as GI follow-up  and return precautions.  Presentation is not consistent with ACS, PE, dissection. Final Clinical Impressions(s) / ED Diagnoses   Final diagnoses:  Epigastric abdominal pain    ED Discharge Orders        Ordered    sucralfate (CARAFATE) 1 g  tablet  3 times daily with meals & bedtime     12/02/17 2153       Quintella Reichert, MD 12/03/17 [phone]

## 2017-12-02 NOTE — ED Notes (Signed)
Pt verbalizes understanding of d/c instructions. Pt received prescriptions. Pt ambulatory at d/c with all belongings.  

## 2017-12-02 NOTE — ED Notes (Signed)
Patient transported to CT 

## 2017-12-03 NOTE — ED Notes (Signed)
Remaining of Fentanyl wasted with Tori, RN after pt was discharged.

## 2017-12-04 ENCOUNTER — Encounter: Payer: Self-pay | Admitting: Gastroenterology

## 2017-12-04 ENCOUNTER — Ambulatory Visit: Payer: BLUE CROSS/BLUE SHIELD | Admitting: Gastroenterology

## 2017-12-04 VITALS — BP 100/84 | HR 80 | Ht 65.5 in | Wt 237.1 lb

## 2017-12-04 DIAGNOSIS — Z7901 Long term (current) use of anticoagulants: Secondary | ICD-10-CM | POA: Diagnosis not present

## 2017-12-04 DIAGNOSIS — R1013 Epigastric pain: Secondary | ICD-10-CM

## 2017-12-04 DIAGNOSIS — K760 Fatty (change of) liver, not elsewhere classified: Secondary | ICD-10-CM | POA: Insufficient documentation

## 2017-12-04 MED ORDER — OMEPRAZOLE 40 MG PO CPDR: 40.0000 mg | DELAYED_RELEASE_CAPSULE | Freq: Two times a day (BID) | ORAL | 1 refills | Status: DC

## 2017-12-04 MED ORDER — OMEPRAZOLE 40 MG PO CPDR: 40.0000 mg | DELAYED_RELEASE_CAPSULE | Freq: Every day | ORAL | 1 refills | Status: DC

## 2017-12-04 NOTE — Patient Instructions (Addendum)
We have sent the following medications to your pharmacy for you to pick up at your convenience: omeprazole 40 mg twice daily x 2 months.   Remain on a soft bland diet.   You have been scheduled for an endoscopy. Please follow written instructions given to you at your visit today. If you use inhalers (even only as needed), please bring them with you on the day of your procedure. Your physician has requested that you go to www.startemmi.com and enter the access code given to you at your visit today. This web site gives a general overview about your procedure. However, you should still follow specific instructions given to you by our office regarding your preparation for the procedure.   Normal BMI (Body Mass Index- based on height and weight) is between 19 and 25. Your BMI today is Body mass index is 38.86 kg/m. Marland Kitchen Please consider follow up  regarding your BMI with your Primary Care Provider.  Thank you for choosing me and [facility].  Venita Lick. Pleas Koch., MD., Clementeen Graham

## 2017-12-04 NOTE — Progress Notes (Addendum)
History of Present Illness: This is a 57 year old female with epigastric pain for 2 weeks.  She was evaluated in the emergency department on 12/24 and 1/2.  Those records were reviewed.  Blood work was unremarkable.  Imaging studies outlined below.  She relates the sudden onset of epigastric pain associated with nausea and vomiting about 2 days before Christmas.  The nausea and vomiting has gradually resolved however she has had persistently decreased appetite.  Any food intake leads to worsening abdominal pain that occasionally radiates to her back.  A couple days ago she started Carafate and noted an immediate improvement in symptoms.  She has been maintained on daily omeprazole for several months due to postprandial abdominal bloating however the symptoms were mild.  She relates about a 10 pound weight loss.  Denies constipation, diarrhea, change in stool caliber, melena, hematochezia, dysphagia, chest pain.  Abd [place] 12/02/2017 MPRESSION: Exam is slightly limited by habitus. Fatty liver. No findings of gallbladder inflammation or gallstones. Majority of the pancreas and portions of the aorta not well delineated secondary to bowel gas.  Abd/pelvic CT 12/02/2017 IMPRESSION: 1. Diffuse fatty infiltration of the liver. 2. Fatty infiltration of the pancreas. 3. Colonic diverticulosis without diverticulitis. 4. Aortic atherosclerosis. 5. Small periumbilical hernia containing fat.  Colonoscopy 03/2017: - Diverticulosis in the sigmoid colon. - The examination was otherwise normal on direct and retroflexion views. - No specimens collected.  Current Medications, Allergies, Past Medical History, Past Surgical History, Family History and Social History were reviewed in [employer] record.  Physical Exam: General: Well developed, well nourished, no acute distress Head: Normocephalic and atraumatic Eyes:  sclerae anicteric, EOMI Ears: Normal auditory acuity Mouth: No deformity  or lesions Lungs: Clear throughout to auscultation Heart: Regular rate and rhythm; no murmurs, rubs or bruits Abdomen: Soft, non tender and non distended. No masses, hepatosplenomegaly or hernias noted. Normal Bowel sounds Rectal: note done Musculoskeletal: Symmetrical with no gross deformities  Pulses:  Normal pulses noted Extremities: No clubbing, cyanosis, edema or deformities noted Neurological: Alert oriented x 4, grossly nonfocal Psychological:  Alert and cooperative. Normal mood and affect  Assessment and Recommendations:  1. Persistent epigastric pain associated with nausea, vomiting, decreased appetite and weight loss.  Rule out ulcer, gastritis, esophagitis.  Increase omeprazole to 40 mg twice daily.  Continue Carafate before meals and at bedtime.  No Carafate after midnight prior to her endoscopy.  Avoid all ASA NSAIDs.  The risks (including bleeding, perforation, infection, missed lesions, medication reactions and possible hospitalization or surgery if complications occur), benefits, and alternatives to endoscopy with possible biopsy and possible dilation were discussed with the patient and they consent to proceed.   2.  Chronic anticoagulation with Xarelto for afib. Hold Xarelto 2 days before procedure - will instruct when and how to resume after procedure. Low but real risk of cardiovascular event such as heart attack, stroke, embolism, thrombosis or ischemia/infarct of other organs off Xarleto explained and need to seek urgent help if this occurs. The patient consents to proceed.  In April 2018 she was cleared by Dr. Elease Hashimoto to hold Xarelto 2 days prior to her colonoscopy.  She had a follow-up appointment with Dr. Johney Frame in late October and was stable with persistent atrial fibrillation. We will follow previous cardiac clearance for 2-day hold of Xarelto for EGD on Monday.  3. Hepatic steatosis.  Long-term weight loss, carb modified weight loss program supervised by her PCP along with  optimal management of  diabetes mellitus and dyslipidemia.   4.  Small umbilical hernia containing fat. This does not appear to be the source of her symptoms.

## 2017-12-05 ENCOUNTER — Other Ambulatory Visit: Payer: Self-pay

## 2017-12-05 ENCOUNTER — Encounter (HOSPITAL_COMMUNITY): Payer: Self-pay | Admitting: *Deleted

## 2017-12-05 ENCOUNTER — Inpatient Hospital Stay (HOSPITAL_COMMUNITY)
Admission: EM | Admit: 2017-12-05 | Discharge: 2017-12-09 | DRG: 073 | Disposition: A | Discharge status: Home / Self Care | Payer: BLUE CROSS/BLUE SHIELD | Attending: Internal Medicine | Admitting: Internal Medicine

## 2017-12-05 DIAGNOSIS — Z794 Long term (current) use of insulin: Secondary | ICD-10-CM | POA: Diagnosis not present

## 2017-12-05 DIAGNOSIS — K226 Gastro-esophageal laceration-hemorrhage syndrome: Secondary | ICD-10-CM | POA: Diagnosis present

## 2017-12-05 DIAGNOSIS — L309 Dermatitis, unspecified: Secondary | ICD-10-CM

## 2017-12-05 DIAGNOSIS — M1990 Unspecified osteoarthritis, unspecified site: Secondary | ICD-10-CM | POA: Diagnosis present

## 2017-12-05 DIAGNOSIS — Z8249 Family history of ischemic heart disease and other diseases of the circulatory system: Secondary | ICD-10-CM

## 2017-12-05 DIAGNOSIS — E785 Hyperlipidemia, unspecified: Secondary | ICD-10-CM | POA: Diagnosis present

## 2017-12-05 DIAGNOSIS — Z79899 Other long term (current) drug therapy: Secondary | ICD-10-CM

## 2017-12-05 DIAGNOSIS — R109 Unspecified abdominal pain: Secondary | ICD-10-CM | POA: Diagnosis present

## 2017-12-05 DIAGNOSIS — Z7901 Long term (current) use of anticoagulants: Secondary | ICD-10-CM

## 2017-12-05 DIAGNOSIS — R1013 Epigastric pain: Secondary | ICD-10-CM | POA: Diagnosis not present

## 2017-12-05 DIAGNOSIS — E872 Acidosis: Secondary | ICD-10-CM | POA: Diagnosis present

## 2017-12-05 DIAGNOSIS — IMO0001 Reserved for inherently not codable concepts without codable children: Secondary | ICD-10-CM

## 2017-12-05 DIAGNOSIS — Z6838 Body mass index (BMI) 38.0-38.9, adult: Secondary | ICD-10-CM

## 2017-12-05 DIAGNOSIS — E039 Hypothyroidism, unspecified: Secondary | ICD-10-CM | POA: Diagnosis present

## 2017-12-05 DIAGNOSIS — E118 Type 2 diabetes mellitus with unspecified complications: Secondary | ICD-10-CM

## 2017-12-05 DIAGNOSIS — K921 Melena: Secondary | ICD-10-CM | POA: Diagnosis not present

## 2017-12-05 DIAGNOSIS — E119 Type 2 diabetes mellitus without complications: Secondary | ICD-10-CM

## 2017-12-05 DIAGNOSIS — I481 Persistent atrial fibrillation: Secondary | ICD-10-CM | POA: Diagnosis not present

## 2017-12-05 DIAGNOSIS — J302 Other seasonal allergic rhinitis: Secondary | ICD-10-CM | POA: Diagnosis not present

## 2017-12-05 DIAGNOSIS — K5730 Diverticulosis of large intestine without perforation or abscess without bleeding: Secondary | ICD-10-CM | POA: Diagnosis not present

## 2017-12-05 DIAGNOSIS — I5022 Chronic systolic (congestive) heart failure: Secondary | ICD-10-CM | POA: Diagnosis not present

## 2017-12-05 DIAGNOSIS — E7800 Pure hypercholesterolemia, unspecified: Secondary | ICD-10-CM | POA: Diagnosis present

## 2017-12-05 DIAGNOSIS — I110 Hypertensive heart disease with heart failure: Secondary | ICD-10-CM | POA: Diagnosis not present

## 2017-12-05 DIAGNOSIS — G43A0 Cyclical vomiting, not intractable: Secondary | ICD-10-CM | POA: Diagnosis not present

## 2017-12-05 DIAGNOSIS — Z8673 Personal history of transient ischemic attack (TIA), and cerebral infarction without residual deficits: Secondary | ICD-10-CM | POA: Diagnosis not present

## 2017-12-05 DIAGNOSIS — M79604 Pain in right leg: Secondary | ICD-10-CM

## 2017-12-05 DIAGNOSIS — Z885 Allergy status to narcotic agent status: Secondary | ICD-10-CM

## 2017-12-05 DIAGNOSIS — Z87891 Personal history of nicotine dependence: Secondary | ICD-10-CM

## 2017-12-05 DIAGNOSIS — R1111 Vomiting without nausea: Secondary | ICD-10-CM | POA: Diagnosis not present

## 2017-12-05 DIAGNOSIS — M79602 Pain in left arm: Secondary | ICD-10-CM

## 2017-12-05 DIAGNOSIS — K3184 Gastroparesis: Secondary | ICD-10-CM | POA: Diagnosis not present

## 2017-12-05 DIAGNOSIS — K920 Hematemesis: Secondary | ICD-10-CM | POA: Diagnosis not present

## 2017-12-05 DIAGNOSIS — M79605 Pain in left leg: Secondary | ICD-10-CM

## 2017-12-05 DIAGNOSIS — K219 Gastro-esophageal reflux disease without esophagitis: Secondary | ICD-10-CM | POA: Diagnosis not present

## 2017-12-05 DIAGNOSIS — E1143 Type 2 diabetes mellitus with diabetic autonomic (poly)neuropathy: Secondary | ICD-10-CM | POA: Diagnosis not present

## 2017-12-05 DIAGNOSIS — M79601 Pain in right arm: Secondary | ICD-10-CM

## 2017-12-05 DIAGNOSIS — R197 Diarrhea, unspecified: Secondary | ICD-10-CM | POA: Diagnosis not present

## 2017-12-05 DIAGNOSIS — R112 Nausea with vomiting, unspecified: Secondary | ICD-10-CM | POA: Diagnosis not present

## 2017-12-05 DIAGNOSIS — R1084 Generalized abdominal pain: Secondary | ICD-10-CM | POA: Diagnosis not present

## 2017-12-05 DIAGNOSIS — K922 Gastrointestinal hemorrhage, unspecified: Secondary | ICD-10-CM | POA: Diagnosis present

## 2017-12-05 DIAGNOSIS — R1115 Cyclical vomiting syndrome unrelated to migraine: Secondary | ICD-10-CM

## 2017-12-05 DIAGNOSIS — I10 Essential (primary) hypertension: Secondary | ICD-10-CM | POA: Diagnosis not present

## 2017-12-05 LAB — URINALYSIS, ROUTINE W REFLEX MICROSCOPIC
Bacteria, UA: NONE SEEN
Bilirubin Urine: NEGATIVE
HGB URINE DIPSTICK: NEGATIVE
Ketones, ur: 80 mg/dL — AB
Leukocytes, UA: NEGATIVE
Nitrite: NEGATIVE
Protein, ur: NEGATIVE mg/dL
SPECIFIC GRAVITY, URINE: 1.039 — AB (ref 1.005–1.030)
pH: 5 (ref 5.0–8.0)

## 2017-12-05 LAB — COMPREHENSIVE METABOLIC PANEL
ALBUMIN: 3.8 g/dL (ref 3.5–5.0)
ALK PHOS: 60 U/L (ref 38–126)
ALT: 51 U/L (ref 14–54)
AST: 34 U/L (ref 15–41)
Anion gap: 11 (ref 5–15)
BILIRUBIN TOTAL: 0.9 mg/dL (ref 0.3–1.2)
BUN: 9 mg/dL (ref 6–20)
CALCIUM: 9.3 mg/dL (ref 8.9–10.3)
CO2: 21 mmol/L — ABNORMAL LOW (ref 22–32)
Chloride: 105 mmol/L (ref 101–111)
Creatinine, Ser: 0.63 mg/dL (ref 0.44–1.00)
GFR calc Af Amer: >60 mL/min (ref >60)
GFR calc non Af Amer: >60 mL/min (ref >60)
GLUCOSE: 171 mg/dL — AB (ref 65–99)
Potassium: 4.5 mmol/L (ref 3.5–5.1)
Sodium: 137 mmol/L (ref 135–145)
TOTAL PROTEIN: 7.9 g/dL (ref 6.5–8.1)

## 2017-12-05 LAB — POC OCCULT BLOOD, ED: Fecal Occult Bld: POSITIVE — AB

## 2017-12-05 LAB — CBC
HCT: 46.8 % — ABNORMAL HIGH (ref 36.0–46.0)
HCT: 43.9 % (ref 36.0–46.0)
Hemoglobin: 15.7 g/dL — ABNORMAL HIGH (ref 12.0–15.0)
Hemoglobin: 14.6 g/dL (ref 12.0–15.0)
MCH: 32 pg (ref 26.0–34.0)
MCH: 32 pg (ref 26.0–34.0)
MCHC: 33.5 g/dL (ref 30.0–36.0)
MCHC: 33.3 g/dL (ref 30.0–36.0)
MCV: 95.3 fL (ref 78.0–100.0)
MCV: 96.3 fL (ref 78.0–100.0)
Platelets: 232 10*3/uL (ref 150–400)
Platelets: 236 10*3/uL (ref 150–400)
RBC: 4.91 MIL/uL (ref 3.87–5.11)
RBC: 4.56 MIL/uL (ref 3.87–5.11)
RDW: 13 % (ref 11.5–15.5)
RDW: 13.1 % (ref 11.5–15.5)
WBC: 11.1 10*3/uL — ABNORMAL HIGH (ref 4.0–10.5)
WBC: 9.2 10*3/uL (ref 4.0–10.5)

## 2017-12-05 LAB — I-STAT BETA HCG BLOOD, ED (MC, WL, AP ONLY)

## 2017-12-05 LAB — LIPASE, BLOOD: Lipase: 23 U/L (ref 11–51)

## 2017-12-05 MED ORDER — ONDANSETRON 4 MG PO TBDP
4.0000 mg | ORAL_TABLET | Freq: Once | ORAL | Status: AC | PRN
  Administered 2017-12-05: 4 mg via ORAL
  Filled 2017-12-05: qty 1

## 2017-12-05 MED ORDER — OXYCODONE-ACETAMINOPHEN 5-325 MG PO TABS
1.0000 | ORAL_TABLET | ORAL | Status: DC | PRN
  Administered 2017-12-05: 1 via ORAL
  Filled 2017-12-05: qty 1

## 2017-12-05 MED ORDER — GI COCKTAIL ~~LOC~~
30.0000 mL | Freq: Once | ORAL | Status: AC
  Administered 2017-12-05: 30 mL via ORAL
  Filled 2017-12-05: qty 30

## 2017-12-05 NOTE — ED Provider Notes (Signed)
[place] EMERGENCY DEPARTMENT Provider Note   CSN: [id number] Arrival date & time: 12/05/17  1813     History   Chief Complaint Chief Complaint  Patient presents with  . Abdominal Pain    HPI Kelly Lang is a 57 y.o. female.  57yo F w/ PMH including A fib on anticoagulation, CVA, HTN, T2DM, CHF who p/w epigastric pain. Pt has been dealing with epigastric pain since December and has had ED visits, CT scans and several evaluations. Kelly Lang saw GI yesterday and they increased her PPI, scheduled her for endoscopy in 3 days. Kelly Lang has been taking this medication along with carafate and the carafate has been mildly helping. Kelly Lang started having diarrhea yesterday.   Kelly Lang continues to have epigastric pain but at 4pm today, Kelly Lang had a sudden worsening of the pain. About 5 minutes after taking carafate, Kelly Lang had some "red" diarrhea followed by vomiting with blood tinge. No fevers, urinary sx or cough/cold sx. No nausea currently but continues to have moderate to severe epigastric pain.    The history is provided by the patient.    Past Medical History:  Diagnosis Date  . Arthritis    "all over" (06/30/2017)  . Chicken pox   . Dyslipidemia   . H/O: hypothyroidism    a. thyroid biopsy 1996 with synthroid. Stopped taking rx and was loss to follow up b. normal thyroid function 10/20/13  . High cholesterol   . Hx of cardiovascular stress test    ETT/Lexiscan Myoview (12/2013):  No ischemia; not gated; low risk.  Marland Kitchen Hypertension   . Obesity   . Persistent atrial fibrillation ([place])    a diagnosed 10/25/2013  . Seasonal allergies   . Stroke [facility]) 2016   "some speech problems since" (06/30/2017)  . 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 2/2 Afib with RVR for unknown duration (EF: 40-45%)  . Thyroid disease   . Type II diabetes mellitus ([place])    a. hg A1c 10, newly diagnosed (10/26/13)    Patient Active Problem List   Diagnosis Date Noted  . Abdominal pain 12/06/2017    . Hepatic steatosis 12/04/2017  . Chronic systolic CHF (congestive heart failure) ([place]) 09/16/2015  . Cerebrovascular accident (CVA) due to embolism of left middle cerebral artery ([place])   . Thyroid mass 09/15/2015  . Low back pain 04/03/2014  . Unspecified vitamin D deficiency 01/20/2014  . Essential hypertension, benign 01/20/2014  . Chronic anticoagulation 10/31/2013  . Tachycardia induced cardiomyopathy ([place])   . H/O: hypothyroidism   . Obesity   . IDDM (insulin dependent diabetes mellitus) ([place])   . Arthritis   . Dyslipidemia   . Atrial fibrillation  10/25/2013    Past Surgical History:  Procedure Laterality Date  . ATRIAL FIBRILLATION ABLATION  06/30/2017  . ATRIAL FIBRILLATION ABLATION N/A 06/30/2017   Procedure: Atrial Fibrillation Ablation;  Surgeon: Thompson Grayer, MD;  Location: [place] CV LAB;  Service: Cardiovascular;  Laterality: N/A;  . BIOPSY THYROID  1996  . CARDIOVERSION N/A 12/07/2013   Procedure: CARDIOVERSION;  Surgeon: Casandra Doffing, MD;  Location: [facility] ENDOSCOPY;  Service: Cardiovascular;  Laterality: N/A;  . CARDIOVERSION N/A 03/10/2016   Procedure: CARDIOVERSION;  Surgeon: Thayer Headings, MD;  Location: [place];  Service: Cardiovascular;  Laterality: N/A;  . EYE MUSCLE SURGERY Right ~ 1966  . TEE WITHOUT CARDIOVERSION N/A 06/29/2017   Procedure: TRANSESOPHAGEAL ECHOCARDIOGRAM (TEE);  Surgeon: Fay Records, MD;  Location: [place];  Service: Cardiovascular;  Laterality: N/A;  . TONSILLECTOMY  1971    OB History    No data available       Home Medications    Prior to Admission medications   Medication Sig Start Date End Date Taking? Authorizing Provider  clobetasol (TEMOVATE) 0.05 % external solution Apply small quarter size to affected area on scalp BID x 1 week then PRN Patient taking differently: Apply 1 application topically daily as needed (scalp care). Apply small quarter size to affected area on scalp BID x 1 week then PRN 09/07/17  Yes  Ladell Pier, MD  diclofenac sodium (VOLTAREN) 1 % GEL Apply 2 g 4 (four) times daily topically. Patient taking differently: Apply 2 g topically 4 (four) times daily as needed (pain).  10/13/17  Yes Ladell Pier, MD  glipiZIDE (GLUCOTROL) 5 MG tablet Take 1 tablet (5 mg total) by mouth 2 (two) times daily before a meal. 05/12/17  Yes Ladell Pier, MD  Insulin Glargine (LANTUS SOLOSTAR) 100 UNIT/ML Solostar Pen Inject 53 Units into the skin daily at 10 pm. Patient taking differently: Inject 60 Units into the skin daily at 10 pm.  08/31/17  Yes Ladell Pier, MD  JARDIANCE 10 MG TABS tablet TAKE 1 TABLET BY MOUTH DAILY. 11/11/17  Yes Ladell Pier, MD  lisinopril (PRINIVIL,ZESTRIL) 10 MG tablet Take 0.5 tablets (5 mg total) by mouth daily. 11/19/16  Yes Langeland, Dawn T, MD  lovastatin (MEVACOR) 20 MG tablet TAKE 1 TABLET BY MOUTH AT BEDTIME. 11/25/17  Yes Ladell Pier, MD  metFORMIN (GLUCOPHAGE) 1000 MG tablet Take 1 tablet (1,000 mg total) by mouth 2 (two) times daily with a meal. 04/22/17  Yes Jegede, Olugbemiga E, MD  metoprolol tartrate (LOPRESSOR) 25 MG tablet TAKE 1 & 1/2 TABLET BY MOUTH 2 TIMES DAILY Patient taking differently: TAKE 1 & 1/2 TABLET(37.16m) BY MOUTH 2 TIMES DAILY 09/21/17  Yes Nahser, PWonda Cheng MD  omeprazole (PRILOSEC) 40 MG capsule Take 1 capsule (40 mg total) by mouth 2 (two) times daily. 12/04/17  Yes SLadene Artist MD  sucralfate (CARAFATE) 1 g tablet Take 1 tablet (1 g total) by mouth 4 (four) times daily -  with meals and at bedtime. 12/02/17  Yes RQuintella Reichert MD  triamcinolone cream (KENALOG) 0.1 % Apply 1 application topically 2 (two) times daily. Patient taking differently: Apply 1 application topically 2 (two) times daily as needed (rash).  09/07/17  Yes JLadell Pier MD  XARELTO 20 MG TABS tablet TAKE 1 TABLET BY MOUTH DAILY WITH SUPPER. Patient taking differently: TAKE 1 TABLET (247m BY MOUTH DAILY WITH SUPPER. 09/22/17  Yes  Allred, JaJeneen RinksMD  Blood Glucose Monitoring Suppl (ONE TOUCH ULTRA 2) w/Device KIT 1 application by Does not apply route 2 (two) times daily. 11/19/16   LaLottie Mussel, MD  glucose blood (ONE TOUCH TEST STRIPS) test strip Use as instructed 11/19/16   Langeland, Dawn T, MD  Insulin Pen Needle (ULTICARE MICRO PEN NEEDLES) 32G X 4 MM MISC 1 applicator by Does not apply route at bedtime. 11/19/16   LaMaren ReamerMD  Lancets (OGlory RosebushLTRASOFT) lancets Use as instructed 11/19/16   LaMaren ReamerMD    Family History Family History  Adopted: Yes  Problem Relation Age of Onset  . Hypertension Mother   . Hyperlipidemia Mother     Social History Social History   Tobacco Use  . Smoking status: Former Smoker    Packs/day: 1.00  Years: 22.00    Pack years: 22.00    Types: Cigarettes    Last attempt to quit: 12/01/2010    Years since quitting: 7.0  . Smokeless tobacco: Never Used  Substance Use Topics  . Alcohol use: Yes    Comment: 06/30/2017 "glass of wine maybe q other month"  . Drug use: No     Allergies   Codeine   Review of Systems Review of Systems All other systems reviewed and are negative except that which was mentioned in HPI   Physical Exam Updated Vital Signs BP 113/74   Pulse 84   Temp 98 F (36.7 C) (Oral)   Resp 20   Ht 5' 5.5" (1.664 m)   Wt 107.5 kg (237 lb)   SpO2 94%   BMI 38.84 kg/m   Physical Exam  Constitutional: Kelly Lang is oriented to person, place, and time. Kelly Lang appears well-developed and well-nourished. No distress.  Anxious, uncomfortable  HENT:  Head: Normocephalic and atraumatic.  Eyes: Conjunctivae are normal. Pupils are equal, round, and reactive to light.  Neck: Neck supple.  Cardiovascular: Normal rate, regular rhythm and normal heart sounds.  No murmur heard. Pulmonary/Chest: Effort normal and breath sounds normal.  Abdominal: Soft. Bowel sounds are normal. Kelly Lang exhibits no distension. There is tenderness in the epigastric  area and left upper quadrant. There is no rebound and no guarding.  Genitourinary: Rectal exam shows guaiac positive stool.  Musculoskeletal: Kelly Lang exhibits no edema.  Neurological: Kelly Lang is alert and oriented to person, place, and time.  Fluent speech  Skin: Skin is warm and dry.  Psychiatric: Judgment normal.  Anxious  Nursing note and vitals reviewed.    ED Treatments / Results  Labs (all labs ordered are listed, but only abnormal results are displayed) Labs Reviewed  COMPREHENSIVE METABOLIC PANEL - Abnormal; Notable for the following components:      Result Value   CO2 21 (*)    Glucose, Bld 171 (*)    All other components within normal limits  CBC - Abnormal; Notable for the following components:   WBC 11.1 (*)    Hemoglobin 15.7 (*)    HCT 46.8 (*)    All other components within normal limits  URINALYSIS, ROUTINE W REFLEX MICROSCOPIC - Abnormal; Notable for the following components:   Specific Gravity, Urine 1.039 (*)    Glucose, UA >=500 (*)    Ketones, ur 80 (*)    Squamous Epithelial / LPF 0-5 (*)    All other components within normal limits  POC OCCULT BLOOD, ED - Abnormal; Notable for the following components:   Fecal Occult Bld POSITIVE (*)    All other components within normal limits  LIPASE, BLOOD  CBC  I-STAT BETA HCG BLOOD, ED ([facility], [facility], [facility] ONLY)    EKG  EKG Interpretation  Date/Time:  Saturday December 05 2017 18:45:51 EST Ventricular Rate:  72 PR Interval:  172 QRS Duration: 88 QT Interval:  398 QTC Calculation: 435 R Axis:   -66 Text Interpretation:  Normal sinus rhythm Left anterior fascicular block Septal infarct , age undetermined Abnormal ECG No significant change since last tracing Confirmed by Theotis Burrow [id number]) on 12/05/2017 8:44:58 PM       Radiology No results found.  Procedures Procedures (including critical care time)  Medications Ordered in ED Medications  oxyCODONE-acetaminophen (PERCOCET/ROXICET) 5-325 MG per tablet 1 tablet  (1 tablet Oral Given 12/05/17 1857)  ondansetron (ZOFRAN-ODT) disintegrating tablet 4 mg (4 mg Oral Given 12/05/17 1857)  gi cocktail (Maalox,Lidocaine,Donnatal) (30 mLs Oral Given 12/05/17 2217)  ranitidine (ZANTAC) 150 MG/10ML syrup 300 mg (300 mg Oral Given 12/06/17 0044)     Initial Impression / Assessment and Plan / ED Course  I have reviewed the triage vital signs and the nursing notes.  Pertinent labs & imaging results that were available during my care of the patient were reviewed by me and considered in my medical decision making (see chart for details).    Pt on Xarelto w/ ongoing epigastric pain p/w acute worsening of this pain as well as bloody diarrhea today and some vomiting.  Vital signs stable at presentation, epigastric and left upper quadrant tenderness which Kelly Lang states is the same area that Kelly Lang has been having ongoing pain.  Gave GI cocktail with no improvement.  Stool was Hemoccult positive without any hemorrhoids or significant hematochezia on exam.  CMP and lipase reassuring.  Initial hemoglobin 15.7, repeat at 3 hours was 14.6 without any IV fluids.  Although Kelly Lang has had no bloody bowel movements here, I am concerned about hemoglobin drop in the setting of anticoagulant use.  I discussed with Dr. Benson Norway, GI, who states Kelly Lang would be unlikely to have scope tomorrow given schedule but they will be happy to follow along in the event that Kelly Lang decompensates.  Discussed admission with hospitalist, Dr. Maudie Mercury, and patient admitted for further evaluation.  Final Clinical Impressions(s) / ED Diagnoses   Final diagnoses:  None    ED Discharge Orders    None       Little, Wenda Overland, MD 12/06/17 [phone]

## 2017-12-05 NOTE — ED Triage Notes (Signed)
Pt here with recurrent abdominal pain for several weeks.  Multiple negative CT's.  Was seen by GI MD yesterday, who increased her Prilosec dose.  Pt states carafate has been improving pain, but today at 4 pm she experienced acute onset epigastric pain that radiates to back.  Bright blood tinged vomit.

## 2017-12-06 ENCOUNTER — Encounter (HOSPITAL_COMMUNITY): Payer: Self-pay | Admitting: Internal Medicine

## 2017-12-06 ENCOUNTER — Other Ambulatory Visit: Payer: Self-pay

## 2017-12-06 ENCOUNTER — Inpatient Hospital Stay (HOSPITAL_COMMUNITY): Payer: BLUE CROSS/BLUE SHIELD

## 2017-12-06 DIAGNOSIS — I481 Persistent atrial fibrillation: Secondary | ICD-10-CM | POA: Diagnosis present

## 2017-12-06 DIAGNOSIS — E119 Type 2 diabetes mellitus without complications: Secondary | ICD-10-CM | POA: Diagnosis not present

## 2017-12-06 DIAGNOSIS — Z885 Allergy status to narcotic agent status: Secondary | ICD-10-CM | POA: Diagnosis not present

## 2017-12-06 DIAGNOSIS — K922 Gastrointestinal hemorrhage, unspecified: Secondary | ICD-10-CM

## 2017-12-06 DIAGNOSIS — Z794 Long term (current) use of insulin: Secondary | ICD-10-CM | POA: Diagnosis not present

## 2017-12-06 DIAGNOSIS — E7800 Pure hypercholesterolemia, unspecified: Secondary | ICD-10-CM | POA: Diagnosis present

## 2017-12-06 DIAGNOSIS — I10 Essential (primary) hypertension: Secondary | ICD-10-CM | POA: Diagnosis not present

## 2017-12-06 DIAGNOSIS — Z6838 Body mass index (BMI) 38.0-38.9, adult: Secondary | ICD-10-CM | POA: Diagnosis not present

## 2017-12-06 DIAGNOSIS — K5730 Diverticulosis of large intestine without perforation or abscess without bleeding: Secondary | ICD-10-CM | POA: Diagnosis present

## 2017-12-06 DIAGNOSIS — Z8249 Family history of ischemic heart disease and other diseases of the circulatory system: Secondary | ICD-10-CM | POA: Diagnosis not present

## 2017-12-06 DIAGNOSIS — K920 Hematemesis: Secondary | ICD-10-CM

## 2017-12-06 DIAGNOSIS — I5022 Chronic systolic (congestive) heart failure: Secondary | ICD-10-CM | POA: Diagnosis present

## 2017-12-06 DIAGNOSIS — E785 Hyperlipidemia, unspecified: Secondary | ICD-10-CM | POA: Diagnosis present

## 2017-12-06 DIAGNOSIS — E1143 Type 2 diabetes mellitus with diabetic autonomic (poly)neuropathy: Secondary | ICD-10-CM | POA: Diagnosis not present

## 2017-12-06 DIAGNOSIS — G43A0 Cyclical vomiting, not intractable: Secondary | ICD-10-CM | POA: Diagnosis not present

## 2017-12-06 DIAGNOSIS — M1990 Unspecified osteoarthritis, unspecified site: Secondary | ICD-10-CM | POA: Diagnosis present

## 2017-12-06 DIAGNOSIS — R1013 Epigastric pain: Secondary | ICD-10-CM

## 2017-12-06 DIAGNOSIS — K219 Gastro-esophageal reflux disease without esophagitis: Secondary | ICD-10-CM | POA: Diagnosis present

## 2017-12-06 DIAGNOSIS — R112 Nausea with vomiting, unspecified: Secondary | ICD-10-CM | POA: Diagnosis not present

## 2017-12-06 DIAGNOSIS — E039 Hypothyroidism, unspecified: Secondary | ICD-10-CM | POA: Diagnosis present

## 2017-12-06 DIAGNOSIS — K226 Gastro-esophageal laceration-hemorrhage syndrome: Secondary | ICD-10-CM | POA: Diagnosis present

## 2017-12-06 DIAGNOSIS — E872 Acidosis: Secondary | ICD-10-CM | POA: Diagnosis present

## 2017-12-06 DIAGNOSIS — R1111 Vomiting without nausea: Secondary | ICD-10-CM | POA: Diagnosis not present

## 2017-12-06 DIAGNOSIS — Z7901 Long term (current) use of anticoagulants: Secondary | ICD-10-CM | POA: Diagnosis not present

## 2017-12-06 DIAGNOSIS — Z8673 Personal history of transient ischemic attack (TIA), and cerebral infarction without residual deficits: Secondary | ICD-10-CM | POA: Diagnosis not present

## 2017-12-06 DIAGNOSIS — K3184 Gastroparesis: Secondary | ICD-10-CM | POA: Diagnosis not present

## 2017-12-06 DIAGNOSIS — Z87891 Personal history of nicotine dependence: Secondary | ICD-10-CM | POA: Diagnosis not present

## 2017-12-06 DIAGNOSIS — J302 Other seasonal allergic rhinitis: Secondary | ICD-10-CM | POA: Diagnosis present

## 2017-12-06 DIAGNOSIS — R109 Unspecified abdominal pain: Secondary | ICD-10-CM | POA: Diagnosis present

## 2017-12-06 DIAGNOSIS — Z79899 Other long term (current) drug therapy: Secondary | ICD-10-CM | POA: Diagnosis not present

## 2017-12-06 DIAGNOSIS — I110 Hypertensive heart disease with heart failure: Secondary | ICD-10-CM | POA: Diagnosis present

## 2017-12-06 DIAGNOSIS — R1084 Generalized abdominal pain: Secondary | ICD-10-CM | POA: Diagnosis not present

## 2017-12-06 LAB — COMPREHENSIVE METABOLIC PANEL
ALBUMIN: 3.3 g/dL — AB (ref 3.5–5.0)
ALT: 46 U/L (ref 14–54)
ANION GAP: 9 (ref 5–15)
AST: 27 U/L (ref 15–41)
Alkaline Phosphatase: 51 U/L (ref 38–126)
BUN: 9 mg/dL (ref 6–20)
CALCIUM: 8.9 mg/dL (ref 8.9–10.3)
CHLORIDE: 102 mmol/L (ref 101–111)
CO2: 22 mmol/L (ref 22–32)
Creatinine, Ser: 0.63 mg/dL (ref 0.44–1.00)
GFR calc non Af Amer: >60 mL/min (ref >60)
Glucose, Bld: 118 mg/dL — ABNORMAL HIGH (ref 65–99)
POTASSIUM: 3.8 mmol/L (ref 3.5–5.1)
SODIUM: 133 mmol/L — AB (ref 135–145)
Total Bilirubin: 1 mg/dL (ref 0.3–1.2)
Total Protein: 6.9 g/dL (ref 6.5–8.1)

## 2017-12-06 LAB — CBC
HEMATOCRIT: 44.1 % (ref 36.0–46.0)
HEMOGLOBIN: 14.3 g/dL (ref 12.0–15.0)
MCH: 31.1 pg (ref 26.0–34.0)
MCHC: 32.4 g/dL (ref 30.0–36.0)
MCV: 95.9 fL (ref 78.0–100.0)
Platelets: 220 10*3/uL (ref 150–400)
RBC: 4.6 MIL/uL (ref 3.87–5.11)
RDW: 13.2 % (ref 11.5–15.5)
WBC: 7 10*3/uL (ref 4.0–10.5)

## 2017-12-06 LAB — GLUCOSE, CAPILLARY
GLUCOSE-CAPILLARY: 137 mg/dL — AB (ref 65–99)
Glucose-Capillary: 108 mg/dL — ABNORMAL HIGH (ref 65–99)

## 2017-12-06 LAB — CBG MONITORING, ED
GLUCOSE-CAPILLARY: 126 mg/dL — AB (ref 65–99)
Glucose-Capillary: 148 mg/dL — ABNORMAL HIGH (ref 65–99)
Glucose-Capillary: 85 mg/dL (ref 65–99)

## 2017-12-06 LAB — HIV ANTIBODY (ROUTINE TESTING W REFLEX): HIV Screen 4th Generation wRfx: NONREACTIVE

## 2017-12-06 MED ORDER — SODIUM CHLORIDE 0.9 % IV SOLN
INTRAVENOUS | Status: AC
  Administered 2017-12-06: 05:00:00 via INTRAVENOUS

## 2017-12-06 MED ORDER — RANITIDINE HCL 150 MG/10ML PO SYRP
300.0000 mg | ORAL_SOLUTION | Freq: Once | ORAL | Status: AC
  Administered 2017-12-06: 300 mg via ORAL
  Filled 2017-12-06: qty 20

## 2017-12-06 MED ORDER — INSULIN ASPART 100 UNIT/ML ~~LOC~~ SOLN
0.0000 [IU] | SUBCUTANEOUS | Status: DC
Start: 2017-12-06 — End: 2017-12-07
  Administered 2017-12-06: 2 [IU] via SUBCUTANEOUS
  Administered 2017-12-06: 1 [IU] via SUBCUTANEOUS
  Filled 2017-12-06 (×2): qty 1

## 2017-12-06 MED ORDER — PRAVASTATIN SODIUM 40 MG PO TABS: 20.0000 mg | ORAL_TABLET | Freq: Every day | ORAL | Status: DC

## 2017-12-06 MED ORDER — ACETAMINOPHEN 325 MG PO TABS: 650.0000 mg | ORAL_TABLET | Freq: Four times a day (QID) | ORAL | Status: DC | PRN

## 2017-12-06 MED ORDER — SODIUM CHLORIDE 0.9 % IV SOLN
80.0000 mg | Freq: Once | INTRAVENOUS | Status: AC
  Administered 2017-12-06: 80 mg via INTRAVENOUS
  Filled 2017-12-06: qty 80

## 2017-12-06 MED ORDER — MORPHINE SULFATE (PF) 4 MG/ML IV SOLN
1.0000 mg | INTRAVENOUS | Status: DC | PRN
  Administered 2017-12-07: 1 mg via INTRAVENOUS
  Filled 2017-12-06: qty 1

## 2017-12-06 MED ORDER — SODIUM CHLORIDE 0.9 % IV SOLN
8.0000 mg/h | INTRAVENOUS | Status: DC
  Administered 2017-12-06 – 2017-12-07 (×2): 8 mg/h via INTRAVENOUS
  Filled 2017-12-06 (×6): qty 80

## 2017-12-06 MED ORDER — SUCRALFATE 1 G PO TABS
1.0000 g | ORAL_TABLET | Freq: Three times a day (TID) | ORAL | Status: DC
  Administered 2017-12-06 – 2017-12-07 (×7): 1 g via ORAL
  Filled 2017-12-06 (×7): qty 1

## 2017-12-06 MED ORDER — ACETAMINOPHEN 650 MG RE SUPP: 650.0000 mg | Freq: Four times a day (QID) | RECTAL | Status: DC | PRN

## 2017-12-06 MED ORDER — IOPAMIDOL (ISOVUE-300) INJECTION 61%
INTRAVENOUS | Status: AC
  Filled 2017-12-06: qty 30

## 2017-12-06 MED ORDER — ONDANSETRON HCL 4 MG/2ML IJ SOLN: 4.0000 mg | Freq: Four times a day (QID) | INTRAMUSCULAR | Status: DC | PRN

## 2017-12-06 NOTE — Progress Notes (Signed)
  PROGRESS NOTE  Patient admitted earlier this morning. See H&P. Kelly Lang is a 57 yo female with past medical history of hypertension, hyperlipidemia, type 2 diabetes, GERD who presents with complaints of epigastric pain associated with nausea, vomiting with hematemesis, diarrhea 5 episodes in the past 24 hours and bright red blood per rectum after bowel movement.  She was most recently seen by Dr. Claudette Head on 1/4 due to persistent epigastric pain, nausea, vomiting, decreased appetite and weight loss.  At that time, she was increased on her PPI. They had planned for outpatient EGD on 1/7 but she came to the ED with blood in her vomit. CT abd/pelvis negative aside from hepatic steatosis.   GI was consulted by EDP on admission Hold Xarelto Hgb stable, BP stable  IV PPI  NPO  IVF  Stool studies pending  Endoscopy timing per GI    Noralee Stain, DO [facility] [email or web address] Password [email or web address] 12/06/2017, 11:52 AM

## 2017-12-06 NOTE — H&P (Addendum)
TRH H&P   Patient Demographics:    Kelly Lang, is a 57 y.o. female  MRN: [id number]   DOB - 1961-10-19  Admit Date - 12/05/2017  Outpatient Primary MD for the patient is Ladell Pier, MD  Referring MD/NP/PA: Dr. Rex Kras  Outpatient Specialists:  Lucio Edward   Patient coming from: home  Chief Complaint  Patient presents with  . Abdominal Pain      HPI:    Kelly Lang  is a 57 y.o. female, w hypertension, hyperlipidemia, dm2, gerd, who presents w c/o epigastric pain around 4pm associated with n/v, and slight blood with vomittting.  X2.  Pt notes diarrhea (loose stool), x5 over the past 24 hours.  Pt also noted some brbpr when she wipes with toilet paper.  Pt contacted GI on call who sent pt to ED due to hematemesis.   In ED,   Lipase 23,  Na 137, K 4.5, Glucose 171 Bun 9, creatinine 0.63 Ast 34, Alt 51 Wbc 11.1, hgb 15.7, Plt 232 Urinalysis Ketones 80 POC heme positive   Hgb 14.6,   Pt will be admitted for possible GI bleeding , hematemesis, and abdominal pain, and diarrhea.      Review of systems:    In addition to the HPI above,  No Fever-chills, No Headache, No changes with Vision or hearing, No problems swallowing food or Liquids, No Chest pain, Cough or Shortness of Breath,  No Blood in stool or Urine, No dysuria, No new skin rashes or bruises, No new joints pains-aches,  No new weakness, tingling, numbness in any extremity, No recent weight gain or loss, No polyuria, polydypsia or polyphagia, No significant Mental Stressors.  A full 10 point Review of Systems was done, except as stated above, all other Review of Systems were negative.   With Past History of the following :    Past Medical History:  Diagnosis Date  . Arthritis    "all over" (06/30/2017)  . Chicken pox   . Dyslipidemia   . H/O: hypothyroidism    a. thyroid  biopsy 1996 with synthroid. Stopped taking rx and was loss to follow up b. normal thyroid function 10/20/13  . High cholesterol   . Hx of cardiovascular stress test    ETT/Lexiscan Myoview (12/2013):  No ischemia; not gated; low risk.  Marland Kitchen Hypertension   . Obesity   . Persistent atrial fibrillation ([place])    a diagnosed 10/25/2013  . Seasonal allergies   . Stroke [facility]) 2016   "some speech problems since" (06/30/2017)  . 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 2/2 Afib with RVR for unknown duration (EF: 40-45%)  . Thyroid disease   . Type II diabetes mellitus ([place])    a. hg A1c 10, newly diagnosed (10/26/13)      Past Surgical History:  Procedure Laterality Date  . ATRIAL FIBRILLATION ABLATION  06/30/2017  .  ATRIAL FIBRILLATION ABLATION N/A 06/30/2017   Procedure: Atrial Fibrillation Ablation;  Surgeon: Thompson Grayer, MD;  Location: [place] CV LAB;  Service: Cardiovascular;  Laterality: N/A;  . BIOPSY THYROID  1996  . CARDIOVERSION N/A 12/07/2013   Procedure: CARDIOVERSION;  Surgeon: Casandra Doffing, MD;  Location[facility] ENDOSCOPY;  Service: Cardiovascular;  Laterality: N/A;  . CARDIOVERSION N/A 03/10/2016   Procedure: CARDIOVERSION;  Surgeon: Thayer Headings, MD;  Location: [place];  Service: Cardiovascular;  Laterality: N/A;  . EYE MUSCLE SURGERY Right ~ 1966  . TEE WITHOUT CARDIOVERSION N/A 06/29/2017   Procedure: TRANSESOPHAGEAL ECHOCARDIOGRAM (TEE);  Surgeon: Fay Records, MD;  Location: [facility] ENDOSCOPY;  Service: Cardiovascular;  Laterality: N/A;  . TONSILLECTOMY  1971      Social History:     Social History   Tobacco Use  . Smoking status: Former Smoker    Packs/day: 1.00    Years: 22.00    Pack years: 22.00    Types: Cigarettes    Last attempt to quit: 12/01/2010    Years since quitting: 7.0  . Smokeless tobacco: Never Used  Substance Use Topics  . Alcohol use: Yes    Comment: 06/30/2017 "glass of wine maybe q other month"     Lives - at home w  friend  Mobility - walks by self   Family History :     Family History  Adopted: Yes  Problem Relation Age of Onset  . Hypertension Mother   . Hyperlipidemia Mother       Home Medications:   Prior to Admission medications   Medication Sig Start Date End Date Taking? Authorizing Provider  clobetasol (TEMOVATE) 0.05 % external solution Apply small quarter size to affected area on scalp BID x 1 week then PRN Patient taking differently: Apply 1 application topically daily as needed (scalp care). Apply small quarter size to affected area on scalp BID x 1 week then PRN 09/07/17  Yes Ladell Pier, MD  diclofenac sodium (VOLTAREN) 1 % GEL Apply 2 g 4 (four) times daily topically. Patient taking differently: Apply 2 g topically 4 (four) times daily as needed (pain).  10/13/17  Yes Ladell Pier, MD  glipiZIDE (GLUCOTROL) 5 MG tablet Take 1 tablet (5 mg total) by mouth 2 (two) times daily before a meal. 05/12/17  Yes Ladell Pier, MD  Insulin Glargine (LANTUS SOLOSTAR) 100 UNIT/ML Solostar Pen Inject 53 Units into the skin daily at 10 pm. Patient taking differently: Inject 60 Units into the skin daily at 10 pm.  08/31/17  Yes Ladell Pier, MD  JARDIANCE 10 MG TABS tablet TAKE 1 TABLET BY MOUTH DAILY. 11/11/17  Yes Ladell Pier, MD  lisinopril (PRINIVIL,ZESTRIL) 10 MG tablet Take 0.5 tablets (5 mg total) by mouth daily. 11/19/16  Yes Langeland, Dawn T, MD  lovastatin (MEVACOR) 20 MG tablet TAKE 1 TABLET BY MOUTH AT BEDTIME. 11/25/17  Yes Ladell Pier, MD  metFORMIN (GLUCOPHAGE) 1000 MG tablet Take 1 tablet (1,000 mg total) by mouth 2 (two) times daily with a meal. 04/22/17  Yes Jegede, Olugbemiga E, MD  metoprolol tartrate (LOPRESSOR) 25 MG tablet TAKE 1 & 1/2 TABLET BY MOUTH 2 TIMES DAILY Patient taking differently: TAKE 1 & 1/2 TABLET(37.44m) BY MOUTH 2 TIMES DAILY 09/21/17  Yes Nahser, PWonda Cheng MD  omeprazole (PRILOSEC) 40 MG capsule Take 1 capsule (40 mg  total) by mouth 2 (two) times daily. 12/04/17  Yes SLadene Artist MD  sucralfate (CARAFATE) 1 g  tablet Take 1 tablet (1 g total) by mouth 4 (four) times daily -  with meals and at bedtime. 12/02/17  Yes Quintella Reichert, MD  triamcinolone cream (KENALOG) 0.1 % Apply 1 application topically 2 (two) times daily. Patient taking differently: Apply 1 application topically 2 (two) times daily as needed (rash).  09/07/17  Yes Ladell Pier, MD  XARELTO 20 MG TABS tablet TAKE 1 TABLET BY MOUTH DAILY WITH SUPPER. Patient taking differently: TAKE 1 TABLET (75m) BY MOUTH DAILY WITH SUPPER. 09/22/17  Yes Allred, JJeneen Rinks MD  Blood Glucose Monitoring Suppl (ONE TOUCH ULTRA 2) w/Device KIT 1 application by Does not apply route 2 (two) times daily. 11/19/16   LLottie MusselT, MD  glucose blood (ONE TOUCH TEST STRIPS) test strip Use as instructed 11/19/16   Langeland, Dawn T, MD  Insulin Pen Needle (ULTICARE MICRO PEN NEEDLES) 32G X 4 MM MISC 1 applicator by Does not apply route at bedtime. 11/19/16   LMaren Reamer MD  Lancets ([facility]) lancets Use as instructed 11/19/16   LMaren Reamer MD     Allergies:     Allergies  Allergen Reactions  . Codeine Itching and Rash     Physical Exam:   Vitals  Blood pressure 113/74, pulse 84, temperature 98 F (36.7 C), temperature source Oral, resp. rate 20, height 5' 5.5" (1.664 m), weight 107.5 kg (237 lb), SpO2 94 %.   1. General  lying in bed in NAD,    2. Normal affect and insight, Not Suicidal or Homicidal, Awake Alert, Oriented X 3.  3. No F.N deficits, ALL C.Nerves Intact, Strength 5/5 all 4 extremities, Sensation intact all 4 extremities, Plantars down going.  4. Ears and Eyes appear Normal, Conjunctivae clear, PERRLA. Moist Oral Mucosa.  5. Supple Neck, No JVD, No cervical lymphadenopathy appriciated, No Carotid Bruits.  6. Symmetrical Chest wall movement, Good air movement bilaterally, CTAB.  7. RRR, No Gallops, Rubs or  Murmurs, No Parasternal Heave.  8. Positive Bowel Sounds, Abdomen Soft, No tenderness, No organomegaly appriciated,No rebound -guarding or rigidity.  9.  No Cyanosis, Normal Skin Turgor, No Skin Rash or Bruise.  10. Good muscle tone,  joints appear normal , no effusions, Normal ROM.  11. No Palpable Lymph Nodes in Neck or Axillae     Data Review:    CBC Recent Labs  Lab 12/02/17 1125 12/05/17 1839 12/05/17 2225  WBC 7.4 11.1* 9.2  HGB 15.9* 15.7* 14.6  HCT 47.1* 46.8* 43.9  PLT 231 232 236  MCV 95.5 95.3 96.3  MCH 32.3 32.0 32.0  MCHC 33.8 33.5 33.3  RDW 13.1 13.0 13.1   ------------------------------------------------------------------------------------------------------------------  Chemistries  Recent Labs  Lab 12/02/17 1125 12/05/17 1839  NA 134* 137  K 4.2 4.5  CL 101 105  CO2 22 21*  GLUCOSE 142* 171*  BUN 8 9  CREATININE 0.64 0.63  CALCIUM 9.0 9.3  AST 21 34  ALT 29 51  ALKPHOS 60 60  BILITOT 0.8 0.9   ------------------------------------------------------------------------------------------------------------------ estimated creatinine clearance is 98.9 mL/min (by C-G formula based on SCr of 0.63 mg/dL). ------------------------------------------------------------------------------------------------------------------ No results for input(s): TSH, T4TOTAL, T3FREE, THYROIDAB in the last 72 hours.  Invalid input(s): FREET3  Coagulation profile No results for input(s): INR, PROTIME in the last 168 hours. ------------------------------------------------------------------------------------------------------------------- No results for input(s): DDIMER in the last 72 hours. -------------------------------------------------------------------------------------------------------------------  Cardiac Enzymes No results for input(s): CKMB, TROPONINI, MYOGLOBIN in the last 168 hours.  Invalid input(s):  CK ------------------------------------------------------------------------------------------------------------------  Component Value Date/Time   BNP 323.7 (H) 09/14/2015 1524     ---------------------------------------------------------------------------------------------------------------  Urinalysis    Component Value Date/Time   COLORURINE YELLOW 12/05/2017 2153   APPEARANCEUR CLEAR 12/05/2017 2153   LABSPEC 1.039 (H) 12/05/2017 2153   PHURINE 5.0 12/05/2017 2153   GLUCOSEU >=500 (A) 12/05/2017 2153   HGBUR NEGATIVE 12/05/2017 2153   BILIRUBINUR NEGATIVE 12/05/2017 2153   KETONESUR 80 (A) 12/05/2017 2153   PROTEINUR NEGATIVE 12/05/2017 2153   UROBILINOGEN 0.2 09/14/2015 1345   NITRITE NEGATIVE 12/05/2017 2153   LEUKOCYTESUR NEGATIVE 12/05/2017 2153    ----------------------------------------------------------------------------------------------------------------   Imaging Results:    No results found.    Assessment & Plan:    Active Problems:   Abdominal pain    Epigastric pain  ddx PUD, gastroparesis, diverticulitis NPO Start Protonix 44m iv x1 then 861m/ hour Check CT scan abd/ pelvis GI consulted by ED, appreciate input  STOP Eliquis for now  N/v Zofran Protonix  Diarrhea Stool studies, fecal leukoctytes, C. Diff  Heme positive stool Check cbc in am  Non AG acidosis ? Diarrhea STOP metformin  Hydrate with ns iv , consider D5w with 3 amps bicarb if not improving  DM2 Check hga1c STOP Jardiance STOP Metfromin STOP Victoza STOP Lantus fsbs q4h, ISS   Hyperlipidemia STOP Lovastatin  Hypertension Hold LIsinopril for now as well as metoprolol     DVT Prophylaxis - SCDs  AM Labs Ordered, also please review Full Orders  Family Communication: Admission, patients condition and plan of care including tests being ordered have been discussed with the patient who indicate understanding and agree with the plan and Code Status.  Code  Status FULL CODE  Likely DC to  home  Condition GUARDED    Consults called: gastroenterology by ED,   Admission status: inpatient  Time spent in minutes : 45    JaJani Gravel.D on 12/06/2017 at 2:16 AM  Between 7am to 7pm - Pager - [phone] 7pm go to [email or web address] - password [email or web address] - Office  [phone]

## 2017-12-06 NOTE — Consult Note (Signed)
Consult Note for [facility]  Reason for Consult: Nausea, vomiting, epigastric pain, and hematemesis Referring Physician: [facility]  [name] HPI: This is a 57 year old female with a PMH of HTN, CVA, DM, hyperlipidemia, and obesity who is admitted for nausea, vomiting, and hematemesis.  The patient was evaluated in the ER just before Christmas and on 1//01/2018 for similar symptoms without the hematemesis.  Her symptoms started in early December.  She takes Xarelto for her prior CVA.  The patient was last evaluated by Dr. Russella Dar on 12/04/2017 and she was scheduled to undergo an EGD on 12/07/2017, but she started to have an acute worsening of her symptoms yesterday afternoon.  During this episode she started to have some mild hematemesis.  Additionally she notes problems with loose stools over the past day.  Work up in the ER on these multiple occasion were negative for any overt etiology.  She is heme positive and there is a minimal drop in her HGB.  Currently she does not have pain as the pain is typically incited with PO intake.  Past Medical History:  Diagnosis Date  . Arthritis    "all over" (06/30/2017)  . Chicken pox   . Dyslipidemia   . H/O: hypothyroidism    a. thyroid biopsy 1996 with synthroid. Stopped taking rx and was loss to follow up b. normal thyroid function 10/20/13  . High cholesterol   . Hx of cardiovascular stress test    ETT/Lexiscan Myoview (12/2013):  No ischemia; not gated; low risk.  Marland Kitchen Hypertension   . Obesity   . Persistent atrial fibrillation (HCC)    a diagnosed 10/25/2013  . Seasonal allergies   . Stroke [facility]) 2016   "some speech problems since" (06/30/2017)  . 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 2/2 Afib with RVR for unknown duration (EF: 40-45%)  . Thyroid disease   . Type II diabetes mellitus (HCC)    a. hg A1c 10, newly diagnosed (10/26/13)    Past Surgical History:  Procedure Laterality Date  . ATRIAL FIBRILLATION ABLATION   06/30/2017  . ATRIAL FIBRILLATION ABLATION N/A 06/30/2017   Procedure: Atrial Fibrillation Ablation;  Surgeon: Hillis Range, MD;  Location: [facility] INVASIVE CV LAB;  Service: Cardiovascular;  Laterality: N/A;  . BIOPSY THYROID  1996  . CARDIOVERSION N/A 12/07/2013   Procedure: CARDIOVERSION;  Surgeon: Everette Rank, MD;  Location: [facility] ENDOSCOPY;  Service: Cardiovascular;  Laterality: N/A;  . CARDIOVERSION N/A 03/10/2016   Procedure: CARDIOVERSION;  Surgeon: Vesta Mixer, MD;  Location: [facility] ENDOSCOPY;  Service: Cardiovascular;  Laterality: N/A;  . EYE MUSCLE SURGERY Right ~ 1966  . TEE WITHOUT CARDIOVERSION N/A 06/29/2017   Procedure: TRANSESOPHAGEAL ECHOCARDIOGRAM (TEE);  Surgeon: Pricilla Riffle, MD;  Location: [facility] ENDOSCOPY;  Service: Cardiovascular;  Laterality: N/A;  . TONSILLECTOMY  1971    Family History  Adopted: Yes  Problem Relation Age of Onset  . Hypertension Mother   . Hyperlipidemia Mother     Social History:  reports that she quit smoking about 7 years ago. Her smoking use included cigarettes. She has a 22.00 pack-year smoking history. she has never used smokeless tobacco. She reports that she drinks alcohol. She reports that she does not use drugs.  Allergies:  Allergies  Allergen Reactions  . Codeine Itching and Rash    Medications:  Scheduled: . insulin aspart  0-9 Units Subcutaneous Q4H  . iopamidol      . pravastatin  20 mg Oral q1800  .  sucralfate  1 g Oral TID WC & HS   Continuous: . sodium chloride 100 mL/hr at 12/06/17 0443  . pantoprozole (PROTONIX) infusion 8 mg/hr (12/06/17 0458)    Results for orders placed or performed during the hospital encounter of 12/05/17 (from the past 24 hour(s))  Lipase, blood     Status: None   Collection Time: 12/05/17  6:39 PM  Result Value Ref Range   Lipase 23 11 - 51 U/L  Comprehensive metabolic panel     Status: Abnormal   Collection Time: 12/05/17  6:39 PM  Result Value Ref Range   Sodium 137 135 - 145 mmol/L    Potassium 4.5 3.5 - 5.1 mmol/L   Chloride 105 101 - 111 mmol/L   CO2 21 (L) 22 - 32 mmol/L   Glucose, Bld 171 (H) 65 - 99 mg/dL   BUN 9 6 - 20 mg/dL   Creatinine, Ser 1.61 0.44 - 1.00 mg/dL   Calcium 9.3 8.9 - 09.6 mg/dL   Total Protein 7.9 6.5 - 8.1 g/dL   Albumin 3.8 3.5 - 5.0 g/dL   AST 34 15 - 41 U/L   ALT 51 14 - 54 U/L   Alkaline Phosphatase 60 38 - 126 U/L   Total Bilirubin 0.9 0.3 - 1.2 mg/dL   GFR calc non Af Amer >60 >60 mL/min   GFR calc Af Amer >60 >60 mL/min   Anion gap 11 5 - 15  CBC     Status: Abnormal   Collection Time: 12/05/17  6:39 PM  Result Value Ref Range   WBC 11.1 (H) 4.0 - 10.5 K/uL   RBC 4.91 3.87 - 5.11 MIL/uL   Hemoglobin 15.7 (H) 12.0 - 15.0 g/dL   HCT 04.5 (H) 40.9 - 81.1 %   MCV 95.3 78.0 - 100.0 fL   MCH 32.0 26.0 - 34.0 pg   MCHC 33.5 30.0 - 36.0 g/dL   RDW 91.4 78.2 - 95.6 %   Platelets 232 150 - 400 K/uL  I-Stat beta hCG blood, ED     Status: None   Collection Time: 12/05/17  7:01 PM  Result Value Ref Range   I-stat hCG, quantitative <5.0 <5 mIU/mL   Comment 3          Urinalysis, Routine w reflex microscopic     Status: Abnormal   Collection Time: 12/05/17  9:53 PM  Result Value Ref Range   Color, Urine YELLOW YELLOW   APPearance CLEAR CLEAR   Specific Gravity, Urine 1.039 (H) 1.005 - 1.030   pH 5.0 5.0 - 8.0   Glucose, UA >=500 (A) NEGATIVE mg/dL   Hgb urine dipstick NEGATIVE NEGATIVE   Bilirubin Urine NEGATIVE NEGATIVE   Ketones, ur 80 (A) NEGATIVE mg/dL   Protein, ur NEGATIVE NEGATIVE mg/dL   Nitrite NEGATIVE NEGATIVE   Leukocytes, UA NEGATIVE NEGATIVE   RBC / HPF 0-5 0 - 5 RBC/hpf   WBC, UA 0-5 0 - 5 WBC/hpf   Bacteria, UA NONE SEEN NONE SEEN   Squamous Epithelial / LPF 0-5 (A) NONE SEEN   Mucus PRESENT   POC occult blood, ED Provider will collect     Status: Abnormal   Collection Time: 12/05/17  9:56 PM  Result Value Ref Range   Fecal Occult Bld POSITIVE (A) NEGATIVE  CBC     Status: None   Collection Time: 12/05/17  10:25 PM  Result Value Ref Range   WBC 9.2 4.0 - 10.5 K/uL   RBC 4.56  3.87 - 5.11 MIL/uL   Hemoglobin 14.6 12.0 - 15.0 g/dL   HCT 47.8 29.5 - 62.1 %   MCV 96.3 78.0 - 100.0 fL   MCH 32.0 26.0 - 34.0 pg   MCHC 33.3 30.0 - 36.0 g/dL   RDW 30.8 65.7 - 84.6 %   Platelets 236 150 - 400 K/uL  CBG monitoring, ED     Status: Abnormal   Collection Time: 12/06/17  4:19 AM  Result Value Ref Range   Glucose-Capillary 126 (H) 65 - 99 mg/dL  Comprehensive metabolic panel     Status: Abnormal   Collection Time: 12/06/17  4:28 AM  Result Value Ref Range   Sodium 133 (L) 135 - 145 mmol/L   Potassium 3.8 3.5 - 5.1 mmol/L   Chloride 102 101 - 111 mmol/L   CO2 22 22 - 32 mmol/L   Glucose, Bld 118 (H) 65 - 99 mg/dL   BUN 9 6 - 20 mg/dL   Creatinine, Ser 9.62 0.44 - 1.00 mg/dL   Calcium 8.9 8.9 - 95.2 mg/dL   Total Protein 6.9 6.5 - 8.1 g/dL   Albumin 3.3 (L) 3.5 - 5.0 g/dL   AST 27 15 - 41 U/L   ALT 46 14 - 54 U/L   Alkaline Phosphatase 51 38 - 126 U/L   Total Bilirubin 1.0 0.3 - 1.2 mg/dL   GFR calc non Af Amer >60 >60 mL/min   GFR calc Af Amer >60 >60 mL/min   Anion gap 9 5 - 15  CBC     Status: None   Collection Time: 12/06/17  4:28 AM  Result Value Ref Range   WBC 7.0 4.0 - 10.5 K/uL   RBC 4.60 3.87 - 5.11 MIL/uL   Hemoglobin 14.3 12.0 - 15.0 g/dL   HCT 84.1 32.4 - 40.1 %   MCV 95.9 78.0 - 100.0 fL   MCH 31.1 26.0 - 34.0 pg   MCHC 32.4 30.0 - 36.0 g/dL   RDW 02.7 25.3 - 66.4 %   Platelets 220 150 - 400 K/uL  CBG monitoring, ED     Status: Abnormal   Collection Time: 12/06/17  8:00 AM  Result Value Ref Range   Glucose-Capillary 148 (H) 65 - 99 mg/dL     Ct Abdomen Pelvis Wo Contrast  Result Date: 12/06/2017 CLINICAL DATA:  57 year old female with abdominal pain, nausea and vomiting for 2 weeks. EXAM: CT ABDOMEN AND PELVIS WITHOUT CONTRAST TECHNIQUE: Multidetector CT imaging of the abdomen and pelvis was performed following the standard protocol without IV contrast. COMPARISON:   12/02/2017 CT and prior studies FINDINGS: Please note that parenchymal abnormalities may be missed without intravenous contrast. Lower chest: No acute abnormality. Mild left basilar scar again noted. Hepatobiliary: Mild hepatic steatosis identified. No focal hepatic abnormalities are present. The gallbladder is unremarkable. No biliary dilatation. Pancreas: Unremarkable Spleen: Unremarkable Adrenals/Urinary Tract: The kidneys, adrenal glands and bladder are unremarkable. Stomach/Bowel: Stomach is within normal limits. Appendix appears normal. No evidence of bowel wall thickening, distention, or inflammatory changes. A few scattered colonic diverticula are identified without evidence of diverticulitis. Vascular/Lymphatic: Aortic atherosclerosis. No enlarged abdominal or pelvic lymph nodes. Reproductive: Uterus and bilateral adnexa are unremarkable. Other: No ascites, focal collection, pneumoperitoneum or abdominal wall hernia. Musculoskeletal: No acute or significant osseous findings. IMPRESSION: 1. No acute abnormalities or findings to suggest a cause for this patient's abdominal pain. 2. Hepatic steatosis 3.  Aortic Atherosclerosis (ICD10-I70.0). Electronically Signed   By: Henrietta Hoover.D.  On: 12/06/2017 07:38    ROS:  As stated above in the HPI otherwise negative.  Blood pressure 130/68, pulse 75, temperature 98 F (36.7 C), temperature source Oral, resp. rate 20, height 5' 5.5" (1.664 m), weight 107.5 kg (237 lb), SpO2 93 %.    PE: Gen: NAD, Alert and Oriented HEENT:  [place]/AT, EOMI Neck: Supple, no LAD Lungs: CTA Bilaterally CV: RRR without M/G/R ABM: Soft, NTND, +BS Ext: No C/C/E  Assessment/Plan: 1) Hematemesis. 2) Nausea/Vomiting. 3) Epigastric abdominal pain.   Her hematemesis is most likely a Mallory-Weiss tear exacerbated with Xarelto.  Currently she is well.  Her HGB is stable.  I will advance her diet to a clear liquid diet as she desires to drink something.  Unknown if this will  induce her nausea/vomiting.   Plan: 1) EGD with Dr. Marina Goodell. 2) Trial of clear liquid diet. Demaris Leavell D 12/06/2017, 11:12 AM

## 2017-12-06 NOTE — H&P (View-Only) (Signed)
Consult Note for [facility]  Reason for Consult: Nausea, vomiting, epigastric pain, and hematemesis Referring Physician: [facility]  Kelly Lang HPI: This is a 57 year old female with a PMH of HTN, CVA, DM, hyperlipidemia, and obesity who is admitted for nausea, vomiting, and hematemesis.  The patient was evaluated in the ER just before Christmas and on 1//01/2018 for similar symptoms without the hematemesis.  Her symptoms started in early December.  She takes Xarelto for her prior CVA.  The patient was last evaluated by Dr. Stark on 12/04/2017 and she was scheduled to undergo an EGD on 12/07/2017, but she started to have an acute worsening of her symptoms yesterday afternoon.  During this episode she started to have some mild hematemesis.  Additionally she notes problems with loose stools over the past day.  Work up in the ER on these multiple occasion were negative for any overt etiology.  She is heme positive and there is a minimal drop in her HGB.  Currently she does not have pain as the pain is typically incited with PO intake.  Past Medical History:  Diagnosis Date  . Arthritis    "all over" (06/30/2017)  . Chicken pox   . Dyslipidemia   . H/O: hypothyroidism    a. thyroid biopsy 1996 with synthroid. Stopped taking rx and was loss to follow up b. normal thyroid function 10/20/13  . High cholesterol   . Hx of cardiovascular stress test    ETT/Lexiscan Myoview (12/2013):  No ischemia; not gated; low risk.  . Hypertension   . Obesity   . Persistent atrial fibrillation (HCC)    a diagnosed 10/25/2013  . Seasonal allergies   . Stroke (HCC) 2016   "some speech problems since" (06/30/2017)  . 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 2/2 Afib with RVR for unknown duration (EF: 40-45%)  . Thyroid disease   . Type II diabetes mellitus (HCC)    a. hg A1c 10, newly diagnosed (10/26/13)    Past Surgical History:  Procedure Laterality Date  . ATRIAL FIBRILLATION ABLATION   06/30/2017  . ATRIAL FIBRILLATION ABLATION N/A 06/30/2017   Procedure: Atrial Fibrillation Ablation;  Surgeon: Allred, James, MD;  Location: [facility];  Service: Cardiovascular;  Laterality: N/A;  . BIOPSY THYROID  1996  . CARDIOVERSION N/A 12/07/2013   Procedure: CARDIOVERSION;  Surgeon: Jay Varanasi, MD;  Location: [facility];  Service: Cardiovascular;  Laterality: N/A;  . CARDIOVERSION N/A 03/10/2016   Procedure: CARDIOVERSION;  Surgeon: Philip J Nahser, MD;  Location: [facility];  Service: Cardiovascular;  Laterality: N/A;  . EYE MUSCLE SURGERY Right ~ 1966  . TEE WITHOUT CARDIOVERSION N/A 06/29/2017   Procedure: TRANSESOPHAGEAL ECHOCARDIOGRAM (TEE);  Surgeon: Ross, Paula V, MD;  Location: [facility];  Service: Cardiovascular;  Laterality: N/A;  . TONSILLECTOMY  1971    Family History  Adopted: Yes  Problem Relation Age of Onset  . Hypertension Mother   . Hyperlipidemia Mother     Social History:  reports that she quit smoking about 7 years ago. Her smoking use included cigarettes. She has a 22.00 pack-year smoking history. she has never used smokeless tobacco. She reports that she drinks alcohol. She reports that she does not use drugs.  Allergies:  Allergies  Allergen Reactions  . Codeine Itching and Rash    Medications:  Scheduled: . insulin aspart  0-9 Units Subcutaneous Q4H  . iopamidol      . pravastatin  20 mg Oral q1800  .   sucralfate  1 g Oral TID WC & HS   Continuous: . sodium chloride 100 mL/hr at 12/06/17 0443  . pantoprozole (PROTONIX) infusion 8 mg/hr (12/06/17 0458)    Results for orders placed or performed during the hospital encounter of 12/05/17 (from the past 24 hour(s))  Lipase, blood     Status: None   Collection Time: 12/05/17  6:39 PM  Result Value Ref Range   Lipase 23 11 - 51 U/L  Comprehensive metabolic panel     Status: Abnormal   Collection Time: 12/05/17  6:39 PM  Result Value Ref Range   Sodium 137 135 - 145 mmol/L    Potassium 4.5 3.5 - 5.1 mmol/L   Chloride 105 101 - 111 mmol/L   CO2 21 (L) 22 - 32 mmol/L   Glucose, Bld 171 (H) 65 - 99 mg/dL   BUN 9 6 - 20 mg/dL   Creatinine, Ser 0.63 0.44 - 1.00 mg/dL   Calcium 9.3 8.9 - 10.3 mg/dL   Total Protein 7.9 6.5 - 8.1 g/dL   Albumin 3.8 3.5 - 5.0 g/dL   AST 34 15 - 41 U/L   ALT 51 14 - 54 U/L   Alkaline Phosphatase 60 38 - 126 U/L   Total Bilirubin 0.9 0.3 - 1.2 mg/dL   GFR calc non Af Amer >60 >60 mL/min   GFR calc Af Amer >60 >60 mL/min   Anion gap 11 5 - 15  CBC     Status: Abnormal   Collection Time: 12/05/17  6:39 PM  Result Value Ref Range   WBC 11.1 (H) 4.0 - 10.5 K/uL   RBC 4.91 3.87 - 5.11 MIL/uL   Hemoglobin 15.7 (H) 12.0 - 15.0 g/dL   HCT 46.8 (H) 36.0 - 46.0 %   MCV 95.3 78.0 - 100.0 fL   MCH 32.0 26.0 - 34.0 pg   MCHC 33.5 30.0 - 36.0 g/dL   RDW 13.0 11.5 - 15.5 %   Platelets 232 150 - 400 K/uL  I-Stat beta hCG blood, ED     Status: None   Collection Time: 12/05/17  7:01 PM  Result Value Ref Range   I-stat hCG, quantitative <5.0 <5 mIU/mL   Comment 3          Urinalysis, Routine w reflex microscopic     Status: Abnormal   Collection Time: 12/05/17  9:53 PM  Result Value Ref Range   Color, Urine YELLOW YELLOW   APPearance CLEAR CLEAR   Specific Gravity, Urine 1.039 (H) 1.005 - 1.030   pH 5.0 5.0 - 8.0   Glucose, UA >=500 (A) NEGATIVE mg/dL   Hgb urine dipstick NEGATIVE NEGATIVE   Bilirubin Urine NEGATIVE NEGATIVE   Ketones, ur 80 (A) NEGATIVE mg/dL   Protein, ur NEGATIVE NEGATIVE mg/dL   Nitrite NEGATIVE NEGATIVE   Leukocytes, UA NEGATIVE NEGATIVE   RBC / HPF 0-5 0 - 5 RBC/hpf   WBC, UA 0-5 0 - 5 WBC/hpf   Bacteria, UA NONE SEEN NONE SEEN   Squamous Epithelial / LPF 0-5 (A) NONE SEEN   Mucus PRESENT   POC occult blood, ED Provider will collect     Status: Abnormal   Collection Time: 12/05/17  9:56 PM  Result Value Ref Range   Fecal Occult Bld POSITIVE (A) NEGATIVE  CBC     Status: None   Collection Time: 12/05/17  10:25 PM  Result Value Ref Range   WBC 9.2 4.0 - 10.5 K/uL   RBC 4.56   3.87 - 5.11 MIL/uL   Hemoglobin 14.6 12.0 - 15.0 g/dL   HCT 43.9 36.0 - 46.0 %   MCV 96.3 78.0 - 100.0 fL   MCH 32.0 26.0 - 34.0 pg   MCHC 33.3 30.0 - 36.0 g/dL   RDW 13.1 11.5 - 15.5 %   Platelets 236 150 - 400 K/uL  CBG monitoring, ED     Status: Abnormal   Collection Time: 12/06/17  4:19 AM  Result Value Ref Range   Glucose-Capillary 126 (H) 65 - 99 mg/dL  Comprehensive metabolic panel     Status: Abnormal   Collection Time: 12/06/17  4:28 AM  Result Value Ref Range   Sodium 133 (L) 135 - 145 mmol/L   Potassium 3.8 3.5 - 5.1 mmol/L   Chloride 102 101 - 111 mmol/L   CO2 22 22 - 32 mmol/L   Glucose, Bld 118 (H) 65 - 99 mg/dL   BUN 9 6 - 20 mg/dL   Creatinine, Ser 0.63 0.44 - 1.00 mg/dL   Calcium 8.9 8.9 - 10.3 mg/dL   Total Protein 6.9 6.5 - 8.1 g/dL   Albumin 3.3 (L) 3.5 - 5.0 g/dL   AST 27 15 - 41 U/L   ALT 46 14 - 54 U/L   Alkaline Phosphatase 51 38 - 126 U/L   Total Bilirubin 1.0 0.3 - 1.2 mg/dL   GFR calc non Af Amer >60 >60 mL/min   GFR calc Af Amer >60 >60 mL/min   Anion gap 9 5 - 15  CBC     Status: None   Collection Time: 12/06/17  4:28 AM  Result Value Ref Range   WBC 7.0 4.0 - 10.5 K/uL   RBC 4.60 3.87 - 5.11 MIL/uL   Hemoglobin 14.3 12.0 - 15.0 g/dL   HCT 44.1 36.0 - 46.0 %   MCV 95.9 78.0 - 100.0 fL   MCH 31.1 26.0 - 34.0 pg   MCHC 32.4 30.0 - 36.0 g/dL   RDW 13.2 11.5 - 15.5 %   Platelets 220 150 - 400 K/uL  CBG monitoring, ED     Status: Abnormal   Collection Time: 12/06/17  8:00 AM  Result Value Ref Range   Glucose-Capillary 148 (H) 65 - 99 mg/dL     Ct Abdomen Pelvis Wo Contrast  Result Date: 12/06/2017 CLINICAL DATA:  56-year-old female with abdominal pain, nausea and vomiting for 2 weeks. EXAM: CT ABDOMEN AND PELVIS WITHOUT CONTRAST TECHNIQUE: Multidetector CT imaging of the abdomen and pelvis was performed following the standard protocol without IV contrast. COMPARISON:   12/02/2017 CT and prior studies FINDINGS: Please note that parenchymal abnormalities may be missed without intravenous contrast. Lower chest: No acute abnormality. Mild left basilar scar again noted. Hepatobiliary: Mild hepatic steatosis identified. No focal hepatic abnormalities are present. The gallbladder is unremarkable. No biliary dilatation. Pancreas: Unremarkable Spleen: Unremarkable Adrenals/Urinary Tract: The kidneys, adrenal glands and bladder are unremarkable. Stomach/Bowel: Stomach is within normal limits. Appendix appears normal. No evidence of bowel wall thickening, distention, or inflammatory changes. A few scattered colonic diverticula are identified without evidence of diverticulitis. Vascular/Lymphatic: Aortic atherosclerosis. No enlarged abdominal or pelvic lymph nodes. Reproductive: Uterus and bilateral adnexa are unremarkable. Other: No ascites, focal collection, pneumoperitoneum or abdominal wall hernia. Musculoskeletal: No acute or significant osseous findings. IMPRESSION: 1. No acute abnormalities or findings to suggest a cause for this patient's abdominal pain. 2. Hepatic steatosis 3.  Aortic Atherosclerosis (ICD10-I70.0). Electronically Signed   By: Jeffrey  Hu M.D.     On: 12/06/2017 07:38    ROS:  As stated above in the HPI otherwise negative.  Blood pressure 130/68, pulse 75, temperature 98 F (36.7 C), temperature source Oral, resp. rate 20, height 5' 5.5" (1.664 m), weight 107.5 kg (237 lb), SpO2 93 %.    PE: Gen: NAD, Alert and Oriented HEENT:  [place]/AT, EOMI Neck: Supple, no LAD Lungs: CTA Bilaterally CV: RRR without M/G/R ABM: Soft, NTND, +BS Ext: No C/C/E  Assessment/Plan: 1) Hematemesis. 2) Nausea/Vomiting. 3) Epigastric abdominal pain.   Her hematemesis is most likely a Mallory-Weiss tear exacerbated with Xarelto.  Currently she is well.  Her HGB is stable.  I will advance her diet to a clear liquid diet as she desires to drink something.  Unknown if this will  induce her nausea/vomiting.   Plan: 1) EGD with Dr. Perry. 2) Trial of clear liquid diet. Nyheem Binette D 12/06/2017, 11:12 AM     

## 2017-12-06 NOTE — ED Notes (Signed)
DIET TRAY ORDERED FOR PT  

## 2017-12-06 NOTE — ED Notes (Signed)
Attempted to call report x 1  

## 2017-12-07 ENCOUNTER — Encounter: Payer: BLUE CROSS/BLUE SHIELD | Admitting: Gastroenterology

## 2017-12-07 ENCOUNTER — Encounter (HOSPITAL_COMMUNITY)
Admission: EM | Disposition: A | Discharge status: Home / Self Care | Payer: Self-pay | Source: Home / Self Care | Attending: Internal Medicine

## 2017-12-07 ENCOUNTER — Inpatient Hospital Stay (HOSPITAL_COMMUNITY): Payer: BLUE CROSS/BLUE SHIELD | Admitting: Certified Registered Nurse Anesthetist

## 2017-12-07 ENCOUNTER — Encounter (HOSPITAL_COMMUNITY): Payer: Self-pay | Admitting: Certified Registered Nurse Anesthetist

## 2017-12-07 ENCOUNTER — Telehealth: Payer: Self-pay | Admitting: Gastroenterology

## 2017-12-07 DIAGNOSIS — R1115 Cyclical vomiting syndrome unrelated to migraine: Secondary | ICD-10-CM

## 2017-12-07 DIAGNOSIS — G43A0 Cyclical vomiting, not intractable: Secondary | ICD-10-CM

## 2017-12-07 HISTORY — PX: ESOPHAGOGASTRODUODENOSCOPY: SHX5428

## 2017-12-07 LAB — GLUCOSE, CAPILLARY
GLUCOSE-CAPILLARY: 100 mg/dL — AB (ref 65–99)
Glucose-Capillary: 118 mg/dL — ABNORMAL HIGH (ref 65–99)
Glucose-Capillary: 123 mg/dL — ABNORMAL HIGH (ref 65–99)
Glucose-Capillary: 121 mg/dL — ABNORMAL HIGH (ref 65–99)
Glucose-Capillary: 164 mg/dL — ABNORMAL HIGH (ref 65–99)
Glucose-Capillary: 211 mg/dL — ABNORMAL HIGH (ref 65–99)

## 2017-12-07 LAB — BASIC METABOLIC PANEL
ANION GAP: 7 (ref 5–15)
CHLORIDE: 106 mmol/L (ref 101–111)
CO2: 23 mmol/L (ref 22–32)
Calcium: 8.2 mg/dL — ABNORMAL LOW (ref 8.9–10.3)
Creatinine, Ser: 0.63 mg/dL (ref 0.44–1.00)
GFR calc Af Amer: >60 mL/min (ref >60)
GLUCOSE: 111 mg/dL — AB (ref 65–99)
POTASSIUM: 3.7 mmol/L (ref 3.5–5.1)
SODIUM: 136 mmol/L (ref 135–145)

## 2017-12-07 LAB — CBC
HCT: 40.1 % (ref 36.0–46.0)
HEMOGLOBIN: 13.1 g/dL (ref 12.0–15.0)
MCH: 31.3 pg (ref 26.0–34.0)
MCHC: 32.7 g/dL (ref 30.0–36.0)
MCV: 95.7 fL (ref 78.0–100.0)
PLATELETS: 193 10*3/uL (ref 150–400)
RBC: 4.19 MIL/uL (ref 3.87–5.11)
RDW: 12.8 % (ref 11.5–15.5)
WBC: 5.5 10*3/uL (ref 4.0–10.5)

## 2017-12-07 SURGERY — EGD (ESOPHAGOGASTRODUODENOSCOPY)
Anesthesia: Monitor Anesthesia Care

## 2017-12-07 MED ORDER — RIVAROXABAN 20 MG PO TABS
20.0000 mg | ORAL_TABLET | Freq: Every day | ORAL | Status: DC
  Administered 2017-12-07 – 2017-12-08 (×2): 20 mg via ORAL
  Filled 2017-12-07 (×2): qty 1

## 2017-12-07 MED ORDER — PROPOFOL 10 MG/ML IV BOLUS
INTRAVENOUS | Status: DC | PRN
  Administered 2017-12-07: 40 mg via INTRAVENOUS
  Administered 2017-12-07 (×2): 20 mg via INTRAVENOUS

## 2017-12-07 MED ORDER — INSULIN ASPART 100 UNIT/ML ~~LOC~~ SOLN
0.0000 [IU] | Freq: Three times a day (TID) | SUBCUTANEOUS | Status: DC
  Administered 2017-12-07 – 2017-12-08 (×2): 2 [IU] via SUBCUTANEOUS
  Administered 2017-12-08: 1 [IU] via SUBCUTANEOUS
  Administered 2017-12-09: 5 [IU] via SUBCUTANEOUS
  Administered 2017-12-09: 3 [IU] via SUBCUTANEOUS

## 2017-12-07 MED ORDER — PANTOPRAZOLE SODIUM 40 MG PO TBEC
40.0000 mg | DELAYED_RELEASE_TABLET | Freq: Every day | ORAL | Status: DC
  Administered 2017-12-07 – 2017-12-09 (×2): 40 mg via ORAL
  Filled 2017-12-07 (×2): qty 1

## 2017-12-07 MED ORDER — METOPROLOL TARTRATE 25 MG PO TABS
37.5000 mg | ORAL_TABLET | Freq: Two times a day (BID) | ORAL | Status: DC
  Administered 2017-12-07 – 2017-12-09 (×4): 37.5 mg via ORAL
  Filled 2017-12-07 (×4): qty 1

## 2017-12-07 MED ORDER — PRAVASTATIN SODIUM 20 MG PO TABS
20.0000 mg | ORAL_TABLET | Freq: Every day | ORAL | Status: DC
  Administered 2017-12-07 – 2017-12-08 (×2): 20 mg via ORAL
  Filled 2017-12-07 (×2): qty 1

## 2017-12-07 MED ORDER — ONDANSETRON HCL 4 MG/2ML IJ SOLN
INTRAMUSCULAR | Status: DC | PRN
  Administered 2017-12-07: 4 mg via INTRAVENOUS

## 2017-12-07 MED ORDER — LIDOCAINE 2% (20 MG/ML) 5 ML SYRINGE
INTRAMUSCULAR | Status: DC | PRN
  Administered 2017-12-07: 40 mg via INTRAVENOUS

## 2017-12-07 MED ORDER — LISINOPRIL 5 MG PO TABS
5.0000 mg | ORAL_TABLET | Freq: Every day | ORAL | Status: DC
  Administered 2017-12-07 – 2017-12-09 (×2): 5 mg via ORAL
  Filled 2017-12-07 (×2): qty 1

## 2017-12-07 MED ORDER — LACTATED RINGERS IV SOLN
INTRAVENOUS | Status: DC | PRN
  Administered 2017-12-07: 10:00:00 via INTRAVENOUS

## 2017-12-07 MED ORDER — PROPOFOL 500 MG/50ML IV EMUL
INTRAVENOUS | Status: DC | PRN
  Administered 2017-12-07: 100 ug/kg/min via INTRAVENOUS

## 2017-12-07 MED ORDER — SODIUM CHLORIDE 0.9 % IV SOLN: INTRAVENOUS | Status: DC

## 2017-12-07 NOTE — Transfer of Care (Signed)
Immediate Anesthesia Transfer of Care Note  Patient: Kelly Lang  Procedure(s) Performed: ESOPHAGOGASTRODUODENOSCOPY (EGD) (N/A )  Patient Location: Endoscopy Unit  Anesthesia Type:MAC  Level of Consciousness: awake, alert  and oriented  Airway & Oxygen Therapy: Patient Spontanous Breathing and Patient connected to nasal cannula oxygen  Post-op Assessment: Report given to RN and Post -op Vital signs reviewed and stable  Post vital signs: Reviewed and stable  Last Vitals:  Vitals:   12/07/17 0934 12/07/17 1112  BP: (!) 141/87 (!) 119/57  Pulse: 63 77  Resp: 10 18  Temp: 36.9 C   SpO2: 97% 95%    Last Pain:  Vitals:   12/07/17 0934  TempSrc: Oral  PainSc:          Complications: No apparent anesthesia complications

## 2017-12-07 NOTE — Plan of Care (Signed)
  Health Behavior/Discharge Planning: Ability to manage health-related needs will improve 12/07/2017 0824 - Progressing by Azzie Roup I, RN   Clinical Measurements: Ability to maintain clinical measurements within normal limits will improve 12/07/2017 0824 - Progressing by Ashley Royalty, RN   Clinical Measurements: Will remain free from infection 12/07/2017 0824 - Progressing by Ashley Royalty, RN

## 2017-12-07 NOTE — Op Note (Signed)
[facility] Patient Name: Kelly Lang Procedure Date : 12/07/2017 MRN: [id number] Attending MD: Wilhemina Bonito. Marina Goodell , MD Date of Birth: 03-15-1961 CSN: [id number] Age: 57 Admit Type: Inpatient Procedure:                Upper GI endoscopy Indications:              Epigastric abdominal pain, Hematemesis, Nausea with                            vomiting Providers:                Wilhemina Bonito. Marina Goodell, MD, Jacquiline Doe, RN, Margo Aye,                            Technician Referring MD:              Medicines:                Monitored Anesthesia Care Complications:            No immediate complications. Estimated Blood Loss:     Estimated blood loss: none. Procedure:                Pre-Anesthesia Assessment:                           - Prior to the procedure, a History and Physical                            was performed, and patient medications and                            allergies were reviewed. The patient's tolerance of                            previous anesthesia was also reviewed. The risks                            and benefits of the procedure and the sedation                            options and risks were discussed with the patient.                            All questions were answered, and informed consent                            was obtained. Prior Anticoagulants: The patient has                            taken Xarelto (rivaroxaban), last dose was 3 days                            prior to procedure. ASA Grade Assessment: III - A  patient with severe systemic disease. After                            reviewing the risks and benefits, the patient was                            deemed in satisfactory condition to undergo the                            procedure.                           After obtaining informed consent, the endoscope was                            passed under direct vision. Throughout the   procedure, the patient's blood pressure, pulse, and                            oxygen saturations were monitored continuously. The                            [id number] ([id number]) scope was introduced through the                            mouth, and advanced to the second part of duodenum.                            The upper GI endoscopy was accomplished without                            difficulty. The patient tolerated the procedure                            well. Scope In: Scope Out: Findings:      The esophagus was normal.      The stomach was normal.      The examined duodenum was normal.      The cardia and gastric fundus were normal on retroflexion. Impression:               - Normal EGD. No cause for symptom complex                            identified. Moderate Sedation:      none Recommendation:           - Patient has a contact number available for                            emergencies. The signs and symptoms of potential                            delayed complications were discussed with the                            patient. Return to normal activities  tomorrow.                            Written discharge instructions were provided to the                            patient.                           - Resume previous diet.                           - Continue present medications, including PPI.                           - okay to resume anticoagulation                           - Solid-phase gastric emptying scan. Rule out                            gastroparesis Procedure Code(s):        --- Professional ---                           [phone], Esophagogastroduodenoscopy, flexible,                            transoral; diagnostic, including collection of                            specimen(s) by brushing or washing, when performed                            (separate procedure) Diagnosis Code(s):        --- Professional ---                           R10.13,  Epigastric pain                           K92.0, Hematemesis                           R11.2, Nausea with vomiting, unspecified CPT copyright 2016 [employer]. All rights reserved. The codes documented in this report are preliminary and upon coder review may  be revised to meet current compliance requirements. Wilhemina Bonito. Marina Goodell, MD 12/07/2017 11:08:12 AM This report has been signed electronically. Number of Addenda: 0

## 2017-12-07 NOTE — Interval H&P Note (Signed)
History and Physical Interval Note:  12/07/2017 10:46 AM  Kelly Lang  has presented today for surgery, with the diagnosis of Hematemesis, nausea, vomiting, epigastric pain  The various methods of treatment have been discussed with the patient and family. After consideration of risks, benefits and other options for treatment, the patient has consented to  Procedure(s): ESOPHAGOGASTRODUODENOSCOPY (EGD) (N/A) as a surgical intervention .  The patient's history has been reviewed, patient examined, no change in status, stable for surgery.  I have reviewed the patient's chart and labs.  Questions were answered to the patient's satisfaction.     Yancey Flemings

## 2017-12-07 NOTE — Anesthesia Procedure Notes (Signed)
Procedure Name: MAC Date/Time: 12/07/2017 10:48 AM Performed by: Candis Shine, CRNA Pre-anesthesia Checklist: Patient identified, Emergency Drugs available, Suction available, Patient being monitored and Timeout performed Patient Re-evaluated:Patient Re-evaluated prior to induction Oxygen Delivery Method: Nasal cannula Dental Injury: Teeth and Oropharynx as per pre-operative assessment

## 2017-12-07 NOTE — Anesthesia Postprocedure Evaluation (Signed)
Anesthesia Post Note  Patient: Kelly Lang  Procedure(s) Performed: ESOPHAGOGASTRODUODENOSCOPY (EGD) (N/A )     Patient location during evaluation: PACU Anesthesia Type: MAC Level of consciousness: awake and alert Pain management: pain level controlled Vital Signs Assessment: post-procedure vital signs reviewed and stable Respiratory status: spontaneous breathing, nonlabored ventilation and respiratory function stable Cardiovascular status: stable and blood pressure returned to baseline Postop Assessment: no apparent nausea or vomiting Anesthetic complications: no    Last Vitals:  Vitals:   12/07/17 1120 12/07/17 1131  BP: (!) 141/60 (!) 147/76  Pulse: 63 77  Resp: 20 (!) 23  Temp:  36.6 C  SpO2: 94% 95%    Last Pain:  Vitals:   12/07/17 1131  TempSrc: Oral  PainSc:                  Ademola Vert,W. EDMOND

## 2017-12-07 NOTE — Anesthesia Preprocedure Evaluation (Addendum)
Anesthesia Evaluation  Patient identified by MRN, date of birth, ID band Patient awake    Reviewed: Allergy & Precautions, H&P , NPO status , Patient's Chart, lab work & pertinent test results  Airway Mallampati: II  TM Distance: >3 FB Neck ROM: Full    Dental no notable dental hx. (+) Teeth Intact, Dental Advisory Given   Pulmonary neg pulmonary ROS, former smoker,    Pulmonary exam normal breath sounds clear to auscultation       Cardiovascular hypertension, Pt. on medications and Pt. on home beta blockers + Peripheral Vascular Disease and +CHF  + dysrhythmias Atrial Fibrillation  Rhythm:Regular Rate:Normal     Neuro/Psych CVA, Residual Symptoms negative psych ROS   GI/Hepatic negative GI ROS, Neg liver ROS,   Endo/Other  diabetes, Insulin Dependent, Oral Hypoglycemic AgentsMorbid obesity  Renal/GU negative Renal ROS  negative genitourinary   Musculoskeletal  (+) Arthritis , Osteoarthritis,    Abdominal   Peds  Hematology negative hematology ROS (+)   Anesthesia Other Findings   Reproductive/Obstetrics negative OB ROS                            Anesthesia Physical Anesthesia Plan  ASA: III  Anesthesia Plan: MAC   Post-op Pain Management:    Induction: Intravenous  PONV Risk Score and Plan: 2 and Propofol infusion and Treatment may vary due to age or medical condition  Airway Management Planned: Nasal Cannula  Additional Equipment:   Intra-op Plan:   Post-operative Plan:   Informed Consent: I have reviewed the patients History and Physical, chart, labs and discussed the procedure including the risks, benefits and alternatives for the proposed anesthesia with the patient or authorized representative who has indicated his/her understanding and acceptance.   Dental advisory given  Plan Discussed with: CRNA  Anesthesia Plan Comments:         Anesthesia Quick  Evaluation

## 2017-12-07 NOTE — Telephone Encounter (Signed)
She was hospitalized and is having EGD at hospital today.

## 2017-12-07 NOTE — Progress Notes (Signed)
PROGRESS NOTE    Kelly Lang  ZOX:[id number] DOB: 1961-01-01 DOA: 12/05/2017 PCP: Marcine Matar, MD     Brief Narrative:  Kelly Lang is a 57 yo female with past medical history of hypertension, hyperlipidemia, type 2 diabetes, GERD, A Fib s/p ablation who presents with complaints of epigastric pain associated with nausea, vomiting with hematemesis, diarrhea 5 episodes in the past 24 hours and bright red blood per rectum after bowel movement.  She was most recently seen by Dr. Claudette Head on 1/4 due to persistent epigastric pain, nausea, vomiting, decreased appetite and weight loss.  At that time, she was increased on her PPI. They had planned for outpatient EGD on 1/7 but she came to the ED with blood in her vomit. CT abd/pelvis negative aside from hepatic steatosis.   Assessment & Plan:   Active Problems:   Abdominal pain   GI bleed   Non-intractable cyclical vomiting with nausea   Upper GI bleed -Likely due to [employer] tear in setting of cyclical vomiting, nausea, abdominal pain -CT and/pelvis: No acute abnormalities or findings -EGD 1/7: normal, no cause for symptoms found  -Gastric emptying study ordered -GI consulted  -Hgb stable   Diarrhea -Stool studies pending    HTN -Continue lisinopril, lopressor   HLD -Continue pravachol   DM 2 -SSI   GERD -PPI   A Fib  -Follows Dr. Johney Frame  -S/p ablation 06/30/17  -Xarelto, lopressor  -Now NSR    DVT prophylaxis: Xarelto Code Status: Full Family Communication: No family at bedside Disposition Plan: Pending improvement and work up   Consultants:   GI   Procedures:   EGD 1/7  Antimicrobials:  Anti-infectives (From admission, onward)   None        Subjective: Patient doing well this morning.  No nausea, vomiting, diarrhea since admission.  Abdominal pain is well controlled with IV pain medication and IV Protonix.  She states that she only vomits when she has severe abdominal pain.   Tolerated clear liquids.  Objective: Vitals:   12/07/17 1112 12/07/17 1120 12/07/17 1131 12/07/17 1200  BP: (!) 119/57 (!) 141/60 (!) 147/76 118/81  Pulse: 77 63 77 68  Resp: 18 20 (!) 23 19  Temp:   97.8 F (36.6 C) 98.6 F (37 C)  TempSrc:   Oral Oral  SpO2: 95% 94% 95% 96%  Weight:      Height:        Intake/Output Summary (Last 24 hours) at 12/07/2017 1255 Last data filed at 12/07/2017 1104 Gross per 24 hour  Intake 925.83 ml  Output -  Net 925.83 ml   Filed Weights   12/05/17 1846  Weight: 107.5 kg (237 lb)    Examination:  General exam: Appears calm and comfortable  Respiratory system: Clear to auscultation. Respiratory effort normal. Cardiovascular system: S1 & S2 heard, RRR. No JVD, murmurs, rubs, gallops or clicks. No pedal edema. Gastrointestinal system: Abdomen is nondistended, soft and TTP epigastric. No organomegaly or masses felt.  Central nervous system: Alert and oriented. No focal neurological deficits. Extremities: Symmetric 5 x 5 power. Skin: No rashes, lesions or ulcers Psychiatry: Judgement and insight appear normal. Mood & affect appropriate.   Data Reviewed: I have personally reviewed following labs and imaging studies  CBC: Recent Labs  Lab 12/02/17 1125 12/05/17 1839 12/05/17 2225 12/06/17 0428 12/07/17 0453  WBC 7.4 11.1* 9.2 7.0 5.5  HGB 15.9* 15.7* 14.6 14.3 13.1  HCT 47.1* 46.8* 43.9 44.1 40.1  MCV 95.5  95.3 96.3 95.9 95.7  PLT 231 232 236 220 193   Basic Metabolic Panel: Recent Labs  Lab 12/02/17 1125 12/05/17 1839 12/06/17 0428 12/07/17 0453  NA 134* 137 133* 136  K 4.2 4.5 3.8 3.7  CL 101 105 102 106  CO2 22 21* 22 23  GLUCOSE 142* 171* 118* 111*  BUN 8 9 9  <5*  CREATININE 0.64 0.63 0.63 0.63  CALCIUM 9.0 9.3 8.9 8.2*   GFR: Estimated Creatinine Clearance: 98.9 mL/min (by C-G formula based on SCr of 0.63 mg/dL). Liver Function Tests: Recent Labs  Lab 12/02/17 1125 12/05/17 1839 12/06/17 0428  AST 21 34 27  ALT  29 51 46  ALKPHOS 60 60 51  BILITOT 0.8 0.9 1.0  PROT 7.8 7.9 6.9  ALBUMIN 3.9 3.8 3.3*   Recent Labs  Lab 12/02/17 1125 12/05/17 1839  LIPASE 23 23   No results for input(s): AMMONIA in the last 168 hours. Coagulation Profile: No results for input(s): INR, PROTIME in the last 168 hours. Cardiac Enzymes: No results for input(s): CKTOTAL, CKMB, CKMBINDEX, TROPONINI in the last 168 hours. BNP (last 3 results) No results for input(s): PROBNP in the last 8760 hours. HbA1C: No results for input(s): HGBA1C in the last 72 hours. CBG: Recent Labs  Lab 12/06/17 1957 12/07/17 0039 12/07/17 0417 12/07/17 0748 12/07/17 1245  GLUCAP 137* 100* 118* 123* 121*   Lipid Profile: No results for input(s): CHOL, HDL, LDLCALC, TRIG, CHOLHDL, LDLDIRECT in the last 72 hours. Thyroid Function Tests: No results for input(s): TSH, T4TOTAL, FREET4, T3FREE, THYROIDAB in the last 72 hours. Anemia Panel: No results for input(s): VITAMINB12, FOLATE, FERRITIN, TIBC, IRON, RETICCTPCT in the last 72 hours. Sepsis Labs: Recent Labs  Lab 12/02/17 1549 12/02/17 1818  LATICACIDVEN 1.20 0.86    No results found for this or any previous visit (from the past 240 hour(s)).     Radiology Studies: Ct Abdomen Pelvis Wo Contrast  Result Date: 12/06/2017 CLINICAL DATA:  57 year old female with abdominal pain, nausea and vomiting for 2 weeks. EXAM: CT ABDOMEN AND PELVIS WITHOUT CONTRAST TECHNIQUE: Multidetector CT imaging of the abdomen and pelvis was performed following the standard protocol without IV contrast. COMPARISON:  12/02/2017 CT and prior studies FINDINGS: Please note that parenchymal abnormalities may be missed without intravenous contrast. Lower chest: No acute abnormality. Mild left basilar scar again noted. Hepatobiliary: Mild hepatic steatosis identified. No focal hepatic abnormalities are present. The gallbladder is unremarkable. No biliary dilatation. Pancreas: Unremarkable Spleen: Unremarkable  Adrenals/Urinary Tract: The kidneys, adrenal glands and bladder are unremarkable. Stomach/Bowel: Stomach is within normal limits. Appendix appears normal. No evidence of bowel wall thickening, distention, or inflammatory changes. A few scattered colonic diverticula are identified without evidence of diverticulitis. Vascular/Lymphatic: Aortic atherosclerosis. No enlarged abdominal or pelvic lymph nodes. Reproductive: Uterus and bilateral adnexa are unremarkable. Other: No ascites, focal collection, pneumoperitoneum or abdominal wall hernia. Musculoskeletal: No acute or significant osseous findings. IMPRESSION: 1. No acute abnormalities or findings to suggest a cause for this patient's abdominal pain. 2. Hepatic steatosis 3.  Aortic Atherosclerosis (ICD10-I70.0). Electronically Signed   By: Harmon Pier M.D.   On: 12/06/2017 07:38      Scheduled Meds: . insulin aspart  0-9 Units Subcutaneous Q4H  . sucralfate  1 g Oral TID WC & HS   Continuous Infusions: . pantoprozole (PROTONIX) infusion 8 mg/hr (12/07/17 0600)     LOS: 1 day    Time spent: 30 minutes   Noralee Stain, DO [facility]  [email or web address] Password [facility] 12/07/2017, 12:55 PM

## 2017-12-08 ENCOUNTER — Encounter (HOSPITAL_COMMUNITY): Payer: Self-pay | Admitting: Internal Medicine

## 2017-12-08 ENCOUNTER — Inpatient Hospital Stay (HOSPITAL_COMMUNITY): Payer: BLUE CROSS/BLUE SHIELD

## 2017-12-08 DIAGNOSIS — R1013 Epigastric pain: Secondary | ICD-10-CM

## 2017-12-08 DIAGNOSIS — G43A0 Cyclical vomiting, not intractable: Secondary | ICD-10-CM

## 2017-12-08 LAB — BASIC METABOLIC PANEL
ANION GAP: 11 (ref 5–15)
BUN: 8 mg/dL (ref 6–20)
CHLORIDE: 103 mmol/L (ref 101–111)
CO2: 21 mmol/L — ABNORMAL LOW (ref 22–32)
CREATININE: 0.74 mg/dL (ref 0.44–1.00)
Calcium: 8.5 mg/dL — ABNORMAL LOW (ref 8.9–10.3)
GFR calc non Af Amer: >60 mL/min (ref >60)
Glucose, Bld: 193 mg/dL — ABNORMAL HIGH (ref 65–99)
Potassium: 3.7 mmol/L (ref 3.5–5.1)
SODIUM: 135 mmol/L (ref 135–145)

## 2017-12-08 LAB — CBC
HCT: 41.3 % (ref 36.0–46.0)
HEMOGLOBIN: 13.5 g/dL (ref 12.0–15.0)
MCH: 31.4 pg (ref 26.0–34.0)
MCHC: 32.7 g/dL (ref 30.0–36.0)
MCV: 96 fL (ref 78.0–100.0)
Platelets: 202 10*3/uL (ref 150–400)
RBC: 4.3 MIL/uL (ref 3.87–5.11)
RDW: 13 % (ref 11.5–15.5)
WBC: 5.3 10*3/uL (ref 4.0–10.5)

## 2017-12-08 LAB — GLUCOSE, CAPILLARY
GLUCOSE-CAPILLARY: 189 mg/dL — AB (ref 65–99)
GLUCOSE-CAPILLARY: 182 mg/dL — AB (ref 65–99)
GLUCOSE-CAPILLARY: 163 mg/dL — AB (ref 65–99)
Glucose-Capillary: 178 mg/dL — ABNORMAL HIGH (ref 65–99)
Glucose-Capillary: 149 mg/dL — ABNORMAL HIGH (ref 65–99)
Glucose-Capillary: 142 mg/dL — ABNORMAL HIGH (ref 65–99)

## 2017-12-08 LAB — GASTROINTESTINAL PANEL BY PCR, STOOL (REPLACES STOOL CULTURE)
ASTROVIRUS: NOT DETECTED
Adenovirus F40/41: NOT DETECTED
CYCLOSPORA CAYETANENSIS: NOT DETECTED
Campylobacter species: NOT DETECTED
Cryptosporidium: NOT DETECTED
ENTEROAGGREGATIVE E COLI (EAEC): NOT DETECTED
ENTEROTOXIGENIC E COLI (ETEC): NOT DETECTED
Entamoeba histolytica: NOT DETECTED
Enteropathogenic E coli (EPEC): NOT DETECTED
GIARDIA LAMBLIA: NOT DETECTED
Norovirus GI/GII: NOT DETECTED
Plesimonas shigelloides: NOT DETECTED
Rotavirus A: NOT DETECTED
SAPOVIRUS (I, II, IV, AND V): NOT DETECTED
SHIGA LIKE TOXIN PRODUCING E COLI (STEC): NOT DETECTED
Salmonella species: NOT DETECTED
Shigella/Enteroinvasive E coli (EIEC): NOT DETECTED
VIBRIO SPECIES: NOT DETECTED
Vibrio cholerae: NOT DETECTED
Yersinia enterocolitica: NOT DETECTED

## 2017-12-08 MED ORDER — TECHNETIUM TC 99M SULFUR COLLOID
1.1000 | Freq: Once | INTRAVENOUS | Status: AC | PRN
  Administered 2017-12-08: 1.1 via INTRAVENOUS

## 2017-12-08 MED ORDER — METOCLOPRAMIDE HCL 5 MG/ML IJ SOLN
5.0000 mg | Freq: Three times a day (TID) | INTRAMUSCULAR | Status: DC
  Administered 2017-12-08 – 2017-12-09 (×3): 5 mg via INTRAVENOUS
  Filled 2017-12-08 (×3): qty 2

## 2017-12-08 NOTE — Progress Notes (Signed)
PROGRESS NOTE    Kelly Lang  OIZ:[id number] DOB: [birth year] DOA: 12/05/2017 PCP: Marcine Matar, MD     Brief Narrative:  Kelly Lang is a 57 yo female with past medical history of hypertension, hyperlipidemia, type 2 diabetes, GERD, A Fib s/p ablation who presents with complaints of epigastric pain associated with nausea, vomiting with hematemesis, diarrhea 5 episodes in the past 24 hours and bright red blood per rectum after bowel movement.  She was most recently seen by Dr. Claudette Head on 1/4 due to persistent epigastric pain, nausea, vomiting, decreased appetite and weight loss.  At that time, she was increased on her PPI. They had planned for outpatient EGD on 1/7 but she came to the ED with blood in her vomit. CT abd/pelvis negative aside from hepatic steatosis.   Assessment & Plan:   Active Problems:   Abdominal pain   GI bleed   Non-intractable cyclical vomiting with nausea   Upper GI bleed -Likely due to [employer] tear in setting of cyclical vomiting, nausea, abdominal pain -CT and/pelvis: No acute abnormalities or findings -EGD 1/7: normal, no cause for symptoms found  -Gastric emptying study pending, if positive will trial reglan  -GI consulted  -Hgb stable   Diarrhea -Stool studies pending    HTN -Continue lisinopril, lopressor   HLD -Continue pravachol   DM 2 -SSI   GERD -PPI   A Fib  -Follows Dr. Johney Frame  -S/p ablation 06/30/17  -Xarelto, lopressor  -Now NSR    DVT prophylaxis: Xarelto Code Status: Full Family Communication: No family at bedside Disposition Plan: Pending improvement and work up   Consultants:   GI   Procedures:   EGD 1/7  Antimicrobials:  Anti-infectives (From admission, onward)   None       Subjective: Patient doing well this morning. Tolerated meal yesterday but did have some post-meal abdominal pain and mild nausea, no vomiting.    Objective: Vitals:   12/07/17 2100 12/08/17 0100 12/08/17 0949  12/08/17 1040  BP: 130/64 120/69 124/65 130/77  Pulse: 66 61 (!) 54 (!) 55  Resp: 18  16 16   Temp: 98.4 F (36.9 C) 98.2 F (36.8 C) 98.3 F (36.8 C) 97.8 F (36.6 C)  TempSrc: Oral Oral Oral Oral  SpO2: 97%   98%  Weight:      Height:       No intake or output data in the 24 hours ending 12/08/17 1530 Filed Weights   12/05/17 1846  Weight: 107.5 kg (237 lb)    Examination:  General exam: Appears calm and comfortable  Respiratory system: Clear to auscultation. Respiratory effort normal. Cardiovascular system: S1 & S2 heard, RRR. No JVD, murmurs, rubs, gallops or clicks. No pedal edema. Gastrointestinal system: Abdomen is nondistended, soft and TTP epigastric. No organomegaly or masses felt.  Central nervous system: Alert and oriented. No focal neurological deficits. Extremities: Symmetric 5 x 5 power. Skin: No rashes, lesions or ulcers Psychiatry: Judgement and insight appear normal. Mood & affect appropriate.   Data Reviewed: I have personally reviewed following labs and imaging studies  CBC: Recent Labs  Lab 12/05/17 1839 12/05/17 2225 12/06/17 0428 12/07/17 0453 12/08/17 0436  WBC 11.1* 9.2 7.0 5.5 5.3  HGB 15.7* 14.6 14.3 13.1 13.5  HCT 46.8* 43.9 44.1 40.1 41.3  MCV 95.3 96.3 95.9 95.7 96.0  PLT 232 236 220 193 202   Basic Metabolic Panel: Recent Labs  Lab 12/02/17 1125 12/05/17 1839 12/06/17 0428 12/07/17 0453 12/08/17 3382  NA 134* 137 133* 136 135  K 4.2 4.5 3.8 3.7 3.7  CL 101 105 102 106 103  CO2 22 21* 22 23 21*  GLUCOSE 142* 171* 118* 111* 193*  BUN 8 9 9  <5* 8  CREATININE 0.64 0.63 0.63 0.63 0.74  CALCIUM 9.0 9.3 8.9 8.2* 8.5*   GFR: Estimated Creatinine Clearance: 98.9 mL/min (by C-G formula based on SCr of 0.74 mg/dL). Liver Function Tests: Recent Labs  Lab 12/02/17 1125 12/05/17 1839 12/06/17 0428  AST 21 34 27  ALT 29 51 46  ALKPHOS 60 60 51  BILITOT 0.8 0.9 1.0  PROT 7.8 7.9 6.9  ALBUMIN 3.9 3.8 3.3*   Recent Labs  Lab  12/02/17 1125 12/05/17 1839  LIPASE 23 23   No results for input(s): AMMONIA in the last 168 hours. Coagulation Profile: No results for input(s): INR, PROTIME in the last 168 hours. Cardiac Enzymes: No results for input(s): CKTOTAL, CKMB, CKMBINDEX, TROPONINI in the last 168 hours. BNP (last 3 results) No results for input(s): PROBNP in the last 8760 hours. HbA1C: No results for input(s): HGBA1C in the last 72 hours. CBG: Recent Labs  Lab 12/07/17 1946 12/08/17 0101 12/08/17 0353 12/08/17 0727 12/08/17 1133  GLUCAP 211* 178* 189* 182* 149*   Lipid Profile: No results for input(s): CHOL, HDL, LDLCALC, TRIG, CHOLHDL, LDLDIRECT in the last 72 hours. Thyroid Function Tests: No results for input(s): TSH, T4TOTAL, FREET4, T3FREE, THYROIDAB in the last 72 hours. Anemia Panel: No results for input(s): VITAMINB12, FOLATE, FERRITIN, TIBC, IRON, RETICCTPCT in the last 72 hours. Sepsis Labs: Recent Labs  Lab 12/02/17 1549 12/02/17 1818  LATICACIDVEN 1.20 0.86    No results found for this or any previous visit (from the past 240 hour(s)).     Radiology Studies: No results found.    Scheduled Meds: . insulin aspart  0-9 Units Subcutaneous TID WC  . lisinopril  5 mg Oral Daily  . metoprolol tartrate  37.5 mg Oral BID  . pantoprazole  40 mg Oral Daily  . pravastatin  20 mg Oral q1800  . rivaroxaban  20 mg Oral Q supper   Continuous Infusions:    LOS: 2 days    Time spent: 20 minutes   Noralee Stain, DO [facility] [email or web address] Password [email or web address] 12/08/2017, 3:30 PM

## 2017-12-08 NOTE — Progress Notes (Signed)
Daily Rounding Note  12/08/2017, 9:36 AM  LOS: 2 days   SUBJECTIVE:   Seen in Nuc med, has another 60 mins before GES study will be finished.  No N/V, it is generally triggered only with PO.  Feels well.      OBJECTIVE:         Vital signs in last 24 hours:    Temp:  [97.8 F (36.6 C)-98.6 F (37 C)] 98.2 F (36.8 C) (01/08 0100) Pulse Rate:  [61-77] 61 (01/08 0100) Resp:  [17-23] 18 (01/07 2100) BP: (114-147)/(57-81) 120/69 (01/08 0100) SpO2:  [94 %-98 %] 97 % (01/07 2100) Last BM Date: 12/08/17 Filed Weights   12/05/17 1846  Weight: 107.5 kg (237 lb)   General: looks well, comfortable   Heart: RRR Chest: clear bil.  No labored breathing. Abdomen: ND.  Active BS.  Slight epigastric tenderness.  BS present, no tinkling or tympanitic sounds  Extremities: no CCE. Neuro/Psych:  Oriented x 3.  Fully alert.  Calm.    Intake/Output from previous day: 01/07 0701 - 01/08 0700 In: 540 [P.O.:240; I.V.:300] Out: -   Intake/Output this shift: No intake/output data recorded.  Lab Results: Recent Labs    12/06/17 0428 12/07/17 0453 12/08/17 0436  WBC 7.0 5.5 5.3  HGB 14.3 13.1 13.5  HCT 44.1 40.1 41.3  PLT 220 193 202   BMET Recent Labs    12/06/17 0428 12/07/17 0453 12/08/17 0436  NA 133* 136 135  K 3.8 3.7 3.7  CL 102 106 103  CO2 22 23 21*  GLUCOSE 118* 111* 193*  BUN 9 <5* 8  CREATININE 0.63 0.63 0.74  CALCIUM 8.9 8.2* 8.5*   LFT Recent Labs    12/05/17 1839 12/06/17 0428  PROT 7.9 6.9  ALBUMIN 3.8 3.3*  AST 34 27  ALT 51 46  ALKPHOS 60 51  BILITOT 0.9 1.0   PT/INR No results for input(s): LABPROT, INR in the last 72 hours. Hepatitis Panel No results for input(s): HEPBSAG, HCVAB, HEPAIGM, HEPBIGM in the last 72 hours.  Studies/Results: No results found.   Scheduled Meds: . insulin aspart  0-9 Units Subcutaneous TID WC  . lisinopril  5 mg Oral Daily  . metoprolol tartrate  37.5 mg  Oral BID  . pantoprazole  40 mg Oral Daily  . pravastatin  20 mg Oral q1800  . rivaroxaban  20 mg Oral Q supper  . sucralfate  1 g Oral TID WC & HS   Continuous Infusions: PRN Meds:.acetaminophen **OR** acetaminophen, morphine injection, ondansetron (ZOFRAN) IV   ASSESMENT:   *  Nausea, vomiting, hematemesis abd pain 12/02/17 ultrasound: fatty liver, normal GB and biliary tree. 12/02/17 CT: fatty liver and pancreas, colon diverticulosis, fat-containing samm , periumbilical hernia.       12/07/17 EGD: normal study.    12/08/17 GES:  Empiric Protonix and Sucralfate in place.    *  IDDM  *  afib with RVR, on Xarelto.    *  Obesity.     PLAN   *  Await completion of GES.  If gastroparesis confirmed, add trial of Reglan.    *  Stopping Carafate, leave daily Protonix in place.    Jennye Moccasin  12/08/2017, 9:36 AM Pager: [phone]  GI ATTENDING  Interval history and data reviewed. Agree with interval progress note. Await final results of gastric emptying scan. She may eat after the scan is completed. Hopefully discharge home in a.m.  Docia Chuck. Geri Seminole., M.D. [facility] Division of Gastroenterology

## 2017-12-09 DIAGNOSIS — Z794 Long term (current) use of insulin: Secondary | ICD-10-CM

## 2017-12-09 DIAGNOSIS — K3184 Gastroparesis: Secondary | ICD-10-CM

## 2017-12-09 DIAGNOSIS — R1084 Generalized abdominal pain: Secondary | ICD-10-CM

## 2017-12-09 DIAGNOSIS — E1143 Type 2 diabetes mellitus with diabetic autonomic (poly)neuropathy: Principal | ICD-10-CM

## 2017-12-09 DIAGNOSIS — E119 Type 2 diabetes mellitus without complications: Secondary | ICD-10-CM

## 2017-12-09 DIAGNOSIS — G43A0 Cyclical vomiting, not intractable: Secondary | ICD-10-CM

## 2017-12-09 DIAGNOSIS — I10 Essential (primary) hypertension: Secondary | ICD-10-CM

## 2017-12-09 LAB — CBC
HCT: 43.3 % (ref 36.0–46.0)
Hemoglobin: 14.3 g/dL (ref 12.0–15.0)
MCH: 31.4 pg (ref 26.0–34.0)
MCHC: 33 g/dL (ref 30.0–36.0)
MCV: 95 fL (ref 78.0–100.0)
Platelets: 200 10*3/uL (ref 150–400)
RBC: 4.56 MIL/uL (ref 3.87–5.11)
RDW: 12.8 % (ref 11.5–15.5)
WBC: 5.4 10*3/uL (ref 4.0–10.5)

## 2017-12-09 LAB — GLUCOSE, CAPILLARY
GLUCOSE-CAPILLARY: 201 mg/dL — AB (ref 65–99)
GLUCOSE-CAPILLARY: 269 mg/dL — AB (ref 65–99)

## 2017-12-09 LAB — BASIC METABOLIC PANEL
Anion gap: 11 (ref 5–15)
BUN: 10 mg/dL (ref 6–20)
CALCIUM: 8.7 mg/dL — AB (ref 8.9–10.3)
CHLORIDE: 105 mmol/L (ref 101–111)
CO2: 19 mmol/L — AB (ref 22–32)
CREATININE: 0.59 mg/dL (ref 0.44–1.00)
GFR calc non Af Amer: >60 mL/min (ref >60)
GLUCOSE: 174 mg/dL — AB (ref 65–99)
Potassium: 3.6 mmol/L (ref 3.5–5.1)
Sodium: 135 mmol/L (ref 135–145)

## 2017-12-09 MED ORDER — INSULIN GLARGINE 100 UNIT/ML SOLOSTAR PEN: 60.0000 [IU] | PEN_INJECTOR | Freq: Every day | SUBCUTANEOUS | Status: DC

## 2017-12-09 MED ORDER — METOCLOPRAMIDE HCL 10 MG PO TABS
10.0000 mg | ORAL_TABLET | Freq: Three times a day (TID) | ORAL | 0 refills | Status: DC
Start: 2017-12-09 — End: 2017-12-18

## 2017-12-09 MED ORDER — OMEPRAZOLE 40 MG PO CPDR: 40.0000 mg | DELAYED_RELEASE_CAPSULE | Freq: Every day | ORAL | Status: DC

## 2017-12-09 MED ORDER — METOCLOPRAMIDE HCL 10 MG PO TABS: 10.0000 mg | ORAL_TABLET | Freq: Three times a day (TID) | ORAL | Status: DC

## 2017-12-09 MED ORDER — DICLOFENAC SODIUM 1 % TD GEL: 2.0000 g | Freq: Four times a day (QID) | TRANSDERMAL | Status: DC | PRN

## 2017-12-09 MED ORDER — TRIAMCINOLONE ACETONIDE 0.1 % EX CREA: 1.0000 "application " | TOPICAL_CREAM | Freq: Two times a day (BID) | CUTANEOUS | Status: DC | PRN

## 2017-12-09 NOTE — Discharge Instructions (Signed)

## 2017-12-09 NOTE — Discharge Summary (Signed)
Physician Discharge Summary  Kelly Lang ZOX:[id number] DOB: [birth year]  PCP: Ladell Pier, MD  Admit date: 12/05/2017 Discharge date: 12/09/2017  Recommendations for Outpatient Follow-up:  1. Dr. Karle Plumber, PCP in 1 week. 2. Alonza Bogus, PA-C/[facility] on 12/23/17 at 9 AM.  Home Health: None Equipment/Devices: None    Discharge Condition: Improved and stable  CODE STATUS: Full  Diet recommendation: Heart healthy & diabetic diet.  Discharge Diagnoses:  Active Problems:   Abdominal pain   GI bleed   Non-intractable cyclical vomiting with nausea   Brief Summary: Kelly Lang a 57 yo female with pastmedical history of hypertension, hyperlipidemia, type 2 diabetes, GERD, A Fib s/p ablation who presented with complaints of epigastric pain associated with nausea, vomiting with hematemesis, diarrhea 5 episodes in the past 24 hoursand bright red blood per rectum after bowel movement. She was most recently seen by Dr. Lucio Edward on 1/4 due topersistent epigastric pain, nausea, vomiting, decreased appetite and weight loss. At that time, she was increased on her PPI. They hadplanned for outpatientEGD on 1/7 but she came to the ED with blood in her vomit. CT abd/pelvis negative aside from hepatic steatosis.  Assessment & Plan:    Diabetic gastroparesis - Workup as indicated below. - GES positive for gastroparesis. - Significantly improved after addition of Reglan. No abdominal pain or vomiting. Tolerating diet. Having normal BM without blood or melena. - As per [facility] follow-up who discussed with patient: Follow gastroparesis diabetic diet with small/frequent meals, added Reglan 10 mg before meals (and at bedtime if needed) for 2 weeks but can taper off to less frequent dozing or discontinue altogether if nausea and vomiting completely resolve. All these measures were discussed with patient. She was also informed by GI about potential neuro side effects of  Reglan including rigid movement and tardive dyskinesia. She understands to stop Reglan should she have these side effects. I too discussed the side effect of iron with her. She verbalized understanding. GI has arranged outpatient follow-up.  Upper GI bleed -Likely due to [employer] tear in setting of cyclical vomiting, nausea, abdominal pain -CT and/pelvis: No acute abnormalities or findings -EGD 1/7: normal, no cause for symptoms found  -Gastric emptying scan positive for gastroparesis - GI follow-up appreciated. Recommend discharging on Prilosec 40 mg daily and does not need Carafate. - Resolved. Hemoglobin stable.  Diarrhea -    GI pathogen panel PCR is negative. Resolved.  HTN -Continue lisinopril, lopressor . Controlled.   HLD -Continue statins.  DM 2 -  patient was treated in the hospital with NovoLog SSI alone while she was having limited oral intake due to GI symptoms. CBG is mildly uncontrolled and starting to increase.  - last A1c 09/07/17: 7 -  resume home medications at discharge including Lantus, glipizide, Jardiance and metformin  - Close outpatient follow-up and monitoring.   GERD -PPI , reduced to Prilosec 40 mg once daily as per GI recommendations.  A Fib  -Follows Dr. Rayann Heman  -S/p ablation 06/30/17  -Xarelto, lopressor  -Now NSR  - Has outpatient follow-up with cardiology.      Consultants:   GI   Procedures:   EGD 1/7: Normal EGD.     Discharge Instructions  Discharge Instructions    Call MD for:  extreme fatigue   Complete by:  As directed    Call MD for:  persistant dizziness or light-headedness   Complete by:  As directed    Call MD for:  persistant nausea  and vomiting   Complete by:  As directed    Call MD for:  severe uncontrolled pain   Complete by:  As directed    Diet - low sodium heart healthy   Complete by:  As directed    Diet Carb Modified   Complete by:  As directed    Increase activity slowly   Complete by:  As  directed        Medication List    STOP taking these medications   sucralfate 1 g tablet Commonly known as:  CARAFATE     TAKE these medications   clobetasol 0.05 % external solution Commonly known as:  TEMOVATE Apply small quarter size to affected area on scalp BID x 1 week then PRN What changed:    how much to take  how to take this  when to take this  reasons to take this  additional instructions   diclofenac sodium 1 % Gel Commonly known as:  VOLTAREN Apply 2 g topically 4 (four) times daily as needed (pain).   glipiZIDE 5 MG tablet Commonly known as:  GLUCOTROL Take 1 tablet (5 mg total) by mouth 2 (two) times daily before a meal.   glucose blood test strip Commonly known as:  ONE TOUCH TEST STRIPS Use as instructed   Insulin Glargine 100 UNIT/ML Solostar Pen Commonly known as:  LANTUS SOLOSTAR Inject 60 Units into the skin daily at 10 pm.   Insulin Pen Needle 32G X 4 MM Misc Commonly known as:  ULTICARE MICRO PEN NEEDLES 1 applicator by Does not apply route at bedtime.   JARDIANCE 10 MG Tabs tablet Generic drug:  empagliflozin TAKE 1 TABLET BY MOUTH DAILY.   lisinopril 10 MG tablet Commonly known as:  PRINIVIL,ZESTRIL Take 0.5 tablets (5 mg total) by mouth daily.   lovastatin 20 MG tablet Commonly known as:  MEVACOR TAKE 1 TABLET BY MOUTH AT BEDTIME.   metFORMIN 1000 MG tablet Commonly known as:  GLUCOPHAGE Take 1 tablet (1,000 mg total) by mouth 2 (two) times daily with a meal.   metoCLOPramide 10 MG tablet Commonly known as:  REGLAN Take 1 tablet (10 mg total) by mouth 3 (three) times daily before meals.   metoprolol tartrate 25 MG tablet Commonly known as:  LOPRESSOR TAKE 1 & 1/2 TABLET BY MOUTH 2 TIMES DAILY What changed:  See the new instructions.   omeprazole 40 MG capsule Commonly known as:  PRILOSEC Take 1 capsule (40 mg total) by mouth daily. What changed:  when to take this   ONE TOUCH ULTRA 2 w/Device Kit 1 application by  Does not apply route 2 (two) times daily.   onetouch ultrasoft lancets Use as instructed   triamcinolone cream 0.1 % Commonly known as:  KENALOG Apply 1 application topically 2 (two) times daily as needed (rash).   XARELTO 20 MG Tabs tablet Generic drug:  rivaroxaban TAKE 1 TABLET BY MOUTH DAILY WITH SUPPER. What changed:  See the new instructions.      Follow-up Information    Zehr, Laban Emperor, PA-C Follow up on 12/23/2017.   Specialty:  Gastroenterology Why:  9AM follow up with GI PA regarding your nausea, vomiting.   Contact information: [place] [address] [phone]        Ladell Pier, MD. Schedule an appointment as soon as possible for a visit in 1 week(s).   Specialty:  Internal Medicine Contact information: [address] [place] [address] [phone]  Allergies  Allergen Reactions  . Codeine Itching and Rash      Procedures/Studies: Ct Abdomen Pelvis Wo Contrast  Result Date: 12/06/2017 CLINICAL DATA:  57 year old female with abdominal pain, nausea and vomiting for 2 weeks. EXAM: CT ABDOMEN AND PELVIS WITHOUT CONTRAST TECHNIQUE: Multidetector CT imaging of the abdomen and pelvis was performed following the standard protocol without IV contrast. COMPARISON:  12/02/2017 CT and prior studies FINDINGS: Please note that parenchymal abnormalities may be missed without intravenous contrast. Lower chest: No acute abnormality. Mild left basilar scar again noted. Hepatobiliary: Mild hepatic steatosis identified. No focal hepatic abnormalities are present. The gallbladder is unremarkable. No biliary dilatation. Pancreas: Unremarkable Spleen: Unremarkable Adrenals/Urinary Tract: The kidneys, adrenal glands and bladder are unremarkable. Stomach/Bowel: Stomach is within normal limits. Appendix appears normal. No evidence of bowel wall thickening, distention, or inflammatory changes. A few scattered colonic diverticula are identified  without evidence of diverticulitis. Vascular/Lymphatic: Aortic atherosclerosis. No enlarged abdominal or pelvic lymph nodes. Reproductive: Uterus and bilateral adnexa are unremarkable. Other: No ascites, focal collection, pneumoperitoneum or abdominal wall hernia. Musculoskeletal: No acute or significant osseous findings. IMPRESSION: 1. No acute abnormalities or findings to suggest a cause for this patient's abdominal pain. 2. Hepatic steatosis 3.  Aortic Atherosclerosis (ICD10-I70.0). Electronically Signed   By: Margarette Canada M.D.   On: 12/06/2017 07:38   Nm Gastric Emptying  Result Date: 12/08/2017 CLINICAL DATA:  Nausea and vomiting.  Diabetes mellitus. EXAM: NUCLEAR MEDICINE GASTRIC EMPTYING SCAN TECHNIQUE: After oral ingestion of radiolabeled meal, sequential abdominal images were obtained for 4 hours. Percentage of activity emptying the stomach was calculated at 1 hour, 2 hour, 3 hour, and 4 hours. RADIOPHARMACEUTICALS:  1.2 mCi Tc-64msulfur colloid in standardized meal including egg COMPARISON:  None. FINDINGS: Expected location of the stomach in the left upper quadrant. Ingested meal empties the stomach gradually over the course of the study. 3.1% emptied at 1 hr ( normal >= 10%) 0.2% emptied at 2 hr ( normal >= 40%) 59.6% emptied at 3 hr ( normal >= 70%) 98.2% emptied at 4 hr ( normal >= 90%) IMPRESSION: There was marked delay in initiation of gastric emptying of solid material with virtually no emptying over the first 2 hours. Emptying remain delayed after 3 hours. By 4 hours post solid material consumption, virtually all material had emptied from the stomach. These findings indicate that given sufficient time, solid material will empty from the stomach. Electronically Signed   By: WLowella GripIII M.D.   On: 12/08/2017 16:17     Subjective: Patient states that she is doing well. Anxious to go home. No abdominal pain, nausea or vomiting since yesterday. Tolerating diet. Had normal BM without  blood or black color stools 2 days ago.  Discharge Exam:  Vitals:   12/09/17 0108 12/09/17 0500 12/09/17 0506 12/09/17 1113  BP: 134/72  120/75 (!) 153/72  Pulse: (!) 57  86 (!) 59  Resp: _0 Temp: 97.6 F (36.4 C)  97.8 F (36.6 C) 98.2 F (36.8 C)  TempSrc: Oral  Oral Oral  SpO2: 97%  100% 97%  Weight:  106.7 kg (235 lb 3.7 oz)    Height:        General: Pt lying comfortably in bed & appears in no obvious distress. Moderately built and obese.  Cardiovascular: S1 & S2 heard, RRR, S1/S2 +. No murmurs, rubs, gallops or clicks. No JVD or pedal edema. Respiratory: Clear to auscultation without wheezing, rhonchi or crackles. No  increased work of breathing. Abdominal:  Non distended, non tender & soft. No organomegaly or masses appreciated. Normal bowel sounds heard. CNS: Alert and oriented. No focal deficits. Extremities: no edema, no cyanosis    The results of significant diagnostics from this hospitalization (including imaging, microbiology, ancillary and laboratory) are listed below for reference.     Microbiology: Recent Results (from the past 240 hour(s))  Gastrointestinal Panel by PCR , Stool     Status: None   Collection Time: 12/08/17  6:39 AM  Result Value Ref Range Status   Campylobacter species NOT DETECTED NOT DETECTED Final   Plesimonas shigelloides NOT DETECTED NOT DETECTED Final   Salmonella species NOT DETECTED NOT DETECTED Final   Yersinia enterocolitica NOT DETECTED NOT DETECTED Final   Vibrio species NOT DETECTED NOT DETECTED Final   Vibrio cholerae NOT DETECTED NOT DETECTED Final   Enteroaggregative E coli (EAEC) NOT DETECTED NOT DETECTED Final   Enteropathogenic E coli (EPEC) NOT DETECTED NOT DETECTED Final   Enterotoxigenic E coli (ETEC) NOT DETECTED NOT DETECTED Final   Shiga like toxin producing E coli (STEC) NOT DETECTED NOT DETECTED Final   Shigella/Enteroinvasive E coli (EIEC) NOT DETECTED NOT DETECTED Final   Cryptosporidium NOT DETECTED  NOT DETECTED Final   Cyclospora cayetanensis NOT DETECTED NOT DETECTED Final   Entamoeba histolytica NOT DETECTED NOT DETECTED Final   Giardia lamblia NOT DETECTED NOT DETECTED Final   Adenovirus F40/41 NOT DETECTED NOT DETECTED Final   Astrovirus NOT DETECTED NOT DETECTED Final   Norovirus GI/GII NOT DETECTED NOT DETECTED Final   Rotavirus A NOT DETECTED NOT DETECTED Final   Sapovirus (I, II, IV, and V) NOT DETECTED NOT DETECTED Final    Comment: Performed at [facility], [address] [place], [place] [address]     Labs: CBC: Recent Labs  Lab 12/05/17 2225 12/06/17 0428 12/07/17 0453 12/08/17 0436 12/09/17 0520  WBC 9.2 7.0 5.5 5.3 5.4  HGB 14.6 14.3 13.1 13.5 14.3  HCT 43.9 44.1 40.1 41.3 43.3  MCV 96.3 95.9 95.7 96.0 95.0  PLT 236 220 193 202 706   Basic Metabolic Panel: Recent Labs  Lab 12/05/17 1839 12/06/17 0428 12/07/17 0453 12/08/17 0436 12/09/17 0520  NA 137 133* 136 135 135  K 4.5 3.8 3.7 3.7 3.6  CL 105 102 106 103 105  CO2 21* 22 23 21* 19*  GLUCOSE 171* 118* 111* 193* 174*  BUN 9 9 <5* 8 10  CREATININE 0.63 0.63 0.63 0.74 0.59  CALCIUM 9.3 8.9 8.2* 8.5* 8.7*   Liver Function Tests: Recent Labs  Lab 12/05/17 1839 12/06/17 0428  AST 34 27  ALT 51 46  ALKPHOS 60 51  BILITOT 0.9 1.0  PROT 7.9 6.9  ALBUMIN 3.8 3.3*   CBG: Recent Labs  Lab 12/08/17 1133 12/08/17 1825 12/08/17 2114 12/09/17 0634 12/09/17 1206  GLUCAP 149* 142* 163* 201* 269*   Urinalysis    Component Value Date/Time   COLORURINE YELLOW 12/05/2017 2153   APPEARANCEUR CLEAR 12/05/2017 2153   LABSPEC 1.039 (H) 12/05/2017 2153   PHURINE 5.0 12/05/2017 2153   GLUCOSEU >=500 (A) 12/05/2017 2153   HGBUR NEGATIVE 12/05/2017 2153   BILIRUBINUR NEGATIVE 12/05/2017 2153   KETONESUR 80 (A) 12/05/2017 2153   PROTEINUR NEGATIVE 12/05/2017 2153   UROBILINOGEN 0.2 09/14/2015 1345   NITRITE NEGATIVE 12/05/2017 2153   LEUKOCYTESUR NEGATIVE 12/05/2017 2153       Time coordinating discharge:  Less than 30 minutes  SIGNED:  Vernell Leep,  MD, FACP, FHM. [facility] Pager [phone]  If 7PM-7AM, please contact night-coverage [email or web address] Password [email or web address] 12/09/2017, 1:08 PM

## 2017-12-09 NOTE — Care Management Note (Signed)
Case Management Note  Patient Details  Name: Kelly Lang MRN: [id number] Date of Birth: [birth year]  Subjective/Objective:  Pt in with abdominal pain. She is from home with family.                  Action/Plan: Pt discharging home with self care. Pt has PCP, insurance, and transportation home. No further needs per CM.  Expected Discharge Date:  12/09/17               Expected Discharge Plan:  Home/Self Care  In-House Referral:     Discharge planning Services     Post Acute Care Choice:    Choice offered to:     DME Arranged:    DME Agency:     HH Arranged:    HH Agency:     Status of Service:  Completed, signed off  If discussed at [employer] of Stay Meetings, dates discussed:    Additional Comments:  Kermit Balo, RN 12/09/2017, 1:13 PM

## 2017-12-09 NOTE — Progress Notes (Signed)
Pt discharged and escorted to [facility] for family pick up.  Reviewed patients medications and home instructions prior to discharge.  Tubes and lines removed per protocol and belongings sent home with patient.  Vitals and assessment stable at discharge.

## 2017-12-09 NOTE — Progress Notes (Signed)
Daily Rounding Note  12/09/2017, 11:37 AM  LOS: 3 days   SUBJECTIVE:   Chief complaint:  Feels much better, no n/v.  No abd pain.  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       OBJECTIVE:         Vital signs in last 24 hours:    Temp:  [97.6 F (36.4 C)-98.2 F (36.8 C)] 98.2 F (36.8 C) (01/09 1113) Pulse Rate:  [57-86] 59 (01/09 1113) Resp:  [17-20] 17 (01/09 1113) BP: (120-153)/(72-76) 153/72 (01/09 1113) SpO2:  [97 %-100 %] 97 % (01/09 1113) Weight:  [106.7 kg (235 lb 3.7 oz)] 106.7 kg (235 lb 3.7 oz) (01/09 0500) Last BM Date: 12/08/17 Filed Weights   12/05/17 1846 12/09/17 0500  Weight: 107.5 kg (237 lb) 106.7 kg (235 lb 3.7 oz)   General: looks well.  overweight   Heart: RRR Chest: clear bil.  Abdomen: soft, NT, active BS, ND.    Extremities: no CCE Neuro/Psych:  Alert, oriented x 3.  Fully engaged and in good spirits.  No obvious weakness, deficits or tremors.    Intake/Output from previous day: No intake/output data recorded.  Intake/Output this shift: Total I/O In: 250 [P.O.:250] Out: -   Lab Results: Recent Labs    12/07/17 0453 12/08/17 0436 12/09/17 0520  WBC 5.5 5.3 5.4  HGB 13.1 13.5 14.3  HCT 40.1 41.3 43.3  PLT 193 202 200   BMET Recent Labs    12/07/17 0453 12/08/17 0436 12/09/17 0520  NA 136 135 135  K 3.7 3.7 3.6  CL 106 103 105  CO2 23 21* 19*  GLUCOSE 111* 193* 174*  BUN <5* 8 10  CREATININE 0.63 0.74 0.59  CALCIUM 8.2* 8.5* 8.7*   LFT No results for input(s): PROT, ALBUMIN, AST, ALT, ALKPHOS, BILITOT, BILIDIR, IBILI in the last 72 hours. PT/INR No results for input(s): LABPROT, INR in the last 72 hours. Hepatitis Panel No results for input(s): HEPBSAG, HCVAB, HEPAIGM, HEPBIGM in the last 72 hours.  Studies/Results: Nm Gastric Emptying  Result Date: 12/08/2017 CLINICAL  DATA:  Nausea and vomiting.  Diabetes mellitus. EXAM: NUCLEAR MEDICINE GASTRIC EMPTYING SCAN TECHNIQUE: After oral ingestion of radiolabeled meal, sequential abdominal images were obtained for 4 hours. Percentage of activity emptying the stomach was calculated at 1 hour, 2 hour, 3 hour, and 4 hours. RADIOPHARMACEUTICALS:  1.2 mCi Tc-34m sulfur colloid in standardized meal including egg COMPARISON:  None. FINDINGS: Expected location of the stomach in the left upper quadrant. Ingested meal empties the stomach gradually over the course of the study. 3.1% emptied at 1 hr ( normal >= 10%) 0.2% emptied at 2 hr ( normal >= 40%) 59.6% emptied at 3 hr ( normal >= 70%) 98.2% emptied at 4 hr ( normal >= 90%) IMPRESSION: There was marked delay in initiation of gastric emptying of solid material with virtually no emptying over the first 2 hours. Emptying remain delayed after 3 hours. By 4 hours post solid material consumption, virtually all material had emptied from the stomach. These findings indicate that given sufficient time, solid material will empty from the stomach. Electronically Signed   By: Bretta Bang III M.D.   On: 12/08/2017 16:17    ASSESMENT:   *  Nausea, vomiting, hematemesis abd pain 12/02/17 ultrasound: fatty liver, normal GB and biliary tree.  12/02/17 CT: fatty liver and pancreas, colon diverticulosis, fat-containing samm , periumbilical hernia.       12/07/17 EGD: normal study.    12/08/17 GES: + delayed but ultimately successful emptying.  Dr Marina Goodell suspects gastroparesis may be result of acute gastroenteritis but she is diabetic so may have a previously subclinical element of diabetic gastroparesis as well.   Empiric Protonix and Sucralfate in place.    *  IDDM  *  afib with RVR, on Xarelto.    *  Obesity.     PLAN   *  folllow gastroparesis diabetic diet with small/frequent meals.  Adding Reglan 10 mg AC (and HS if needed) for 2 weeks but can taper off to less frequent dosing or  discontinue all together if n/v completely resolve.  Resume Prilosec 40 mg q day at discharge.  Does not need Carafate.  All of these measures d/w pt.  She was informed about potential neuro s/e of Reglan including rigid movement and tardive dyskinesia.  She understands to stop Reglan should she have these s/e.  GI signing off.  OK to discharge home.    GI ROV with Zehr PA-C on 1/23  Jennye Moccasin  12/09/2017, 11:37 AM Pager: [phone]  GI ATTENDING  Interval history and data reviewed. Gastric empty scan reviewed. Agree with interval progress note as outlined above. Recommendations as outlined above per my direction. GI follow-up has been arranged. Okay for Discharge. Will sign off.  Wilhemina Bonito. Eda Keys., M.D. [facility] Division of Gastroenterology

## 2017-12-16 ENCOUNTER — Other Ambulatory Visit: Payer: Self-pay | Admitting: Internal Medicine

## 2017-12-16 DIAGNOSIS — Z794 Long term (current) use of insulin: Secondary | ICD-10-CM

## 2017-12-16 DIAGNOSIS — E118 Type 2 diabetes mellitus with unspecified complications: Secondary | ICD-10-CM

## 2017-12-16 MED FILL — JARDIANCE 10 MG TABLET: 10 | 30 days supply | Qty: 30 | Fill #1

## 2017-12-16 MED FILL — LANTUS SOLOSTAR 100 UNITS/M: 100 | 5 days supply | Qty: 3 | Fill #0

## 2017-12-18 ENCOUNTER — Encounter: Payer: Self-pay | Admitting: Physician Assistant

## 2017-12-18 ENCOUNTER — Ambulatory Visit: Payer: BLUE CROSS/BLUE SHIELD | Admitting: Physician Assistant

## 2017-12-18 VITALS — BP 114/80 | HR 72 | Ht 65.0 in | Wt 242.0 lb

## 2017-12-18 DIAGNOSIS — R112 Nausea with vomiting, unspecified: Secondary | ICD-10-CM | POA: Diagnosis not present

## 2017-12-18 DIAGNOSIS — R1013 Epigastric pain: Secondary | ICD-10-CM

## 2017-12-18 MED ORDER — METOCLOPRAMIDE HCL 10 MG PO TABS: 10.0000 mg | ORAL_TABLET | Freq: Two times a day (BID) | ORAL | 2 refills | Status: DC

## 2017-12-18 MED FILL — METOCLOPRAMIDE 10 MG TABLET: 10 | 30 days supply | Qty: 60 | Fill #0

## 2017-12-18 NOTE — Patient Instructions (Signed)
If you are age 55 or older, your body mass index should be between 23-30. Your Body mass index is 40.27 kg/m. If this is out of the aforementioned range listed, please consider follow up with your Primary Care Provider.  If you are age 54 or younger, your body mass index should be between 19-25. Your Body mass index is 40.27 kg/m. If this is out of the aformentioned range listed, please consider follow up with your Primary Care Provider.   We have sent the following medications to your pharmacy for you to pick up at your convenience: Reglan 10 mg twice daily.  Continue Omeprazole 40 mg.  Follow up with Dr. Russella Dar in three months.  We will contact you with an appointment once the schedule becomes available.

## 2017-12-18 NOTE — Progress Notes (Signed)
Chief Complaint: Follow-up from the hospital for nausea, vomiting, epigastric pain and hematemesis  HPI:    [name] is a 57 year old Caucasian female with a past medical history of hypertension, CVA, diabetes, hyperlipidemia and obesity, who was recently seen in the hospital by our service on 12/06/17 for nausea, vomiting and hematemesis.  She presents to clinic today for follow-up.    According to review of chart patient was seen 12/06/17 and described being evaluated in the ER on 1 2/19 for similar symptoms of nausea, vomiting, epigastric pain and hematemesis.  Her symptoms had started early in December.  She was on Xarelto for her prior CVA.  She was scheduled to undergo an EGD with Dr. Fuller Plan 12/07/17 but started to have an acute worsening of her symptoms the day prior to admission and also had some mild hematemesis.  Additionally she had noted some loose stools.  Workup in the ER on multiple occasions had been negative.  She had a minimal drop in hemoglobin.  She had a CT abdomen pelvis without contrast 12/06/17 which showed no acute abnormalities, hepatic steatosis and aortic atherosclerosis.  She was arranged for an EGD.  EGD completed 12/07/17 by Dr. Henrene Pastor was normal and gastric emptying study was recommended.  Gastric emptying study 12/08/17 showed marked delay in initiation of gastric emptying of solid material with virtually no emptying over the first 2 hours.  Emptying remain delayed after 3 hours and by 4 hours post solid material consumption virtually all material emptied from the stomach.  It was discussed that given findings given sufficient time, solid material would empty from the stomach.  Dr. Henrene Pastor expressed that gastroparesis may be a result of acute gastroenteritis but she was also diabetic, so may have a previously subclinical element of diabetic gastroparesis as well.  She was on Protonix and sucralfate.  Reglan was added 10 mg before meals and at bedtime if needed for 2 weeks.  But patient  was instructed to taper off to less frequent dosing or discontinue altogether if nausea vomiting completely resolved.  She her 40 mg of Prilosec was resumed daily.  Her Carafate was stopped.    Today, the patient presents to clinic and tells me that she feels almost 100% better.  She has altered her diet telling me that she has stopped eating fried foods and steak and a lot of fibrous substances.  She has been reading about gastroparesis online and is typically eating 3-4 snacks per day that are low in fiber.  She has switched to fish and chicken as opposed to steak.  She is currently on Reglan 10 mg before breakfast and dinner.  The patient tells me taking this makes her sleepy, so she dropped her lunchtime dose.  Patient also continues Omeprazole 40 mg daily.  Overall she is much better.  She has only had a few occasions when she has minimal epigastric discomfort but this was relieved quickly.    Patient denies fever, chills, blood in her stool, melena, anorexia, change in bowel habits or symptoms that awaken her at night.  Past Medical History:  Diagnosis Date  . Arthritis    "all over" (06/30/2017)  . Chicken pox   . Dyslipidemia   . H/O: hypothyroidism    a. thyroid biopsy 1996 with synthroid. Stopped taking rx and was loss to follow up b. normal thyroid function 10/20/13  . High cholesterol   . Hx of cardiovascular stress test    ETT/Lexiscan Myoview (12/2013):  No ischemia; not  gated; low risk.  Marland Kitchen Hypertension   . Obesity   . Persistent atrial fibrillation ([place])    a diagnosed 10/25/2013  . Seasonal allergies   . Stroke [facility]) 2016   "some speech problems since" (06/30/2017)  . 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 2/2 Afib with RVR for unknown duration (EF: 40-45%)  . Thyroid disease   . Type II diabetes mellitus ([place])    a. hg A1c 10, newly diagnosed (10/26/13)    Past Surgical History:  Procedure Laterality Date  . ATRIAL FIBRILLATION ABLATION  06/30/2017  . ATRIAL  FIBRILLATION ABLATION N/A 06/30/2017   Procedure: Atrial Fibrillation Ablation;  Surgeon: Thompson Grayer, MD;  Location: [facility];  Service: Cardiovascular;  Laterality: N/A;  . BIOPSY THYROID  1996  . CARDIOVERSION N/A 12/07/2013   Procedure: CARDIOVERSION;  Surgeon: Casandra Doffing, MD;  Location: [facility];  Service: Cardiovascular;  Laterality: N/A;  . CARDIOVERSION N/A 03/10/2016   Procedure: CARDIOVERSION;  Surgeon: Thayer Headings, MD;  Location: [facility] ENDOSCOPY;  Service: Cardiovascular;  Laterality: N/A;  . ESOPHAGOGASTRODUODENOSCOPY N/A 12/07/2017   Procedure: ESOPHAGOGASTRODUODENOSCOPY (EGD);  Surgeon: Irene Shipper, MD;  Location: [facility] ENDOSCOPY;  Service: Endoscopy;  Laterality: N/A;  . EYE MUSCLE SURGERY Right ~ 1966  . TEE WITHOUT CARDIOVERSION N/A 06/29/2017   Procedure: TRANSESOPHAGEAL ECHOCARDIOGRAM (TEE);  Surgeon: Fay Records, MD;  Location: [facility] ENDOSCOPY;  Service: Cardiovascular;  Laterality: N/A;  . TONSILLECTOMY  1971    Current Outpatient Medications  Medication Sig Dispense Refill  . Blood Glucose Monitoring Suppl (ONE TOUCH ULTRA 2) w/Device KIT 1 application by Does not apply route 2 (two) times daily. 1 each 0  . clobetasol (TEMOVATE) 0.05 % external solution Apply small quarter size to affected area on scalp BID x 1 week then PRN (Patient taking differently: Apply 1 application topically daily as needed (scalp care). Apply small quarter size to affected area on scalp BID x 1 week then PRN) 50 mL 1  . diclofenac sodium (VOLTAREN) 1 % GEL Apply 2 g topically 4 (four) times daily as needed (pain).    Marland Kitchen glipiZIDE (GLUCOTROL) 5 MG tablet Take 1 tablet (5 mg total) by mouth 2 (two) times daily before a meal. 60 tablet 11  . glucose blood (ONE TOUCH TEST STRIPS) test strip Use as instructed 100 each 12  . Insulin Glargine (LANTUS SOLOSTAR) 100 UNIT/ML Solostar Pen Inject 60 Units into the skin daily. 3 mL 0  . Insulin Pen Needle (ULTICARE MICRO PEN NEEDLES) 32G X 4 MM MISC  1 applicator by Does not apply route at bedtime. 100 each 2  . JARDIANCE 10 MG TABS tablet TAKE 1 TABLET BY MOUTH DAILY. 30 tablet 2  . Lancets (ONETOUCH ULTRASOFT) lancets Use as instructed 100 each 12  . lisinopril (PRINIVIL,ZESTRIL) 10 MG tablet Take 0.5 tablets (5 mg total) by mouth daily. 90 tablet 3  . lovastatin (MEVACOR) 20 MG tablet TAKE 1 TABLET BY MOUTH AT BEDTIME. 90 tablet 0  . metFORMIN (GLUCOPHAGE) 1000 MG tablet Take 1 tablet (1,000 mg total) by mouth 2 (two) times daily with a meal. 60 tablet 2  . metoCLOPramide (REGLAN) 10 MG tablet Take 1 tablet (10 mg total) by mouth 3 (three) times daily before meals. 30 tablet 0  . metoprolol tartrate (LOPRESSOR) 25 MG tablet TAKE 1 & 1/2 TABLET BY MOUTH 2 TIMES DAILY (Patient taking differently: TAKE 1 & 1/2 TABLET(37.62m) BY MOUTH 2 TIMES DAILY) 270 tablet 2  .  omeprazole (PRILOSEC) 40 MG capsule Take 1 capsule (40 mg total) by mouth daily.    Marland Kitchen triamcinolone cream (KENALOG) 0.1 % Apply 1 application topically 2 (two) times daily as needed (rash).    Alveda Reasons 20 MG TABS tablet TAKE 1 TABLET BY MOUTH DAILY WITH SUPPER. (Patient taking differently: TAKE 1 TABLET (21m) BY MOUTH DAILY WITH SUPPER.) 30 tablet 10   No current facility-administered medications for this visit.     Allergies as of 12/18/2017 - Review Complete 12/18/2017  Allergen Reaction Noted  . Codeine Itching and Rash 10/25/2013    Family History  Adopted: Yes  Problem Relation Age of Onset  . Hypertension Mother   . Hyperlipidemia Mother     Social History   Socioeconomic History  . Marital status: Divorced    Spouse name: Not on file  . Number of children: 0  . Years of education: 186 . Highest education level: Not on file  Social Needs  . Financial resource strain: Not on file  . Food insecurity - worry: Not on file  . Food insecurity - inability: Not on file  . Transportation needs - medical: Not on file  . Transportation needs - non-medical: Not on  file  Occupational History  . Occupation: cChief Technology Officer Tobacco Use  . Smoking status: Former Smoker    Packs/day: 1.00    Years: 22.00    Pack years: 22.00    Types: Cigarettes    Last attempt to quit: 12/01/2010    Years since quitting: 7.0  . Smokeless tobacco: Never Used  Substance and Sexual Activity  . Alcohol use: Yes    Comment: 06/30/2017 "glass of wine maybe q other month"  . Drug use: No  . Sexual activity: Not Currently  Other Topics Concern  . Not on file  Social History Narrative   [place]     Fun: shop, sew, decorate   Denies any beliefs effecting healthcare    Review of Systems:    Constitutional: No weight loss, fever or chills Cardiovascular: No chest pain Respiratory: No SOB  Gastrointestinal: See HPI and otherwise negative   Physical Exam:  Vital signs: BP 114/80   Pulse 72   Ht '5\' 5"'  (1.651 m)   Wt 242 lb (109.8 kg)   BMI 40.27 kg/m    Constitutional:   Pleasant obese Caucasian female appears to be in NAD, Well developed, Well nourished, alert and cooperative Respiratory: Respirations even and unlabored. Lungs clear to auscultation bilaterally.   No wheezes, crackles, or rhonchi.  Cardiovascular: Normal S1, S2. No MRG. Regular rate and rhythm. No peripheral edema, cyanosis or pallor.  Gastrointestinal:  Soft, nondistended, nontender. No rebound or guarding. Normal bowel sounds. No appreciable masses or hepatomegaly. Psychiatric:  Demonstrates good judgement and reason without abnormal affect or behaviors.  RELEVANT LABS AND IMAGING: CBC    Component Value Date/Time   WBC 5.4 12/09/2017 0520   RBC 4.56 12/09/2017 0520   HGB 14.3 12/09/2017 0520   HGB 15.4 11/25/2017 1017   HCT 43.3 12/09/2017 0520   HCT 46.2 11/25/2017 1017   PLT 200 12/09/2017 0520   PLT 272 11/25/2017 1017   MCV 95.0 12/09/2017 0520   MCV 96 11/25/2017 1017   MCH 31.4 12/09/2017 0520   MCHC 33.0 12/09/2017 0520   RDW 12.8  12/09/2017 0520   RDW 13.7 11/25/2017 1017   LYMPHSABS 1.7 11/25/2017 1017   MONOABS 704 03/07/2016 0904  EOSABS 0.3 11/25/2017 1017   BASOSABS 0.1 11/25/2017 1017    CMP     Component Value Date/Time   NA 135 12/09/2017 0520   NA 143 11/25/2017 1017   K 3.6 12/09/2017 0520   CL 105 12/09/2017 0520   CO2 19 (L) 12/09/2017 0520   GLUCOSE 174 (H) 12/09/2017 0520   BUN 10 12/09/2017 0520   BUN 14 11/25/2017 1017   CREATININE 0.59 12/09/2017 0520   CREATININE 0.90 06/16/2016 1128   CALCIUM 8.7 (L) 12/09/2017 0520   PROT 6.9 12/06/2017 0428   PROT 7.2 11/25/2017 1017   ALBUMIN 3.3 (L) 12/06/2017 0428   ALBUMIN 4.2 11/25/2017 1017   AST 27 12/06/2017 0428   ALT 46 12/06/2017 0428   ALKPHOS 51 12/06/2017 0428   BILITOT 1.0 12/06/2017 0428   BILITOT 0.3 11/25/2017 1017   GFRNONAA >60 12/09/2017 0520   GFRNONAA 73 06/16/2016 1128   GFRAA >60 12/09/2017 0520   GFRAA 84 06/16/2016 1128   EXAM: NUCLEAR MEDICINE GASTRIC EMPTYING SCAN 12/08/17  TECHNIQUE: After oral ingestion of radiolabeled meal, sequential abdominal images were obtained for 4 hours. Percentage of activity emptying the stomach was calculated at 1 hour, 2 hour, 3 hour, and 4 hours.  RADIOPHARMACEUTICALS:  1.2 mCi Tc-76msulfur colloid in standardized meal including egg  COMPARISON:  None.  FINDINGS: Expected location of the stomach in the left upper quadrant. Ingested meal empties the stomach gradually over the course of the study.  3.1% emptied at 1 hr ( normal >= 10%)  0.2% emptied at 2 hr ( normal >= 40%)  59.6% emptied at 3 hr ( normal >= 70%)  98.2% emptied at 4 hr ( normal >= 90%)  IMPRESSION: There was marked delay in initiation of gastric emptying of solid material with virtually no emptying over the first 2 hours. Emptying remain delayed after 3 hours. By 4 hours post solid material consumption, virtually all material had emptied from the stomach.  These findings indicate that  given sufficient time, solid material will empty from the stomach.   Electronically Signed   By: WLowella GripIII M.D.   On: 12/08/2017 16:17  Assessment: 1.  Nausea and vomiting: Initially seen 12/04/17 by Dr. SFuller Plan arrange for EGD then proceeded to the ER with acute symptoms, CT abdomen pelvis negative other than known hepatic steatosis, EGD done normal, gastric emptying study showed delayed emptying as above, thought either related to acute gastroenteritis versus underlying diabetic gastroparesis, patient started on Reglan 10 mg 3 times daily, she titrated to twice daily and continues omeprazole 40 daily, symptoms have resolved 2.  Epigastric pain: With above  Plan: 1.  Discussed with the patient that it is okay for her to slowly titrate down on her Reglan.  She can try decreasing to 1 dose per day, with the knowledge that if she starts with symptoms again she should continue with twice daily dosing.  Did refill her Reglan 153mBID #60 with 3 refills 2.  Recommend the patient continue her Omeprazole 40 mg once daily for now.  She may be able to discontinue this in the future as well. 3.  Reviewed gastroparesis diet and lifestyle measures. 4.  Patient to follow in clinic with Dr. StFuller Plann 3 months.  At that time she can discuss continuation of Reglan plus/minus Omeprazole.  -JeEllouise NewerPA-C [facility] 12/18/2017, 10:05 AM  Cc: JoLadell PierMD

## 2017-12-18 NOTE — Progress Notes (Signed)
Reviewed and agree with initial management plan.  Loella Hickle T. Kyeisha Janowicz, MD FACG 

## 2017-12-21 ENCOUNTER — Encounter: Payer: Self-pay | Admitting: Internal Medicine

## 2017-12-21 ENCOUNTER — Ambulatory Visit: Payer: BLUE CROSS/BLUE SHIELD | Attending: Internal Medicine | Admitting: Internal Medicine

## 2017-12-21 VITALS — BP 128/87 | HR 60 | Temp 98.4°F | Resp 16 | Wt 239.8 lb

## 2017-12-21 DIAGNOSIS — Z794 Long term (current) use of insulin: Secondary | ICD-10-CM | POA: Diagnosis not present

## 2017-12-21 DIAGNOSIS — K3184 Gastroparesis: Secondary | ICD-10-CM | POA: Diagnosis not present

## 2017-12-21 DIAGNOSIS — Z7901 Long term (current) use of anticoagulants: Secondary | ICD-10-CM | POA: Insufficient documentation

## 2017-12-21 DIAGNOSIS — K920 Hematemesis: Secondary | ICD-10-CM | POA: Insufficient documentation

## 2017-12-21 DIAGNOSIS — I5022 Chronic systolic (congestive) heart failure: Secondary | ICD-10-CM | POA: Diagnosis not present

## 2017-12-21 DIAGNOSIS — E1143 Type 2 diabetes mellitus with diabetic autonomic (poly)neuropathy: Secondary | ICD-10-CM | POA: Insufficient documentation

## 2017-12-21 DIAGNOSIS — Z79899 Other long term (current) drug therapy: Secondary | ICD-10-CM | POA: Diagnosis not present

## 2017-12-21 DIAGNOSIS — M545 Low back pain: Secondary | ICD-10-CM | POA: Diagnosis not present

## 2017-12-21 DIAGNOSIS — E669 Obesity, unspecified: Secondary | ICD-10-CM | POA: Diagnosis not present

## 2017-12-21 DIAGNOSIS — I10 Essential (primary) hypertension: Secondary | ICD-10-CM

## 2017-12-21 DIAGNOSIS — Z87891 Personal history of nicotine dependence: Secondary | ICD-10-CM | POA: Insufficient documentation

## 2017-12-21 DIAGNOSIS — E785 Hyperlipidemia, unspecified: Secondary | ICD-10-CM | POA: Diagnosis not present

## 2017-12-21 DIAGNOSIS — I4891 Unspecified atrial fibrillation: Secondary | ICD-10-CM | POA: Diagnosis not present

## 2017-12-21 DIAGNOSIS — Z8673 Personal history of transient ischemic attack (TIA), and cerebral infarction without residual deficits: Secondary | ICD-10-CM | POA: Insufficient documentation

## 2017-12-21 DIAGNOSIS — I110 Hypertensive heart disease with heart failure: Secondary | ICD-10-CM | POA: Diagnosis not present

## 2017-12-21 DIAGNOSIS — Z6830 Body mass index (BMI) 30.0-30.9, adult: Secondary | ICD-10-CM | POA: Diagnosis not present

## 2017-12-21 DIAGNOSIS — E118 Type 2 diabetes mellitus with unspecified complications: Secondary | ICD-10-CM | POA: Diagnosis not present

## 2017-12-21 DIAGNOSIS — E559 Vitamin D deficiency, unspecified: Secondary | ICD-10-CM | POA: Diagnosis not present

## 2017-12-21 LAB — GLUCOSE, POCT (MANUAL RESULT ENTRY): POC Glucose: 123 mg/dl — AB (ref 70–99)

## 2017-12-21 LAB — POCT GLYCOSYLATED HEMOGLOBIN (HGB A1C): HEMOGLOBIN A1C: 7.1

## 2017-12-21 MED ORDER — LISINOPRIL 10 MG PO TABS: 5.0000 mg | ORAL_TABLET | Freq: Every day | ORAL | 3 refills | Status: DC

## 2017-12-21 MED ORDER — INSULIN GLARGINE 100 UNIT/ML SOLOSTAR PEN: 60.0000 [IU] | PEN_INJECTOR | Freq: Every day | SUBCUTANEOUS | 12 refills | Status: DC

## 2017-12-21 MED ORDER — METFORMIN HCL 1000 MG PO TABS: 1000.0000 mg | ORAL_TABLET | Freq: Two times a day (BID) | ORAL | 3 refills | Status: DC

## 2017-12-21 MED FILL — metFORMIN HCL 1000 MG TABS: 1000 | 30 days supply | Qty: 60 | Fill #0

## 2017-12-21 MED FILL — LISINOPRIL 10 MG TABS: 10 | 30 days supply | Qty: 15 | Fill #0

## 2017-12-21 MED FILL — LANTUS SOLOSTAR 100 UNITS/M: 100 | 25 days supply | Qty: 15 | Fill #0

## 2017-12-21 NOTE — Patient Instructions (Addendum)
After a few weeks, try decreasing Reglan to 5 mg twice a day with your two largest meals.   Gastroparesis Gastroparesis, also called delayed gastric emptying, is a condition in which food takes longer than normal to empty from the stomach. The condition is usually long-lasting (chronic). What are the causes? This condition may be caused by:  An endocrine disorder, such as hypothyroidism or diabetes. Diabetes is the most common cause of this condition.  A nervous system disease, such as Parkinson disease or multiple sclerosis.  Cancer, infection, or surgery of the stomach or vagus nerve.  A connective tissue disorder, such as scleroderma.  Certain medicines.  In most cases, the cause is not known. What increases the risk? This condition is more likely to develop in:  People with certain disorders, including endocrine disorders, eating disorders, amyloidosis, and scleroderma.  People with certain diseases, including Parkinson disease or multiple sclerosis.  People with cancer or infection of the stomach or vagus nerve.  People who have had surgery on the stomach or vagus nerve.  People who take certain medicines.  Women.  What are the signs or symptoms? Symptoms of this condition include:  An early feeling of fullness when eating.  Nausea.  Weight loss.  Vomiting.  Heartburn.  Abdominal bloating.  Inconsistent blood glucose levels.  Lack of appetite.  Acid from the stomach coming up into the esophagus (gastroesophageal reflux).  Spasms of the stomach.  Symptoms may come and go. How is this diagnosed? This condition is diagnosed with tests, such as:  Tests that check how long it takes food to move through the stomach and intestines. These tests include: ? Upper gastrointestinal (GI) series. In this test, X-rays of the intestines are taken after you drink a liquid. The liquid makes the intestines show up better on the X-rays. ? Gastric emptying scintigraphy.  In this test, scans are taken after you eat food that contains a small amount of radioactive material. ? Wireless capsule GI monitoring system. This test involves swallowing a capsule that records information about movement through the stomach.  Gastric manometry. This test measures electrical and muscular activity in the stomach. It is done with a thin tube that is passed down the throat and into the stomach.  Endoscopy. This test checks for abnormalities in the lining of the stomach. It is done with a long, thin tube that is passed down the throat and into the stomach.  An ultrasound. This test can help rule out gallbladder disease or pancreatitis as a cause of your symptoms. It uses sound waves to take pictures of the inside of your body.  How is this treated? There is no cure for gastroparesis. This condition may be managed with:  Treatment of the underlying condition causing the gastroparesis.  Lifestyle changes, including exercise and dietary changes. Dietary changes can include: ? Changes in what and when you eat. ? Eating smaller meals more often. ? Eating low-fat foods. ? Eating low-fiber forms of high-fiber foods, such as cooked vegetables instead of raw vegetables. ? Having liquid foods in place of solid foods. Liquid foods are easier to digest.  Medicines. These may be given to control nausea and vomiting and to stimulate stomach muscles.  Getting food through a feeding tube. This may be done in severe cases.  A gastric neurostimulator. This is a device that is inserted into the body with surgery. It helps improve stomach emptying and control nausea and vomiting.  Follow these instructions at home:  Follow your health  care provider's instructions about exercise and diet.  Take medicines only as directed by your health care provider. Contact a health care provider if:  Your symptoms do not improve with treatment.  You have new symptoms. Get help right away if:  You  have severe abdominal pain that does not improve with treatment.  You have nausea that does not go away.  You cannot keep fluids down. This information is not intended to replace advice given to you by your health care provider. Make sure you discuss any questions you have with your health care provider. Document Released: 11/17/2005 Document Revised: 04/24/2016 Document Reviewed: 11/13/2014 Elsevier Interactive Patient Education  [employer].

## 2017-12-21 NOTE — Progress Notes (Signed)
Patient ID: Kelly Lang, female    DOB: 04/22/1961  MRN: [id number]  CC: Hospitalization Follow-up   Subjective: Kelly Lang is a 57 y.o. female who presents for hosp f/u. Last saw me 08/2017. Her concerns today include:  Pt with hx of a.flutter s/p ablation 05/2017 on Xarelto, DM type 2 , obesity, HL, HTN, chronic systolic CHF with EF 53%, CVA. Patient hospitalized 1/5-08/2018 with N/V, hematemesis and abdominal pain.  Workup included GES that revealed delayed emptying consistent with gastroparesis.  EGD normal.  UGI B thought to be due to Mallory-Weiss tears in setting of cyclic vomiting.  Patient discharged on Reglan and Prilosec.  She is seen Dr. Lynne Leader PA in f/u 12/18/2017.  Patient was doing better.  She had decreased Reglan from 3 times daily to twice daily.  Today: -doing better.  Occasional feeling of butterfly in stomach -changed eating habits a lot. BF doing oatmeal or a shake.  Lunch - sandwich/soup.  Watching fiber intact. More cooked veggies  DM type 2 -Exercise: plans to join [employer]. Plans to go 3 x a wk -Meds: compliant BS a.m range 117-131; before dinner 90-100 -had eye exam 10/2017 at [facility].  No retinopathy. Patient Active Problem List   Diagnosis Date Noted  . Non-intractable cyclical vomiting with nausea   . Abdominal pain 12/06/2017  . GI bleed 12/06/2017  . Hepatic steatosis 12/04/2017  . Chronic systolic CHF (congestive heart failure) ([place]) 09/16/2015  . Cerebrovascular accident (CVA) due to embolism of left middle cerebral artery ([place])   . Thyroid mass 09/15/2015  . Low back pain 04/03/2014  . Unspecified vitamin D deficiency 01/20/2014  . Essential hypertension, benign 01/20/2014  . Chronic anticoagulation 10/31/2013  . Tachycardia induced cardiomyopathy ([place])   . H/O: hypothyroidism   . Obesity   . IDDM (insulin dependent diabetes mellitus) ([place])   . Arthritis   . Dyslipidemia   . Atrial fibrillation  10/25/2013     Current  Outpatient Medications on File Prior to Visit  Medication Sig Dispense Refill  . Blood Glucose Monitoring Suppl (ONE TOUCH ULTRA 2) w/Device KIT 1 application by Does not apply route 2 (two) times daily. 1 each 0  . clobetasol (TEMOVATE) 0.05 % external solution Apply small quarter size to affected area on scalp BID x 1 week then PRN (Patient taking differently: Apply 1 application topically daily as needed (scalp care). Apply small quarter size to affected area on scalp BID x 1 week then PRN) 50 mL 1  . diclofenac sodium (VOLTAREN) 1 % GEL Apply 2 g topically 4 (four) times daily as needed (pain).    Marland Kitchen glipiZIDE (GLUCOTROL) 5 MG tablet Take 1 tablet (5 mg total) by mouth 2 (two) times daily before a meal. 60 tablet 11  . glucose blood (ONE TOUCH TEST STRIPS) test strip Use as instructed 100 each 12  . Insulin Pen Needle (ULTICARE MICRO PEN NEEDLES) 32G X 4 MM MISC 1 applicator by Does not apply route at bedtime. 100 each 2  . JARDIANCE 10 MG TABS tablet TAKE 1 TABLET BY MOUTH DAILY. 30 tablet 2  . Lancets (ONETOUCH ULTRASOFT) lancets Use as instructed 100 each 12  . lovastatin (MEVACOR) 20 MG tablet TAKE 1 TABLET BY MOUTH AT BEDTIME. 90 tablet 0  . metoCLOPramide (REGLAN) 10 MG tablet Take 1 tablet (10 mg total) by mouth 2 (two) times daily. 60 tablet 2  . metoprolol tartrate (LOPRESSOR) 25 MG tablet TAKE 1 & 1/2 TABLET BY  MOUTH 2 TIMES DAILY (Patient taking differently: TAKE 1 & 1/2 TABLET(37.17m) BY MOUTH 2 TIMES DAILY) 270 tablet 2  . omeprazole (PRILOSEC) 40 MG capsule Take 1 capsule (40 mg total) by mouth daily.    .Marland Kitchentriamcinolone cream (KENALOG) 0.1 % Apply 1 application topically 2 (two) times daily as needed (rash).    .Alveda Reasons20 MG TABS tablet TAKE 1 TABLET BY MOUTH DAILY WITH SUPPER. (Patient taking differently: TAKE 1 TABLET (2105m BY MOUTH DAILY WITH SUPPER.) 30 tablet 10   No current facility-administered medications on file prior to visit.     Allergies  Allergen Reactions  .  Codeine Itching and Rash    Social History   Socioeconomic History  . Marital status: Divorced    Spouse name: Not on file  . Number of children: 0  . Years of education: 1470. Highest education level: Not on file  Social Needs  . Financial resource strain: Not on file  . Food insecurity - worry: Not on file  . Food insecurity - inability: Not on file  . Transportation needs - medical: Not on file  . Transportation needs - non-medical: Not on file  Occupational History  . Occupation: caChief Technology OfficerTobacco Use  . Smoking status: Former Smoker    Packs/day: 1.00    Years: 22.00    Pack years: 22.00    Types: Cigarettes    Last attempt to quit: 12/01/2010    Years since quitting: 7.0  . Smokeless tobacco: Never Used  Substance and Sexual Activity  . Alcohol use: Yes    Comment: 06/30/2017 "glass of wine maybe q other month"  . Drug use: No  . Sexual activity: Not Currently  Other Topics Concern  . Not on file  Social History Narrative   [place]     Fun: shop, sew, decorate   Denies any beliefs effecting healthcare    Family History  Adopted: Yes  Problem Relation Age of Onset  . Hypertension Mother   . Hyperlipidemia Mother     Past Surgical History:  Procedure Laterality Date  . ATRIAL FIBRILLATION ABLATION  06/30/2017  . ATRIAL FIBRILLATION ABLATION N/A 06/30/2017   Procedure: Atrial Fibrillation Ablation;  Surgeon: AlThompson GrayerMD;  [facility];  Service: Cardiovascular;  Laterality: N/A;  . BIOPSY THYROID  1996  . CARDIOVERSION N/A 12/07/2013   Procedure: CARDIOVERSION;  Surgeon: JaCasandra DoffingMD;  [facility]: [facility];  Laterality: N/A;  . CARDIOVERSION N/A 03/10/2016   Procedure: CARDIOVERSION;  Surgeon: PhThayer HeadingsMD;  [facility];  Service: Cardiovascular;  Laterality: N/A;  . ESOPHAGOGASTRODUODENOSCOPY N/A 12/07/2017   Procedure: ESOPHAGOGASTRODUODENOSCOPY  (EGD);  Surgeon: PeIrene ShipperMD;  [facility];  Service: Endoscopy;  Laterality: N/A;  . EYE MUSCLE SURGERY Right ~ 1966  . TEE WITHOUT CARDIOVERSION N/A 06/29/2017   Procedure: TRANSESOPHAGEAL ECHOCARDIOGRAM (TEE);  Surgeon: RoFay RecordsMD;  [facility];  Service: Cardiovascular;  Laterality: N/A;  . TONSILLECTOMY  1971    ROS: Review of Systems  PHYSICAL EXAM: BP 128/87   Pulse 60   Temp 98.4 F (36.9 C) (Oral)   Resp 16   Wt 239 lb 12.8 oz (108.8 kg)   SpO2 97%   BMI 39.90 kg/m   Wt Readings from Last 3 Encounters:  12/21/17 239 lb 12.8 oz (108.8 kg)  12/18/17 242 lb (109.8 kg)  12/09/17 235 lb  3.7 oz (106.7 kg)    Physical Exam  General appearance - alert, well appearing, and in no distress Mental status - alert, oriented to person, place, and time, normal mood, behavior, speech, dress, motor activity, and thought processes Neck - supple, no significant adenopathy Chest - clear to auscultation, no wheezes, rales or rhonchi, symmetric air entry Heart - normal rate, regular rhythm, normal S1, S2, no murmurs, rubs, clicks or gallops Extremities - peripheral pulses normal, no pedal edema, no clubbing or cyanosis Lab Results  Component Value Date   WBC 5.4 12/09/2017   HGB 14.3 12/09/2017   HCT 43.3 12/09/2017   MCV 95.0 12/09/2017   PLT 200 12/09/2017     Chemistry      Component Value Date/Time   NA 135 12/09/2017 0520   NA 143 11/25/2017 1017   K 3.6 12/09/2017 0520   CL 105 12/09/2017 0520   CO2 19 (L) 12/09/2017 0520   BUN 10 12/09/2017 0520   BUN 14 11/25/2017 1017   CREATININE 0.59 12/09/2017 0520   CREATININE 0.90 06/16/2016 1128      Component Value Date/Time   CALCIUM 8.7 (L) 12/09/2017 0520   ALKPHOS 51 12/06/2017 0428   AST 27 12/06/2017 0428   ALT 46 12/06/2017 0428   BILITOT 1.0 12/06/2017 0428   BILITOT 0.3 11/25/2017 1017      BS 123 A1C 7.1  ASSESSMENT AND PLAN: 1. Gastroparesis -Continue Reglan.  We  discussed some of the possible side effects of the medication with long-term use. -She will try cutting back to 5 mg twice a day  For [place] to try to find the lowest effective dose.  2. Controlled type 2 diabetes mellitus with complication, with long-term current use of insulin (HCC) Close to goal.  Continue current medications Commended her on change in eating habits. - POCT glucose (manual entry) - POCT glycosylated hemoglobin (Hb A1C) - Insulin Glargine (LANTUS SOLOSTAR) 100 UNIT/ML Solostar Pen; Inject 60 Units into the skin daily.  Dispense: 15 mL; Refill: 12 - metFORMIN (GLUCOPHAGE) 1000 MG tablet; Take 1 tablet (1,000 mg total) by mouth 2 (two) times daily with a meal.  Dispense: 180 tablet; Refill: 3  3. Essential hypertension At goal. - lisinopril (PRINIVIL,ZESTRIL) 10 MG tablet; Take 0.5 tablets (5 mg total) by mouth daily.  Dispense: 45 tablet; Refill: 3  4. Obesity (BMI 30-39.9) Commended her on changes in eating habits. Encourage her to follow through with going to water aerobics several times a week.  Patient was given the opportunity to ask questions.  Patient verbalized understanding of the plan and was able to repeat key elements of the plan.   Orders Placed This Encounter  Procedures  . POCT glucose (manual entry)  . POCT glycosylated hemoglobin (Hb A1C)     Requested Prescriptions   Signed Prescriptions Disp Refills  . Insulin Glargine (LANTUS SOLOSTAR) 100 UNIT/ML Solostar Pen 15 mL 12    Sig: Inject 60 Units into the skin daily.  Marland Kitchen lisinopril (PRINIVIL,ZESTRIL) 10 MG tablet 45 tablet 3    Sig: Take 0.5 tablets (5 mg total) by mouth daily.  . metFORMIN (GLUCOPHAGE) 1000 MG tablet 180 tablet 3    Sig: Take 1 tablet (1,000 mg total) by mouth 2 (two) times daily with a meal.    Return in about 3 months (around 03/21/2018).  Karle Plumber, MD, FACP

## 2017-12-23 ENCOUNTER — Ambulatory Visit: Payer: BLUE CROSS/BLUE SHIELD | Admitting: Gastroenterology

## 2017-12-24 MED FILL — glipiZIDE 5 MG TABS: 5 | 30 days supply | Qty: 60 | Fill #6

## 2017-12-28 ENCOUNTER — Other Ambulatory Visit: Payer: Self-pay

## 2017-12-28 ENCOUNTER — Ambulatory Visit (HOSPITAL_COMMUNITY): Payer: BLUE CROSS/BLUE SHIELD | Attending: Cardiology

## 2017-12-28 DIAGNOSIS — E669 Obesity, unspecified: Secondary | ICD-10-CM | POA: Insufficient documentation

## 2017-12-28 DIAGNOSIS — I429 Cardiomyopathy, unspecified: Secondary | ICD-10-CM | POA: Insufficient documentation

## 2017-12-28 DIAGNOSIS — E119 Type 2 diabetes mellitus without complications: Secondary | ICD-10-CM | POA: Insufficient documentation

## 2017-12-28 DIAGNOSIS — I4891 Unspecified atrial fibrillation: Secondary | ICD-10-CM | POA: Insufficient documentation

## 2017-12-28 DIAGNOSIS — E785 Hyperlipidemia, unspecified: Secondary | ICD-10-CM | POA: Insufficient documentation

## 2017-12-28 DIAGNOSIS — I071 Rheumatic tricuspid insufficiency: Secondary | ICD-10-CM | POA: Insufficient documentation

## 2017-12-28 DIAGNOSIS — I10 Essential (primary) hypertension: Secondary | ICD-10-CM | POA: Diagnosis not present

## 2017-12-31 MED FILL — LOVASTATIN 20 MG TABLET: 20 | 30 days supply | Qty: 30 | Fill #1

## 2017-12-31 MED FILL — XARELTO 20 MG TABLET: 20 | 30 days supply | Qty: 30 | Fill #3

## 2018-01-04 ENCOUNTER — Encounter: Payer: Self-pay | Admitting: Internal Medicine

## 2018-01-04 ENCOUNTER — Ambulatory Visit: Payer: BLUE CROSS/BLUE SHIELD | Admitting: Internal Medicine

## 2018-01-04 VITALS — BP 116/78 | HR 75 | Ht 65.0 in | Wt 241.0 lb

## 2018-01-04 DIAGNOSIS — I428 Other cardiomyopathies: Secondary | ICD-10-CM | POA: Diagnosis not present

## 2018-01-04 DIAGNOSIS — I4891 Unspecified atrial fibrillation: Secondary | ICD-10-CM | POA: Diagnosis not present

## 2018-01-04 DIAGNOSIS — I10 Essential (primary) hypertension: Secondary | ICD-10-CM | POA: Diagnosis not present

## 2018-01-04 NOTE — Patient Instructions (Addendum)
Medication Instructions:  Your physician recommends that you continue on your current medications as directed. Please refer to the Current Medication list given to you today.  Labwork: None ordered.  Testing/Procedures: None ordered.  Follow-Up: Follow up with Dr. Elease Hashimoto in 3 months.  Your physician wants you to follow-up in: 6 months with Dr. Johney Frame.   You will receive a reminder letter in the mail two months in advance. If you don't receive a letter, please call our office to schedule the follow-up appointment.  Any Other Special Instructions Will Be Listed Below (If Applicable).  If you need a refill on your cardiac medications before your next appointment, please call your pharmacy.

## 2018-01-04 NOTE — Progress Notes (Signed)
PCP: Ladell Pier, MD Primary Cardiologist: Dr Acie Fredrickson Primary EP: Dr Drucie Ip Kelly Lang is a 57 y.o. female who presents today for routine electrophysiology followup.  Since last being seen in our clinic, the patient reports doing very well.  She did have GI symptoms in early January for which she underwent workup (reviewed personally) and was found to have gastroparesis.  No afib.  Today, she denies symptoms of palpitations, chest pain, shortness of breath,  lower extremity edema, dizziness, presyncope, or syncope.  The patient is otherwise without complaint today.   Past Medical History:  Diagnosis Date  . Arthritis    "all over" (06/30/2017)  . Chicken pox   . Dyslipidemia   . H/O: hypothyroidism    a. thyroid biopsy 1996 with synthroid. Stopped taking rx and was loss to follow up b. normal thyroid function 10/20/13  . High cholesterol   . Hx of cardiovascular stress test    ETT/Lexiscan Myoview (12/2013):  No ischemia; not gated; low risk.  Marland Kitchen Hypertension   . Obesity   . Persistent atrial fibrillation ([place])    a diagnosed 10/25/2013  . Seasonal allergies   . Stroke [facility]) 2016   "some speech problems since" (06/30/2017)  . 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 2/2 Afib with RVR for unknown duration (EF: 40-45%)  . Thyroid disease   . Type II diabetes mellitus ([place])    a. hg A1c 10, newly diagnosed (10/26/13)   Past Surgical History:  Procedure Laterality Date  . ATRIAL FIBRILLATION ABLATION  06/30/2017  . ATRIAL FIBRILLATION ABLATION N/A 06/30/2017   Procedure: Atrial Fibrillation Ablation;  Surgeon: Thompson Grayer, MD;  Location: [place] [facility] LAB;  Service: Cardiovascular;  Laterality: N/A;  . BIOPSY THYROID  1996  . CARDIOVERSION N/A 12/07/2013   Procedure: CARDIOVERSION;  Surgeon: Casandra Doffing, MD;  Location: [place];  Service: Cardiovascular;  Laterality: N/A;  . CARDIOVERSION N/A 03/10/2016   Procedure: CARDIOVERSION;  Surgeon: Thayer Headings, MD;   [facility];  Service: Cardiovascular;  Laterality: N/A;  . ESOPHAGOGASTRODUODENOSCOPY N/A 12/07/2017   Procedure: ESOPHAGOGASTRODUODENOSCOPY (EGD);  Surgeon: Irene Shipper, MD;  Location: [facility];  Service: Endoscopy;  Laterality: N/A;  . EYE MUSCLE SURGERY Right ~ 1966  . TEE WITHOUT CARDIOVERSION N/A 06/29/2017   Procedure: TRANSESOPHAGEAL ECHOCARDIOGRAM (TEE);  Surgeon: Fay Records, MD;  [facility]COPY;  Service: Cardiovascular;  Laterality: N/A;  . [place] are reviewed and negatives except as per HPI above  Current Outpatient Medications  Medication Sig Dispense Refill  . Blood Glucose Monitoring Suppl (ONE TOUCH ULTRA 2) w/Device KIT 1 application by Does not apply route 2 (two) times daily. 1 each 0  . clobetasol (TEMOVATE) 0.05 % external solution Apply small quarter size to affected area on scalp BID x 1 week then PRN (Patient taking differently: Apply 1 application topically daily as needed (scalp care). Apply small quarter size to affected area on scalp BID x 1 week then PRN) 50 mL 1  . diclofenac sodium (VOLTAREN) 1 % GEL Apply 2 g topically 4 (four) times daily as needed (pain).    Marland Kitchen glipiZIDE (GLUCOTROL) 5 MG tablet Take 1 tablet (5 mg total) by mouth 2 (two) times daily before a meal. 60 tablet 11  . glucose blood (ONE TOUCH TEST STRIPS) test strip Use as instructed 100 each 12  . Insulin Glargine (LANTUS SOLOSTAR) 100 UNIT/ML Solostar Pen Inject 60 Units  into the skin daily. 15 mL 12  . Insulin Pen Needle (ULTICARE MICRO PEN NEEDLES) 32G X 4 MM MISC 1 applicator by Does not apply route at bedtime. 100 each 2  . JARDIANCE 10 MG TABS tablet TAKE 1 TABLET BY MOUTH DAILY. 30 tablet 2  . Lancets (ONETOUCH ULTRASOFT) lancets Use as instructed 100 each 12  . lisinopril (PRINIVIL,ZESTRIL) 10 MG tablet Take 0.5 tablets (5 mg total) by mouth daily. 45 tablet 3  . lovastatin (MEVACOR) 20 MG tablet TAKE 1 TABLET BY MOUTH AT BEDTIME.  90 tablet 0  . metFORMIN (GLUCOPHAGE) 1000 MG tablet Take 1 tablet (1,000 mg total) by mouth 2 (two) times daily with a meal. 180 tablet 3  . metoCLOPramide (REGLAN) 10 MG tablet Take 1 tablet (10 mg total) by mouth 2 (two) times daily. (Patient taking differently: Take 10 mg by mouth daily as needed. ) 60 tablet 2  . metoprolol tartrate (LOPRESSOR) 25 MG tablet TAKE 1 & 1/2 TABLET BY MOUTH 2 TIMES DAILY (Patient taking differently: TAKE 1 & 1/2 TABLET(37.57m) BY MOUTH 2 TIMES DAILY) 270 tablet 2  . omeprazole (PRILOSEC) 40 MG capsule Take 1 capsule (40 mg total) by mouth daily.    .Marland Kitchentriamcinolone cream (KENALOG) 0.1 % Apply 1 application topically 2 (two) times daily as needed (rash).    .Alveda Reasons20 MG TABS tablet TAKE 1 TABLET BY MOUTH DAILY WITH SUPPER. (Patient taking differently: TAKE 1 TABLET (275m BY MOUTH DAILY WITH SUPPER.) 30 tablet 10   No current facility-administered medications for this visit.     Physical Exam: Vitals:   01/04/18 0933  BP: 116/78  Pulse: 75  Weight: 241 lb (109.3 kg)  Height: _0  (1.651 m)    GEN- The patient is well appearing, alert and oriented x 3 today.   Head- normocephalic, atraumatic Eyes-  Sclera clear, conjunctiva pink Ears- hearing intact Oropharynx- clear Lungs- Clear to ausculation bilaterally, normal work of breathing Heart- Regular rate and rhythm, no murmurs, rubs or gallops, PMI not laterally displaced GI- soft, NT, ND, + BS Extremities- no clubbing, cyanosis, or edema  EKG tracing ordered today is personally reviewed and shows sinus rhythm 75 bpm, PR 172 msec, QRS 90 msec, Qtc 453 msec, nonspecific St/T changes similar to prior ekgs  Assessment and Plan:  1. Persistent afib Maintaining sinus rhythm post ablation off AAD therapy chads2vasc score is 6 (includes prior stroke),  Continue long term anticoagulation  2. Obesity Body mass index is 40.1 kg/m. Lifestyle modification encouraged  3. HTN Stable No change required  today  4. Nonischemic CM EF has normalized with sinus rhythm post ablation  Return to see me in 6 months Follow-up with Dr NaAcie Fredricksons scheduled  JaThompson GrayerD, FAButler Memorial Hospital/03/2018 9:53 AM

## 2018-01-11 ENCOUNTER — Ambulatory Visit: Payer: BLUE CROSS/BLUE SHIELD | Admitting: Internal Medicine

## 2018-01-14 MED FILL — METOPROLOL TARTRATE 25 MG T: 25 | 30 days supply | Qty: 90 | Fill #3

## 2018-01-14 MED FILL — JARDIANCE 10 MG TABLET: 10 | 30 days supply | Qty: 30 | Fill #2

## 2018-01-14 MED FILL — LANTUS SOLOSTAR 100 UNITS/M: 100 | 25 days supply | Qty: 15 | Fill #1

## 2018-02-01 MED FILL — glipiZIDE 5 MG TABS: 5 | 30 days supply | Qty: 60 | Fill #7

## 2018-02-01 MED FILL — LISINOPRIL 10 MG TABS: 10 | 30 days supply | Qty: 15 | Fill #1

## 2018-02-01 MED FILL — metFORMIN HCL 1000 MG TABS: 1000 | 30 days supply | Qty: 60 | Fill #1

## 2018-02-01 MED FILL — LOVASTATIN 20 MG TABS: 20 | 30 days supply | Qty: 30 | Fill #2

## 2018-02-01 MED FILL — XARELTO 20 MG TABLET: 20 | 30 days supply | Qty: 30 | Fill #4

## 2018-02-08 MED FILL — METOPROLOL TARTRATE 25 MG T: 25 | 30 days supply | Qty: 90 | Fill #4

## 2018-02-08 MED FILL — LANTUS SOLOSTAR 100 UNITS/M: 100 | 25 days supply | Qty: 15 | Fill #2

## 2018-02-08 MED FILL — JARDIANCE 10 MG TABLET: 10 | 30 days supply | Qty: 30 | Fill #0

## 2018-02-26 ENCOUNTER — Encounter: Payer: Self-pay | Admitting: Gastroenterology

## 2018-03-01 ENCOUNTER — Other Ambulatory Visit: Payer: Self-pay | Admitting: Internal Medicine

## 2018-03-01 MED FILL — XARELTO 20 MG TABLET: 20 | 30 days supply | Qty: 30 | Fill #5

## 2018-03-01 MED FILL — metFORMIN HCL 1000 MG TABS: 1000 | 30 days supply | Qty: 60 | Fill #2

## 2018-03-01 MED FILL — glipiZIDE 5 MG TABS: 5 | 30 days supply | Qty: 60 | Fill #8

## 2018-03-01 MED FILL — LISINOPRIL 10 MG TABS: 10 | 30 days supply | Qty: 15 | Fill #2

## 2018-03-08 ENCOUNTER — Other Ambulatory Visit: Payer: Self-pay | Admitting: Internal Medicine

## 2018-03-08 MED FILL — LANTUS SOLOSTAR 100 UNITS/M: 100 | 25 days supply | Qty: 15 | Fill #3

## 2018-03-08 MED FILL — JARDIANCE 10 MG TABLET: 10 | 30 days supply | Qty: 30 | Fill #1

## 2018-03-08 MED FILL — METOPROLOL TARTRATE 25 MG T: 25 | 30 days supply | Qty: 90 | Fill #5

## 2018-03-08 MED FILL — LOVASTATIN 20 MG TABS: 20 | 30 days supply | Qty: 30 | Fill #0

## 2018-03-31 ENCOUNTER — Encounter: Payer: Self-pay | Admitting: Physician Assistant

## 2018-03-31 ENCOUNTER — Other Ambulatory Visit: Payer: Self-pay

## 2018-03-31 ENCOUNTER — Telehealth: Payer: Self-pay | Admitting: Internal Medicine

## 2018-03-31 ENCOUNTER — Ambulatory Visit: Payer: BLUE CROSS/BLUE SHIELD | Attending: Internal Medicine | Admitting: Physician Assistant

## 2018-03-31 VITALS — BP 135/91 | HR 75 | Temp 98.5°F | Resp 16 | Ht 65.0 in | Wt 246.0 lb

## 2018-03-31 DIAGNOSIS — I481 Persistent atrial fibrillation: Secondary | ICD-10-CM | POA: Insufficient documentation

## 2018-03-31 DIAGNOSIS — Z8673 Personal history of transient ischemic attack (TIA), and cerebral infarction without residual deficits: Secondary | ICD-10-CM | POA: Diagnosis not present

## 2018-03-31 DIAGNOSIS — I10 Essential (primary) hypertension: Secondary | ICD-10-CM | POA: Diagnosis not present

## 2018-03-31 DIAGNOSIS — I429 Cardiomyopathy, unspecified: Secondary | ICD-10-CM | POA: Insufficient documentation

## 2018-03-31 DIAGNOSIS — Z794 Long term (current) use of insulin: Secondary | ICD-10-CM | POA: Diagnosis not present

## 2018-03-31 DIAGNOSIS — E118 Type 2 diabetes mellitus with unspecified complications: Secondary | ICD-10-CM

## 2018-03-31 DIAGNOSIS — E669 Obesity, unspecified: Secondary | ICD-10-CM | POA: Insufficient documentation

## 2018-03-31 DIAGNOSIS — L309 Dermatitis, unspecified: Secondary | ICD-10-CM | POA: Insufficient documentation

## 2018-03-31 DIAGNOSIS — Z79899 Other long term (current) drug therapy: Secondary | ICD-10-CM | POA: Insufficient documentation

## 2018-03-31 DIAGNOSIS — E7800 Pure hypercholesterolemia, unspecified: Secondary | ICD-10-CM | POA: Insufficient documentation

## 2018-03-31 DIAGNOSIS — Z885 Allergy status to narcotic agent status: Secondary | ICD-10-CM | POA: Diagnosis not present

## 2018-03-31 DIAGNOSIS — Z7901 Long term (current) use of anticoagulants: Secondary | ICD-10-CM | POA: Diagnosis not present

## 2018-03-31 LAB — GLUCOSE, POCT (MANUAL RESULT ENTRY): POC GLUCOSE: 210 mg/dL — AB (ref 70–99)

## 2018-03-31 LAB — POCT GLYCOSYLATED HEMOGLOBIN (HGB A1C): HEMOGLOBIN A1C: 7.3

## 2018-03-31 MED ORDER — GLIPIZIDE 10 MG PO TABS: 10.0000 mg | ORAL_TABLET | Freq: Two times a day (BID) | ORAL | 3 refills | Status: DC

## 2018-03-31 MED ORDER — CLOBETASOL PROPIONATE 0.05 % EX SOLN: 1.0000 "application " | Freq: Every day | CUTANEOUS | 3 refills | Status: DC | PRN

## 2018-03-31 MED FILL — CLOBETASOL 0.05% SOLUTION: 0.05 | 25 days supply | Qty: 50 | Fill #0

## 2018-03-31 MED FILL — glipiZIDE 10 MG TABS: 10 | 30 days supply | Qty: 60 | Fill #0

## 2018-03-31 NOTE — Patient Instructions (Signed)
Check blood sugar fasting and at bedtime and record and bring to your next visit.   Diabetes Mellitus and Nutrition When you have diabetes (diabetes mellitus), it is very important to have healthy eating habits because your blood sugar (glucose) levels are greatly affected by what you eat and drink. Eating healthy foods in the appropriate amounts, at about the same times every day, can help you:  Control your blood glucose.  Lower your risk of heart disease.  Improve your blood pressure.  Reach or maintain a healthy weight.  Every person with diabetes is different, and each person has different needs for a meal plan. Your health care provider may recommend that you work with a diet and nutrition specialist (dietitian) to make a meal plan that is best for you. Your meal plan may vary depending on factors such as:  The calories you need.  The medicines you take.  Your weight.  Your blood glucose, blood pressure, and cholesterol levels.  Your activity level.  Other health conditions you have, such as heart or kidney disease.  How do carbohydrates affect me? Carbohydrates affect your blood glucose level more than any other type of food. Eating carbohydrates naturally increases the amount of glucose in your blood. Carbohydrate counting is a method for keeping track of how many carbohydrates you eat. Counting carbohydrates is important to keep your blood glucose at a healthy level, especially if you use insulin or take certain oral diabetes medicines. It is important to know how many carbohydrates you can safely have in each meal. This is different for every person. Your dietitian can help you calculate how many carbohydrates you should have at each meal and for snack. Foods that contain carbohydrates include:  Bread, cereal, rice, pasta, and crackers.  Potatoes and corn.  Peas, beans, and lentils.  Milk and yogurt.  Fruit and juice.  Desserts, such as cakes, cookies, ice cream,  and candy.  How does alcohol affect me? Alcohol can cause a sudden decrease in blood glucose (hypoglycemia), especially if you use insulin or take certain oral diabetes medicines. Hypoglycemia can be a life-threatening condition. Symptoms of hypoglycemia (sleepiness, dizziness, and confusion) are similar to symptoms of having too much alcohol. If your health care provider says that alcohol is safe for you, follow these guidelines:  Limit alcohol intake to no more than 1 drink per day for nonpregnant women and 2 drinks per day for men. One drink equals 12 oz of beer, 5 oz of wine, or 1 oz of hard liquor.  Do not drink on an empty stomach.  Keep yourself hydrated with water, diet soda, or unsweetened iced tea.  Keep in mind that regular soda, juice, and other mixers may contain a lot of sugar and must be counted as carbohydrates.  What are tips for following this plan? Reading food labels  Start by checking the serving size on the label. The amount of calories, carbohydrates, fats, and other nutrients listed on the label are based on one serving of the food. Many foods contain more than one serving per package.  Check the total grams (g) of carbohydrates in one serving. You can calculate the number of servings of carbohydrates in one serving by dividing the total carbohydrates by 15. For example, if a food has 30 g of total carbohydrates, it would be equal to 2 servings of carbohydrates.  Check the number of grams (g) of saturated and trans fats in one serving. Choose foods that have low or no amount  of these fats.  Check the number of milligrams (mg) of sodium in one serving. Most people should limit total sodium intake to less than 2,300 mg per day.  Always check the nutrition information of foods labeled as "low-fat" or "nonfat". These foods may be higher in added sugar or refined carbohydrates and should be avoided.  Talk to your dietitian to identify your daily goals for nutrients  listed on the label. Shopping  Avoid buying canned, premade, or processed foods. These foods tend to be high in fat, sodium, and added sugar.  Shop around the outside edge of the grocery store. This includes fresh fruits and vegetables, bulk grains, fresh meats, and fresh dairy. Cooking  Use low-heat cooking methods, such as baking, instead of high-heat cooking methods like deep frying.  Cook using healthy oils, such as olive, canola, or sunflower oil.  Avoid cooking with butter, cream, or high-fat meats. Meal planning  Eat meals and snacks regularly, preferably at the same times every day. Avoid going long periods of time without eating.  Eat foods high in fiber, such as fresh fruits, vegetables, beans, and whole grains. Talk to your dietitian about how many servings of carbohydrates you can eat at each meal.  Eat 4-6 ounces of lean protein each day, such as lean meat, chicken, fish, eggs, or tofu. 1 ounce is equal to 1 ounce of meat, chicken, or fish, 1 egg, or 1/4 cup of tofu.  Eat some foods each day that contain healthy fats, such as avocado, nuts, seeds, and fish. Lifestyle   Check your blood glucose regularly.  Exercise at least 30 minutes 5 or more days each week, or as told by your health care provider.  Take medicines as told by your health care provider.  Do not use any products that contain nicotine or tobacco, such as cigarettes and e-cigarettes. If you need help quitting, ask your health care provider.  Work with a Social worker or diabetes educator to identify strategies to manage stress and any emotional and social challenges. What are some questions to ask my health care provider?  Do I need to meet with a diabetes educator?  Do I need to meet with a dietitian?  What number can I call if I have questions?  When are the best times to check my blood glucose? Where to find more information:  American Diabetes Association:  diabetes.org/food-and-fitness/food  Academy of Nutrition and Dietetics: PokerClues.dk  [employer] of Diabetes and Digestive and Kidney Diseases (NIH): ContactWire.be Summary  A healthy meal plan will help you control your blood glucose and maintain a healthy lifestyle.  Working with a diet and nutrition specialist (dietitian) can help you make a meal plan that is best for you.  Keep in mind that carbohydrates and alcohol have immediate effects on your blood glucose levels. It is important to count carbohydrates and to use alcohol carefully. This information is not intended to replace advice given to you by your health care provider. Make sure you discuss any questions you have with your health care provider. Document Released: 08/14/2005 Document Revised: 12/22/2016 Document Reviewed: 12/22/2016 Elsevier Interactive Patient Education  [employer].

## 2018-03-31 NOTE — Progress Notes (Signed)
Concerns psoriasis exacerbation

## 2018-03-31 NOTE — Progress Notes (Signed)
Kelly Lang, is a 57 y.o. female  AOZ:[id number]  NGE:[id number]  DOB - 1961/02/04  Subjective:  Chief Complaint and HPI: Kelly Lang is a 57 y.o. female here today  for worsening skin condition. The temovate seems to help but she is almost out.  Wants derm referral.  Now in addition to rash on scalp, she has pruritic lesions on L upper thigh, R leg and B elbows.  No f/c.  No tick bites.    Blood sugars running from 130-180.  Compliant with meds.   ROS:   Constitutional:  No f/c, No night sweats, No unexplained weight loss. EENT:  No vision changes, No blurry vision, No hearing changes. No mouth, throat, or ear problems.  Respiratory: No cough, No SOB Cardiac: No CP, no palpitations GI:  No abd pain, No N/V/D. GU: No Urinary s/sx Musculoskeletal: No joint pain Neuro: No headache, no dizziness, no motor weakness.  Skin: + rash Endocrine:  No polydipsia. No polyuria.  Psych: Denies SI/HI  No problems updated.  ALLERGIES: Allergies  Allergen Reactions  . Codeine Itching and Rash    PAST MEDICAL HISTORY: Past Medical History:  Diagnosis Date  . Arthritis    "all over" (06/30/2017)  . Chicken pox   . Dyslipidemia   . H/O: hypothyroidism    a. thyroid biopsy 1996 with synthroid. Stopped taking rx and was loss to follow up b. normal thyroid function 10/20/13  . High cholesterol   . Hx of cardiovascular stress test    ETT/Lexiscan Myoview (12/2013):  No ischemia; not gated; low risk.  Marland Kitchen Hypertension   . Obesity   . Persistent atrial fibrillation ([place])    a diagnosed 10/25/2013  . Seasonal allergies   . Stroke [facility]) 2016   "some speech problems since" (06/30/2017)  . 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 2/2 Afib with RVR for unknown duration (EF: 40-45%)  . Thyroid disease   . Type II diabetes mellitus ([place])    a. hg A1c 10, newly diagnosed (10/26/13)    MEDICATIONS AT HOME: Prior to Admission medications   Medication Sig Start Date End Date Taking?  Authorizing Provider  diclofenac sodium (VOLTAREN) 1 % GEL Apply 2 g topically 4 (four) times daily as needed (pain). 12/09/17  Yes Hongalgi, Lenis Dickinson, MD  Insulin Glargine (LANTUS SOLOSTAR) 100 UNIT/ML Solostar Pen Inject 60 Units into the skin daily. 12/21/17  Yes Ladell Pier, MD  JARDIANCE 10 MG TABS tablet TAKE 1 TABLET BY MOUTH DAILY. 11/11/17  Yes Ladell Pier, MD  lisinopril (PRINIVIL,ZESTRIL) 10 MG tablet Take 0.5 tablets (5 mg total) by mouth daily. 12/21/17  Yes Ladell Pier, MD  lovastatin (MEVACOR) 20 MG tablet TAKE 1 TABLET BY MOUTH AT BEDTIME. 03/08/18  Yes Ladell Pier, MD  metFORMIN (GLUCOPHAGE) 1000 MG tablet Take 1 tablet (1,000 mg total) by mouth 2 (two) times daily with a meal. 12/21/17  Yes Ladell Pier, MD  metoCLOPramide (REGLAN) 10 MG tablet Take 1 tablet (10 mg total) by mouth 2 (two) times daily. Patient taking differently: Take 10 mg by mouth daily as needed.  12/18/17  Yes Levin Erp, PA  metoprolol tartrate (LOPRESSOR) 25 MG tablet TAKE 1 & 1/2 TABLET BY MOUTH 2 TIMES DAILY Patient taking differently: TAKE 1 & 1/2 TABLET(37.19m) BY MOUTH 2 TIMES DAILY 09/21/17  Yes Nahser, PWonda Cheng MD  omeprazole (PRILOSEC) 40 MG capsule Take 1 capsule (40 mg total) by mouth daily. 12/09/17  Yes Hongalgi,  Lenis Dickinson, MD  triamcinolone cream (KENALOG) 0.1 % Apply 1 application topically 2 (two) times daily as needed (rash). 12/09/17  Yes Hongalgi, Lenis Dickinson, MD  XARELTO 20 MG TABS tablet TAKE 1 TABLET BY MOUTH DAILY WITH SUPPER. Patient taking differently: TAKE 1 TABLET (55m) BY MOUTH DAILY WITH SUPPER. 09/22/17  Yes Allred, JJeneen Rinks MD  Blood Glucose Monitoring Suppl (ONE TOUCH ULTRA 2) w/Device KIT 1 application by Does not apply route 2 (two) times daily. 11/19/16   Langeland, DLeda Quail MD  clobetasol (TEMOVATE) 0.05 % external solution Apply 1 application topically daily as needed (scalp care). Apply small quarter size to affected area on scalp BID x 1 week then  PRN 03/31/18   MArgentina Donovan PA-C  glipiZIDE (GLUCOTROL) 10 MG tablet Take 1 tablet (10 mg total) by mouth 2 (two) times daily before a meal. 03/31/18   Devota Viruet, ADionne Bucy PA-C  glucose blood (ONE TOUCH TEST STRIPS) test strip Use as instructed 11/19/16   LLottie MusselT, MD  Insulin Pen Needle (ULTICARE MICRO PEN NEEDLES) 32G X 4 MM MISC 1 applicator by Does not apply route at bedtime. 11/19/16   LMaren Reamer MD  Lancets ([facility]) lancets Use as instructed 11/19/16   LLottie MusselT, MD     Objective:  EXAM:   Vitals:   03/31/18 1612  BP: (!) 135/91  Pulse: 75  Resp: 16  Temp: 98.5 F (36.9 C)  TempSrc: Oral  SpO2: 98%  Weight: 246 lb (111.6 kg)  Height: _0  (1.651 m)    General appearance : A&OX3. NAD. Non-toxic-appearing HEENT: Atraumatic and Normocephalic.  PERRLA. EOM intact.   Neck: supple, no JVD. No cervical lymphadenopathy. No thyromegaly Chest/Lungs:  Breathing-non-labored, Good air entry bilaterally, breath sounds normal without rales, rhonchi, or wheezing  CVS: S1 S2 regular, no murmurs, gallops, rubs  Extremities: Bilateral Lower Ext shows no edema, both legs are warm to touch with = pulse throughout Neurology:  CN II-XII grossly intact, Non focal.   Psych:  TP linear. J/I limited as related to diet/DM. Loud speech. Appropriate eye contact and affect.  Skin:  Erythematous rash L occipital and parietal scalp.  Smaller lesions on B elbows, L upper thigh and R lower leg.  Either appear like patches of eczema vs psoriasis vs combination  Data Review Lab Results  Component Value Date   HGBA1C 7.3 03/31/2018   HGBA1C 7.1 12/21/2017   HGBA1C 7.0 09/07/2017     Assessment & Plan   1. Dermatitis - clobetasol (TEMOVATE) 0.05 % external solution; Apply 1 application topically daily as needed (scalp care). Apply small quarter size to affected area on scalp BID x 1 week then PRN  Dispense: 50 mL; Refill: 3 - Ambulatory referral to  Dermatology  2. Type 2 diabetes mellitus with complication, with long-term current use of insulin (HCC) Uncontrolled.  Increase glucotrol to 188mbid with food.  Continue lantus and metformin as is.  Check blood sugars fasting and at bed time and record and bring to next visit.   - Glucose (CBG) - HgB A1c   Patient have been counseled extensively about nutrition and exercise  Return for keep 5/30 appt with Dr JoWynetta Emery The patient was given clear instructions to go to ER or return to medical center if symptoms don't improve, worsen or new problems develop. The patient verbalized understanding. The patient was told to call to get lab results if they haven't heard anything in the next week.  Freeman Caldron, PA-C [facility] [place], [place]   03/31/2018, 4:35 PM

## 2018-04-01 MED FILL — LANTUS SOLOSTAR 100 UNITS/M: 100 | 25 days supply | Qty: 15 | Fill #4

## 2018-04-01 MED FILL — LOVASTATIN 20 MG TABLET: 20 | 30 days supply | Qty: 30 | Fill #1

## 2018-04-01 MED FILL — JARDIANCE 10 MG TABLET: 10 | 30 days supply | Qty: 30 | Fill #2

## 2018-04-01 MED FILL — LISINOPRIL 10 MG TABS: 10 | 30 days supply | Qty: 15 | Fill #3

## 2018-04-01 MED FILL — XARELTO 20 MG TABLET: 20 | 30 days supply | Qty: 30 | Fill #6

## 2018-04-11 NOTE — Progress Notes (Signed)
Cardiology Office Note   Date:  04/12/2018   ID:  Kelly Lang, DOB 1961-06-27, MRN [id number]  PCP:  Kelly Pier, MD  Cardiologist:   Mertie Moores, MD   Chief Complaint  Patient presents with  . Atrial Fibrillation   1. Paroxysmal Atrial fibrillation 2. Hypothyroidism 3. Type 2 diabetes mellitus 4. Chronic systolic CHF    Kelly Lang is a 57 y.o. female with a hx of T2DM, HL, thyroid disease and atrial fibrillation. She had been on Synthroid for some time after a thyroid bx in 1996. She was admitted 10/2013 with AFib with RVR. Echo (10/26/13): EF 40-45%, normal wall motion, mild LAE. CHADS2-VASc= 3 (LV dysfunction, DM, female). She was placed on coumadin. 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 She was last seen 11/29/2013. She was set up for DCCV after 3-4 weeks of appropriate anticoagulation. DCCV was performed 12/07/2013 but was unsuccessful. She returns for follow up.  She is stable. She is frustrated with her atrial fibrillation. She does get tired more easily. She denies chest pain. She denies significant dyspnea. She is NYHA class II. She denies syncope. She denies orthopnea, PND, edema, palpitations.  Feb. 16, 2015:  She had a cardioversion twice. Converted back to Afib both times. She has had a stress myoview which was negative for ischemia. we will start flecainide today.  February 15, 2014:  Kelly Lang returns for further evaluation of her atrial fib. She has been on Flecainide 100 BID for a month. She started feeling better about 2 weeks ago. She has converted to NSR. 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   Sept. 16, 2015:  Kelly Lang is doing well. 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    February 13, 2015:  Kelly Lang is a 57 y.o. female who presents for follow-up of atrial fibrillation. Is on coumadin . INR is 2.0 this am.   No CP or dyspnea.     Glucose levels are ok.   HbA1C is a bit high . Was started on Tragenta. Only a little bit of exercise.   Missed 4 days of flecainide due to insurance not paying for it. Staying in NSR  Nov. 29, 2016:  Had a CVA in Oct, 2016.  Had not been on her coumadin and all of her medications.  Echo revealed severe LV dysfunction with EF of 20-25%.   Was started on Xarelto and metoprolol at that time   Has had some some chills and achy feeling for the past couple of days. No cough, no fever, no runny nose.  Cannot tell when she has atrial fib Not Exercising regularly  Works in [employer]	[place] of a hotel and also in [employer] room  Glucose levels are ok  Sleeping ok .     February 08, 2016:  Kelly Lang has been back on her meds.   She had stopped her medications and had developed worsening congestive heart failure. Previous echocardiogram revealed an EF of 25%. She restart her medications and the recent echo 2 days ago reveals recovery of her left ventricular systolic function to an ejection fraction 45% which is her normal.  She's feeling quite a bit better. She has taken flecainide in the past which helped her stay in normal sinus rhythm. She stopped the flecainide due to lack of insurance. She now has [employer] and wants to get back on flecainide.  March 07, 2016:  Kelly Lang is seen back today for follow-up of her atrial fibrillation. She's been on flecainide 100 mg twice a day.  She's had her stress test for flecainide testing and it revealed no QRS widening. She had an unsuccessful cardioversion Jan. 7, 2015 She cannot tell how much she is in NSR vs. Afib.   Oct. 9, 2017:  She is s/p cardioversion in April She is feeling better.  Able to walk without any dyspnea   May 18, 2017:  Kelly Lang is seen back today for follow up  No CP or dyspnea  Is now on Jardiance for the past week   Apr 12, 2018  Kelly Lang is seen for follow up visit  Has had an ablation since I last  Feels so much  better.    Able to walk but not getting any regular exercise  Still on Xarelto.  Body mass index is 39.88 kg/m.   Past Medical History:  Diagnosis Date  . Arthritis    "all over" (06/30/2017)  . Chicken pox   . Dyslipidemia   . H/O: hypothyroidism    a. thyroid biopsy 1996 with synthroid. Stopped taking rx and was loss to follow up b. normal thyroid function 10/20/13  . High cholesterol   . Hx of cardiovascular stress test    ETT/Lexiscan Myoview (12/2013):  No ischemia; not gated; low risk.  Marland Kitchen Hypertension   . Obesity   . Persistent atrial fibrillation ([place])    a diagnosed 10/25/2013  . Seasonal allergies   . Stroke [facility]) 2016   "some speech problems since" (06/30/2017)  . 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 2/2 Afib with RVR for unknown duration (EF: 40-45%)  . Thyroid disease   . Type II diabetes mellitus ([place])    a. hg A1c 10, newly diagnosed (10/26/13)    Past Surgical History:  Procedure Laterality Date  . ATRIAL FIBRILLATION ABLATION  06/30/2017  . ATRIAL FIBRILLATION ABLATION N/A 06/30/2017   Procedure: Atrial Fibrillation Ablation;  Surgeon: Thompson Grayer, MD;  Location: [place] [facility] LAB;  Service: Cardiovascular;  Laterality: N/A;  . BIOPSY THYROID  1996  . CARDIOVERSION N/A 12/07/2013   Procedure: CARDIOVERSION;  Surgeon: Casandra Doffing, MD;  Location: [place];  Service: Cardiovascular;  Laterality: N/A;  . CARDIOVERSION N/A 03/10/2016   Procedure: CARDIOVERSION;  Surgeon: Thayer Headings, MD;  [facility] ENDOSCOPY;  Service: Cardiovascular;  Laterality: N/A;  . ESOPHAGOGASTRODUODENOSCOPY N/A 12/07/2017   Procedure: ESOPHAGOGASTRODUODENOSCOPY (EGD);  Surgeon: Irene Shipper, MD;  Location: [facility] ENDOSCOPY;  Service: Endoscopy;  Laterality: N/A;  . EYE MUSCLE SURGERY Right ~ 1966  . TEE WITHOUT CARDIOVERSION N/A 06/29/2017   Procedure: TRANSESOPHAGEAL ECHOCARDIOGRAM (TEE);  Surgeon: Fay Records, MD;  Location[facility]COPY;  Service: Cardiovascular;   Laterality: N/A;  . TONSILLECTOMY  1971     Current Outpatient Medications  Medication Sig Dispense Refill  . Blood Glucose Monitoring Suppl (ONE TOUCH ULTRA 2) w/Device KIT 1 application by Does not apply route 2 (two) times daily. 1 each 0  . clobetasol (TEMOVATE) 0.05 % external solution Apply 1 application topically daily as needed (scalp care). Apply small quarter size to affected area on scalp BID x 1 week then PRN 50 mL 3  . diclofenac sodium (VOLTAREN) 1 % GEL Apply 2 g topically 4 (four) times daily as needed (pain).    Marland Kitchen glipiZIDE (GLUCOTROL) 10 MG tablet Take 1 tablet (10 mg total) by mouth 2 (two) times daily before a meal. 60 tablet  3  . glucose blood (ONE TOUCH TEST STRIPS) test strip Use as instructed 100 each 12  . Insulin Glargine (LANTUS SOLOSTAR) 100 UNIT/ML Solostar Pen Inject 60 Units into the skin daily. 15 mL 12  . Insulin Pen Needle (ULTICARE MICRO PEN NEEDLES) 32G X 4 MM MISC 1 applicator by Does not apply route at bedtime. 100 each 2  . JARDIANCE 10 MG TABS tablet TAKE 1 TABLET BY MOUTH DAILY. 30 tablet 2  . Lancets (ONETOUCH ULTRASOFT) lancets Use as instructed 100 each 12  . lisinopril (PRINIVIL,ZESTRIL) 10 MG tablet Take 0.5 tablets (5 mg total) by mouth daily. 45 tablet 3  . lovastatin (MEVACOR) 20 MG tablet TAKE 1 TABLET BY MOUTH AT BEDTIME. 90 tablet 0  . metFORMIN (GLUCOPHAGE) 1000 MG tablet Take 1 tablet (1,000 mg total) by mouth 2 (two) times daily with a meal. 180 tablet 3  . metoCLOPramide (REGLAN) 10 MG tablet Take 10 mg by mouth every 8 (eight) hours as needed (STOMACH SPASM).    . metoprolol tartrate (LOPRESSOR) 25 MG tablet Take 12.5 mg by mouth 2 (two) times daily.    Marland Kitchen omeprazole (PRILOSEC) 40 MG capsule Take 1 capsule (40 mg total) by mouth daily.    Marland Kitchen triamcinolone cream (KENALOG) 0.1 % Apply 1 application topically 2 (two) times daily as needed (rash).    Alveda Reasons 20 MG TABS tablet TAKE 1 TABLET BY MOUTH DAILY WITH SUPPER. 30 tablet 10   No  current facility-administered medications for this visit.     Allergies:   Codeine    Social History:  The patient  reports that she quit smoking about 7 years ago. Her smoking use included cigarettes. She has a 22.00 pack-year smoking history. She has never used smokeless tobacco. She reports that she drinks alcohol. She reports that she does not use drugs.   Family History:  The patient's family history includes Hyperlipidemia in her mother; Hypertension in her mother. She was adopted.     Review of Systems:   Physical Exam: Blood pressure 118/76, pulse 71, height 5' 5.6" (1.666 m), weight 244 lb 1.9 oz (110.7 kg), SpO2 97 %.  GEN:  Well nourished, well developed in no acute distress HEENT: Normal NECK: No JVD; No carotid bruits LYMPHATICS: No lymphadenopathy CARDIAC: RRR  RESPIRATORY:  Clear to auscultation without rales, wheezing or rhonchi  ABDOMEN: Soft, non-tender, non-distended MUSCULOSKELETAL:  No edema; No deformity  SKIN: Warm and dry NEUROLOGIC:  Alert and oriented x 3   EKG:     Recent Labs: 12/06/2017: ALT 46 12/09/2017: BUN 10; Creatinine, Ser 0.59; Hemoglobin 14.3; Platelets 200; Potassium 3.6; Sodium 135    Lipid Panel    Component Value Date/Time   CHOL 156 11/19/2016 1103   TRIG 296 (H) 11/19/2016 1103   HDL 30 (L) 11/19/2016 1103   CHOLHDL 5.2 (H) 11/19/2016 1103   VLDL 59 (H) 11/19/2016 1103   LDLCALC 67 11/19/2016 1103   LDLDIRECT 114.0 02/13/2015 0750      Wt Readings from Last 3 Encounters:  04/12/18 244 lb 1.9 oz (110.7 kg)  03/31/18 246 lb (111.6 kg)  01/04/18 241 lb (109.3 kg)      Other studies Reviewed: Additional studies/ records that were reviewed today include: . Review of the above records demonstrates:    ASSESSMENT AND PLAN:  1. Atrial fibrillation-she is status post A. fib ablation.  She is maintaining normal sinus rhythm.  Continue Xarelto. She will have blood work drawn at her primary medical  doctor's office in several  weeks.  I will see her in 1 year for follow-up visit.  We will check CBC and basic metabolic profile at that time.  2. Hypothyroidism -follow-up with her primary medical doctor.  3. Type 2 diabetes mellitus -   4. Chronic systolic CHF:  Recent echo in Jan. Showed normal LV systolic and diastolic function.  Prior to this, her ejection fraction was 40 to 45%.  I suspect that she had  5.   Hyperlipidemia:  Managed by her primary .   Encouraged her to lose weight and exercise     Current medicines are reviewed at length with the patient today.  The patient does not have concerns regarding medicines.  The following changes have been made:  no change  Labs/ tests ordered today include:  No orders of the defined types were placed in this encounter.   Disposition:   FU with me in 1 year      Mertie Moores, MD  04/12/2018 9:[address], [place], [place]  [address] Phone: [phone]; Fax: [phone]

## 2018-04-12 ENCOUNTER — Ambulatory Visit: Payer: BLUE CROSS/BLUE SHIELD | Admitting: Cardiovascular Disease

## 2018-04-12 ENCOUNTER — Encounter: Payer: Self-pay | Admitting: Cardiovascular Disease

## 2018-04-12 VITALS — BP 118/76 | HR 71 | Ht 65.6 in | Wt 244.1 lb

## 2018-04-12 DIAGNOSIS — Z794 Long term (current) use of insulin: Secondary | ICD-10-CM

## 2018-04-12 DIAGNOSIS — I4891 Unspecified atrial fibrillation: Secondary | ICD-10-CM | POA: Diagnosis not present

## 2018-04-12 DIAGNOSIS — E118 Type 2 diabetes mellitus with unspecified complications: Secondary | ICD-10-CM | POA: Diagnosis not present

## 2018-04-12 NOTE — Patient Instructions (Signed)
Medication Instructions:  Your physician recommends that you continue on your current medications as directed. Please refer to the Current Medication list given to you today.   Labwork: Your physician recommends that you return for lab work in: 1 year at your next office visit (BMET, CBC)   Testing/Procedures: None Ordered   Follow-Up: Your physician wants you to follow-up in: 1 year with Dr. Elease Hashimoto. You will receive a reminder letter in the mail two months in advance. If you don't receive a letter, please call our office to schedule the follow-up appointment.   If you need a refill on your cardiac medications before your next appointment, please call your pharmacy.   Thank you for choosing [facility]! Eligha Bridegroom, RN [phone]

## 2018-04-20 MED FILL — metFORMIN HCL 1000 MG TABS: 1000 | 30 days supply | Qty: 60 | Fill #3

## 2018-04-20 MED FILL — METOPROLOL TARTRATE 25 MG T: 25 | 30 days supply | Qty: 90 | Fill #6

## 2018-04-29 ENCOUNTER — Ambulatory Visit: Payer: BLUE CROSS/BLUE SHIELD | Attending: Internal Medicine | Admitting: Internal Medicine

## 2018-04-29 ENCOUNTER — Encounter: Payer: Self-pay | Admitting: Internal Medicine

## 2018-04-29 VITALS — BP 110/80 | HR 57 | Temp 98.5°F | Resp 16 | Wt 247.6 lb

## 2018-04-29 DIAGNOSIS — E1143 Type 2 diabetes mellitus with diabetic autonomic (poly)neuropathy: Secondary | ICD-10-CM | POA: Insufficient documentation

## 2018-04-29 DIAGNOSIS — M79622 Pain in left upper arm: Secondary | ICD-10-CM | POA: Diagnosis not present

## 2018-04-29 DIAGNOSIS — Z8249 Family history of ischemic heart disease and other diseases of the circulatory system: Secondary | ICD-10-CM | POA: Diagnosis not present

## 2018-04-29 DIAGNOSIS — K3184 Gastroparesis: Secondary | ICD-10-CM | POA: Insufficient documentation

## 2018-04-29 DIAGNOSIS — Z7901 Long term (current) use of anticoagulants: Secondary | ICD-10-CM | POA: Insufficient documentation

## 2018-04-29 DIAGNOSIS — M1990 Unspecified osteoarthritis, unspecified site: Secondary | ICD-10-CM | POA: Diagnosis not present

## 2018-04-29 DIAGNOSIS — I110 Hypertensive heart disease with heart failure: Secondary | ICD-10-CM | POA: Diagnosis not present

## 2018-04-29 DIAGNOSIS — M79602 Pain in left arm: Secondary | ICD-10-CM | POA: Diagnosis not present

## 2018-04-29 DIAGNOSIS — Z9889 Other specified postprocedural states: Secondary | ICD-10-CM | POA: Insufficient documentation

## 2018-04-29 DIAGNOSIS — L559 Sunburn, unspecified: Secondary | ICD-10-CM | POA: Diagnosis not present

## 2018-04-29 DIAGNOSIS — M79605 Pain in left leg: Secondary | ICD-10-CM

## 2018-04-29 DIAGNOSIS — Z79899 Other long term (current) drug therapy: Secondary | ICD-10-CM | POA: Insufficient documentation

## 2018-04-29 DIAGNOSIS — L309 Dermatitis, unspecified: Secondary | ICD-10-CM | POA: Diagnosis not present

## 2018-04-29 DIAGNOSIS — I5022 Chronic systolic (congestive) heart failure: Secondary | ICD-10-CM | POA: Insufficient documentation

## 2018-04-29 DIAGNOSIS — Z885 Allergy status to narcotic agent status: Secondary | ICD-10-CM | POA: Diagnosis not present

## 2018-04-29 DIAGNOSIS — Z8673 Personal history of transient ischemic attack (TIA), and cerebral infarction without residual deficits: Secondary | ICD-10-CM | POA: Insufficient documentation

## 2018-04-29 DIAGNOSIS — Z6841 Body Mass Index (BMI) 40.0 and over, adult: Secondary | ICD-10-CM | POA: Diagnosis not present

## 2018-04-29 DIAGNOSIS — Z87891 Personal history of nicotine dependence: Secondary | ICD-10-CM | POA: Diagnosis not present

## 2018-04-29 DIAGNOSIS — E119 Type 2 diabetes mellitus without complications: Secondary | ICD-10-CM | POA: Diagnosis not present

## 2018-04-29 DIAGNOSIS — M79601 Pain in right arm: Secondary | ICD-10-CM

## 2018-04-29 DIAGNOSIS — M79621 Pain in right upper arm: Secondary | ICD-10-CM | POA: Insufficient documentation

## 2018-04-29 DIAGNOSIS — E66813 Obesity, class 3: Secondary | ICD-10-CM

## 2018-04-29 DIAGNOSIS — M79604 Pain in right leg: Secondary | ICD-10-CM

## 2018-04-29 DIAGNOSIS — Z794 Long term (current) use of insulin: Secondary | ICD-10-CM | POA: Diagnosis not present

## 2018-04-29 DIAGNOSIS — E118 Type 2 diabetes mellitus with unspecified complications: Secondary | ICD-10-CM

## 2018-04-29 LAB — GLUCOSE, POCT (MANUAL RESULT ENTRY): POC GLUCOSE: 110 mg/dL — AB (ref 70–99)

## 2018-04-29 MED ORDER — DICLOFENAC SODIUM 1 % TD GEL: 2.0000 g | Freq: Four times a day (QID) | TRANSDERMAL | 4 refills | Status: DC | PRN

## 2018-04-29 MED ORDER — TRIAMCINOLONE ACETONIDE 0.1 % EX CREA: 1.0000 "application " | TOPICAL_CREAM | Freq: Two times a day (BID) | CUTANEOUS | 1 refills | Status: DC | PRN

## 2018-04-29 MED FILL — DICLOFENAC SODIUM 1% GEL: 1 | 12 days supply | Qty: 100 | Fill #0

## 2018-04-29 MED FILL — TRIAMCINOLONE ACETONIDE 0.1: 0.1 | 14 days supply | Qty: 45 | Fill #0

## 2018-04-29 NOTE — Progress Notes (Signed)
Patient ID: Kelly Lang, female    DOB: [birth year]  MRN: [id number]  CC: Diabetes   Subjective: Kelly Lang is a 57 y.o. female who presents for chronic ds management. Her concerns today include:  Pt with hx of a.flutters/p ablation7/2018on Xarelto, DM type 2 , gastroparesis, obesity, HL, HTN, chronic systolic CHF with EF 22%, CVA.  Derm:  She continues to have flaky rash on scalp. Clobetasol solution helps. She has an area on the right elbow and small patches on the lower legs. She was referred to dermatology when seen by our physician assistant 4 weeks ago. She has not received appointment as yet.  She recently spent some time at the beach and was sunburned on her legs before she had a chance to apply sunscreen. She has been using some aloe vera cream to these areas.   DM:  Checks blood sugars twice a day. A.m range in 130s; before dinner highest was 120.  Med:  Now up to 72 units lantus. Reports compliance with Metformin, Glucotrol and Jardiance. Glucotrol increased 4 weeks ago by PA Eating habits:  Doing well with smaller portion sizes. She has not had to use Reglan at all. Exercise: she walks around her pool and swims in her pool 4 x a wk for 30-45 mins She is discouraged about wgh gain.  Feels increase in Glucotrol 1 mth ago conttibuted.  Since January of this year she has gained 13 pounds  She is requesting refill on Voltaren gel which he uses for joint pains. She is on Xarelto and oral NSAIDs are not advised. Patient Active Problem List   Diagnosis Date Noted  . Gastroparesis 12/21/2017  . Non-intractable cyclical vomiting with nausea   . GI bleed 12/06/2017  . Hepatic steatosis 12/04/2017  . Chronic systolic CHF (congestive heart failure) ([place]) 09/16/2015  . Cerebrovascular accident (CVA) due to embolism of left middle cerebral artery ([place])   . Thyroid mass 09/15/2015  . Low back pain 04/03/2014  . Unspecified vitamin D deficiency 01/20/2014  . Essential  hypertension, benign 01/20/2014  . Chronic anticoagulation 10/31/2013  . Tachycardia induced cardiomyopathy ([place])   . H/O: hypothyroidism   . Obesity   . IDDM (insulin dependent diabetes mellitus) ([place])   . Arthritis   . Dyslipidemia   . Atrial fibrillation  10/25/2013     Current Outpatient Medications on File Prior to Visit  Medication Sig Dispense Refill  . Blood Glucose Monitoring Suppl (ONE TOUCH ULTRA 2) w/Device KIT 1 application by Does not apply route 2 (two) times daily. 1 each 0  . clobetasol (TEMOVATE) 0.05 % external solution Apply 1 application topically daily as needed (scalp care). Apply small quarter size to affected area on scalp BID x 1 week then PRN 50 mL 3  . diclofenac sodium (VOLTAREN) 1 % GEL Apply 2 g topically 4 (four) times daily as needed (pain).    Marland Kitchen glipiZIDE (GLUCOTROL) 10 MG tablet Take 1 tablet (10 mg total) by mouth 2 (two) times daily before a meal. 60 tablet 3  . glucose blood (ONE TOUCH TEST STRIPS) test strip Use as instructed 100 each 12  . Insulin Glargine (LANTUS SOLOSTAR) 100 UNIT/ML Solostar Pen Inject 60 Units into the skin daily. 15 mL 12  . Insulin Pen Needle (ULTICARE MICRO PEN NEEDLES) 32G X 4 MM MISC 1 applicator by Does not apply route at bedtime. 100 each 2  . JARDIANCE 10 MG TABS tablet TAKE 1 TABLET BY MOUTH DAILY. 30 tablet 2  .  Lancets (ONETOUCH ULTRASOFT) lancets Use as instructed 100 each 12  . lisinopril (PRINIVIL,ZESTRIL) 10 MG tablet Take 0.5 tablets (5 mg total) by mouth daily. 45 tablet 3  . lovastatin (MEVACOR) 20 MG tablet TAKE 1 TABLET BY MOUTH AT BEDTIME. 90 tablet 0  . metFORMIN (GLUCOPHAGE) 1000 MG tablet Take 1 tablet (1,000 mg total) by mouth 2 (two) times daily with a meal. 180 tablet 3  . metoCLOPramide (REGLAN) 10 MG tablet Take 10 mg by mouth every 8 (eight) hours as needed (STOMACH SPASM).    . metoprolol tartrate (LOPRESSOR) 25 MG tablet Take 12.5 mg by mouth 2 (two) times daily.    Marland Kitchen omeprazole (PRILOSEC) 40 MG  capsule Take 1 capsule (40 mg total) by mouth daily.    Marland Kitchen triamcinolone cream (KENALOG) 0.1 % Apply 1 application topically 2 (two) times daily as needed (rash).    Alveda Reasons 20 MG TABS tablet TAKE 1 TABLET BY MOUTH DAILY WITH SUPPER. 30 tablet 10   No current facility-administered medications on file prior to visit.     Allergies  Allergen Reactions  . Codeine Itching and Rash    Social History   Socioeconomic History  . Marital status: Divorced    Spouse name: Not on file  . Number of children: 0  . Years of education: 83  . Highest education level: Not on file  Occupational History  . Occupation: Chief Technology Officer  Social Needs  . Financial resource strain: Not on file  . Food insecurity:    Worry: Not on file    Inability: Not on file  . Transportation needs:    Medical: Not on file    Non-medical: Not on file  Tobacco Use  . Smoking status: Former Smoker    Packs/day: 1.00    Years: 22.00    Pack years: 22.00    Types: Cigarettes    Last attempt to quit: 12/01/2010    Years since quitting: 7.4  . Smokeless tobacco: Never Used  Substance and Sexual Activity  . Alcohol use: Yes    Comment: 06/30/2017 "glass of wine maybe q other month"  . Drug use: No  . Sexual activity: Not Currently  Lifestyle  . Physical activity:    Days per week: Not on file    Minutes per session: Not on file  . Stress: Not on file  Relationships  . Social connections:    Talks on phone: Not on file    Gets together: Not on file    Attends religious service: Not on file    Active member of club or organization: Not on file    Attends meetings of clubs or organizations: Not on file    Relationship status: Not on file  . Intimate partner violence:    Fear of current or ex partner: Not on file    Emotionally abused: Not on file    Physically abused: Not on file    Forced sexual activity: Not on file  Other Topics Concern  . Not on file  Social History Narrative   Moved to [place] from [place] in 2002.     Fun: shop, sew, decorate   Denies any beliefs effecting healthcare    Family History  Adopted: Yes  Problem Relation Age of Onset  . Hypertension Mother   . Hyperlipidemia Mother     Past Surgical History:  Procedure Laterality Date  . ATRIAL FIBRILLATION ABLATION  06/30/2017  . ATRIAL FIBRILLATION ABLATION N/A 06/30/2017  Procedure: Atrial Fibrillation Ablation;  Surgeon: Thompson Grayer, MD;  Location: [facility];  Service: Cardiovascular;  Laterality: N/A;  . BIOPSY THYROID  1996  . CARDIOVERSION N/A 12/07/2013   Procedure: CARDIOVERSION;  Surgeon: Casandra Doffing, MD;  Location: [facility];  Service: Cardiovascular;  Laterality: N/A;  . CARDIOVERSION N/A 03/10/2016   Procedure: CARDIOVERSION;  Surgeon: Thayer Headings, MD;  Location: [facility];  Service: Cardiovascular;  Laterality: N/A;  . ESOPHAGOGASTRODUODENOSCOPY N/A 12/07/2017   Procedure: ESOPHAGOGASTRODUODENOSCOPY (EGD);  Surgeon: Irene Shipper, MD;  Location: [facility] ENDOSCOPY;  Service: Endoscopy;  Laterality: N/A;  . EYE MUSCLE SURGERY Right ~ 1966  . TEE WITHOUT CARDIOVERSION N/A 06/29/2017   Procedure: TRANSESOPHAGEAL ECHOCARDIOGRAM (TEE);  Surgeon: Fay Records, MD;  Location: [facility];  Service: Cardiovascular;  Laterality: N/A;  . TONSILLECTOMY  1971    ROS: Review of Systems Negative except as stated above PHYSICAL EXAM: BP 110/80   Pulse (!) 57   Temp 98.5 F (36.9 C) (Oral)   Resp 16   Wt 247 lb 9.6 oz (112.3 kg)   SpO2 98%   BMI 40.45 kg/m   Wt Readings from Last 3 Encounters:  04/29/18 247 lb 9.6 oz (112.3 kg)  04/12/18 244 lb 1.9 oz (110.7 kg)  03/31/18 246 lb (111.6 kg)    Physical Exam  General appearance - alert, well appearing, and in no distress Mental status - normal mood, behavior, speech. Tearful when talking about her weight Neck - supple, no significant adenopathy Chest - clear to auscultation, no wheezes, rales or rhonchi, symmetric air  entry Heart - normal rate, regular rhythm, normal S1, S2, no murmurs, rubs, clicks or gallops Extremities - peripheral pulses normal, no pedal edema, no clubbing or cyanosis Skin - patches of moderate sunburn on both lower legs. No blistering. Shiny salmon-colored patch on the right elbow. Flaky shiny rash on the scalp especially occipital area Results for orders placed or performed in visit on 04/29/18  POCT glucose (manual entry)  Result Value Ref Range   POC Glucose 110 (A) 70 - 99 mg/dl   Lab Results  Component Value Date   HGBA1C 7.3 03/31/2018     Chemistry      Component Value Date/Time   NA 135 12/09/2017 0520   NA 143 11/25/2017 1017   K 3.6 12/09/2017 0520   CL 105 12/09/2017 0520   CO2 19 (L) 12/09/2017 0520   BUN 10 12/09/2017 0520   BUN 14 11/25/2017 1017   CREATININE 0.59 12/09/2017 0520   CREATININE 0.90 06/16/2016 1128      Component Value Date/Time   CALCIUM 8.7 (L) 12/09/2017 0520   ALKPHOS 51 12/06/2017 0428   AST 27 12/06/2017 0428   ALT 46 12/06/2017 0428   BILITOT 1.0 12/06/2017 0428   BILITOT 0.3 11/25/2017 1017       ASSESSMENT AND PLAN: 1. Controlled type 2 diabetes mellitus with complication, with long-term current use of insulin (HCC) Continue healthy eating habits and regular exercise. - POCT glucose (manual entry)  2. Class 3 severe obesity with serious comorbidity and body mass index (BMI) of 40.0 to 44.9 in adult, unspecified obesity type ([place]) -Informed patient that insulin and Glucotrol can cause weight gain. A medication that can help balance out this effect would be Victoza or trulicity. However, I do not think this would be a good option given documented abnormal gastric emptying study just several months ago -I also do not think from tyramine would be a  good option given her history of arrhythmias  3. Dermatitis Lesions look like psoriasis. Message sent to referral clinic for inquiring about her dermatology appointment. Encourage  use of sunscreen when she anticipates exposure to the sun for prolonged periods - triamcinolone cream (KENALOG) 0.1 %; Apply 1 application topically 2 (two) times daily as needed (rash).  Dispense: 45 g; Refill: 1  4. Pain in both upper extremities - diclofenac sodium (VOLTAREN) 1 % GEL; Apply 2 g topically 4 (four) times daily as needed (pain).  Dispense: 100 g; Refill: 4  5. Pain in both lower extremities - diclofenac sodium (VOLTAREN) 1 % GEL; Apply 2 g topically 4 (four) times daily as needed (pain).  Dispense: 100 g; Refill: 4  6. Burn from the sun See #3 above   Patient was given the opportunity to ask questions.  Patient verbalized understanding of the plan and was able to repeat key elements of the plan.   Orders Placed This Encounter  Procedures  . POCT glucose (manual entry)     Requested Prescriptions    No prescriptions requested or ordered in this encounter    No follow-ups on file.  Karle Plumber, MD, FACP

## 2018-04-30 MED FILL — LOVASTATIN 20 MG TABLET: 20 | 30 days supply | Qty: 30 | Fill #2

## 2018-04-30 MED FILL — glipiZIDE 10 MG TABS: 10 | 30 days supply | Qty: 60 | Fill #1

## 2018-04-30 MED FILL — LISINOPRIL 10 MG TABS: 10 | 30 days supply | Qty: 15 | Fill #4

## 2018-04-30 MED FILL — LANTUS SOLOSTAR 100 UNITS/M: 100 | 25 days supply | Qty: 15 | Fill #5

## 2018-04-30 MED FILL — XARELTO 20 MG TABLET: 20 | 30 days supply | Qty: 30 | Fill #7

## 2018-04-30 MED FILL — JARDIANCE 10 MG TABLET: 10 | 30 days supply | Qty: 30 | Fill #3

## 2018-05-24 ENCOUNTER — Other Ambulatory Visit: Payer: Self-pay | Admitting: Internal Medicine

## 2018-05-24 MED FILL — glipiZIDE 10 MG TABS: 10 | 30 days supply | Qty: 60 | Fill #2

## 2018-05-24 MED FILL — METOPROLOL TARTRATE 25 MG T: 25 | 30 days supply | Qty: 90 | Fill #7

## 2018-05-24 MED FILL — LISINOPRIL 10 MG TABS: 10 | 30 days supply | Qty: 15 | Fill #5

## 2018-05-24 MED FILL — metFORMIN HCL 1000 MG TABS: 1000 | 30 days supply | Qty: 60 | Fill #4

## 2018-05-24 MED FILL — LANTUS SOLOSTAR 100 UNITS/M: 100 | 25 days supply | Qty: 15 | Fill #6

## 2018-05-25 ENCOUNTER — Other Ambulatory Visit: Payer: Self-pay | Admitting: Internal Medicine

## 2018-05-25 MED FILL — XARELTO 20 MG TABLET: 20 | 30 days supply | Qty: 30 | Fill #8

## 2018-05-26 MED FILL — LOVASTATIN 20 MG TABLET: 20 | 30 days supply | Qty: 30 | Fill #0

## 2018-05-26 MED FILL — JARDIANCE 10 MG TABLET: 10 | 30 days supply | Qty: 30 | Fill #0

## 2018-06-07 ENCOUNTER — Ambulatory Visit: Payer: BLUE CROSS/BLUE SHIELD | Admitting: Gastroenterology

## 2018-06-09 ENCOUNTER — Ambulatory Visit: Payer: BLUE CROSS/BLUE SHIELD | Admitting: Physician Assistant

## 2018-06-09 ENCOUNTER — Encounter: Payer: Self-pay | Admitting: Physician Assistant

## 2018-06-09 VITALS — BP 110/70 | HR 76 | Ht 65.5 in | Wt 243.0 lb

## 2018-06-09 DIAGNOSIS — K219 Gastro-esophageal reflux disease without esophagitis: Secondary | ICD-10-CM | POA: Diagnosis not present

## 2018-06-09 DIAGNOSIS — E1143 Type 2 diabetes mellitus with diabetic autonomic (poly)neuropathy: Secondary | ICD-10-CM | POA: Diagnosis not present

## 2018-06-09 DIAGNOSIS — K3184 Gastroparesis: Secondary | ICD-10-CM

## 2018-06-09 MED ORDER — METOCLOPRAMIDE HCL 5 MG PO TABS: ORAL_TABLET | ORAL | 5 refills | Status: DC

## 2018-06-09 MED ORDER — OMEPRAZOLE 20 MG PO CPDR: DELAYED_RELEASE_CAPSULE | ORAL | 5 refills | Status: DC

## 2018-06-09 MED FILL — METOCLOPRAMIDE 5 MG TABLET: 5 | 30 days supply | Qty: 30 | Fill #0

## 2018-06-09 MED FILL — OMEPRAZOLE 20 MG CAP: 20 | 30 days supply | Qty: 30 | Fill #0

## 2018-06-09 NOTE — Progress Notes (Signed)
Subjective:    Patient ID: Kelly Lang, female    DOB: 09/14/1961, 57 y.o.   MRN: [id number]  HPI Kelly Lang is a pleasant 57 year old white female known to Dr. Fuller Plan.  He was last seen in the office in January 2019 by Ellouise Newer PA-C and comes in today for follow-up having increase in her GI symptoms over the past 6 weeks.  She has a diagnosis of diabetic gastroparesis. Patient has history of atrial fibrillation for which she is on Xarelto, insulin-dependent diabetes mellitus, hypertension, prior history of C VA and congestive heart failure.  She had colonoscopy in May 2018 showing large mouth diverticuli in the sigmoid colon, no polyps. She had EGD in January 2019 which was a normal exam, and then gastric emptying scan which showed marked delay in emptying with 3.1% emptying at 1 hour, 0.2% at 2 hours but 92% had emptied by 4 hours. At the time of last office visit she was on omeprazole 20 mg daily and metoclopramide 10 mg 3 times daily. She had improvement in her nausea and vomiting.  She was to continue gastroparesis diet and gradually taper metoclopramide. She says the medications definitely helped her symptoms.  She was trying to come all the way off medications and actually ran out about 6 weeks ago and did not call for refills. She has now developed recurrent heartburn and indigestion intermittently.  She says she has good days and bad days with nausea fullness and early satiety and very rare vomiting. She has  been having a bad day today, and says generally when her blood sugars are not well controlled her GI symptoms will definitely be worse.  She says she is trying to eat healthy and follow both the diabetic and gastro-paretic instructions which she says is difficult.  Review of Systems Pertinent positive and negative review of systems were noted in the above HPI section.  All other review of systems was otherwise negative.  Outpatient Encounter Medications as of 06/09/2018    Medication Sig  . Blood Glucose Monitoring Suppl (ONE TOUCH ULTRA 2) w/Device KIT 1 application by Does not apply route 2 (two) times daily.  . clobetasol (TEMOVATE) 0.05 % external solution Apply 1 application topically daily as needed (scalp care). Apply small quarter size to affected area on scalp BID x 1 week then PRN  . diclofenac sodium (VOLTAREN) 1 % GEL Apply 2 g topically 4 (four) times daily as needed (pain).  Marland Kitchen glipiZIDE (GLUCOTROL) 10 MG tablet Take 1 tablet (10 mg total) by mouth 2 (two) times daily before a meal.  . glucose blood (ONE TOUCH TEST STRIPS) test strip Use as instructed  . Insulin Glargine (LANTUS SOLOSTAR) 100 UNIT/ML Solostar Pen Inject 60 Units into the skin daily.  . Insulin Pen Needle (ULTICARE MICRO PEN NEEDLES) 32G X 4 MM MISC 1 applicator by Does not apply route at bedtime.  Marland Kitchen JARDIANCE 10 MG TABS tablet TAKE 1 TABLET BY MOUTH DAILY.  Marland Kitchen Lancets (ONETOUCH ULTRASOFT) lancets Use as instructed  . lisinopril (PRINIVIL,ZESTRIL) 10 MG tablet Take 0.5 tablets (5 mg total) by mouth daily.  Marland Kitchen lovastatin (MEVACOR) 20 MG tablet TAKE 1 TABLET BY MOUTH AT BEDTIME.  . metFORMIN (GLUCOPHAGE) 1000 MG tablet Take 1 tablet (1,000 mg total) by mouth 2 (two) times daily with a meal.  . metoCLOPramide (REGLAN) 10 MG tablet Take 10 mg by mouth every 8 (eight) hours as needed (STOMACH SPASM).  . metoprolol tartrate (LOPRESSOR) 25 MG tablet Take 12.5 mg by  mouth 2 (two) times daily.  Marland Kitchen omeprazole (PRILOSEC) 40 MG capsule Take 1 capsule (40 mg total) by mouth daily.  Marland Kitchen triamcinolone cream (KENALOG) 0.1 % Apply 1 application topically 2 (two) times daily as needed (rash).  Alveda Reasons 20 MG TABS tablet TAKE 1 TABLET BY MOUTH DAILY WITH SUPPER.  . [DISCONTINUED] JARDIANCE 10 MG TABS tablet TAKE 1 TABLET BY MOUTH DAILY.  Marland Kitchen metoCLOPramide (REGLAN) 5 MG tablet Take 1 tab by mouth  30 min before meals as needed.  Marland Kitchen omeprazole (PRILOSEC) 20 MG capsule Take 1 capsule by mouth once daily every  morning.   No facility-administered encounter medications on file as of 06/09/2018.    Allergies  Allergen Reactions  . Codeine Itching and Rash   Patient Active Problem List   Diagnosis Date Noted  . Gastroparesis 12/21/2017  . GI bleed 12/06/2017  . Hepatic steatosis 12/04/2017  . Chronic systolic CHF (congestive heart failure) ([place]) 09/16/2015  . Cerebrovascular accident (CVA) due to embolism of left middle cerebral artery ([place])   . Thyroid mass 09/15/2015  . Low back pain 04/03/2014  . Unspecified vitamin D deficiency 01/20/2014  . Essential hypertension, benign 01/20/2014  . Chronic anticoagulation 10/31/2013  . Tachycardia induced cardiomyopathy ([place])   . H/O: hypothyroidism   . Obesity   . IDDM (insulin dependent diabetes mellitus) ([place])   . Arthritis   . Dyslipidemia   . Atrial fibrillation  10/25/2013   Social History   Socioeconomic History  . Marital status: Divorced    Spouse name: Not on file  . Number of children: 0  . Years of education: 49  . Highest education level: Not on file  Occupational History  . Occupation: Chief Technology Officer  Social Needs  . Financial resource strain: Not on file  . Food insecurity:    Worry: Not on file    Inability: Not on file  . Transportation needs:    Medical: Not on file    Non-medical: Not on file  Tobacco Use  . Smoking status: Former Smoker    Packs/day: 1.00    Years: 22.00    Pack years: 22.00    Types: Cigarettes    Last attempt to quit: 12/01/2010    Years since quitting: 7.5  . Smokeless tobacco: Never Used  Substance and Sexual Activity  . Alcohol use: Yes    Comment: 06/30/2017 "glass of wine maybe q other month"  . Drug use: No  . Sexual activity: Not Currently  Lifestyle  . Physical activity:    Days per week: Not on file    Minutes per session: Not on file  . Stress: Not on file  Relationships  . Social connections:    Talks on phone: Not on file    Gets together: Not on file    Attends  religious service: Not on file    Active member of club or organization: Not on file    Attends meetings of clubs or organizations: Not on file    Relationship status: Not on file  . Intimate partner violence:    Fear of current or ex partner: Not on file    Emotionally abused: Not on file    Physically abused: Not on file    Forced sexual activity: Not on file  Other Topics Concern  . Not on file  Social History Narrative   Moved to [place] from [place] in 2002.     Fun: shop, sew, decorate   Denies any beliefs effecting  healthcare    Ms. Alamillo's family history includes Hyperlipidemia in her mother; Hypertension in her mother. She was adopted.      Objective:    Vitals:   06/09/18 1332  BP: 110/70  Pulse: 76    Physical Exam; well-developed white female in no acute distress, pleasant blood pressure 110/70, pulse 76, height 5 foot 5, weight 243, BMI 39.8.  HEENT ;nontraumatic normocephalic EOMI PERRLA sclera anicteric oropharynx clear, Cardiovascular ;regular rate and rhythm with S1-S2 no murmur rub or gallop, Pulmonary ;clear bilaterally, Abdomen ;obese, soft she has some mild tenderness in the upper abdomen/epigastrium, bowel sounds are present no succussion splash no palpable mass or hepatosplenomegaly, Rectal; exam not done, Extremities ;no clubbing cyanosis or edema skin warm dry, Neuro psych ;alert and oriented, grossly nonfocal       Assessment & Plan:   #3 57 year old female insulin-dependent diabetic with diabetic gastroparesis, documented on gastric emptying scan January 2019. She has tolerated metoclopramide which has definitely helped her symptoms but ran out of medication about 6 weeks ago. She has not had any increase in vomiting but has had an increase in nausea and fullness. #2 GERD #3 atrial fibrillation/on Xarelto #4.  Hypertension #5.  Prior history of CVA #6.  Congestive heart failure #7.  Colon cancer surveillance-up-to-date with negative  colonoscopy with exception of diverticuli May 2018  Plan; continue gastroparesis step 3 diet Resume omeprazole 20 mg p.o. every morning/refill sent Resume metoclopramide at lower dose, 5 mg p.o. 1/2-hour before meals 3 times daily.  We discussed as needed use of the metoclopramide.  She is advised to take regularly during periods of time with more active symptoms or poor glucose control and then wean to PRN. We rediscussed potential neurologic side effects of metoclopramide patient advised to discontinue therapy should she develop any neurologic symptoms and to notify us of any changes. We will plan to follow-up in the office/Dr. Fuller Plan or myself in 6 months or sooner as needed.   Jager Koska S Gurnoor Ursua PA-C 06/09/2018   Cc: Ladell Pier, MD

## 2018-06-09 NOTE — Patient Instructions (Signed)
We have sent the following medications to your pharmacy for you to pick up at your convenience: [facility], [address] 1. Reglan ( Metoclopramide) 5 mg 2. Omeprazole ( Prilosec) 20 mg.  We have provided you with a Gastroparesis diet handout- follow step 3. Follow up with Mike Gip PA or Dr. Claudette Head in December.    If you are age 55 or younger, your body mass index should be between 19-25. Your Body mass index is 39.82 kg/m. If this is out of the aformentioned range listed, please consider follow up with your Primary Care Provider.

## 2018-06-10 NOTE — Progress Notes (Signed)
Reviewed and agree with initial management plan.  Ryin Ambrosius T. [employer] Mcjunkins, MD FACG 

## 2018-06-25 MED FILL — XARELTO 20 MG TABLET: 20 | 30 days supply | Qty: 30 | Fill #9

## 2018-06-25 MED FILL — METOPROLOL TARTRATE 25 MG T: 25 | 30 days supply | Qty: 90 | Fill #8

## 2018-06-25 MED FILL — metFORMIN HCL 1000 MG TABS: 1000 | 30 days supply | Qty: 60 | Fill #5

## 2018-06-25 MED FILL — LISINOPRIL 10 MG TABS: 10 | 30 days supply | Qty: 15 | Fill #6

## 2018-06-25 MED FILL — LOVASTATIN 20 MG TABLET: 20 | 30 days supply | Qty: 30 | Fill #1

## 2018-06-25 MED FILL — glipiZIDE 10 MG TABS: 10 | 30 days supply | Qty: 60 | Fill #3

## 2018-06-25 MED FILL — JARDIANCE 10 MG TABLET: 10 | 30 days supply | Qty: 30 | Fill #1

## 2018-06-25 MED FILL — LANTUS SOLOSTAR 100 UNITS/M: 100 | 25 days supply | Qty: 15 | Fill #7

## 2018-07-15 DIAGNOSIS — L400 Psoriasis vulgaris: Secondary | ICD-10-CM | POA: Diagnosis not present

## 2018-07-15 DIAGNOSIS — L409 Psoriasis, unspecified: Secondary | ICD-10-CM | POA: Diagnosis not present

## 2018-07-16 ENCOUNTER — Telehealth: Payer: Self-pay | Admitting: Gastroenterology

## 2018-07-16 NOTE — Telephone Encounter (Signed)
Please advise. Thank you

## 2018-07-18 NOTE — Telephone Encounter (Signed)
Skyrizi   Is a biologic - no contraindication with  Her GI meds   Or gastroparesis, GERD

## 2018-07-19 NOTE — Telephone Encounter (Signed)
Patient notified by phone that there is no contraindication,

## 2018-07-21 MED FILL — METOCLOPRAMIDE 5 MG TABLET: 5 | 30 days supply | Qty: 30 | Fill #1

## 2018-07-21 MED FILL — OMEPRAZOLE 20 MG CAP: 20 | 30 days supply | Qty: 30 | Fill #1

## 2018-07-28 ENCOUNTER — Other Ambulatory Visit: Payer: Self-pay | Admitting: Cardiovascular Disease

## 2018-07-28 ENCOUNTER — Other Ambulatory Visit: Payer: Self-pay | Admitting: Physician Assistant

## 2018-07-28 ENCOUNTER — Other Ambulatory Visit: Payer: Self-pay | Admitting: Internal Medicine

## 2018-07-28 MED FILL — METOPROLOL TARTRATE 25 MG T: 25 | 90 days supply | Qty: 270 | Fill #0

## 2018-07-28 MED FILL — LOVASTATIN 20 MG TABLET: 20 | 30 days supply | Qty: 30 | Fill #0

## 2018-07-28 MED FILL — LISINOPRIL 10 MG TABS: 10 | 30 days supply | Qty: 15 | Fill #7

## 2018-07-28 MED FILL — CLOBETASOL 0.05% SOLUTION: 0.05 | 25 days supply | Qty: 50 | Fill #1

## 2018-07-28 MED FILL — glipiZIDE 10 MG TABS: 10 | 30 days supply | Qty: 60 | Fill #0

## 2018-07-28 MED FILL — DICLOFENAC SODIUM 1% GEL: 1 | 12 days supply | Qty: 100 | Fill #1

## 2018-07-28 MED FILL — XARELTO 20 MG TABLET: 20 | 30 days supply | Qty: 30 | Fill #10

## 2018-07-28 MED FILL — metFORMIN HCL 1000 MG TABS: 1000 | 90 days supply | Qty: 180 | Fill #6

## 2018-07-28 MED FILL — JARDIANCE 10 MG TABLET: 10 | 30 days supply | Qty: 30 | Fill #2

## 2018-07-28 MED FILL — LANTUS SOLOSTAR 100 UNITS/M: 100 | 25 days supply | Qty: 15 | Fill #8

## 2018-07-28 NOTE — Addendum Note (Signed)
Addended by: Demetrios Loll on: 07/28/2018 10:59 AM   Modules accepted: Orders

## 2018-08-09 ENCOUNTER — Encounter: Payer: Self-pay | Admitting: Internal Medicine

## 2018-08-09 ENCOUNTER — Ambulatory Visit: Payer: BLUE CROSS/BLUE SHIELD | Attending: Internal Medicine | Admitting: Internal Medicine

## 2018-08-09 VITALS — BP 105/75 | HR 68 | Temp 98.4°F | Resp 16 | Wt 249.8 lb

## 2018-08-09 DIAGNOSIS — M2550 Pain in unspecified joint: Secondary | ICD-10-CM | POA: Diagnosis not present

## 2018-08-09 DIAGNOSIS — E1165 Type 2 diabetes mellitus with hyperglycemia: Secondary | ICD-10-CM

## 2018-08-09 DIAGNOSIS — Z8673 Personal history of transient ischemic attack (TIA), and cerebral infarction without residual deficits: Secondary | ICD-10-CM | POA: Insufficient documentation

## 2018-08-09 DIAGNOSIS — G894 Chronic pain syndrome: Secondary | ICD-10-CM | POA: Insufficient documentation

## 2018-08-09 DIAGNOSIS — Z87891 Personal history of nicotine dependence: Secondary | ICD-10-CM | POA: Insufficient documentation

## 2018-08-09 DIAGNOSIS — I110 Hypertensive heart disease with heart failure: Secondary | ICD-10-CM | POA: Insufficient documentation

## 2018-08-09 DIAGNOSIS — I5022 Chronic systolic (congestive) heart failure: Secondary | ICD-10-CM | POA: Insufficient documentation

## 2018-08-09 DIAGNOSIS — Z23 Encounter for immunization: Secondary | ICD-10-CM | POA: Diagnosis not present

## 2018-08-09 DIAGNOSIS — E669 Obesity, unspecified: Secondary | ICD-10-CM | POA: Insufficient documentation

## 2018-08-09 DIAGNOSIS — E118 Type 2 diabetes mellitus with unspecified complications: Secondary | ICD-10-CM

## 2018-08-09 DIAGNOSIS — Z9889 Other specified postprocedural states: Secondary | ICD-10-CM | POA: Insufficient documentation

## 2018-08-09 DIAGNOSIS — I4891 Unspecified atrial fibrillation: Secondary | ICD-10-CM | POA: Insufficient documentation

## 2018-08-09 DIAGNOSIS — E1142 Type 2 diabetes mellitus with diabetic polyneuropathy: Secondary | ICD-10-CM | POA: Diagnosis not present

## 2018-08-09 DIAGNOSIS — E119 Type 2 diabetes mellitus without complications: Secondary | ICD-10-CM

## 2018-08-09 DIAGNOSIS — Z6841 Body Mass Index (BMI) 40.0 and over, adult: Secondary | ICD-10-CM | POA: Insufficient documentation

## 2018-08-09 DIAGNOSIS — I10 Essential (primary) hypertension: Secondary | ICD-10-CM

## 2018-08-09 DIAGNOSIS — Z794 Long term (current) use of insulin: Secondary | ICD-10-CM | POA: Insufficient documentation

## 2018-08-09 DIAGNOSIS — Z79899 Other long term (current) drug therapy: Secondary | ICD-10-CM | POA: Insufficient documentation

## 2018-08-09 DIAGNOSIS — E1143 Type 2 diabetes mellitus with diabetic autonomic (poly)neuropathy: Secondary | ICD-10-CM | POA: Insufficient documentation

## 2018-08-09 DIAGNOSIS — K3184 Gastroparesis: Secondary | ICD-10-CM | POA: Insufficient documentation

## 2018-08-09 DIAGNOSIS — G8929 Other chronic pain: Secondary | ICD-10-CM | POA: Insufficient documentation

## 2018-08-09 DIAGNOSIS — IMO0002 Reserved for concepts with insufficient information to code with codable children: Secondary | ICD-10-CM

## 2018-08-09 DIAGNOSIS — K760 Fatty (change of) liver, not elsewhere classified: Secondary | ICD-10-CM | POA: Insufficient documentation

## 2018-08-09 LAB — POCT GLYCOSYLATED HEMOGLOBIN (HGB A1C): HbA1c, POC (controlled diabetic range): 7.7 % — AB (ref 0.0–7.0)

## 2018-08-09 LAB — GLUCOSE, POCT (MANUAL RESULT ENTRY): POC GLUCOSE: 193 mg/dL — AB (ref 70–99)

## 2018-08-09 MED ORDER — INSULIN GLARGINE 100 UNIT/ML SOLOSTAR PEN: PEN_INJECTOR | SUBCUTANEOUS | 12 refills | Status: DC

## 2018-08-09 MED ORDER — DULOXETINE HCL 20 MG PO CPEP: 20.0000 mg | ORAL_CAPSULE | Freq: Every day | ORAL | 3 refills | Status: DC

## 2018-08-09 MED FILL — DULoxetine HCL 20 MG CPEP: 20 | 30 days supply | Qty: 30 | Fill #0

## 2018-08-09 NOTE — Progress Notes (Signed)
Patient ID: Kelly Lang, female    DOB: 1961-07-11  MRN: [id number]  CC: Chronic disease management  Subjective: Kelly Lang is a 57 y.o. female who presents for chronic disease management. Her concerns today include:  Pt with hx of a.flutters/p ablation7/2018on Xarelto, DM type 2 , gastroparesis, obesity, HL, HTN, chronic systolic CHF with EF 09%, CVA.  DM: having a hard time getting BS down and "I get upset".  Does okay with eating habits.  If she snacks, it is usually on sugar free stuff.  She has cut out potatoes. -checks BS TID.a.m range 180-260, before lunch 160-220, and bedtime 300s.  She has had flareup of gastroparesis symptoms when blood sugars are high.  She also endorses numbness in the feet especially the plantar toes -walks in her neighborhood a little but not as much as she should.  She has not been swimming in her pool.  Patient very emotional and tearful.  I wake up hurting all the time.  "I'm tired, I just want it to stop.  I wake up and I don't even want to go to work some time" she finds that Tylenol and Voltaren gel does not help much anymore. Muscle in arms, wrist and ankles and feet all hurt.  No swelling in the jts.  No joint stiffness.  Denies feeling down or depressed.  Patient Active Problem List   Diagnosis Date Noted  . Gastroparesis 12/21/2017  . GI bleed 12/06/2017  . Hepatic steatosis 12/04/2017  . Chronic systolic CHF (congestive heart failure) ([place]) 09/16/2015  . Cerebrovascular accident (CVA) due to embolism of left middle cerebral artery ([place])   . Thyroid mass 09/15/2015  . Low back pain 04/03/2014  . Unspecified vitamin D deficiency 01/20/2014  . Essential hypertension, benign 01/20/2014  . Chronic anticoagulation 10/31/2013  . Tachycardia induced cardiomyopathy ([place])   . H/O: hypothyroidism   . Obesity   . IDDM (insulin dependent diabetes mellitus) ([place])   . Arthritis   . Dyslipidemia   . Atrial fibrillation  10/25/2013      Current Outpatient Medications on File Prior to Visit  Medication Sig Dispense Refill  . betamethasone dipropionate (DIPROLENE) 0.05 % cream Apply topically 2 (two) times daily.    . Blood Glucose Monitoring Suppl (ONE TOUCH ULTRA 2) w/Device KIT 1 application by Does not apply route 2 (two) times daily. 1 each 0  . clobetasol (TEMOVATE) 0.05 % external solution Apply 1 application topically daily as needed (scalp care). Apply small quarter size to affected area on scalp BID x 1 week then PRN 50 mL 3  . diclofenac sodium (VOLTAREN) 1 % GEL Apply 2 g topically 4 (four) times daily as needed (pain). 100 g 4  . glipiZIDE (GLUCOTROL) 10 MG tablet Take 1 tablet (10 mg total) by mouth 2 (two) times daily before a meal. MUST MAKE APPT FOR FURTHER REFILLS 60 tablet 0  . glucose blood (ONE TOUCH TEST STRIPS) test strip Use as instructed 100 each 12  . Insulin Pen Needle (ULTICARE MICRO PEN NEEDLES) 32G X 4 MM MISC 1 applicator by Does not apply route at bedtime. 100 each 2  . JARDIANCE 10 MG TABS tablet TAKE 1 TABLET BY MOUTH DAILY. 30 tablet 2  . Lancets (ONETOUCH ULTRASOFT) lancets Use as instructed 100 each 12  . lisinopril (PRINIVIL,ZESTRIL) 10 MG tablet Take 0.5 tablets (5 mg total) by mouth daily. 45 tablet 3  . lovastatin (MEVACOR) 20 MG tablet TAKE 1 TABLET BY MOUTH AT BEDTIME.  30 tablet 0  . metFORMIN (GLUCOPHAGE) 1000 MG tablet Take 1 tablet (1,000 mg total) by mouth 2 (two) times daily with a meal. 180 tablet 3  . metoCLOPramide (REGLAN) 10 MG tablet Take 10 mg by mouth every 8 (eight) hours as needed (STOMACH SPASM).    Marland Kitchen metoCLOPramide (REGLAN) 5 MG tablet Take 1 tab by mouth  30 min before meals as needed. 90 tablet 5  . metoprolol tartrate (LOPRESSOR) 25 MG tablet TAKE 1 & 1/2 TABLET BY MOUTH 2 TIMES DAILY 270 tablet 2  . omeprazole (PRILOSEC) 20 MG capsule Take 1 capsule by mouth once daily every morning. 30 capsule 5  . omeprazole (PRILOSEC) 40 MG capsule Take 1 capsule (40 mg total) by  mouth daily.    Marland Kitchen triamcinolone cream (KENALOG) 0.1 % Apply 1 application topically 2 (two) times daily as needed (rash). 45 g 1  . XARELTO 20 MG TABS tablet TAKE 1 TABLET BY MOUTH DAILY WITH SUPPER. 30 tablet 10   No current facility-administered medications on file prior to visit.     Allergies  Allergen Reactions  . Codeine Itching and Rash    Social History   Socioeconomic History  . Marital status: Divorced    Spouse name: Not on file  . Number of children: 0  . Years of education: 28  . Highest education level: Not on file  Occupational History  . Occupation: Chief Technology Officer  Social Needs  . Financial resource strain: Not on file  . Food insecurity:    Worry: Not on file    Inability: Not on file  . Transportation needs:    Medical: Not on file    Non-medical: Not on file  Tobacco Use  . Smoking status: Former Smoker    Packs/day: 1.00    Years: 22.00    Pack years: 22.00    Types: Cigarettes    Last attempt to quit: 12/01/2010    Years since quitting: 7.6  . Smokeless tobacco: Never Used  Substance and Sexual Activity  . Alcohol use: Yes    Comment: 06/30/2017 "glass of wine maybe q other month"  . Drug use: No  . Sexual activity: Not Currently  Lifestyle  . Physical activity:    Days per week: Not on file    Minutes per session: Not on file  . Stress: Not on file  Relationships  . Social connections:    Talks on phone: Not on file    Gets together: Not on file    Attends religious service: Not on file    Active member of club or organization: Not on file    Attends meetings of clubs or organizations: Not on file    Relationship status: Not on file  . Intimate partner violence:    Fear of current or ex partner: Not on file    Emotionally abused: Not on file    Physically abused: Not on file    Forced sexual activity: Not on file  Other Topics Concern  . Not on file  Social History Narrative   Moved to [place] from [place] in 2002.     Fun:  shop, sew, decorate   Denies any beliefs effecting healthcare    Family History  Adopted: Yes  Problem Relation Age of Onset  . Hypertension Mother   . Hyperlipidemia Mother     Past Surgical History:  Procedure Laterality Date  . ATRIAL FIBRILLATION ABLATION  06/30/2017  . ATRIAL FIBRILLATION ABLATION N/A 06/30/2017  Procedure: Atrial Fibrillation Ablation;  Surgeon: Thompson Grayer, MD;  Location: [facility];  Service: Cardiovascular;  Laterality: N/A;  . BIOPSY THYROID  1996  . CARDIOVERSION N/A 12/07/2013   Procedure: CARDIOVERSION;  Surgeon: Casandra Doffing, MD;  Location: [place];  Service: Cardiovascular;  Laterality: N/A;  . CARDIOVERSION N/A 03/10/2016   Procedure: CARDIOVERSION;  Surgeon: Thayer Headings, MD;  Location: [facility] ENDOSCOPY;  Service: Cardiovascular;  Laterality: N/A;  . ESOPHAGOGASTRODUODENOSCOPY N/A 12/07/2017   Procedure: ESOPHAGOGASTRODUODENOSCOPY (EGD);  Surgeon: Irene Shipper, MD;  Location[facility] ENDOSCOPY;  Service: Endoscopy;  Laterality: N/A;  . EYE MUSCLE SURGERY Right ~ 1966  . TEE WITHOUT CARDIOVERSION N/A 06/29/2017   Procedure: TRANSESOPHAGEAL ECHOCARDIOGRAM (TEE);  Surgeon: Fay Records, MD;  Location: [facility] ENDOSCOPY;  Service: Cardiovascular;  Laterality: N/A;  . TONSILLECTOMY  1971    ROS: Review of Systems Negative except as stated above PHYSICAL EXAM: BP 105/75   Pulse 68   Temp 98.4 F (36.9 C) (Oral)   Resp 16   Wt 249 lb 12.8 oz (113.3 kg)   SpO2 98%   BMI 40.94 kg/m   Wt Readings from Last 3 Encounters:  08/09/18 249 lb 12.8 oz (113.3 kg)  06/09/18 243 lb (110.2 kg)  04/29/18 247 lb 9.6 oz (112.3 kg)   Physical Exam  General appearance - alert, well appearing, middle-aged Caucasian female and in no distress Mental status -patient very emotional and tearful.  Neck - supple, no significant adenopathy Chest - clear to auscultation, no wheezes, rales or rhonchi, symmetric air entry Heart - normal rate, regular rhythm, normal  S1, S2, no murmurs, rubs, clicks or gallops Musculoskeletal -left shoulder good range of motion without limitation or apparent discomfort.  Right shoulder: No point tenderness.  Mild discomfort with elevation.  Drop arm test is negative.  No signs of inflammation at the elbows wrists fingers ankles.  She has good range of motion of these joints. Extremities -no lower extremity edema Diabetic Foot Exam - Simple   Simple Foot Form Visual Inspection No deformities, no ulcerations, no other skin breakdown bilaterally:  Yes Sensation Testing See comments:  Yes Pulse Check Posterior Tibialis and Dorsalis pulse intact bilaterally:  Yes Comments Decreased sensation on the plantar surface of the toes on the ball of the feet bilaterally     Depression screen [facility] 2/9 08/09/2018 04/29/2018 03/31/2018  Decreased Interest 0 0 0  Down, Depressed, Hopeless 0 0 0  PHQ - 2 Score 0 0 0  Altered sleeping - 0 0  Tired, decreased energy - 0 0  Change in appetite - 0 0  Feeling bad or failure about yourself  - 0 0  Trouble concentrating - 0 0  Moving slowly or fidgety/restless - 0 0  Suicidal thoughts - 0 0  PHQ-9 Score - 0 0    BS 193/A1C 7.7  ASSESSMENT AND PLAN: 1. Diabetes mellitus type 2, uncontrolled, with complications ([place]) Discussed the importance of healthy eating habits, regular aerobic exercise (at least 150 minutes a week as tolerated) and medication compliance to achieve or maintain control of diabetes. -Increase Lantus to 65 units daily with instructions of titrating by 2 units every 2 to 3 days until morning blood sugars are consistently less than 130. Give follow-up appointment with clinical pharmacist in 2 to 3 weeks. Refer to dietitian for nutritional counseling. - POCT glucose (manual entry) - POCT glycosylated hemoglobin (Hb A1C) - Lipid panel - Comprehensive metabolic panel - Microalbumin / creatinine urine  ratio - Amb ref to Medical Nutrition Therapy-MNT - Insulin Glargine  (LANTUS SOLOSTAR) 100 UNIT/ML Solostar Pen; Lantus 65 units subcut QHS.  Increase by 2 units Q 2-3 days until a.m blow sugars consistently less than 130.  Max of 75 units daily.  Dispense: 15 mL; Refill: 12  2. Essential hypertension At goal.  Continue current medications and low-salt diet.  3. Diabetic polyneuropathy associated with type 2 diabetes mellitus ([place]) Discussed good diabetic foot care including proper foot wear.  4. Polyarthralgia 5. Chronic pain disorder I recommend trying her with low-dose of Cymbalta and patient was agreeable to this. - DULoxetine (CYMBALTA) 20 MG capsule; Take 1 capsule (20 mg total) by mouth daily.  Dispense: 30 capsule; Refill: 3   6. Need for influenza vaccination - Flu Vaccine QUAD 6+ mos PF IM (Fluarix Quad PF)  Patient was given the opportunity to ask questions.  Patient verbalized understanding of the plan and was able to repeat key elements of the plan.   Orders Placed This Encounter  Procedures  . Flu Vaccine QUAD 6+ mos PF IM (Fluarix Quad PF)  . Lipid panel  . Comprehensive metabolic panel  . Microalbumin / creatinine urine ratio  . Amb ref to Medical Nutrition Therapy-MNT  . POCT glucose (manual entry)  . POCT glycosylated hemoglobin (Hb A1C)     Requested Prescriptions   Signed Prescriptions Disp Refills  . DULoxetine (CYMBALTA) 20 MG capsule 30 capsule 3    Sig: Take 1 capsule (20 mg total) by mouth daily.  . Insulin Glargine (LANTUS SOLOSTAR) 100 UNIT/ML Solostar Pen 15 mL 12    Sig: Lantus 65 units subcut QHS.  Increase by 2 units Q 2-3 days until a.m blow sugars consistently less than 130.  Max of 75 units daily.    Return in about 2 months (around 10/09/2018).  Karle Plumber, MD, FACP

## 2018-08-09 NOTE — Patient Instructions (Addendum)
Please give patient an appointment with Franky Macho our clinical pharmacist in 2 to 3 weeks  Increase Lantus to 65 units daily.  Titrate Lantus by 2 units every 2 to 3 days until morning blood sugars are consistently less than 130.  Maximum increase to 75 units daily.  You have been referred to nutritionist.  Try to exercise at least 3-4 times a week for 20 to 30 minutes.  Start Cymbalta 20 mg daily to help with chronic pain.  Influenza Virus Vaccine injection (Fluarix) What is this medicine? INFLUENZA VIRUS VACCINE (in floo EN zuh VAHY ruhs vak SEEN) helps to reduce the risk of getting influenza also known as the flu. This medicine may be used for other purposes; ask your health care provider or pharmacist if you have questions. COMMON BRAND NAME(S): Fluarix, Fluzone What should I tell my health care provider before I take this medicine? They need to know if you have any of these conditions: -bleeding disorder like hemophilia -fever or infection -Guillain-Barre syndrome or other neurological problems -immune system problems -infection with the human immunodeficiency virus (HIV) or AIDS -low blood platelet counts -multiple sclerosis -an unusual or allergic reaction to influenza virus vaccine, eggs, chicken proteins, latex, gentamicin, other medicines, foods, dyes or preservatives -pregnant or trying to get pregnant -breast-feeding How should I use this medicine? This vaccine is for injection into a muscle. It is given by a health care professional. A copy of Vaccine Information Statements will be given before each vaccination. Read this sheet carefully each time. The sheet may change frequently. Talk to your pediatrician regarding the use of this medicine in children. Special care may be needed. Overdosage: If you think you have taken too much of this medicine contact a poison control center or emergency room at once. NOTE: This medicine is only for you. Do not share this medicine with  others. What if I miss a dose? This does not apply. What may interact with this medicine? -chemotherapy or radiation therapy -medicines that lower your immune system like etanercept, anakinra, infliximab, and adalimumab -medicines that treat or prevent blood clots like warfarin -phenytoin -steroid medicines like prednisone or cortisone -theophylline -vaccines This list may not describe all possible interactions. Give your health care provider a list of all the medicines, herbs, non-prescription drugs, or dietary supplements you use. Also tell them if you smoke, drink alcohol, or use illegal drugs. Some items may interact with your medicine. What should I watch for while using this medicine? Report any side effects that do not go away within 3 days to your doctor or health care professional. Call your health care provider if any unusual symptoms occur within 6 weeks of receiving this vaccine. You may still catch the flu, but the illness is not usually as bad. You cannot get the flu from the vaccine. The vaccine will not protect against colds or other illnesses that may cause fever. The vaccine is needed every year. What side effects may I notice from receiving this medicine? Side effects that you should report to your doctor or health care professional as soon as possible: -allergic reactions like skin rash, itching or hives, swelling of the face, lips, or tongue Side effects that usually do not require medical attention (report to your doctor or health care professional if they continue or are bothersome): -fever -headache -muscle aches and pains -pain, tenderness, redness, or swelling at site where injected -weak or tired This list may not describe all possible side effects. Call your doctor for  medical advice about side effects. You may report side effects to FDA at 1-800-FDA-1088. Where should I keep my medicine? This vaccine is only given in a clinic, pharmacy, doctor's office, or other  health care setting and will not be stored at home. NOTE: This sheet is a summary. It may not cover all possible information. If you have questions about this medicine, talk to your doctor, pharmacist, or health care provider.  2018 Elsevier/Gold Standard (2008-06-14 09:30:40)

## 2018-08-10 LAB — COMPREHENSIVE METABOLIC PANEL
ALBUMIN: 4.1 g/dL (ref 3.5–5.5)
ALT: 22 IU/L (ref 0–32)
AST: 12 IU/L (ref 0–40)
Albumin/Globulin Ratio: 1.1 — ABNORMAL LOW (ref 1.2–2.2)
Alkaline Phosphatase: 72 IU/L (ref 39–117)
BILIRUBIN TOTAL: 0.4 mg/dL (ref 0.0–1.2)
BUN / CREAT RATIO: 23 (ref 9–23)
BUN: 16 mg/dL (ref 6–24)
CHLORIDE: 102 mmol/L (ref 96–106)
CO2: 20 mmol/L (ref 20–29)
Calcium: 9.5 mg/dL (ref 8.7–10.2)
Creatinine, Ser: 0.69 mg/dL (ref 0.57–1.00)
GFR calc non Af Amer: 98 mL/min/{1.73_m2} (ref >59)
GFR, EST AFRICAN AMERICAN: 113 mL/min/{1.73_m2} (ref >59)
GLUCOSE: 185 mg/dL — AB (ref 65–99)
Globulin, Total: 3.6 g/dL (ref 1.5–4.5)
Potassium: 4.8 mmol/L (ref 3.5–5.2)
Sodium: 137 mmol/L (ref 134–144)
TOTAL PROTEIN: 7.7 g/dL (ref 6.0–8.5)

## 2018-08-10 LAB — LIPID PANEL
CHOLESTEROL TOTAL: 176 mg/dL (ref 100–199)
Chol/HDL Ratio: 4.4 ratio (ref 0.0–4.4)
HDL: 40 mg/dL (ref >39)
LDL Calculated: 90 mg/dL (ref 0–99)
Triglycerides: 228 mg/dL — ABNORMAL HIGH (ref 0–149)
VLDL Cholesterol Cal: 46 mg/dL — ABNORMAL HIGH (ref 5–40)

## 2018-08-10 LAB — MICROALBUMIN / CREATININE URINE RATIO
Creatinine, Urine: 47.5 mg/dL
Microalb/Creat Ratio: 30.7 mg/g creat — ABNORMAL HIGH (ref 0.0–30.0)
Microalbumin, Urine: 14.6 ug/mL

## 2018-08-11 DIAGNOSIS — L400 Psoriasis vulgaris: Secondary | ICD-10-CM | POA: Diagnosis not present

## 2018-08-23 ENCOUNTER — Ambulatory Visit: Payer: BLUE CROSS/BLUE SHIELD | Attending: Family Medicine | Admitting: Pharmacist

## 2018-08-23 ENCOUNTER — Encounter: Payer: Self-pay | Admitting: Pharmacist

## 2018-08-23 DIAGNOSIS — Z8249 Family history of ischemic heart disease and other diseases of the circulatory system: Secondary | ICD-10-CM | POA: Insufficient documentation

## 2018-08-23 DIAGNOSIS — IMO0002 Reserved for concepts with insufficient information to code with codable children: Secondary | ICD-10-CM

## 2018-08-23 DIAGNOSIS — Z794 Long term (current) use of insulin: Secondary | ICD-10-CM | POA: Insufficient documentation

## 2018-08-23 DIAGNOSIS — Z87891 Personal history of nicotine dependence: Secondary | ICD-10-CM | POA: Insufficient documentation

## 2018-08-23 DIAGNOSIS — E119 Type 2 diabetes mellitus without complications: Secondary | ICD-10-CM | POA: Insufficient documentation

## 2018-08-23 DIAGNOSIS — E118 Type 2 diabetes mellitus with unspecified complications: Secondary | ICD-10-CM

## 2018-08-23 DIAGNOSIS — E1165 Type 2 diabetes mellitus with hyperglycemia: Secondary | ICD-10-CM | POA: Diagnosis not present

## 2018-08-23 DIAGNOSIS — Z79899 Other long term (current) drug therapy: Secondary | ICD-10-CM | POA: Insufficient documentation

## 2018-08-23 LAB — GLUCOSE, POCT (MANUAL RESULT ENTRY): POC GLUCOSE: 87 mg/dL (ref 70–99)

## 2018-08-23 MED ORDER — INSULIN GLARGINE 100 UNIT/ML SOLOSTAR PEN: 70.0000 [IU] | PEN_INJECTOR | Freq: Every day | SUBCUTANEOUS | 2 refills | Status: DC

## 2018-08-23 MED FILL — LANTUS SOLOSTAR 100 UNITS/M: 100 | 21 days supply | Qty: 15 | Fill #0

## 2018-08-23 NOTE — Patient Instructions (Signed)
Thank you for coming to see me today. Please do the following:  1. Continue 70 units of Lantus daily. Continue other medications.  2. Continue checking blood sugars at home.  3. Continue making the lifestyle changes we've discussed together during our visit. Diet and exercise play a significant role in improving your blood sugars.  4. Follow-up with me in 1 month.   Hypoglycemia or low blood sugar:   Low blood sugar can happen quickly and may become an emergency if not treated right away.   While this shouldn't happen often, it can be brought upon if you skip a meal or do not eat enough. Also, if your insulin or other diabetes medications are dosed too high, this can cause your blood sugar to go to low.   Warning signs of low blood sugar include: 1. Feeling shaky or dizzy 2. Feeling weak or tired  3. Excessive hunger 4. Feeling anxious or upset  5. Sweating even when you aren't exercising  What to do if I experience low blood sugar? 1. Check your blood sugar with your meter. If lower than 70, proceed to step 2.  2. Treat with 3-4 glucose tablets or 3 packets of regular sugar. If these aren't around, you can try hard candy. Yet another option would be to drink 4 ounces of fruit juice or 6 ounces of REGULAR soda.  3. Re-check your sugar in 15 minutes. If it is still below 70, do what you did in step 2 again. If has come back up, go ahead and eat a snack or small meal at this time.

## 2018-08-23 NOTE — Progress Notes (Addendum)
    S:    PCP: Dr. Laural Benes  No chief complaint on file.  Patient arrives in good spirits. Presents for diabetes management at the request of Dr. Laural Benes. Patient was referred and last seen by Dr. Laural Benes on 08/09/18. Pt was instructed to titrate Lantus by 2 units q2-3days to achieve fasting levels < 130.   Family/Social History:  - FH: HTN, hyperlipidemia (mother) - Tobacco: former smokers (quit 2012) - Alcohol: occasional glass of wine  Insurance coverage/medication affordability:  - BCBS  Patient reports adherence with medications.  Current diabetes medications include:  - Glipizide 10 mg BID - Jardiance 10 mg daily - Lantus 70 units - Metformin 1000 mg BID  Patient reports hypoglycemic 1 event. "Felt it coming on" -- successfully treated with orange juice   Patient reported dietary habits:  - Reports that she's following a diabet diet - Knows to limit carbs but has occasional slip (potatoes, pecan pie) - Sees nutritionist Monday  Patient-reported exercise habits:  - Walks 10 minutes to get the mail/daily   Patient denies nocturia.  Patient denies neuropathy. Patient denies visual changes. Patient reports self foot exams.   O:  POCT: 87. Fasting. Home fasting CBG: 112-133  2 hour post-prandial/random CBG: 114-176  Lab Results  Component Value Date   HGBA1C 7.7 (A) 08/09/2018   There were no vitals filed for this visit.  Lipid Panel     Component Value Date/Time   CHOL 176 08/09/2018 0941   TRIG 228 (H) 08/09/2018 0941   HDL 40 08/09/2018 0941   CHOLHDL 4.4 08/09/2018 0941   CHOLHDL 5.2 (H) 11/19/2016 1103   VLDL 59 (H) 11/19/2016 1103   LDLCALC 90 08/09/2018 0941   LDLDIRECT 114.0 02/13/2015 0750   Clinical ASCVD: Yes    A/P: Diabetes longstanding currently uncontrolled but very close to goal with A1c of 7.7. Patient is able to verbalize appropriate hypoglycemia management plan. Patient is adherent with medication. Home glucose levels and today's  clinic value at goal. Anticipate that her next A1c will reveal this. Patient has successfully titrated insulin to 70 units daily with fasting levels consistently <130.  -Continued basal insulin Lantus (insulin glargine) 70 units daily  -Continued  Metformin 1000 mg BID -Continued Jardiance 10 mg daily -Extensively discussed pathophysiology of DM, recommended lifestyle interventions, dietary effects on glycemic control -Counseled on s/sx of and management of hypoglycemia -Next A1C anticipated 10/2018.   ASCVD risk - secondary prevention in patient with DM and prior stroke. Last LDL > 70 controlled. High intensity statin indicated but patient is taking low-intensity with lovastatin 20 mg daily.  -Continued lovastatin 20 mg for now. Recommend to switch to high intensity statin if tolerable. Will discuss at future encounter.   Written patient instructions provided.  Total time in face to face counseling 15 minutes.   Follow up Pharmacist Clinic Visit in 1 month.     Patient seen with:  Mosie Lukes, PharmD Candidate [employer] of 2021  Butch Penny, PharmD, CPP Clinical Pharmacist [facility] [phone]

## 2018-08-30 ENCOUNTER — Ambulatory Visit (HOSPITAL_COMMUNITY)
Admission: EM | Admit: 2018-08-30 | Discharge: 2018-08-30 | Disposition: A | Discharge status: Home / Self Care | Payer: BLUE CROSS/BLUE SHIELD | Attending: Family Medicine | Admitting: Family Medicine

## 2018-08-30 ENCOUNTER — Encounter (HOSPITAL_COMMUNITY): Payer: Self-pay | Admitting: Emergency Medicine

## 2018-08-30 ENCOUNTER — Ambulatory Visit: Payer: Self-pay | Admitting: Registered"

## 2018-08-30 DIAGNOSIS — I110 Hypertensive heart disease with heart failure: Secondary | ICD-10-CM | POA: Insufficient documentation

## 2018-08-30 DIAGNOSIS — Z885 Allergy status to narcotic agent status: Secondary | ICD-10-CM | POA: Insufficient documentation

## 2018-08-30 DIAGNOSIS — Z8673 Personal history of transient ischemic attack (TIA), and cerebral infarction without residual deficits: Secondary | ICD-10-CM | POA: Insufficient documentation

## 2018-08-30 DIAGNOSIS — K3184 Gastroparesis: Secondary | ICD-10-CM | POA: Insufficient documentation

## 2018-08-30 DIAGNOSIS — E1142 Type 2 diabetes mellitus with diabetic polyneuropathy: Secondary | ICD-10-CM | POA: Insufficient documentation

## 2018-08-30 DIAGNOSIS — I481 Persistent atrial fibrillation: Secondary | ICD-10-CM | POA: Diagnosis not present

## 2018-08-30 DIAGNOSIS — E7800 Pure hypercholesterolemia, unspecified: Secondary | ICD-10-CM | POA: Insufficient documentation

## 2018-08-30 DIAGNOSIS — J069 Acute upper respiratory infection, unspecified: Secondary | ICD-10-CM | POA: Diagnosis not present

## 2018-08-30 DIAGNOSIS — M1990 Unspecified osteoarthritis, unspecified site: Secondary | ICD-10-CM | POA: Insufficient documentation

## 2018-08-30 DIAGNOSIS — B9789 Other viral agents as the cause of diseases classified elsewhere: Secondary | ICD-10-CM | POA: Diagnosis not present

## 2018-08-30 DIAGNOSIS — Z794 Long term (current) use of insulin: Secondary | ICD-10-CM | POA: Insufficient documentation

## 2018-08-30 DIAGNOSIS — I5022 Chronic systolic (congestive) heart failure: Secondary | ICD-10-CM | POA: Diagnosis not present

## 2018-08-30 DIAGNOSIS — E669 Obesity, unspecified: Secondary | ICD-10-CM | POA: Insufficient documentation

## 2018-08-30 DIAGNOSIS — G8929 Other chronic pain: Secondary | ICD-10-CM | POA: Insufficient documentation

## 2018-08-30 DIAGNOSIS — E039 Hypothyroidism, unspecified: Secondary | ICD-10-CM | POA: Insufficient documentation

## 2018-08-30 DIAGNOSIS — R05 Cough: Secondary | ICD-10-CM | POA: Diagnosis not present

## 2018-08-30 DIAGNOSIS — I429 Cardiomyopathy, unspecified: Secondary | ICD-10-CM | POA: Diagnosis not present

## 2018-08-30 DIAGNOSIS — K760 Fatty (change of) liver, not elsewhere classified: Secondary | ICD-10-CM | POA: Diagnosis not present

## 2018-08-30 DIAGNOSIS — Z87891 Personal history of nicotine dependence: Secondary | ICD-10-CM | POA: Diagnosis not present

## 2018-08-30 DIAGNOSIS — Z79899 Other long term (current) drug therapy: Secondary | ICD-10-CM | POA: Diagnosis not present

## 2018-08-30 DIAGNOSIS — Z7901 Long term (current) use of anticoagulants: Secondary | ICD-10-CM | POA: Diagnosis not present

## 2018-08-30 DIAGNOSIS — J029 Acute pharyngitis, unspecified: Secondary | ICD-10-CM | POA: Diagnosis present

## 2018-08-30 LAB — POCT RAPID STREP A: Streptococcus, Group A Screen (Direct): NEGATIVE

## 2018-08-30 MED ORDER — BENZONATATE 100 MG PO CAPS: 100.0000 mg | ORAL_CAPSULE | Freq: Three times a day (TID) | ORAL | 0 refills | Status: DC

## 2018-08-30 MED ORDER — GUAIFENESIN 100 MG/5ML PO LIQD: 100.0000 mg | ORAL | 0 refills | Status: DC | PRN

## 2018-08-30 MED ORDER — CETIRIZINE HCL 5 MG PO TABS: 5.0000 mg | ORAL_TABLET | Freq: Every day | ORAL | 1 refills | Status: DC

## 2018-08-30 MED FILL — BENZONATATE 100 MG CAP: 100 | 7 days supply | Qty: 21 | Fill #0

## 2018-08-30 NOTE — ED Triage Notes (Signed)
Pt c/o cough, sore throat, fever, chest congestion since friday

## 2018-08-30 NOTE — Discharge Instructions (Addendum)
°  It was nice meeting you!!  I believe you have a viral upper respiratory infection. It could take 7 to 10 days for the symptoms to decrease or improve.  We will treat your cough with Tessalon Perles. Zyrtec and mucinex for the drainage in your throat and congestion.  Ibuprofen and tylenol can help with the pain.  Chloraseptic throat spray or lozenges are another option for symptoms relief.  Warm tea with honey can help.  Follow up as needed for worsening symptoms.

## 2018-08-30 NOTE — ED Provider Notes (Signed)
[place]    CSN: [id number] Arrival date & time: 08/30/18  9147     History   Chief Complaint Chief Complaint  Patient presents with  . Sore Throat  . Cough    HPI Arcelia Pals is a 57 y.o. female.   Patient is a 57 year old female with past medical history of A. fib, allergies, arthritis, cholesterol, hypertension, obesity, stroke, CHF, diabetes, thyroid disease. She presents with 4 days of sore throat, cough, congestion, ear fullness.  Symptoms have been constant and worsening.  she has been using over-the-counter cold and flu medication for symptoms with minimal relief. She denies any chest pain or shortness of breath.  She denies any lower extremity swelling.   ROS per HPI      Past Medical History:  Diagnosis Date  . Arthritis    "all over" (06/30/2017)  . Chicken pox   . Dyslipidemia   . H/O: hypothyroidism    a. thyroid biopsy 1996 with synthroid. Stopped taking rx and was loss to follow up b. normal thyroid function 10/20/13  . High cholesterol   . Hx of cardiovascular stress test    ETT/Lexiscan Myoview (12/2013):  No ischemia; not gated; low risk.  Marland Kitchen Hypertension   . Obesity   . Persistent atrial fibrillation ([place])    a diagnosed 10/25/2013  . Seasonal allergies   . Stroke [facility]) 2016   "some speech problems since" (06/30/2017)  . 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 2/2 Afib with RVR for unknown duration (EF: 40-45%)  . Thyroid disease   . Type II diabetes mellitus ([place])    a. hg A1c 10, newly diagnosed (10/26/13)    Patient Active Problem List   Diagnosis Date Noted  . Chronic pain disorder 08/09/2018  . Diabetic polyneuropathy associated with type 2 diabetes mellitus ([place]) 08/09/2018  . Gastroparesis 12/21/2017  . GI bleed 12/06/2017  . Hepatic steatosis 12/04/2017  . Chronic systolic CHF (congestive heart failure) ([place]) 09/16/2015  . Cerebrovascular accident (CVA) due to embolism of left middle cerebral artery ([place])     . Thyroid mass 09/15/2015  . Low back pain 04/03/2014  . Unspecified vitamin D deficiency 01/20/2014  . Essential hypertension, benign 01/20/2014  . Chronic anticoagulation 10/31/2013  . Tachycardia induced cardiomyopathy ([place])   . H/O: hypothyroidism   . Obesity   . IDDM (insulin dependent diabetes mellitus) ([place])   . Arthritis   . Dyslipidemia   . Atrial fibrillation  10/25/2013    Past Surgical History:  Procedure Laterality Date  . ATRIAL FIBRILLATION ABLATION  06/30/2017  . ATRIAL FIBRILLATION ABLATION N/A 06/30/2017   Procedure: Atrial Fibrillation Ablation;  Surgeon: Thompson Grayer, MD;  Location: [place] CV LAB;  Service: Cardiovascular;  Laterality: N/A;  . BIOPSY THYROID  1996  . CARDIOVERSION N/A 12/07/2013   Procedure: CARDIOVERSION;  Surgeon: Casandra Doffing, MD;  Location: [place];  Service: Cardiovascular;  Laterality: N/A;  . CARDIOVERSION N/A 03/10/2016   Procedure: CARDIOVERSION;  Surgeon: Thayer Headings, MD;  Location: [facility] ENDOSCOPY;  Service: Cardiovascular;  Laterality: N/A;  . ESOPHAGOGASTRODUODENOSCOPY N/A 12/07/2017   Procedure: ESOPHAGOGASTRODUODENOSCOPY (EGD);  Surgeon: Irene Shipper, MD;  Location: [facility] ENDOSCOPY;  Service: Endoscopy;  Laterality: N/A;  . EYE MUSCLE SURGERY Right ~ 1966  . TEE WITHOUT CARDIOVERSION N/A 06/29/2017   Procedure: TRANSESOPHAGEAL ECHOCARDIOGRAM (TEE);  Surgeon: Fay Records, MD;  Location: [place];  Service: Cardiovascular;  Laterality: N/A;  . TONSILLECTOMY  1971    OB History  None      Home Medications    Prior to Admission medications   Medication Sig Start Date End Date Taking? Authorizing Provider  benzonatate (TESSALON) 100 MG capsule Take 1 capsule (100 mg total) by mouth every 8 (eight) hours. 08/30/18   Loura Halt A, NP  betamethasone dipropionate (DIPROLENE) 0.05 % cream Apply topically 2 (two) times daily.    [provider]  Blood Glucose Monitoring Suppl (ONE TOUCH ULTRA 2) w/Device KIT 1  application by Does not apply route 2 (two) times daily. 11/19/16   Maren Reamer, MD  cetirizine (ZYRTEC) 5 MG tablet Take 1 tablet (5 mg total) by mouth daily. 08/30/18   Happy Begeman, Tressia Miners A, NP  clobetasol (TEMOVATE) 0.05 % external solution Apply 1 application topically daily as needed (scalp care). Apply small quarter size to affected area on scalp BID x 1 week then PRN 03/31/18   Argentina Donovan, PA-C  diclofenac sodium (VOLTAREN) 1 % GEL Apply 2 g topically 4 (four) times daily as needed (pain). 04/29/18   Ladell Pier, MD  DULoxetine (CYMBALTA) 20 MG capsule Take 1 capsule (20 mg total) by mouth daily. 08/09/18   Ladell Pier, MD  glipiZIDE (GLUCOTROL) 10 MG tablet Take 1 tablet (10 mg total) by mouth 2 (two) times daily before a meal. MUST MAKE APPT FOR FURTHER REFILLS 07/28/18   Ladell Pier, MD  glucose blood (ONE TOUCH TEST STRIPS) test strip Use as instructed 11/19/16   Lottie Mussel T, MD  guaiFENesin (ROBITUSSIN) 100 MG/5ML liquid Take 5-10 mLs (100-200 mg total) by mouth every 4 (four) hours as needed for cough. 08/30/18   Loura Halt A, NP  Insulin Glargine (LANTUS SOLOSTAR) 100 UNIT/ML Solostar Pen Inject 70 Units into the skin daily. 08/23/18   Ladell Pier, MD  Insulin Pen Needle (ULTICARE MICRO PEN NEEDLES) 32G X 4 MM MISC 1 applicator by Does not apply route at bedtime. 11/19/16   Langeland, Dawn T, MD  JARDIANCE 10 MG TABS tablet TAKE 1 TABLET BY MOUTH DAILY. 11/11/17   Ladell Pier, MD  Lancets [facility] ULTRASOFT) lancets Use as instructed 11/19/16   Lottie Mussel T, MD  lisinopril (PRINIVIL,ZESTRIL) 10 MG tablet Take 0.5 tablets (5 mg total) by mouth daily. 12/21/17   Ladell Pier, MD  lovastatin (MEVACOR) 20 MG tablet TAKE 1 TABLET BY MOUTH AT BEDTIME. 07/28/18   Ladell Pier, MD  metFORMIN (GLUCOPHAGE) 1000 MG tablet Take 1 tablet (1,000 mg total) by mouth 2 (two) times daily with a meal. 12/21/17   Ladell Pier, MD  metoCLOPramide  (REGLAN) 10 MG tablet Take 10 mg by mouth every 8 (eight) hours as needed (STOMACH SPASM).    [provider]  metoCLOPramide (REGLAN) 5 MG tablet Take 1 tab by mouth  30 min before meals as needed. 06/09/18   Esterwood, Amy S, PA-C  metoprolol tartrate (LOPRESSOR) 25 MG tablet TAKE 1 & 1/2 TABLET BY MOUTH 2 TIMES DAILY 07/28/18   Nahser, Wonda Cheng, MD  omeprazole (PRILOSEC) 20 MG capsule Take 1 capsule by mouth once daily every morning. 06/09/18   Esterwood, Amy S, PA-C  omeprazole (PRILOSEC) 40 MG capsule Take 1 capsule (40 mg total) by mouth daily. 12/09/17   Hongalgi, Lenis Dickinson, MD  triamcinolone cream (KENALOG) 0.1 % Apply 1 application topically 2 (two) times daily as needed (rash). 04/29/18   Ladell Pier, MD  XARELTO 20 MG TABS tablet TAKE 1 TABLET BY MOUTH DAILY  WITH SUPPER. 09/22/17   Thompson Grayer, MD    Family History Family History  Adopted: Yes  Problem Relation Age of Onset  . Hypertension Mother   . Hyperlipidemia Mother     Social History Social History   Tobacco Use  . Smoking status: Former Smoker    Packs/day: 1.00    Years: 22.00    Pack years: 22.00    Types: Cigarettes    Last attempt to quit: 12/01/2010    Years since quitting: 7.7  . Smokeless tobacco: Never Used  Substance Use Topics  . Alcohol use: Yes    Comment: 06/30/2017 "glass of wine maybe q other month"  . Drug use: No     Allergies   Codeine   Review of Systems Review of Systems   Physical Exam Triage Vital Signs ED Triage Vitals  Enc Vitals Group     BP 08/30/18 1034 110/77     Pulse Rate 08/30/18 1033 99     Resp 08/30/18 1033 16     Temp 08/30/18 1033 98.6 F (37 C)     Temp src --      SpO2 08/30/18 1033 96 %     Weight --      Height --      Head Circumference --      Peak Flow --      Pain Score --      Pain Loc --      Pain Edu? --      Excl. in [place]? --    No data found.  Updated Vital Signs BP 110/77   Pulse 99   Temp 98.6 F (37 C)   Resp 16   SpO2  96%   Visual Acuity Right Eye Distance:   Left Eye Distance:   Bilateral Distance:    Right Eye Near:   Left Eye Near:    Bilateral Near:     Physical Exam  Constitutional: She appears well-developed and well-nourished.  Very pleasant. Non toxic or ill appearing.    HENT:  Head: Normocephalic and atraumatic.  Right Ear: Hearing normal.  Left Ear: Hearing normal.  Mouth/Throat: Mucous membranes are normal. Tonsils are 0 on the right. Tonsils are 0 on the left. No tonsillar exudate.  Bilateral TMs normal.  External ears normal.  Mild posterior oropharyngeal erythema, without tonsillar swelling or exudates. No lesions.      Neck: Normal range of motion.  Cardiovascular: Normal rate and normal heart sounds.  Pulmonary/Chest: Effort normal and breath sounds normal.  Lungs clear in all fields. No dyspnea or distress. No retractions or nasal flaring.     Abdominal: Soft.  Lymphadenopathy:    She has no cervical adenopathy.  Neurological: She is alert.  Skin: Skin is warm and dry.  Psychiatric: She has a normal mood and affect.  Nursing note and vitals reviewed.    UC Treatments / Results  Labs (all labs ordered are listed, but only abnormal results are displayed) Labs Reviewed  CULTURE, GROUP A STREP [facility])  POCT RAPID STREP A    EKG None  Radiology No results found.  Procedures Procedures (including critical care time)  Medications Ordered in UC Medications - No data to display  Initial Impression / Assessment and Plan / UC Course  I have reviewed the triage vital signs and the nursing notes.  Pertinent labs & imaging results that were available during my care of the patient were reviewed by me and considered in my medical  decision making (see chart for details).     Viral URI- symptomatic treatment, mediations sent to the pharmacy.  rapid strep negative. Will send for culture.  Follow up as needed for continued or worsening symptoms  Final Clinical  Impressions(s) / UC Diagnoses   Final diagnoses:  Viral upper respiratory tract infection     Discharge Instructions      It was nice meeting you!!  I believe you have a viral upper respiratory infection. It could take 7 to 10 days for the symptoms to decrease or improve.  We will treat your cough with Tessalon Perles. Zyrtec and mucinex for the drainage in your throat and congestion.  Ibuprofen and tylenol can help with the pain.  Chloraseptic throat spray or lozenges are another option for symptoms relief.  Warm tea with honey can help.  Follow up as needed for worsening symptoms.      ED Prescriptions    Medication Sig Dispense Auth. Provider   cetirizine (ZYRTEC) 5 MG tablet Take 1 tablet (5 mg total) by mouth daily. 30 tablet Yukio Bisping A, NP   guaiFENesin (ROBITUSSIN) 100 MG/5ML liquid Take 5-10 mLs (100-200 mg total) by mouth every 4 (four) hours as needed for cough. 60 mL Gabbrielle Mcnicholas A, NP   benzonatate (TESSALON) 100 MG capsule Take 1 capsule (100 mg total) by mouth every 8 (eight) hours. 21 capsule Loura Halt A, NP     Controlled Substance Prescriptions [place] Controlled Substance Registry consulted? Not Applicable   Orvan July, NP 08/30/18 1101

## 2018-09-01 LAB — CULTURE, GROUP A STREP (THRC)

## 2018-09-06 ENCOUNTER — Other Ambulatory Visit: Payer: Self-pay | Admitting: Internal Medicine

## 2018-09-06 DIAGNOSIS — E1165 Type 2 diabetes mellitus with hyperglycemia: Secondary | ICD-10-CM

## 2018-09-06 DIAGNOSIS — E118 Type 2 diabetes mellitus with unspecified complications: Principal | ICD-10-CM

## 2018-09-06 DIAGNOSIS — IMO0002 Reserved for concepts with insufficient information to code with codable children: Secondary | ICD-10-CM

## 2018-09-06 MED FILL — glipiZIDE 10 MG TABS: 10 | 30 days supply | Qty: 60 | Fill #0

## 2018-09-06 MED FILL — XARELTO 20 MG TABLET: 20 | 30 days supply | Qty: 30 | Fill #0

## 2018-09-06 MED FILL — LISINOPRIL 10 MG TABS: 10 | 90 days supply | Qty: 45 | Fill #8

## 2018-09-06 MED FILL — DULoxetine HCL 20 MG CPEP: 20 | 30 days supply | Qty: 30 | Fill #1

## 2018-09-15 DIAGNOSIS — L400 Psoriasis vulgaris: Secondary | ICD-10-CM | POA: Diagnosis not present

## 2018-09-20 MED FILL — JARDIANCE 10 MG TABLET: 10 | 30 days supply | Qty: 30 | Fill #3

## 2018-09-20 MED FILL — OMEPRAZOLE 20 MG CAP: 20 | 30 days supply | Qty: 30 | Fill #2

## 2018-09-20 MED FILL — DICLOFENAC SODIUM 1% GEL: 1 | 12 days supply | Qty: 100 | Fill #2

## 2018-09-20 MED FILL — CLOBETASOL 0.05% SOLUTION: 0.05 | 25 days supply | Qty: 50 | Fill #2

## 2018-09-20 MED FILL — METOCLOPRAMIDE 5 MG TABLET: 5 | 30 days supply | Qty: 30 | Fill #2

## 2018-09-20 MED FILL — LANTUS SOLOSTAR 100 UNITS/M: 100 | 21 days supply | Qty: 15 | Fill #1

## 2018-09-27 ENCOUNTER — Ambulatory Visit: Payer: BLUE CROSS/BLUE SHIELD | Admitting: Pharmacist

## 2018-10-04 ENCOUNTER — Ambulatory Visit: Payer: BLUE CROSS/BLUE SHIELD | Admitting: Pharmacist

## 2018-10-04 MED FILL — XARELTO 20 MG TABLET: 20 | 30 days supply | Qty: 30 | Fill #1

## 2018-10-04 MED FILL — glipiZIDE 10 MG TABS: 10 | 30 days supply | Qty: 60 | Fill #1

## 2018-10-04 MED FILL — DULoxetine HCL 20 MG CPEP: 20 | 30 days supply | Qty: 30 | Fill #2

## 2018-10-05 ENCOUNTER — Ambulatory Visit: Payer: BLUE CROSS/BLUE SHIELD | Attending: Internal Medicine | Admitting: Pharmacist

## 2018-10-05 ENCOUNTER — Encounter: Payer: Self-pay | Admitting: Pharmacist

## 2018-10-05 DIAGNOSIS — Z7984 Long term (current) use of oral hypoglycemic drugs: Secondary | ICD-10-CM | POA: Insufficient documentation

## 2018-10-05 DIAGNOSIS — E1165 Type 2 diabetes mellitus with hyperglycemia: Secondary | ICD-10-CM | POA: Diagnosis not present

## 2018-10-05 DIAGNOSIS — IMO0002 Reserved for concepts with insufficient information to code with codable children: Secondary | ICD-10-CM

## 2018-10-05 DIAGNOSIS — Z87891 Personal history of nicotine dependence: Secondary | ICD-10-CM | POA: Diagnosis not present

## 2018-10-05 DIAGNOSIS — E118 Type 2 diabetes mellitus with unspecified complications: Secondary | ICD-10-CM

## 2018-10-05 LAB — GLUCOSE, POCT (MANUAL RESULT ENTRY): POC Glucose: 108 mg/dl — AB (ref 70–99)

## 2018-10-05 MED ORDER — GLUCOSE BLOOD VI STRP: ORAL_STRIP | 11 refills | Status: DC

## 2018-10-05 MED ORDER — ONETOUCH DELICA LANCETS 33G MISC: 11 refills | Status: DC

## 2018-10-05 MED ORDER — ONETOUCH VERIO FLEX SYSTEM W/DEVICE KIT: PACK | 0 refills | Status: DC

## 2018-10-05 MED ORDER — ONETOUCH DELICA LANCETS 33G MISC: 11 refills | Status: AC

## 2018-10-05 MED ORDER — ATORVASTATIN CALCIUM 40 MG PO TABS: 40.0000 mg | ORAL_TABLET | Freq: Every day | ORAL | 2 refills | Status: DC

## 2018-10-05 MED FILL — ATORVASTATIN CALCIUM 40 MG: 40 | 30 days supply | Qty: 30 | Fill #0

## 2018-10-05 NOTE — Progress Notes (Signed)
    S:    PCP: Dr. Laural Benes  No chief complaint on file.  Patient arrives in good spirits. Presents for diabetes management at the request of Dr. Laural Benes. Patient was referred and last seen by Dr. Laural Benes on 08/09/18. I last saw her 08/23/18. No changes made to medications.   Family/Social History:  - FH: HTN, hyperlipidemia (mother) - Tobacco: former smokers (quit 2012) - Alcohol: occasional glass of wine  Insurance coverage/medication affordability:  - BCBS  Patient reports adherence with medications.  Current diabetes medications include:  - Glipizide 10 mg BID - Jardiance 10 mg daily - Lantus 70 units - Metformin 1000 mg BID  Patient denies hypoglycemia.   Patient reported dietary habits:  - Reports that she's following a diabet diet - Knows to limit carbs but has occasional slip (potatoes, pecan pie)  Patient-reported exercise habits:  - Walks 10 minutes to get the mail/daily   Patient denies nocturia.  Patient denies neuropathy. Patient denies visual changes. Patient reports self foot exams.   O:  POCT: 108 - Fasting Home levels reported high 100s-200s. Pt is using older meter.    Lab Results  Component Value Date   HGBA1C 7.7 (A) 08/09/2018   There were no vitals filed for this visit.  Lipid Panel     Component Value Date/Time   CHOL 176 08/09/2018 0941   TRIG 228 (H) 08/09/2018 0941   HDL 40 08/09/2018 0941   CHOLHDL 4.4 08/09/2018 0941   CHOLHDL 5.2 (H) 11/19/2016 1103   VLDL 59 (H) 11/19/2016 1103   LDLCALC 90 08/09/2018 0941   LDLDIRECT 114.0 02/13/2015 0750   Clinical ASCVD: Yes    A/P: Diabetes longstanding currently uncontrolled but very close to goal with A1c of 7.7. Patient is able to verbalize appropriate hypoglycemia management plan. Patient is adherent with medication. Home glucose levels elevated but this may be due to faulty meter. Will send in new meter to [facility] pharmacy. Patient provided with coupon to obtain this for free.   Of  note, patient would not be a candidate for GLP-1 RA d/t impaired gastric emptying. These medications have not been studied in patients with preexisting gastroparesis. If she were to require additional A1c lowering, would recommend against GLP-1 RA.   -Continued basal insulin Lantus (insulin glargine) 70 units daily  -Continued  Metformin 1000 mg BID -Continued Jardiance 10 mg daily -Continued glipizide 10 mg BID -Extensively discussed pathophysiology of DM, recommended lifestyle interventions, dietary effects on glycemic control -Counseled on s/sx of and management of hypoglycemia -Next A1C anticipated 10/2018.   ASCVD risk - secondary prevention in patient with DM and prior stroke. Last LDL > 70. Patient is a candidate for high-intensity statin. Will stop lovastatin and start atorvastatin 40 mg. Previous stroke cardioembolic with Afib. She is anticoagulated with Xarelto. ASA not indicated at this time.  - Stop Lovastatin - Start atorvastatin 40 mg daily  Written patient instructions provided.  Total time in face to face counseling 15 minutes.   Follow up Pharmacist Clinic Visit in 1 month.     Butch Penny, PharmD, CPP Clinical Pharmacist [facility] [phone]

## 2018-10-05 NOTE — Patient Instructions (Signed)
Thank you for coming to see me today. Please do the following:  1. Continue metformin, Jardiance, glipizide, and Lantus.  2. Continue checking blood sugars at home. 3. Continue making the lifestyle changes we've discussed together during our visit. Diet and exercise play a significant role in improving your blood sugars.  4. Follow-up with PCP.   Hypoglycemia or low blood sugar:   Low blood sugar can happen quickly and may become an emergency if not treated right away.   While this shouldn't happen often, it can be brought upon if you skip a meal or do not eat enough. Also, if your insulin or other diabetes medications are dosed too high, this can cause your blood sugar to go to low.   Warning signs of low blood sugar include: 1. Feeling shaky or dizzy 2. Feeling weak or tired  3. Excessive hunger 4. Feeling anxious or upset  5. Sweating even when you aren't exercising  What to do if I experience low blood sugar? 1. Check your blood sugar with your meter. If lower than 70, proceed to step 2.  2. Treat with 3-4 glucose tablets or 3 packets of regular sugar. If these aren't around, you can try hard candy. Yet another option would be to drink 4 ounces of fruit juice or 6 ounces of REGULAR soda.  3. Re-check your sugar in 15 minutes. If it is still below 70, do what you did in step 2 again. If has come back up, go ahead and eat a snack or small meal at this time.

## 2018-10-11 ENCOUNTER — Ambulatory Visit: Payer: BLUE CROSS/BLUE SHIELD | Admitting: Internal Medicine

## 2018-10-11 ENCOUNTER — Ambulatory Visit (HOSPITAL_COMMUNITY)
Admission: RE | Admit: 2018-10-11 | Discharge: 2018-10-11 | Disposition: A | Discharge status: Home / Self Care | Payer: BLUE CROSS/BLUE SHIELD | Source: Ambulatory Visit | Attending: Internal Medicine | Admitting: Internal Medicine

## 2018-10-11 ENCOUNTER — Encounter: Payer: Self-pay | Admitting: Internal Medicine

## 2018-10-11 VITALS — BP 130/82 | HR 61 | Temp 98.5°F | Resp 16 | Wt 248.0 lb

## 2018-10-11 DIAGNOSIS — E669 Obesity, unspecified: Secondary | ICD-10-CM | POA: Insufficient documentation

## 2018-10-11 DIAGNOSIS — E1142 Type 2 diabetes mellitus with diabetic polyneuropathy: Secondary | ICD-10-CM

## 2018-10-11 DIAGNOSIS — Z885 Allergy status to narcotic agent status: Secondary | ICD-10-CM

## 2018-10-11 DIAGNOSIS — Z8349 Family history of other endocrine, nutritional and metabolic diseases: Secondary | ICD-10-CM | POA: Insufficient documentation

## 2018-10-11 DIAGNOSIS — E785 Hyperlipidemia, unspecified: Secondary | ICD-10-CM | POA: Insufficient documentation

## 2018-10-11 DIAGNOSIS — Z7901 Long term (current) use of anticoagulants: Secondary | ICD-10-CM | POA: Insufficient documentation

## 2018-10-11 DIAGNOSIS — G8929 Other chronic pain: Secondary | ICD-10-CM | POA: Insufficient documentation

## 2018-10-11 DIAGNOSIS — Z87891 Personal history of nicotine dependence: Secondary | ICD-10-CM

## 2018-10-11 DIAGNOSIS — I5022 Chronic systolic (congestive) heart failure: Secondary | ICD-10-CM

## 2018-10-11 DIAGNOSIS — Z8249 Family history of ischemic heart disease and other diseases of the circulatory system: Secondary | ICD-10-CM

## 2018-10-11 DIAGNOSIS — I110 Hypertensive heart disease with heart failure: Secondary | ICD-10-CM

## 2018-10-11 DIAGNOSIS — I10 Essential (primary) hypertension: Secondary | ICD-10-CM

## 2018-10-11 DIAGNOSIS — M2550 Pain in unspecified joint: Secondary | ICD-10-CM

## 2018-10-11 DIAGNOSIS — M25511 Pain in right shoulder: Secondary | ICD-10-CM | POA: Insufficient documentation

## 2018-10-11 DIAGNOSIS — Z794 Long term (current) use of insulin: Secondary | ICD-10-CM | POA: Diagnosis not present

## 2018-10-11 DIAGNOSIS — E118 Type 2 diabetes mellitus with unspecified complications: Secondary | ICD-10-CM

## 2018-10-11 DIAGNOSIS — K3184 Gastroparesis: Secondary | ICD-10-CM

## 2018-10-11 DIAGNOSIS — Z9889 Other specified postprocedural states: Secondary | ICD-10-CM | POA: Insufficient documentation

## 2018-10-11 DIAGNOSIS — M19011 Primary osteoarthritis, right shoulder: Secondary | ICD-10-CM | POA: Insufficient documentation

## 2018-10-11 DIAGNOSIS — E1165 Type 2 diabetes mellitus with hyperglycemia: Secondary | ICD-10-CM | POA: Diagnosis not present

## 2018-10-11 DIAGNOSIS — Z8673 Personal history of transient ischemic attack (TIA), and cerebral infarction without residual deficits: Secondary | ICD-10-CM

## 2018-10-11 DIAGNOSIS — Z79899 Other long term (current) drug therapy: Secondary | ICD-10-CM | POA: Insufficient documentation

## 2018-10-11 LAB — GLUCOSE, POCT (MANUAL RESULT ENTRY): POC Glucose: 129 mg/dl — AB (ref 70–99)

## 2018-10-11 NOTE — Patient Instructions (Signed)
Keep up the good works with healthy eating and regular exercise.  I have referred you to orthopedics for your right shoulder.  Please go to the radiology department at [facility] to get the x-rays done.

## 2018-10-11 NOTE — Progress Notes (Signed)
Patient ID: Kelly Lang, female    DOB: February 21, 1961  MRN: [id number]  CC: Diabetes and Hypertension   Subjective: Kelly Lang is a 57 y.o. female who presents for 12-monthfollow-up on diabetes Her concerns today include:  Pt with hx of a.flutters/p ablation7/2018on Xarelto, DM type 2 ,gastroparesis,obesity, HL, HTN, chronic systolic CHF with EF 684-66% CVA.  DM: Since last visit with me, she has seen our clinical pharmacist twice.  She was prescribed new meter as her last one was giving inaccurate readings.   Checking BID - BF 120-140, before dinner 90s.   She is up to 70 units of Lantus daily.  She is compliant with oral hypoglycemics.  She denies any hypoglycemic episodes Walking during her lunch hr and walks her dog daily.  Eating habits:  Trying to eat dinner earlier rather than 9 at nights  Prescribed Cymbalta on last visit for polyarthralgia and chronic pain.  She reports that she is doing well on the Cymbalta and also it helped with her mood.  Only pain that still remains, his pain in the right shoulder x4 months.  She denies any trauma or initiating factors.  The pain is worse with movements.  No pain when she lays on her Rt side.   Patient Active Problem List   Diagnosis Date Noted  . Chronic pain disorder 08/09/2018  . Diabetic polyneuropathy associated with type 2 diabetes mellitus ([facility] 08/09/2018  . Gastroparesis 12/21/2017  . GI bleed 12/06/2017  . Hepatic steatosis 12/04/2017  . Chronic systolic CHF (congestive heart failure) ([facility] 09/16/2015  . Cerebrovascular accident (CVA) due to embolism of left middle cerebral artery ([facility] [place]   . Thyroid mass 09/15/2015  . Low back pain 04/03/2014  . Unspecified vitamin D deficiency 01/20/2014  . Essential hypertension, benign 01/20/2014  . Chronic anticoagulation 10/31/2013  . Tachycardia induced cardiomyopathy ([facility] [place]   . H/O: hypothyroidism   . Obesity   . IDDM (insulin dependent diabetes mellitus) ([facility]   .  Arthritis   . Dyslipidemia   . Atrial fibrillation  10/25/2013     Current Outpatient Medications on File Prior to Visit  Medication Sig Dispense Refill  . atorvastatin (LIPITOR) 40 MG tablet Take 1 tablet (40 mg total) by mouth daily. 30 tablet 2  . Blood Glucose Monitoring Suppl ([place] w/Device KIT Use as instructed to check blood sugar up to 3 times daily. 1 kit 0  . diclofenac sodium (VOLTAREN) 1 % GEL Apply 2 g topically 4 (four) times daily as needed (pain). 100 g 4  . DULoxetine (CYMBALTA) 20 MG capsule Take 1 capsule (20 mg total) by mouth daily. 30 capsule 3  . glipiZIDE (GLUCOTROL) 10 MG tablet Take 1 tablet (10 mg total) by mouth 2 (two) times daily before a meal. 60 tablet 2  . glucose blood (ONETOUCH VERIO) test strip Use as instructed to check blood sugar up to 3 times daily. 100 each 11  . Insulin Glargine (LANTUS SOLOSTAR) 100 UNIT/ML Solostar Pen Inject 70 Units into the skin daily. 15 mL 2  . Insulin Pen Needle (ULTICARE MICRO PEN NEEDLES) 32G X 4 MM MISC 1 applicator by Does not apply route at bedtime. 100 each 2  . JARDIANCE 10 MG TABS tablet TAKE 1 TABLET BY MOUTH DAILY. 30 tablet 2  . lisinopril (PRINIVIL,ZESTRIL) 10 MG tablet Take 0.5 tablets (5 mg total) by mouth daily. 45 tablet 3  . metFORMIN (GLUCOPHAGE) 1000 MG tablet Take 1 tablet (1,000 mg total) by  mouth 2 (two) times daily with a meal. 180 tablet 3  . metoCLOPramide (REGLAN) 5 MG tablet Take 1 tab by mouth  30 min before meals as needed. 90 tablet 5  . metoprolol tartrate (LOPRESSOR) 25 MG tablet TAKE 1 & 1/2 TABLET BY MOUTH 2 TIMES DAILY 270 tablet 2  . omeprazole (PRILOSEC) 40 MG capsule Take 1 capsule (40 mg total) by mouth daily.    Glory Rosebush DELICA LANCETS 99B MISC Use as instructed to check blood sugar up to 3 times daily. 100 each 11  . triamcinolone cream (KENALOG) 0.1 % Apply 1 application topically 2 (two) times daily as needed (rash). 45 g 1  . XARELTO 20 MG TABS tablet TAKE 1  TABLET BY MOUTH DAILY WITH SUPPER. 30 tablet 5   No current facility-administered medications on file prior to visit.     Allergies  Allergen Reactions  . Codeine Itching and Rash    Social History   Socioeconomic History  . Marital status: Divorced    Spouse name: Not on file  . Number of children: 0  . Years of education: 63  . Highest education level: Not on file  Occupational History  . Occupation: Chief Technology Officer  Social Needs  . Financial resource strain: Not on file  . Food insecurity:    Worry: Not on file    Inability: Not on file  . Transportation needs:    Medical: Not on file    Non-medical: Not on file  Tobacco Use  . Smoking status: Former Smoker    Packs/day: 1.00    Years: 22.00    Pack years: 22.00    Types: Cigarettes    Last attempt to quit: 12/01/2010    Years since quitting: 7.8  . Smokeless tobacco: Never Used  Substance and Sexual Activity  . Alcohol use: Yes    Comment: 06/30/2017 "glass of wine maybe q other month"  . Drug use: No  . Sexual activity: Not Currently  Lifestyle  . Physical activity:    Days per week: Not on file    Minutes per session: Not on file  . Stress: Not on file  Relationships  . Social connections:    Talks on phone: Not on file    Gets together: Not on file    Attends religious service: Not on file    Active member of club or organization: Not on file    Attends meetings of clubs or organizations: Not on file    Relationship status: Not on file  . Intimate partner violence:    Fear of current or ex partner: Not on file    Emotionally abused: Not on file    Physically abused: Not on file    Forced sexual activity: Not on file  Other Topics Concern  . Not on file  Social History Narrative   Moved to [place] from [place] in 2002.     Fun: shop, sew, decorate   Denies any beliefs effecting healthcare    Family History  Adopted: Yes  Problem Relation Age of Onset  . Hypertension Mother   .  Hyperlipidemia Mother     Past Surgical History:  Procedure Laterality Date  . ATRIAL FIBRILLATION ABLATION  06/30/2017  . ATRIAL FIBRILLATION ABLATION N/A 06/30/2017   Procedure: Atrial Fibrillation Ablation;  Surgeon: Thompson Grayer, MD;  Location: [place] [facility] CV LAB;  Service: Cardiovascular;  Laterality: N/A;  . BIOPSY THYROID  1996  . CARDIOVERSION N/A 12/07/2013   Procedure:  CARDIOVERSION;  Surgeon: Casandra Doffing, MD;  [facility] END[facility];  Service: Cardiovascular;  Laterality: N/A;  . CARDIOVERSION N/A 03/10/2016   Procedure: CARDIOVERSION;  Surgeon: Thayer Headings, MD;  Location: [place] ENDOSCOPY;  Service: Cardiovascular;  Laterality: N/A;  . ESOPHAGOGASTRODUODENOSCOPY N/A 12/07/2017   Procedure: ESOPHAGOGASTRODUODENOSCOPY (EGD);  Surgeon: Irene Shipper, MD;  Location: [facility] ENDOSCOPY;  Service: Endoscopy;  Laterality: N/A;  . EYE MUSCLE SURGERY Right ~ 1966  . TEE WITHOUT CARDIOVERSION N/A 06/29/2017   Procedure: TRANSESOPHAGEAL ECHOCARDIOGRAM (TEE);  Surgeon: Fay Records, MD;  Location: [place];  Service: Cardiovascular;  Laterality: N/A;  . TONSILLECTOMY  1971    ROS: Review of Systems Negative except as above  PHYSICAL EXAM: BP 130/82   Pulse 61   Temp 98.5 F (36.9 C) (Oral)   Resp 16   Wt 248 lb (112.5 kg)   LMP 04/15/2015 (LMP Unknown) Comment: last in may  SpO2 95%   BMI 40.64 kg/m   BP 130/82 Physical Exam  General appearance - alert, well appearing, and in no distress Mental status - normal mood, behavior, speech, dress, motor activity, and thought processes Neck - supple, no significant adenopathy Chest - clear to auscultation, no wheezes, rales or rhonchi, symmetric air entry Heart - normal rate, regular rhythm, normal S1, S2, no murmurs, rubs, clicks or gallops Musculoskeletal -right shoulder: No point tenderness on palpation of the joint.  Moderate discomfort with cross body movement and attempted passive elevation.  Drop arm test positive.  Internal  rotation lag sign positive Results for orders placed or performed in visit on 10/11/18  POCT glucose (manual entry)  Result Value Ref Range   POC Glucose 129 (A) 70 - 99 mg/dl    ASSESSMENT AND PLAN: 1. Controlled type 2 diabetes mellitus with complication, with long-term current use of insulin (HCC) Blood sugars are much better.  She will continue current dose of Lantus and her oral medications.  Encourage her to continue healthy eating habits and regular exercise. - POCT glucose (manual entry)  2. Essential hypertension Repeat blood pressure close to goal.  Continue metoprolol and lisinopril.  3. Polyarthralgia Much improved on Cymbalta.  4. Chronic right shoulder pain Exam suggest rotator cuff pathology. - DG Shoulder Right; Future - Ambulatory referral to Orthopedic Surgery     Patient was given the opportunity to ask questions.  Patient verbalized understanding of the plan and was able to repeat key elements of the plan.   Orders Placed This Encounter  Procedures  . DG Shoulder Right  . Ambulatory referral to Orthopedic Surgery  . POCT glucose (manual entry)     Requested Prescriptions    No prescriptions requested or ordered in this encounter    Return in about 3 months (around 01/11/2019).  Karle Plumber, MD, FACP

## 2018-10-11 NOTE — Progress Notes (Signed)
Pt states she is taking the skyrizi injection

## 2018-10-12 MED FILL — LANTUS SOLOSTAR 100 UNITS/M: 100 | 21 days supply | Qty: 15 | Fill #2

## 2018-10-20 ENCOUNTER — Encounter (INDEPENDENT_AMBULATORY_CARE_PROVIDER_SITE_OTHER): Payer: Self-pay | Admitting: Orthopedic Surgery

## 2018-10-20 ENCOUNTER — Ambulatory Visit (INDEPENDENT_AMBULATORY_CARE_PROVIDER_SITE_OTHER): Payer: BLUE CROSS/BLUE SHIELD | Admitting: Orthopedic Surgery

## 2018-10-20 DIAGNOSIS — M19011 Primary osteoarthritis, right shoulder: Secondary | ICD-10-CM

## 2018-10-20 DIAGNOSIS — M7541 Impingement syndrome of right shoulder: Secondary | ICD-10-CM

## 2018-10-20 MED ORDER — BUPIVACAINE HCL 0.25 % IJ SOLN
0.6600 mL | INTRAMUSCULAR | Status: AC | PRN
  Administered 2018-10-20: .66 mL via INTRA_ARTICULAR

## 2018-10-20 MED ORDER — METHYLPREDNISOLONE ACETATE 40 MG/ML IJ SUSP
40.0000 mg | INTRAMUSCULAR | Status: AC | PRN
  Administered 2018-10-20: 40 mg via INTRA_ARTICULAR

## 2018-10-20 MED ORDER — LIDOCAINE HCL 1 % IJ SOLN
3.0000 mL | INTRAMUSCULAR | Status: AC | PRN
  Administered 2018-10-20: 3 mL

## 2018-10-20 MED ORDER — METHYLPREDNISOLONE ACETATE 40 MG/ML IJ SUSP
13.3300 mg | INTRAMUSCULAR | Status: AC | PRN
  Administered 2018-10-20: 13.33 mg via INTRA_ARTICULAR

## 2018-10-20 MED ORDER — LIDOCAINE HCL 1 % IJ SOLN
5.0000 mL | INTRAMUSCULAR | Status: AC | PRN
  Administered 2018-10-20: 5 mL

## 2018-10-20 MED ORDER — BUPIVACAINE HCL 0.5 % IJ SOLN
9.0000 mL | INTRAMUSCULAR | Status: AC | PRN
  Administered 2018-10-20: 9 mL via INTRA_ARTICULAR

## 2018-10-20 NOTE — Progress Notes (Signed)
Office Visit Note   Patient: Kelly Lang           Date of Birth: January 19, 1961           MRN: [id number] Visit Date: 10/20/2018 Requested by: Marcine Matar, MD [address] [place], [place] [address] PCP: Marcine Matar, MD  Subjective: Chief Complaint  Patient presents with  . Right Shoulder - Pain    HPI: Kelly Lang is a patient with right shoulder pain.  Has been bothering her for several months but had acute pain about 2 weeks ago.  She was actually unable to lift her arm on the day of the acute pain.  She is right-hand dominant.  She is unable to lay on the right side.  Radiographs on the 10 system are reviewed and it shows small amount of calcific tendinitis at the supraspinatus insertion as well as some AC joint arthritis.  She takes Cymbalta for this problem.  The pain did wake her from sleep but it also hurts her to roll onto the right side.  She cannot take anti-inflammatories and Tylenol has not been helpful.  She just reports pain but denies any history of injury.  She denies any neck pain.  She works as a Designer, television/film set.              ROS: All systems reviewed are negative as they relate to the chief complaint within the history of present illness.  Patient denies  fevers or chills.   Assessment & Plan: Visit Diagnoses:  1. Arthritis of right acromioclavicular joint   2. Impingement syndrome of right shoulder     Plan: Impression his right shoulder pain with no evidence of frozen shoulder.  Rotator cuff strength is intact.  The acute nature of the pain favors calcific tendinitis however her AC joint tenderness on exam and degenerative changes on plain radiographs favors AC joint arthritis.  I will inject both these areas today and see her back in a month to decide whether or not we need further imaging.  Follow-Up Instructions: Return in about 4 weeks (around 11/17/2018).   Orders:  No orders of the defined types were placed in this encounter.  No orders of the  defined types were placed in this encounter.     Procedures: Medium Joint Inj: R acromioclavicular on 10/20/2018 9:49 PM Indications: pain and diagnostic evaluation Details: 27 G 1.5 in needle, ultrasound-guided superior approach Medications: 13.33 mg methylPREDNISolone acetate 40 MG/ML; 0.66 mL bupivacaine 0.25 %; 3 mL lidocaine 1 % Outcome: tolerated well, no immediate complications Procedure, treatment alternatives, risks and benefits explained, specific risks discussed. Consent was given by the patient. Immediately prior to procedure a time out was called to verify the correct patient, procedure, equipment, support staff and site/side marked as required. Patient was prepped and draped in the usual sterile fashion.   Large Joint Inj: R subacromial bursa on 10/20/2018 9:49 PM Indications: diagnostic evaluation and pain Details: 18 G 1.5 in needle, posterior approach  Arthrogram: No  Medications: 9 mL bupivacaine 0.5 %; 40 mg methylPREDNISolone acetate 40 MG/ML; 5 mL lidocaine 1 % Outcome: tolerated well, no immediate complications Procedure, treatment alternatives, risks and benefits explained, specific risks discussed. Consent was given by the patient. Immediately prior to procedure a time out was called to verify the correct patient, procedure, equipment, support staff and site/side marked as required. Patient was prepped and draped in the usual sterile fashion.       Clinical Data: No additional  findings.  Objective: Vital Signs: LMP 04/15/2015 (LMP Unknown) Comment: last in may  Physical Exam:   Constitutional: Patient appears well-developed HEENT:  Head: Normocephalic Eyes:EOM are normal Neck: Normal range of motion Cardiovascular: Normal rate Pulmonary/chest: Effort normal Neurologic: Patient is alert Skin: Skin is warm Psychiatric: Patient has normal mood and affect    Ortho Exam: Ortho exam demonstrates full active and passive range of motion of the cervical  spine.  Patient has 5 out of 5 grip EPL FPL interosseous wrist flexion extension bicep triceps and deltoid strength.  Patient has symmetric reflexes and good radial pulses.  Patient does have AC joint tenderness on the right and pain with crossarm adduction which is not present on the left.  Good rotator cuff strength isolated infraspinatus supinates and subscap muscle testing.  Impingement signs are positive on the right negative on the left.  Specialty Comments:  No specialty comments available.  Imaging: No results found.   PMFS History: Patient Active Problem List   Diagnosis Date Noted  . Chronic pain disorder 08/09/2018  . Diabetic polyneuropathy associated with type 2 diabetes mellitus (HCC) 08/09/2018  . Gastroparesis 12/21/2017  . GI bleed 12/06/2017  . Hepatic steatosis 12/04/2017  . Chronic systolic CHF (congestive heart failure) (HCC) 09/16/2015  . 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 Thyroid mass 09/15/2015  . Low back pain 04/03/2014  . Unspecified vitamin D deficiency 01/20/2014  . Essential hypertension, benign 01/20/2014  . Chronic anticoagulation 10/31/2013  . Tachycardia induced cardiomyopathy (HCC)   . H/O: hypothyroidism   . Obesity   . IDDM (insulin dependent diabetes mellitus) (HCC)   . Arthritis   . Dyslipidemia   . Atrial fibrillation  10/25/2013   Past Medical History:  Diagnosis Date  . Arthritis    "all over" (06/30/2017)  . Chicken pox   . Dyslipidemia   . H/O: hypothyroidism    a. thyroid biopsy 1996 with synthroid. Stopped taking rx and was loss to follow up b. normal thyroid function 10/20/13  . High cholesterol   . Hx of cardiovascular stress test    ETT/Lexiscan Myoview (12/2013):  No ischemia; not gated; low risk.  Marland Kitchen Hypertension   . Obesity   . Persistent atrial fibrillation    a diagnosed 10/25/2013  . Seasonal allergies   . Stroke [facility]) 2016   "some speech problems since"  (06/30/2017)  . 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 2/2 Afib with RVR for unknown duration (EF: 40-45%)  . Thyroid disease   . Type II diabetes mellitus (HCC)    a. hg A1c 10, newly diagnosed (10/26/13)    Family History  Adopted: Yes  Problem Relation Age of Onset  . Hypertension Mother   . Hyperlipidemia Mother     Past Surgical History:  Procedure Laterality Date  . ATRIAL FIBRILLATION ABLATION  06/30/2017  . ATRIAL FIBRILLATION ABLATION N/A 06/30/2017   Procedure: Atrial Fibrillation Ablation;  Surgeon: Hillis Range, MD;  Location: [facility] INVASIVE CV LAB;  Service: Cardiovascular;  Laterality: N/A;  . BIOPSY THYROID  1996  . CARDIOVERSION N/A 12/07/2013   Procedure: CARDIOVERSION;  Surgeon: Everette Rank, MD;  Location: [facility] ENDOSCOPY;  Service: Cardiovascular;  Laterality: N/A;  . CARDIOVERSION N/A 03/10/2016   Procedure: CARDIOVERSION;  Surgeon: Vesta Mixer, MD;  Location: [facility] ENDOSCOPY;  Service: Cardiovascular;  Laterality: N/A;  . ESOPHAGOGASTRODUODENOSCOPY N/A 12/07/2017   Procedure: ESOPHAGOGASTRODUODENOSCOPY (EGD);  Surgeon: Hilarie Fredrickson, MD;  [facility] ENDOS[facility];  Service: Endoscopy;  Laterality: N/A;  . EYE MUSCLE SURGERY Right ~ 1966  . TEE WITHOUT CARDIOVERSION N/A 06/29/2017   Procedure: TRANSESOPHAGEAL ECHOCARDIOGRAM (TEE);  Surgeon: Pricilla Riffle, MD;  Location: [facility] ENDOSCOPY;  Service: Cardiovascular;  Laterality: N/A;  . TONSILLECTOMY  1971   Social History   Occupational History  . Occupation: Designer, television/film set  Tobacco Use  . Smoking status: Former Smoker    Packs/day: 1.00    Years: 22.00    Pack years: 22.00    Types: Cigarettes    Last attempt to quit: 12/01/2010    Years since quitting: 7.8  . Smokeless tobacco: Never Used  Substance and Sexual Activity  . Alcohol use: Yes    Comment: 06/30/2017 "glass of wine maybe q other month"  . Drug use: No  . Sexual activity: Not Currently

## 2018-10-26 MED FILL — JARDIANCE 10 MG TABLET: 10 | 30 days supply | Qty: 30 | Fill #4

## 2018-10-26 MED FILL — DICLOFENAC SODIUM 1% GEL: 1 | 12 days supply | Qty: 100 | Fill #3

## 2018-10-26 MED FILL — METOCLOPRAMIDE 5 MG TABLET: 5 | 30 days supply | Qty: 30 | Fill #3

## 2018-10-26 MED FILL — metFORMIN HCL 1000 MG TABS: 1000 | 90 days supply | Qty: 180 | Fill #7

## 2018-10-26 MED FILL — OMEPRAZOLE 20 MG CAP: 20 | 30 days supply | Qty: 30 | Fill #3

## 2018-10-26 MED FILL — METOPROLOL TARTRATE 25 MG T: 25 | 90 days supply | Qty: 270 | Fill #1

## 2018-11-01 MED FILL — glipiZIDE 10 MG TABS: 10 | 30 days supply | Qty: 60 | Fill #2

## 2018-11-01 MED FILL — ATORVASTATIN CALCIUM 40 MG: 40 | 30 days supply | Qty: 30 | Fill #1

## 2018-11-01 MED FILL — DULoxetine HCL 20 MG CPEP: 20 | 30 days supply | Qty: 30 | Fill #3

## 2018-11-01 MED FILL — XARELTO 20 MG TABLET: 20 | 30 days supply | Qty: 30 | Fill #2

## 2018-11-05 ENCOUNTER — Other Ambulatory Visit: Payer: Self-pay | Admitting: Internal Medicine

## 2018-11-05 MED FILL — LANTUS SOLOSTAR 100 UNITS/M: 100 | 21 days supply | Qty: 15 | Fill #0

## 2018-11-10 MED FILL — ATORVASTATIN CALCIUM 40 MG: 40 | 30 days supply | Qty: 30 | Fill #1

## 2018-11-15 DIAGNOSIS — Z79899 Other long term (current) drug therapy: Secondary | ICD-10-CM | POA: Diagnosis not present

## 2018-11-15 DIAGNOSIS — L400 Psoriasis vulgaris: Secondary | ICD-10-CM | POA: Diagnosis not present

## 2018-11-17 ENCOUNTER — Ambulatory Visit (INDEPENDENT_AMBULATORY_CARE_PROVIDER_SITE_OTHER): Payer: BLUE CROSS/BLUE SHIELD | Admitting: Orthopedic Surgery

## 2018-11-17 ENCOUNTER — Encounter (INDEPENDENT_AMBULATORY_CARE_PROVIDER_SITE_OTHER): Payer: Self-pay | Admitting: Orthopedic Surgery

## 2018-11-17 DIAGNOSIS — M19011 Primary osteoarthritis, right shoulder: Secondary | ICD-10-CM

## 2018-11-21 ENCOUNTER — Encounter (INDEPENDENT_AMBULATORY_CARE_PROVIDER_SITE_OTHER): Payer: Self-pay | Admitting: Orthopedic Surgery

## 2018-11-21 NOTE — Progress Notes (Signed)
Office Visit Note   Patient: Kelly Lang           Date of Birth: 1961/01/31           MRN: [id number] Visit Date: 11/17/2018 Requested by: Marcine Matar, MD [address] [place], [place] [address] PCP: Marcine Matar, MD  Subjective: Chief Complaint  Patient presents with  . Right Shoulder - Follow-up    HPI: Kelly Lang is a 57 year old patient who is here for 1 month review of AC joint arthritis and calcific tinnitus.  She had subacromial injection and AC joint injection a month ago.  She is feeling better.  Denies any night pain.  Still hurts a little bit to lay on that right-hand side.  She is okay at work.  Reports no AC joint tenderness just pain in the deltoid region.  No medications taken currently.  Decision point today was for or against MRI scanning.              ROS: All systems reviewed are negative as they relate to the chief complaint within the history of present illness.  Patient denies  fevers or chills.   Assessment & Plan: Visit Diagnoses:  1. Arthritis of right acromioclavicular joint     Plan: Impression is right shoulder AC joint arthritis and calcific tendinitis both responding well to injection.  No need for further imaging.  I would favor another round of injections if her symptoms recur.  No evidence of frozen shoulder or rotator cuff weakness at this time.  Follow-up with me as needed.  Follow-Up Instructions: Return if symptoms worsen or fail to improve.   Orders:  No orders of the defined types were placed in this encounter.  No orders of the defined types were placed in this encounter.     Procedures: No procedures performed   Clinical Data: No additional findings.  Objective: Vital Signs: LMP 04/15/2015 (LMP Unknown) Comment: last in may  Physical Exam:   Constitutional: Patient appears well-developed HEENT:  Head: Normocephalic Eyes:EOM are normal Neck: Normal range of motion Cardiovascular: Normal rate Pulmonary/chest:  Effort normal Neurologic: Patient is alert Skin: Skin is warm Psychiatric: Patient has normal mood and affect    Ortho Exam: Ortho exam demonstrates full active and passive range of motion of the shoulder and neck with intact motor or sensory function to the hand.  No other masses lymphadenopathy or skin changes noted in the shoulder girdle region.  Significantly less AC joint tenderness on the right today compared to a month ago.  Passive range of motion maintained.  Specialty Comments:  No specialty comments available.  Imaging: No results found.   PMFS History: Patient Active Problem List   Diagnosis Date Noted  . Chronic pain disorder 08/09/2018  . Diabetic polyneuropathy associated with type 2 diabetes mellitus (HCC) 08/09/2018  . Gastroparesis 12/21/2017  . GI bleed 12/06/2017  . Hepatic steatosis 12/04/2017  . Chronic systolic CHF (congestive heart failure) (HCC) 09/16/2015  . 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 Thyroid mass 09/15/2015  . Low back pain 04/03/2014  . Unspecified vitamin D deficiency 01/20/2014  . Essential hypertension, benign 01/20/2014  . Chronic anticoagulation 10/31/2013  . Tachycardia induced cardiomyopathy (HCC)   . H/O: hypothyroidism   . Obesity   . IDDM (insulin dependent diabetes mellitus) (HCC)   . Arthritis   . Dyslipidemia   . Atrial fibrillation  10/25/2013   Past Medical History:  Diagnosis  Date  . Arthritis    "all over" (06/30/2017)  . Chicken pox   . Dyslipidemia   . H/O: hypothyroidism    a. thyroid biopsy 1996 with synthroid. Stopped taking rx and was loss to follow up b. normal thyroid function 10/20/13  . High cholesterol   . Hx of cardiovascular stress test    ETT/Lexiscan Myoview (12/2013):  No ischemia; not gated; low risk.  Marland Kitchen. Hypertension   . Obesity   . Persistent atrial fibrillation    a diagnosed 10/25/2013  . Seasonal allergies   . Stroke [facility](HCC) 2016   "some  speech problems since" (06/30/2017)  . 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 2/2 Afib with RVR for unknown duration (EF: 40-45%)  . Thyroid disease   . Type II diabetes mellitus (HCC)    a. hg A1c 10, newly diagnosed (10/26/13)    Family History  Adopted: Yes  Problem Relation Age of Onset  . Hypertension Mother   . Hyperlipidemia Mother     Past Surgical History:  Procedure Laterality Date  . ATRIAL FIBRILLATION ABLATION  06/30/2017  . ATRIAL FIBRILLATION ABLATION N/A 06/30/2017   Procedure: Atrial Fibrillation Ablation;  Surgeon: Hillis RangeAllred, James, MD;  [facility];  Service: Cardiovascular;  Laterality: N/A;  . BIOPSY THYROID  1996  . CARDIOVERSION N/A 12/07/2013   Procedure: CARDIOVERSION;  Surgeon: Everette RankJay Varanasi, MD;  [facility];  Service: Cardiovascular;  Laterality: N/A;  . CARDIOVERSION N/A 03/10/2016   Procedure: CARDIOVERSION;  Surgeon: Vesta MixerPhilip J Nahser, MD;  [facility];  Service: Cardiovascular;  Laterality: N/A;  . ESOPHAGOGASTRODUODENOSCOPY N/A 12/07/2017   Procedure: ESOPHAGOGASTRODUODENOSCOPY (EGD);  Surgeon: Hilarie FredricksonPerry, John N, MD;  [facility];  Service: Endoscopy;  Laterality: N/A;  . EYE MUSCLE SURGERY Right ~ 1966  . TEE WITHOUT CARDIOVERSION N/A 06/29/2017   Procedure: TRANSESOPHAGEAL ECHOCARDIOGRAM (TEE);  Surgeon: Pricilla Riffleoss, Paula V, MD;  [facility];  Service: Cardiovascular;  Laterality: N/A;  . TONSILLECTOMY  1971   Social History   Occupational History  . Occupation: Designer, television/film setcatering manager  Tobacco Use  . Smoking status: Former Smoker    Packs/day: 1.00    Years: 22.00    Pack years: 22.00    Types: Cigarettes    Last attempt to quit: 12/01/2010    Years since quitting: 7.9  . Smokeless tobacco: Never Used  Substance and Sexual Activity  . Alcohol use: Yes    Comment: 06/30/2017 "glass of wine maybe q other month"  . Drug use: No  . Sexual activity: Not Currently

## 2018-11-25 MED FILL — JARDIANCE 10 MG TABLET: 10 | 30 days supply | Qty: 30 | Fill #5

## 2018-11-25 MED FILL — DICLOFENAC SODIUM 1% GEL: 1 | 12 days supply | Qty: 100 | Fill #4

## 2018-12-08 ENCOUNTER — Other Ambulatory Visit: Payer: Self-pay | Admitting: Internal Medicine

## 2018-12-08 DIAGNOSIS — G894 Chronic pain syndrome: Secondary | ICD-10-CM

## 2018-12-08 DIAGNOSIS — M2550 Pain in unspecified joint: Secondary | ICD-10-CM

## 2018-12-08 MED FILL — LANTUS SOLOSTAR 100 UNITS/M: 100 | 21 days supply | Qty: 15 | Fill #1

## 2018-12-08 MED FILL — ATORVASTATIN CALCIUM 40 MG: 40 | 30 days supply | Qty: 30 | Fill #2

## 2018-12-09 MED FILL — DULoxetine HCL 20 MG CPEP: 20 | 30 days supply | Qty: 30 | Fill #0

## 2018-12-13 MED FILL — XARELTO 20 MG TABLET: 20 | 30 days supply | Qty: 30 | Fill #3

## 2018-12-17 DIAGNOSIS — L400 Psoriasis vulgaris: Secondary | ICD-10-CM | POA: Diagnosis not present

## 2018-12-20 MED FILL — LISINOPRIL 10 MG TABS: 10 | 30 days supply | Qty: 15 | Fill #9

## 2018-12-29 ENCOUNTER — Other Ambulatory Visit: Payer: Self-pay | Admitting: Internal Medicine

## 2018-12-29 DIAGNOSIS — IMO0002 Reserved for concepts with insufficient information to code with codable children: Secondary | ICD-10-CM

## 2018-12-29 DIAGNOSIS — E1165 Type 2 diabetes mellitus with hyperglycemia: Secondary | ICD-10-CM

## 2018-12-29 DIAGNOSIS — E118 Type 2 diabetes mellitus with unspecified complications: Principal | ICD-10-CM

## 2018-12-29 MED FILL — LANTUS SOLOSTAR 100 UNITS/M: 100 | 21 days supply | Qty: 15 | Fill #2

## 2018-12-30 MED FILL — JARDIANCE 10 MG TABLET: 10 | 30 days supply | Qty: 30 | Fill #0

## 2018-12-30 MED FILL — glipiZIDE 10 MG TABS: 10 | 30 days supply | Qty: 60 | Fill #0

## 2019-01-05 ENCOUNTER — Other Ambulatory Visit: Payer: Self-pay | Admitting: Internal Medicine

## 2019-01-05 MED FILL — ATORVASTATIN CALCIUM 40 MG: 40 | 30 days supply | Qty: 30 | Fill #0

## 2019-01-05 MED FILL — DULoxetine HCL 20 MG CPEP: 20 | 30 days supply | Qty: 30 | Fill #1

## 2019-01-07 MED FILL — XARELTO 20 MG TABLET: 20 | 30 days supply | Qty: 30 | Fill #4

## 2019-01-10 ENCOUNTER — Ambulatory Visit: Payer: BLUE CROSS/BLUE SHIELD | Admitting: Internal Medicine

## 2019-01-20 ENCOUNTER — Other Ambulatory Visit: Payer: Self-pay | Admitting: Internal Medicine

## 2019-01-20 DIAGNOSIS — I10 Essential (primary) hypertension: Secondary | ICD-10-CM

## 2019-01-20 MED FILL — LANTUS SOLOSTAR 100 UNITS/M: 100 | 20 days supply | Qty: 15 | Fill #0

## 2019-01-21 MED FILL — LISINOPRIL 10 MG TABS: 10 | 30 days supply | Qty: 15 | Fill #0

## 2019-01-25 ENCOUNTER — Ambulatory Visit: Payer: BLUE CROSS/BLUE SHIELD | Attending: Internal Medicine | Admitting: Internal Medicine

## 2019-01-25 ENCOUNTER — Encounter: Payer: Self-pay | Admitting: Internal Medicine

## 2019-01-25 VITALS — BP 120/80 | HR 83 | Temp 98.5°F | Resp 16 | Wt 247.8 lb

## 2019-01-25 DIAGNOSIS — E785 Hyperlipidemia, unspecified: Secondary | ICD-10-CM | POA: Diagnosis not present

## 2019-01-25 DIAGNOSIS — E118 Type 2 diabetes mellitus with unspecified complications: Secondary | ICD-10-CM

## 2019-01-25 DIAGNOSIS — Z794 Long term (current) use of insulin: Secondary | ICD-10-CM

## 2019-01-25 DIAGNOSIS — Z6841 Body Mass Index (BMI) 40.0 and over, adult: Secondary | ICD-10-CM

## 2019-01-25 DIAGNOSIS — I4891 Unspecified atrial fibrillation: Secondary | ICD-10-CM

## 2019-01-25 DIAGNOSIS — I10 Essential (primary) hypertension: Secondary | ICD-10-CM

## 2019-01-25 DIAGNOSIS — IMO0002 Reserved for concepts with insufficient information to code with codable children: Secondary | ICD-10-CM

## 2019-01-25 DIAGNOSIS — E1165 Type 2 diabetes mellitus with hyperglycemia: Secondary | ICD-10-CM

## 2019-01-25 LAB — POCT GLYCOSYLATED HEMOGLOBIN (HGB A1C): HbA1c, POC (controlled diabetic range): 8.1 % — AB (ref 0.0–7.0)

## 2019-01-25 LAB — GLUCOSE, POCT (MANUAL RESULT ENTRY): POC Glucose: 161 mg/dl — AB (ref 70–99)

## 2019-01-25 MED ORDER — INSULIN PEN NEEDLE 32G X 4 MM MISC: 1.0000 | Freq: Every day | 2 refills | Status: DC

## 2019-01-25 MED ORDER — METFORMIN HCL 1000 MG PO TABS: 1000.0000 mg | ORAL_TABLET | Freq: Two times a day (BID) | ORAL | 3 refills | Status: DC

## 2019-01-25 MED ORDER — GLIPIZIDE 10 MG PO TABS: 10.0000 mg | ORAL_TABLET | Freq: Two times a day (BID) | ORAL | 12 refills | Status: DC

## 2019-01-25 NOTE — Progress Notes (Signed)
Patient ID: Kelly Lang, female    DOB: 10-09-1961  MRN: [id number]  CC: Diabetes and Hypertension   Subjective: Kelly Lang is a 58 y.o. female who presents for chronic disease management Her concerns today include:  Pt with hx of a.flutters/p ablation7/2018on Xarelto, DM type 2 ,gastroparesis,obesity, HL, HTN, chronic systolic CHF with EF 89-38%, CVA.  Polyarthralgia: She reports that she is doing well on Cymbalta.  It has helped a lot with decreasing musculoskeletal pain and and stabilizing her mood.  However she reports that she is under some stress at home.  She lives with her mother to help take care of her.  Her brother who is a functional alcoholic also lives with them.  Diabetes/obesity: A1c today is 8.1.  It was 7.7 on previous visit.  Patient brings glucometer with her.  She checks blood sugars once a day in the mornings.  Looks like these blood sugars were doing good until about a week and a half ago when morning blood sugars started running between 197 to the mid 200s.  She denies any changes in her eating habits.  Not getting in any formal exercise but she is busy cleaning the house on the weekends and sometimes uses her stationary bike but not as often as she would like.  She states that by the time she finishes work and then coming home to [employer] she is exhausted. Reports compliance with medications.  HTN: No device to check blood pressure.  Reports compliance with medications.  She limits salt in the foods.  No chest pains or shortness of breath.  Atrial fibrillation: She remains on anticoagulation.  No palpitations.  Patient Active Problem List   Diagnosis Date Noted  . Chronic pain disorder 08/09/2018  . Diabetic polyneuropathy associated with type 2 diabetes mellitus ([place]) 08/09/2018  . Gastroparesis 12/21/2017  . GI bleed 12/06/2017  . Hepatic steatosis 12/04/2017  . Chronic systolic CHF (congestive heart failure) ([place]) 09/16/2015  . Cerebrovascular  accident (CVA) due to embolism of left middle cerebral artery ([place])   . Thyroid mass 09/15/2015  . Low back pain 04/03/2014  . Unspecified vitamin D deficiency 01/20/2014  . Essential hypertension, benign 01/20/2014  . Chronic anticoagulation 10/31/2013  . Tachycardia induced cardiomyopathy ([place])   . H/O: hypothyroidism   . Obesity   . IDDM (insulin dependent diabetes mellitus) ([place])   . Arthritis   . Dyslipidemia   . Atrial fibrillation  10/25/2013     Current Outpatient Medications on File Prior to Visit  Medication Sig Dispense Refill  . atorvastatin (LIPITOR) 40 MG tablet TAKE 1 TABLET (40 MG TOTAL) BY MOUTH DAILY. 30 tablet 2  . Blood Glucose Monitoring Suppl ([place]) w/Device KIT Use as instructed to check blood sugar up to 3 times daily. 1 kit 0  . diclofenac sodium (VOLTAREN) 1 % GEL Apply 2 g topically 4 (four) times daily as needed (pain). 100 g 4  . DULoxetine (CYMBALTA) 20 MG capsule TAKE 1 CAPSULE (20 MG TOTAL) BY MOUTH DAILY. 30 capsule 2  . glipiZIDE (GLUCOTROL) 10 MG tablet TAKE 1 TABLET (10 MG TOTAL) BY MOUTH 2 (TWO) TIMES DAILY BEFORE A MEAL. 60 tablet 0  . glucose blood (ONETOUCH VERIO) test strip Use as instructed to check blood sugar up to 3 times daily. 100 each 11  . Insulin Pen Needle (ULTICARE MICRO PEN NEEDLES) 32G X 4 MM MISC 1 applicator by Does not apply route at bedtime. 100 each 2  . JARDIANCE  10 MG TABS tablet TAKE 1 TABLET BY MOUTH DAILY. 30 tablet 0  . LANTUS SOLOSTAR 100 UNIT/ML Solostar Pen INJECT 70 UNITS INTO THE SKIN DAILY. 15 mL 2  . lisinopril (PRINIVIL,ZESTRIL) 10 MG tablet TAKE 1/2 TABLET BY MOUTH DAILY. 15 tablet 2  . metFORMIN (GLUCOPHAGE) 1000 MG tablet Take 1 tablet (1,000 mg total) by mouth 2 (two) times daily with a meal. 180 tablet 3  . metoCLOPramide (REGLAN) 5 MG tablet Take 1 tab by mouth  30 min before meals as needed. 90 tablet 5  . metoprolol tartrate (LOPRESSOR) 25 MG tablet TAKE 1 & 1/2 TABLET BY MOUTH 2 TIMES  DAILY 270 tablet 2  . omeprazole (PRILOSEC) 40 MG capsule Take 1 capsule (40 mg total) by mouth daily.    Glory Rosebush DELICA LANCETS 94B MISC Use as instructed to check blood sugar up to 3 times daily. 100 each 11  . triamcinolone cream (KENALOG) 0.1 % Apply 1 application topically 2 (two) times daily as needed (rash). 45 g 1  . XARELTO 20 MG TABS tablet TAKE 1 TABLET BY MOUTH DAILY WITH SUPPER. 30 tablet 5   No current facility-administered medications on file prior to visit.     Allergies  Allergen Reactions  . Codeine Itching and Rash    Social History   Socioeconomic History  . Marital status: Divorced    Spouse name: Not on file  . Number of children: 0  . Years of education: 38  . Highest education level: Not on file  Occupational History  . Occupation: Chief Technology Officer  Social Needs  . Financial resource strain: Not on file  . Food insecurity:    Worry: Not on file    Inability: Not on file  . Transportation needs:    Medical: Not on file    Non-medical: Not on file  Tobacco Use  . Smoking status: Former Smoker    Packs/day: 1.00    Years: 22.00    Pack years: 22.00    Types: Cigarettes    Last attempt to quit: 12/01/2010    Years since quitting: 8.1  . Smokeless tobacco: Never Used  Substance and Sexual Activity  . Alcohol use: Yes    Comment: 06/30/2017 "glass of wine maybe q other month"  . Drug use: No  . Sexual activity: Not Currently  Lifestyle  . Physical activity:    Days per week: Not on file    Minutes per session: Not on file  . Stress: Not on file  Relationships  . Social connections:    Talks on phone: Not on file    Gets together: Not on file    Attends religious service: Not on file    Active member of club or organization: Not on file    Attends meetings of clubs or organizations: Not on file    Relationship status: Not on file  . Intimate partner violence:    Fear of current or ex partner: Not on file    Emotionally abused: Not on file      Physically abused: Not on file    Forced sexual activity: Not on file  Other Topics Concern  . Not on file  Social History Narrative   Moved to [place] from [place] in 2002.     Fun: shop, sew, decorate   Denies any beliefs effecting healthcare    Family History  Adopted: Yes  Problem Relation Age of Onset  . Hypertension Mother   . Hyperlipidemia Mother  Past Surgical History:  Procedure Laterality Date  . ATRIAL FIBRILLATION ABLATION  06/30/2017  . ATRIAL FIBRILLATION ABLATION N/A 06/30/2017   Procedure: Atrial Fibrillation Ablation;  Surgeon: Thompson Grayer, MD;  Location: [facility];  Service: Cardiovascular;  Laterality: N/A;  . BIOPSY THYROID  1996  . CARDIOVERSION N/A 12/07/2013   Procedure: CARDIOVERSION;  Surgeon: Casandra Doffing, MD;  Location: [facility];  Service: Cardiovascular;  Laterality: N/A;  . CARDIOVERSION N/A 03/10/2016   Procedure: CARDIOVERSION;  Surgeon: Thayer Headings, MD;  Location: [facility] ENDOSCOPY;  Service: Cardiovascular;  Laterality: N/A;  . ESOPHAGOGASTRODUODENOSCOPY N/A 12/07/2017   Procedure: ESOPHAGOGASTRODUODENOSCOPY (EGD);  Surgeon: Irene Shipper, MD;  Location: [facility] ENDOSCOPY;  Service: Endoscopy;  Laterality: N/A;  . EYE MUSCLE SURGERY Right ~ 1966  . TEE WITHOUT CARDIOVERSION N/A 06/29/2017   Procedure: TRANSESOPHAGEAL ECHOCARDIOGRAM (TEE);  Surgeon: Fay Records, MD;  Location: [facility] ENDOSCOPY;  Service: Cardiovascular;  Laterality: N/A;  . TONSILLECTOMY  1971    ROS: Review of Systems Negative except as stated above  PHYSICAL EXAM: BP 117/85   Pulse 83   Temp 98.5 F (36.9 C) (Oral)   Resp 16   Wt 247 lb 12.8 oz (112.4 kg)   LMP 04/15/2015 (LMP Unknown) Comment: last in may  SpO2 97%   BMI 40.61 kg/m   Physical Exam BP 120/80 General appearance - alert, well appearing, and in no distress Mental status - normal mood, behavior, speech, dress, motor activity, and thought processes Neck - supple, no significant  adenopathy Chest - clear to auscultation, no wheezes, rales or rhonchi, symmetric air entry Heart - normal rate, regular rhythm, normal S1, S2, no murmurs, rubs, clicks or gallops Extremities - peripheral pulses normal, no pedal edema, no clubbing or cyanosis   CMP Latest Ref Rng & Units 08/09/2018 12/09/2017 12/08/2017  Glucose 65 - 99 mg/dL 185(H) 174(H) 193(H)  BUN 6 - 24 mg/dL '16 10 8  ' Creatinine 0.57 - 1.00 mg/dL 0.69 0.59 0.74  Sodium 134 - 144 mmol/L 137 135 135  Potassium 3.5 - 5.2 mmol/L 4.8 3.6 3.7  Chloride 96 - 106 mmol/L 102 105 103  CO2 20 - 29 mmol/L 20 19(L) 21(L)  Calcium 8.7 - 10.2 mg/dL 9.5 8.7(L) 8.5(L)  Total Protein 6.0 - 8.5 g/dL 7.7 - -  Total Bilirubin 0.0 - 1.2 mg/dL 0.4 - -  Alkaline Phos 39 - 117 IU/L 72 - -  AST 0 - 40 IU/L 12 - -  ALT 0 - 32 IU/L 22 - -   Lipid Panel     Component Value Date/Time   CHOL 176 08/09/2018 0941   TRIG 228 (H) 08/09/2018 0941   HDL 40 08/09/2018 0941   CHOLHDL 4.4 08/09/2018 0941   CHOLHDL 5.2 (H) 11/19/2016 1103   VLDL 59 (H) 11/19/2016 1103   LDLCALC 90 08/09/2018 0941   LDLDIRECT 114.0 02/13/2015 0750    CBC    Component Value Date/Time   WBC 5.4 12/09/2017 0520   RBC 4.56 12/09/2017 0520   HGB 14.3 12/09/2017 0520   HGB 15.4 11/25/2017 1017   HCT 43.3 12/09/2017 0520   HCT 46.2 11/25/2017 1017   PLT 200 12/09/2017 0520   PLT 272 11/25/2017 1017   MCV 95.0 12/09/2017 0520   MCV 96 11/25/2017 1017   MCH 31.4 12/09/2017 0520   MCHC 33.0 12/09/2017 0520   RDW 12.8 12/09/2017 0520   RDW 13.7 11/25/2017 1017   LYMPHSABS 1.7 11/25/2017 1017   MONOABS 704  03/07/2016 0904   EOSABS 0.3 11/25/2017 1017   BASOSABS 0.1 11/25/2017 1017   Results for orders placed or performed in visit on 01/25/19  POCT glucose (manual entry)  Result Value Ref Range   POC Glucose 161 (A) 70 - 99 mg/dl  POCT glycosylated hemoglobin (Hb A1C)  Result Value Ref Range   Hemoglobin A1C     HbA1c POC (<> result, manual entry)     HbA1c,  POC (prediabetic range)     HbA1c, POC (controlled diabetic range) 8.1 (A) 0.0 - 7.0 %    ASSESSMENT AND PLAN: 1. Diabetes mellitus type 2, uncontrolled, with complications ([place]) Not at goal.  Encourage her to use her exercise bike at least 3 days a week if only for 15 minutes.  Encourage healthy eating habits.  Recommend increase Lantus to 72 units daily - POCT glucose (manual entry) - POCT glycosylated hemoglobin (Hb A1C) - metFORMIN (GLUCOPHAGE) 1000 MG tablet; Take 1 tablet (1,000 mg total) by mouth 2 (two) times daily with a meal.  Dispense: 180 tablet; Refill: 3 - glipiZIDE (GLUCOTROL) 10 MG tablet; Take 1 tablet (10 mg total) by mouth 2 (two) times daily before a meal.  Dispense: 60 tablet; Refill: 12 - Insulin Pen Needle (ULTICARE MICRO PEN NEEDLES) 32G X 4 MM MISC; 1 applicator by Does not apply route at bedtime.  Dispense: 100 each; Refill: 2  2. Essential hypertension At goal.  Continue current medications and low-salt diet  3. Hyperlipidemia, unspecified hyperlipidemia type Continue atorvastatin  4. Class 3 severe obesity with serious comorbidity and body mass index (BMI) of 40.0 to 44.9 in adult, unspecified obesity type ([place]) See #1 above  5. Atrial fibrillation  Followed by cardiology.  Doing okay on metoprolol and Xarelto     Patient was given the opportunity to ask questions.  Patient verbalized understanding of the plan and was able to repeat key elements of the plan.   Orders Placed This Encounter  Procedures  . POCT glucose (manual entry)  . POCT glycosylated hemoglobin (Hb A1C)     Requested Prescriptions    No prescriptions requested or ordered in this encounter    No follow-ups on file.  Karle Plumber, MD, FACP

## 2019-01-25 NOTE — Patient Instructions (Signed)
Increase Lantus to 72 units daily. Try to use your exercise bike at least 3 times a week for 15 minutes.

## 2019-01-26 MED FILL — metFORMIN HCL 1000 MG TABS: 1000 | 30 days supply | Qty: 60 | Fill #0

## 2019-01-26 MED FILL — TRUEPLUS PEN NDL 32GX5/32: 32G X 4 MM | 30 days supply | Qty: 100 | Fill #0

## 2019-01-26 MED FILL — glipiZIDE 10 MG TABS: 10 | 30 days supply | Qty: 60 | Fill #0

## 2019-01-26 MED FILL — TRUEPLUS PEN NDL 32GX5/32": 32G X 4 MM | 30 days supply | Qty: 100 | Fill #0

## 2019-02-01 ENCOUNTER — Other Ambulatory Visit: Payer: Self-pay | Admitting: Internal Medicine

## 2019-02-01 MED FILL — ATORVASTATIN CALCIUM 40 MG: 40 | 30 days supply | Qty: 30 | Fill #1

## 2019-02-01 MED FILL — DULoxetine HCL 20 MG CPEP: 20 | 30 days supply | Qty: 30 | Fill #2

## 2019-02-07 MED FILL — JARDIANCE 10 MG TABLET: 10 | 30 days supply | Qty: 30 | Fill #0

## 2019-02-15 MED FILL — LANTUS SOLOSTAR 100 UNITS/M: 100 | 20 days supply | Qty: 15 | Fill #1

## 2019-02-15 MED FILL — LISINOPRIL 10 MG TABS: 10 | 30 days supply | Qty: 15 | Fill #1

## 2019-02-15 MED FILL — XARELTO 20 MG TABLET: 20 | 30 days supply | Qty: 30 | Fill #5

## 2019-02-15 MED FILL — METOPROLOL TARTRATE 25 MG T: 25 | 90 days supply | Qty: 270 | Fill #2

## 2019-02-23 ENCOUNTER — Ambulatory Visit: Payer: BLUE CROSS/BLUE SHIELD | Attending: Family Medicine | Admitting: Physician Assistant

## 2019-02-23 ENCOUNTER — Other Ambulatory Visit: Payer: Self-pay

## 2019-02-23 VITALS — BP 127/84 | HR 69 | Temp 98.6°F | Resp 16 | Wt 249.2 lb

## 2019-02-23 DIAGNOSIS — E1165 Type 2 diabetes mellitus with hyperglycemia: Secondary | ICD-10-CM | POA: Diagnosis not present

## 2019-02-23 DIAGNOSIS — L03031 Cellulitis of right toe: Secondary | ICD-10-CM | POA: Diagnosis not present

## 2019-02-23 DIAGNOSIS — IMO0002 Reserved for concepts with insufficient information to code with codable children: Secondary | ICD-10-CM

## 2019-02-23 DIAGNOSIS — E118 Type 2 diabetes mellitus with unspecified complications: Secondary | ICD-10-CM

## 2019-02-23 MED ORDER — MUPIROCIN 2 % EX OINT: TOPICAL_OINTMENT | CUTANEOUS | 0 refills | Status: DC

## 2019-02-23 MED ORDER — CEPHALEXIN 500 MG PO CAPS: 500.0000 mg | ORAL_CAPSULE | Freq: Three times a day (TID) | ORAL | 0 refills | Status: DC

## 2019-02-23 MED ORDER — FLUCONAZOLE 150 MG PO TABS: 150.0000 mg | ORAL_TABLET | Freq: Once | ORAL | 0 refills | Status: AC

## 2019-02-23 MED FILL — glipiZIDE 10 MG TABS: 10 | 30 days supply | Qty: 60 | Fill #1

## 2019-02-23 MED FILL — CEPHALEXIN 500 MG CAPSULE: 500 | 7 days supply | Qty: 21 | Fill #0

## 2019-02-23 MED FILL — MUPIROCIN 2% OINTMENT: 2 | 7 days supply | Qty: 22 | Fill #0

## 2019-02-23 MED FILL — FLUCONAZOLE 150 MG TABS: 150 | 1 days supply | Qty: 1 | Fill #0

## 2019-02-23 MED FILL — metFORMIN HCL 1000 MG TABS: 1000 | 30 days supply | Qty: 60 | Fill #1

## 2019-02-23 NOTE — Progress Notes (Signed)
Patient ID: Kelly Lang, female   DOB: 1961-03-20, 58 y.o.   MRN: [id number]   Kelly Lang, is a 58 y.o. female  HKV:[id number]  FIE:[id number]  DOB - Mar 12, 1961  Subjective:  Chief Complaint and HPI: Kelly Lang is a 58 y.o. female here today for pain and redness in R great toe.  She has been doing some saline soaks.  No fever.  NKI or bite.  This has been present for about 2 weeks and is worsening.    Blood sugars running bt 100-200  ROS:   Constitutional:  No f/c, No night sweats, No unexplained weight loss. EENT:  No vision changes, No blurry vision, No hearing changes. No mouth, throat, or ear problems.  Respiratory: No cough, No SOB Cardiac: No CP, no palpitations GI:  No abd pain, No N/V/D. GU: No Urinary s/sx Musculoskeletal: No joint pain Neuro: No headache, no dizziness, no motor weakness.  Skin: No rash Endocrine:  No polydipsia. No polyuria.  Psych: Denies SI/HI  No problems updated.  ALLERGIES: Allergies  Allergen Reactions  . Codeine Itching and Rash    PAST MEDICAL HISTORY: Past Medical History:  Diagnosis Date  . Arthritis    "all over" (06/30/2017)  . Chicken pox   . Dyslipidemia   . H/O: hypothyroidism    a. thyroid biopsy 1996 with synthroid. Stopped taking rx and was loss to follow up b. normal thyroid function 10/20/13  . High cholesterol   . Hx of cardiovascular stress test    ETT/Lexiscan Myoview (12/2013):  No ischemia; not gated; low risk.  Marland Kitchen Hypertension   . Obesity   . Persistent atrial fibrillation    a diagnosed 10/25/2013  . Seasonal allergies   . Stroke [facility]) 2016   "some speech problems since" (06/30/2017)  . 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 2/2 Afib with RVR for unknown duration (EF: 40-45%)  . Thyroid disease   . Type II diabetes mellitus ([place])    a. hg A1c 10, newly diagnosed (10/26/13)    MEDICATIONS AT HOME: Prior to Admission medications   Medication Sig Start Date End Date Taking? Authorizing  Provider  atorvastatin (LIPITOR) 40 MG tablet TAKE 1 TABLET (40 MG TOTAL) BY MOUTH DAILY. 01/05/19   Ladell Pier, MD  Blood Glucose Monitoring Suppl ([place]) w/Device KIT Use as instructed to check blood sugar up to 3 times daily. 10/05/18   Ladell Pier, MD  cephALEXin (KEFLEX) 500 MG capsule Take 1 capsule (500 mg total) by mouth 3 (three) times daily. 02/23/19   Argentina Donovan, PA-C  diclofenac sodium (VOLTAREN) 1 % GEL Apply 2 g topically 4 (four) times daily as needed (pain). 04/29/18   Ladell Pier, MD  DULoxetine (CYMBALTA) 20 MG capsule TAKE 1 CAPSULE (20 MG TOTAL) BY MOUTH DAILY. 12/09/18   Ladell Pier, MD  fluconazole (DIFLUCAN) 150 MG tablet Take 1 tablet (150 mg total) by mouth once for 1 dose. 02/23/19 02/23/19  Argentina Donovan, PA-C  glipiZIDE (GLUCOTROL) 10 MG tablet Take 1 tablet (10 mg total) by mouth 2 (two) times daily before a meal. 01/25/19   Ladell Pier, MD  glucose blood (ONETOUCH VERIO) test strip Use as instructed to check blood sugar up to 3 times daily. 10/05/18   Ladell Pier, MD  Insulin Pen Needle (ULTICARE MICRO PEN NEEDLES) 32G X 4 MM MISC 1 applicator by Does not apply route at bedtime. 01/25/19   Ladell Pier, MD  JARDIANCE 10 MG TABS tablet TAKE 1 TABLET BY MOUTH DAILY. 02/01/19   Ladell Pier, MD  LANTUS SOLOSTAR 100 UNIT/ML Solostar Pen INJECT 70 UNITS INTO THE SKIN DAILY. 11/05/18   Ladell Pier, MD  lisinopril (PRINIVIL,ZESTRIL) 10 MG tablet TAKE 1/2 TABLET BY MOUTH DAILY. 01/21/19   Ladell Pier, MD  metFORMIN (GLUCOPHAGE) 1000 MG tablet Take 1 tablet (1,000 mg total) by mouth 2 (two) times daily with a meal. 01/25/19   Ladell Pier, MD  metoCLOPramide (REGLAN) 5 MG tablet Take 1 tab by mouth  30 min before meals as needed. 06/09/18   Esterwood, Amy S, PA-C  metoprolol tartrate (LOPRESSOR) 25 MG tablet TAKE 1 & 1/2 TABLET BY MOUTH 2 TIMES DAILY 07/28/18   Nahser, Wonda Cheng, MD  mupirocin  ointment (BACTROBAN) 2 % Apply to AA bid X1 week 02/23/19   Argentina Donovan, PA-C  omeprazole (PRILOSEC) 40 MG capsule Take 1 capsule (40 mg total) by mouth daily. 12/09/17   Hongalgi, Lenis Dickinson, MD  [facility] DELICA LANCETS 86P MISC Use as instructed to check blood sugar up to 3 times daily. 10/05/18   Ladell Pier, MD  triamcinolone cream (KENALOG) 0.1 % Apply 1 application topically 2 (two) times daily as needed (rash). 04/29/18   Ladell Pier, MD  XARELTO 20 MG TABS tablet TAKE 1 TABLET BY MOUTH DAILY WITH SUPPER. 09/06/18   Allred, Jeneen Rinks, MD     Objective:  EXAM:   Vitals:   02/23/19 1121  BP: 127/84  Pulse: 69  Resp: 16  Temp: 98.6 F (37 C)  TempSrc: Oral  SpO2: 95%  Weight: 249 lb 3.2 oz (113 kg)    General appearance : A&OX3. NAD. Non-toxic-appearing HEENT: Atraumatic and Normocephalic.  PERRLA. EOM intact.  Chest/Lungs:  Breathing-non-labored, Good air entry bilaterally, breath sounds normal without rales, rhonchi, or wheezing  CVS: S1 S2 regular, no murmurs, gallops, rubs  Extremities: Bilateral Lower Ext shows no edema, both legs are warm to touch with = pulse throughout Neurology:  CN II-XII grossly intact, Non focal.   Psych:  TP linear. J/I WNL. Normal speech. Appropriate eye contact and affect.  Skin:  No Rash  Data Review Lab Results  Component Value Date   HGBA1C 8.1 (A) 01/25/2019   HGBA1C 7.7 (A) 08/09/2018   HGBA1C 7.3 03/31/2018     Assessment & Plan   1. Paronychia of great toe of right foot - cephALEXin (KEFLEX) 500 MG capsule; Take 1 capsule (500 mg total) by mouth 3 (three) times daily.  Dispense: 21 capsule; Refill: 0 - fluconazole (DIFLUCAN) 150 MG tablet; Take 1 tablet (150 mg total) by mouth once for 1 dose.  Dispense: 1 tablet; Refill: 0 - mupirocin ointment (BACTROBAN) 2 %; Apply to AA bid X1 week  Dispense: 22 g; Refill: 0  2. Diabetes mellitus type 2, uncontrolled, with complications ([place]) Not at goal at last visit.  Follow  current plan and diabetic diet.    Patient have been counseled extensively about nutrition and exercise  Return in about 3 months (around 05/26/2019) for dr Wynetta Emery for chronic conditions.  The patient was given clear instructions to go to ER or return to medical center if symptoms don't improve, worsen or new problems develop. The patient verbalized understanding. The patient was told to call to get lab results if they haven't heard anything in the next week.     Freeman Caldron, PA-C [place] and [place], [place]  [phone]   02/23/2019, 11:31 AM

## 2019-02-23 NOTE — Patient Instructions (Signed)
Paronychia  Paronychia is an infection of the skin. It happens near a fingernail or toenail. It may cause pain and swelling around the nail. In some cases, a fluid-filled bump (abscess) can form near or under the nail.  Usually, this condition is not serious, and it clears up with treatment.  Follow these instructions at home:  Wound care   Keep the affected area clean.   Soak the fingers or toes in warm water as told by your doctor. You may be told to do this for 20 minutes, 2-3 times a day.   Keep the area dry when you are not soaking it.   Do not try to drain a fluid-filled bump on your own.   Follow instructions from your doctor about how to take care of the affected area. Make sure you:  ? Wash your hands with soap and water before you change your bandage (dressing). If you cannot use soap and water, use hand sanitizer.  ? Change your bandage as told by your doctor.   If you had a fluid-filled bump and your doctor drained it, check the area every day for signs of infection. Check for:  ? Redness, swelling, or pain.  ? Fluid or blood.  ? Warmth.  ? Pus or a bad smell.  Medicines     Take over-the-counter and prescription medicines only as told by your doctor.   If you were prescribed an antibiotic medicine, take it as told by your doctor. Do not stop taking it even if you start to feel better.  General instructions   Avoid touching any chemicals.   Do not pick at the affected area.  Prevention   To prevent this condition from happening again:  ? Wear rubber gloves when putting your hands in water for washing dishes or other tasks.  ? Wear gloves if your hands might touch cleaners or chemicals.  ? Avoid injuring your nails or fingertips.  ? Do not bite your nails or tear hangnails.  ? Do not cut your nails very short.  ? Do not cut the skin at the base and sides of the nail (cuticles).  ? Use clean nail clippers or scissors when trimming nails.  Contact a doctor if:   You feel worse.   You do not get  better.   You have more fluid, blood, or pus coming from the affected area.   Your finger or knuckle is swollen or is hard to move.  Get help right away if you have:   A fever or chills.   Redness spreading from the affected area.   Pain in a joint or muscle.  Summary   Paronychia is an infection of the skin. It happens near a fingernail or toenail.   This condition may cause pain and swelling around the nail.   Soak the fingers or toes in warm water as told by your doctor.   Usually, this condition is not serious, and it clears up with treatment.  This information is not intended to replace advice given to you by your health care provider. Make sure you discuss any questions you have with your health care provider.  Document Released: 11/05/2009 Document Revised: 11/30/2017 Document Reviewed: 11/30/2017  Elsevier Interactive Patient Education  2019 [employer]

## 2019-03-07 ENCOUNTER — Other Ambulatory Visit: Payer: Self-pay | Admitting: Internal Medicine

## 2019-03-07 DIAGNOSIS — G894 Chronic pain syndrome: Secondary | ICD-10-CM

## 2019-03-07 DIAGNOSIS — M2550 Pain in unspecified joint: Secondary | ICD-10-CM

## 2019-03-07 MED FILL — ATORVASTATIN CALCIUM 40 MG: 40 | 30 days supply | Qty: 30 | Fill #2

## 2019-03-07 MED FILL — DULoxetine HCL 20 MG CPEP: 20 | 30 days supply | Qty: 30 | Fill #0

## 2019-03-07 MED FILL — JARDIANCE 10 MG TABLET: 10 | 30 days supply | Qty: 30 | Fill #1

## 2019-03-09 ENCOUNTER — Telehealth (INDEPENDENT_AMBULATORY_CARE_PROVIDER_SITE_OTHER): Payer: BLUE CROSS/BLUE SHIELD | Admitting: Internal Medicine

## 2019-03-09 ENCOUNTER — Telehealth: Payer: Self-pay

## 2019-03-09 VITALS — BP 156/95 | HR 104 | Wt 248.0 lb

## 2019-03-09 DIAGNOSIS — I10 Essential (primary) hypertension: Secondary | ICD-10-CM

## 2019-03-09 DIAGNOSIS — I428 Other cardiomyopathies: Secondary | ICD-10-CM | POA: Diagnosis not present

## 2019-03-09 DIAGNOSIS — I4819 Other persistent atrial fibrillation: Secondary | ICD-10-CM

## 2019-03-09 NOTE — Telephone Encounter (Signed)
Spoke with pt in regards to appt on 03/09/19. Pt stated is unable to check vitals and upload EKG. Pt was advise to use most recent vitals from appt last week. Pt questions and concerns were address.

## 2019-03-09 NOTE — Progress Notes (Signed)
Electrophysiology TeleHealth Note   Due to national recommendations of social distancing due to COVID 19, an audio/video telehealth visit is felt to be most appropriate for this patient at this time.  See MyChart message from today for the patient's consent to telehealth for [facility].   Date:  03/09/2019   ID:  Kelly Lang, DOB 1961/02/10, MRN [id number]  Location: patient's home  Provider location: [address] [place]  Evaluation Performed: Follow-up visit  PCP:  Ladell Pier, MD  Cardiologist:  Mertie Moores, MD Electrophysiologist:  Dr Rayann Heman  Chief Complaint:  afib  History of Present Illness:    Kelly Lang is a 58 y.o. female who presents via audio/video conferencing for a telehealth visit today.  Since last being seen in our clinic, the patient reports doing very well.  Today, she denies symptoms of palpitations, chest pain, shortness of breath,  lower extremity edema, dizziness, presyncope, or syncope.  She has a toe infection for which she was recently treated with antibiotics.  Healing nicely.  The patient is otherwise without complaint today.  The patient denies symptoms of fevers, chills, cough, or new SOB worrisome for COVID 19.  Past Medical History:  Diagnosis Date  . Arthritis    "all over" (06/30/2017)  . Chicken pox   . Dyslipidemia   . H/O: hypothyroidism    a. thyroid biopsy 1996 with synthroid. Stopped taking rx and was loss to follow up b. normal thyroid function 10/20/13  . High cholesterol   . Hx of cardiovascular stress test    ETT/Lexiscan Myoview (12/2013):  No ischemia; not gated; low risk.  Marland Kitchen Hypertension   . Obesity   . Persistent atrial fibrillation    a diagnosed 10/25/2013  . Seasonal allergies   . Stroke [facility]) 2016   "some speech problems since" (06/30/2017)  . 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 2/2 Afib with RVR for unknown duration (EF: 40-45%)  . Thyroid disease   . Type II diabetes  mellitus ([place])    a. hg A1c 10, newly diagnosed (10/26/13)    Past Surgical History:  Procedure Laterality Date  . ATRIAL FIBRILLATION ABLATION  06/30/2017  . ATRIAL FIBRILLATION ABLATION N/A 06/30/2017   Procedure: Atrial Fibrillation Ablation;  Surgeon: Thompson Grayer, MD;  Location: [place] [facility];  Service: Cardiovascular;  Laterality: N/A;  . BIOPSY THYROID  1996  . CARDIOVERSION N/A 12/07/2013   Procedure: CARDIOVERSION;  Surgeon: Casandra Doffing, MD;  Location: [place];  Service: Cardiovascular;  Laterality: N/A;  . CARDIOVERSION N/A 03/10/2016   Procedure: CARDIOVERSION;  Surgeon: Thayer Headings, MD;  Location: [facility] ENDOSCOPY;  Service: Cardiovascular;  Laterality: N/A;  . ESOPHAGOGASTRODUODENOSCOPY N/A 12/07/2017   Procedure: ESOPHAGOGASTRODUODENOSCOPY (EGD);  Surgeon: Irene Shipper, MD;  Location: [facility] ENDOSCOPY;  Service: Endoscopy;  Laterality: N/A;  . EYE MUSCLE SURGERY Right ~ 1966  . TEE WITHOUT CARDIOVERSION N/A 06/29/2017   Procedure: TRANSESOPHAGEAL ECHOCARDIOGRAM (TEE);  Surgeon: Fay Records, MD;  Location: [facility] ENDOSCOPY;  Service: Cardiovascular;  Laterality: N/A;  . TONSILLECTOMY  1971    Current Outpatient Medications  Medication Sig Dispense Refill  . atorvastatin (LIPITOR) 40 MG tablet TAKE 1 TABLET (40 MG TOTAL) BY MOUTH DAILY. 30 tablet 2  . Blood Glucose Monitoring Suppl ([place]) w/Device KIT Use as instructed to check blood sugar up to 3 times daily. 1 kit 0  . cephALEXin (KEFLEX) 500 MG capsule Take 1 capsule (500 mg total)  by mouth 3 (three) times daily. 21 capsule 0  . diclofenac sodium (VOLTAREN) 1 % GEL Apply 2 g topically 4 (four) times daily as needed (pain). 100 g 4  . DULoxetine (CYMBALTA) 20 MG capsule TAKE 1 CAPSULE (20 MG TOTAL) BY MOUTH DAILY. 30 capsule 2  . glipiZIDE (GLUCOTROL) 10 MG tablet Take 1 tablet (10 mg total) by mouth 2 (two) times daily before a meal. 60 tablet 12  . glucose blood (ONETOUCH VERIO) test strip Use as  instructed to check blood sugar up to 3 times daily. 100 each 11  . Insulin Pen Needle (ULTICARE MICRO PEN NEEDLES) 32G X 4 MM MISC 1 applicator by Does not apply route at bedtime. 100 each 2  . JARDIANCE 10 MG TABS tablet TAKE 1 TABLET BY MOUTH DAILY. 30 tablet 2  . LANTUS SOLOSTAR 100 UNIT/ML Solostar Pen INJECT 70 UNITS INTO THE SKIN DAILY. 15 mL 2  . lisinopril (PRINIVIL,ZESTRIL) 10 MG tablet TAKE 1/2 TABLET BY MOUTH DAILY. 15 tablet 2  . metFORMIN (GLUCOPHAGE) 1000 MG tablet Take 1 tablet (1,000 mg total) by mouth 2 (two) times daily with a meal. 180 tablet 3  . metoCLOPramide (REGLAN) 5 MG tablet Take 1 tab by mouth  30 min before meals as needed. 90 tablet 5  . metoprolol tartrate (LOPRESSOR) 25 MG tablet TAKE 1 & 1/2 TABLET BY MOUTH 2 TIMES DAILY 270 tablet 2  . mupirocin ointment (BACTROBAN) 2 % Apply to AA bid X1 week 22 g 0  . omeprazole (PRILOSEC) 40 MG capsule Take 1 capsule (40 mg total) by mouth daily.    Glory Rosebush DELICA LANCETS 67H MISC Use as instructed to check blood sugar up to 3 times daily. 100 each 11  . triamcinolone cream (KENALOG) 0.1 % Apply 1 application topically 2 (two) times daily as needed (rash). 45 g 1  . XARELTO 20 MG TABS tablet TAKE 1 TABLET BY MOUTH DAILY WITH SUPPER. 30 tablet 5   No current facility-administered medications for this visit.     Allergies:   Codeine   Social History:  The patient  reports that she quit smoking about 8 years ago. Her smoking use included cigarettes. She has a 22.00 pack-year smoking history. She has never used smokeless tobacco. She reports current alcohol use. She reports that she does not use drugs.   Family History:  The patient's  family history includes Hyperlipidemia in her mother; Hypertension in her mother. She was adopted.   ROS:  Please see the history of present illness.   All other systems are personally reviewed and negative.    Exam:    Vital Signs:  LMP 04/15/2015 (LMP Unknown) Comment: last in may   Well appearing, alert and conversant, regular work of breathing,  good skin color Eyes- anicteric, neuro- grossly intact, skin- no apparent rash or lesions or cyanosis, mouth- oral mucosa is pink   Labs/Other Tests and Data Reviewed:    Recent Labs: 08/09/2018: ALT 22; BUN 16; Creatinine, Ser 0.69; Potassium 4.8; Sodium 137   Wt Readings from Last 3 Encounters:  02/23/19 249 lb 3.2 oz (113 kg)  01/25/19 247 lb 12.8 oz (112.4 kg)  10/11/18 248 lb (112.5 kg)     Other studies personally reviewed: Additional studies/ records that were reviewed today include: muy prior notes, Dr Lanny Hurst notes , my prior notes   ASSESSMENT & PLAN:    1.  Persistent atrial fibrillation Maintaining sinus rhythm post ablation off AAD therapy chads2vasc score  is 6.  She is on xarelto We discussed Kardia mobile as an options for her to look into  2. HTN Elevated today She feels that this is due to "rushing around".  She does not wish to make changes at this time.  3. Overweight Lifestyle modification is encouraged  4. Nonischemic CM EF resolved with sinus rhythm  5. COVID 19 screen The patient denies symptoms of COVID 19 at this time.  The importance of social distancing was discussed today.  Follow-up: Dr Acie Fredrickson in 6 months,  I will see in a yea  Current medicines are reviewed at length with the patient today.   The patient does not have concerns regarding her medicines.  The following changes were made today:  none  Labs/ tests ordered today include:  No orders of the defined types were placed in this encounter.   Patient Risk:  after full review of this patients clinical status, I feel that they are at moderate risk at this time.  Today, I have spent 20 minutes with the patient with telehealth technology discussing afib .    Army Fossa, MD  03/09/2019 3:22 PM     [address] [place]  [address] [phone] (office) [phone]  (fax)

## 2019-03-10 DIAGNOSIS — L400 Psoriasis vulgaris: Secondary | ICD-10-CM | POA: Diagnosis not present

## 2019-03-11 ENCOUNTER — Other Ambulatory Visit: Payer: Self-pay | Admitting: Internal Medicine

## 2019-03-11 MED FILL — XARELTO 20 MG TABLET: 20 | 30 days supply | Qty: 30 | Fill #0

## 2019-03-11 MED FILL — LISINOPRIL 10 MG TABS: 10 | 30 days supply | Qty: 15 | Fill #2

## 2019-03-11 MED FILL — LANTUS SOLOSTAR 100 UNITS/M: 100 | 20 days supply | Qty: 15 | Fill #2

## 2019-03-11 NOTE — Telephone Encounter (Signed)
Pt is a 58 yr old female who saw Dr. Johney Frame on 03/09/19 during a video call. Her last weight on 02/23/19 was 113Kg. SCr on 08/09/18 was 0.69. CrCl is 165mL/min. Will refill Xarelto 20mg  QD.

## 2019-03-12 NOTE — Telephone Encounter (Signed)
error 

## 2019-04-01 ENCOUNTER — Other Ambulatory Visit: Payer: Self-pay | Admitting: Internal Medicine

## 2019-04-01 MED FILL — JARDIANCE 10 MG TABLET: 10 | 30 days supply | Qty: 30 | Fill #2

## 2019-04-01 MED FILL — DULoxetine HCL 20 MG CPEP: 20 | 30 days supply | Qty: 30 | Fill #1

## 2019-04-01 MED FILL — ATORVASTATIN CALCIUM 40 MG: 40 | 30 days supply | Qty: 30 | Fill #0

## 2019-04-01 MED FILL — LANTUS SOLOSTAR 100 UNITS/M: 100 | 20 days supply | Qty: 15 | Fill #3

## 2019-04-26 ENCOUNTER — Other Ambulatory Visit: Payer: Self-pay | Admitting: Cardiovascular Disease

## 2019-04-26 ENCOUNTER — Other Ambulatory Visit: Payer: Self-pay | Admitting: Internal Medicine

## 2019-04-26 DIAGNOSIS — M79604 Pain in right leg: Secondary | ICD-10-CM

## 2019-04-26 DIAGNOSIS — I10 Essential (primary) hypertension: Secondary | ICD-10-CM

## 2019-04-26 DIAGNOSIS — M79605 Pain in left leg: Secondary | ICD-10-CM

## 2019-04-26 DIAGNOSIS — M79601 Pain in right arm: Secondary | ICD-10-CM

## 2019-04-26 MED FILL — metFORMIN HCL 1000 MG TABS: 1000 | 30 days supply | Qty: 60 | Fill #2

## 2019-04-26 MED FILL — OMEPRAZOLE 20 MG CAP: 20 | 30 days supply | Qty: 30 | Fill #4

## 2019-04-26 MED FILL — DICLOFENAC SODIUM 1% GEL: 1 | 12 days supply | Qty: 100 | Fill #0

## 2019-04-26 MED FILL — XARELTO 20 MG TABLET: 20 | 30 days supply | Qty: 30 | Fill #1

## 2019-04-26 MED FILL — METOPROLOL TARTRATE 25 MG T: 25 | 90 days supply | Qty: 270 | Fill #0

## 2019-04-26 MED FILL — METOCLOPRAMIDE 5 MG TABLET: 5 | 30 days supply | Qty: 30 | Fill #4

## 2019-04-26 MED FILL — LISINOPRIL 10 MG TABS: 10 | 30 days supply | Qty: 15 | Fill #0

## 2019-04-26 MED FILL — glipiZIDE 10 MG TABS: 10 | 30 days supply | Qty: 60 | Fill #2

## 2019-05-02 ENCOUNTER — Other Ambulatory Visit: Payer: Self-pay | Admitting: Internal Medicine

## 2019-05-02 MED FILL — DULoxetine HCL 20 MG CPEP: 20 | 30 days supply | Qty: 30 | Fill #2

## 2019-05-02 MED FILL — ATORVASTATIN CALCIUM 40 MG: 40 | 30 days supply | Qty: 30 | Fill #1

## 2019-05-02 MED FILL — LANTUS SOLOSTAR 100 UNITS/M: 100 | 20 days supply | Qty: 15 | Fill #4

## 2019-05-02 MED FILL — JARDIANCE 10 MG TABLET: 10 | 30 days supply | Qty: 30 | Fill #0

## 2019-05-16 DIAGNOSIS — L400 Psoriasis vulgaris: Secondary | ICD-10-CM | POA: Diagnosis not present

## 2019-05-16 DIAGNOSIS — L718 Other rosacea: Secondary | ICD-10-CM | POA: Diagnosis not present

## 2019-05-30 ENCOUNTER — Other Ambulatory Visit: Payer: Self-pay | Admitting: Internal Medicine

## 2019-05-30 DIAGNOSIS — M2550 Pain in unspecified joint: Secondary | ICD-10-CM

## 2019-05-30 DIAGNOSIS — G894 Chronic pain syndrome: Secondary | ICD-10-CM

## 2019-05-30 MED FILL — METOCLOPRAMIDE 5 MG TABLET: 5 | 30 days supply | Qty: 30 | Fill #5

## 2019-05-30 MED FILL — LISINOPRIL 10 MG TABS: 10 | 30 days supply | Qty: 15 | Fill #1

## 2019-05-30 MED FILL — OMEPRAZOLE 20 MG CAP: 20 | 30 days supply | Qty: 30 | Fill #5

## 2019-05-30 MED FILL — ATORVASTATIN CALCIUM 40 MG: 40 | 30 days supply | Qty: 30 | Fill #2

## 2019-05-30 MED FILL — metFORMIN HCL 1000 MG TABS: 1000 | 30 days supply | Qty: 60 | Fill #3

## 2019-05-30 MED FILL — glipiZIDE 10 MG TABS: 10 | 30 days supply | Qty: 60 | Fill #3

## 2019-05-30 MED FILL — DULoxetine HCL 20 MG CPEP: 20 | 30 days supply | Qty: 30 | Fill #0

## 2019-05-30 MED FILL — JARDIANCE 10 MG TABLET: 10 | 30 days supply | Qty: 30 | Fill #1

## 2019-05-30 MED FILL — LANTUS SOLOSTAR 100 UNITS/M: 100 | 20 days supply | Qty: 15 | Fill #5

## 2019-05-30 MED FILL — DICLOFENAC SODIUM 1% GEL: 1 | 12 days supply | Qty: 100 | Fill #1

## 2019-06-02 DIAGNOSIS — L400 Psoriasis vulgaris: Secondary | ICD-10-CM | POA: Diagnosis not present

## 2019-06-07 MED FILL — XARELTO 20 MG TABLET: 20 | 30 days supply | Qty: 30 | Fill #2

## 2019-06-27 MED FILL — DULoxetine HCL 20 MG CPEP: 20 | 30 days supply | Qty: 30 | Fill #1

## 2019-06-27 MED FILL — LANTUS SOLOSTAR 100 UNITS/M: 100 | 20 days supply | Qty: 15 | Fill #6

## 2019-07-11 MED FILL — XARELTO 20 MG TABLET: 20 | 30 days supply | Qty: 30 | Fill #3

## 2019-07-21 MED FILL — LISINOPRIL 10 MG TABS: 10 | 30 days supply | Qty: 15 | Fill #2

## 2019-07-21 MED FILL — JARDIANCE 10 MG TABLET: 10 | 30 days supply | Qty: 30 | Fill #2

## 2019-07-21 MED FILL — LANTUS SOLOSTAR 100 UNITS/M: 100 | 20 days supply | Qty: 15 | Fill #7

## 2019-08-09 ENCOUNTER — Other Ambulatory Visit: Payer: Self-pay | Admitting: Internal Medicine

## 2019-08-09 MED FILL — DULoxetine HCL 20 MG CPEP: 20 | 30 days supply | Qty: 30 | Fill #2

## 2019-08-09 MED FILL — metFORMIN HCL 1000 MG TABS: 1000 | 30 days supply | Qty: 60 | Fill #4

## 2019-08-09 MED FILL — XARELTO 20 MG TABLET: 20 | 30 days supply | Qty: 30 | Fill #4

## 2019-08-09 MED FILL — glipiZIDE 10 MG TABS: 10 | 30 days supply | Qty: 60 | Fill #4

## 2019-08-10 ENCOUNTER — Other Ambulatory Visit: Payer: Self-pay | Admitting: Pharmacist

## 2019-08-10 MED ORDER — ATORVASTATIN CALCIUM 40 MG PO TABS: 40.0000 mg | ORAL_TABLET | Freq: Every day | ORAL | 2 refills | Status: DC

## 2019-08-10 MED FILL — ATORVASTATIN CALCIUM 40 MG: 40 | 30 days supply | Qty: 30 | Fill #0

## 2019-08-15 ENCOUNTER — Other Ambulatory Visit: Payer: Self-pay | Admitting: Internal Medicine

## 2019-08-15 DIAGNOSIS — I10 Essential (primary) hypertension: Secondary | ICD-10-CM

## 2019-08-15 MED FILL — DICLOFENAC SODIUM 1% GEL: 1 | 12 days supply | Qty: 100 | Fill #2

## 2019-08-15 MED FILL — LANTUS SOLOSTAR 100 UNITS/M: 100 | 20 days supply | Qty: 15 | Fill #0

## 2019-08-15 MED FILL — JARDIANCE 10 MG TABLET: 10 | 30 days supply | Qty: 30 | Fill #0

## 2019-08-15 MED FILL — LISINOPRIL 10 MG TABS: 10 | 15 days supply | Qty: 15 | Fill #0

## 2019-09-05 DIAGNOSIS — L400 Psoriasis vulgaris: Secondary | ICD-10-CM | POA: Diagnosis not present

## 2019-09-08 ENCOUNTER — Encounter: Payer: Self-pay | Admitting: Internal Medicine

## 2019-09-08 ENCOUNTER — Other Ambulatory Visit: Payer: Self-pay

## 2019-09-08 ENCOUNTER — Ambulatory Visit: Payer: BC Managed Care – PPO | Attending: Internal Medicine | Admitting: Internal Medicine

## 2019-09-08 VITALS — BP 111/79 | HR 68 | Temp 98.1°F | Resp 18 | Ht 65.5 in | Wt 249.0 lb

## 2019-09-08 DIAGNOSIS — Z1231 Encounter for screening mammogram for malignant neoplasm of breast: Secondary | ICD-10-CM

## 2019-09-08 DIAGNOSIS — E1142 Type 2 diabetes mellitus with diabetic polyneuropathy: Secondary | ICD-10-CM | POA: Diagnosis not present

## 2019-09-08 DIAGNOSIS — I10 Essential (primary) hypertension: Secondary | ICD-10-CM

## 2019-09-08 DIAGNOSIS — Z794 Long term (current) use of insulin: Secondary | ICD-10-CM

## 2019-09-08 DIAGNOSIS — E785 Hyperlipidemia, unspecified: Secondary | ICD-10-CM

## 2019-09-08 DIAGNOSIS — M2550 Pain in unspecified joint: Secondary | ICD-10-CM

## 2019-09-08 DIAGNOSIS — Z23 Encounter for immunization: Secondary | ICD-10-CM

## 2019-09-08 DIAGNOSIS — Z6841 Body Mass Index (BMI) 40.0 and over, adult: Secondary | ICD-10-CM

## 2019-09-08 DIAGNOSIS — K219 Gastro-esophageal reflux disease without esophagitis: Secondary | ICD-10-CM

## 2019-09-08 DIAGNOSIS — E1169 Type 2 diabetes mellitus with other specified complication: Secondary | ICD-10-CM | POA: Diagnosis not present

## 2019-09-08 DIAGNOSIS — Z8679 Personal history of other diseases of the circulatory system: Secondary | ICD-10-CM

## 2019-09-08 DIAGNOSIS — I428 Other cardiomyopathies: Secondary | ICD-10-CM

## 2019-09-08 LAB — POCT GLYCOSYLATED HEMOGLOBIN (HGB A1C): Hemoglobin A1C: 9.2 % — AB (ref 4.0–5.6)

## 2019-09-08 LAB — GLUCOSE, POCT (MANUAL RESULT ENTRY): POC Glucose: 243 mg/dl — AB (ref 70–99)

## 2019-09-08 MED ORDER — LANTUS SOLOSTAR 100 UNIT/ML ~~LOC~~ SOPN: 80.0000 [IU] | PEN_INJECTOR | Freq: Every day | SUBCUTANEOUS | 5 refills | Status: DC

## 2019-09-08 MED ORDER — DULOXETINE HCL 20 MG PO CPEP: 20.0000 mg | ORAL_CAPSULE | Freq: Every day | ORAL | 2 refills | Status: DC

## 2019-09-08 MED ORDER — DICLOFENAC SODIUM 1 % TD GEL: TRANSDERMAL | 5 refills | Status: DC

## 2019-09-08 MED ORDER — LISINOPRIL 10 MG PO TABS: 5.0000 mg | ORAL_TABLET | Freq: Every day | ORAL | 0 refills | Status: DC

## 2019-09-08 MED ORDER — JARDIANCE 10 MG PO TABS: 10.0000 mg | ORAL_TABLET | Freq: Every day | ORAL | 0 refills | Status: DC

## 2019-09-08 MED FILL — DICLOFENAC SODIUM 1% GEL: 1 | 12 days supply | Qty: 100 | Fill #0

## 2019-09-08 MED FILL — DULoxetine HCL 20 MG CPEP: 20 | 30 days supply | Qty: 30 | Fill #0

## 2019-09-08 MED FILL — LANTUS SOLOSTAR 100 UNITS/M: 100 | 30 days supply | Qty: 24 | Fill #0

## 2019-09-08 MED FILL — LISINOPRIL 10 MG TABS: 10 | 30 days supply | Qty: 15 | Fill #0

## 2019-09-08 MED FILL — JARDIANCE 10 MG TABLET: 10 | 30 days supply | Qty: 30 | Fill #0

## 2019-09-08 NOTE — Patient Instructions (Signed)
Increase Lantus to 80 units daily. I have referred you to the endocrinologist and ophthalmologist. I have referred you to the nutritionist.

## 2019-09-08 NOTE — Progress Notes (Signed)
Patient ID: Kelly Lang, female    DOB: December 20, 1960  MRN: [id number]  CC: Medication Management   Subjective: Kelly Lang is a 58 y.o. female who presents for chronic ds management Her concerns today include:  Pt with hx of a.flutters/p ablation7/2018on Xarelto, DM type 2 ,gastroparesis,obesity, HL, HTN, chronic systolic CHF with BL39-03%, CVA, polyarthalgia on Cymbalta.  DIABETES TYPE 2 Last A1C:   Results for orders placed or performed in visit on 09/08/19  HgB A1c  Result Value Ref Range   Hemoglobin A1C 9.2 (A) 4.0 - 5.6 %   HbA1c POC (<> result, manual entry)     HbA1c, POC (prediabetic range)     HbA1c, POC (controlled diabetic range)    Glucose (CBG)  Result Value Ref Range   POC Glucose 243 (A) 70 - 99 mg/dl    Med Adherence:  '[x]'  Yes - up to 75 units of Lantus daily.  Compliant with orals including jardiance, Glucotrol and Meformin.  Occasionally has to use Reglan for GI upset   '[]'  No Medication side effects:  '[]'  Yes    '[]'  No Home Monitoring?  '[x]'  Yes  1-2 x a day  '[]'  No Home glucose results range: 200 in mornings and before dinner 160-170 Diet Adherence:  "I'm not doing too too bad."  Has oatmeal or chicken biscuit in mornings and sugar free coffee. Some days she skips lunch but soup or salad when she does Exercise: '[x]'  Yes - but not much lately Hypoglycemic episodes?: '[]'  Yes    '[]'  No Numbness of the feet? '[x]'  Yes more recently and increasingly    '[]'  No Retinopathy hx? '[]'  Yes    '[]'  No Last eye exam: Overdue for eye exam. Comments:   GERD:  Taking Prilosec daily for past 2 wks due to heartburn.  Good results when she takes the medication drinking cold MacDonald coffee every morning with some vanilla sweetener for past few wks.  She thinks it may be causing heartburn  A.fluter:  No bruising or bleeding on Xarelto.  She has not had any palpitations  She continues to see the dermatologist.  She is on Skyrizi for psoriasis with good results.  On  metronidazole gel for rosacea and a steroid cream to use on her scalp.  HTN/NICM due to tachycardia: Reports compliance with taking lisinopril and metoprolol.  Denies any chest pains or shortness of breath.  No lower extremity edema.  She limits salt in the foods. HL: Compliant with taking atorvastatin.  Requests refills on several of her medications including Cymbalta which she takes for polyarthralgia and mood swings with good results Patient Active Problem List   Diagnosis Date Noted  . Chronic pain disorder 08/09/2018  . Diabetic polyneuropathy associated with type 2 diabetes mellitus ([place]) 08/09/2018  . Gastroparesis 12/21/2017  . GI bleed 12/06/2017  . Hepatic steatosis 12/04/2017  . Chronic systolic CHF (congestive heart failure) ([place]) 09/16/2015  . Cerebrovascular accident (CVA) due to embolism of left middle cerebral artery ([place])   . Thyroid mass 09/15/2015  . Low back pain 04/03/2014  . Unspecified vitamin D deficiency 01/20/2014  . Essential hypertension, benign 01/20/2014  . Chronic anticoagulation 10/31/2013  . Tachycardia induced cardiomyopathy ([place])   . H/O: hypothyroidism   . Obesity   . IDDM (insulin dependent diabetes mellitus)   . Arthritis   . Dyslipidemia   . Atrial fibrillation  10/25/2013     Current Outpatient Medications on File Prior to Visit  Medication Sig Dispense Refill  .  atorvastatin (LIPITOR) 40 MG tablet Take 1 tablet (40 mg total) by mouth daily. 30 tablet 2  . Blood Glucose Monitoring Suppl ([place]) w/Device KIT Use as instructed to check blood sugar up to 3 times daily. 1 kit 0  . glipiZIDE (GLUCOTROL) 10 MG tablet Take 1 tablet (10 mg total) by mouth 2 (two) times daily before a meal. 60 tablet 12  . glucose blood (ONETOUCH VERIO) test strip Use as instructed to check blood sugar up to 3 times daily. 100 each 11  . Insulin Pen Needle (ULTICARE MICRO PEN NEEDLES) 32G X 4 MM MISC 1 applicator by Does not apply route at  bedtime. 100 each 2  . metFORMIN (GLUCOPHAGE) 1000 MG tablet Take 1 tablet (1,000 mg total) by mouth 2 (two) times daily with a meal. 180 tablet 3  . metoprolol tartrate (LOPRESSOR) 25 MG tablet TAKE 1 & 1/2 TABLET BY MOUTH 2 TIMES DAILY 270 tablet 2  . omeprazole (PRILOSEC) 40 MG capsule Take 1 capsule (40 mg total) by mouth daily.    Glory Rosebush DELICA LANCETS 58K MISC Use as instructed to check blood sugar up to 3 times daily. 100 each 11  . XARELTO 20 MG TABS tablet TAKE 1 TABLET BY MOUTH DAILY WITH SUPPER. 30 tablet 5  . betamethasone dipropionate 0.05 % cream APPLY 1 APPLICATION OF CREAM TWICE DAILY AS NEEDED FOR FLARES    . metoCLOPramide (REGLAN) 5 MG tablet Take 1 tab by mouth  30 min before meals as needed. (Patient not taking: Reported on 09/08/2019) 90 tablet 5  . metroNIDAZOLE (METROCREAM) 0.75 % cream APPLY THIN LAYER TO FACE TWICE DAILY    . SKYRIZI, 150 MG DOSE, 75 MG/0.83ML PSKT     . triamcinolone cream (KENALOG) 0.1 % Apply 1 application topically 2 (two) times daily as needed (rash). (Patient not taking: Reported on 09/08/2019) 45 g 1   No current facility-administered medications on file prior to visit.     Allergies  Allergen Reactions  . Codeine Itching and Rash    Social History   Socioeconomic History  . Marital status: Divorced    Spouse name: Not on file  . Number of children: 0  . Years of education: 9  . Highest education level: Not on file  Occupational History  . Occupation: Chief Technology Officer  Social Needs  . Financial resource strain: Not on file  . Food insecurity    Worry: Not on file    Inability: Not on file  . Transportation needs    Medical: Not on file    Non-medical: Not on file  Tobacco Use  . Smoking status: Former Smoker    Packs/day: 1.00    Years: 22.00    Pack years: 22.00    Types: Cigarettes    Quit date: 12/01/2010    Years since quitting: 8.7  . Smokeless tobacco: Never Used  Substance and Sexual Activity  . Alcohol use: Yes     Comment: 06/30/2017 "glass of wine maybe q other month"  . Drug use: No  . Sexual activity: Not Currently  Lifestyle  . Physical activity    Days per week: Not on file    Minutes per session: Not on file  . Stress: Not on file  Relationships  . Social Herbalist on phone: Not on file    Gets together: Not on file    Attends religious service: Not on file    Active member of club  or organization: Not on file    Attends meetings of clubs or organizations: Not on file    Relationship status: Not on file  . Intimate partner violence    Fear of current or ex partner: Not on file    Emotionally abused: Not on file    Physically abused: Not on file    Forced sexual activity: Not on file  Other Topics Concern  . Not on file  Social History Narrative   Moved to [place] from [place] in 2002.     Fun: shop, sew, decorate   Denies any beliefs effecting healthcare    Family History  Adopted: Yes  Problem Relation Age of Onset  . Hypertension Mother   . Hyperlipidemia Mother     Past Surgical History:  Procedure Laterality Date  . ATRIAL FIBRILLATION ABLATION  06/30/2017  . ATRIAL FIBRILLATION ABLATION N/A 06/30/2017   Procedure: Atrial Fibrillation Ablation;  Surgeon: Thompson Grayer, MD;  Location: [place] [facility];  Service: Cardiovascular;  Laterality: N/A;  . BIOPSY THYROID  1996  . CARDIOVERSION N/A 12/07/2013   Procedure: CARDIOVERSION;  Surgeon: Casandra Doffing, MD;  Location: [place];  Service: Cardiovascular;  Laterality: N/A;  . CARDIOVERSION N/A 03/10/2016   Procedure: CARDIOVERSION;  Surgeon: Thayer Headings, MD;  Location: [facility] ENDOSCOPY;  Service: Cardiovascular;  Laterality: N/A;  . ESOPHAGOGASTRODUODENOSCOPY N/A 12/07/2017   Procedure: ESOPHAGOGASTRODUODENOSCOPY (EGD);  Surgeon: Irene Shipper, MD;  Location: [facility] ENDOSCOPY;  Service: Endoscopy;  Laterality: N/A;  . EYE MUSCLE SURGERY Right ~ 1966  . TEE WITHOUT CARDIOVERSION N/A 06/29/2017   Procedure:  TRANSESOPHAGEAL ECHOCARDIOGRAM (TEE);  Surgeon: Fay Records, MD;  Location: [facility] ENDOSCOPY;  Service: Cardiovascular;  Laterality: N/A;  . TONSILLECTOMY  1971    ROS: Review of Systems Negative except as stated above  PHYSICAL EXAM: BP 111/79 (BP Location: Left Arm, Patient Position: Sitting, Cuff Size: Large)   Pulse 68   Temp 98.1 F (36.7 C) (Oral)   Resp 18   Ht 5' 5.5" (1.664 m)   Wt 249 lb (112.9 kg)   LMP 04/15/2015 (LMP Unknown) Comment: last in may  SpO2 97%   BMI 40.81 kg/m   Wt Readings from Last 3 Encounters:  09/08/19 249 lb (112.9 kg)  03/09/19 248 lb (112.5 kg)  02/23/19 249 lb 3.2 oz (113 kg)    Physical Exam  General appearance - alert, well appearing, and in no distress Mental status - normal mood, behavior, speech, dress, motor activity, and thought processes Mouth - mucous membranes moist, pharynx normal without lesions Neck - supple, no significant adenopathy Chest - clear to auscultation, no wheezes, rales or rhonchi, symmetric air entry Heart - normal rate, regular rhythm, normal S1, S2, no murmurs, rubs, clicks or gallops Extremities - peripheral pulses normal, no pedal edema, no clubbing or cyanosis Diabetic Foot Exam - Simple   Simple Foot Form Visual Inspection No deformities, no ulcerations, no other skin breakdown bilaterally: Yes Sensation Testing See comments: Yes Pulse Check Posterior Tibialis and Dorsalis pulse intact bilaterally: Yes Comments Some decrease sensation on the plantar surface of both feet      CMP Latest Ref Rng & Units 08/09/2018 12/09/2017 12/08/2017  Glucose 65 - 99 mg/dL 185(H) 174(H) 193(H)  BUN 6 - 24 mg/dL '16 10 8  ' Creatinine 0.57 - 1.00 mg/dL 0.69 0.59 0.74  Sodium 134 - 144 mmol/L 137 135 135  Potassium 3.5 - 5.2 mmol/L 4.8 3.6 3.7  Chloride 96 - 106 mmol/L  102 105 103  CO2 20 - 29 mmol/L 20 19(L) 21(L)  Calcium 8.7 - 10.2 mg/dL 9.5 8.7(L) 8.5(L)  Total Protein 6.0 - 8.5 g/dL 7.7 - -  Total Bilirubin 0.0 -  1.2 mg/dL 0.4 - -  Alkaline Phos 39 - 117 IU/L 72 - -  AST 0 - 40 IU/L 12 - -  ALT 0 - 32 IU/L 22 - -   Lipid Panel     Component Value Date/Time   CHOL 176 08/09/2018 0941   TRIG 228 (H) 08/09/2018 0941   HDL 40 08/09/2018 0941   CHOLHDL 4.4 08/09/2018 0941   CHOLHDL 5.2 (H) 11/19/2016 1103   VLDL 59 (H) 11/19/2016 1103   LDLCALC 90 08/09/2018 0941   LDLDIRECT 114.0 02/13/2015 0750    CBC    Component Value Date/Time   WBC 5.4 12/09/2017 0520   RBC 4.56 12/09/2017 0520   HGB 14.3 12/09/2017 0520   HGB 15.4 11/25/2017 1017   HCT 43.3 12/09/2017 0520   HCT 46.2 11/25/2017 1017   PLT 200 12/09/2017 0520   PLT 272 11/25/2017 1017   MCV 95.0 12/09/2017 0520   MCV 96 11/25/2017 1017   MCH 31.4 12/09/2017 0520   MCHC 33.0 12/09/2017 0520   RDW 12.8 12/09/2017 0520   RDW 13.7 11/25/2017 1017   LYMPHSABS 1.7 11/25/2017 1017   MONOABS 704 03/07/2016 0904   EOSABS 0.3 11/25/2017 1017   BASOSABS 0.1 11/25/2017 1017    ASSESSMENT AND PLAN: 1. Type 2 diabetes mellitus with diabetic polyneuropathy, with long-term current use of insulin (HCC) Not at goal.  Increase Lantus to 80 units daily.  Encourage healthy eating habits.  Encouraged her to try to exercise more at least 3 to 4 days a week for 30 minutes.  Will refer her to endocrinology to help with management given that she is on maximum dose of her oral medications and on high dose of Lantus and still not optimally controlled. GLP1 not an option due to gastroparesis - HgB A1c - Glucose (CBG) - Ambulatory referral to Endocrinology - Insulin Glargine (LANTUS SOLOSTAR) 100 UNIT/ML Solostar Pen; Inject 80 Units into the skin at bedtime.  Dispense: 15 mL; Refill: 5 - Ambulatory referral to Ophthalmology - empagliflozin (JARDIANCE) 10 MG TABS tablet; Take 10 mg by mouth daily. Must have office visit for refills  Dispense: 30 tablet; Refill: 0 - Amb ref to Medical Nutrition Therapy-MNT - CBC - Comprehensive metabolic panel - Lipid  panel - Vitamin B12  2. Essential hypertension At goal.  Continue current medications and low-salt diet - lisinopril (ZESTRIL) 10 MG tablet; Take 0.5 tablets (5 mg total) by mouth daily. Must have office visit for refills  Dispense: 15 tablet; Refill: 0  3. Hyperlipidemia associated with type 2 diabetes mellitus (HCC) Continue atorvastatin  4. Class 3 severe obesity with serious comorbidity and body mass index (BMI) of 40.0 to 44.9 in adult, unspecified obesity type ([place]) See #1 above  5. History of atrial flutter On auscultation she sounds to be in sinus rhythm.  She is on Xarelto with no complications  6. Nonischemic cardiomyopathy (HCC) Clinically stable.  She has follow-up with the cardiologist next month  7. Need for influenza vaccination Given  8. Encounter for screening mammogram for malignant neoplasm of breast - MM Digital Screening; Future  9. Polyarthralgia - DULoxetine (CYMBALTA) 20 MG capsule; Take 1 capsule (20 mg total) by mouth daily.  Dispense: 30 capsule; Refill: 2 - diclofenac sodium (VOLTAREN) 1 % GEL;  APPLY 2 GRAMS TOPICALLY 4 (FOUR) TIMES DAILY AS NEEDED (PAIN).  Dispense: 100 g; Refill: 5  10. Gastroesophageal reflux disease without esophagitis GERD precautions discussed and encouraged.  Advised of foods to avoid.  Encouraged her to eat her last meal at least 2 to 3 hours before laying down at night and sleep with her head a little elevated.     Patient was given the opportunity to ask questions.  Patient verbalized understanding of the plan and was able to repeat key elements of the plan.   Orders Placed This Encounter  Procedures  . MM Digital Screening  . CBC  . Comprehensive metabolic panel  . Lipid panel  . Vitamin B12  . Ambulatory referral to Endocrinology  . Ambulatory referral to Ophthalmology  . Amb ref to Medical Nutrition Therapy-MNT  . HgB A1c  . Glucose (CBG)     Requested Prescriptions   Signed Prescriptions Disp Refills  .  Insulin Glargine (LANTUS SOLOSTAR) 100 UNIT/ML Solostar Pen 15 mL 5    Sig: Inject 80 Units into the skin at bedtime.  Marland Kitchen lisinopril (ZESTRIL) 10 MG tablet 15 tablet 0    Sig: Take 0.5 tablets (5 mg total) by mouth daily. Must have office visit for refills  . empagliflozin (JARDIANCE) 10 MG TABS tablet 30 tablet 0    Sig: Take 10 mg by mouth daily. Must have office visit for refills  . DULoxetine (CYMBALTA) 20 MG capsule 30 capsule 2    Sig: Take 1 capsule (20 mg total) by mouth daily.  . diclofenac sodium (VOLTAREN) 1 % GEL 100 g 5    Sig: APPLY 2 GRAMS TOPICALLY 4 (FOUR) TIMES DAILY AS NEEDED (PAIN).    Return in about 3 months (around 12/09/2019).  Karle Plumber, MD, FACP

## 2019-09-12 LAB — COMPREHENSIVE METABOLIC PANEL
ALT: 22 IU/L (ref 0–32)
AST: 11 IU/L (ref 0–40)
Albumin/Globulin Ratio: 1.1 — ABNORMAL LOW (ref 1.2–2.2)
Albumin: 4 g/dL (ref 3.8–4.9)
Alkaline Phosphatase: 90 IU/L (ref 39–117)
BUN/Creatinine Ratio: 19 (ref 9–23)
BUN: 16 mg/dL (ref 6–24)
Bilirubin Total: 0.3 mg/dL (ref 0.0–1.2)
CO2: 20 mmol/L (ref 20–29)
Calcium: 9.5 mg/dL (ref 8.7–10.2)
Chloride: 102 mmol/L (ref 96–106)
Creatinine, Ser: 0.84 mg/dL (ref 0.57–1.00)
GFR calc Af Amer: 90 mL/min/{1.73_m2} (ref >59)
GFR calc non Af Amer: 78 mL/min/{1.73_m2} (ref >59)
Globulin, Total: 3.6 g/dL (ref 1.5–4.5)
Glucose: 183 mg/dL — ABNORMAL HIGH (ref 65–99)
Potassium: 4.6 mmol/L (ref 3.5–5.2)
Sodium: 136 mmol/L (ref 134–144)
Total Protein: 7.6 g/dL (ref 6.0–8.5)

## 2019-09-12 LAB — CBC
Hematocrit: 46.3 % (ref 34.0–46.6)
Hemoglobin: 15.6 g/dL (ref 11.1–15.9)
MCH: 32 pg (ref 26.6–33.0)
MCHC: 33.7 g/dL (ref 31.5–35.7)
MCV: 95 fL (ref 79–97)
Platelets: 284 10*3/uL (ref 150–450)
RBC: 4.88 x10E6/uL (ref 3.77–5.28)
RDW: 12.5 % (ref 11.7–15.4)
WBC: 9.2 10*3/uL (ref 3.4–10.8)

## 2019-09-12 LAB — LIPID PANEL
Chol/HDL Ratio: 4.8 ratio — ABNORMAL HIGH (ref 0.0–4.4)
Cholesterol, Total: 167 mg/dL (ref 100–199)
HDL: 35 mg/dL — ABNORMAL LOW (ref >39)
LDL Chol Calc (NIH): 75 mg/dL (ref 0–99)
Triglycerides: 353 mg/dL — ABNORMAL HIGH (ref 0–149)
VLDL Cholesterol Cal: 57 mg/dL — ABNORMAL HIGH (ref 5–40)

## 2019-09-12 LAB — VITAMIN B12: Vitamin B-12: 360 pg/mL (ref 232–1245)

## 2019-09-13 MED FILL — XARELTO 20 MG TABLET: 20 | 30 days supply | Qty: 30 | Fill #5

## 2019-09-13 MED FILL — ATORVASTATIN CALCIUM 40 MG: 40 | 30 days supply | Qty: 30 | Fill #1

## 2019-09-13 MED FILL — metFORMIN HCL 1000 MG TABS: 1000 | 30 days supply | Qty: 60 | Fill #5

## 2019-09-13 MED FILL — glipiZIDE 10 MG TABS: 10 | 30 days supply | Qty: 60 | Fill #5

## 2019-09-15 ENCOUNTER — Other Ambulatory Visit: Payer: Self-pay

## 2019-09-16 ENCOUNTER — Encounter: Payer: Self-pay | Admitting: Endocrinology

## 2019-09-16 ENCOUNTER — Ambulatory Visit (INDEPENDENT_AMBULATORY_CARE_PROVIDER_SITE_OTHER): Payer: BC Managed Care – PPO | Admitting: Endocrinology

## 2019-09-16 DIAGNOSIS — Z794 Long term (current) use of insulin: Secondary | ICD-10-CM | POA: Diagnosis not present

## 2019-09-16 DIAGNOSIS — E042 Nontoxic multinodular goiter: Secondary | ICD-10-CM | POA: Insufficient documentation

## 2019-09-16 DIAGNOSIS — E1142 Type 2 diabetes mellitus with diabetic polyneuropathy: Secondary | ICD-10-CM

## 2019-09-16 LAB — TSH: TSH: 1.01 u[IU]/mL (ref 0.35–4.50)

## 2019-09-16 MED ORDER — OZEMPIC (0.25 OR 0.5 MG/DOSE) 2 MG/1.5ML ~~LOC~~ SOPN: 0.2500 mg | PEN_INJECTOR | SUBCUTANEOUS | 11 refills | Status: DC

## 2019-09-16 MED ORDER — LANTUS SOLOSTAR 100 UNIT/ML ~~LOC~~ SOPN: 80.0000 [IU] | PEN_INJECTOR | SUBCUTANEOUS | 5 refills | Status: DC

## 2019-09-16 MED ORDER — JARDIANCE 25 MG PO TABS: 25.0000 mg | ORAL_TABLET | Freq: Every day | ORAL | 3 refills | Status: DC

## 2019-09-16 MED FILL — JARDIANCE 25 MG TABLET: 25 | 90 days supply | Qty: 90 | Fill #0

## 2019-09-16 MED FILL — OZEMPIC 0.25 OR 0.5 MG/DOSE: 2 | 56 days supply | Qty: 2 | Fill #0

## 2019-09-16 NOTE — Patient Instructions (Addendum)
Blood tests are requested for you today.  We'll let you know about the results.  most of the time, a "lumpy thyroid" will eventually become overactive.  this is usually a slow process, happening over the span of many years. good diet and exercise significantly improve the control of your diabetes.  please let me know if you wish to be referred to a dietician.  high blood sugar is very risky to your health.  you should see an eye doctor and dentist every year.  It is very important to get all recommended vaccinations.  Controlling your blood pressure and cholesterol drastically reduces the damage diabetes does to your body.  Those who smoke should quit.  Please discuss these with your doctor.  check your blood sugar twice a day.  vary the time of day when you check, between before the 3 meals, and at bedtime.  also check if you have symptoms of your blood sugar being too high or too low.  please keep a record of the readings and bring it to your next appointment here (or you can bring the meter itself).  You can write it on any piece of paper.  please call us sooner if your blood sugar goes below 70, or if you have a lot of readings over 200. Please change the Lantus to the morning, and:   I have sent a prescription to your pharmacy, to increase Jardiance and add Ozempic.   Please call when you have no nausea, so we can increase the Ozempic.   Please continue the same other diabetes medications.   Please come back for a follow-up appointment in 1 month.     Bariatric Surgery You have so much to gain by losing weight.  You may have already tried every diet and exercise plan imaginable.  And, you may have sought advice from your family physician, too.   Sometimes, in spite of such diligent efforts, you may not be able to achieve long-term results by yourself.  In cases of severe obesity, bariatric or weight loss surgery is a proven method of achieving long-term weight control.  Our Services Our  bariatric surgery programs offer our patients new hope and long-term weight-loss solution.  Since introducing our services in 2003, we have conducted more than 2,400 successful procedures.  Our program is designated as a Investment banker, corporate by the Metabolic and Bariatric Surgery Accreditation and Quality Improvement Program (MBSAQIP), a Child psychotherapist that sets rigorous patient safety and outcome standards.  Our program is also designated as a Engineer, manufacturing systems by [employer].   Our exceptional weight-loss surgery team specializes in diagnosis, treatment, follow-up care, and ongoing support for our patients with severe weight loss challenges.  We currently offer laparoscopic sleeve gastrectomy, gastric bypass, and adjustable gastric band (LAP-BAND).    Attend our Bariatrics Seminar Choosing to undergo a bariatric procedure is a big decision, and one that should not be taken lightly.  You now have two options in how you learn about weight-loss surgery - in person or online.  Our objective is to ensure you have all of the information that you need to evaluate the advantages and obligations of this life changing procedure.  Please note that you are not alone in this process, and our experienced team is ready to assist and answer all of your questions.  There are several ways to register for a seminar (either on-line or in person): 1)  Call [phone] 2) Go on-line to [facility] and  register for either type of seminar.  [email or web address]

## 2019-09-16 NOTE — Progress Notes (Signed)
Subjective:    Patient ID: Kelly Lang, female    DOB: 07-17-1961, 58 y.o.   MRN: [id number]  HPI pt is referred by Dr Wynetta Emery, for diabetes.  Pt states DM was dx'ed in 2016; she has mild neuropathy of the lower extremities, and associated gastroparesis and CVA; she has been on insulin since 2018; pt says her diet and exercise are fair; she has never had GDM, pancreatitis, pancreatic surgery, severe hypoglycemia or DKA.  She takes Lantus 80/d, and 3 oral meds. She says cbg varies from 158-284.  There is no trend throughout the day.   She had right thyroid bx in 1996.  She says it was benign.   Past Medical History:  Diagnosis Date  . Arthritis    "all over" (06/30/2017)  . Chicken pox   . Dyslipidemia   . H/O: hypothyroidism    a. thyroid biopsy 1996 with synthroid. Stopped taking rx and was loss to follow up b. normal thyroid function 10/20/13  . High cholesterol   . Hx of cardiovascular stress test    ETT/Lexiscan Myoview (12/2013):  No ischemia; not gated; low risk.  Marland Kitchen Hypertension   . Obesity   . Persistent atrial fibrillation ([place])    a diagnosed 10/25/2013  . Seasonal allergies   . Stroke [facility]) 2016   "some speech problems since" (06/30/2017)  . 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 2/2 Afib with RVR for unknown duration (EF: 40-45%)  . Thyroid disease   . Type II diabetes mellitus ([place])    a. hg A1c 10, newly diagnosed (10/26/13)    Past Surgical History:  Procedure Laterality Date  . ATRIAL FIBRILLATION ABLATION  06/30/2017  . ATRIAL FIBRILLATION ABLATION N/A 06/30/2017   Procedure: Atrial Fibrillation Ablation;  Surgeon: Thompson Grayer, MD;  Location: [place] CV LAB;  Service: Cardiovascular;  Laterality: N/A;  . BIOPSY THYROID  1996  . CARDIOVERSION N/A 12/07/2013   Procedure: CARDIOVERSION;  Surgeon: Casandra Doffing, MD;  Location: [place];  Service: Cardiovascular;  Laterality: N/A;  . CARDIOVERSION N/A 03/10/2016   Procedure: CARDIOVERSION;  Surgeon:  Thayer Headings, MD;  Location: [facility] ENDOSCOPY;  Service: Cardiovascular;  Laterality: N/A;  . ESOPHAGOGASTRODUODENOSCOPY N/A 12/07/2017   Procedure: ESOPHAGOGASTRODUODENOSCOPY (EGD);  Surgeon: Irene Shipper, MD;  [facility] ENDOSCOPY;  Service: Endoscopy;  Laterality: N/A;  . EYE MUSCLE SURGERY Right ~ 1966  . TEE WITHOUT CARDIOVERSION N/A 06/29/2017   Procedure: TRANSESOPHAGEAL ECHOCARDIOGRAM (TEE);  Surgeon: Fay Records, MD;  Location: [place];  Service: Cardiovascular;  Laterality: N/A;  . TONSILLECTOMY  1971    Social History   Socioeconomic History  . Marital status: Divorced    Spouse name: Not on file  . Number of children: 0  . Years of education: 63  . Highest education level: Not on file  Occupational History  . Occupation: Chief Technology Officer  Social Needs  . Financial resource strain: Not on file  . Food insecurity    Worry: Not on file    Inability: Not on file  . Transportation needs    Medical: Not on file    Non-medical: Not on file  Tobacco Use  . Smoking status: Former Smoker    Packs/day: 1.00    Years: 22.00    Pack years: 22.00    Types: Cigarettes    Quit date: 12/01/2010    Years since quitting: 8.8  . Smokeless tobacco: Never Used  Substance and Sexual Activity  . Alcohol use: Yes  Comment: 06/30/2017 "glass of wine maybe q other month"  . Drug use: No  . Sexual activity: Not Currently  Lifestyle  . Physical activity    Days per week: Not on file    Minutes per session: Not on file  . Stress: Not on file  Relationships  . Social Herbalist on phone: Not on file    Gets together: Not on file    Attends religious service: Not on file    Active member of club or organization: Not on file    Attends meetings of clubs or organizations: Not on file    Relationship status: Not on file  . Intimate partner violence    Fear of current or ex partner: Not on file    Emotionally abused: Not on file    Physically abused: Not on file     Forced sexual activity: Not on file  Other Topics Concern  . Not on file  Social History Narrative   Moved to [place] from [place] in 2002.     Fun: shop, sew, decorate   Denies any beliefs effecting healthcare    Current Outpatient Medications on File Prior to Visit  Medication Sig Dispense Refill  . atorvastatin (LIPITOR) 40 MG tablet Take 1 tablet (40 mg total) by mouth daily. 30 tablet 2  . betamethasone dipropionate 0.05 % cream APPLY 1 APPLICATION OF CREAM TWICE DAILY AS NEEDED FOR FLARES    . Blood Glucose Monitoring Suppl ([place]) w/Device KIT Use as instructed to check blood sugar up to 3 times daily. (Patient taking differently: 1 each by Other route 2 (two) times daily. ) 1 kit 0  . diclofenac sodium (VOLTAREN) 1 % GEL APPLY 2 GRAMS TOPICALLY 4 (FOUR) TIMES DAILY AS NEEDED (PAIN). 100 g 5  . DULoxetine (CYMBALTA) 20 MG capsule Take 1 capsule (20 mg total) by mouth daily. 30 capsule 2  . glipiZIDE (GLUCOTROL) 10 MG tablet Take 1 tablet (10 mg total) by mouth 2 (two) times daily before a meal. 60 tablet 12  . glucose blood (ONETOUCH VERIO) test strip Use as instructed to check blood sugar up to 3 times daily. (Patient taking differently: 1 each by Other route 2 (two) times daily. ) 100 each 11  . Insulin Pen Needle (ULTICARE MICRO PEN NEEDLES) 32G X 4 MM MISC 1 applicator by Does not apply route at bedtime. 100 each 2  . lisinopril (ZESTRIL) 10 MG tablet Take 0.5 tablets (5 mg total) by mouth daily. Must have office visit for refills (Patient taking differently: Take 5 mg by mouth daily. ) 15 tablet 0  . metFORMIN (GLUCOPHAGE) 1000 MG tablet Take 1 tablet (1,000 mg total) by mouth 2 (two) times daily with a meal. 180 tablet 3  . metoCLOPramide (REGLAN) 5 MG tablet Take 1 tab by mouth  30 min before meals as needed. 90 tablet 5  . metoprolol tartrate (LOPRESSOR) 25 MG tablet TAKE 1 & 1/2 TABLET BY MOUTH 2 TIMES DAILY 270 tablet 2  . metroNIDAZOLE (METROCREAM)  0.75 % cream APPLY THIN LAYER TO FACE TWICE DAILY    . omeprazole (PRILOSEC) 40 MG capsule Take 1 capsule (40 mg total) by mouth daily.    Glory Rosebush DELICA LANCETS 02V MISC Use as instructed to check blood sugar up to 3 times daily. (Patient taking differently: 1 each by Other route 2 (two) times daily. ) 100 each 11  . SKYRIZI, 150 MG DOSE, 75 MG/0.83ML PSKT  Inject 150 mg as directed See admin instructions. 4x's per year    . XARELTO 20 MG TABS tablet TAKE 1 TABLET BY MOUTH DAILY WITH SUPPER. 30 tablet 5   No current facility-administered medications on file prior to visit.     Allergies  Allergen Reactions  . Codeine Itching and Rash    Family History  Adopted: Yes  Problem Relation Age of Onset  . Hypertension Mother   . Hyperlipidemia Mother     BP 110/68 (BP Location: Left Arm, Patient Position: Sitting, Cuff Size: Large)   Pulse 89   Ht 5' 5.5" (1.664 m)   Wt 250 lb 12.8 oz (113.8 kg)   LMP 04/15/2015 (LMP Unknown) Comment: last in may  SpO2 95%   BMI 41.10 kg/m    Review of Systems denies blurry vision, headache, chest pain, sob, n/v, urinary frequency, muscle cramps, excessive diaphoresis, memory loss, depression, cold intolerance, rhinorrhea, and easy bruising.  She has chronic weight gain     Objective:   Physical Exam VS: see vs page GEN: no distress HEAD: head: no deformity eyes: no periorbital swelling, no proptosis external nose and ears are normal NECK: approx 5 cm right thyroid mass CHEST WALL: no deformity LUNGS: clear to auscultation CV: reg rate and rhythm, no murmur ABD: abdomen is soft, nontender.  no hepatosplenomegaly.  not distended.  no hernia MUSCULOSKELETAL: muscle bulk and strength are grossly normal.  no obvious joint swelling.  gait is normal and steady EXTEMITIES: no deformity.  no ulcer on the feet.  feet are of normal color and temp.  no edema PULSES: dorsalis pedis intact bilat.  no carotid bruit NEURO:  cn 2-12 grossly intact.    readily moves all 4's.  sensation is intact to touch on the feet SKIN:  Normal texture and temperature.  No rash or suspicious lesion is visible.   NODES:  None palpable at the neck PSYCH: alert, well-oriented.  Does not appear anxious nor depressed.    Lab Results  Component Value Date   HGBA1C 9.2 (A) 09/08/2019    Lab Results  Component Value Date   TSH 0.718 12/26/2015   [place]: Bilateral nodules as described. The dominant nodule in the right lobe measures 4.3 cm and previously measured up to 6.0 cm.  Lab Results  Component Value Date   CREATININE 0.84 09/08/2019   BUN 16 09/08/2019   NA 136 09/08/2019   K 4.6 09/08/2019   CL 102 09/08/2019   CO2 20 09/08/2019   I have reviewed outside records, and summarized: Pt was noted to have nodular goiter, and referred here.  She was recently seen in ER with GI sxs     Assessment & Plan:  Insulin-requiring type 2 DM, with CVA: she needs increased rx Obesity: new to me.  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  However, she will prob evolve hyperthyroidism, with time.    Patient Instructions  Blood tests are requested for you today.  We'll let you know about the results.  most of the time, a "lumpy thyroid" will eventually become overactive.  this is usually a slow process, happening over the span of many years. good diet and exercise significantly improve the control of your diabetes.  please let me know if you wish to be referred to a dietician.  high blood sugar is very risky to your health.  you should see an eye doctor and dentist every year.  It is very important to get all  recommended vaccinations.  Controlling your blood pressure and cholesterol drastically reduces the damage diabetes does to your body.  Those who smoke should quit.  Please discuss these with your doctor.  check your blood sugar twice a day.  vary the time of day when you check, between before the 3 meals, and at bedtime.  also check if you have symptoms  of your blood sugar being too high or too low.  please keep a record of the readings and bring it to your next appointment here (or you can bring the meter itself).  You can write it on any piece of paper.  please call us sooner if your blood sugar goes below 70, or if you have a lot of readings over 200. Please change the Lantus to the morning, and:   I have sent a prescription to your pharmacy, to increase Jardiance and add Ozempic.   Please call when you have no nausea, so we can increase the Ozempic.   Please continue the same other diabetes medications.   Please come back for a follow-up appointment in 1 month.     Bariatric Surgery You have so much to gain by losing weight.  You may have already tried every diet and exercise plan imaginable.  And, you may have sought advice from your family physician, too.   Sometimes, in spite of such diligent efforts, you may not be able to achieve long-term results by yourself.  In cases of severe obesity, bariatric or weight loss surgery is a proven method of achieving long-term weight control.  Our Services Our bariatric surgery programs offer our patients new hope and long-term weight-loss solution.  Since introducing our services in 2003, we have conducted more than 2,400 successful procedures.  Our program is designated as a Programmer, multimedia by the Metabolic and Bariatric Surgery Accreditation and Quality Improvement Program (MBSAQIP), a IT trainer that sets rigorous patient safety and outcome standards.  Our program is also designated as a Ecologist by [employer].   Our exceptional weight-loss surgery team specializes in diagnosis, treatment, follow-up care, and ongoing support for our patients with severe weight loss challenges.  We currently offer laparoscopic sleeve gastrectomy, gastric bypass, and adjustable gastric band (LAP-BAND).    Attend our [place] Choosing to undergo a bariatric  procedure is a big decision, and one that should not be taken lightly.  You now have two options in how you learn about weight-loss surgery - in person or online.  Our objective is to ensure you have all of the information that you need to evaluate the advantages and obligations of this life changing procedure.  Please note that you are not alone in this process, and our experienced team is ready to assist and answer all of your questions.  There are several ways to register for a seminar (either on-line or in person): 1)  Call [phone] 2) Go on-line to [facility] and register for either type of seminar.  [email or web address]

## 2019-09-21 DIAGNOSIS — H52223 Regular astigmatism, bilateral: Secondary | ICD-10-CM | POA: Diagnosis not present

## 2019-09-21 DIAGNOSIS — D3131 Benign neoplasm of right choroid: Secondary | ICD-10-CM | POA: Diagnosis not present

## 2019-09-21 DIAGNOSIS — H524 Presbyopia: Secondary | ICD-10-CM | POA: Diagnosis not present

## 2019-09-21 DIAGNOSIS — H5203 Hypermetropia, bilateral: Secondary | ICD-10-CM | POA: Diagnosis not present

## 2019-09-21 DIAGNOSIS — H0288A Meibomian gland dysfunction right eye, upper and lower eyelids: Secondary | ICD-10-CM | POA: Diagnosis not present

## 2019-09-21 DIAGNOSIS — H0288B Meibomian gland dysfunction left eye, upper and lower eyelids: Secondary | ICD-10-CM | POA: Diagnosis not present

## 2019-09-21 DIAGNOSIS — H1045 Other chronic allergic conjunctivitis: Secondary | ICD-10-CM | POA: Diagnosis not present

## 2019-09-21 LAB — HM DIABETES EYE EXAM

## 2019-10-04 NOTE — Progress Notes (Signed)
Cardiology Office Note   Date:  10/05/2019   ID:  Kelly Lang, DOB 20-Jan-1961, MRN [id number]  PCP:  Ladell Pier, MD  Cardiologist:   Mertie Moores, MD   Chief Complaint  Patient presents with  . Atrial Fibrillation   1. Paroxysmal Atrial fibrillation 2. Hypothyroidism 3. Type 2 diabetes mellitus 4. Chronic systolic CHF    Kelly Lang is a 58 y.o. female with a hx of T2DM, HL, thyroid disease and atrial fibrillation. She had been on Synthroid for some time after a thyroid bx in 1996. She was admitted 10/2013 with AFib with RVR. Echo (10/26/13): EF 40-45%, normal wall motion, mild LAE. CHADS2-VASc= 3 (LV dysfunction, DM, female). She was placed on coumadin. 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 She was last seen 11/29/2013. She was set up for DCCV after 3-4 weeks of appropriate anticoagulation. DCCV was performed 12/07/2013 but was unsuccessful. She returns for follow up.  She is stable. She is frustrated with her atrial fibrillation. She does get tired more easily. She denies chest pain. She denies significant dyspnea. She is NYHA class II. She denies syncope. She denies orthopnea, PND, edema, palpitations.  Feb. 16, 2015:  She had a cardioversion twice. Converted back to Afib both times. She has had a stress myoview which was negative for ischemia. we will start flecainide today.  February 15, 2014:  Kelly Lang returns for further evaluation of her atrial fib. She has been on Flecainide 100 BID for a month. She started feeling better about 2 weeks ago. She has converted to NSR. 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   Sept. 16, 2015:  Kelly Lang is doing well. 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    February 13, 2015:  Kelly Lang is a 58 y.o. female who presents for follow-up of atrial fibrillation. Is on coumadin . INR is 2.0 this am.   No CP or dyspnea.    Glucose levels are ok.   HbA1C is a bit high . Was started on Tragenta. Only a little bit of exercise.   Missed 4 days of flecainide due to insurance not paying for it. Staying in NSR  Nov. 29, 2016:  Had a CVA in Oct, 2016.  Had not been on her coumadin and all of her medications.  Echo revealed severe LV dysfunction with EF of 20-25%.   Was started on Xarelto and metoprolol at that time   Has had some some chills and achy feeling for the past couple of days. No cough, no fever, no runny nose.  Cannot tell when she has atrial fib Not Exercising regularly  Works in [employer]	[place] of a hotel and also in [employer] room  Glucose levels are ok  Sleeping ok .     February 08, 2016:  Kelly Lang has been back on her meds.   She had stopped her medications and had developed worsening congestive heart failure. Previous echocardiogram revealed an EF of 25%. She restart her medications and the recent echo 2 days ago reveals recovery of her left ventricular systolic function to an ejection fraction 45% which is her normal.  She's feeling quite a bit better. She has taken flecainide in the past which helped her stay in normal sinus rhythm. She stopped the flecainide due to lack of insurance. She now has [employer] and wants to get back on flecainide.  March 07, 2016:  Kelly Lang is seen back today for follow-up of her atrial fibrillation. She's been on flecainide 100 mg twice a day.  She's had her stress test for flecainide testing and it revealed no QRS widening. She had an unsuccessful cardioversion Jan. 7, 2015 She cannot tell how much she is in NSR vs. Afib.   Oct. 9, 2017:  She is s/p cardioversion in April She is feeling better.  Able to walk without any dyspnea   May 18, 2017:  Kelly Lang is seen back today for follow up  No CP or dyspnea  Is now on Jardiance for the past week   Apr 12, 2018  Kelly Lang is seen for follow up visit  Has had an ablation since I last  Feels so much  better.    Able to walk but not getting any regular exercise  Still on Xarelto.  Body mass index is 39.88 kg/m.  October 05, 2019: Kelly Lang is seen today for follow-up visit.  He has a history of paroxysmal atrial fibrillation, obesity, hyperlipidemia and diabetes mellitus. Is not exercising much .  Working on getting diabetes regulated   Wt is 250 ( up 6 lbs)  Is s/p AFIB ablation    Past Medical History:  Diagnosis Date  . Arthritis    "all over" (06/30/2017)  . Chicken pox   . Dyslipidemia   . H/O: hypothyroidism    a. thyroid biopsy 1996 with synthroid. Stopped taking rx and was loss to follow up b. normal thyroid function 10/20/13  . High cholesterol   . Hx of cardiovascular stress test    ETT/Lexiscan Myoview (12/2013):  No ischemia; not gated; low risk.  Marland Kitchen Hypertension   . Obesity   . Persistent atrial fibrillation ([place])    a diagnosed 10/25/2013  . Seasonal allergies   . Stroke [facility]) 2016   "some speech problems since" (06/30/2017)  . 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 2/2 Afib with RVR for unknown duration (EF: 40-45%)  . Thyroid disease   . Type II diabetes mellitus ([place])    a. hg A1c 10, newly diagnosed (10/26/13)    Past Surgical History:  Procedure Laterality Date  . ATRIAL FIBRILLATION ABLATION  06/30/2017  . ATRIAL FIBRILLATION ABLATION N/A 06/30/2017   Procedure: Atrial Fibrillation Ablation;  Surgeon: Thompson Grayer, MD;  Location: [place] CV LAB;  Service: Cardiovascular;  Laterality: N/A;  . BIOPSY THYROID  1996  . CARDIOVERSION N/A 12/07/2013   Procedure: CARDIOVERSION;  Surgeon: Casandra Doffing, MD;  Location: [place];  Service: Cardiovascular;  Laterality: N/A;  . CARDIOVERSION N/A 03/10/2016   Procedure: CARDIOVERSION;  Surgeon: Thayer Headings, MD;  [facility] END[facility]COPY;  Service: Cardiovascular;  Laterality: N/A;  . ESOPHAGOGASTRODUODENOSCOPY N/A 12/07/2017   Procedure: ESOPHAGOGASTRODUODENOSCOPY (EGD);  Surgeon: Irene Shipper, MD;   Location: [facility] ENDOSCOPY;  Service: Endoscopy;  Laterality: N/A;  . EYE MUSCLE SURGERY Right ~ 1966  . TEE WITHOUT CARDIOVERSION N/A 06/29/2017   Procedure: TRANSESOPHAGEAL ECHOCARDIOGRAM (TEE);  Surgeon: Fay Records, MD;  Location: [facility] ENDOSCOPY;  Service: Cardiovascular;  Laterality: N/A;  . TONSILLECTOMY  1971     Current Outpatient Medications  Medication Sig Dispense Refill  . atorvastatin (LIPITOR) 40 MG tablet Take 1 tablet (40 mg total) by mouth daily. 30 tablet 2  . betamethasone dipropionate 0.05 % cream APPLY 1 APPLICATION OF CREAM TWICE DAILY AS NEEDED FOR FLARES    . Blood Glucose Monitoring Suppl (ONETOUCH VERIO FLEX SYSTEM) w/Device KIT Use as instructed to  check blood sugar up to 3 times daily. 1 kit 0  . diclofenac sodium (VOLTAREN) 1 % GEL APPLY 2 GRAMS TOPICALLY 4 (FOUR) TIMES DAILY AS NEEDED (PAIN). 100 g 5  . DULoxetine (CYMBALTA) 20 MG capsule Take 1 capsule (20 mg total) by mouth daily. 30 capsule 2  . empagliflozin (JARDIANCE) 25 MG TABS tablet Take 25 mg by mouth daily before breakfast. 90 tablet 3  . glipiZIDE (GLUCOTROL) 10 MG tablet Take 1 tablet (10 mg total) by mouth 2 (two) times daily before a meal. 60 tablet 12  . glucose blood (ONETOUCH VERIO) test strip Use as instructed to check blood sugar up to 3 times daily. 100 each 11  . Insulin Glargine (LANTUS SOLOSTAR) 100 UNIT/ML Solostar Pen Inject 80 Units into the skin every morning. 15 mL 5  . Insulin Pen Needle (ULTICARE MICRO PEN NEEDLES) 32G X 4 MM MISC 1 applicator by Does not apply route at bedtime. 100 each 2  . lisinopril (ZESTRIL) 10 MG tablet Take 0.5 tablets (5 mg total) by mouth daily. Must have office visit for refills 15 tablet 0  . metFORMIN (GLUCOPHAGE) 1000 MG tablet Take 1 tablet (1,000 mg total) by mouth 2 (two) times daily with a meal. 180 tablet 3  . metoprolol tartrate (LOPRESSOR) 25 MG tablet TAKE 1 & 1/2 TABLET BY MOUTH 2 TIMES DAILY 270 tablet 2  . metroNIDAZOLE (METROCREAM) 0.75 % cream  APPLY THIN LAYER TO FACE TWICE DAILY    . omeprazole (PRILOSEC) 40 MG capsule Take 1 capsule (40 mg total) by mouth daily.    Glory Rosebush DELICA LANCETS 53U MISC Use as instructed to check blood sugar up to 3 times daily. 100 each 11  . Semaglutide,0.25 or 0.5MG/DOS, (OZEMPIC, 0.25 OR 0.5 MG/DOSE,) 2 MG/1.5ML SOPN Inject 0.25 mg into the skin once a week. 1 pen 11  . SKYRIZI, 150 MG DOSE, 75 MG/0.83ML PSKT Inject 150 mg as directed See admin instructions. 4x's per year    . XARELTO 20 MG TABS tablet TAKE 1 TABLET BY MOUTH DAILY WITH SUPPER. 30 tablet 5   No current facility-administered medications for this visit.     Allergies:   Codeine    Social History:  The patient  reports that she quit smoking about 8 years ago. Her smoking use included cigarettes. She has a 22.00 pack-year smoking history. She has never used smokeless tobacco. She reports current alcohol use. She reports that she does not use drugs.   Family History:  The patient's family history includes Hyperlipidemia in her mother; Hypertension in her mother. She was adopted.     Review of Systems:  Physical Exam: Blood pressure 112/76, pulse 77, height _0  (1.651 m), weight 249 lb 12.8 oz (113.3 kg), last menstrual period 04/15/2015, SpO2 96 %.  GEN:  Morbidly obese female,  NAD  HEENT: Normal NECK: No JVD; No carotid bruits LYMPHATICS: No lymphadenopathy CARDIAC: RRR   RESPIRATORY:  Clear to auscultation without rales, wheezing or rhonchi  ABDOMEN: Soft, non-tender, non-distended MUSCULOSKELETAL:  No edema; No deformity  SKIN: Warm and dry NEUROLOGIC:  Alert and oriented x 3   EKG: October 05, 2019: Normal sinus rhythm at 77.  Occasional PACs.  Left anterior fascicular block.  Poor R wave progression due to lead placement/obesity.   Recent Labs: 09/08/2019: ALT 22; BUN 16; Creatinine, Ser 0.84; Hemoglobin 15.6; Platelets 284; Potassium 4.6; Sodium 136 09/16/2019: TSH 1.01    Lipid Panel    Component Value  Date/Time  CHOL 167 09/08/2019 1508   TRIG 353 (H) 09/08/2019 1508   HDL 35 (L) 09/08/2019 1508   CHOLHDL 4.8 (H) 09/08/2019 1508   CHOLHDL 5.2 (H) 11/19/2016 1103   VLDL 59 (H) 11/19/2016 1103   LDLCALC 75 09/08/2019 1508   LDLDIRECT 114.0 02/13/2015 0750      Wt Readings from Last 3 Encounters:  10/05/19 249 lb 12.8 oz (113.3 kg)  09/16/19 250 lb 12.8 oz (113.8 kg)  09/08/19 249 lb (112.9 kg)      Other studies Reviewed: Additional studies/ records that were reviewed today include: . Review of the above records demonstrates:    ASSESSMENT AND PLAN:  1. Atrial fibrillation-she status post A. fib ablation.  She has been maintaining sinus rhythm.  Continue current medications.. Continue Xarelto 20 mg a day.  2. Hypothyroidism -further plans per her primary medical doctor or endocrinologist.  3. Type 2 diabetes mellitus -   4.   Hyperlipidemia: Managed by her primary medical doctor   5.  Morbid obesity: She is contemplating doing gastric bypass.  I warned her that unless she changes her diet and exercise regimen that she would not likely keep the weight off.   Current medicines are reviewed at length with the patient today.  The patient does not have concerns regarding medicines.  The following changes have been made:  no change  Labs/ tests ordered today include:  No orders of the defined types were placed in this encounter.   Disposition:   FU with me in 1 year      Mertie Moores, MD  10/05/2019 3:27 PM    [address], [place], [place]  [address] Phone: [phone]; Fax: [phone]

## 2019-10-05 ENCOUNTER — Ambulatory Visit: Payer: BC Managed Care – PPO | Admitting: Cardiovascular Disease

## 2019-10-05 ENCOUNTER — Other Ambulatory Visit: Payer: Self-pay

## 2019-10-05 ENCOUNTER — Encounter: Payer: Self-pay | Admitting: Cardiovascular Disease

## 2019-10-05 VITALS — BP 112/76 | HR 77 | Ht 65.0 in | Wt 249.8 lb

## 2019-10-05 DIAGNOSIS — I10 Essential (primary) hypertension: Secondary | ICD-10-CM | POA: Diagnosis not present

## 2019-10-05 DIAGNOSIS — I340 Nonrheumatic mitral (valve) insufficiency: Secondary | ICD-10-CM | POA: Diagnosis not present

## 2019-10-05 DIAGNOSIS — I4819 Other persistent atrial fibrillation: Secondary | ICD-10-CM

## 2019-10-05 NOTE — Patient Instructions (Signed)
Medication Instructions:  Your physician recommends that you continue on your current medications as directed. Please refer to the Current Medication list given to you today.  *If you need a refill on your cardiac medications before your next appointment, please call your pharmacy*   Lab Work: None Ordered   Testing/Procedures: None Ordered   Follow-Up: At [facility], you and your health needs are our priority.  As part of our continuing mission to provide you with exceptional heart care, we have created designated Provider Care Teams.  These Care Teams include your primary Cardiologist (physician) and Advanced Practice Providers (APPs -  Physician Assistants and Nurse Practitioners) who all work together to provide you with the care you need, when you need it.  Your next appointment:   12 months  The format for your next appointment:   In Person  Provider:   You may see Philip Nahser, MD or one of the following Advanced Practice Providers on your designated Care Team:    Scott Weaver, PA-C  Vin Bhagat, PA-C  Janine Hammond, NP    

## 2019-10-10 ENCOUNTER — Telehealth: Payer: Self-pay | Admitting: Endocrinology

## 2019-10-10 NOTE — Telephone Encounter (Signed)
OK, please increase Ozempic to 0.5 mg weekly. Please call or message [place] next week, to tell us how the blood sugar is doing, and to report if you have nausea.

## 2019-10-10 NOTE — Telephone Encounter (Signed)
Called pt and informed of Dr. Cordelia Pen response below. Verbalized acceptance and understanding.

## 2019-10-10 NOTE — Telephone Encounter (Signed)
Patient ph# [phone] called re: patient was told by Dr. Loanne Drilling to call and let him know when patient is no longer experiencing nausea so that Ozempic dosage can be increased. Patient states she is no longer experiencing nausea. Please call patient at the ph# listed above to advise.

## 2019-10-12 ENCOUNTER — Other Ambulatory Visit: Payer: Self-pay | Admitting: Internal Medicine

## 2019-10-12 DIAGNOSIS — I10 Essential (primary) hypertension: Secondary | ICD-10-CM

## 2019-10-13 ENCOUNTER — Other Ambulatory Visit: Payer: Self-pay | Admitting: Internal Medicine

## 2019-10-13 MED ORDER — LISINOPRIL 10 MG PO TABS: 5.0000 mg | ORAL_TABLET | Freq: Every day | ORAL | 2 refills | Status: DC

## 2019-10-13 MED FILL — DULoxetine HCL 20 MG CPEP: 20 | 30 days supply | Qty: 30 | Fill #1

## 2019-10-13 MED FILL — XARELTO 20 MG TABLET: 20 | 30 days supply | Qty: 30 | Fill #0

## 2019-10-13 MED FILL — metFORMIN HCL 1000 MG TABS: 1000 | 30 days supply | Qty: 60 | Fill #6

## 2019-10-13 MED FILL — glipiZIDE 10 MG TABS: 10 | 30 days supply | Qty: 60 | Fill #6

## 2019-10-13 MED FILL — LANTUS SOLOSTAR 100 UNITS/M: 100 | 30 days supply | Qty: 24 | Fill #1

## 2019-10-13 MED FILL — ATORVASTATIN CALCIUM 40 MG: 40 | 30 days supply | Qty: 30 | Fill #2

## 2019-10-13 MED FILL — METOPROLOL TARTRATE 25 MG T: 25 | 90 days supply | Qty: 270 | Fill #1

## 2019-10-13 MED FILL — LISINOPRIL 10 MG TABS: 10 | 30 days supply | Qty: 15 | Fill #0

## 2019-10-13 NOTE — Telephone Encounter (Signed)
Xarelto 20mg  refill request received. Pt is 58 years old, weight-113.3kg, Crea-0.84 on 09/08/2019, last seen by Dr. Acie Fredrickson on 10/05/2019, Diagnosis-Afib, CrCl-157.39ml/min; Dose is appropriate based on dosing criteria. Will send in refill to requested pharmacy.

## 2019-10-13 NOTE — Telephone Encounter (Signed)
Luke may you take care of this  

## 2019-10-19 ENCOUNTER — Encounter: Payer: BC Managed Care – PPO | Attending: Internal Medicine | Admitting: Registered"

## 2019-10-19 ENCOUNTER — Encounter: Payer: Self-pay | Admitting: Registered"

## 2019-10-19 ENCOUNTER — Other Ambulatory Visit: Payer: Self-pay

## 2019-10-19 DIAGNOSIS — Z794 Long term (current) use of insulin: Secondary | ICD-10-CM | POA: Insufficient documentation

## 2019-10-19 DIAGNOSIS — E1142 Type 2 diabetes mellitus with diabetic polyneuropathy: Secondary | ICD-10-CM | POA: Insufficient documentation

## 2019-10-19 NOTE — Patient Instructions (Addendum)
Continue drinking plenty of water Continue taking medications as prescribed.  Consider asking your doctor if they think you should have your vitamin D level checked again.  Your goals include: Take vegetables to work to have with your lunch Ideas for nights when you are too tired to cook and want to stop at a restaurant on way home from work: Chicken nuggets with baby carrots; aim for about ~45 g carbs and include veggies as often as possible.  For movement that might help your arthritis look into [employer]

## 2019-10-19 NOTE — Progress Notes (Signed)
Diabetes Self-Management Education  Visit Type: First/Initial  Appt. Start Time: 3:40 Appt. End Time: 4:55  10/19/2019  Kelly Lang, identified by name and date of birth, is a 58 y.o. female with a diagnosis of Diabetes: Type 2.   ASSESSMENT  Last menstrual period 04/15/2015. There is no height or weight on file to calculate BMI.  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 Pt reports that job is not stressful. Pt states she has a 45-60 min commute each way and is tired after work. Pt enjoys cooking, but too tired after work and will stop and pick something up and take home to eat.   SMBG: FBG and before dinner; Since starting Ozempic last month her before dinner numbers are in a good range, but her FBG continue to be high.  Pt reports she eats what she wants to eat and knows she shouldn't sometimes feels like she can do the right thing but when doesn't seem to make a difference can lose motivation. Pt reports chips and dessert are weakness, wants a dessert every meal and sometimes feels like she wants dessert instead of a meal. Patient was teary when talking about wanting to stop eating sweets. Pt states she doesn't keep chips in the house. Pt states she has talked to people who have had bariatric surgery and has helped with their DM.  Sleep: goes to sleep 11 pm or later, wakes up 3-4 am but able to go back to sleep easily, up at 7 am. Pt reports she feels like she is getting enough sleep. naps on days off (60-90 min) after cleaning house falls asleep watching a movie.  Stress: 5/10; worries about a friend who is drinking too much.  Exercise: limited to due to arthritis pain x8 yrs. Much of the pain is in arms, shoulders and lower back. If stands on legs too long, feels like fire going through legs. Pt states she had shots that helped before COVID.  PMH (relevant): Vitamin D deficiency, stroke, GERD  Diabetes Self-Management Education - 10/19/19 1553      Visit Information   Visit Type  First/Initial      Initial Visit   Diabetes Type  Type 2    Are you currently following a meal plan?  No    Are you taking your medications as prescribed?  Yes   Jardiance, glipizide, lantus 80 u, metformin, ozempic started Oct .5, last week increased dose to 0.5 from 0.25 mg   Date Diagnosed  ~2015      Health Coping   How would you rate your overall health?  Fair      Psychosocial Assessment   Patient Belief/Attitude about Diabetes  Defeat/Burnout    How often do you need to have someone help you when you read instructions, pamphlets, or other written materials from your doctor or pharmacy?  1 - Never    What is the last grade level you completed in school?  3rd year college      Complications   Last HgB A1C per patient/outside source  9.2 %   09/08/19   How often do you check your blood sugar?  1-2 times/day    Fasting Blood glucose range (mg/dL)  180-200;130-179   125-200 progressively on lower side since ozempic   Postprandial Blood glucose range (mg/dL)  70-129;130-179   105-135   Number of hypoglycemic episodes per month  0    Have you had a dilated eye exam in  the past 12 months?  Yes    Have you had a dental exam in the past 12 months?  No    Are you checking your feet?  Yes    How many days per week are you checking your feet?  7      Dietary Intake   Breakfast  protein herbalife shake, addes either a banana or PB, protein OR oatmeal    Snack (morning)  none    Lunch  soup OR sandwich OR salad with chicken OR nabs    Snack (afternoon)  none    Dinner  shrimp, green beans, 1-2 hush puppies,    Snack (evening)  kisses, granola, sugar-free candy, likes chocolate OR muffin    Beverage(s)  water, milk, 1x day sugar-free ice coffee from [employer]      Exercise   Exercise Type  ADL's    How many days per week to you exercise?  0    How many minutes per day do you exercise?  0    Total minutes per week of exercise  0      Patient  Education   Previous Diabetes Education  No    Physical activity and exercise   Helped patient identify appropriate exercises in relation to his/her diabetes, diabetes complications and other health issue.      Individualized Goals (developed by patient)   Nutrition  General guidelines for healthy choices and portions discussed    Physical Activity  --   Look for Nia or Yoga videos   Medications  take my medication as prescribed      Outcomes   Expected Outcomes  Demonstrated interest in learning. Expect positive outcomes    Future DMSE  4-6 wks    Program Status  Not Completed       Individualized Plan for Diabetes Self-Management Training:   Learning Objective:  Patient will have a greater understanding of diabetes self-management. Patient education plan is to attend individual and/or group sessions per assessed needs and concerns.   Patient Instructions  Continue drinking plenty of water Continue taking medications as prescribed.  Consider asking your doctor if they think you should have your vitamin D level checked again.  Your goals include: Take vegetables to work to have with your lunch Ideas for nights when you are too tired to cook and want to stop at a restaurant on way home from work: Chicken nuggets with baby carrots; aim for about ~45 g carbs and include veggies as often as possible.  For movement that might help your arthritis look into [employer]   Expected Outcomes:  Demonstrated interest in learning. Expect positive outcomes  Education material provided: Nutrition in the [employer], list of therapists  If problems or questions, patient to contact team via:  Phone and MyChart  Future DSME appointment: 4-6 wks

## 2019-10-25 ENCOUNTER — Other Ambulatory Visit: Payer: Self-pay

## 2019-10-25 ENCOUNTER — Ambulatory Visit
Admission: RE | Admit: 2019-10-25 | Discharge: 2019-10-25 | Disposition: A | Discharge status: Home / Self Care | Payer: BC Managed Care – PPO | Source: Ambulatory Visit | Attending: Internal Medicine | Admitting: Internal Medicine

## 2019-10-25 DIAGNOSIS — Z1231 Encounter for screening mammogram for malignant neoplasm of breast: Secondary | ICD-10-CM

## 2019-10-25 MED FILL — OZEMPIC 0.25 OR 0.5 MG/DOSE: 2 | 56 days supply | Qty: 2 | Fill #1

## 2019-10-31 ENCOUNTER — Other Ambulatory Visit: Payer: Self-pay

## 2019-11-01 ENCOUNTER — Encounter: Payer: Self-pay | Admitting: Endocrinology

## 2019-11-01 ENCOUNTER — Ambulatory Visit: Payer: BC Managed Care – PPO | Admitting: Endocrinology

## 2019-11-01 VITALS — BP 118/78 | HR 78 | Temp 98.4°F | Ht 65.0 in | Wt 245.6 lb

## 2019-11-01 DIAGNOSIS — Z794 Long term (current) use of insulin: Secondary | ICD-10-CM

## 2019-11-01 DIAGNOSIS — E1165 Type 2 diabetes mellitus with hyperglycemia: Secondary | ICD-10-CM

## 2019-11-01 DIAGNOSIS — IMO0002 Reserved for concepts with insufficient information to code with codable children: Secondary | ICD-10-CM

## 2019-11-01 DIAGNOSIS — E1142 Type 2 diabetes mellitus with diabetic polyneuropathy: Secondary | ICD-10-CM | POA: Diagnosis not present

## 2019-11-01 DIAGNOSIS — E118 Type 2 diabetes mellitus with unspecified complications: Secondary | ICD-10-CM

## 2019-11-01 MED ORDER — OZEMPIC (1 MG/DOSE) 2 MG/1.5ML ~~LOC~~ SOPN: 1.0000 mg | PEN_INJECTOR | SUBCUTANEOUS | 11 refills | Status: DC

## 2019-11-01 MED ORDER — GLIPIZIDE 10 MG PO TABS: 5.0000 mg | ORAL_TABLET | Freq: Every day | ORAL | 12 refills | Status: DC

## 2019-11-01 NOTE — Patient Instructions (Addendum)
check your blood sugar twice a day.  vary the time of day when you check, between before the 3 meals, and at bedtime.  also check if you have symptoms of your blood sugar being too high or too low.  please keep a record of the readings and bring it to your next appointment here (or you can bring the meter itself).  You can write it on any piece of paper.  please call us sooner if your blood sugar goes below 70, or if you have a lot of readings over 200. Please reduce the glipizide to 5 mg (1/2 pill), each morning  I have sent a prescription to your pharmacy, to increase the Ozempic again.   Please continue the same other diabetes medications, including the insulin.    Please come back for a follow-up appointment in 6 weeks.

## 2019-11-01 NOTE — Progress Notes (Signed)
Subjective:    Patient ID: Kelly Lang, female    DOB: 10-15-1961, 58 y.o.   MRN: [id number]  HPI Pt returns for f/u of diabetes mellitus: DM type: Insulin-requiring type 2.  Dx'ed: 3154 Complications: gastroparesis, PN, and CVA Therapy: insulin since 2018, Ozempic, and 3 oral meds GDM: never DKA: never Severe hypoglycemia: never Pancreatitis: never Pancreatic imaging: CT (2019): fatty infiltration of the pancreas Other: she also has MNG (stable x many years, so malignancy risk is low; she had right thyroid bx in 1996.  She says it was benign)  Interval history: Denies nausea.  no cbg record, but states cbg's vary from 99-136.  She has just increased Ozempic to 0.5 mg q week.   Past Medical History:  Diagnosis Date  . Arthritis    "all over" (06/30/2017)  . Chicken pox   . Dyslipidemia   . H/O: hypothyroidism    a. thyroid biopsy 1996 with synthroid. Stopped taking rx and was loss to follow up b. normal thyroid function 10/20/13  . High cholesterol   . Hx of cardiovascular stress test    ETT/Lexiscan Myoview (12/2013):  No ischemia; not gated; low risk.  Marland Kitchen Hypertension   . Obesity   . Persistent atrial fibrillation ([place])    a diagnosed 10/25/2013  . Seasonal allergies   . Stroke [facility]) 2016   "some speech problems since" (06/30/2017)  . 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 2/2 Afib with RVR for unknown duration (EF: 40-45%)  . Thyroid disease   . Type II diabetes mellitus ([place])    a. hg A1c 10, newly diagnosed (10/26/13)    Past Surgical History:  Procedure Laterality Date  . ATRIAL FIBRILLATION ABLATION  06/30/2017  . ATRIAL FIBRILLATION ABLATION N/A 06/30/2017   Procedure: Atrial Fibrillation Ablation;  Surgeon: Thompson Grayer, MD;  Location: [place] CV LAB;  Service: Cardiovascular;  Laterality: N/A;  . BIOPSY THYROID  1996  . CARDIOVERSION N/A 12/07/2013   Procedure: CARDIOVERSION;  Surgeon: Casandra Doffing, MD;  Location: [place];  Service:  Cardiovascular;  Laterality: N/A;  . CARDIOVERSION N/A 03/10/2016   Procedure: CARDIOVERSION;  Surgeon: Thayer Headings, MD;  Location: [facility] ENDOSCOPY;  Service: Cardiovascular;  Laterality: N/A;  . ESOPHAGOGASTRODUODENOSCOPY N/A 12/07/2017   Procedure: ESOPHAGOGASTRODUODENOSCOPY (EGD);  Surgeon: Irene Shipper, MD;  Location: [facility] ENDOSCOPY;  Service: Endoscopy;  Laterality: N/A;  . EYE MUSCLE SURGERY Right ~ 1966  . TEE WITHOUT CARDIOVERSION N/A 06/29/2017   Procedure: TRANSESOPHAGEAL ECHOCARDIOGRAM (TEE);  Surgeon: Fay Records, MD;  Location: [employer];  Service: Cardiovascular;  Laterality: N/A;  . TONSILLECTOMY  1971    Social History   Socioeconomic History  . Marital status: Divorced    Spouse name: Not on file  . Number of children: 0  . Years of education: 30  . Highest education level: Not on file  Occupational History  . Occupation: Chief Technology Officer  Social Needs  . Financial resource strain: Not on file  . Food insecurity    Worry: Not on file    Inability: Not on file  . Transportation needs    Medical: Not on file    Non-medical: Not on file  Tobacco Use  . Smoking status: Former Smoker    Packs/day: 1.00    Years: 22.00    Pack years: 22.00    Types: Cigarettes    Quit date: 12/01/2010    Years since quitting: 8.9  . Smokeless tobacco: Never Used  Substance and  Sexual Activity  . Alcohol use: Yes    Comment: 06/30/2017 "glass of wine maybe q other month"  . Drug use: No  . Sexual activity: Not Currently  Lifestyle  . Physical activity    Days per week: Not on file    Minutes per session: Not on file  . Stress: Not on file  Relationships  . Social Herbalist on phone: Not on file    Gets together: Not on file    Attends religious service: Not on file    Active member of club or organization: Not on file    Attends meetings of clubs or organizations: Not on file    Relationship status: Not on file  . Intimate partner violence    Fear of current  or ex partner: Not on file    Emotionally abused: Not on file    Physically abused: Not on file    Forced sexual activity: Not on file  Other Topics Concern  . Not on file  Social History Narrative   Moved to [place] from [place] in 2002.     Fun: shop, sew, decorate   Denies any beliefs effecting healthcare    Current Outpatient Medications on File Prior to Visit  Medication Sig Dispense Refill  . atorvastatin (LIPITOR) 40 MG tablet Take 1 tablet (40 mg total) by mouth daily. 30 tablet 2  . betamethasone dipropionate 0.05 % cream APPLY 1 APPLICATION OF CREAM TWICE DAILY AS NEEDED FOR FLARES    . Blood Glucose Monitoring Suppl ([place]) w/Device KIT Use as instructed to check blood sugar up to 3 times daily. 1 kit 0  . diclofenac sodium (VOLTAREN) 1 % GEL APPLY 2 GRAMS TOPICALLY 4 (FOUR) TIMES DAILY AS NEEDED (PAIN). 100 g 5  . DULoxetine (CYMBALTA) 20 MG capsule Take 1 capsule (20 mg total) by mouth daily. 30 capsule 2  . empagliflozin (JARDIANCE) 25 MG TABS tablet Take 25 mg by mouth daily before breakfast. 90 tablet 3  . glucose blood (ONETOUCH VERIO) test strip Use as instructed to check blood sugar up to 3 times daily. 100 each 11  . Insulin Glargine (LANTUS SOLOSTAR) 100 UNIT/ML Solostar Pen Inject 80 Units into the skin every morning. 15 mL 5  . Insulin Pen Needle (ULTICARE MICRO PEN NEEDLES) 32G X 4 MM MISC 1 applicator by Does not apply route at bedtime. 100 each 2  . lisinopril (ZESTRIL) 10 MG tablet Take 0.5 tablets (5 mg total) by mouth daily. Must have office visit for refills 15 tablet 2  . metFORMIN (GLUCOPHAGE) 1000 MG tablet Take 1 tablet (1,000 mg total) by mouth 2 (two) times daily with a meal. 180 tablet 3  . metoprolol tartrate (LOPRESSOR) 25 MG tablet TAKE 1 & 1/2 TABLET BY MOUTH 2 TIMES DAILY 270 tablet 2  . metroNIDAZOLE (METROCREAM) 0.75 % cream APPLY THIN LAYER TO FACE TWICE DAILY    . omeprazole (PRILOSEC) 40 MG capsule Take 1 capsule (40  mg total) by mouth daily.    Glory Rosebush DELICA LANCETS 29W MISC Use as instructed to check blood sugar up to 3 times daily. 100 each 11  . SKYRIZI, 150 MG DOSE, 75 MG/0.83ML PSKT Inject 150 mg as directed See admin instructions. 4x's per year    . XARELTO 20 MG TABS tablet TAKE 1 TABLET BY MOUTH DAILY WITH SUPPER. 30 tablet 10   No current facility-administered medications on file prior to visit.  Allergies  Allergen Reactions  . Codeine Itching and Rash    Family History  Adopted: Yes  Problem Relation Age of Onset  . Hypertension Mother   . Hyperlipidemia Mother     BP 118/78 (BP Location: Left Arm, Patient Position: Sitting, Cuff Size: Large)   Pulse 78   Temp 98.4 F (36.9 C)   Ht '5\' 5"'  (1.651 m)   Wt 245 lb 9.6 oz (111.4 kg)   LMP 04/15/2015 (LMP Unknown) Comment: last in may  SpO2 98%   BMI 40.87 kg/m   Review of Systems She denies hypoglycemia.      Objective:   Physical Exam VITAL SIGNS:  See vs page GENERAL: no distress Pulses: dorsalis pedis intact bilat.   MSK: no deformity of the feet CV: no leg edema Skin:  no ulcer on the feet.  normal color and temp on the feet. Neuro: sensation is intact to touch on the feet.    Lab Results  Component Value Date   TSH 1.01 09/16/2019      Assessment & Plan:  Insulin-requiring type 2 DM: well-controlled, but glipizide is redundant with the insulin.    Patient Instructions  check your blood sugar twice a day.  vary the time of day when you check, between before the 3 meals, and at bedtime.  also check if you have symptoms of your blood sugar being too high or too low.  please keep a record of the readings and bring it to your next appointment here (or you can bring the meter itself).  You can write it on any piece of paper.  please call us sooner if your blood sugar goes below 70, or if you have a lot of readings over 200. Please reduce the glipizide to 5 mg (1/2 pill), each morning  I have sent a prescription  to your pharmacy, to increase the Ozempic again.   Please continue the same other diabetes medications, including the insulin.    Please come back for a follow-up appointment in 6 weeks.

## 2019-11-11 MED FILL — LANTUS SOLOSTAR 100 UNITS/M: 100 | 30 days supply | Qty: 24 | Fill #2

## 2019-11-11 MED FILL — DULoxetine HCL 20 MG CPEP: 20 | 30 days supply | Qty: 30 | Fill #2

## 2019-11-11 MED FILL — glipiZIDE 10 MG TABS: 10 | 30 days supply | Qty: 60 | Fill #7

## 2019-11-11 MED FILL — LISINOPRIL 10 MG TABS: 10 | 30 days supply | Qty: 15 | Fill #1

## 2019-11-11 MED FILL — metFORMIN HCL 1000 MG TABS: 1000 | 30 days supply | Qty: 60 | Fill #7

## 2019-11-11 MED FILL — ATORVASTATIN CALCIUM 40 MG: 40 | 30 days supply | Qty: 30 | Fill #0

## 2019-11-11 MED FILL — DICLOFENAC SODIUM 1% GEL: 1 | 12 days supply | Qty: 100 | Fill #1

## 2019-11-11 MED FILL — XARELTO 20 MG TABLET: 20 | 30 days supply | Qty: 30 | Fill #1

## 2019-11-14 MED FILL — OZEMPIC 1 MG/DOSE SOPN: 2 | 28 days supply | Qty: 3 | Fill #0

## 2019-11-15 DIAGNOSIS — L400 Psoriasis vulgaris: Secondary | ICD-10-CM | POA: Diagnosis not present

## 2019-11-20 ENCOUNTER — Other Ambulatory Visit: Payer: Self-pay | Admitting: Internal Medicine

## 2019-11-23 ENCOUNTER — Ambulatory Visit: Payer: BC Managed Care – PPO | Admitting: Registered"

## 2019-12-09 MED FILL — OZEMPIC 1 MG/DOSE SOPN: 2 | 28 days supply | Qty: 3 | Fill #1

## 2019-12-12 ENCOUNTER — Other Ambulatory Visit: Payer: Self-pay

## 2019-12-14 ENCOUNTER — Ambulatory Visit (INDEPENDENT_AMBULATORY_CARE_PROVIDER_SITE_OTHER): Payer: Self-pay | Admitting: Endocrinology

## 2019-12-14 ENCOUNTER — Other Ambulatory Visit: Payer: Self-pay | Admitting: Internal Medicine

## 2019-12-14 ENCOUNTER — Encounter: Payer: Self-pay | Admitting: Endocrinology

## 2019-12-14 VITALS — BP 102/60 | HR 81 | Ht 65.0 in | Wt 246.4 lb

## 2019-12-14 DIAGNOSIS — E1142 Type 2 diabetes mellitus with diabetic polyneuropathy: Secondary | ICD-10-CM

## 2019-12-14 DIAGNOSIS — IMO0002 Reserved for concepts with insufficient information to code with codable children: Secondary | ICD-10-CM

## 2019-12-14 DIAGNOSIS — Z794 Long term (current) use of insulin: Secondary | ICD-10-CM

## 2019-12-14 DIAGNOSIS — M2550 Pain in unspecified joint: Secondary | ICD-10-CM

## 2019-12-14 DIAGNOSIS — E1165 Type 2 diabetes mellitus with hyperglycemia: Secondary | ICD-10-CM

## 2019-12-14 DIAGNOSIS — E118 Type 2 diabetes mellitus with unspecified complications: Secondary | ICD-10-CM

## 2019-12-14 LAB — POCT GLYCOSYLATED HEMOGLOBIN (HGB A1C): Hemoglobin A1C: 7.2 % — AB (ref 4.0–5.6)

## 2019-12-14 MED FILL — ATORVASTATIN CALCIUM 40 MG: 40 | 30 days supply | Qty: 30 | Fill #1

## 2019-12-14 MED FILL — metFORMIN HCL 1000 MG TABS: 1000 | 30 days supply | Qty: 60 | Fill #8

## 2019-12-14 MED FILL — XARELTO 20 MG TABLET: 20 | 30 days supply | Qty: 30 | Fill #2

## 2019-12-14 MED FILL — LISINOPRIL 10 MG TABS: 10 | 30 days supply | Qty: 15 | Fill #2

## 2019-12-14 MED FILL — DICLOFENAC SODIUM 1% GEL: 1 | 12 days supply | Qty: 100 | Fill #2

## 2019-12-14 MED FILL — DULoxetine HCL 20 MG CPEP: 20 | 30 days supply | Qty: 30 | Fill #0

## 2019-12-14 MED FILL — LANTUS SOLOSTAR 100 UNITS/M: 100 | 22 days supply | Qty: 18 | Fill #3

## 2019-12-14 MED FILL — JARDIANCE 25 MG TABLET: 25 | 30 days supply | Qty: 30 | Fill #1

## 2019-12-14 MED FILL — glipiZIDE 10 MG TABS: 10 | 30 days supply | Qty: 60 | Fill #8

## 2019-12-14 NOTE — Progress Notes (Signed)
Subjective:    Patient ID: Kelly Lang, female    DOB: 04-04-1961, 59 y.o.   MRN: [id number]  HPI Pt returns for f/u of diabetes mellitus:  DM type: Insulin-requiring type 2.  Dx'ed: 4665 Complications: gastroparesis, PN, and CVA Therapy: insulin since 2018, Ozempic, and 3 oral meds.  GDM: never DKA: never Severe hypoglycemia: never Pancreatitis: never Pancreatic imaging: CT (2019): fatty infiltration of the pancreas Other: she also has MNG (stable x many years, so malignancy risk is low; she had right thyroid bx in 1996.  She says it was benign)  Interval history: Denies nausea.  no cbg record, but states cbg's vary from 90-169.  pt states she feels well in general.  Pt says glipizide is 5-BID Past Medical History:  Diagnosis Date  . Arthritis    "all over" (06/30/2017)  . Chicken pox   . Dyslipidemia   . H/O: hypothyroidism    a. thyroid biopsy 1996 with synthroid. Stopped taking rx and was loss to follow up b. normal thyroid function 10/20/13  . High cholesterol   . Hx of cardiovascular stress test    ETT/Lexiscan Myoview (12/2013):  No ischemia; not gated; low risk.  Marland Kitchen Hypertension   . Obesity   . Persistent atrial fibrillation ([place])    a diagnosed 10/25/2013  . Seasonal allergies   . Stroke [facility]) 2016   "some speech problems since" (06/30/2017)  . 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 2/2 Afib with RVR for unknown duration (EF: 40-45%)  . Thyroid disease   . Type II diabetes mellitus ([place])    a. hg A1c 10, newly diagnosed (10/26/13)    Past Surgical History:  Procedure Laterality Date  . ATRIAL FIBRILLATION ABLATION  06/30/2017  . ATRIAL FIBRILLATION ABLATION N/A 06/30/2017   Procedure: Atrial Fibrillation Ablation;  Surgeon: Thompson Grayer, MD;  Location: [place] CV LAB;  Service: Cardiovascular;  Laterality: N/A;  . BIOPSY THYROID  1996  . CARDIOVERSION N/A 12/07/2013   Procedure: CARDIOVERSION;  Surgeon: Casandra Doffing, MD;  Location: [place];   Service: Cardiovascular;  Laterality: N/A;  . CARDIOVERSION N/A 03/10/2016   Procedure: CARDIOVERSION;  Surgeon: Thayer Headings, MD;  Location: [facility] ENDOSCOPY;  Service: Cardiovascular;  Laterality: N/A;  . ESOPHAGOGASTRODUODENOSCOPY N/A 12/07/2017   Procedure: ESOPHAGOGASTRODUODENOSCOPY (EGD);  Surgeon: Irene Shipper, MD;  Location: [facility] ENDOSCOPY;  Service: Endoscopy;  Laterality: N/A;  . EYE MUSCLE SURGERY Right ~ 1966  . TEE WITHOUT CARDIOVERSION N/A 06/29/2017   Procedure: TRANSESOPHAGEAL ECHOCARDIOGRAM (TEE);  Surgeon: Fay Records, MD;  Location: [place];  Service: Cardiovascular;  Laterality: N/A;  . TONSILLECTOMY  1971    Social History   Socioeconomic History  . Marital status: Divorced    Spouse name: Not on file  . Number of children: 0  . Years of education: 1  . Highest education level: Not on file  Occupational History  . Occupation: Chief Technology Officer  Tobacco Use  . Smoking status: Former Smoker    Packs/day: 1.00    Years: 22.00    Pack years: 22.00    Types: Cigarettes    Quit date: 12/01/2010    Years since quitting: 9.0  . Smokeless tobacco: Never Used  Substance and Sexual Activity  . Alcohol use: Yes    Comment: 06/30/2017 "glass of wine maybe q other month"  . Drug use: No  . Sexual activity: Not Currently  Other Topics Concern  . Not on file  Social History Narrative  Moved to [place] from [place] in 2002.     Fun: shop, sew, decorate   Denies any beliefs effecting healthcare   Social Determinants of Health   Financial Resource Strain:   . Difficulty of Paying Living Expenses: Not on file  Food Insecurity:   . Worried About Charity fundraiser in the Last Year: Not on file  . Ran Out of Food in the Last Year: Not on file  Transportation Needs:   . Lack of Transportation (Medical): Not on file  . Lack of Transportation (Non-Medical): Not on file  Physical Activity:   . Days of Exercise per Week: Not on file  . Minutes of Exercise per  Session: Not on file  Stress:   . Feeling of Stress : Not on file  Social Connections:   . Frequency of Communication with Friends and Family: Not on file  . Frequency of Social Gatherings with Friends and Family: Not on file  . Attends Religious Services: Not on file  . Active Member of Clubs or Organizations: Not on file  . Attends Archivist Meetings: Not on file  . Marital Status: Not on file  Intimate Partner Violence:   . Fear of Current or Ex-Partner: Not on file  . Emotionally Abused: Not on file  . Physically Abused: Not on file  . Sexually Abused: Not on file    Current Outpatient Medications on File Prior to Visit  Medication Sig Dispense Refill  . atorvastatin (LIPITOR) 40 MG tablet Take 1 tablet (40 mg total) by mouth daily. 30 tablet 2  . betamethasone dipropionate 0.05 % cream APPLY 1 APPLICATION OF CREAM TWICE DAILY AS NEEDED FOR FLARES    . Blood Glucose Monitoring Suppl ([place]) w/Device KIT Use as instructed to check blood sugar up to 3 times daily. 1 kit 0  . diclofenac sodium (VOLTAREN) 1 % GEL APPLY 2 GRAMS TOPICALLY 4 (FOUR) TIMES DAILY AS NEEDED (PAIN). 100 g 5  . empagliflozin (JARDIANCE) 25 MG TABS tablet Take 25 mg by mouth daily before breakfast. 90 tablet 3  . glipiZIDE (GLUCOTROL) 10 MG tablet Take 0.5 tablets (5 mg total) by mouth daily before breakfast. 30 tablet 12  . Insulin Glargine (LANTUS SOLOSTAR) 100 UNIT/ML Solostar Pen Inject 80 Units into the skin every morning. 15 mL 5  . Insulin Pen Needle (ULTICARE MICRO PEN NEEDLES) 32G X 4 MM MISC 1 applicator by Does not apply route at bedtime. 100 each 2  . lisinopril (ZESTRIL) 10 MG tablet Take 0.5 tablets (5 mg total) by mouth daily. Must have office visit for refills 15 tablet 2  . metFORMIN (GLUCOPHAGE) 1000 MG tablet Take 1 tablet (1,000 mg total) by mouth 2 (two) times daily with a meal. 180 tablet 3  . metoprolol tartrate (LOPRESSOR) 25 MG tablet TAKE 1 & 1/2 TABLET BY  MOUTH 2 TIMES DAILY 270 tablet 2  . metroNIDAZOLE (METROCREAM) 0.75 % cream APPLY THIN LAYER TO FACE TWICE DAILY    . omeprazole (PRILOSEC) 40 MG capsule Take 1 capsule (40 mg total) by mouth daily.    Glory Rosebush DELICA LANCETS 01S MISC Use as instructed to check blood sugar up to 3 times daily. 100 each 11  . ONETOUCH VERIO test strip USE AS DIRECTED UP TO THREE TIMES DAILY TO  TEST  BLOOD  SUGAR 100 each 11  . Semaglutide, 1 MG/DOSE, (OZEMPIC, 1 MG/DOSE,) 2 MG/1.5ML SOPN Inject 1 mg into the skin once a week.  2 pen 11  . SKYRIZI, 150 MG DOSE, 75 MG/0.83ML PSKT Inject 150 mg as directed See admin instructions. 4x's per year    . XARELTO 20 MG TABS tablet TAKE 1 TABLET BY MOUTH DAILY WITH SUPPER. 30 tablet 10   No current facility-administered medications on file prior to visit.    Allergies  Allergen Reactions  . Codeine Itching and Rash    Family History  Adopted: Yes  Problem Relation Age of Onset  . Hypertension Mother   . Hyperlipidemia Mother     BP 102/60 (BP Location: Left Arm, Patient Position: Sitting, Cuff Size: Large)   Pulse 81   Ht '5\' 5"'  (1.651 m)   Wt 246 lb 6.4 oz (111.8 kg)   LMP 04/15/2015 (LMP Unknown) Comment: last in may  SpO2 98%   BMI 41.00 kg/m    Review of Systems She denies hypoglycemia    Objective:   Physical Exam VITAL SIGNS:  See vs page GENERAL: no distress Pulses: dorsalis pedis intact bilat.   MSK: no deformity of the feet CV: no leg edema Skin:  no ulcer on the feet.  normal color and temp on the feet. Neuro: sensation is intact to touch on the feet, but decreased from normal  A1c=7.2%  Lab Results  Component Value Date   CREATININE 0.84 09/08/2019   BUN 16 09/08/2019   NA 136 09/08/2019   K 4.6 09/08/2019   CL 102 09/08/2019   CO2 20 09/08/2019   Lab Results  Component Value Date   TSH 1.01 09/16/2019      Assessment & Plan:  Insulin-requiring type 2 DM, with PN: well-controlled.  She is ready to d/c glipizide CVA:  she needs to avoid hypoglycemia.  Patient Instructions  check your blood sugar twice a day.  vary the time of day when you check, between before the 3 meals, and at bedtime.  also check if you have symptoms of your blood sugar being too high or too low.  please keep a record of the readings and bring it to your next appointment here (or you can bring the meter itself).  You can write it on any piece of paper.  please call us sooner if your blood sugar goes below 70, or if you have a lot of readings over 200. Please reduce the glipizide to 5 mg (1/2 pill), each morning, and none in the evening.   Please continue the same other diabetes medications, including the insulin.   Please come back for a follow-up appointment in 2 months.

## 2019-12-14 NOTE — Patient Instructions (Addendum)
check your blood sugar twice a day.  vary the time of day when you check, between before the 3 meals, and at bedtime.  also check if you have symptoms of your blood sugar being too high or too low.  please keep a record of the readings and bring it to your next appointment here (or you can bring the meter itself).  You can write it on any piece of paper.  please call us sooner if your blood sugar goes below 70, or if you have a lot of readings over 200. Please reduce the glipizide to 5 mg (1/2 pill), each morning, and none in the evening.   Please continue the same other diabetes medications, including the insulin.   Please come back for a follow-up appointment in 2 months.

## 2020-01-04 MED FILL — OZEMPIC 1 MG/DOSE SOPN: 2 | 28 days supply | Qty: 3 | Fill #2

## 2020-01-17 ENCOUNTER — Telehealth: Payer: Self-pay | Admitting: Internal Medicine

## 2020-01-17 ENCOUNTER — Other Ambulatory Visit: Payer: Self-pay | Admitting: Internal Medicine

## 2020-01-17 DIAGNOSIS — Z794 Long term (current) use of insulin: Secondary | ICD-10-CM

## 2020-01-17 DIAGNOSIS — E1142 Type 2 diabetes mellitus with diabetic polyneuropathy: Secondary | ICD-10-CM

## 2020-01-17 MED ORDER — BENZONATATE 100 MG PO CAPS: 100.0000 mg | ORAL_CAPSULE | Freq: Three times a day (TID) | ORAL | 0 refills | Status: DC | PRN

## 2020-01-17 MED ORDER — ALBUTEROL SULFATE HFA 108 (90 BASE) MCG/ACT IN AERS: 2.0000 | INHALATION_SPRAY | Freq: Four times a day (QID) | RESPIRATORY_TRACT | 0 refills | Status: DC | PRN

## 2020-01-17 MED FILL — DULoxetine HCL 20 MG CPEP: 20 | 30 days supply | Qty: 30 | Fill #1

## 2020-01-17 MED FILL — ATORVASTATIN CALCIUM 40 MG: 40 | 30 days supply | Qty: 30 | Fill #2

## 2020-01-17 NOTE — Telephone Encounter (Signed)
Pt called saying she was tested positive for COVID on 01/12/2020 and is having a cough. She would like something sent to her pharmacy at [facility] and [facility] if possible.

## 2020-01-17 NOTE — Telephone Encounter (Signed)
Phone call placed to patient this evening.  Patient tested positive for Covid 01/12/2020 after exposure to a coworker who was positive.  She got the first dose of the maternal vaccine on 3 February.  She is currently in quarantine in a hotel.  Current symptoms includes dry cough low-grade fever and shortness of breath only if she tries to do a lot of walking.  Temperature over the past 24 hours has been 99.4.  She has a pulse ox machine and states that oxygen level has been hovering around 94% lowest it has gotten has been 91%.  She has been taking zinc and vitamin C.  I will call in a prescription for some Tessalon Perles and an albuterol inhaler.  Patient advised to be seen in the emergency room if shortness of breath worsens.  She expressed understanding.  Patient did not sound in any distress over the phone but did have a dry cough.

## 2020-01-18 ENCOUNTER — Telehealth: Payer: Self-pay | Admitting: Nurse Practitioner

## 2020-01-18 MED FILL — ALBUTEROL SULFATE HFA 108 (: 108 (90 BAS | 25 days supply | Qty: 18 | Fill #0

## 2020-01-18 MED FILL — BENZONATATE 100 MG CAPS: 100 | 10 days supply | Qty: 30 | Fill #0

## 2020-01-18 MED FILL — LANTUS SOLOSTAR 100 UNITS/M: 100 | 22 days supply | Qty: 18 | Fill #0

## 2020-01-18 NOTE — Telephone Encounter (Signed)
Called to Discuss with patient about Covid symptoms and the use of bamlanivimab, a monoclonal antibody infusion for those with mild to moderate Covid symptoms and at a high risk of hospitalization.     Pt is qualified for this infusion at the [facility] infusion center due to co-morbid conditions and/or a member of an at-risk group.     Patient Active Problem List   Diagnosis Date Noted  . Multinodular goiter 09/16/2019  . Chronic pain disorder 08/09/2018  . Diabetic polyneuropathy associated with type 2 diabetes mellitus (HCC) 08/09/2018  . Gastroparesis 12/21/2017  . GI bleed 12/06/2017  . Hepatic steatosis 12/04/2017  . Chronic systolic CHF (congestive heart failure) (HCC) 09/16/2015  . 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 Low back pain 04/03/2014  . Unspecified vitamin D deficiency 01/20/2014  . Essential hypertension, benign 01/20/2014  . Chronic anticoagulation 10/31/2013  . Tachycardia induced cardiomyopathy (HCC)   . H/O: hypothyroidism   . Obesity   . Type 2 diabetes mellitus with diabetic polyneuropathy, with long-term current use of insulin (HCC)   . Arthritis   . Dyslipidemia   . Atrial fibrillation  10/25/2013    Patient declines infusion at this time. Symptoms tier reviewed as well as criteria for ending isolation. Preventative practices reviewed. Patient verbalized understanding.    Patient advised to call back if she decides that she does want to get infusion. Callback number to the infusion center given. Patient advised to go to Urgent care or ED with severe symptoms.   Symptoms started on 01/12/20

## 2020-02-03 ENCOUNTER — Other Ambulatory Visit: Payer: Self-pay

## 2020-02-03 ENCOUNTER — Telehealth: Payer: Self-pay | Admitting: Endocrinology

## 2020-02-03 ENCOUNTER — Other Ambulatory Visit: Payer: Self-pay | Admitting: Internal Medicine

## 2020-02-03 DIAGNOSIS — E1165 Type 2 diabetes mellitus with hyperglycemia: Secondary | ICD-10-CM

## 2020-02-03 DIAGNOSIS — IMO0002 Reserved for concepts with insufficient information to code with codable children: Secondary | ICD-10-CM

## 2020-02-03 DIAGNOSIS — I10 Essential (primary) hypertension: Secondary | ICD-10-CM

## 2020-02-03 MED FILL — OZEMPIC 1 MG/DOSE SOPN: 2 | 28 days supply | Qty: 3 | Fill #3

## 2020-02-03 MED FILL — XARELTO 20 MG TABLET: 20 | 30 days supply | Qty: 30 | Fill #3

## 2020-02-03 NOTE — Telephone Encounter (Signed)
Patient called stating the jardiance was giving her yeast infections and she has stopped taking this medication 3-4 weeks ago. Patient ph# [phone]

## 2020-02-03 NOTE — Telephone Encounter (Signed)
D/c jardiance I'll see you next week.

## 2020-02-03 NOTE — Telephone Encounter (Signed)
Called pt and made her aware of Dr. George Hugh response below.

## 2020-02-03 NOTE — Telephone Encounter (Signed)
Updated chart to reflect allergic reaction to Jardiance as well as discontinued medication d/t allergic reaction.

## 2020-02-03 NOTE — Telephone Encounter (Signed)
Please review pt comment below. Appears she has an appt with you on 02/15/20

## 2020-02-13 ENCOUNTER — Other Ambulatory Visit: Payer: Self-pay

## 2020-02-15 ENCOUNTER — Other Ambulatory Visit: Payer: Self-pay

## 2020-02-15 ENCOUNTER — Ambulatory Visit (INDEPENDENT_AMBULATORY_CARE_PROVIDER_SITE_OTHER): Payer: 59 | Admitting: Endocrinology

## 2020-02-15 ENCOUNTER — Encounter: Payer: Self-pay | Admitting: Endocrinology

## 2020-02-15 VITALS — BP 104/62 | HR 89 | Ht 65.0 in | Wt 244.4 lb

## 2020-02-15 DIAGNOSIS — Z794 Long term (current) use of insulin: Secondary | ICD-10-CM | POA: Diagnosis not present

## 2020-02-15 DIAGNOSIS — E1142 Type 2 diabetes mellitus with diabetic polyneuropathy: Secondary | ICD-10-CM | POA: Diagnosis not present

## 2020-02-15 LAB — POCT GLYCOSYLATED HEMOGLOBIN (HGB A1C): Hemoglobin A1C: 8.5 % — AB (ref 4.0–5.6)

## 2020-02-15 MED ORDER — LANTUS SOLOSTAR 100 UNIT/ML ~~LOC~~ SOPN: 100.0000 [IU] | PEN_INJECTOR | SUBCUTANEOUS | 11 refills | Status: DC

## 2020-02-15 MED FILL — LANTUS SOLOSTAR 100 UNITS/M: 100 | 30 days supply | Qty: 30 | Fill #0

## 2020-02-15 NOTE — Patient Instructions (Addendum)
check your blood sugar twice a day.  vary the time of day when you check, between before the 3 meals, and at bedtime.  also check if you have symptoms of your blood sugar being too high or too low.  please keep a record of the readings and bring it to your next appointment here (or you can bring the meter itself).  You can write it on any piece of paper.  please call us sooner if your blood sugar goes below 70, or if you have a lot of readings over 200. I have sent a prescription to your pharmacy, to increase the Lantus to 100 units each morning. Please continue the same other diabetes medications.  Our next goal is to phase out the glipizide.   Please come back for a follow-up appointment in 2 months.

## 2020-02-15 NOTE — Progress Notes (Signed)
Subjective:    Patient ID: Kelly Lang, female    DOB: 22-Feb-1961, 59 y.o.   MRN: [id number]  HPI Pt returns for f/u of diabetes mellitus:  DM type: Insulin-requiring type 2.  Dx'ed: 8119 Complications: gastroparesis, PN, and CVA Therapy: insulin since 2018, Ozempic, and 2 oral meds.  GDM: never DKA: never Severe hypoglycemia: never Pancreatitis: never Pancreatic imaging: CT (2019): fatty infiltration of the pancreas Other: she also has MNG (stable x many years, so malignancy risk is low; she had right thyroid bx in 1996.  She says it was benign; she takes qd insulin, at least for now.  She did not tolerate Jardiance (vaginitis).   Interval history: no cbg record, but states cbg's are in the 200's.  pt states she feels well in general.   Past Medical History:  Diagnosis Date  . Arthritis    "all over" (06/30/2017)  . Chicken pox   . Dyslipidemia   . H/O: hypothyroidism    a. thyroid biopsy 1996 with synthroid. Stopped taking rx and was loss to follow up b. normal thyroid function 10/20/13  . High cholesterol   . Hx of cardiovascular stress test    ETT/Lexiscan Myoview (12/2013):  No ischemia; not gated; low risk.  Marland Kitchen Hypertension   . Obesity   . Persistent atrial fibrillation ([place])    a diagnosed 10/25/2013  . Seasonal allergies   . Stroke [facility]) 2016   "some speech problems since" (06/30/2017)  . 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 2/2 Afib with RVR for unknown duration (EF: 40-45%)  . Thyroid disease   . Type II diabetes mellitus ([place])    a. hg A1c 10, newly diagnosed (10/26/13)    Past Surgical History:  Procedure Laterality Date  . ATRIAL FIBRILLATION ABLATION  06/30/2017  . ATRIAL FIBRILLATION ABLATION N/A 06/30/2017   Procedure: Atrial Fibrillation Ablation;  Surgeon: Thompson Grayer, MD;  Location: [facility];  Service: Cardiovascular;  Laterality: N/A;  . BIOPSY THYROID  1996  . CARDIOVERSION N/A 12/07/2013   Procedure: CARDIOVERSION;  Surgeon:  Casandra Doffing, MD;  Location: [facility];  Service: Cardiovascular;  Laterality: N/A;  . CARDIOVERSION N/A 03/10/2016   Procedure: CARDIOVERSION;  Surgeon: Thayer Headings, MD;  Location: [facility] ENDOSCOPY;  Service: Cardiovascular;  Laterality: N/A;  . ESOPHAGOGASTRODUODENOSCOPY N/A 12/07/2017   Procedure: ESOPHAGOGASTRODUODENOSCOPY (EGD);  Surgeon: Irene Shipper, MD;  Location: [facility] ENDOSCOPY;  Service: Endoscopy;  Laterality: N/A;  . EYE MUSCLE SURGERY Right ~ 1966  . TEE WITHOUT CARDIOVERSION N/A 06/29/2017   Procedure: TRANSESOPHAGEAL ECHOCARDIOGRAM (TEE);  Surgeon: Fay Records, MD;  Location: [place];  Service: Cardiovascular;  Laterality: N/A;  . TONSILLECTOMY  1971    Social History   Socioeconomic History  . Marital status: Divorced    Spouse name: Not on file  . Number of children: 0  . Years of education: 68  . Highest education level: Not on file  Occupational History  . Occupation: Chief Technology Officer  Tobacco Use  . Smoking status: Former Smoker    Packs/day: 1.00    Years: 22.00    Pack years: 22.00    Types: Cigarettes    Quit date: 12/01/2010    Years since quitting: 9.2  . Smokeless tobacco: Never Used  Substance and Sexual Activity  . Alcohol use: Yes    Comment: 06/30/2017 "glass of wine maybe q other month"  . Drug use: No  . Sexual activity: Not Currently  Other Topics Concern  .  Not on file  Social History Narrative   Moved to [place] from [place] in 2002.     Fun: shop, sew, decorate   Denies any beliefs effecting healthcare   Social Determinants of Health   Financial Resource Strain:   . Difficulty of Paying Living Expenses:   Food Insecurity:   . Worried About Charity fundraiser in the Last Year:   . Arboriculturist in the Last Year:   Transportation Needs:   . Film/video editor (Medical):   Marland Kitchen Lack of Transportation (Non-Medical):   Physical Activity:   . Days of Exercise per Week:   . Minutes of Exercise per Session:   Stress:   .  Feeling of Stress :   Social Connections:   . Frequency of Communication with Friends and Family:   . Frequency of Social Gatherings with Friends and Family:   . Attends Religious Services:   . Active Member of Clubs or Organizations:   . Attends Archivist Meetings:   Marland Kitchen Marital Status:   Intimate Partner Violence:   . Fear of Current or Ex-Partner:   . Emotionally Abused:   Marland Kitchen Physically Abused:   . Sexually Abused:     Current Outpatient Medications on File Prior to Visit  Medication Sig Dispense Refill  . albuterol (VENTOLIN HFA) 108 (90 Base) MCG/ACT inhaler Inhale 2 puffs into the lungs every 6 (six) hours as needed for wheezing or shortness of breath. 8 g 0  . atorvastatin (LIPITOR) 40 MG tablet Take 1 tablet (40 mg total) by mouth daily. 30 tablet 2  . benzonatate (TESSALON PERLES) 100 MG capsule Take 1 capsule (100 mg total) by mouth 3 (three) times daily as needed for cough. 30 capsule 0  . betamethasone dipropionate 0.05 % cream APPLY 1 APPLICATION OF CREAM TWICE DAILY AS NEEDED FOR FLARES    . Blood Glucose Monitoring Suppl ([place]) w/Device KIT Use as instructed to check blood sugar up to 3 times daily. 1 kit 0  . diclofenac sodium (VOLTAREN) 1 % GEL APPLY 2 GRAMS TOPICALLY 4 (FOUR) TIMES DAILY AS NEEDED (PAIN). 100 g 5  . DULoxetine (CYMBALTA) 20 MG capsule TAKE 1 CAPSULE (20 MG TOTAL) BY MOUTH DAILY. 30 capsule 2  . glipiZIDE (GLUCOTROL) 10 MG tablet Take 0.5 tablets (5 mg total) by mouth daily before breakfast. 30 tablet 12  . Insulin Pen Needle (ULTICARE MICRO PEN NEEDLES) 32G X 4 MM MISC 1 applicator by Does not apply route at bedtime. 100 each 2  . lisinopril (ZESTRIL) 10 MG tablet TAKE 1/2 TABLET BY MOUTH ONCE DAILY 15 tablet 2  . metFORMIN (GLUCOPHAGE) 1000 MG tablet TAKE 1 TABLET (1,000 MG TOTAL) BY MOUTH 2 (TWO) TIMES DAILY WITH A MEAL. 60 tablet 2  . metoprolol tartrate (LOPRESSOR) 25 MG tablet TAKE 1 & 1/2 TABLET BY MOUTH 2 TIMES DAILY  270 tablet 2  . metroNIDAZOLE (METROCREAM) 0.75 % cream APPLY THIN LAYER TO FACE TWICE DAILY    . omeprazole (PRILOSEC) 40 MG capsule Take 1 capsule (40 mg total) by mouth daily.    Glory Rosebush DELICA LANCETS 63Z MISC Use as instructed to check blood sugar up to 3 times daily. 100 each 11  . ONETOUCH VERIO test strip USE AS DIRECTED UP TO THREE TIMES DAILY TO  TEST  BLOOD  SUGAR 100 each 11  . Semaglutide, 1 MG/DOSE, (OZEMPIC, 1 MG/DOSE,) 2 MG/1.5ML SOPN Inject 1 mg into the skin once  a week. 2 pen 11  . SKYRIZI, 150 MG DOSE, 75 MG/0.83ML PSKT Inject 150 mg as directed See admin instructions. 4x's per year    . XARELTO 20 MG TABS tablet TAKE 1 TABLET BY MOUTH DAILY WITH SUPPER. 30 tablet 10   No current facility-administered medications on file prior to visit.    Allergies  Allergen Reactions  . Jardiance [Empagliflozin] Other (See Comments)    Yeast infections  . Codeine Itching and Rash    Family History  Adopted: Yes  Problem Relation Age of Onset  . Hypertension Mother   . Hyperlipidemia Mother     BP 104/62   Pulse 89   Ht '5\' 5"'  (1.651 m)   Wt 244 lb 6.4 oz (110.9 kg)   LMP 04/15/2015 (LMP Unknown) Comment: last in may  SpO2 96%   BMI 40.67 kg/m    Review of Systems She denies hypoglycemia.      Objective:   Physical Exam VITAL SIGNS:  See vs page GENERAL: no distress Pulses: dorsalis pedis intact bilat.   MSK: no deformity of the feet CV: no leg edema Skin:  no ulcer on the feet.  normal color and temp on the feet. Neuro: sensation is intact to touch on the feet, but decreased from normal Ext: there is bilateral onychomycosis of the toenails   Lab Results  Component Value Date   HGBA1C 8.5 (A) 02/15/2020       Assessment & Plan:  Type 2 DM, with PN: worse Vaginitis: This limits rx options  Patient Instructions  check your blood sugar twice a day.  vary the time of day when you check, between before the 3 meals, and at bedtime.  also check if you have  symptoms of your blood sugar being too high or too low.  please keep a record of the readings and bring it to your next appointment here (or you can bring the meter itself).  You can write it on any piece of paper.  please call us sooner if your blood sugar goes below 70, or if you have a lot of readings over 200. I have sent a prescription to your pharmacy, to increase the Lantus to 100 units each morning. Please continue the same other diabetes medications.  Our next goal is to phase out the glipizide.   Please come back for a follow-up appointment in 2 months.

## 2020-02-21 ENCOUNTER — Other Ambulatory Visit: Payer: Self-pay | Admitting: Internal Medicine

## 2020-02-21 DIAGNOSIS — I10 Essential (primary) hypertension: Secondary | ICD-10-CM

## 2020-02-21 DIAGNOSIS — IMO0002 Reserved for concepts with insufficient information to code with codable children: Secondary | ICD-10-CM

## 2020-02-21 DIAGNOSIS — E1165 Type 2 diabetes mellitus with hyperglycemia: Secondary | ICD-10-CM

## 2020-02-21 MED FILL — DULoxetine HCL 20 MG CPEP: 20 | 30 days supply | Qty: 30 | Fill #2

## 2020-02-21 MED FILL — LISINOPRIL 10 MG TABS: 10 | 30 days supply | Qty: 15 | Fill #0

## 2020-02-21 MED FILL — ATORVASTATIN CALCIUM 40 MG: 40 | 30 days supply | Qty: 30 | Fill #0

## 2020-02-21 MED FILL — DICLOFENAC SODIUM 1% GEL: 1 | 12 days supply | Qty: 100 | Fill #3

## 2020-02-21 MED FILL — metFORMIN HCL 1000 MG TABS: 1000 | 30 days supply | Qty: 60 | Fill #0

## 2020-03-05 MED FILL — OZEMPIC 1 MG/DOSE SOPN: 2 | 28 days supply | Qty: 3 | Fill #4

## 2020-03-23 ENCOUNTER — Other Ambulatory Visit: Payer: Self-pay | Admitting: Internal Medicine

## 2020-03-23 DIAGNOSIS — M2550 Pain in unspecified joint: Secondary | ICD-10-CM

## 2020-03-23 MED FILL — DICLOFENAC SODIUM 1% GEL: 1 | 12 days supply | Qty: 100 | Fill #4

## 2020-03-23 MED FILL — DULoxetine HCL 20 MG CPEP: 20 | 30 days supply | Qty: 30 | Fill #0

## 2020-03-23 MED FILL — METOPROLOL TARTRATE 25 MG T: 25 | 90 days supply | Qty: 270 | Fill #2

## 2020-03-23 MED FILL — LISINOPRIL 10 MG TABS: 10 | 30 days supply | Qty: 15 | Fill #1

## 2020-03-23 MED FILL — ATORVASTATIN CALCIUM 40 MG: 40 | 30 days supply | Qty: 30 | Fill #1

## 2020-03-23 MED FILL — LANTUS SOLOSTAR 100 UNITS/M: 100 | 30 days supply | Qty: 30 | Fill #1

## 2020-03-23 MED FILL — XARELTO 20 MG TABLET: 20 | 30 days supply | Qty: 30 | Fill #4

## 2020-03-23 MED FILL — metFORMIN HCL 1000 MG TABS: 1000 | 30 days supply | Qty: 60 | Fill #1

## 2020-03-30 ENCOUNTER — Ambulatory Visit: Payer: Self-pay

## 2020-03-30 ENCOUNTER — Other Ambulatory Visit: Payer: Self-pay

## 2020-03-30 ENCOUNTER — Ambulatory Visit (INDEPENDENT_AMBULATORY_CARE_PROVIDER_SITE_OTHER): Payer: 59

## 2020-03-30 ENCOUNTER — Ambulatory Visit (INDEPENDENT_AMBULATORY_CARE_PROVIDER_SITE_OTHER): Payer: 59 | Admitting: Orthopedic Surgery

## 2020-03-30 DIAGNOSIS — M25511 Pain in right shoulder: Secondary | ICD-10-CM

## 2020-03-30 DIAGNOSIS — G8929 Other chronic pain: Secondary | ICD-10-CM | POA: Diagnosis not present

## 2020-03-30 DIAGNOSIS — M25512 Pain in left shoulder: Secondary | ICD-10-CM | POA: Diagnosis not present

## 2020-03-30 DIAGNOSIS — M7542 Impingement syndrome of left shoulder: Secondary | ICD-10-CM

## 2020-03-31 ENCOUNTER — Encounter: Payer: Self-pay | Admitting: Orthopedic Surgery

## 2020-03-31 DIAGNOSIS — M7542 Impingement syndrome of left shoulder: Secondary | ICD-10-CM

## 2020-03-31 MED ORDER — LIDOCAINE HCL 1 % IJ SOLN
5.0000 mL | INTRAMUSCULAR | Status: AC | PRN
  Administered 2020-03-31: 11:00:00 5 mL

## 2020-03-31 MED ORDER — BUPIVACAINE HCL 0.5 % IJ SOLN
9.0000 mL | INTRAMUSCULAR | Status: AC | PRN
Start: 2020-03-31 — End: 2020-03-31
  Administered 2020-03-31: 9 mL via INTRA_ARTICULAR

## 2020-03-31 MED ORDER — METHYLPREDNISOLONE ACETATE 40 MG/ML IJ SUSP
40.0000 mg | INTRAMUSCULAR | Status: AC | PRN
  Administered 2020-03-31: 40 mg via INTRA_ARTICULAR

## 2020-03-31 NOTE — Progress Notes (Signed)
Office Visit Note   Patient: Kelly Lang           Date of Birth: 12/11/1960           MRN: [id number] Visit Date: 03/30/2020 Requested by: Marcine Matar, MD [address] [place],  [place] [address] PCP: Marcine Matar, MD  Subjective: Chief Complaint  Patient presents with  . Shoulder Pain    HPI: Kelly Lang is a 59 year old patient with bilateral shoulder pain left worse than right.  She has diabetes but her's hemoglobin A1c is under good control by her history.  Describes constant pain as well as pain that wakes her from sleep at night.  Localizes the pain to the deltoid region.  She is right-hand dominant.  Tried Voltaren gel without relief.  She is really only been taking Tylenol orally.  The pain does wake her from sleep most nights.  Occurs in both shoulders left worse than right.  Abduction hurts on both sides.  No numbness and tingling but just pain.  Denies any neck symptoms or radicular symptoms.  Has a history of right shoulder injection in 2019.              ROS: All systems reviewed are negative as they relate to the chief complaint within the history of present illness.  Patient denies  fevers or chills.   Assessment & Plan: Visit Diagnoses:  1. Chronic left shoulder pain   2. Chronic right shoulder pain     Plan: Impression is bilateral shoulder pain left worse than right.  This looks like bursitis.  No evidence of frozen shoulder on exam and no weakness or coarse grinding on passive range of motion.  Would like to try subacromial injection into the left shoulder with 7-day return for planned right shoulder subacromial injection.  Decision point that time will be for or against further imaging of the left shoulder depending on her response to the injection.  Follow-Up Instructions: No follow-ups on file.   Orders:  Orders Placed This Encounter  Procedures  . XR Shoulder Right  . XR Shoulder Left   No orders of the defined types were placed in this  encounter.     Procedures: Large Joint Inj: L subacromial bursa on 03/31/2020 10:54 AM Indications: diagnostic evaluation and pain Details: 18 G 1.5 in needle, posterior approach  Arthrogram: No  Medications: 9 mL bupivacaine 0.5 %; 40 mg methylPREDNISolone acetate 40 MG/ML; 5 mL lidocaine 1 % Outcome: tolerated well, no immediate complications Procedure, treatment alternatives, risks and benefits explained, specific risks discussed. Consent was given by the patient. Immediately prior to procedure a time out was called to verify the correct patient, procedure, equipment, support staff and site/side marked as required. Patient was prepped and draped in the usual sterile fashion.       Clinical Data: No additional findings.  Objective: Vital Signs: LMP 04/15/2015 (LMP Unknown) Comment: last in may  Physical Exam:   Constitutional: Patient appears well-developed HEENT:  Head: Normocephalic Eyes:EOM are normal Neck: Normal range of motion Cardiovascular: Normal rate Pulmonary/chest: Effort normal Neurologic: Patient is alert Skin: Skin is warm Psychiatric: Patient has normal mood and affect    Ortho Exam: Ortho exam demonstrates good cervical spine range of motion.  5 out of 5 grip EPL FPL interosseous wrist flexion extension bicep triceps and deltoid strength.  She does have positive impingement signs bilaterally.  No significantly discrete tenderness to palpation of AC joints.  Rotator cuff strength is good  infraspinatus supraspinatus and subscap muscle testing bilaterally.  No other masses lymphadenopathy or skin changes noted in the shoulder girdle region.  Specialty Comments:  No specialty comments available.  Imaging: No results found.   PMFS History: Patient Active Problem List   Diagnosis Date Noted  . Multinodular goiter 09/16/2019  . Chronic pain disorder 08/09/2018  . Diabetic polyneuropathy associated with type 2 diabetes mellitus ([place]) 08/09/2018  .  Gastroparesis 12/21/2017  . GI bleed 12/06/2017  . Hepatic steatosis 12/04/2017  . Chronic systolic CHF (congestive heart failure) ([place]) 09/16/2015  . Cerebrovascular accident (CVA) due to embolism of left middle cerebral artery ([place])   . Low back pain 04/03/2014  . Unspecified vitamin D deficiency 01/20/2014  . Essential hypertension, benign 01/20/2014  . Chronic anticoagulation 10/31/2013  . Tachycardia induced cardiomyopathy ([place])   . H/O: hypothyroidism   . Obesity   . Type 2 diabetes mellitus with diabetic polyneuropathy, with long-term current use of insulin ([place])   . Arthritis   . Dyslipidemia   . Atrial fibrillation  10/25/2013   Past Medical History:  Diagnosis Date  . Arthritis    "all over" (06/30/2017)  . Chicken pox   . Dyslipidemia   . H/O: hypothyroidism    a. thyroid biopsy 1996 with synthroid. Stopped taking rx and was loss to follow up b. normal thyroid function 10/20/13  . High cholesterol   . Hx of cardiovascular stress test    ETT/Lexiscan Myoview (12/2013):  No ischemia; not gated; low risk.  Marland Kitchen Hypertension   . Obesity   . Persistent atrial fibrillation ([place])    a diagnosed 10/25/2013  . Seasonal allergies   . Stroke [facility]) 2016   "some speech problems since" (06/30/2017)  . 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 2/2 Afib with RVR for unknown duration (EF: 40-45%)  . Thyroid disease   . Type II diabetes mellitus (HCC)    a. hg A1c 10, newly diagnosed (10/26/13)    Family History  Adopted: Yes  Problem Relation Age of Onset  . Hypertension Mother   . Hyperlipidemia Mother     Past Surgical History:  Procedure Laterality Date  . ATRIAL FIBRILLATION ABLATION  06/30/2017  . ATRIAL FIBRILLATION ABLATION N/A 06/30/2017   Procedure: Atrial Fibrillation Ablation;  Surgeon: Thompson Grayer, MD;  Location: [facility] [facility];  Service: Cardiovascular;  Laterality: N/A;  . BIOPSY THYROID  1996  . CARDIOVERSION N/A 12/07/2013   Procedure: CARDIOVERSION;   Surgeon: Casandra Doffing, MD;  Location: [facility];  Service: Cardiovascular;  Laterality: N/A;  . CARDIOVERSION N/A 03/10/2016   Procedure: CARDIOVERSION;  Surgeon: Thayer Headings, MD;  Location: [facility] ENDOSCOPY;  Service: Cardiovascular;  Laterality: N/A;  . ESOPHAGOGASTRODUODENOSCOPY N/A 12/07/2017   Procedure: ESOPHAGOGASTRODUODENOSCOPY (EGD);  Surgeon: Irene Shipper, MD;  Location: [facility] ENDOSCOPY;  Service: Endoscopy;  Laterality: N/A;  . EYE MUSCLE SURGERY Right ~ 1966  . TEE WITHOUT CARDIOVERSION N/A 06/29/2017   Procedure: TRANSESOPHAGEAL ECHOCARDIOGRAM (TEE);  Surgeon: Fay Records, MD;  Location: [facility];  Service: Cardiovascular;  Laterality: N/A;  . TONSILLECTOMY  1971   Social History   Occupational History  . Occupation: Chief Technology Officer  Tobacco Use  . Smoking status: Former Smoker    Packs/day: 1.00    Years: 22.00    Pack years: 22.00    Types: Cigarettes    Quit date: 12/01/2010    Years since quitting: 9.3  . Smokeless tobacco: Never Used  Substance and Sexual Activity  .  Alcohol use: Yes    Comment: 06/30/2017 "glass of wine maybe q other month"  . Drug use: No  . Sexual activity: Not Currently

## 2020-04-04 MED FILL — OZEMPIC 1 MG/DOSE SOPN: 2 | 28 days supply | Qty: 3 | Fill #5

## 2020-04-04 NOTE — Progress Notes (Signed)
PCP:  Ladell Pier, MD Primary Cardiologist: Mertie Moores, MD Electrophysiologist: Thompson Grayer, MD   Kelly Lang is a 59 y.o. female seen today for Thompson Grayer, MD for routine electrophysiology followup.  Since last being seen in our clinic the patient reports doing very well. She does not feel as if she has had any breakthrough AF.  she denies chest pain, palpitations, dyspnea, PND, orthopnea, nausea, vomiting, dizziness, syncope, edema, weight gain, or early satiety.  Past Medical History:  Diagnosis Date  . Arthritis    "all over" (06/30/2017)  . Chicken pox   . Dyslipidemia   . H/O: hypothyroidism    a. thyroid biopsy 1996 with synthroid. Stopped taking rx and was loss to follow up b. normal thyroid function 10/20/13  . High cholesterol   . Hx of cardiovascular stress test    ETT/Lexiscan Myoview (12/2013):  No ischemia; not gated; low risk.  Marland Kitchen Hypertension   . Obesity   . Persistent atrial fibrillation ([place])    a diagnosed 10/25/2013  . Seasonal allergies   . Stroke [facility]) 2016   "some speech problems since" (06/30/2017)  . 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 2/2 Afib with RVR for unknown duration (EF: 40-45%)  . Thyroid disease   . Type II diabetes mellitus ([place])    a. hg A1c 10, newly diagnosed (10/26/13)   Past Surgical History:  Procedure Laterality Date  . ATRIAL FIBRILLATION ABLATION  06/30/2017  . ATRIAL FIBRILLATION ABLATION N/A 06/30/2017   Procedure: Atrial Fibrillation Ablation;  Surgeon: Thompson Grayer, MD;  Location: [place] CV LAB;  Service: Cardiovascular;  Laterality: N/A;  . BIOPSY THYROID  1996  . CARDIOVERSION N/A 12/07/2013   Procedure: CARDIOVERSION;  Surgeon: Casandra Doffing, MD;  Location: [place];  Service: Cardiovascular;  Laterality: N/A;  . CARDIOVERSION N/A 03/10/2016   Procedure: CARDIOVERSION;  Surgeon: Thayer Headings, MD;  Location[facility] ENDOSCOPY;  Service: Cardiovascular;  Laterality: N/A;  .  ESOPHAGOGASTRODUODENOSCOPY N/A 12/07/2017   Procedure: ESOPHAGOGASTRODUODENOSCOPY (EGD);  Surgeon: Irene Shipper, MD;  Location: [facility] ENDOSCOPY;  Service: Endoscopy;  Laterality: N/A;  . EYE MUSCLE SURGERY Right ~ 1966  . TEE WITHOUT CARDIOVERSION N/A 06/29/2017   Procedure: TRANSESOPHAGEAL ECHOCARDIOGRAM (TEE);  Surgeon: Fay Records, MD;  Location: [facility] ENDOSCOPY;  Service: Cardiovascular;  Laterality: N/A;  . TONSILLECTOMY  1971    Current Outpatient Medications  Medication Sig Dispense Refill  . atorvastatin (LIPITOR) 40 MG tablet TAKE 1 TABLET (40 MG TOTAL) BY MOUTH DAILY. 30 tablet 2  . benzonatate (TESSALON PERLES) 100 MG capsule Take 1 capsule (100 mg total) by mouth 3 (three) times daily as needed for cough. 30 capsule 0  . betamethasone dipropionate 0.05 % cream APPLY 1 APPLICATION OF CREAM TWICE DAILY AS NEEDED FOR FLARES    . Blood Glucose Monitoring Suppl ([place]) w/Device KIT Use as instructed to check blood sugar up to 3 times daily. 1 kit 0  . diclofenac sodium (VOLTAREN) 1 % GEL APPLY 2 GRAMS TOPICALLY 4 (FOUR) TIMES DAILY AS NEEDED (PAIN). 100 g 5  . DULoxetine (CYMBALTA) 20 MG capsule TAKE 1 CAPSULE (20 MG TOTAL) BY MOUTH DAILY. 30 capsule 0  . glipiZIDE (GLUCOTROL) 10 MG tablet Take 0.5 tablets (5 mg total) by mouth daily before breakfast. 30 tablet 12  . insulin glargine (LANTUS SOLOSTAR) 100 UNIT/ML Solostar Pen Inject 100 Units into the skin every morning. 15 pen 11  . Insulin Pen Needle (ULTICARE MICRO PEN  NEEDLES) 32G X 4 MM MISC 1 applicator by Does not apply route at bedtime. 100 each 2  . lisinopril (ZESTRIL) 10 MG tablet TAKE 1/2 TABLET BY MOUTH ONCE DAILY 15 tablet 2  . metFORMIN (GLUCOPHAGE) 1000 MG tablet TAKE 1 TABLET (1,000 MG TOTAL) BY MOUTH 2 (TWO) TIMES DAILY WITH A MEAL. 60 tablet 3  . metoprolol tartrate (LOPRESSOR) 25 MG tablet TAKE 1 & 1/2 TABLET BY MOUTH 2 TIMES DAILY 270 tablet 2  . metroNIDAZOLE (METROCREAM) 0.75 % cream APPLY THIN LAYER  TO FACE TWICE DAILY    . omeprazole (PRILOSEC) 40 MG capsule Take 1 capsule (40 mg total) by mouth daily.    Glory Rosebush DELICA LANCETS 65L MISC Use as instructed to check blood sugar up to 3 times daily. 100 each 11  . ONETOUCH VERIO test strip USE AS DIRECTED UP TO THREE TIMES DAILY TO  TEST  BLOOD  SUGAR 100 each 11  . Semaglutide, 1 MG/DOSE, (OZEMPIC, 1 MG/DOSE,) 2 MG/1.5ML SOPN Inject 1 mg into the skin once a week. 2 pen 11  . SKYRIZI, 150 MG DOSE, 75 MG/0.83ML PSKT Inject 150 mg as directed See admin instructions. 4x's per year    . XARELTO 20 MG TABS tablet TAKE 1 TABLET BY MOUTH DAILY WITH SUPPER. 30 tablet 10  . albuterol (VENTOLIN HFA) 108 (90 Base) MCG/ACT inhaler Inhale 2 puffs into the lungs every 6 (six) hours as needed for wheezing or shortness of breath. 8 g 0   No current facility-administered medications for this visit.    Allergies  Allergen Reactions  . Jardiance [Empagliflozin] Other (See Comments)    Yeast infections  . Codeine Itching and Rash    Social History   Socioeconomic History  . Marital status: Divorced    Spouse name: Not on file  . Number of children: 0  . Years of education: 22  . Highest education level: Not on file  Occupational History  . Occupation: Chief Technology Officer  Tobacco Use  . Smoking status: Former Smoker    Packs/day: 1.00    Years: 22.00    Pack years: 22.00    Types: Cigarettes    Quit date: 12/01/2010    Years since quitting: 9.3  . Smokeless tobacco: Never Used  Substance and Sexual Activity  . Alcohol use: Yes    Comment: 06/30/2017 "glass of wine maybe q other month"  . Drug use: No  . Sexual activity: Not Currently  Other Topics Concern  . Not on file  Social History Narrative   Moved to [place] from [place] in 2002.     Fun: shop, sew, decorate   Denies any beliefs effecting healthcare   Social Determinants of Health   Financial Resource Strain:   . Difficulty of Paying Living Expenses:   Food Insecurity:     . Worried About Charity fundraiser in the Last Year:   . Arboriculturist in the Last Year:   Transportation Needs:   . Film/video editor (Medical):   Marland Kitchen Lack of Transportation (Non-Medical):   Physical Activity:   . Days of Exercise per Week:   . Minutes of Exercise per Session:   Stress:   . Feeling of Stress :   Social Connections:   . Frequency of Communication with Friends and Family:   . Frequency of Social Gatherings with Friends and Family:   . Attends Religious Services:   . Active Member of Clubs or Organizations:   .  Attends Archivist Meetings:   Marland Kitchen Marital Status:   Intimate Partner Violence:   . Fear of Current or Ex-Partner:   . Emotionally Abused:   Marland Kitchen Physically Abused:   . Sexually Abused:      Review of Systems: General: No chills, fever, night sweats or weight changes  Cardiovascular:  No chest pain, dyspnea on exertion, edema, orthopnea, palpitations, paroxysmal nocturnal dyspnea Dermatological: No rash, lesions or masses Respiratory: No cough, dyspnea Urologic: No hematuria, dysuria Abdominal: No nausea, vomiting, diarrhea, bright red blood per rectum, melena, or hematemesis Neurologic: No visual changes, weakness, changes in mental status All other systems reviewed and are otherwise negative except as noted above.  Physical Exam: Vitals:   04/05/20 0915  BP: 130/82  Pulse: 78  SpO2: 94%  Weight: 244 lb 9.6 oz (110.9 kg)  Height: '5\' 5"'  (1.651 m)    GEN- The patient is well appearing, alert and oriented x 3 today.   HEENT: normocephalic, atraumatic; sclera clear, conjunctiva pink; hearing intact; oropharynx clear; neck supple, no JVP Lymph- no cervical lymphadenopathy Lungs- Clear to ausculation bilaterally, normal work of breathing.  No wheezes, rales, rhonchi Heart- Regular rate and rhythm, no murmurs, rubs or gallops, PMI not laterally displaced GI- soft, non-tender, non-distended, bowel sounds present, no  hepatosplenomegaly Extremities- no clubbing, cyanosis, or edema; DP/PT/radial pulses 2+ bilaterally MS- no significant deformity or atrophy Skin- warm and dry, no rash or lesion Psych- euthymic mood, full affect Neuro- strength and sensation are intact  EKG is ordered. Personal review of EKG from today shows NSR 78 bpm, PR interval 172 ms, QRS 98 ms  Additional studies reviewed include: Previous EP office notes, Echo 12/2017 LVEF 60-65%  Assessment and Plan:  1. Persistent atrial fibrillatoin EKG today shows NSR Continue Xarelto for CHA2DS2VASC of at least 6   She does not see the need for Kardia with no breakthrough AF since last visit. Can consider in future as needed.  2. HTN Continue current medications. She has not yet taken her doses this am.   3. Overweight Body mass index is 40.7 kg/m.  Encouraged exercise and consideration of [place], [place]  04/05/20 9:22 AM

## 2020-04-05 ENCOUNTER — Ambulatory Visit (INDEPENDENT_AMBULATORY_CARE_PROVIDER_SITE_OTHER): Payer: 59 | Admitting: Surgical

## 2020-04-05 ENCOUNTER — Ambulatory Visit (INDEPENDENT_AMBULATORY_CARE_PROVIDER_SITE_OTHER): Payer: 59 | Admitting: Student

## 2020-04-05 ENCOUNTER — Encounter: Payer: Self-pay | Admitting: Student

## 2020-04-05 ENCOUNTER — Other Ambulatory Visit: Payer: Self-pay

## 2020-04-05 VITALS — BP 130/82 | HR 78 | Ht 65.0 in | Wt 244.6 lb

## 2020-04-05 DIAGNOSIS — E669 Obesity, unspecified: Secondary | ICD-10-CM

## 2020-04-05 DIAGNOSIS — M25511 Pain in right shoulder: Secondary | ICD-10-CM | POA: Diagnosis not present

## 2020-04-05 DIAGNOSIS — I4891 Unspecified atrial fibrillation: Secondary | ICD-10-CM | POA: Diagnosis not present

## 2020-04-05 DIAGNOSIS — M25512 Pain in left shoulder: Secondary | ICD-10-CM | POA: Diagnosis not present

## 2020-04-05 DIAGNOSIS — M7541 Impingement syndrome of right shoulder: Secondary | ICD-10-CM

## 2020-04-05 DIAGNOSIS — I10 Essential (primary) hypertension: Secondary | ICD-10-CM

## 2020-04-05 DIAGNOSIS — M7542 Impingement syndrome of left shoulder: Secondary | ICD-10-CM

## 2020-04-05 DIAGNOSIS — Z79899 Other long term (current) drug therapy: Secondary | ICD-10-CM

## 2020-04-05 DIAGNOSIS — G8929 Other chronic pain: Secondary | ICD-10-CM

## 2020-04-05 LAB — CBC
Hematocrit: 45.8 % (ref 34.0–46.6)
Hemoglobin: 15.2 g/dL (ref 11.1–15.9)
MCH: 32.6 pg (ref 26.6–33.0)
MCHC: 33.2 g/dL (ref 31.5–35.7)
MCV: 98 fL — ABNORMAL HIGH (ref 79–97)
Platelets: 267 10*3/uL (ref 150–450)
RBC: 4.66 x10E6/uL (ref 3.77–5.28)
RDW: 12.1 % (ref 11.7–15.4)
WBC: 10.1 10*3/uL (ref 3.4–10.8)

## 2020-04-05 LAB — BASIC METABOLIC PANEL
BUN/Creatinine Ratio: 21 (ref 9–23)
BUN: 15 mg/dL (ref 6–24)
CO2: 22 mmol/L (ref 20–29)
Calcium: 9 mg/dL (ref 8.7–10.2)
Chloride: 104 mmol/L (ref 96–106)
Creatinine, Ser: 0.73 mg/dL (ref 0.57–1.00)
GFR calc Af Amer: 106 mL/min/{1.73_m2} (ref >59)
GFR calc non Af Amer: 92 mL/min/{1.73_m2} (ref >59)
Glucose: 247 mg/dL — ABNORMAL HIGH (ref 65–99)
Potassium: 5 mmol/L (ref 3.5–5.2)
Sodium: 138 mmol/L (ref 134–144)

## 2020-04-05 NOTE — Patient Instructions (Addendum)
Medication Instructions:   none *If you need a refill on your cardiac medications before your next appointment, please call your pharmacy*   Lab Work:  TODAY BMET CBC If you have labs (blood work) drawn today and your tests are completely normal, you will receive your results only by: Marland Kitchen MyChart Message (if you have MyChart) OR . A paper copy in the mail If you have any lab test that is abnormal or we need to change your treatment, we will call you to review the results.   Testing/Procedures: none   Follow-Up: At [facility], you and your health needs are our priority.  As part of our continuing mission to provide you with exceptional heart care, we have created designated Provider Care Teams.  These Care Teams include your primary Cardiologist (physician) and Advanced Practice Providers (APPs -  Physician Assistants and Nurse Practitioners) who all work together to provide you with the care you need, when you need it.  Your next appointment:   1 year(s)  The format for your next appointment:   Either In Person or Virtual  Provider:   Dr Johney Frame   Other Instructions

## 2020-04-07 ENCOUNTER — Encounter: Payer: Self-pay | Admitting: Surgical

## 2020-04-07 NOTE — Progress Notes (Signed)
Office Visit Note   Patient: Kelly Lang           Date of Birth: 1961-10-02           MRN: [id number] Visit Date: 04/05/2020 Requested by: Marcine Matar, MD [address] [place],  [place] [address] PCP: Marcine Matar, MD  Subjective: Chief Complaint  Patient presents with  . Right Shoulder - Pain    HPI: Kelly Lang is a 59 y.o. female who presents to the office complaining of bilateral shoulder pain.  She returns for 1 week follow-up for right shoulder injection.  She presents for right shoulder injection after having left shoulder injection last week.  She notes that her left shoulder is about 30% better following injection last week.  She is able to sleep better and does not have constant 24/7 pain.  However she still has significant amount of pain throughout the day.  She has history of years of bilateral shoulder pain.  She does feel weak subjectively.  Denies any significant neck symptoms or radicular arm pain.  She has a history of diabetes.                ROS:  All systems reviewed are negative as they relate to the chief complaint within the history of present illness.  Patient denies fevers or chills.  Assessment & Plan: Visit Diagnoses:  1. Chronic left shoulder pain   2. Chronic right shoulder pain   3. Shoulder impingement syndrome, left   4. Impingement syndrome of right shoulder     Plan: Patient is a 59 year old female presents complaining of bilateral shoulder pain.  She had a left shoulder injection into the subacromial space last week that provided 30% relief of her symptoms.  She does have weakness of the rotator cuff, particularly infraspinatus of the bilateral shoulder on exam today.  With her marginal improvement in shoulder pain from the injection as well as her weakness on exam, ordered bilateral shoulder MRI arthrogram for evaluation of rotator cuff pathology.  Additionally a right shoulder injection was administered today.  This injection  was administered after a discussion about the need to delay surgery by about 2 months if there is pathology that is found on MRI that is operative.  Patient understands and wishes to proceed with injection anyway.  She tolerated the procedure well.  Follow-up after MRIs to review results.  Follow-Up Instructions: No follow-ups on file.   Orders:  Orders Placed This Encounter  Procedures  . MR SHOULDER RIGHT W CONTRAST  . MR Shoulder Left w/ contrast  . Arthrogram  . Arthrogram   No orders of the defined types were placed in this encounter.     Procedures: No procedures performed   Clinical Data: No additional findings.  Objective: Vital Signs: LMP 04/15/2015 (LMP Unknown) Comment: last in may  Physical Exam:  Constitutional: Patient appears well-developed HEENT:  Head: Normocephalic Eyes:EOM are normal Neck: Normal range of motion Cardiovascular: Normal rate Pulmonary/chest: Effort normal Neurologic: Patient is alert Skin: Skin is warm Psychiatric: Patient has normal mood and affect  Ortho Exam:  Bilateral shoulder Exam Able to forward flex and abduct shoulder overhead Posterior crepitus palpated with passive range of motion of the bilateral shoulders. No loss of ER relative to the other shoulder.  Good endpoint with ER Mild tenderness to palpation over the [facility] joint bilaterally.  Moderate tenderness to palpation over the bicipital groove bilaterally. Good subscapularis, supraspinatus strength.  Weakness with infraspinatus resistance testing  bilaterally Negative Hawkins impingement 5/5 grip strength, forearm pronation/supination, and bicep strength  Specialty Comments:  No specialty comments available.  Imaging: No results found.   PMFS History: Patient Active Problem List   Diagnosis Date Noted  . Multinodular goiter 09/16/2019  . Chronic pain disorder 08/09/2018  . Diabetic polyneuropathy associated with type 2 diabetes mellitus (HCC) 08/09/2018  .  Gastroparesis 12/21/2017  . GI bleed 12/06/2017  . Hepatic steatosis 12/04/2017  . Chronic systolic CHF (congestive heart failure) (HCC) 09/16/2015  . 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 Low back pain 04/03/2014  . Unspecified vitamin D deficiency 01/20/2014  . Essential hypertension, benign 01/20/2014  . Chronic anticoagulation 10/31/2013  . Tachycardia induced cardiomyopathy (HCC)   . H/O: hypothyroidism   . Obesity   . Type 2 diabetes mellitus with diabetic polyneuropathy, with long-term current use of insulin (HCC)   . Arthritis   . Dyslipidemia   . Atrial fibrillation  10/25/2013   Past Medical History:  Diagnosis Date  . Arthritis    "all over" (06/30/2017)  . Chicken pox   . Dyslipidemia   . H/O: hypothyroidism    a. thyroid biopsy 1996 with synthroid. Stopped taking rx and was loss to follow up b. normal thyroid function 10/20/13  . High cholesterol   . Hx of cardiovascular stress test    ETT/Lexiscan Myoview (12/2013):  No ischemia; not gated; low risk.  Marland Kitchen Hypertension   . Obesity   . Persistent atrial fibrillation (HCC)    a diagnosed 10/25/2013  . Seasonal allergies   . Stroke [facility]) 2016   "some speech problems since" (06/30/2017)  . 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 2/2 Afib with RVR for unknown duration (EF: 40-45%)  . Thyroid disease   . Type II diabetes mellitus (HCC)    a. hg A1c 10, newly diagnosed (10/26/13)    Family History  Adopted: Yes  Problem Relation Age of Onset  . Hypertension Mother   . Hyperlipidemia Mother     Past Surgical History:  Procedure Laterality Date  . ATRIAL FIBRILLATION ABLATION  06/30/2017  . ATRIAL FIBRILLATION ABLATION N/A 06/30/2017   Procedure: Atrial Fibrillation Ablation;  Surgeon: Hillis Range, MD;  Location: [facility] INVASIVE CV LAB;  Service: Cardiovascular;  Laterality: N/A;  . BIOPSY THYROID  1996  . CARDIOVERSION N/A 12/07/2013   Procedure: CARDIOVERSION;   Surgeon: Everette Rank, MD;  Location: [facility] ENDOSCOPY;  Service: Cardiovascular;  Laterality: N/A;  . CARDIOVERSION N/A 03/10/2016   Procedure: CARDIOVERSION;  Surgeon: Vesta Mixer, MD;  Location: [facility] ENDOSCOPY;  Service: Cardiovascular;  Laterality: N/A;  . ESOPHAGOGASTRODUODENOSCOPY N/A 12/07/2017   Procedure: ESOPHAGOGASTRODUODENOSCOPY (EGD);  Surgeon: Hilarie Fredrickson, MD;  Location: [facility] ENDOSCOPY;  Service: Endoscopy;  Laterality: N/A;  . EYE MUSCLE SURGERY Right ~ 1966  . TEE WITHOUT CARDIOVERSION N/A 06/29/2017   Procedure: TRANSESOPHAGEAL ECHOCARDIOGRAM (TEE);  Surgeon: Pricilla Riffle, MD;  Location: [facility] ENDOSCOPY;  Service: Cardiovascular;  Laterality: N/A;  . TONSILLECTOMY  1971   Social History   Occupational History  . Occupation: Designer, television/film set  Tobacco Use  . Smoking status: Former Smoker    Packs/day: 1.00    Years: 22.00    Pack years: 22.00    Types: Cigarettes    Quit date: 12/01/2010    Years since quitting: 9.3  . Smokeless tobacco: Never Used  Substance and Sexual Activity  . Alcohol use: Yes    Comment: 06/30/2017 "  glass of wine maybe q other month"  . Drug use: No  . Sexual activity: Not Currently

## 2020-04-11 ENCOUNTER — Ambulatory Visit (HOSPITAL_COMMUNITY)
Admission: EM | Admit: 2020-04-11 | Discharge: 2020-04-11 | Disposition: A | Discharge status: Home / Self Care | Payer: 59 | Attending: Emergency Medicine | Admitting: Emergency Medicine

## 2020-04-11 ENCOUNTER — Encounter (HOSPITAL_COMMUNITY): Payer: Self-pay

## 2020-04-11 ENCOUNTER — Other Ambulatory Visit: Payer: Self-pay

## 2020-04-11 DIAGNOSIS — M26622 Arthralgia of left temporomandibular joint: Secondary | ICD-10-CM | POA: Diagnosis not present

## 2020-04-11 MED ORDER — CYCLOBENZAPRINE HCL 10 MG PO TABS
10.0000 mg | ORAL_TABLET | Freq: Every day | ORAL | 0 refills | Status: DC
Start: 2020-04-11 — End: 2020-05-18

## 2020-04-11 MED ORDER — PREDNISONE 20 MG PO TABS
40.0000 mg | ORAL_TABLET | Freq: Every day | ORAL | 0 refills | Status: AC
Start: 2020-04-11 — End: 2020-04-16

## 2020-04-11 NOTE — Discharge Instructions (Addendum)
1000 mg of Tylenol 3-4 times a day as needed for pain.  Pureed/ liquid diet for the next 3 to 5 days.  Flexeril, especially at night, finish the prednisone unless a provider tells you to stop.  Modify your diet because the prednisone will elevate your blood sugar.  If this persists you may need to see a dentist, this may be coming from you grinding your teeth at night and you simply not being aware of that.

## 2020-04-11 NOTE — ED Provider Notes (Signed)
HPI  SUBJECTIVE:  Kelly Lang is a 59 y.o. female who presents with the acute onset of sharp, stabbing, constant left ear pain waking her up from sleep at 5:00 this morning.  She denies sore throat.  No tenderness, change in hearing, otorrhea, foreign body insertion, recent swimming.  No vertigo, jaw or ear popping.  No recent URI or allergy symptoms.  No fevers.  No antibiotics in the past month.  No antipyretic in the past 4 to 6 hours.  She denies grinding her teeth.  No dental pain.  No facial rash.  She has not tried anything for this.  No alleviating factors.  Symptoms are worse when she yawns or opens her mouth wide.  She has a past medical history of atrial fibrillation on Xarelto, stroke, hypertension, diabetes, CHF, GI bleed, chickenpox.  No history of frequent otitis media, shingles, TMJ arthralgia.  CBJ:SEGBTDV, Dalbert Batman, MD  Past Medical History:  Diagnosis Date  . Arthritis    "all over" (06/30/2017)  . Chicken pox   . Dyslipidemia   . H/O: hypothyroidism    a. thyroid biopsy 1996 with synthroid. Stopped taking rx and was loss to follow up b. normal thyroid function 10/20/13  . High cholesterol   . Hx of cardiovascular stress test    ETT/Lexiscan Myoview (12/2013):  No ischemia; not gated; low risk.  Marland Kitchen Hypertension   . Obesity   . Persistent atrial fibrillation ([place])    a diagnosed 10/25/2013  . Seasonal allergies   . Stroke [facility]) 2016   "some speech problems since" (06/30/2017)  . 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 2/2 Afib with RVR for unknown duration (EF: 40-45%)  . Thyroid disease   . Type II diabetes mellitus ([place])    a. hg A1c 10, newly diagnosed (10/26/13)    Past Surgical History:  Procedure Laterality Date  . ATRIAL FIBRILLATION ABLATION  06/30/2017  . ATRIAL FIBRILLATION ABLATION N/A 06/30/2017   Procedure: Atrial Fibrillation Ablation;  Surgeon: Thompson Grayer, MD;  Location: [place] [facility];  Service: Cardiovascular;  Laterality: N/A;  .  BIOPSY THYROID  1996  . CARDIOVERSION N/A 12/07/2013   Procedure: CARDIOVERSION;  Surgeon: Casandra Doffing, MD;  Location: [place];  Service: Cardiovascular;  Laterality: N/A;  . CARDIOVERSION N/A 03/10/2016   Procedure: CARDIOVERSION;  Surgeon: Thayer Headings, MD;  Location: [facility] ENDOSCOPY;  Service: Cardiovascular;  Laterality: N/A;  . ESOPHAGOGASTRODUODENOSCOPY N/A 12/07/2017   Procedure: ESOPHAGOGASTRODUODENOSCOPY (EGD);  Surgeon: Irene Shipper, MD;  Location: [facility] ENDOSCOPY;  Service: Endoscopy;  Laterality: N/A;  . EYE MUSCLE SURGERY Right ~ 1966  . TEE WITHOUT CARDIOVERSION N/A 06/29/2017   Procedure: TRANSESOPHAGEAL ECHOCARDIOGRAM (TEE);  Surgeon: Fay Records, MD;  Location: [facility] ENDOSCOPY;  Service: Cardiovascular;  Laterality: N/A;  . TONSILLECTOMY  1971    Family History  Adopted: Yes  Problem Relation Age of Onset  . Hypertension Mother   . Hyperlipidemia Mother     Social History   Tobacco Use  . Smoking status: Former Smoker    Packs/day: 1.00    Years: 22.00    Pack years: 22.00    Types: Cigarettes    Quit date: 12/01/2010    Years since quitting: 9.3  . Smokeless tobacco: Never Used  Substance Use Topics  . Alcohol use: Yes    Comment: 06/30/2017 "glass of wine maybe q other month"  . Drug use: No    No current facility-administered medications for this encounter.  Current Outpatient  Medications:  .  albuterol (VENTOLIN HFA) 108 (90 Base) MCG/ACT inhaler, Inhale 2 puffs into the lungs every 6 (six) hours as needed for wheezing or shortness of breath., Disp: 8 g, Rfl: 0 .  atorvastatin (LIPITOR) 40 MG tablet, TAKE 1 TABLET (40 MG TOTAL) BY MOUTH DAILY., Disp: 30 tablet, Rfl: 2 .  benzonatate (TESSALON PERLES) 100 MG capsule, Take 1 capsule (100 mg total) by mouth 3 (three) times daily as needed for cough., Disp: 30 capsule, Rfl: 0 .  betamethasone dipropionate 0.05 % cream, APPLY 1 APPLICATION OF CREAM TWICE DAILY AS NEEDED FOR FLARES, Disp: , Rfl:  .  Blood Glucose  Monitoring Suppl (ONETOUCH VERIO FLEX SYSTEM) w/Device KIT, Use as instructed to check blood sugar up to 3 times daily., Disp: 1 kit, Rfl: 0 .  cyclobenzaprine (FLEXERIL) 10 MG tablet, Take 1 tablet (10 mg total) by mouth at bedtime., Disp: 20 tablet, Rfl: 0 .  diclofenac sodium (VOLTAREN) 1 % GEL, APPLY 2 GRAMS TOPICALLY 4 (FOUR) TIMES DAILY AS NEEDED (PAIN)., Disp: 100 g, Rfl: 5 .  DULoxetine (CYMBALTA) 20 MG capsule, TAKE 1 CAPSULE (20 MG TOTAL) BY MOUTH DAILY., Disp: 30 capsule, Rfl: 0 .  glipiZIDE (GLUCOTROL) 10 MG tablet, Take 0.5 tablets (5 mg total) by mouth daily before breakfast., Disp: 30 tablet, Rfl: 12 .  insulin glargine (LANTUS SOLOSTAR) 100 UNIT/ML Solostar Pen, Inject 100 Units into the skin every morning., Disp: 15 pen, Rfl: 11 .  Insulin Pen Needle (ULTICARE MICRO PEN NEEDLES) 32G X 4 MM MISC, 1 applicator by Does not apply route at bedtime., Disp: 100 each, Rfl: 2 .  lisinopril (ZESTRIL) 10 MG tablet, TAKE 1/2 TABLET BY MOUTH ONCE DAILY, Disp: 15 tablet, Rfl: 2 .  metFORMIN (GLUCOPHAGE) 1000 MG tablet, TAKE 1 TABLET (1,000 MG TOTAL) BY MOUTH 2 (TWO) TIMES DAILY WITH A MEAL., Disp: 60 tablet, Rfl: 3 .  metoprolol tartrate (LOPRESSOR) 25 MG tablet, TAKE 1 & 1/2 TABLET BY MOUTH 2 TIMES DAILY, Disp: 270 tablet, Rfl: 2 .  metroNIDAZOLE (METROCREAM) 0.75 % cream, APPLY THIN LAYER TO FACE TWICE DAILY, Disp: , Rfl:  .  omeprazole (PRILOSEC) 40 MG capsule, Take 1 capsule (40 mg total) by mouth daily., Disp: , Rfl:  .  ONETOUCH DELICA LANCETS 16X MISC, Use as instructed to check blood sugar up to 3 times daily., Disp: 100 each, Rfl: 11 .  ONETOUCH VERIO test strip, USE AS DIRECTED UP TO THREE TIMES DAILY TO  TEST  BLOOD  SUGAR, Disp: 100 each, Rfl: 11 .  predniSONE (DELTASONE) 20 MG tablet, Take 2 tablets (40 mg total) by mouth daily with breakfast for 5 days., Disp: 10 tablet, Rfl: 0 .  Semaglutide, 1 MG/DOSE, (OZEMPIC, 1 MG/DOSE,) 2 MG/1.5ML SOPN, Inject 1 mg into the skin once a week.,  Disp: 2 pen, Rfl: 11 .  SKYRIZI, 150 MG DOSE, 75 MG/0.83ML PSKT, Inject 150 mg as directed See admin instructions. 4x's per year, Disp: , Rfl:  .  XARELTO 20 MG TABS tablet, TAKE 1 TABLET BY MOUTH DAILY WITH SUPPER., Disp: 30 tablet, Rfl: 10  Allergies  Allergen Reactions  . Jardiance [Empagliflozin] Other (See Comments)    Yeast infections  . Codeine Itching and Rash     ROS  As noted in HPI.   Physical Exam  BP (!) 123/92 (BP Location: Right Arm)   Pulse 85   Temp 98.7 F (37.1 C) (Oral)   Resp 20   Wt 110.7 kg   LMP  04/15/2015 (LMP Unknown) Comment: last in may  SpO2 98%   BMI 40.60 kg/m   Constitutional: Well developed, well nourished, no acute distress Eyes:  EOMI, conjunctiva normal bilaterally HENT: Normocephalic, atraumatic,mucus membranes moist.  Right TM normal. Left external ear normal.  Pain with palpation of the tragus.  No pain with traction on the pinna.  No tenderness over the mastoid.  No swelling behind the ear.  External ear canal, TM normal.  Positive tenderness of the left TMJ.  No appreciable crepitus. Respiratory: Normal inspiratory effort Cardiovascular: Normal rate GI: nondistended skin: No facial rash, skin intact Musculoskeletal: no deformities Neurologic: Alert & oriented x 3, no focal neuro deficits Psychiatric: Speech and behavior appropriate   ED Course   Medications - No data to display  No orders of the defined types were placed in this encounter.   No results found for this or any previous visit (from the past 24 hour(s)). No results found.  ED Clinical Impression  1. Arthralgia of left temporomandibular joint      ED Assessment/Plan  Patient with TMJ arthralgia.  Early shingles also in the differential given the amount of pain, however she has tenderness over the TMJ.  She is unable to take NSAIDs.  Home with Tylenol 1000 mg 4 times daily,  offered prescription of Norco, patient declined states that it makes her itch.   Soft/liquid diet for the next 3 to 5 days, Flexeril, 40 mg prednisone for 5 days.  Advised her that this will elevate her sugars, she states that she will compensate by modifying her diet.  Follow-up with PMD as needed,  Discussed MDM, treatment plan, and plan for follow-up with patient. patient agrees with plan.   Meds ordered this encounter  Medications  . predniSONE (DELTASONE) 20 MG tablet    Sig: Take 2 tablets (40 mg total) by mouth daily with breakfast for 5 days.    Dispense:  10 tablet    Refill:  0  . cyclobenzaprine (FLEXERIL) 10 MG tablet    Sig: Take 1 tablet (10 mg total) by mouth at bedtime.    Dispense:  20 tablet    Refill:  0    *This clinic note was created using Lobby[employer]. Therefore, there may be occasional mistakes despite careful proofreading.   ?    Melynda Ripple, MD 04/12/20 [phone]

## 2020-04-11 NOTE — ED Triage Notes (Signed)
Pt states she's having left ear pain radiating down in to her neck on the left side. This started last night.

## 2020-04-16 ENCOUNTER — Other Ambulatory Visit: Payer: Self-pay

## 2020-04-18 ENCOUNTER — Other Ambulatory Visit: Payer: Self-pay

## 2020-04-18 ENCOUNTER — Other Ambulatory Visit: Payer: Self-pay | Admitting: Endocrinology

## 2020-04-18 ENCOUNTER — Encounter: Payer: Self-pay | Admitting: Endocrinology

## 2020-04-18 ENCOUNTER — Ambulatory Visit (INDEPENDENT_AMBULATORY_CARE_PROVIDER_SITE_OTHER): Payer: 59 | Admitting: Endocrinology

## 2020-04-18 VITALS — BP 118/62 | HR 83 | Ht 65.0 in | Wt 248.0 lb

## 2020-04-18 DIAGNOSIS — Z794 Long term (current) use of insulin: Secondary | ICD-10-CM

## 2020-04-18 DIAGNOSIS — E1142 Type 2 diabetes mellitus with diabetic polyneuropathy: Secondary | ICD-10-CM

## 2020-04-18 LAB — POCT GLYCOSYLATED HEMOGLOBIN (HGB A1C): Hemoglobin A1C: 9.1 % — AB (ref 4.0–5.6)

## 2020-04-18 MED ORDER — LANTUS SOLOSTAR 100 UNIT/ML ~~LOC~~ SOPN: 130.0000 [IU] | PEN_INJECTOR | SUBCUTANEOUS | 11 refills | Status: DC

## 2020-04-18 MED ORDER — GLIPIZIDE 5 MG PO TABS: 2.5000 mg | ORAL_TABLET | Freq: Every day | ORAL | 3 refills | Status: DC

## 2020-04-18 MED FILL — LANTUS SOLOSTAR 100 UNITS/M: 100 | 11 days supply | Qty: 15 | Fill #0

## 2020-04-18 MED FILL — glipiZIDE 5 MG TABS: 5 | 30 days supply | Qty: 15 | Fill #0

## 2020-04-18 NOTE — Progress Notes (Signed)
Subjective:    Patient ID: Kelly Lang, female    DOB: 10-Mar-1961, 59 y.o.   MRN: [id number]  HPI Pt returns for f/u of diabetes mellitus:  DM type: Insulin-requiring type 2.  Dx'ed: 2016.  Complications: gastroparesis, PN, and CVA.  Therapy: insulin since 2018, Ozempic, and 2 oral meds.  GDM: never DKA: never Severe hypoglycemia: never Pancreatitis: never Pancreatic imaging: CT (2019): fatty infiltration of the pancreas Other: she also has MNG (stable x many years, so malignancy risk is minimal; she had right thyroid bx in 1996.  She says it was benign; she takes qd insulin, at least for now.  She did not tolerate Jardiance (vaginitis).   Interval history: no cbg record, but states cbg's vary from 119-401.  There is no trend throughout the day.  pt states she feels well in general.   Past Medical History:  Diagnosis Date  . Arthritis    "all over" (06/30/2017)  . Chicken pox   . Dyslipidemia   . H/O: hypothyroidism    a. thyroid biopsy 1996 with synthroid. Stopped taking rx and was loss to follow up b. normal thyroid function 10/20/13  . High cholesterol   . Hx of cardiovascular stress test    ETT/Lexiscan Myoview (12/2013):  No ischemia; not gated; low risk.  Marland Kitchen Hypertension   . Obesity   . Persistent atrial fibrillation ([place])    a diagnosed 10/25/2013  . Seasonal allergies   . Stroke [facility]) 2016   "some speech problems since" (06/30/2017)  . 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 2/2 Afib with RVR for unknown duration (EF: 40-45%)  . Thyroid disease   . Type II diabetes mellitus ([place])    a. hg A1c 10, newly diagnosed (10/26/13)    Past Surgical History:  Procedure Laterality Date  . ATRIAL FIBRILLATION ABLATION  06/30/2017  . ATRIAL FIBRILLATION ABLATION N/A 06/30/2017   Procedure: Atrial Fibrillation Ablation;  Surgeon: Thompson Grayer, MD;  Location: [place] [facility];  Service: Cardiovascular;  Laterality: N/A;  . BIOPSY THYROID  1996  . CARDIOVERSION N/A  12/07/2013   Procedure: CARDIOVERSION;  Surgeon: Casandra Doffing, MD;  Location: [place];  Service: Cardiovascular;  Laterality: N/A;  . CARDIOVERSION N/A 03/10/2016   Procedure: CARDIOVERSION;  Surgeon: Thayer Headings, MD;  Location: [facility] ENDOSCOPY;  Service: Cardiovascular;  Laterality: N/A;  . ESOPHAGOGASTRODUODENOSCOPY N/A 12/07/2017   Procedure: ESOPHAGOGASTRODUODENOSCOPY (EGD);  Surgeon: Irene Shipper, MD;  Location: [facility] ENDOSCOPY;  Service: Endoscopy;  Laterality: N/A;  . EYE MUSCLE SURGERY Right ~ 1966  . TEE WITHOUT CARDIOVERSION N/A 06/29/2017   Procedure: TRANSESOPHAGEAL ECHOCARDIOGRAM (TEE);  Surgeon: Fay Records, MD;  Location: [place];  Service: Cardiovascular;  Laterality: N/A;  . TONSILLECTOMY  1971    Social History   Socioeconomic History  . Marital status: Divorced    Spouse name: Not on file  . Number of children: 0  . Years of education: 76  . Highest education level: Not on file  Occupational History  . Occupation: Chief Technology Officer  Tobacco Use  . Smoking status: Former Smoker    Packs/day: 1.00    Years: 22.00    Pack years: 22.00    Types: Cigarettes    Quit date: 12/01/2010    Years since quitting: 9.3  . Smokeless tobacco: Never Used  Substance and Sexual Activity  . Alcohol use: Yes    Comment: 06/30/2017 "glass of wine maybe q other month"  . Drug use: No  .  Sexual activity: Not Currently  Other Topics Concern  . Not on file  Social History Narrative   Moved to [place] from [place] in 2002.     Fun: shop, sew, decorate   Denies any beliefs effecting healthcare   Social Determinants of Health   Financial Resource Strain:   . Difficulty of Paying Living Expenses:   Food Insecurity:   . Worried About Charity fundraiser in the Last Year:   . Arboriculturist in the Last Year:   Transportation Needs:   . Film/video editor (Medical):   Marland Kitchen Lack of Transportation (Non-Medical):   Physical Activity:   . Days of Exercise per Week:   .  Minutes of Exercise per Session:   Stress:   . Feeling of Stress :   Social Connections:   . Frequency of Communication with Friends and Family:   . Frequency of Social Gatherings with Friends and Family:   . Attends Religious Services:   . Active Member of Clubs or Organizations:   . Attends Archivist Meetings:   Marland Kitchen Marital Status:   Intimate Partner Violence:   . Fear of Current or Ex-Partner:   . Emotionally Abused:   Marland Kitchen Physically Abused:   . Sexually Abused:     Current Outpatient Medications on File Prior to Visit  Medication Sig Dispense Refill  . atorvastatin (LIPITOR) 40 MG tablet TAKE 1 TABLET (40 MG TOTAL) BY MOUTH DAILY. 30 tablet 2  . betamethasone dipropionate 0.05 % cream APPLY 1 APPLICATION OF CREAM TWICE DAILY AS NEEDED FOR FLARES    . Blood Glucose Monitoring Suppl ([place]) w/Device KIT Use as instructed to check blood sugar up to 3 times daily. 1 kit 0  . cyclobenzaprine (FLEXERIL) 10 MG tablet Take 1 tablet (10 mg total) by mouth at bedtime. 20 tablet 0  . diclofenac sodium (VOLTAREN) 1 % GEL APPLY 2 GRAMS TOPICALLY 4 (FOUR) TIMES DAILY AS NEEDED (PAIN). 100 g 5  . DULoxetine (CYMBALTA) 20 MG capsule TAKE 1 CAPSULE (20 MG TOTAL) BY MOUTH DAILY. 30 capsule 0  . Insulin Pen Needle (ULTICARE MICRO PEN NEEDLES) 32G X 4 MM MISC 1 applicator by Does not apply route at bedtime. 100 each 2  . lisinopril (ZESTRIL) 10 MG tablet TAKE 1/2 TABLET BY MOUTH ONCE DAILY 15 tablet 2  . metFORMIN (GLUCOPHAGE) 1000 MG tablet TAKE 1 TABLET (1,000 MG TOTAL) BY MOUTH 2 (TWO) TIMES DAILY WITH A MEAL. 60 tablet 3  . metoprolol tartrate (LOPRESSOR) 25 MG tablet TAKE 1 & 1/2 TABLET BY MOUTH 2 TIMES DAILY 270 tablet 2  . metroNIDAZOLE (METROCREAM) 0.75 % cream APPLY THIN LAYER TO FACE TWICE DAILY    . omeprazole (PRILOSEC) 40 MG capsule Take 1 capsule (40 mg total) by mouth daily.    Glory Rosebush DELICA LANCETS 54Y MISC Use as instructed to check blood sugar up to 3  times daily. 100 each 11  . ONETOUCH VERIO test strip USE AS DIRECTED UP TO THREE TIMES DAILY TO  TEST  BLOOD  SUGAR 100 each 11  . Semaglutide, 1 MG/DOSE, (OZEMPIC, 1 MG/DOSE,) 2 MG/1.5ML SOPN Inject 1 mg into the skin once a week. 2 pen 11  . SKYRIZI, 150 MG DOSE, 75 MG/0.83ML PSKT Inject 150 mg as directed See admin instructions. 4x's per year    . XARELTO 20 MG TABS tablet TAKE 1 TABLET BY MOUTH DAILY WITH SUPPER. 30 tablet 10   No current  facility-administered medications on file prior to visit.    Allergies  Allergen Reactions  . Jardiance [Empagliflozin] Other (See Comments)    Yeast infections  . Codeine Itching and Rash    Family History  Adopted: Yes  Problem Relation Age of Onset  . Hypertension Mother   . Hyperlipidemia Mother     BP 118/62   Pulse 83   Ht '5\' 5"'  (1.651 m)   Wt 248 lb (112.5 kg)   LMP 04/15/2015 (LMP Unknown) Comment: last in may  SpO2 98%   BMI 41.27 kg/m    Review of Systems She denies hypoglycemia    Objective:   Physical Exam VITAL SIGNS:  See vs page GENERAL: no distress Pulses: dorsalis pedis intact bilat.   MSK: no deformity of the feet CV: no leg edema Skin:  no ulcer on the feet.  normal color and temp on the feet. Neuro: sensation is intact to touch on the feet, but decreased from normal Ext: there is bilateral onychomycosis of the toenails   Lab Results  Component Value Date   HGBA1C 9.1 (A) 04/18/2020       Assessment & Plan:  Insulin-requiring type 2 DM, with PN: worse  Patient Instructions  check your blood sugar twice a day.  vary the time of day when you check, between before the 3 meals, and at bedtime.  also check if you have symptoms of your blood sugar being too high or too low.  please keep a record of the readings and bring it to your next appointment here (or you can bring the meter itself).  You can write it on any piece of paper.  please call us sooner if your blood sugar goes below 70, or if you have a lot  of readings over 200. I have sent a prescription to your pharmacy, to increase the Lantus to 130 units each morning.   I have sent a prescription to your pharmacy, to reduce the glipizide.   Please come back for a follow-up appointment in 2 months.

## 2020-04-18 NOTE — Patient Instructions (Addendum)
check your blood sugar twice a day.  vary the time of day when you check, between before the 3 meals, and at bedtime.  also check if you have symptoms of your blood sugar being too high or too low.  please keep a record of the readings and bring it to your next appointment here (or you can bring the meter itself).  You can write it on any piece of paper.  please call us sooner if your blood sugar goes below 70, or if you have a lot of readings over 200. I have sent a prescription to your pharmacy, to increase the Lantus to 130 units each morning.   I have sent a prescription to your pharmacy, to reduce the glipizide.   Please come back for a follow-up appointment in 2 months.

## 2020-04-26 ENCOUNTER — Other Ambulatory Visit: Payer: Self-pay | Admitting: Internal Medicine

## 2020-04-26 DIAGNOSIS — M2550 Pain in unspecified joint: Secondary | ICD-10-CM

## 2020-04-26 MED FILL — DULoxetine HCL 20 MG CPEP: 20 | 30 days supply | Qty: 30 | Fill #0

## 2020-04-26 MED FILL — metFORMIN HCL 1000 MG TABS: 1000 | 30 days supply | Qty: 60 | Fill #2

## 2020-04-26 MED FILL — XARELTO 20 MG TABLET: 20 | 30 days supply | Qty: 30 | Fill #5

## 2020-04-26 MED FILL — LISINOPRIL 10 MG TABS: 10 | 30 days supply | Qty: 15 | Fill #2

## 2020-04-27 MED FILL — OZEMPIC 1 MG/DOSE SOPN: 2 | 28 days supply | Qty: 3 | Fill #6

## 2020-04-27 MED FILL — LANTUS SOLOSTAR 100 UNITS/M: 100 | 22 days supply | Qty: 30 | Fill #1

## 2020-05-01 MED FILL — ATORVASTATIN CALCIUM 40 MG: 40 | 30 days supply | Qty: 30 | Fill #2

## 2020-05-03 ENCOUNTER — Ambulatory Visit
Admission: RE | Admit: 2020-05-03 | Discharge: 2020-05-03 | Disposition: A | Discharge status: Home / Self Care | Payer: 59 | Source: Ambulatory Visit | Attending: Surgical | Admitting: Surgical

## 2020-05-03 ENCOUNTER — Other Ambulatory Visit: Payer: Self-pay

## 2020-05-03 DIAGNOSIS — M7542 Impingement syndrome of left shoulder: Secondary | ICD-10-CM

## 2020-05-03 DIAGNOSIS — M25511 Pain in right shoulder: Secondary | ICD-10-CM

## 2020-05-03 DIAGNOSIS — M7541 Impingement syndrome of right shoulder: Secondary | ICD-10-CM

## 2020-05-03 DIAGNOSIS — G8929 Other chronic pain: Secondary | ICD-10-CM

## 2020-05-03 MED ORDER — IOPAMIDOL (ISOVUE-M 200) INJECTION 41%
15.0000 mL | Freq: Once | INTRAMUSCULAR | Status: AC
  Administered 2020-05-03: 15 mL via INTRA_ARTICULAR

## 2020-05-07 ENCOUNTER — Ambulatory Visit
Admission: RE | Admit: 2020-05-07 | Discharge: 2020-05-07 | Disposition: A | Discharge status: Home / Self Care | Payer: 59 | Source: Ambulatory Visit | Attending: Surgical | Admitting: Surgical

## 2020-05-07 ENCOUNTER — Other Ambulatory Visit: Payer: Self-pay

## 2020-05-07 DIAGNOSIS — G8929 Other chronic pain: Secondary | ICD-10-CM

## 2020-05-07 DIAGNOSIS — M25512 Pain in left shoulder: Secondary | ICD-10-CM

## 2020-05-07 DIAGNOSIS — M7541 Impingement syndrome of right shoulder: Secondary | ICD-10-CM

## 2020-05-07 DIAGNOSIS — M7542 Impingement syndrome of left shoulder: Secondary | ICD-10-CM

## 2020-05-07 MED ORDER — IOPAMIDOL (ISOVUE-M 200) INJECTION 41%: 18.0000 mL | Freq: Once | INTRAMUSCULAR | Status: DC

## 2020-05-14 ENCOUNTER — Ambulatory Visit (INDEPENDENT_AMBULATORY_CARE_PROVIDER_SITE_OTHER): Payer: 59 | Admitting: Orthopedic Surgery

## 2020-05-14 DIAGNOSIS — M75111 Incomplete rotator cuff tear or rupture of right shoulder, not specified as traumatic: Secondary | ICD-10-CM

## 2020-05-14 DIAGNOSIS — M7541 Impingement syndrome of right shoulder: Secondary | ICD-10-CM | POA: Diagnosis not present

## 2020-05-16 ENCOUNTER — Other Ambulatory Visit: Payer: Self-pay

## 2020-05-17 ENCOUNTER — Encounter: Payer: Self-pay | Admitting: Orthopedic Surgery

## 2020-05-17 NOTE — Progress Notes (Signed)
Office Visit Note   Patient: Kelly Lang           Date of Birth: Oct 28, 1961           MRN: [id number] Visit Date: 05/14/2020 Requested by: Marcine Matar, MD [address] [place],  [place] [address] PCP: Marcine Matar, MD  Subjective: Chief Complaint  Patient presents with  . Follow-up    HPI: Kelly Lang is a 59 year old patient with bilateral shoulder pain.  MRI scan has been done.  On the right-hand side she has very deep bursal sided rotator cuff tear of the supraspinatus.  AC joint appears intact.  Mild changes in the labrum and biceps tendon.  Question of humeral avulsion of the glenohumeral ligaments but she has had no dislocation episodes.  She does report significant pain in the shoulder regions.  Difficult for her to sleep at night.  Denies any instability events.  Has had a history of right shoulder AC joint injections 2 years ago but this area is not particularly symptomatic today.  The pain is interfering with her sleep which is making it difficult for her to function.  She does have some social support at home.  She is on Xarelto but does not take any anti-inflammatories.  Has used topical Voltaren without much relief as well as Tylenol.  Does have a history of a stroke years ago.              ROS: All systems reviewed are negative as they relate to the chief complaint within the history of present illness.  Patient denies  fevers or chills.   Assessment & Plan: Visit Diagnoses:  1. Impingement syndrome of right shoulder   2. Nontraumatic incomplete tear of right rotator cuff     Plan: Impression is right shoulder pain refractory to nonoperative management.  MRI scan on the right-hand side does show essentially 90% plus thickness supraspinatus rotator cuff tendon tear.  AC joint is not particularly symptomatic today.  Plan is shoulder arthroscopy with possible biceps tendon release and tenodesis with rotator cuff repair.  Risk benefits are discussed with the patient  include not limited to infection nerve vessel damage shoulder stiffness as well as incomplete pain relief.  Patient understands the risk and benefits and wishes to proceed.  She will need to have her Xarelto managed preoperatively and perioperatively by her cardiologist whom she is seeing prior to surgery.  She did have an ablation in the past.  All questions answered.  Follow-Up Instructions: No follow-ups on file.   Orders:  No orders of the defined types were placed in this encounter.  No orders of the defined types were placed in this encounter.     Procedures: No procedures performed   Clinical Data: No additional findings.  Objective: Vital Signs: LMP 04/15/2015 (LMP Unknown) Comment: last in may  Physical Exam:   Constitutional: Patient appears well-developed HEENT:  Head: Normocephalic Eyes:EOM are normal Neck: Normal range of motion Cardiovascular: Normal rate Pulmonary/chest: Effort normal Neurologic: Patient is alert Skin: Skin is warm Psychiatric: Patient has normal mood and affect    Ortho Exam: Ortho exam demonstrates full active and passive range of motion of the shoulders but with pause impingement signs on the right and left hand side.  She has pretty good infraspinatus and subscap strength bilaterally.  No discrete AC joint tenderness or tenderness to palpation directly of the [facility] joint.  No pain with crossarm adduction on the right.  No restriction of passive  range of motion with external rotation on the right left-hand side.  She does have a little coarse grinding with internal X rotation at 90 degrees of abduction.  Specialty Comments:  No specialty comments available.  Imaging: No results found.   PMFS History: Patient Active Problem List   Diagnosis Date Noted  . Multinodular goiter 09/16/2019  . Chronic pain disorder 08/09/2018  . Diabetic polyneuropathy associated with type 2 diabetes mellitus ([place]) 08/09/2018  . Gastroparesis 12/21/2017  .  GI bleed 12/06/2017  . Hepatic steatosis 12/04/2017  . Chronic systolic CHF (congestive heart failure) ([place]) 09/16/2015  . Cerebrovascular accident (CVA) due to embolism of left middle cerebral artery ([place])   . Low back pain 04/03/2014  . Unspecified vitamin D deficiency 01/20/2014  . Essential hypertension, benign 01/20/2014  . Chronic anticoagulation 10/31/2013  . Tachycardia induced cardiomyopathy ([place])   . H/O: hypothyroidism   . Obesity   . Type 2 diabetes mellitus with diabetic polyneuropathy, with long-term current use of insulin ([place])   . Arthritis   . Dyslipidemia   . Atrial fibrillation  10/25/2013   Past Medical History:  Diagnosis Date  . Arthritis    "all over" (06/30/2017)  . Chicken pox   . Dyslipidemia   . H/O: hypothyroidism    a. thyroid biopsy 1996 with synthroid. Stopped taking rx and was loss to follow up b. normal thyroid function 10/20/13  . High cholesterol   . Hx of cardiovascular stress test    ETT/Lexiscan Myoview (12/2013):  No ischemia; not gated; low risk.  Marland Kitchen Hypertension   . Obesity   . Persistent atrial fibrillation ([place])    a diagnosed 10/25/2013  . Seasonal allergies   . Stroke [facility]) 2016   "some speech problems since" (06/30/2017)  . 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 2/2 Afib with RVR for unknown duration (EF: 40-45%)  . Thyroid disease   . Type II diabetes mellitus (H[facility])    a. hg A1c 10, newly diagnosed (10/26/13)    Family History  Adopted: Yes  Problem Relation Age of Onset  . Hypertension Mother   . Hyperlipidemia Mother     Past Surgical History:  Procedure Laterality Date  . ATRIAL FIBRILLATION ABLATION  06/30/2017  . ATRIAL FIBRILLATION ABLATION N/A 06/30/2017   Procedure: Atrial Fibrillation Ablation;  Surgeon: Thompson Grayer, MD;  Location: [facility];  Service: Cardiovascular;  Laterality: N/A;  . BIOPSY THYROID  1996  . CARDIOVERSION N/A 12/07/2013   Procedure: CARDIOVERSION;  Surgeon: Casandra Doffing, MD;   Location: [facility];  Service: Cardiovascular;  Laterality: N/A;  . CARDIOVERSION N/A 03/10/2016   Procedure: CARDIOVERSION;  Surgeon: Thayer Headings, MD;  Location: [facility] ENDOSCOPY;  Service: Cardiovascular;  Laterality: N/A;  . ESOPHAGOGASTRODUODENOSCOPY N/A 12/07/2017   Procedure: ESOPHAGOGASTRODUODENOSCOPY (EGD);  Surgeon: Irene Shipper, MD;  Location[facility] ENDOSCOPY;  Service: Endoscopy;  Laterality: N/A;  . EYE MUSCLE SURGERY Right ~ 1966  . TEE WITHOUT CARDIOVERSION N/A 06/29/2017   Procedure: TRANSESOPHAGEAL ECHOCARDIOGRAM (TEE);  Surgeon: Fay Records, MD;  Location: [facility];  Service: Cardiovascular;  Laterality: N/A;  . TONSILLECTOMY  1971   Social History   Occupational History  . Occupation: Chief Technology Officer  Tobacco Use  . Smoking status: Former Smoker    Packs/day: 1.00    Years: 22.00    Pack years: 22.00    Types: Cigarettes    Quit date: 12/01/2010    Years since quitting: 9.4  . Smokeless tobacco: Never  Used  Vaping Use  . Vaping Use: Never used  Substance and Sexual Activity  . Alcohol use: Yes    Comment: 06/30/2017 "glass of wine maybe q other month"  . Drug use: No  . Sexual activity: Not Currently

## 2020-05-18 ENCOUNTER — Telehealth: Payer: Self-pay | Admitting: Cardiovascular Disease

## 2020-05-18 ENCOUNTER — Encounter (HOSPITAL_BASED_OUTPATIENT_CLINIC_OR_DEPARTMENT_OTHER): Payer: Self-pay | Admitting: Orthopedic Surgery

## 2020-05-18 ENCOUNTER — Other Ambulatory Visit: Payer: Self-pay

## 2020-05-18 NOTE — Telephone Encounter (Signed)
Please comment on xarelto. 

## 2020-05-18 NOTE — Telephone Encounter (Signed)
° ° °  ° °   [facility] Pre-operative Risk Assessment    HEARTCARE STAFF: - Please ensure there is not already an duplicate clearance open for this procedure. - Under Visit Info/Reason for Call, type in Other and utilize the format Clearance MM/DD/YY or Clearance TBD. Do not use dashes or single digits. - If request is for dental extraction, please clarify the # of teeth to be extracted.  Request for surgical clearance:  1. What type of surgery is being performed? Right shoulder arthroscopy rotator cuff tear repair  2. When is this surgery scheduled? 05/24/20  3. What type of clearance is required (medical clearance vs. Pharmacy clearance to hold med vs. Both)? Pharmacy  4. Are there any medications that need to be held prior to surgery and how long? Xarelto  5. Practice name and name of physician performing surgery? Dr. Meredith Pel - [facility] in [place]  6. What is the office phone number? [phone]   7.   What is the office fax number? [phone]  8.   Anesthesia type (None, local, MAC, general) ? General   Kelly Lang 05/18/2020, 4:22 PM  _________________________________________________________________   (provider comments below)

## 2020-05-21 ENCOUNTER — Other Ambulatory Visit (HOSPITAL_COMMUNITY)
Admission: RE | Admit: 2020-05-21 | Discharge: 2020-05-21 | Disposition: A | Discharge status: Home / Self Care | Payer: 59 | Source: Ambulatory Visit | Attending: Orthopedic Surgery | Admitting: Orthopedic Surgery

## 2020-05-21 ENCOUNTER — Encounter (HOSPITAL_BASED_OUTPATIENT_CLINIC_OR_DEPARTMENT_OTHER)
Admission: RE | Admit: 2020-05-21 | Discharge: 2020-05-21 | Disposition: A | Discharge status: Home / Self Care | Payer: 59 | Source: Ambulatory Visit | Attending: Orthopedic Surgery | Admitting: Orthopedic Surgery

## 2020-05-21 DIAGNOSIS — Z01812 Encounter for preprocedural laboratory examination: Secondary | ICD-10-CM | POA: Insufficient documentation

## 2020-05-21 DIAGNOSIS — Z20822 Contact with and (suspected) exposure to covid-19: Secondary | ICD-10-CM | POA: Insufficient documentation

## 2020-05-21 LAB — CBC
HCT: 43.9 % (ref 36.0–46.0)
Hemoglobin: 14.6 g/dL (ref 12.0–15.0)
MCH: 32.5 pg (ref 26.0–34.0)
MCHC: 33.3 g/dL (ref 30.0–36.0)
MCV: 97.8 fL (ref 80.0–100.0)
Platelets: 258 10*3/uL (ref 150–400)
RBC: 4.49 MIL/uL (ref 3.87–5.11)
RDW: 12.8 % (ref 11.5–15.5)
WBC: 8 10*3/uL (ref 4.0–10.5)
nRBC: 0 % (ref 0.0–0.2)

## 2020-05-21 LAB — BASIC METABOLIC PANEL
Anion gap: 10 (ref 5–15)
BUN: 14 mg/dL (ref 6–20)
CO2: 25 mmol/L (ref 22–32)
Calcium: 9.2 mg/dL (ref 8.9–10.3)
Chloride: 104 mmol/L (ref 98–111)
Creatinine, Ser: 0.74 mg/dL (ref 0.44–1.00)
GFR calc Af Amer: >60 mL/min (ref >60)
GFR calc non Af Amer: >60 mL/min (ref >60)
Glucose, Bld: 155 mg/dL — ABNORMAL HIGH (ref 70–99)
Potassium: 3.9 mmol/L (ref 3.5–5.1)
Sodium: 139 mmol/L (ref 135–145)

## 2020-05-21 LAB — SARS CORONAVIRUS 2 (TAT 6-24 HRS): SARS Coronavirus 2: NEGATIVE

## 2020-05-21 NOTE — Progress Notes (Signed)

## 2020-05-21 NOTE — Telephone Encounter (Signed)
Patient with diagnosis of A Fib on Xarelto for anticoagulation.    Procedure: Right shoulder arthroscopy rotator cuff tear repair  Date of procedure: 05/24/20  CHADS2-VASc score of  6 (CHF, HTN,  DM2, stroke/tia x 2,  Female)  Stroke in October 2016  CrCl 106 mL/min  Patient high risk off anticoagulation due to history of stroke, CHADS2-VASc of 6, and uncontrolled diabetes.  Will forward to MD for input on length of anticoag hold

## 2020-05-21 NOTE — Telephone Encounter (Signed)
   Primary Cardiologist: Kristeen Miss, MD  Chart reviewed as part of pre-operative protocol coverage. She was doing well on cardiac stand point when last seen by Graciella Freer, PAC. Given past medical history and time since last visit, based on ACC/AHA guidelines, Saleen Peden would be at acceptable risk for the planned procedure without further cardiovascular testing.   Wait MD  Recommendations regarding anticoagulation.    [place], [place] 05/21/2020, 11:20 AM

## 2020-05-21 NOTE — Telephone Encounter (Signed)
I have personally gave recommendations to the patient and will fax clearance form to provider.   Manson Passey, PA-C

## 2020-05-21 NOTE — Telephone Encounter (Signed)
Patient is calling to follow up on whether she needs to stop her xarelto.

## 2020-05-21 NOTE — Telephone Encounter (Signed)
Pt is at low risk for ortho surgery  She may hold her Xarelto for 2 days prior to her shoulder surgery .     Kristeen Miss, MD  05/21/2020 3:08 PM    [facility] [address] [place],  [address] [place], [place]  [address] Phone: [phone]; Fax: [phone]

## 2020-05-24 ENCOUNTER — Other Ambulatory Visit: Payer: Self-pay

## 2020-05-24 ENCOUNTER — Ambulatory Visit (HOSPITAL_BASED_OUTPATIENT_CLINIC_OR_DEPARTMENT_OTHER): Payer: 59 | Admitting: Anesthesiology

## 2020-05-24 ENCOUNTER — Encounter: Payer: Self-pay | Admitting: Orthopaedic Surgery

## 2020-05-24 ENCOUNTER — Encounter (HOSPITAL_BASED_OUTPATIENT_CLINIC_OR_DEPARTMENT_OTHER): Payer: Self-pay | Admitting: Orthopedic Surgery

## 2020-05-24 ENCOUNTER — Encounter: Payer: Self-pay | Admitting: Orthopedic Surgery

## 2020-05-24 ENCOUNTER — Encounter (HOSPITAL_BASED_OUTPATIENT_CLINIC_OR_DEPARTMENT_OTHER)
Admission: RE | Disposition: A | Discharge status: Home / Self Care | Payer: Self-pay | Source: Home / Self Care | Attending: Orthopedic Surgery

## 2020-05-24 ENCOUNTER — Ambulatory Visit (HOSPITAL_BASED_OUTPATIENT_CLINIC_OR_DEPARTMENT_OTHER)
Admission: RE | Admit: 2020-05-24 | Discharge: 2020-05-24 | Disposition: A | Discharge status: Home / Self Care | Payer: 59 | Attending: Orthopedic Surgery | Admitting: Orthopedic Surgery

## 2020-05-24 DIAGNOSIS — Z794 Long term (current) use of insulin: Secondary | ICD-10-CM | POA: Insufficient documentation

## 2020-05-24 DIAGNOSIS — Z7901 Long term (current) use of anticoagulants: Secondary | ICD-10-CM | POA: Diagnosis not present

## 2020-05-24 DIAGNOSIS — Z6841 Body Mass Index (BMI) 40.0 and over, adult: Secondary | ICD-10-CM | POA: Insufficient documentation

## 2020-05-24 DIAGNOSIS — Z8673 Personal history of transient ischemic attack (TIA), and cerebral infarction without residual deficits: Secondary | ICD-10-CM | POA: Diagnosis not present

## 2020-05-24 DIAGNOSIS — E785 Hyperlipidemia, unspecified: Secondary | ICD-10-CM | POA: Diagnosis not present

## 2020-05-24 DIAGNOSIS — I428 Other cardiomyopathies: Secondary | ICD-10-CM | POA: Insufficient documentation

## 2020-05-24 DIAGNOSIS — E039 Hypothyroidism, unspecified: Secondary | ICD-10-CM | POA: Diagnosis not present

## 2020-05-24 DIAGNOSIS — Z87891 Personal history of nicotine dependence: Secondary | ICD-10-CM | POA: Diagnosis not present

## 2020-05-24 DIAGNOSIS — Z791 Long term (current) use of non-steroidal anti-inflammatories (NSAID): Secondary | ICD-10-CM | POA: Diagnosis not present

## 2020-05-24 DIAGNOSIS — E669 Obesity, unspecified: Secondary | ICD-10-CM | POA: Diagnosis not present

## 2020-05-24 DIAGNOSIS — E119 Type 2 diabetes mellitus without complications: Secondary | ICD-10-CM | POA: Insufficient documentation

## 2020-05-24 DIAGNOSIS — M75101 Unspecified rotator cuff tear or rupture of right shoulder, not specified as traumatic: Secondary | ICD-10-CM | POA: Insufficient documentation

## 2020-05-24 DIAGNOSIS — M75121 Complete rotator cuff tear or rupture of right shoulder, not specified as traumatic: Secondary | ICD-10-CM | POA: Diagnosis not present

## 2020-05-24 DIAGNOSIS — Z79899 Other long term (current) drug therapy: Secondary | ICD-10-CM | POA: Insufficient documentation

## 2020-05-24 DIAGNOSIS — M1990 Unspecified osteoarthritis, unspecified site: Secondary | ICD-10-CM | POA: Insufficient documentation

## 2020-05-24 DIAGNOSIS — S43431D Superior glenoid labrum lesion of right shoulder, subsequent encounter: Secondary | ICD-10-CM | POA: Diagnosis not present

## 2020-05-24 DIAGNOSIS — Z888 Allergy status to other drugs, medicaments and biological substances status: Secondary | ICD-10-CM | POA: Diagnosis not present

## 2020-05-24 DIAGNOSIS — Z8349 Family history of other endocrine, nutritional and metabolic diseases: Secondary | ICD-10-CM | POA: Diagnosis not present

## 2020-05-24 DIAGNOSIS — K219 Gastro-esophageal reflux disease without esophagitis: Secondary | ICD-10-CM | POA: Insufficient documentation

## 2020-05-24 DIAGNOSIS — Z8249 Family history of ischemic heart disease and other diseases of the circulatory system: Secondary | ICD-10-CM | POA: Diagnosis not present

## 2020-05-24 DIAGNOSIS — E7800 Pure hypercholesterolemia, unspecified: Secondary | ICD-10-CM | POA: Insufficient documentation

## 2020-05-24 DIAGNOSIS — I10 Essential (primary) hypertension: Secondary | ICD-10-CM | POA: Diagnosis not present

## 2020-05-24 DIAGNOSIS — I4819 Other persistent atrial fibrillation: Secondary | ICD-10-CM | POA: Insufficient documentation

## 2020-05-24 DIAGNOSIS — L409 Psoriasis, unspecified: Secondary | ICD-10-CM | POA: Insufficient documentation

## 2020-05-24 DIAGNOSIS — Z885 Allergy status to narcotic agent status: Secondary | ICD-10-CM | POA: Insufficient documentation

## 2020-05-24 HISTORY — PX: SHOULDER ARTHROSCOPY WITH ROTATOR CUFF REPAIR: SHX5685

## 2020-05-24 HISTORY — DX: Psoriasis, unspecified: L40.9

## 2020-05-24 HISTORY — DX: Cardiac arrhythmia, unspecified: I49.9

## 2020-05-24 HISTORY — DX: Gastro-esophageal reflux disease without esophagitis: K21.9

## 2020-05-24 HISTORY — PX: BICEPT TENODESIS: SHX5116

## 2020-05-24 LAB — GLUCOSE, CAPILLARY
Glucose-Capillary: 128 mg/dL — ABNORMAL HIGH (ref 70–99)
Glucose-Capillary: 152 mg/dL — ABNORMAL HIGH (ref 70–99)

## 2020-05-24 SURGERY — ARTHROSCOPY, SHOULDER, WITH ROTATOR CUFF REPAIR
Anesthesia: General | Site: Shoulder | Laterality: Right

## 2020-05-24 MED ORDER — FENTANYL CITRATE (PF) 100 MCG/2ML IJ SOLN
100.0000 ug | Freq: Once | INTRAMUSCULAR | Status: AC
  Administered 2020-05-24: 100 ug via INTRAVENOUS

## 2020-05-24 MED ORDER — BUPIVACAINE-EPINEPHRINE (PF) 0.5% -1:200000 IJ SOLN
INTRAMUSCULAR | Status: DC | PRN
  Administered 2020-05-24: 20 mL via PERINEURAL

## 2020-05-24 MED ORDER — ROCURONIUM BROMIDE 100 MG/10ML IV SOLN
INTRAVENOUS | Status: DC | PRN
  Administered 2020-05-24: 100 mg via INTRAVENOUS

## 2020-05-24 MED ORDER — DEXAMETHASONE SODIUM PHOSPHATE 4 MG/ML IJ SOLN
INTRAMUSCULAR | Status: DC | PRN
  Administered 2020-05-24: 5 mg via INTRAVENOUS

## 2020-05-24 MED ORDER — METHOCARBAMOL 500 MG PO TABS: 500.0000 mg | ORAL_TABLET | Freq: Three times a day (TID) | ORAL | 0 refills | Status: DC | PRN

## 2020-05-24 MED ORDER — LACTATED RINGERS IV SOLN: INTRAVENOUS | Status: DC

## 2020-05-24 MED ORDER — ONDANSETRON HCL 4 MG/2ML IJ SOLN: 4.0000 mg | Freq: Once | INTRAMUSCULAR | Status: DC | PRN

## 2020-05-24 MED ORDER — MIDAZOLAM HCL 2 MG/2ML IJ SOLN
2.0000 mg | Freq: Once | INTRAMUSCULAR | Status: AC
  Administered 2020-05-24: 2 mg via INTRAVENOUS

## 2020-05-24 MED ORDER — ONDANSETRON HCL 4 MG/2ML IJ SOLN
INTRAMUSCULAR | Status: DC | PRN
  Administered 2020-05-24: 4 mg via INTRAVENOUS

## 2020-05-24 MED ORDER — SUGAMMADEX SODIUM 200 MG/2ML IV SOLN
INTRAVENOUS | Status: DC | PRN
Start: 2020-05-24 — End: 2020-05-24
  Administered 2020-05-24: 200 mg via INTRAVENOUS

## 2020-05-24 MED ORDER — LIDOCAINE HCL (CARDIAC) PF 100 MG/5ML IV SOSY
PREFILLED_SYRINGE | INTRAVENOUS | Status: DC | PRN
  Administered 2020-05-24: 200 mg via INTRAVENOUS

## 2020-05-24 MED ORDER — PHENYLEPHRINE HCL (PRESSORS) 10 MG/ML IV SOLN
INTRAVENOUS | Status: DC | PRN
  Administered 2020-05-24 (×4): 80 ug via INTRAVENOUS
  Administered 2020-05-24: 120 ug via INTRAVENOUS
  Administered 2020-05-24: 80 ug via INTRAVENOUS
  Administered 2020-05-24: 120 ug via INTRAVENOUS

## 2020-05-24 MED ORDER — POVIDONE-IODINE 7.5 % EX SOLN: Freq: Once | CUTANEOUS | Status: DC

## 2020-05-24 MED ORDER — FENTANYL CITRATE (PF) 100 MCG/2ML IJ SOLN
INTRAMUSCULAR | Status: AC
  Filled 2020-05-24: qty 2

## 2020-05-24 MED ORDER — EPHEDRINE SULFATE 50 MG/ML IJ SOLN
INTRAMUSCULAR | Status: DC | PRN
  Administered 2020-05-24: 15 mg via INTRAVENOUS
  Administered 2020-05-24: 5 mg via INTRAVENOUS
  Administered 2020-05-24: 10 mg via INTRAVENOUS

## 2020-05-24 MED ORDER — PROPOFOL 10 MG/ML IV BOLUS
INTRAVENOUS | Status: DC | PRN
Start: 2020-05-24 — End: 2020-05-24
  Administered 2020-05-24: 100 mg via INTRAVENOUS
  Administered 2020-05-24: 200 mg via INTRAVENOUS

## 2020-05-24 MED ORDER — MIDAZOLAM HCL 2 MG/2ML IJ SOLN
INTRAMUSCULAR | Status: AC
  Filled 2020-05-24: qty 2

## 2020-05-24 MED ORDER — LIDOCAINE 2% (20 MG/ML) 5 ML SYRINGE
INTRAMUSCULAR | Status: AC
  Filled 2020-05-24: qty 5

## 2020-05-24 MED ORDER — OXYCODONE-ACETAMINOPHEN 5-325 MG PO TABS: 1.0000 | ORAL_TABLET | ORAL | 0 refills | Status: DC | PRN

## 2020-05-24 MED ORDER — POVIDONE-IODINE 10 % EX SWAB: 2.0000 "application " | Freq: Once | CUTANEOUS | Status: DC

## 2020-05-24 MED ORDER — BUPIVACAINE LIPOSOME 1.3 % IJ SUSP
INTRAMUSCULAR | Status: DC | PRN
  Administered 2020-05-24: 10 mL via PERINEURAL

## 2020-05-24 MED ORDER — CEFAZOLIN SODIUM-DEXTROSE 2-4 GM/100ML-% IV SOLN
INTRAVENOUS | Status: AC
  Filled 2020-05-24: qty 100

## 2020-05-24 MED ORDER — MEPERIDINE HCL 25 MG/ML IJ SOLN: 6.2500 mg | INTRAMUSCULAR | Status: DC | PRN

## 2020-05-24 MED ORDER — CEFAZOLIN SODIUM-DEXTROSE 2-4 GM/100ML-% IV SOLN
2.0000 g | INTRAVENOUS | Status: AC
  Administered 2020-05-24: 2 g via INTRAVENOUS

## 2020-05-24 MED ORDER — FENTANYL CITRATE (PF) 100 MCG/2ML IJ SOLN
INTRAMUSCULAR | Status: DC | PRN
  Administered 2020-05-24: 100 ug via INTRAVENOUS

## 2020-05-24 MED ORDER — PROPOFOL 10 MG/ML IV BOLUS
INTRAVENOUS | Status: AC
  Filled 2020-05-24: qty 40

## 2020-05-24 MED ORDER — ROCURONIUM BROMIDE 10 MG/ML (PF) SYRINGE
PREFILLED_SYRINGE | INTRAVENOUS | Status: AC
  Filled 2020-05-24: qty 10

## 2020-05-24 MED ORDER — HYDROMORPHONE HCL 1 MG/ML IJ SOLN: 0.2500 mg | INTRAMUSCULAR | Status: DC | PRN

## 2020-05-24 SURGICAL SUPPLY — 91 items
ANCHOR FBRTK 2.6 SUTURETAP 1.3 (Anchor) ×4 IMPLANT
ANCHOR SUT 1.8 FBRTK KNTLS 2SU (Anchor) ×4 IMPLANT
ANCHOR SUT BIO SW 4.75X19.1 (Anchor) ×4 IMPLANT
BLADE EXCALIBUR 4.0X13 (MISCELLANEOUS) IMPLANT
BLADE SHAVER BONE 5.0X13 (MISCELLANEOUS) IMPLANT
BLADE SURG 10 STRL SS (BLADE) IMPLANT
BLADE SURG 15 STRL LF DISP TIS (BLADE) ×1 IMPLANT
BLADE SURG 15 STRL SS (BLADE) ×2
BURR OVAL 8 FLU 5.0X13 (MISCELLANEOUS) IMPLANT
CANNULA 5.75X71 LONG (CANNULA) IMPLANT
CANNULA TWIST IN 8.25X7CM (CANNULA) IMPLANT
CLEANER CAUTERY TIP 5X5 PAD (MISCELLANEOUS) IMPLANT
COVER WAND RF STERILE (DRAPES) IMPLANT
DECANTER SPIKE VIAL GLASS SM (MISCELLANEOUS) IMPLANT
DISSECTOR  3.8MM X 13CM (MISCELLANEOUS)
DISSECTOR 3.8MM X 13CM (MISCELLANEOUS) IMPLANT
DISSECTOR 4.0MM X 13CM (MISCELLANEOUS) ×2 IMPLANT
DRAPE IMP U-DRAPE 54X76 (DRAPES) ×2 IMPLANT
DRAPE INCISE IOBAN 66X45 STRL (DRAPES) ×2 IMPLANT
DRAPE STERI 35X30 U-POUCH (DRAPES) ×2 IMPLANT
DRAPE U-SHAPE 47X51 STRL (DRAPES) ×2 IMPLANT
DRAPE U-SHAPE 76X120 STRL (DRAPES) ×4 IMPLANT
DRSG AQUACEL AG ADV 3.5X 6 (GAUZE/BANDAGES/DRESSINGS) IMPLANT
DRSG TEGADERM 4X4.75 (GAUZE/BANDAGES/DRESSINGS) ×6 IMPLANT
DURAPREP 26ML APPLICATOR (WOUND CARE) ×2 IMPLANT
DW OUTFLOW CASSETTE/TUBE SET (MISCELLANEOUS) IMPLANT
ELECT NEEDLE TIP 2.8 STRL (NEEDLE) ×2 IMPLANT
ELECT REM PT RETURN 9FT ADLT (ELECTROSURGICAL) ×2
ELECTRODE REM PT RTRN 9FT ADLT (ELECTROSURGICAL) ×1 IMPLANT
EXCALIBUR 3.8MM X 13CM (MISCELLANEOUS) IMPLANT
GAUZE SPONGE 4X4 12PLY STRL (GAUZE/BANDAGES/DRESSINGS) ×2 IMPLANT
GAUZE XEROFORM 1X8 LF (GAUZE/BANDAGES/DRESSINGS) ×2 IMPLANT
GLOVE BIO SURGEON STRL SZ7 (GLOVE) ×2 IMPLANT
GLOVE BIOGEL PI IND STRL 7.0 (GLOVE) ×4 IMPLANT
GLOVE BIOGEL PI IND STRL 8 (GLOVE) ×1 IMPLANT
GLOVE BIOGEL PI INDICATOR 7.0 (GLOVE) ×4
GLOVE BIOGEL PI INDICATOR 8 (GLOVE) ×1
GLOVE ECLIPSE 6.5 STRL STRAW (GLOVE) ×4 IMPLANT
GLOVE SURG ORTHO 8.0 STRL STRW (GLOVE) ×2 IMPLANT
GOWN STRL REUS W/ TWL LRG LVL3 (GOWN DISPOSABLE) ×3 IMPLANT
GOWN STRL REUS W/ TWL XL LVL3 (GOWN DISPOSABLE) ×1 IMPLANT
GOWN STRL REUS W/TWL LRG LVL3 (GOWN DISPOSABLE) ×6
GOWN STRL REUS W/TWL XL LVL3 (GOWN DISPOSABLE) ×2
KIT BIO-TENODESIS 3X8 DISP (MISCELLANEOUS)
KIT INSRT BABSR STRL DISP BTN (MISCELLANEOUS) IMPLANT
KIT STR SPEAR 1.8 FBRTK DISP (KITS) ×2 IMPLANT
MANIFOLD NEPTUNE II (INSTRUMENTS) ×2 IMPLANT
NDL SAFETY ECLIPSE 18X1.5 (NEEDLE) ×2 IMPLANT
NDL SUT 6 .5 CRC .975X.05 MAYO (NEEDLE) IMPLANT
NEEDLE HYPO 18GX1.5 SHARP (NEEDLE) ×4
NEEDLE MAYO TAPER (NEEDLE)
NEEDLE SCORPION MULTI FIRE (NEEDLE) ×2 IMPLANT
NEEDLE SPNL 18GX3.5 QUINCKE PK (NEEDLE) IMPLANT
NS IRRIG 1000ML POUR BTL (IV SOLUTION) IMPLANT
PACK DSU ARTHROSCOPY (CUSTOM PROCEDURE TRAY) ×2 IMPLANT
PAD CLEANER CAUTERY TIP 5X5 (MISCELLANEOUS)
PENCIL SMOKE EVACUATOR (MISCELLANEOUS) ×2 IMPLANT
PORT APPOLLO RF 90DEGREE MULTI (SURGICAL WAND) ×2 IMPLANT
RESTRAINT HEAD UNIVERSAL NS (MISCELLANEOUS) ×2 IMPLANT
SET BASIN DAY SURGERY F.S. (CUSTOM PROCEDURE TRAY) ×2 IMPLANT
SHEET MEDIUM DRAPE 40X70 STRL (DRAPES) IMPLANT
SLEEVE SCD COMPRESS KNEE MED (MISCELLANEOUS) ×2 IMPLANT
SLING ARM FOAM STRAP LRG (SOFTGOODS) IMPLANT
SPONGE LAP 18X18 RF (DISPOSABLE) ×2 IMPLANT
SPONGE LAP 4X18 RFD (DISPOSABLE) IMPLANT
SPONGE SURGIFOAM ABS GEL 12-7 (HEMOSTASIS) IMPLANT
STAPLER VISISTAT 35W (STAPLE) ×2 IMPLANT
SUCTION FRAZIER HANDLE 10FR (MISCELLANEOUS) ×2
SUCTION TUBE FRAZIER 10FR DISP (MISCELLANEOUS) ×1 IMPLANT
SUT BONE WAX W31G (SUTURE) IMPLANT
SUT ETHIBOND 2 OS 4 DA (SUTURE) IMPLANT
SUT ETHILON 3 0 PS 1 (SUTURE) ×2 IMPLANT
SUT FIBERWIRE #2 38 T-5 BLUE (SUTURE)
SUT FIBERWIRE 2-0 18 17.9 3/8 (SUTURE)
SUT MNCRL AB 3-0 PS2 18 (SUTURE) ×2 IMPLANT
SUT PDS AB 0 CT 36 (SUTURE) ×2 IMPLANT
SUT TICRON 1 T 12 (SUTURE) IMPLANT
SUT VIC AB 1 CT1 27 (SUTURE) ×4
SUT VIC AB 1 CT1 27XBRD ANBCTR (SUTURE) ×2 IMPLANT
SUT VIC AB 2-0 SH 27 (SUTURE) ×2
SUT VIC AB 2-0 SH 27XBRD (SUTURE) ×1 IMPLANT
SUT VICRYL 0 UR6 27IN ABS (SUTURE) ×6 IMPLANT
SUT VICRYL 4-0 PS2 18IN ABS (SUTURE) ×2 IMPLANT
SUTURE FIBERWR #2 38 T-5 BLUE (SUTURE) IMPLANT
SUTURE FIBERWR 2-0 18 17.9 3/8 (SUTURE) IMPLANT
SYR 5ML LL (SYRINGE) ×2 IMPLANT
TOWEL GREEN STERILE FF (TOWEL DISPOSABLE) ×4 IMPLANT
TRAY DSU PREP LF (CUSTOM PROCEDURE TRAY) ×2 IMPLANT
TUBE CONNECTING 20X1/4 (TUBING) ×2 IMPLANT
TUBING ARTHROSCOPY IRRIG 16FT (MISCELLANEOUS) ×2 IMPLANT
YANKAUER SUCT BULB TIP NO VENT (SUCTIONS) ×2 IMPLANT

## 2020-05-24 NOTE — H&P (Signed)
Kelly Lang is an 59 y.o. female.   Chief Complaint: Right shoulder pain HPI: Kelly Lang is a 59 year old patient with longstanding right shoulder pain.  MRI scan shows 90% thickness tear of the supraspinatus.  She has failed nonoperative management as well as rehabilitation.  Reports daily symptoms as well as weakness and pain in that right shoulder.  Presents now for operative management after failure of conservative management and explanation risk benefits.  Her Xarelto has been managed preoperatively by her cardiologist.  Past Medical History:  Diagnosis Date  . Arthritis    "all over" (06/30/2017)  . Chicken pox   . Dyslipidemia   . Dysrhythmia ablation 2018   a-fib  . GERD (gastroesophageal reflux disease)   . H/O: hypothyroidism    a. thyroid biopsy 1996 with synthroid. Stopped taking rx and was loss to follow up b. normal thyroid function 10/20/13  . High cholesterol   . Hx of cardiovascular stress test    ETT/Lexiscan Myoview (12/2013):  No ischemia; not gated; low risk.  Kelly Lang Kitchen Hypertension   . Obesity   . Persistent atrial fibrillation ([place])    a diagnosed 10/25/2013  . Psoriasis    on [place]  . Seasonal allergies   . Stroke [facility]) 2016   "some speech problems since" (06/30/2017)  . 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 2/2 Afib with RVR for unknown duration (EF: 40-45%)  . Thyroid disease   . Type II diabetes mellitus ([place])    a. hg A1c 10, newly diagnosed (10/26/13)    Past Surgical History:  Procedure Laterality Date  . ATRIAL FIBRILLATION ABLATION  06/30/2017  . ATRIAL FIBRILLATION ABLATION N/A 06/30/2017   Procedure: Atrial Fibrillation Ablation;  Surgeon: Thompson Grayer, MD;  Location: [facility];  Service: Cardiovascular;  Laterality: N/A;  . BIOPSY THYROID  1996  . CARDIOVERSION N/A 12/07/2013   Procedure: CARDIOVERSION;  Surgeon: Casandra Doffing, MD;  Location: [facility];  Service: Cardiovascular;  Laterality: N/A;  . CARDIOVERSION N/A 03/10/2016    Procedure: CARDIOVERSION;  Surgeon: Thayer Headings, MD;  Location: [facility] ENDOSCOPY;  Service: Cardiovascular;  Laterality: N/A;  . ESOPHAGOGASTRODUODENOSCOPY N/A 12/07/2017   Procedure: ESOPHAGOGASTRODUODENOSCOPY (EGD);  Surgeon: Irene Shipper, MD;  Location: [facility] ENDOSCOPY;  Service: Endoscopy;  Laterality: N/A;  . EYE MUSCLE SURGERY Right ~ 1966  . TEE WITHOUT CARDIOVERSION N/A 06/29/2017   Procedure: TRANSESOPHAGEAL ECHOCARDIOGRAM (TEE);  Surgeon: Fay Records, MD;  Location: [facility] ENDOSCOPY;  Service: Cardiovascular;  Laterality: N/A;  . TONSILLECTOMY  1971    Family History  Adopted: Yes  Problem Relation Age of Onset  . Hypertension Mother   . Hyperlipidemia Mother    Social History:  reports that she quit smoking about 9 years ago. Her smoking use included cigarettes. She has a 22.00 pack-year smoking history. She has never used smokeless tobacco. She reports current alcohol use. She reports that she does not use drugs.  Allergies:  Allergies  Allergen Reactions  . Jardiance [Empagliflozin] Other (See Comments)    Yeast infections  . Codeine Itching and Rash    Medications Prior to Admission  Medication Sig Dispense Refill  . atorvastatin (LIPITOR) 40 MG tablet TAKE 1 TABLET (40 MG TOTAL) BY MOUTH DAILY. 30 tablet 2  . betamethasone dipropionate 0.05 % cream APPLY 1 APPLICATION OF CREAM TWICE DAILY AS NEEDED FOR FLARES    . diclofenac sodium (VOLTAREN) 1 % GEL APPLY 2 GRAMS TOPICALLY 4 (FOUR) TIMES DAILY AS NEEDED (PAIN). 100 g 5  .  DULoxetine (CYMBALTA) 20 MG capsule TAKE 1 CAPSULE (20 MG TOTAL) BY MOUTH DAILY. 30 capsule 2  . glipiZIDE (GLUCOTROL) 5 MG tablet Take 0.5 tablets (2.5 mg total) by mouth daily before breakfast. 60 tablet 3  . insulin glargine (LANTUS SOLOSTAR) 100 UNIT/ML Solostar Pen Inject 130 Units into the skin every morning. 15 pen 11  . lisinopril (ZESTRIL) 10 MG tablet TAKE 1/2 TABLET BY MOUTH ONCE DAILY 15 tablet 2  . metFORMIN (GLUCOPHAGE) 1000 MG tablet TAKE 1  TABLET (1,000 MG TOTAL) BY MOUTH 2 (TWO) TIMES DAILY WITH A MEAL. 60 tablet 3  . metoprolol tartrate (LOPRESSOR) 25 MG tablet TAKE 1 & 1/2 TABLET BY MOUTH 2 TIMES DAILY 270 tablet 2  . metroNIDAZOLE (METROCREAM) 0.75 % cream APPLY THIN LAYER TO FACE TWICE DAILY    . Semaglutide, 1 MG/DOSE, (OZEMPIC, 1 MG/DOSE,) 2 MG/1.5ML SOPN Inject 1 mg into the skin once a week. 2 pen 11  . SKYRIZI, 150 MG DOSE, 75 MG/0.83ML PSKT Inject 150 mg as directed See admin instructions. 4x's per year    . XARELTO 20 MG TABS tablet TAKE 1 TABLET BY MOUTH DAILY WITH SUPPER. 30 tablet 10  . Blood Glucose Monitoring Suppl ([employer]) w/Device KIT Use as instructed to check blood sugar up to 3 times daily. 1 kit 0  . Insulin Pen Needle (ULTICARE MICRO PEN NEEDLES) 32G X 4 MM MISC 1 applicator by Does not apply route at bedtime. 100 each 2  . ONETOUCH DELICA LANCETS 16X MISC Use as instructed to check blood sugar up to 3 times daily. 100 each 11  . ONETOUCH VERIO test strip USE AS DIRECTED UP TO THREE TIMES DAILY TO  TEST  BLOOD  SUGAR 100 each 11    Results for orders placed or performed during the hospital encounter of 05/24/20 (from the past 48 hour(s))  Glucose, capillary     Status: Abnormal   Collection Time: 05/24/20 11:02 AM  Result Value Ref Range   Glucose-Capillary 128 (H) 70 - 99 mg/dL    Comment: Glucose reference range applies only to samples taken after fasting for at least 8 hours.   No results found.  Review of Systems  Musculoskeletal: Positive for arthralgias.  All other systems reviewed and are negative.   Blood pressure 111/77, pulse 73, temperature 98.6 F (37 C), temperature source Oral, resp. rate 16, height '5\' 5"'  (1.651 m), weight 111.7 kg, last menstrual period 04/15/2015, SpO2 100 %. Physical Exam  Vitals reviewed. HENT:  Head: Normocephalic.  Nose: Nose normal.  Mouth/Throat: Mucous membranes are moist.  Eyes: Pupils are equal, round, and reactive to light.   Cardiovascular: Normal rate and normal pulses.  Respiratory: Effort normal.  Musculoskeletal:     Cervical back: Normal range of motion.  Neurological: She is alert.  Skin: Skin is warm. Capillary refill takes less than 2 seconds.  Psychiatric: Mood normal.  Examination of the right shoulder demonstrates positive impingement signs but no discrete AC joint tenderness right versus left.  Does have a little bit of supraspinatus weakness on exam to the right compared to the left.  Subscap infraspinatus strength is 5+ out of 5 bilaterally.  No restriction of external rotation of 15 degrees of abduction bilaterally.  No other masses lymphadenopathy or skin changes noted in that shoulder region.  Assessment/Plan Impression is right shoulder rotator cuff tear possible biceps tendon pathology.  Plan is right shoulder arthroscopy with biceps tendon release tenodesis and mini open rotator cuff  tear repair.  No symptomatic AC joint tenderness to palpation or arthritis last clinic visit.  Risk benefits are discussed including not limited to infection nerve vessel damage shoulder stiffness incomplete pain relief as well as possibility for more surgery.  Patient understands risk benefits and wishes to proceed.  All questions answered.  Anderson Malta, MD 05/24/2020, 11:42 AM

## 2020-05-24 NOTE — Anesthesia Postprocedure Evaluation (Signed)
Anesthesia Post Note  Patient: Kelly Lang  Procedure(s) Performed: right shoulder arthroscopy, rotator cuff tear repair biceps tenodesis (Right Shoulder)     Patient location during evaluation: PACU Anesthesia Type: General Level of consciousness: awake and alert Pain management: pain level controlled Vital Signs Assessment: post-procedure vital signs reviewed and stable Respiratory status: spontaneous breathing, nonlabored ventilation and respiratory function stable Cardiovascular status: blood pressure returned to baseline and stable Postop Assessment: no apparent nausea or vomiting Anesthetic complications: no   No complications documented.  Last Vitals:  Vitals:   05/24/20 1515 05/24/20 1519  BP: 103/63   Pulse: 82   Resp: 16   Temp:    SpO2: 91% 95%    Last Pain:  Vitals:   05/24/20 1500  TempSrc:   PainSc: Asleep                 Lucretia Kern

## 2020-05-24 NOTE — Anesthesia Preprocedure Evaluation (Signed)
Anesthesia Evaluation  Patient identified by MRN, date of birth, ID band Patient awake    Reviewed: Allergy & Precautions, NPO status , Patient's Chart, lab work & pertinent test results  Airway Mallampati: I  TM Distance: >3 FB Neck ROM: Full    Dental   Pulmonary former smoker,    Pulmonary exam normal        Cardiovascular hypertension, Pt. on medications Normal cardiovascular exam+ dysrhythmias Atrial Fibrillation      Neuro/Psych CVA    GI/Hepatic GERD  Medicated and Controlled,  Endo/Other  diabetes, Type 2, Insulin Dependent  Renal/GU      Musculoskeletal   Abdominal   Peds  Hematology   Anesthesia Other Findings   Reproductive/Obstetrics                             Anesthesia Physical Anesthesia Plan  ASA: III  Anesthesia Plan: General   Post-op Pain Management:  Regional for Post-op pain   Induction:   PONV Risk Score and Plan: 3 and Ondansetron and Midazolam  Airway Management Planned: Oral ETT  Additional Equipment:   Intra-op Plan:   Post-operative Plan: Extubation in OR  Informed Consent: I have reviewed the patients History and Physical, chart, labs and discussed the procedure including the risks, benefits and alternatives for the proposed anesthesia with the patient or authorized representative who has indicated his/her understanding and acceptance.       Plan Discussed with: CRNA and Surgeon  Anesthesia Plan Comments:         Anesthesia Quick Evaluation

## 2020-05-24 NOTE — Anesthesia Procedure Notes (Signed)
Procedure Name: Intubation Performed by: Verita Lamb, CRNA Pre-anesthesia Checklist: Patient identified, Emergency Drugs available, Suction available and Patient being monitored Patient Re-evaluated:Patient Re-evaluated prior to induction Oxygen Delivery Method: Circle system utilized Preoxygenation: Pre-oxygenation with 100% oxygen Induction Type: IV induction Ventilation: Mask ventilation without difficulty Laryngoscope Size: Mac and 3 Grade View: Grade II Tube type: Oral Tube size: 7.5 mm Number of attempts: 1 Airway Equipment and Method: Stylet and Oral airway Placement Confirmation: ETT inserted through vocal cords under direct vision,  positive ETCO2,  breath sounds checked- equal and bilateral and CO2 detector Secured at: 23 cm Tube secured with: Tape Dental Injury: Teeth and Oropharynx as per pre-operative assessment

## 2020-05-24 NOTE — Progress Notes (Signed)
Assisted Dr. Ossey with right, ultrasound guided, interscalene  block. Side rails up, monitors on throughout procedure. See vital signs in flow sheet. Tolerated Procedure well. 

## 2020-05-24 NOTE — Anesthesia Procedure Notes (Signed)
Anesthesia Regional Block: Interscalene brachial plexus block   Pre-Anesthetic Checklist: ,, timeout performed, Correct Patient, Correct Site, Correct Laterality, Correct Procedure, Correct Position, site marked, Risks and benefits discussed,  Surgical consent,  Pre-op evaluation,  At surgeon's request and post-op pain management  Laterality: Right  Prep: chloraprep       Needles:  Injection technique: Single-shot  Needle Type: Other     Needle Length: 9cm  Needle Gauge: 21   Needle insertion depth: 5 cm   Additional Needles:   Procedures:, nerve stimulator,,,,,,,   Nerve Stimulator or Paresthesia:  Response: 0.4 mA,   Additional Responses:   Narrative:  Start time: 05/24/2020 12:32 PM End time: 05/24/2020 12:42 PM Injection made incrementally with aspirations every 5 mL.  Performed by: Personally  Anesthesiologist: Arta Bruce, MD  Additional Notes: Monitors applied. Patient sedated. Sterile prep and drape,hand hygiene and sterile gloves were used. Needle position confirmed with evoked response at 0.4 mV.Local anesthetic injected incrementally after negative aspiration.Vascular puncture avoided. No complications. The patient tolerated the procedure well.

## 2020-05-24 NOTE — Discharge Instructions (Signed)
°Post Anesthesia Home Care Instructions ° °Activity: °Get plenty of rest for the remainder of the day. A responsible individual must stay with you for 24 hours following the procedure.  °For the next 24 hours, DO NOT: °-Drive a car °-Operate machinery °-Drink alcoholic beverages °-Take any medication unless instructed by your physician °-Make any legal decisions or sign important papers. ° °Meals: °Start with liquid foods such as gelatin or soup. Progress to regular foods as tolerated. Avoid greasy, spicy, heavy foods. If nausea and/or vomiting occur, drink only clear liquids until the nausea and/or vomiting subsides. Call your physician if vomiting continues. ° °Special Instructions/Symptoms: °Your throat may feel dry or sore from the anesthesia or the breathing tube placed in your throat during surgery. If this causes discomfort, gargle with warm salt water. The discomfort should disappear within 24 hours. ° °If you had a scopolamine patch placed behind your ear for the management of post- operative nausea and/or vomiting: ° °1. The medication in the patch is effective for 72 hours, after which it should be removed.  Wrap patch in a tissue and discard in the trash. Wash hands thoroughly with soap and water. °2. You may remove the patch earlier than 72 hours if you experience unpleasant side effects which may include dry mouth, dizziness or visual disturbances. °3. Avoid touching the patch. Wash your hands with soap and water after contact with the patch. °   ° ° °Information for Discharge Teaching: °EXPAREL (bupivacaine liposome injectable suspension)  ° °Your surgeon or anesthesiologist gave you EXPAREL(bupivacaine) to help control your pain after surgery.  °· EXPAREL is a local anesthetic that provides pain relief by numbing the tissue around the surgical site. °· EXPAREL is designed to release pain medication over time and can control pain for up to 72 hours. °· Depending on how you respond to EXPAREL, you may  require less pain medication during your recovery. ° °Possible side effects: °· Temporary loss of sensation or ability to move in the area where bupivacaine was injected. °· Nausea, vomiting, constipation °· Rarely, numbness and tingling in your mouth or lips, lightheadedness, or anxiety may occur. °· Call your doctor right away if you think you may be experiencing any of these sensations, or if you have other questions regarding possible side effects. ° °Follow all other discharge instructions given to you by your surgeon or nurse. Eat a healthy diet and drink plenty of water or other fluids. ° °If you return to the hospital for any reason within 96 hours following the administration of EXPAREL, it is important for health care providers to know that you have received this anesthetic. A teal colored band has been placed on your arm with the date, time and amount of EXPAREL you have received in order to alert and inform your health care providers. Please leave this armband in place for the full 96 hours following administration, and then you may remove the band. ° ° °Regional Anesthesia Blocks ° °1. Numbness or the inability to move the "blocked" extremity may last from 3-48 hours after placement. The length of time depends on the medication injected and your individual response to the medication. If the numbness is not going away after 48 hours, call your surgeon. ° °2. The extremity that is blocked will need to be protected until the numbness is gone and the  Strength has returned. Because you cannot feel it, you will need to take extra care to avoid injury. Because it may be weak, you   may have difficulty moving it or using it. You may not know what position it is in without looking at it while the block is in effect. ° °3. For blocks in the legs and feet, returning to weight bearing and walking needs to be done carefully. You will need to wait until the numbness is entirely gone and the strength has returned. You  should be able to move your leg and foot normally before you try and bear weight or walk. You will need someone to be with you when you first try to ensure you do not fall and possibly risk injury. ° °4. Bruising and tenderness at the needle site are common side effects and will resolve in a few days. ° °5. Persistent numbness or new problems with movement should be communicated to the surgeon or the [facility] ([phone])/ [facility] ([phone]). °

## 2020-05-24 NOTE — Transfer of Care (Signed)
Immediate Anesthesia Transfer of Care Note  Patient: Adelma Bowdoin  Procedure(s) Performed: right shoulder arthroscopy, rotator cuff tear repair biceps tenodesis (Right Shoulder)  Patient Location: PACU  Anesthesia Type:General  Level of Consciousness: awake  Airway & Oxygen Therapy: Patient Spontanous Breathing and Patient connected to nasal cannula oxygen  Post-op Assessment: Report given to RN and Post -op Vital signs reviewed and stable  Post vital signs: Reviewed and stable  Last Vitals:  Vitals Value Taken Time  BP    Temp 36.6 C 05/24/20 1440  Pulse 77 05/24/20 1440  Resp 16 05/24/20 1440  SpO2 95 % 05/24/20 1440    Last Pain:  Vitals:   05/24/20 1046  TempSrc: Oral  PainSc: 6          Complications: No complications documented.

## 2020-05-24 NOTE — Brief Op Note (Signed)
   05/24/2020  2:30 PM  PATIENT:  Kelly Lang  59 y.o. female  PRE-OPERATIVE DIAGNOSIS:  righ shoulder rotator cuff tear  POST-OPERATIVE DIAGNOSIS:  righ shoulder rotator cuff tear  PROCEDURE:  Procedure(s): right shoulder arthroscopy, rotator cuff tear repair biceps tenodesis  SURGEON:  Surgeon(s): August Saucer, Corrie Mckusick, MD  ASSISTANT: Karenann Cai, PA  ANESTHESIA:   general  EBL: 25 ml    Total I/O In: 1300 [I.V.:1300] Out: 5 [Blood:5]  BLOOD ADMINISTERED: none  DRAINS: none   LOCAL MEDICATIONS USED:  none  SPECIMEN:  No Specimen  COUNTS:  YES  TOURNIQUET:  * No tourniquets in log *  DICTATION: .Other Dictation: Dictation Number [id number]  PLAN OF CARE: Discharge to home after PACU  PATIENT DISPOSITION:  PACU - hemodynamically stable

## 2020-05-25 ENCOUNTER — Encounter (HOSPITAL_BASED_OUTPATIENT_CLINIC_OR_DEPARTMENT_OTHER): Payer: Self-pay | Admitting: Orthopedic Surgery

## 2020-05-25 NOTE — Op Note (Signed)
NAME: Kelly Lang, Kelly Lang MEDICAL RECORD [id number] ACCOUNT [id number] DATE OF BIRTH:[birth year] FACILITY: [facility] LOCATION: [facility] PHYSICIAN:Brenin Heidelberger Diamantina [place], MD  OPERATIVE REPORT  DATE OF PROCEDURE:  05/24/2020  PREOPERATIVE DIAGNOSIS:  Right shoulder rotator cuff tear.  POSTOPERATIVE DIAGNOSIS:  Right shoulder rotator cuff tear and degenerative type 2 superior labrum anterior posterior tear.  PROCEDURE:  Right shoulder arthroscopy with superior labral debridement, biceps tenotomy with subsequent mini open rotator cuff tear repair and biceps tenodesis.  SURGEON:  Kelly Copa, MD  ASSISTANT:  Kelly Cai, PA  INDICATIONS:  Kelly Lang is a 59 year old patient with significant right shoulder pain.  MRI scan shows 90% thickness tear of the supraspinatus.  She presents now for operative management after failure of conservative measures and explanation of risks and  benefits.  OPERATIVE FINDINGS: 1.  Examination under anesthesia.  The patient had external rotation at 15 degrees of abduction to about 75 degrees.  Full forward flexion and passive abduction was present.  The patient had 1+ anterior and posterior instability with less than a  centimeter sulcus sign on the right hand side. 2.  Diagnostic and operative arthroscopy: a.  Type 2 SLAP tear, degenerative. b.  No loose bodies within the axillary recess. c.  Intact glenohumeral articular surfaces. d.  Intact anterior, inferior and posterior inferior glenohumeral ligaments, but with some fraying and attenuation of the capsular inferior recess tissue.   e.  90% tear of the supraspinatus on the articular side.   f.  Intact intra-articular subscap tendon.  PROCEDURE IN DETAIL:  The patient was brought to the operating room where general anesthetic was induced.  Preoperative antibiotics administered.  Timeout was called.  Right shoulder prescrubbed with alcohol and Betadine, allowed to air dry.  Prepped  with DuraPrep solution and  draped in a sterile manner.  Ioban used to seal the operative field as well as cover the axilla.  A solution of saline injected into the shoulder joint.  Posterior portal created 2 cm medial and inferior to the posterolateral  margin of acromion.  Diagnostic arthroscopy was performed.  Anterior portal created under direct visualization.  The patient did have a degenerative type 2 SLAP tear with instability of the biceps anchor particularly anteriorly.  Biceps tendon was  released and the superior labrum was debrided from the 10 o'clock to 2 o'clock position.  Glenohumeral articular surfaces were intact.  Anterior, inferior, posterior inferior glenohumeral ligaments were intact.  There was attenuation of the inferior  capsular tissue consistent with MRI findings.  The patient did have 90% thickness articular-sided tear of the supraspinatus over about 1.5 cm distance.  Infraspinatus was intact.  The intra-articular subscapularis was intact.  Following biceps tendon  release and superior labral debridement, thorough irrigation was performed of the shoulder joint.  Instruments were removed and the portals were closed using 3-0 nylon.  Ioban then used to cover the entire operative field.  A small 4 cm incision was made  off the anterolateral margin of the acromion.  Skin and subcutaneous tissue were sharply divided.  Raphae between the anterior middle deltoid was split.  Stay suture placed 4 cm from that distance.  This was to prevent further deltoid splitting.   Bursectomy was performed.  Acromioplasty performed.  CA ligament was preserved.  The biceps tendon was then tenodesed using 2 Arthrex knotless SutureTaks with the tendon under appropriate tension.  This gave a good functional tenodesis within the  intertubercular groove.  Next, attention was directed towards the rotator cuff tear.  Rotator cuff tear was present primarily along the anterior leading edge of the supraspinatus.  That final 10% was completed  and then the footprint was prepared to a  bleeding bony surface.  Two Arthrex suture punches were placed with 4 SutureTapes in each punch.  Four 0 Vicryl sutures were then placed in grasping fashion at the leading edge of the tendon.  Using the Scorpion suture passer, the 8 limbs of those  SutureTapes were placed equidistant along the rotator cuff tear.  They were then tied from posterior to anterior and crossed.  Anterior, 2 Vicryl sutures placed with the anterior SwiveLock and the posterior 2 sutures placed with the posterior SwiveLock.   Arthrex SwiveLocks were utilized to obtain good tension with the arm in abduction on the rotator cuff repair.  Watertight repair was achieved.  The patient was taken through a range of motion and found to have good stability of the repair.  Thorough  irrigation was then performed at this time.  The deltoid incision was closed using 0 Vicryl suture followed by interrupted inverted 0 Vicryl suture, 2-0 Vicryl suture and 3-0 Monocryl.  Impervious dressings and shoulder immobilizer were placed.  The  patient tolerated the procedure well without immediate complications, transferred to the recovery room in stable condition.  Kelly Lang's assistance was required at all times during the case for retraction, opening and closing and tissue mobilization.  His  assistance was a medical necessity.  CN/NUANCE  D:05/24/2020 T:05/25/2020 JOB:[id number]

## 2020-05-29 ENCOUNTER — Other Ambulatory Visit: Payer: Self-pay | Admitting: Internal Medicine

## 2020-05-29 DIAGNOSIS — I10 Essential (primary) hypertension: Secondary | ICD-10-CM

## 2020-05-29 MED FILL — glipiZIDE 5 MG TABS: 5 | 30 days supply | Qty: 15 | Fill #1

## 2020-05-29 MED FILL — XARELTO 20 MG TABLET: 20 | 30 days supply | Qty: 30 | Fill #6

## 2020-05-29 MED FILL — DULoxetine HCL 20 MG CPEP: 20 | 30 days supply | Qty: 30 | Fill #1

## 2020-05-30 MED FILL — OZEMPIC (1 MG/DOSE) 4 MG/3M: 4 | 28 days supply | Qty: 3 | Fill #0

## 2020-05-31 ENCOUNTER — Ambulatory Visit (INDEPENDENT_AMBULATORY_CARE_PROVIDER_SITE_OTHER): Payer: 59 | Admitting: Orthopedic Surgery

## 2020-05-31 DIAGNOSIS — M7541 Impingement syndrome of right shoulder: Secondary | ICD-10-CM

## 2020-05-31 DIAGNOSIS — M75111 Incomplete rotator cuff tear or rupture of right shoulder, not specified as traumatic: Secondary | ICD-10-CM

## 2020-06-01 ENCOUNTER — Encounter: Payer: Self-pay | Admitting: Orthopedic Surgery

## 2020-06-04 ENCOUNTER — Encounter: Payer: Self-pay | Admitting: Orthopedic Surgery

## 2020-06-04 ENCOUNTER — Encounter: Payer: Self-pay | Admitting: Internal Medicine

## 2020-06-04 NOTE — Progress Notes (Signed)
Post-Op Visit Note   Patient: Kelly Lang           Date of Birth: 01-22-1961           MRN: [id number] Visit Date: 05/31/2020 PCP: Marcine Matar, MD   Assessment & Plan:  Chief Complaint:  Chief Complaint  Patient presents with   Right Shoulder - Routine Post Op   Visit Diagnoses:  1. Impingement syndrome of right shoulder     Plan: Patient is a 59 year old female presents s/p right shoulder arthroscopy with rotator cuff repair and biceps tenodesis.  She is doing well.  She only takes pain medication about 1 time a day.  She is compliant with using her sling.  She does not have a CPM machine as her insurance did not cover a machine.  Sutures are intact and the sutures were removed and replaced with Steri-Strips.  Incisions are healing well with no evidence of dehiscence or infection.  On exam she has 80 degrees of passive forward flexion, 70 degrees of passive abduction, 30 degrees of passive external rotation.  She is doing home exercise program consisting of pendulums as well as passive range of motion.  She understands she is not supposed to lift anything with the right arm or actively move the right arm.  Plan to continue using the sling and discontinue the sling on 7/12.  With her lack of CPM machine, plan to get started in physical therapy 2 times a week for 6 weeks, specifically to work on passive range of motion for now.  Prescription for PT given to patient.  Plan to start active assisted range of motion in 2 weeks.  Plan to start strengthening of the rotator cuff 6 weeks out from procedure.  This date was written on her PT prescription.  Plan for patient to follow-up in 3 weeks for clinical recheck.  She agrees with plan.  Follow-Up Instructions: No follow-ups on file.   Orders:  Orders Placed This Encounter  Procedures   Ambulatory referral to Physical Therapy   No orders of the defined types were placed in this encounter.   Imaging: No results found.  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)    Low back pain 04/03/2014   Unspecified vitamin D deficiency 01/20/2014   Essential hypertension, benign 01/20/2014   Chronic anticoagulation 10/31/2013   Tachycardia induced cardiomyopathy (HCC)    H/O: hypothyroidism    Obesity    Type 2 diabetes mellitus with diabetic polyneuropathy, with long-term current use of insulin (HCC)    Arthritis    Dyslipidemia    Atrial fibrillation  10/25/2013   Past Medical History:  Diagnosis Date   Arthritis    "all over" (06/30/2017)   Chicken pox    Dyslipidemia    Dysrhythmia ablation 2018   a-fib   GERD (gastroesophageal reflux disease)    H/O: hypothyroidism    a. thyroid biopsy 1996 with synthroid. Stopped taking rx and was loss to follow up b. normal thyroid function 10/20/13   High cholesterol    Hx of cardiovascular stress test    ETT/Lexiscan Myoview (  12/2013):  No ischemia; not gated; low risk.   Hypertension    Obesity    Persistent atrial fibrillation (HCC)    a diagnosed 10/25/2013   Psoriasis    on Skyrizi   Seasonal allergies    Stroke [facility]) 2016   "some speech problems since" (06/30/2017)   Tachycardia induced cardiomyopathy (HCC)    a. 2/2 Afib with RVR for unknown duration (EF: 40-45%)   Thyroid disease    Type II diabetes mellitus (HCC)    a. hg A1c 10, newly diagnosed (10/26/13)    Family History  Adopted: Yes  Problem Relation Age of Onset   Hypertension Mother    Hyperlipidemia Mother     Past Surgical History:  Procedure  Laterality Date   ATRIAL FIBRILLATION ABLATION  06/30/2017   ATRIAL FIBRILLATION ABLATION N/A 06/30/2017   Procedure: Atrial Fibrillation Ablation;  Surgeon: Hillis Range, MD;  Location: [facility] INVASIVE CV LAB;  Service: Cardiovascular;  Laterality: N/A;   BIOPSY THYROID  1996   CARDIOVERSION N/A 12/07/2013   Procedure: CARDIOVERSION;  Surgeon: Everette Rank, MD;  Location: [facility] ENDOSCOPY;  Service: Cardiovascular;  Laterality: N/A;   CARDIOVERSION N/A 03/10/2016   Procedure: CARDIOVERSION;  Surgeon: Vesta Mixer, MD;  Location: [facility] ENDOSCOPY;  Service: Cardiovascular;  Laterality: N/A;   ESOPHAGOGASTRODUODENOSCOPY N/A 12/07/2017   Procedure: ESOPHAGOGASTRODUODENOSCOPY (EGD);  Surgeon: Hilarie Fredrickson, MD;  Location: [facility] ENDOSCOPY;  Service: Endoscopy;  Laterality: N/A;   EYE MUSCLE SURGERY Right ~ 1966   SHOULDER ARTHROSCOPY WITH ROTATOR CUFF REPAIR Right 05/24/2020   Procedure: right shoulder arthroscopy, rotator cuff tear repair biceps tenodesis;  Surgeon: Cammy Copa, MD;  Location: [facility];  Service: Orthopedics;  Laterality: Right;   TEE WITHOUT CARDIOVERSION N/A 06/29/2017   Procedure: TRANSESOPHAGEAL ECHOCARDIOGRAM (TEE);  Surgeon: Pricilla Riffle, MD;  Location: [facility] ENDOSCOPY;  Service: Cardiovascular;  Laterality: N/A;   TONSILLECTOMY  1971   Social History   Occupational History   Occupation: Designer, television/film set  Tobacco Use   Smoking status: Former Smoker    Packs/day: 1.00    Years: 22.00    Pack years: 22.00    Types: Cigarettes    Quit date: 12/01/2010    Years since quitting: 9.5   Smokeless tobacco: Never Used  Vaping Use   Vaping Use: Never used  Substance and Sexual Activity   Alcohol use: Yes    Comment: social   Drug use: No   Sexual activity: Not Currently

## 2020-06-05 ENCOUNTER — Encounter (HOSPITAL_BASED_OUTPATIENT_CLINIC_OR_DEPARTMENT_OTHER): Payer: Self-pay | Admitting: Orthopedic Surgery

## 2020-06-06 MED FILL — LANTUS SOLOSTAR 100 UNITS/M: 100 | 22 days supply | Qty: 30 | Fill #2

## 2020-06-08 ENCOUNTER — Other Ambulatory Visit: Payer: Self-pay

## 2020-06-08 ENCOUNTER — Ambulatory Visit: Payer: 59 | Attending: Surgical | Admitting: Physical Therapy

## 2020-06-08 DIAGNOSIS — M25611 Stiffness of right shoulder, not elsewhere classified: Secondary | ICD-10-CM

## 2020-06-08 DIAGNOSIS — G8929 Other chronic pain: Secondary | ICD-10-CM

## 2020-06-08 DIAGNOSIS — Z9889 Other specified postprocedural states: Secondary | ICD-10-CM | POA: Diagnosis present

## 2020-06-08 DIAGNOSIS — M25511 Pain in right shoulder: Secondary | ICD-10-CM | POA: Insufficient documentation

## 2020-06-08 NOTE — Therapy (Signed)
[facility] Outpatient Rehabilitation [facility] [address] [place], [place], [address] Phone: [phone]   Fax:  [phone]  Physical Therapy Evaluation  Patient Details  Name: Kelly Lang MRN: [id number] Date of Birth: 01-18-1961 Referring Provider (PT): Julieanne Cotton, [place]   Encounter Date: 06/08/2020   PT End of Session - 06/08/20 0934    Visit Number 1    Number of Visits 20    PT Start Time 0920    PT Stop Time 1000    PT Time Calculation (min) 40 min    Activity Tolerance Patient tolerated treatment well    Behavior During Therapy [facility] for tasks assessed/performed           Past Medical History:  Diagnosis Date  . Arthritis    "all over" (06/30/2017)  . Chicken pox   . Dyslipidemia   . Dysrhythmia ablation 2018   a-fib  . GERD (gastroesophageal reflux disease)   . H/O: hypothyroidism    a. thyroid biopsy 1996 with synthroid. Stopped taking rx and was loss to follow up b. normal thyroid function 10/20/13  . High cholesterol   . Hx of cardiovascular stress test    ETT/Lexiscan Myoview (12/2013):  No ischemia; not gated; low risk.  Marland Kitchen Hypertension   . Obesity   . Persistent atrial fibrillation (HCC)    a diagnosed 10/25/2013  . Psoriasis    on Skyrizi  . Seasonal allergies   . Stroke [facility]) 2016   "some speech problems since" (06/30/2017)  . 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 2/2 Afib with RVR for unknown duration (EF: 40-45%)  . Thyroid disease   . Type II diabetes mellitus (HCC)    a. hg A1c 10, newly diagnosed (10/26/13)    Past Surgical History:  Procedure Laterality Date  . ATRIAL FIBRILLATION ABLATION  06/30/2017  . ATRIAL FIBRILLATION ABLATION N/A 06/30/2017   Procedure: Atrial Fibrillation Ablation;  Surgeon: Hillis Range, MD;  Location: [facility] INVASIVE CV LAB;  Service: Cardiovascular;  Laterality: N/A;  . BICEPT TENODESIS Right 05/24/2020   Procedure: BICEPS TENODESIS;  Surgeon: Cammy Copa, MD;  Location:  [facility];  Service: Orthopedics;  Laterality: Right;  . BIOPSY THYROID  1996  . CARDIOVERSION N/A 12/07/2013   Procedure: CARDIOVERSION;  Surgeon: Everette Rank, MD;  Location: [facility] ENDOSCOPY;  Service: Cardiovascular;  Laterality: N/A;  . CARDIOVERSION N/A 03/10/2016   Procedure: CARDIOVERSION;  Surgeon: Vesta Mixer, MD;  Location: [facility] ENDOSCOPY;  Service: Cardiovascular;  Laterality: N/A;  . ESOPHAGOGASTRODUODENOSCOPY N/A 12/07/2017   Procedure: ESOPHAGOGASTRODUODENOSCOPY (EGD);  Surgeon: Hilarie Fredrickson, MD;  Location: [facility] ENDOSCOPY;  Service: Endoscopy;  Laterality: N/A;  . EYE MUSCLE SURGERY Right ~ 1966  . SHOULDER ARTHROSCOPY WITH ROTATOR CUFF REPAIR Right 05/24/2020   Procedure: right shoulder arthroscopy, rotator cuff tear repair biceps tenodesis;  Surgeon: Cammy Copa, MD;  Location: [facility];  Service: Orthopedics;  Laterality: Right;  . TEE WITHOUT CARDIOVERSION N/A 06/29/2017   Procedure: TRANSESOPHAGEAL ECHOCARDIOGRAM (TEE);  Surgeon: Pricilla Riffle, MD;  Location: [facility] ENDOSCOPY;  Service: Cardiovascular;  Laterality: N/A;  . TONSILLECTOMY  1971    There were no vitals filed for this visit.    Subjective Assessment - 06/08/20 0922    Subjective Pt reports getting surgery on 6/24. Pt has been doing exercises at home. 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 Pt reports pain is well managed.    Currently in Pain? No/denies  [facility] PT Assessment - 06/08/20 0001      Assessment   Medical Diagnosis M75.41 (ICD-10-CM) - Impingement syndrome of right shoulder    Referring Provider (PT) Magnant, Joycie Peek, PA-C    Onset Date/Surgical Date 05/24/20    Hand Dominance Right    Next MD Visit 06/20/20    Prior Therapy none      Precautions   Required Braces or Orthoses Sling      Restrictions   Weight Bearing Restrictions Yes    RUE Weight Bearing Non weight bearing      Balance Screen   Has the patient fallen in the  past 6 months No      Home Environment   Living Environment Private residence    Available Help at Discharge Personal care attendant      Prior Function   Level of Independence Independent with basic ADLs      Observation/Other Assessments   Focus on Therapeutic Outcomes (FOTO)  53%      PROM   Right Shoulder Flexion 115 Degrees    Right Shoulder ABduction 95 Degrees    Right Shoulder Internal Rotation --   [facility]   Right Shoulder External Rotation 35 Degrees    Right Shoulder Horizontal ABduction --   WFL   Right Shoulder Horizontal  ADduction --   [facility]     Strength   Right Elbow Flexion 4/5    Right Elbow Extension 4-/5    Right Forearm Pronation 4+/5    Right Forearm Supination 4+/5    Right Wrist Flexion 5/5    Right Wrist Extension 5/5    Right Wrist Radial Deviation 5/5    Right Wrist Ulnar Deviation 5/5    Right Hand Grip (lbs) 45/40/30    Left Hand Grip (lbs) 35/40/38                      Objective measurements completed on examination: See above findings.       [facility] Adult PT Treatment/Exercise - 06/08/20 0001      Exercises   Exercises Shoulder;Elbow;Wrist;Hand      Elbow Exercises   Elbow Flexion AROM;Right;10 reps    Elbow Extension AROM;Right;10 reps    Forearm Supination AROM;Right;10 reps    Forearm Pronation AROM;Right;10 reps      Hand Exercises   Hand Gripper with Large Beads x10      Wrist Exercises   Wrist Flexion AROM;Right;10 reps    Wrist Extension AROM;Right;10 reps    Wrist Radial Deviation AROM;Right;10 reps    Wrist Ulnar Deviation AROM;Right;10 reps      Manual Therapy   Manual Therapy Passive ROM    Passive ROM Within pt's available range per protocol                    PT Short Term Goals - 06/08/20 1328      PT SHORT TERM GOAL #1   Title Pt will be independent with AROM for her elbow, wrist, and hand    Time 4    Period Weeks    Status New    Target Date 07/06/20             PT Long Term  Goals - 06/08/20 1329      PT LONG TERM GOAL #1   Title Pt will be independent with advanced HEP    Time 10    Period Weeks    Status New  Target Date 08/17/20      PT LONG TERM GOAL #2   Title Pt will improve FOTO score from 53% limitation to 36%    Baseline --    Time 10    Period Weeks    Status New    Target Date 08/17/20      PT LONG TERM GOAL #3   Title Pt will be able to lift at least 20#s for her work tasks as a Designer, television/film set    Baseline Unable    Time 10    Period Weeks    Status New    Target Date 08/17/20      PT LONG TERM GOAL #4   Title Pt will be able to lift at least 5#s overhead for home tasks    Baseline Unable    Time 10    Period Weeks    Status New    Target Date 08/17/20      PT LONG TERM GOAL #5   Title Pt will have full shoulder ROM for her ADLs    Time 10    Period Weeks    Status New    Target Date 08/17/20                  Plan - 06/08/20 1321    Clinical Impression Statement Kelly Lang is a 59 y/o F presenting to clinic s/p R RTC and biceps tenodesis on 6/24. Pt already doing pendulums and her caregiver has been assisting with providing PROM at home (pt's insurance did not cover CPM). Per ortho referral, PT to work on PROM and is to begin AAROM in 2 weeks, and strengthening to begin 07/12/20. Pt presents with overall no pain and good ROM s/p surgery. Educated pt on hand, wrist, and elbow AROM and strengthening in addition to her shoulder exercises/PROM. Pt would benefit from continued PT to assist with her post op rehab.    Personal Factors and Comorbidities Age;Comorbidity 1;Comorbidity 2    Comorbidities DM, OA    Examination-Activity Limitations Lift;Hygiene/Grooming;Reach Overhead;Caring for Others    Examination-Participation Restrictions Yard Work;Cleaning;Community Activity   Some work tasks   Clinical Decision Making Low    Rehab Potential Good    PT Frequency 2x / week    PT Duration 8 weeks    PT  Treatment/Interventions ADLs/Self Care Home Management;Cryotherapy;Electrical Stimulation;Iontophoresis 4mg /ml Dexamethasone;Moist Heat;Ultrasound;Functional mobility training;Therapeutic activities;Therapeutic exercise;Neuromuscular re-education;Patient/family education;Manual techniques;Scar mobilization;Passive range of motion;Dry needling;Taping;Vasopneumatic Device    PT Next Visit Plan Continue PROM of shoulder. AROM for elbow, wrist, and hand.    PT Home Exercise Plan Access Code [email or web address]    Consulted and Agree with Plan of Care Patient           Patient will benefit from skilled therapeutic intervention in order to improve the following deficits and impairments:  Decreased mobility, Hypomobility, Decreased scar mobility, Decreased coordination, Decreased strength, Increased fascial restricitons, Impaired flexibility, Impaired UE functional use, Postural dysfunction  Visit Diagnosis: Stiffness of right shoulder, not elsewhere classified  Chronic right shoulder pain  S/P right rotator cuff repair     Problem List Patient Active Problem List   Diagnosis Date Noted  . Multinodular goiter 09/16/2019  . Chronic pain disorder 08/09/2018  . Diabetic polyneuropathy associated with type 2 diabetes mellitus (HCC) 08/09/2018  . Gastroparesis 12/21/2017  . GI bleed 12/06/2017  . Hepatic steatosis 12/04/2017  . Chronic systolic CHF (congestive heart failure) (HCC) 09/16/2015  . Cerebrovascular accident (CVA) due to embolism of  left middle cerebral artery (HCC)   . Low back pain 04/03/2014  . Unspecified vitamin D deficiency 01/20/2014  . Essential hypertension, benign 01/20/2014  . Chronic anticoagulation 10/31/2013  . Tachycardia induced cardiomyopathy (HCC)   . H/O: hypothyroidism   . Obesity   . Type 2 diabetes mellitus with diabetic polyneuropathy, with long-term current use of insulin (HCC)   . Arthritis   . Dyslipidemia   . Atrial fibrillation  10/25/2013    Raja Liska April  Ma L Kemora Pinard PT, DPT 06/08/2020, 1:37 PM  [facility] [address] [place], [place], [address] Phone: [phone]   Fax:  [phone]  Name: Kelly Lang MRN: [id number] Date of Birth: 1961-06-08

## 2020-06-13 ENCOUNTER — Other Ambulatory Visit: Payer: Self-pay | Admitting: Pharmacist

## 2020-06-13 DIAGNOSIS — I10 Essential (primary) hypertension: Secondary | ICD-10-CM

## 2020-06-13 DIAGNOSIS — E785 Hyperlipidemia, unspecified: Secondary | ICD-10-CM

## 2020-06-13 MED ORDER — LISINOPRIL 10 MG PO TABS: 5.0000 mg | ORAL_TABLET | Freq: Every day | ORAL | 1 refills | Status: DC

## 2020-06-13 MED ORDER — ATORVASTATIN CALCIUM 40 MG PO TABS: 40.0000 mg | ORAL_TABLET | Freq: Every day | ORAL | 1 refills | Status: DC

## 2020-06-13 MED FILL — LISINOPRIL 10 MG TABS: 10 | 30 days supply | Qty: 15 | Fill #0

## 2020-06-13 MED FILL — ATORVASTATIN CALCIUM 40 MG: 40 | 30 days supply | Qty: 30 | Fill #0

## 2020-06-14 ENCOUNTER — Ambulatory Visit: Payer: 59 | Admitting: Physical Therapy

## 2020-06-20 ENCOUNTER — Encounter: Payer: Self-pay | Admitting: Endocrinology

## 2020-06-20 ENCOUNTER — Ambulatory Visit (INDEPENDENT_AMBULATORY_CARE_PROVIDER_SITE_OTHER): Payer: 59 | Admitting: Endocrinology

## 2020-06-20 ENCOUNTER — Other Ambulatory Visit: Payer: Self-pay

## 2020-06-20 VITALS — BP 122/70 | HR 80 | Ht 65.0 in | Wt 249.4 lb

## 2020-06-20 DIAGNOSIS — E1142 Type 2 diabetes mellitus with diabetic polyneuropathy: Secondary | ICD-10-CM | POA: Diagnosis not present

## 2020-06-20 DIAGNOSIS — Z794 Long term (current) use of insulin: Secondary | ICD-10-CM | POA: Diagnosis not present

## 2020-06-20 LAB — POCT GLYCOSYLATED HEMOGLOBIN (HGB A1C): Hemoglobin A1C: 8.5 % — AB (ref 4.0–5.6)

## 2020-06-20 MED ORDER — LANTUS SOLOSTAR 100 UNIT/ML ~~LOC~~ SOPN: 150.0000 [IU] | PEN_INJECTOR | SUBCUTANEOUS | 11 refills | Status: DC

## 2020-06-20 NOTE — Patient Instructions (Addendum)
check your blood sugar twice a day.  vary the time of day when you check, between before the 3 meals, and at bedtime.  also check if you have symptoms of your blood sugar being too high or too low.  please keep a record of the readings and bring it to your next appointment here (or you can bring the meter itself).  You can write it on any piece of paper.  please call us sooner if your blood sugar goes below 70, or if you have a lot of readings over 200. I have sent a prescription to your pharmacy, to increase the Lantus to 150 units each morning.   Please continue the same other diabetes medications.   We'll phase out the glipizide when we can, as it is redundant with the insulin.  Please come back for a follow-up appointment in 2 months.     Bariatric Surgery You have so much to gain by losing weight.  You may have already tried every diet and exercise plan imaginable.  And, you may have sought advice from your family physician, too.   Sometimes, in spite of such diligent efforts, you may not be able to achieve long-term results by yourself.  In cases of severe obesity, bariatric or weight loss surgery is a proven method of achieving long-term weight control.  Our Services Our bariatric surgery programs offer our patients new hope and long-term weight-loss solution.  Since introducing our services in 2003, we have conducted more than 2,400 successful procedures.  Our program is designated as a Investment banker, corporate by the Metabolic and Bariatric Surgery Accreditation and Quality Improvement Program (MBSAQIP), a Child psychotherapist that sets rigorous patient safety and outcome standards.  Our program is also designated as a Engineer, manufacturing systems by [employer].   Our exceptional weight-loss surgery team specializes in diagnosis, treatment, follow-up care, and ongoing support for our patients with severe weight loss challenges.  We currently offer laparoscopic sleeve gastrectomy,  gastric bypass, and adjustable gastric band (LAP-BAND).    Attend our Bariatrics Seminar Choosing to undergo a bariatric procedure is a big decision, and one that should not be taken lightly.  You now have two options in how you learn about weight-loss surgery - in person or online.  Our objective is to ensure you have all of the information that you need to evaluate the advantages and obligations of this life changing procedure.  Please note that you are not alone in this process, and our experienced team is ready to assist and answer all of your questions.  There are several ways to register for a seminar (either on-line or in person): 1)  Call [phone] 2) Go on-line to [facility] and register for either type of seminar.  [email or web address]

## 2020-06-20 NOTE — Progress Notes (Signed)
Subjective:    Patient ID: Kelly Lang, female    DOB: 1961/04/03, 59 y.o.   MRN: [id number]  HPI Pt returns for f/u of diabetes mellitus:  DM type: Insulin-requiring type 2.  Dx'ed: 2016.  Complications: GP, PN, and CVA.  Therapy: insulin since 2018, Ozempic, and 2 oral meds.  GDM: never DKA: never Severe hypoglycemia: never Pancreatitis: never Pancreatic imaging: CT (2019): fatty infiltration of the pancreas Other: she also has MNG (stable x many years, so malignancy risk is minimal; she had right thyroid bx in 1996.  She says it was benign; she takes qd insulin, at least for now.  She did not tolerate Jardiance (vaginitis).   Interval history: no cbg record, but states cbg's vary from 185-220.  There is no trend throughout the day.  pt states she feels well in general.   Past Medical History:  Diagnosis Date  . Arthritis    "all over" (06/30/2017)  . Chicken pox   . Dyslipidemia   . Dysrhythmia ablation 2018   a-fib  . GERD (gastroesophageal reflux disease)   . H/O: hypothyroidism    a. thyroid biopsy 1996 with synthroid. Stopped taking rx and was loss to follow up b. normal thyroid function 10/20/13  . High cholesterol   . Hx of cardiovascular stress test    ETT/Lexiscan Myoview (12/2013):  No ischemia; not gated; low risk.  Marland Kitchen Hypertension   . Obesity   . Persistent atrial fibrillation ([place])    a diagnosed 10/25/2013  . Psoriasis    on Skyrizi  . Seasonal allergies   . Stroke [facility]) 2016   "some speech problems since" (06/30/2017)  . 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 2/2 Afib with RVR for unknown duration (EF: 40-45%)  . Thyroid disease   . Type II diabetes mellitus ([place])    a. hg A1c 10, newly diagnosed (10/26/13)    Past Surgical History:  Procedure Laterality Date  . ATRIAL FIBRILLATION ABLATION  06/30/2017  . ATRIAL FIBRILLATION ABLATION N/A 06/30/2017   Procedure: Atrial Fibrillation Ablation;  Surgeon: Thompson Grayer, MD;  Location: [place]  CV LAB;  Service: Cardiovascular;  Laterality: N/A;  . BICEPT TENODESIS Right 05/24/2020   Procedure: BICEPS TENODESIS;  Surgeon: Meredith Pel, MD;  Location: [place];  Service: Orthopedics;  Laterality: Right;  . BIOPSY THYROID  1996  . CARDIOVERSION N/A 12/07/2013   Procedure: CARDIOVERSION;  Surgeon: Casandra Doffing, MD;  Location: [place];  Service: Cardiovascular;  Laterality: N/A;  . CARDIOVERSION N/A 03/10/2016   Procedure: CARDIOVERSION;  Surgeon: Thayer Headings, MD;  Location: [facility] ENDOSCOPY;  Service: Cardiovascular;  Laterality: N/A;  . ESOPHAGOGASTRODUODENOSCOPY N/A 12/07/2017   Procedure: ESOPHAGOGASTRODUODENOSCOPY (EGD);  Surgeon: Irene Shipper, MD;  Location: [facility] ENDOSCOPY;  Service: Endoscopy;  Laterality: N/A;  . EYE MUSCLE SURGERY Right ~ 1966  . SHOULDER ARTHROSCOPY WITH ROTATOR CUFF REPAIR Right 05/24/2020   Procedure: right shoulder arthroscopy, rotator cuff tear repair biceps tenodesis;  Surgeon: Meredith Pel, MD;  Location: [place];  Service: Orthopedics;  Laterality: Right;  . TEE WITHOUT CARDIOVERSION N/A 06/29/2017   Procedure: TRANSESOPHAGEAL ECHOCARDIOGRAM (TEE);  Surgeon: Fay Records, MD;  Location: [place];  Service: Cardiovascular;  Laterality: N/A;  . TONSILLECTOMY  1971    Social History   Socioeconomic History  . Marital status: Divorced    Spouse name: Not on file  . Number of children: 0  . Years of education: 36  .  Highest education level: Not on file  Occupational History  . Occupation: Chief Technology Officer  Tobacco Use  . Smoking status: Former Smoker    Packs/day: 1.00    Years: 22.00    Pack years: 22.00    Types: Cigarettes    Quit date: 12/01/2010    Years since quitting: 9.5  . Smokeless tobacco: Never Used  Vaping Use  . Vaping Use: Never used  Substance and Sexual Activity  . Alcohol use: Yes    Comment: social  . Drug use: No  . Sexual activity: Not Currently  Other Topics Concern  .  Not on file  Social History Narrative   Moved to [place] from [place] in 2002.     Fun: shop, sew, decorate   Denies any beliefs effecting healthcare   Social Determinants of Health   Financial Resource Strain:   . Difficulty of Paying Living Expenses:   Food Insecurity:   . Worried About Charity fundraiser in the Last Year:   . Arboriculturist in the Last Year:   Transportation Needs:   . Film/video editor (Medical):   Marland Kitchen Lack of Transportation (Non-Medical):   Physical Activity:   . Days of Exercise per Week:   . Minutes of Exercise per Session:   Stress:   . Feeling of Stress :   Social Connections:   . Frequency of Communication with Friends and Family:   . Frequency of Social Gatherings with Friends and Family:   . Attends Religious Services:   . Active Member of Clubs or Organizations:   . Attends Archivist Meetings:   Marland Kitchen Marital Status:   Intimate Partner Violence:   . Fear of Current or Ex-Partner:   . Emotionally Abused:   Marland Kitchen Physically Abused:   . Sexually Abused:     Current Outpatient Medications on File Prior to Visit  Medication Sig Dispense Refill  . atorvastatin (LIPITOR) 40 MG tablet Take 1 tablet (40 mg total) by mouth daily. 30 tablet 1  . betamethasone dipropionate 0.05 % cream APPLY 1 APPLICATION OF CREAM TWICE DAILY AS NEEDED FOR FLARES    . Blood Glucose Monitoring Suppl (Stevens) w/Device KIT Use as instructed to check blood sugar up to 3 times daily. 1 kit 0  . diclofenac sodium (VOLTAREN) 1 % GEL APPLY 2 GRAMS TOPICALLY 4 (FOUR) TIMES DAILY AS NEEDED (PAIN). 100 g 5  . DULoxetine (CYMBALTA) 20 MG capsule TAKE 1 CAPSULE (20 MG TOTAL) BY MOUTH DAILY. 30 capsule 2  . glipiZIDE (GLUCOTROL) 5 MG tablet Take 0.5 tablets (2.5 mg total) by mouth daily before breakfast. 60 tablet 3  . Insulin Pen Needle (ULTICARE MICRO PEN NEEDLES) 32G X 4 MM MISC 1 applicator by Does not apply route at bedtime. 100 each 2  .  lisinopril (ZESTRIL) 10 MG tablet Take 0.5 tablets (5 mg total) by mouth daily. 15 tablet 1  . metFORMIN (GLUCOPHAGE) 1000 MG tablet TAKE 1 TABLET (1,000 MG TOTAL) BY MOUTH 2 (TWO) TIMES DAILY WITH A MEAL. 60 tablet 3  . metoprolol tartrate (LOPRESSOR) 25 MG tablet TAKE 1 & 1/2 TABLET BY MOUTH 2 TIMES DAILY 270 tablet 2  . metroNIDAZOLE (METROCREAM) 0.75 % cream APPLY THIN LAYER TO FACE TWICE DAILY    . ONETOUCH DELICA LANCETS 42V MISC Use as instructed to check blood sugar up to 3 times daily. 100 each 11  . ONETOUCH VERIO test strip USE AS DIRECTED UP TO THREE  TIMES DAILY TO  TEST  BLOOD  SUGAR 100 each 11  . Semaglutide, 1 MG/DOSE, (OZEMPIC, 1 MG/DOSE,) 2 MG/1.5ML SOPN Inject 1 mg into the skin once a week. 2 pen 11  . SKYRIZI, 150 MG DOSE, 75 MG/0.83ML PSKT Inject 150 mg as directed See admin instructions. 4x's per year    . XARELTO 20 MG TABS tablet TAKE 1 TABLET BY MOUTH DAILY WITH SUPPER. 30 tablet 10   No current facility-administered medications on file prior to visit.    Allergies  Allergen Reactions  . Jardiance [Empagliflozin] Other (See Comments)    Yeast infections  . Codeine Itching and Rash    Family History  Adopted: Yes  Problem Relation Age of Onset  . Hypertension Mother   . Hyperlipidemia Mother     BP 122/70 (BP Location: Left Arm, Patient Position: Sitting, Cuff Size: Large)   Pulse 80   Ht _0  (1.651 m)   Wt 249 lb 6.4 oz (113.1 kg)   LMP 04/15/2015 (LMP Unknown) Comment: last in may  SpO2 97%   BMI 41.50 kg/m    Review of Systems She denies hypoglycemia.      Objective:   Physical Exam VITAL SIGNS:  See vs page GENERAL: no distress Pulses: dorsalis pedis intact bilat.   MSK: no deformity of the feet CV: trace bilat leg edema Skin:  no ulcer on the feet.  normal color and temp on the feet. Neuro: sensation is intact to touch on the feet  Lab Results  Component Value Date   HGBA1C 8.5 (A) 06/20/2020       Assessment & Plan:  Obesity,  persistent Insulin-requiring type 2 DM, with GP: she needs increased rx.    Patient Instructions  check your blood sugar twice a day.  vary the time of day when you check, between before the 3 meals, and at bedtime.  also check if you have symptoms of your blood sugar being too high or too low.  please keep a record of the readings and bring it to your next appointment here (or you can bring the meter itself).  You can write it on any piece of paper.  please call us sooner if your blood sugar goes below 70, or if you have a lot of readings over 200. I have sent a prescription to your pharmacy, to increase the Lantus to 150 units each morning.   Please continue the same other diabetes medications.   We'll phase out the glipizide when we can, as it is redundant with the insulin.  Please come back for a follow-up appointment in 2 months.     Bariatric Surgery You have so much to gain by losing weight.  You may have already tried every diet and exercise plan imaginable.  And, you may have sought advice from your family physician, too.   Sometimes, in spite of such diligent efforts, you may not be able to achieve long-term results by yourself.  In cases of severe obesity, bariatric or weight loss surgery is a proven method of achieving long-term weight control.  Our Services Our bariatric surgery programs offer our patients new hope and long-term weight-loss solution.  Since introducing our services in 2003, we have conducted more than 2,400 successful procedures.  Our program is designated as a Programmer, multimedia by the Metabolic and Bariatric Surgery Accreditation and Quality Improvement Program (MBSAQIP), a IT trainer that sets rigorous patient safety and outcome standards.  Our program is also designated as  a Center of Excellence by [employer].   Our exceptional weight-loss surgery team specializes in diagnosis, treatment, follow-up care, and ongoing support for our  patients with severe weight loss challenges.  We currently offer laparoscopic sleeve gastrectomy, gastric bypass, and adjustable gastric band (LAP-BAND).    Attend our [place] Choosing to undergo a bariatric procedure is a big decision, and one that should not be taken lightly.  You now have two options in how you learn about weight-loss surgery - in person or online.  Our objective is to ensure you have all of the information that you need to evaluate the advantages and obligations of this life changing procedure.  Please note that you are not alone in this process, and our experienced team is ready to assist and answer all of your questions.  There are several ways to register for a seminar (either on-line or in person): 1)  Call [phone] 2) Go on-line to [facility] and register for either type of seminar.  [email or web address]

## 2020-06-21 ENCOUNTER — Ambulatory Visit (INDEPENDENT_AMBULATORY_CARE_PROVIDER_SITE_OTHER): Payer: 59 | Admitting: Orthopedic Surgery

## 2020-06-21 ENCOUNTER — Encounter: Payer: Self-pay | Admitting: Orthopedic Surgery

## 2020-06-21 ENCOUNTER — Ambulatory Visit: Payer: 59 | Admitting: Physical Therapy

## 2020-06-21 ENCOUNTER — Encounter: Payer: Self-pay | Admitting: Physical Therapy

## 2020-06-21 DIAGNOSIS — M25611 Stiffness of right shoulder, not elsewhere classified: Secondary | ICD-10-CM | POA: Diagnosis not present

## 2020-06-21 DIAGNOSIS — G8929 Other chronic pain: Secondary | ICD-10-CM

## 2020-06-21 DIAGNOSIS — Z9889 Other specified postprocedural states: Secondary | ICD-10-CM

## 2020-06-21 DIAGNOSIS — M75111 Incomplete rotator cuff tear or rupture of right shoulder, not specified as traumatic: Secondary | ICD-10-CM

## 2020-06-21 NOTE — Progress Notes (Signed)
Post-Op Visit Note   Patient: Kelly Lang           Date of Birth: 15-Jun-1961           MRN: [id number] Visit Date: 06/21/2020 PCP: Marcine Matar, MD   Assessment & Plan:  Chief Complaint:  Chief Complaint  Patient presents with  . Right Shoulder - Routine Post Op   Visit Diagnoses:  1. Nontraumatic incomplete tear of right rotator cuff     Plan: Kelly Lang is a 59 year old patient is now 4 weeks out right shoulder arthroscopy rotator cuff repair biceps tenodesis.  Doing well.  In physical therapy doing home exercise program.  On exam she has excellent range of motion.  No Popeye deformity.  No grinding.  Plan is to start strengthening in 2 weeks 4-week return for clinical recheck and release.  Follow-Up Instructions: Return in about 4 weeks (around 07/19/2020).   Orders:  No orders of the defined types were placed in this encounter.  No orders of the defined types were placed in this encounter.   Imaging: No results found.  PMFS History: Patient Active Problem List   Diagnosis Date Noted  . Multinodular goiter 09/16/2019  . Chronic pain disorder 08/09/2018  . Diabetic polyneuropathy associated with type 2 diabetes mellitus (HCC) 08/09/2018  . Gastroparesis 12/21/2017  . GI bleed 12/06/2017  . Hepatic steatosis 12/04/2017  . Chronic systolic CHF (congestive heart failure) (HCC) 09/16/2015  . 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 Low back pain 04/03/2014  . Unspecified vitamin D deficiency 01/20/2014  . Essential hypertension, benign 01/20/2014  . Chronic anticoagulation 10/31/2013  . Tachycardia induced cardiomyopathy (HCC)   . H/O: hypothyroidism   . Obesity   . Type 2 diabetes mellitus with diabetic polyneuropathy, with long-term current use of insulin (HCC)   . Arthritis   . Dyslipidemia   . Atrial fibrillation  10/25/2013   Past Medical History:  Diagnosis Date  . Arthritis    "all over" (06/30/2017)    . Chicken pox   . Dyslipidemia   . Dysrhythmia ablation 2018   a-fib  . GERD (gastroesophageal reflux disease)   . H/O: hypothyroidism    a. thyroid biopsy 1996 with synthroid. Stopped taking rx and was loss to follow up b. normal thyroid function 10/20/13  . High cholesterol   . Hx of cardiovascular stress test    ETT/Lexiscan Myoview (12/2013):  No ischemia; not gated; low risk.  Marland Kitchen Hypertension   . Obesity   . Persistent atrial fibrillation (HCC)    a diagnosed 10/25/2013  . Psoriasis    on Skyrizi  . Seasonal allergies   . Stroke [facility]) 2016   "some speech problems since" (06/30/2017)  . 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 2/2 Afib with RVR for unknown duration (EF: 40-45%)  . Thyroid disease   . Type II diabetes mellitus (HCC)    a. hg A1c 10, newly diagnosed (10/26/13)    Family History  Adopted: Yes  Problem Relation Age of Onset  . Hypertension Mother   . Hyperlipidemia Mother     Past Surgical History:  Procedure Laterality Date  . ATRIAL FIBRILLATION ABLATION  06/30/2017  . ATRIAL FIBRILLATION ABLATION N/A 06/30/2017   Procedure: Atrial Fibrillation Ablation;  Surgeon: Hillis Range, MD;  Location: [facility] INVASIVE CV LAB;  Service: Cardiovascular;  Laterality: N/A;  . BICEPT TENODESIS Right 05/24/2020   Procedure: BICEPS TENODESIS;  Surgeon: Rise Paganini  Lorin Picket, MD;  Location: [facility];  Service: Orthopedics;  Laterality: Right;  . BIOPSY THYROID  1996  . CARDIOVERSION N/A 12/07/2013   Procedure: CARDIOVERSION;  Surgeon: Everette Rank, MD;  Location: [facility] ENDOSCOPY;  Service: Cardiovascular;  Laterality: N/A;  . CARDIOVERSION N/A 03/10/2016   Procedure: CARDIOVERSION;  Surgeon: Vesta Mixer, MD;  Location: [facility] ENDOSCOPY;  Service: Cardiovascular;  Laterality: N/A;  . ESOPHAGOGASTRODUODENOSCOPY N/A 12/07/2017   Procedure: ESOPHAGOGASTRODUODENOSCOPY (EGD);  Surgeon: Hilarie Fredrickson, MD;  Location: [facility] ENDOSCOPY;  Service: Endoscopy;  Laterality: N/A;  .  EYE MUSCLE SURGERY Right ~ 1966  . SHOULDER ARTHROSCOPY WITH ROTATOR CUFF REPAIR Right 05/24/2020   Procedure: right shoulder arthroscopy, rotator cuff tear repair biceps tenodesis;  Surgeon: Cammy Copa, MD;  Location: [facility];  Service: Orthopedics;  Laterality: Right;  . TEE WITHOUT CARDIOVERSION N/A 06/29/2017   Procedure: TRANSESOPHAGEAL ECHOCARDIOGRAM (TEE);  Surgeon: Pricilla Riffle, MD;  Location: [facility] ENDOSCOPY;  Service: Cardiovascular;  Laterality: N/A;  . TONSILLECTOMY  1971   Social History   Occupational History  . Occupation: Designer, television/film set  Tobacco Use  . Smoking status: Former Smoker    Packs/day: 1.00    Years: 22.00    Pack years: 22.00    Types: Cigarettes    Quit date: 12/01/2010    Years since quitting: 9.5  . Smokeless tobacco: Never Used  Vaping Use  . Vaping Use: Never used  Substance and Sexual Activity  . Alcohol use: Yes    Comment: social  . Drug use: No  . Sexual activity: Not Currently

## 2020-06-21 NOTE — Therapy (Signed)
[facility] Outpatient Rehabilitation [facility] [address] [place], [place], [address] Phone: [phone]   Fax:  [phone]  Physical Therapy Treatment  Patient Details  Name: Kelly Lang MRN: [id number] Date of Birth: 1961-06-04 Referring Provider (PT): Julieanne Cotton, [place]   Encounter Date: 06/21/2020   PT End of Session - 06/21/20 1619    Visit Number 2    Number of Visits 20    PT Start Time 1540    PT Stop Time 1619    PT Time Calculation (min) 39 min    Activity Tolerance Patient tolerated treatment well    Behavior During Therapy [facility] for tasks assessed/performed           Past Medical History:  Diagnosis Date  . Arthritis    "all over" (06/30/2017)  . Chicken pox   . Dyslipidemia   . Dysrhythmia ablation 2018   a-fib  . GERD (gastroesophageal reflux disease)   . H/O: hypothyroidism    a. thyroid biopsy 1996 with synthroid. Stopped taking rx and was loss to follow up b. normal thyroid function 10/20/13  . High cholesterol   . Hx of cardiovascular stress test    ETT/Lexiscan Myoview (12/2013):  No ischemia; not gated; low risk.  Marland Kitchen Hypertension   . Obesity   . Persistent atrial fibrillation (HCC)    a diagnosed 10/25/2013  . Psoriasis    on Skyrizi  . Seasonal allergies   . Stroke [facility]) 2016   "some speech problems since" (06/30/2017)  . 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 2/2 Afib with RVR for unknown duration (EF: 40-45%)  . Thyroid disease   . Type II diabetes mellitus (HCC)    a. hg A1c 10, newly diagnosed (10/26/13)    Past Surgical History:  Procedure Laterality Date  . ATRIAL FIBRILLATION ABLATION  06/30/2017  . ATRIAL FIBRILLATION ABLATION N/A 06/30/2017   Procedure: Atrial Fibrillation Ablation;  Surgeon: Hillis Range, MD;  Location: [facility] INVASIVE CV LAB;  Service: Cardiovascular;  Laterality: N/A;  . BICEPT TENODESIS Right 05/24/2020   Procedure: BICEPS TENODESIS;  Surgeon: Cammy Copa, MD;  Location:  [facility];  Service: Orthopedics;  Laterality: Right;  . BIOPSY THYROID  1996  . CARDIOVERSION N/A 12/07/2013   Procedure: CARDIOVERSION;  Surgeon: Everette Rank, MD;  Location: [facility] ENDOSCOPY;  Service: Cardiovascular;  Laterality: N/A;  . CARDIOVERSION N/A 03/10/2016   Procedure: CARDIOVERSION;  Surgeon: Vesta Mixer, MD;  Location: [facility] ENDOSCOPY;  Service: Cardiovascular;  Laterality: N/A;  . ESOPHAGOGASTRODUODENOSCOPY N/A 12/07/2017   Procedure: ESOPHAGOGASTRODUODENOSCOPY (EGD);  Surgeon: Hilarie Fredrickson, MD;  Location: [facility] ENDOSCOPY;  Service: Endoscopy;  Laterality: N/A;  . EYE MUSCLE SURGERY Right ~ 1966  . SHOULDER ARTHROSCOPY WITH ROTATOR CUFF REPAIR Right 05/24/2020   Procedure: right shoulder arthroscopy, rotator cuff tear repair biceps tenodesis;  Surgeon: Cammy Copa, MD;  Location: [facility];  Service: Orthopedics;  Laterality: Right;  . TEE WITHOUT CARDIOVERSION N/A 06/29/2017   Procedure: TRANSESOPHAGEAL ECHOCARDIOGRAM (TEE);  Surgeon: Pricilla Riffle, MD;  Location: [facility] ENDOSCOPY;  Service: Cardiovascular;  Laterality: N/A;  . TONSILLECTOMY  1971    There were no vitals filed for this visit.   Subjective Assessment - 06/21/20 1541    Subjective Pt. had MD follow up earlier today. Plan is to start adding strengthening for shoulder in 2 weeks (OK now per order to add AAROM). No pain pre-tx/    Currently in Pain? No/denies  [facility] Adult PT Treatment/Exercise - 06/21/20 0001      Elbow Exercises   Other elbow exercises elbow flexion with bilat. UE holding wand 2x10      Shoulder Exercises: Supine   Other Supine Exercises supine wand ER AAROM x 20 reps    Other Supine Exercises supine flexion AAROM with left hand holding right arm, supine wand chest press AAROM x 15 reps      Shoulder Exercises: Seated   Other Seated Exercises table slide shoulder flexion AAROM x 15 reps    Other Seated Exercises  seated scapular retraction x 15 reps      Shoulder Exercises: Standing   Other Standing Exercises "saw" protraction/retraction 2x10      Shoulder Exercises: Pulleys   Flexion 2 minutes      Manual Therapy   Passive ROM right shoulder 4-way PROM                  PT Education - 06/21/20 1618    Education Details HEP updates-AAROM, POC, progressing well but still urge caution against any lifting or excessive shoulder use given timeframe post-op    Person(s) Educated Patient    Methods Explanation    Comprehension Verbalized understanding            PT Short Term Goals - 06/08/20 1328      PT SHORT TERM GOAL #1   Title Pt will be independent with AROM for her elbow, wrist, and hand    Time 4    Period Weeks    Status New    Target Date 07/06/20             PT Long Term Goals - 06/08/20 1329      PT LONG TERM GOAL #1   Title Pt will be independent with advanced HEP    Time 10    Period Weeks    Status New    Target Date 08/17/20      PT LONG TERM GOAL #2   Title Pt will improve FOTO score from 53% limitation to 36%    Baseline --    Time 10    Period Weeks    Status New    Target Date 08/17/20      PT LONG TERM GOAL #3   Title Pt will be able to lift at least 20#s for her work tasks as a Designer, television/film set    Baseline Unable    Time 10    Period Weeks    Status New    Target Date 08/17/20      PT LONG TERM GOAL #4   Title Pt will be able to lift at least 5#s overhead for home tasks    Baseline Unable    Time 10    Period Weeks    Status New    Target Date 08/17/20      PT LONG TERM GOAL #5   Title Pt will have full shoulder ROM for her ADLs    Time 10    Period Weeks    Status New    Target Date 08/17/20                 Plan - 06/21/20 1620    Clinical Impression Statement Pt. doing well 4 weeks post-op with minimal stiffness. Progressed today with AAROM and updated HEP reflective of additions. Progress re: therapy goals will be  ongoing given etiology symptoms but so far progressing well within restrictions.  Personal Factors and Comorbidities Age;Comorbidity 1;Comorbidity 2    Comorbidities DM, OA    Examination-Activity Limitations Lift;Hygiene/Grooming;Reach Overhead;Caring for Others    Examination-Participation Restrictions Yard Work;Cleaning;Community Activity    Stability/Clinical Decision Making Stable/Uncomplicated    Clinical Decision Making Low    Rehab Potential Good    PT Frequency 2x / week    PT Duration 8 weeks    PT Treatment/Interventions ADLs/Self Care Home Management;Cryotherapy;Electrical Stimulation;Iontophoresis 4mg /ml Dexamethasone;Moist Heat;Ultrasound;Functional mobility training;Therapeutic activities;Therapeutic exercise;Neuromuscular re-education;Patient/family education;Manual techniques;Scar mobilization;Passive range of motion;Dry needling;Taping;Vasopneumatic Device    PT Next Visit Plan Continue PROM, AAROM for shoulder, AROM for elbow/wrist/hand prn, OK to add strengthening in 2 weeks per MD    PT Home Exercise Plan Access Code [email or web address]-added supine wand ER, supine flexion AAROM with left hand assist, supine wand chest press AAROM, table slide    Consulted and Agree with Plan of Care Patient           Patient will benefit from skilled therapeutic intervention in order to improve the following deficits and impairments:  Decreased mobility, Hypomobility, Decreased scar mobility, Decreased coordination, Decreased strength, Increased fascial restricitons, Impaired flexibility, Impaired UE functional use, Postural dysfunction  Visit Diagnosis: Stiffness of right shoulder, not elsewhere classified  Chronic right shoulder pain  S/P right rotator cuff repair     Problem List Patient Active Problem List   Diagnosis Date Noted  . Multinodular goiter 09/16/2019  . Chronic pain disorder 08/09/2018  . Diabetic polyneuropathy associated with type 2 diabetes mellitus (HCC) 08/09/2018   . Gastroparesis 12/21/2017  . GI bleed 12/06/2017  . Hepatic steatosis 12/04/2017  . Chronic systolic CHF (congestive heart failure) (HCC) 09/16/2015  . 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 Low back pain 04/03/2014  . Unspecified vitamin D deficiency 01/20/2014  . Essential hypertension, benign 01/20/2014  . Chronic anticoagulation 10/31/2013  . Tachycardia induced cardiomyopathy (HCC)   . H/O: hypothyroidism   . Obesity   . Type 2 diabetes mellitus with diabetic polyneuropathy, with long-term current use of insulin (HCC)   . Arthritis   . Dyslipidemia   . Atrial fibrillation  10/25/2013    10/27/2013, PT, DPT 06/21/20 4:25 PM  [facility] [address] [place], [place], [place] Phone: [phone]   Fax:  [phone]  Name: Kelly Lang MRN: [name] Date of Birth: 09-Sep-1961

## 2020-06-26 ENCOUNTER — Other Ambulatory Visit: Payer: Self-pay

## 2020-06-26 ENCOUNTER — Ambulatory Visit: Payer: 59 | Admitting: Physical Therapy

## 2020-06-26 DIAGNOSIS — M25511 Pain in right shoulder: Secondary | ICD-10-CM

## 2020-06-26 DIAGNOSIS — M25611 Stiffness of right shoulder, not elsewhere classified: Secondary | ICD-10-CM

## 2020-06-26 DIAGNOSIS — Z9889 Other specified postprocedural states: Secondary | ICD-10-CM

## 2020-06-26 DIAGNOSIS — G8929 Other chronic pain: Secondary | ICD-10-CM

## 2020-06-26 MED FILL — OZEMPIC (1 MG/DOSE) 4 MG/3M: 4 | 28 days supply | Qty: 3 | Fill #1

## 2020-06-26 NOTE — Therapy (Signed)
[facility] Outpatient Rehabilitation [facility] [address] [place], [place], [address] Phone: [phone]   Fax:  [phone]  Physical Therapy Treatment  Patient Details  Name: Kelly Lang MRN: [id number] Date of Birth: [birth year] Referring Provider (PT): Julieanne Cotton, [place]   Encounter Date: 06/26/2020   PT End of Session - 06/26/20 1641    Visit Number 3    Number of Visits 20    PT Start Time 1615    PT Stop Time 1655    PT Time Calculation (min) 40 min    Activity Tolerance Patient tolerated treatment well    Behavior During Therapy [facility] for tasks assessed/performed           Past Medical History:  Diagnosis Date  . Arthritis    "all over" (06/30/2017)  . Chicken pox   . Dyslipidemia   . Dysrhythmia ablation 2018   a-fib  . GERD (gastroesophageal reflux disease)   . H/O: hypothyroidism    a. thyroid biopsy 1996 with synthroid. Stopped taking rx and was loss to follow up b. normal thyroid function 10/20/13  . High cholesterol   . Hx of cardiovascular stress test    ETT/Lexiscan Myoview (12/2013):  No ischemia; not gated; low risk.  Marland Kitchen Hypertension   . Obesity   . Persistent atrial fibrillation (HCC)    a diagnosed 10/25/2013  . Psoriasis    on Skyrizi  . Seasonal allergies   . Stroke [facility]) 2016   "some speech problems since" (06/30/2017)  . 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 2/2 Afib with RVR for unknown duration (EF: 40-45%)  . Thyroid disease   . Type II diabetes mellitus (HCC)    a. hg A1c 10, newly diagnosed (10/26/13)    Past Surgical History:  Procedure Laterality Date  . ATRIAL FIBRILLATION ABLATION  06/30/2017  . ATRIAL FIBRILLATION ABLATION N/A 06/30/2017   Procedure: Atrial Fibrillation Ablation;  Surgeon: Hillis Range, MD;  Location: [facility] INVASIVE CV LAB;  Service: Cardiovascular;  Laterality: N/A;  . BICEPT TENODESIS Right 05/24/2020   Procedure: BICEPS TENODESIS;  Surgeon: Cammy Copa, MD;  Location:  [facility];  Service: Orthopedics;  Laterality: Right;  . BIOPSY THYROID  1996  . CARDIOVERSION N/A 12/07/2013   Procedure: CARDIOVERSION;  Surgeon: Everette Rank, MD;  Location: [facility] ENDOSCOPY;  Service: Cardiovascular;  Laterality: N/A;  . CARDIOVERSION N/A 03/10/2016   Procedure: CARDIOVERSION;  Surgeon: Vesta Mixer, MD;  Location: [facility] ENDOSCOPY;  Service: Cardiovascular;  Laterality: N/A;  . ESOPHAGOGASTRODUODENOSCOPY N/A 12/07/2017   Procedure: ESOPHAGOGASTRODUODENOSCOPY (EGD);  Surgeon: Hilarie Fredrickson, MD;  Location: [facility] ENDOSCOPY;  Service: Endoscopy;  Laterality: N/A;  . EYE MUSCLE SURGERY Right ~ 1966  . SHOULDER ARTHROSCOPY WITH ROTATOR CUFF REPAIR Right 05/24/2020   Procedure: right shoulder arthroscopy, rotator cuff tear repair biceps tenodesis;  Surgeon: Cammy Copa, MD;  Location: [facility];  Service: Orthopedics;  Laterality: Right;  . TEE WITHOUT CARDIOVERSION N/A 06/29/2017   Procedure: TRANSESOPHAGEAL ECHOCARDIOGRAM (TEE);  Surgeon: Pricilla Riffle, MD;  Location: [facility] ENDOSCOPY;  Service: Cardiovascular;  Laterality: N/A;  . TONSILLECTOMY  1971    There were no vitals filed for this visit.   Subjective Assessment - 06/26/20 1647    Subjective Pt tolerating AAROM exercises well. Pt reports some biceps tightness.    Limitations Lifting    Currently in Pain? Yes    Pain Score 3     Pain Location Shoulder  Pain Orientation Right    Pain Type Surgical pain;Acute pain    Pain Radiating Towards bicep                             [facility] Adult PT Treatment/Exercise - 06/26/20 0001      Shoulder Exercises: Seated   External Rotation AAROM;Right;10 reps   wand   Flexion AAROM;10 reps;Right   wand   Abduction AAROM;10 reps;Right   with wand   Other Seated Exercises table slide shoulder flexion & abduction AAROM x 15 reps    Other Seated Exercises scapular clock x 10, retraction x 10      Shoulder Exercises: Standing   Other  Standing Exercises finger wall crawl flexion & abduction x3       Shoulder Exercises: Pulleys   Flexion 2 minutes    ABduction 2 minutes      Shoulder Exercises: Stretch   Other Shoulder Stretches bicep stretch against table 2 x 30 sec      Manual Therapy   Manual therapy comments STM bicep    Passive ROM right shoulder 4-way PROM                  PT Education - 06/26/20 1654    Education Details Discussed self massage/trigger point release with tennis ball.    Person(s) Educated Patient    Methods Explanation;Verbal cues;Handout    Comprehension Verbalized understanding;Returned demonstration            PT Short Term Goals - 06/08/20 1328      PT SHORT TERM GOAL #1   Title Pt will be independent with AROM for her elbow, wrist, and hand    Time 4    Period Weeks    Status New    Target Date 07/06/20             PT Long Term Goals - 06/08/20 1329      PT LONG TERM GOAL #1   Title Pt will be independent with advanced HEP    Time 10    Period Weeks    Status New    Target Date 08/17/20      PT LONG TERM GOAL #2   Title Pt will improve FOTO score from 53% limitation to 36%    Baseline --    Time 10    Period Weeks    Status New    Target Date 08/17/20      PT LONG TERM GOAL #3   Title Pt will be 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 Pt with increased bicep tightness addressed with manual  therapy and provided pt with bicep stretch. Pt is progressing well with great ROM.    Personal Factors and Comorbidities Age;Comorbidity 1;Comorbidity 2    Comorbidities DM, OA    Examination-Activity Limitations Lift;Hygiene/Grooming;Reach Overhead;Caring for Others    Examination-Participation Restrictions Yard Work;Cleaning;Community Activity    Stability/Clinical Decision Making Stable/Uncomplicated    Rehab Potential Good    PT Frequency 2x / week    PT Duration 8 weeks    PT Treatment/Interventions ADLs/Self Care Home Management;Cryotherapy;Electrical Stimulation;Iontophoresis 4mg /ml Dexamethasone;Moist Heat;Ultrasound;Functional mobility training;Therapeutic activities;Therapeutic exercise;Neuromuscular re-education;Patient/family education;Manual techniques;Scar mobilization;Passive range of motion;Dry needling;Taping;Vasopneumatic Device    PT Next Visit Plan Continue PROM, AAROM for shoulder, AROM for elbow/wrist/hand prn, OK to add strengthening in 2 weeks per MD    PT Home Exercise Plan Access Code [email or web address]-added supine wand ER, supine flexion AAROM with left hand assist, supine wand chest press AAROM, table slide    Consulted and Agree with Plan of Care Patient           Patient will benefit from skilled therapeutic intervention in order to improve the following deficits and impairments:  Decreased mobility, Hypomobility, Decreased scar mobility, Decreased coordination, Decreased strength, Increased fascial restricitons, Impaired flexibility, Impaired UE functional use, Postural dysfunction  Visit Diagnosis: Stiffness of right shoulder, not elsewhere classified  Chronic right shoulder pain  S/P right rotator cuff repair     Problem List Patient Active Problem List   Diagnosis Date Noted  . Multinodular goiter 09/16/2019  . Chronic pain disorder 08/09/2018  . Diabetic polyneuropathy associated with type 2 diabetes mellitus (HCC) 08/09/2018  . Gastroparesis 12/21/2017    . GI bleed 12/06/2017  . Hepatic steatosis 12/04/2017  . Chronic systolic CHF (congestive heart failure) (HCC) 09/16/2015  . 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 Low back pain 04/03/2014  . Unspecified vitamin D deficiency 01/20/2014  . Essential hypertension, benign 01/20/2014  . Chronic anticoagulation 10/31/2013  . Tachycardia induced cardiomyopathy (HCC)   . H/O: hypothyroidism   . Obesity   . Type 2 diabetes mellitus with diabetic polyneuropathy, with long-term current use of insulin (HCC)   . Arthritis   . Dyslipidemia   . Atrial fibrillation  10/25/2013    Giordana Weinheimer April Ma L Sayge Brienza PT, DPT 06/26/2020, 4:59 PM  [facility] [address] [place], [place], [place] Phone: [phone]   Fax:  [phone]  Name: Carneshia Raker MRN: [name] Date of Birth: June 07, 1961

## 2020-06-28 ENCOUNTER — Ambulatory Visit: Payer: 59 | Admitting: Physical Therapy

## 2020-06-28 ENCOUNTER — Other Ambulatory Visit: Payer: Self-pay

## 2020-06-28 DIAGNOSIS — M25611 Stiffness of right shoulder, not elsewhere classified: Secondary | ICD-10-CM | POA: Diagnosis not present

## 2020-06-28 DIAGNOSIS — G8929 Other chronic pain: Secondary | ICD-10-CM

## 2020-06-28 DIAGNOSIS — Z9889 Other specified postprocedural states: Secondary | ICD-10-CM

## 2020-06-28 NOTE — Therapy (Signed)
[facility] [address] [place], [place], [address] Phone: [phone]   Fax:  [phone]  Physical Therapy Treatment  Patient Details  Name: Kelly Lang MRN: [id number] Date of Birth: Jul 26, 1961 Referring Provider (PT): Julieanne Cotton, [place]   Encounter Date: 06/28/2020   PT End of Session - 06/28/20 1612    Visit Number 4    Number of Visits 20    PT Start Time 1530    PT Stop Time 1610    PT Time Calculation (min) 40 min    Activity Tolerance Patient tolerated treatment well    Behavior During Therapy [facility] for tasks assessed/performed           Past Medical History:  Diagnosis Date  . Arthritis    "all over" (06/30/2017)  . Chicken pox   . Dyslipidemia   . Dysrhythmia ablation 2018   a-fib  . GERD (gastroesophageal reflux disease)   . H/O: hypothyroidism    a. thyroid biopsy 1996 with synthroid. Stopped taking rx and was loss to follow up b. normal thyroid function 10/20/13  . High cholesterol   . Hx of cardiovascular stress test    ETT/Lexiscan Myoview (12/2013):  No ischemia; not gated; low risk.  Marland Kitchen Hypertension   . Obesity   . Persistent atrial fibrillation (HCC)    a diagnosed 10/25/2013  . Psoriasis    on Skyrizi  . Seasonal allergies   . Stroke [facility]) 2016   "some speech problems since" (06/30/2017)  . 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 2/2 Afib with RVR for unknown duration (EF: 40-45%)  . Thyroid disease   . Type II diabetes mellitus (HCC)    a. hg A1c 10, newly diagnosed (10/26/13)    Past Surgical History:  Procedure Laterality Date  . ATRIAL FIBRILLATION ABLATION  06/30/2017  . ATRIAL FIBRILLATION ABLATION N/A 06/30/2017   Procedure: Atrial Fibrillation Ablation;  Surgeon: Hillis Range, MD;  Location: [facility] INVASIVE CV LAB;  Service: Cardiovascular;  Laterality: N/A;  . BICEPT TENODESIS Right 05/24/2020   Procedure: BICEPS TENODESIS;  Surgeon: Cammy Copa, MD;  Location:  [facility];  Service: Orthopedics;  Laterality: Right;  . BIOPSY THYROID  1996  . CARDIOVERSION N/A 12/07/2013   Procedure: CARDIOVERSION;  Surgeon: Everette Rank, MD;  Location: [facility] ENDOSCOPY;  Service: Cardiovascular;  Laterality: N/A;  . CARDIOVERSION N/A 03/10/2016   Procedure: CARDIOVERSION;  Surgeon: Vesta Mixer, MD;  Location: [facility] ENDOSCOPY;  Service: Cardiovascular;  Laterality: N/A;  . ESOPHAGOGASTRODUODENOSCOPY N/A 12/07/2017   Procedure: ESOPHAGOGASTRODUODENOSCOPY (EGD);  Surgeon: Hilarie Fredrickson, MD;  Location: [facility] ENDOSCOPY;  Service: Endoscopy;  Laterality: N/A;  . EYE MUSCLE SURGERY Right ~ 1966  . SHOULDER ARTHROSCOPY WITH ROTATOR CUFF REPAIR Right 05/24/2020   Procedure: right shoulder arthroscopy, rotator cuff tear repair biceps tenodesis;  Surgeon: Cammy Copa, MD;  Location: [facility];  Service: Orthopedics;  Laterality: Right;  . TEE WITHOUT CARDIOVERSION N/A 06/29/2017   Procedure: TRANSESOPHAGEAL ECHOCARDIOGRAM (TEE);  Surgeon: Pricilla Riffle, MD;  Location: [facility] ENDOSCOPY;  Service: Cardiovascular;  Laterality: N/A;  . TONSILLECTOMY  1971    There were no vitals filed for this visit.   Subjective Assessment - 06/28/20 1534    Subjective Pt reports arm feels much better today and the stretches have helped.    Limitations Lifting  [facility] Adult PT Treatment/Exercise - 06/28/20 0001      Shoulder Exercises: Seated   Flexion AAROM;Right;15 reps   with wand   Abduction AAROM;10 reps;Right    Other Seated Exercises cervical retraction x 10    Other Seated Exercises scapular clock x 10, retraction x 10      Shoulder Exercises: Standing   Other Standing Exercises finger wall crawl flexion & abduction x3       Shoulder Exercises: Pulleys   Flexion 2 minutes    Scaption 2 minutes    ABduction 2 minutes      Shoulder Exercises: ROM/Strengthening   Other ROM/Strengthening Exercises wall wash  flexion x 10, abduction x 10      Shoulder Exercises: Isometric Strengthening   Flexion 3X5"    Extension 3X5"    External Rotation 3X5"    Internal Rotation 3X5"    ABduction 3X5"    ADduction 3X5"      Manual Therapy   Manual Therapy Joint mobilization    Joint Mobilization GH inferior mob grade II to III    Passive ROM right shoulder 4-way PROM                  PT Education - 06/28/20 1612    Education Details Discussed posture for shoulders and neck.    Person(s) Educated Patient    Methods Explanation;Tactile cues;Verbal cues    Comprehension Verbalized understanding;Returned demonstration;Tactile cues required;Verbal cues required            PT Short Term Goals - 06/08/20 1328      PT SHORT TERM GOAL #1   Title Pt will be independent with AROM for her elbow, wrist, and hand    Time 4    Period Weeks    Status New    Target Date 07/06/20             PT Long Term Goals - 06/08/20 1329      PT LONG TERM GOAL #1   Title Pt will be independent with advanced HEP    Time 10    Period Weeks    Status New    Target Date 08/17/20      PT LONG TERM GOAL #2   Title Pt will improve FOTO score from 53% limitation to 36%    Baseline --    Time 10    Period Weeks    Status New    Target Date 08/17/20      PT LONG TERM GOAL #3   Title 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 Continued AAROM and  initiated isometric strengthening.    Personal Factors and Comorbidities Age;Comorbidity 1;Comorbidity 2    Comorbidities DM, OA    Examination-Activity Limitations Lift;Hygiene/Grooming;Reach Overhead;Caring for Others    Examination-Participation Restrictions Yard Work;Cleaning;Community Activity    Stability/Clinical Decision Making Stable/Uncomplicated    Rehab Potential Good    PT Frequency 2x / week    PT Duration 8 weeks    PT Treatment/Interventions ADLs/Self Care Home Management;Cryotherapy;Electrical Stimulation;Iontophoresis 4mg /ml Dexamethasone;Moist Heat;Ultrasound;Functional mobility training;Therapeutic activities;Therapeutic exercise;Neuromuscular re-education;Patient/family education;Manual techniques;Scar mobilization;Passive range of motion;Dry needling;Taping;Vasopneumatic Device    PT Next Visit Plan Continue PROM, AAROM for shoulder, AROM for elbow/wrist/hand prn, OK to add strengthening in 2 weeks per MD    PT Home Exercise Plan Access Code [email or web address]-added supine wand ER, supine flexion AAROM with left hand assist, supine wand chest press AAROM, table slide    Consulted and Agree with Plan of Care Patient           Patient will benefit from skilled therapeutic intervention in order to improve the following deficits and impairments:  Decreased mobility, Hypomobility, Decreased scar mobility, Decreased coordination, Decreased strength, Increased fascial restricitons, Impaired flexibility, Impaired UE functional use, Postural dysfunction  Visit Diagnosis: Stiffness of right shoulder, not elsewhere classified  Chronic right shoulder pain  S/P right rotator cuff repair     Problem List Patient Active Problem List   Diagnosis Date Noted  . Multinodular goiter 09/16/2019  . Chronic pain disorder 08/09/2018  . Diabetic polyneuropathy associated with type 2 diabetes mellitus (HCC) 08/09/2018  . Gastroparesis 12/21/2017  . GI bleed 12/06/2017  . Hepatic steatosis  12/04/2017  . Chronic systolic CHF (congestive heart failure) (HCC) 09/16/2015  . 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 Low back pain 04/03/2014  . Unspecified vitamin D deficiency 01/20/2014  . Essential hypertension, benign 01/20/2014  . Chronic anticoagulation 10/31/2013  . Tachycardia induced cardiomyopathy (HCC)   . H/O: hypothyroidism   . Obesity   . Type 2 diabetes mellitus with diabetic polyneuropathy, with long-term current use of insulin (HCC)   . Arthritis   . Dyslipidemia   . Atrial fibrillation  10/25/2013    [facility] [address] [place] PT, DPT 06/28/2020, 4:24 PM  [facility] [address] [place], [place], [place] Phone: [phone]   Fax:  [phone]  Name: Jenisa Monty MRN: [name] Date of Birth: [birth year]

## 2020-07-03 ENCOUNTER — Other Ambulatory Visit: Payer: Self-pay

## 2020-07-03 ENCOUNTER — Ambulatory Visit: Payer: 59 | Attending: Surgical | Admitting: Physical Therapy

## 2020-07-03 DIAGNOSIS — M25611 Stiffness of right shoulder, not elsewhere classified: Secondary | ICD-10-CM | POA: Diagnosis not present

## 2020-07-03 DIAGNOSIS — Z9889 Other specified postprocedural states: Secondary | ICD-10-CM | POA: Insufficient documentation

## 2020-07-03 DIAGNOSIS — G8929 Other chronic pain: Secondary | ICD-10-CM | POA: Diagnosis present

## 2020-07-03 DIAGNOSIS — M25511 Pain in right shoulder: Secondary | ICD-10-CM | POA: Insufficient documentation

## 2020-07-03 MED FILL — XARELTO 20 MG TABLET: 20 | 30 days supply | Qty: 30 | Fill #7

## 2020-07-03 MED FILL — LANTUS SOLOSTAR 100 UNITS/M: 100 | 22 days supply | Qty: 30 | Fill #3

## 2020-07-03 MED FILL — DULoxetine HCL 20 MG CPEP: 20 | 30 days supply | Qty: 30 | Fill #2

## 2020-07-03 MED FILL — metFORMIN HCL 1000 MG TABS: 1000 | 30 days supply | Qty: 60 | Fill #3

## 2020-07-03 MED FILL — glipiZIDE 5 MG TABS: 5 | 30 days supply | Qty: 15 | Fill #2

## 2020-07-03 MED FILL — DICLOFENAC SODIUM 1% GEL: 1 | 12 days supply | Qty: 100 | Fill #5

## 2020-07-03 NOTE — Therapy (Signed)
[facility] Outpatient Rehabilitation [facility] [address] [place], [place], [address] Phone: [phone]   Fax:  [phone]  Physical Therapy Treatment  Patient Details  Name: Kelly Lang MRN: [id number] Date of Birth: 31-Oct-1961 Referring Provider (PT): Julieanne Cotton, [place]   Encounter Date: 07/03/2020   PT End of Session - 07/03/20 1655    Visit Number 5    Number of Visits 20    PT Start Time 1622    PT Stop Time 1700    PT Time Calculation (min) 38 min    Activity Tolerance Patient tolerated treatment well    Behavior During Therapy [facility] for tasks assessed/performed           Past Medical History:  Diagnosis Date  . Arthritis    "all over" (06/30/2017)  . Chicken pox   . Dyslipidemia   . Dysrhythmia ablation 2018   a-fib  . GERD (gastroesophageal reflux disease)   . H/O: hypothyroidism    a. thyroid biopsy 1996 with synthroid. Stopped taking rx and was loss to follow up b. normal thyroid function 10/20/13  . High cholesterol   . Hx of cardiovascular stress test    ETT/Lexiscan Myoview (12/2013):  No ischemia; not gated; low risk.  Marland Kitchen Hypertension   . Obesity   . Persistent atrial fibrillation (HCC)    a diagnosed 10/25/2013  . Psoriasis    on Skyrizi  . Seasonal allergies   . Stroke [facility]) 2016   "some speech problems since" (06/30/2017)  . 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 2/2 Afib with RVR for unknown duration (EF: 40-45%)  . Thyroid disease   . Type II diabetes mellitus (HCC)    a. hg A1c 10, newly diagnosed (10/26/13)    Past Surgical History:  Procedure Laterality Date  . ATRIAL FIBRILLATION ABLATION  06/30/2017  . ATRIAL FIBRILLATION ABLATION N/A 06/30/2017   Procedure: Atrial Fibrillation Ablation;  Surgeon: Hillis Range, MD;  Location: [facility] INVASIVE CV LAB;  Service: Cardiovascular;  Laterality: N/A;  . BICEPT TENODESIS Right 05/24/2020   Procedure: BICEPS TENODESIS;  Surgeon: Cammy Copa, MD;  Location:  [facility];  Service: Orthopedics;  Laterality: Right;  . BIOPSY THYROID  1996  . CARDIOVERSION N/A 12/07/2013   Procedure: CARDIOVERSION;  Surgeon: Everette Rank, MD;  Location: [facility] ENDOSCOPY;  Service: Cardiovascular;  Laterality: N/A;  . CARDIOVERSION N/A 03/10/2016   Procedure: CARDIOVERSION;  Surgeon: Vesta Mixer, MD;  Location: [facility] ENDOSCOPY;  Service: Cardiovascular;  Laterality: N/A;  . ESOPHAGOGASTRODUODENOSCOPY N/A 12/07/2017   Procedure: ESOPHAGOGASTRODUODENOSCOPY (EGD);  Surgeon: Hilarie Fredrickson, MD;  Location: [facility] ENDOSCOPY;  Service: Endoscopy;  Laterality: N/A;  . EYE MUSCLE SURGERY Right ~ 1966  . SHOULDER ARTHROSCOPY WITH ROTATOR CUFF REPAIR Right 05/24/2020   Procedure: right shoulder arthroscopy, rotator cuff tear repair biceps tenodesis;  Surgeon: Cammy Copa, MD;  Location: [facility];  Service: Orthopedics;  Laterality: Right;  . TEE WITHOUT CARDIOVERSION N/A 06/29/2017   Procedure: TRANSESOPHAGEAL ECHOCARDIOGRAM (TEE);  Surgeon: Pricilla Riffle, MD;  Location: [facility] ENDOSCOPY;  Service: Cardiovascular;  Laterality: N/A;  . TONSILLECTOMY  1971    There were no vitals filed for this visit.   Subjective Assessment - 07/03/20 1624    Subjective Pt reports no issues with her shoulder.    Limitations Lifting  [facility] Adult PT Treatment/Exercise - 07/03/20 0001      Shoulder Exercises: Supine   Flexion Strengthening;Right;AROM;10 reps      Shoulder Exercises: Sidelying   External Rotation Strengthening;AROM;Right;10 reps    ABduction Strengthening;AROM;Right;10 reps      Shoulder Exercises: Standing   Row Strengthening;Both;Theraband;20 reps    Theraband Level (Shoulder Row) Level 1 (Yellow)    Other Standing Exercises pendulum x 10      Shoulder Exercises: ROM/Strengthening   Wall Wash flexion, abduction, side to side, circles CW & CCW x 10 each    Wall Pushups 10 reps    Modified Plank 30  seconds   wall plank   Other ROM/Strengthening Exercises serratus anterior forearm pushup    Other ROM/Strengthening Exercises serratus anterior foam roll x 10      Manual Therapy   Manual Therapy Joint mobilization    Joint Mobilization GH inferior mob grade II to III    Passive ROM right shoulder 4-way PROM                    PT Short Term Goals - 06/08/20 1328      PT SHORT TERM GOAL #1   Title Pt will be independent with AROM for her elbow, wrist, and hand    Time 4    Period Weeks    Status New    Target Date 07/06/20             PT Long Term Goals - 06/08/20 1329      PT LONG TERM GOAL #1   Title Pt will be independent with advanced HEP    Time 10    Period Weeks    Status New    Target Date 08/17/20      PT LONG TERM GOAL #2   Title Pt will improve FOTO score from 53% limitation to 36%    Baseline --    Time 10    Period Weeks    Status New    Target Date 08/17/20      PT LONG TERM GOAL #3   Title Pt will be 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 Pt continues to progress with no issues. Initiated AROM this session to increase strength. Pt performs well with good scapulohumeral rhythm. Continue to progress pt's strengthening.    Personal Factors and Comorbidities Age;Comorbidity 1;Comorbidity 2    Comorbidities DM, OA    Examination-Activity Limitations Lift;Hygiene/Grooming;Reach Overhead;Caring  for Others    Examination-Participation Restrictions Yard Work;Cleaning;Community Activity    Rehab Potential Good  PT Frequency 2x / week    PT Duration 8 weeks    PT Treatment/Interventions ADLs/Self Care Home Management;Cryotherapy;Electrical Stimulation;Iontophoresis 4mg /ml Dexamethasone;Moist Heat;Ultrasound;Functional mobility training;Therapeutic activities;Therapeutic exercise;Neuromuscular re-education;Patient/family education;Manual techniques;Scar mobilization;Passive range of motion;Dry needling;Taping;Vasopneumatic Device    PT Next Visit Plan Continue PROM and manual. Continue to progress AROM and strengthening.    PT Home Exercise Plan [phone]           Patient will benefit from skilled therapeutic intervention in order to improve the following deficits and impairments:  Decreased mobility, Hypomobility, Decreased scar mobility, Decreased coordination, Decreased strength, Increased fascial restricitons, Impaired flexibility, Impaired UE functional use, Postural dysfunction  Visit Diagnosis: Stiffness of right shoulder, not elsewhere classified  Chronic right shoulder pain  S/P right rotator cuff repair     Problem List Patient Active Problem List   Diagnosis Date Noted  . Multinodular goiter 09/16/2019  . Chronic pain disorder 08/09/2018  . Diabetic polyneuropathy associated with type 2 diabetes mellitus (HCC) 08/09/2018  . Gastroparesis 12/21/2017  . GI bleed 12/06/2017  . Hepatic steatosis 12/04/2017  . Chronic systolic CHF (congestive heart failure) (HCC) 09/16/2015  . 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 Low back pain 04/03/2014  . Unspecified vitamin D deficiency 01/20/2014  . Essential hypertension, benign 01/20/2014  . Chronic anticoagulation 10/31/2013  . Tachycardia induced cardiomyopathy (HCC)   . H/O: hypothyroidism   . Obesity   . Type 2 diabetes mellitus with diabetic polyneuropathy, with  long-term current use of insulin (HCC)   . Arthritis   . Dyslipidemia   . Atrial fibrillation  10/25/2013    Kelly Lang PT, DPT 07/03/2020, 5:01 PM  [facility] [address] [place], [place], [place] Phone: [phone]   Fax:  [phone]  Name: Kelly Lang MRN: [name] Date of Birth: [birth year]

## 2020-07-05 ENCOUNTER — Ambulatory Visit: Payer: 59 | Admitting: Physical Therapy

## 2020-07-05 ENCOUNTER — Other Ambulatory Visit: Payer: Self-pay

## 2020-07-05 DIAGNOSIS — Z9889 Other specified postprocedural states: Secondary | ICD-10-CM

## 2020-07-05 DIAGNOSIS — M25611 Stiffness of right shoulder, not elsewhere classified: Secondary | ICD-10-CM | POA: Diagnosis not present

## 2020-07-05 DIAGNOSIS — G8929 Other chronic pain: Secondary | ICD-10-CM

## 2020-07-05 DIAGNOSIS — M25511 Pain in right shoulder: Secondary | ICD-10-CM

## 2020-07-05 NOTE — Therapy (Signed)
[facility] [address] [place], [place], [address] Phone: [phone]   Fax:  [phone]  Physical Therapy Treatment  Patient Details  Name: Kelly Lang MRN: [id number] Date of Birth: 06-03-1961 Referring Provider (PT): Julieanne Cotton, [place]   Encounter Date: 07/05/2020   PT End of Session - 07/05/20 1610    Visit Number 6    Number of Visits 20    PT Start Time 1612    PT Stop Time 1658    PT Time Calculation (min) 46 min    Activity Tolerance Patient tolerated treatment well    Behavior During Therapy [facility] for tasks assessed/performed           Past Medical History:  Diagnosis Date  . Arthritis    "all over" (06/30/2017)  . Chicken pox   . Dyslipidemia   . Dysrhythmia ablation 2018   a-fib  . GERD (gastroesophageal reflux disease)   . H/O: hypothyroidism    a. thyroid biopsy 1996 with synthroid. Stopped taking rx and was loss to follow up b. normal thyroid function 10/20/13  . High cholesterol   . Hx of cardiovascular stress test    ETT/Lexiscan Myoview (12/2013):  No ischemia; not gated; low risk.  Marland Kitchen Hypertension   . Obesity   . Persistent atrial fibrillation (HCC)    a diagnosed 10/25/2013  . Psoriasis    on Skyrizi  . Seasonal allergies   . Stroke [facility]) 2016   "some speech problems since" (06/30/2017)  . 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 2/2 Afib with RVR for unknown duration (EF: 40-45%)  . Thyroid disease   . Type II diabetes mellitus (HCC)    a. hg A1c 10, newly diagnosed (10/26/13)    Past Surgical History:  Procedure Laterality Date  . ATRIAL FIBRILLATION ABLATION  06/30/2017  . ATRIAL FIBRILLATION ABLATION N/A 06/30/2017   Procedure: Atrial Fibrillation Ablation;  Surgeon: Hillis Range, MD;  Location: [facility] INVASIVE CV LAB;  Service: Cardiovascular;  Laterality: N/A;  . BICEPT TENODESIS Right 05/24/2020   Procedure: BICEPS TENODESIS;  Surgeon: Cammy Copa, MD;  Location:  [facility];  Service: Orthopedics;  Laterality: Right;  . BIOPSY THYROID  1996  . CARDIOVERSION N/A 12/07/2013   Procedure: CARDIOVERSION;  Surgeon: Everette Rank, MD;  Location: [facility] ENDOSCOPY;  Service: Cardiovascular;  Laterality: N/A;  . CARDIOVERSION N/A 03/10/2016   Procedure: CARDIOVERSION;  Surgeon: Vesta Mixer, MD;  Location: [facility] ENDOSCOPY;  Service: Cardiovascular;  Laterality: N/A;  . ESOPHAGOGASTRODUODENOSCOPY N/A 12/07/2017   Procedure: ESOPHAGOGASTRODUODENOSCOPY (EGD);  Surgeon: Hilarie Fredrickson, MD;  Location: [facility] ENDOSCOPY;  Service: Endoscopy;  Laterality: N/A;  . EYE MUSCLE SURGERY Right ~ 1966  . SHOULDER ARTHROSCOPY WITH ROTATOR CUFF REPAIR Right 05/24/2020   Procedure: right shoulder arthroscopy, rotator cuff tear repair biceps tenodesis;  Surgeon: Cammy Copa, MD;  Location: [facility];  Service: Orthopedics;  Laterality: Right;  . TEE WITHOUT CARDIOVERSION N/A 06/29/2017   Procedure: TRANSESOPHAGEAL ECHOCARDIOGRAM (TEE);  Surgeon: Pricilla Riffle, MD;  Location: [facility] ENDOSCOPY;  Service: Cardiovascular;  Laterality: N/A;  . TONSILLECTOMY  1971    There were no vitals filed for this visit.   Subjective Assessment - 07/05/20 1613    Subjective Pt reports a little soreness after last session but it's feeling better today.    Limitations Lifting    Currently in Pain? No/denies  [facility] Adult PT Treatment/Exercise - 07/05/20 0001      Shoulder Exercises: Supine   Horizontal ABduction Strengthening;Both;10 reps;Theraband    Theraband Level (Shoulder Horizontal ABduction) Level 1 (Yellow)    Flexion Strengthening;Right;5 reps;Theraband    Theraband Level (Shoulder Flexion) Level 1 (Yellow)    ABduction Strengthening;Right;10 reps;Theraband    Theraband Level (Shoulder ABduction) Level 1 (Yellow)      Shoulder Exercises: Seated   Flexion AAROM;Right;15 reps;AROM   pain with initial 10; less pain with  cues to stabilize scapu   Flexion Limitations with wand    Abduction AROM;Right;Strengthening;10 reps      Shoulder Exercises: Standing   Other Standing Exercises finger wall crawl flexion x 3, abduction x 3    Other Standing Exercises flexion with foam roll x 10      Shoulder Exercises: ROM/Strengthening   UBE (Upper Arm Bike) 2.5 min forward, 2.5 min back     Wall Pushups 10 reps      Modalities   Modalities Vasopneumatic      Vasopneumatic   Number Minutes Vasopneumatic  10 minutes    Vasopnuematic Location  Shoulder    Vasopneumatic Pressure Low    Vasopneumatic Temperature  36      Manual Therapy   Manual Therapy Joint mobilization    Joint Mobilization GH inferior mob grade II to III    Passive ROM right shoulder flexion & abduction                  PT Education - 07/05/20 1659    Education Details Discussed strengthening in pain free range    Person(s) Educated Patient    Methods Explanation;Demonstration;Tactile cues;Verbal cues    Comprehension Verbalized understanding;Returned demonstration;Verbal cues required;Tactile cues required            PT Short Term Goals - 07/05/20 1701      PT SHORT TERM GOAL #1   Title Pt will be independent with AROM for her elbow, wrist, and hand    Time 4    Period Weeks    Status Achieved    Target Date 07/06/20             PT Long Term Goals - 06/08/20 1329      PT LONG TERM GOAL #1   Title Pt will be independent with advanced HEP    Time 10    Period Weeks    Status New    Target Date 08/17/20      PT LONG TERM GOAL #2   Title Pt will improve FOTO score from 53% limitation to 36%    Baseline --    Time 10    Period Weeks    Status New    Target Date 08/17/20      PT LONG TERM GOAL #3   Title 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 Some scapular and upper trap compensation noted with flexion this session. Continue to progresss pt's strengthening as able. Pt almost with full shoulder ROM.    Personal Factors and Comorbidities Age;Comorbidity 1;Comorbidity 2    Comorbidities DM, OA    Examination-Activity Limitations Lift;Hygiene/Grooming;Reach Overhead;Caring for Others    Examination-Participation Restrictions Yard Work;Cleaning;Community Activity    Rehab Potential Good    PT Frequency 2x / week    PT Duration 8 weeks    PT Treatment/Interventions ADLs/Self Care Home Management;Cryotherapy;Electrical Stimulation;Iontophoresis 4mg /ml Dexamethasone;Moist Heat;Ultrasound;Functional mobility training;Therapeutic activities;Therapeutic exercise;Neuromuscular re-education;Patient/family education;Manual techniques;Scar mobilization;Passive range of motion;Dry needling;Taping;Vasopneumatic Device    PT Next Visit Plan Continue PROM and manual. Continue to progress AROM and strengthening. Consider vaso. Perform 6th visit FOTO    PT Home Exercise Plan    Consulted and Agree with Plan of Care Patient           Patient will benefit from skilled therapeutic intervention in order to improve the following deficits and impairments:  Decreased mobility, Hypomobility, Decreased scar mobility, Decreased coordination, Decreased strength, Increased fascial restricitons, Impaired flexibility, Impaired UE functional use, Postural dysfunction  Visit Diagnosis: Stiffness of right  shoulder, not elsewhere classified  Chronic right shoulder pain  S/P right rotator cuff repair     Problem List Patient Active Problem List   Diagnosis Date Noted  . Multinodular goiter 09/16/2019  . Chronic pain disorder 08/09/2018  . Diabetic polyneuropathy associated with type 2 diabetes mellitus (HCC) 08/09/2018  . Gastroparesis 12/21/2017  . GI bleed 12/06/2017  . Hepatic steatosis 12/04/2017  . Chronic systolic CHF (congestive heart failure) (HCC) 09/16/2015  . 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 Low back pain 04/03/2014  . Unspecified vitamin D deficiency 01/20/2014  . Essential hypertension, benign 01/20/2014  . Chronic anticoagulation 10/31/2013  . Tachycardia induced cardiomyopathy (HCC)   . H/O: hypothyroidism   . Obesity   . Type 2 diabetes mellitus with diabetic polyneuropathy, with long-term current use of insulin (HCC)   . Arthritis   . Dyslipidemia   . Atrial fibrillation  10/25/2013    [facility] April Ma L Desire Fulp PT, DPT 07/05/2020, 5:04 PM  [facility] [address] [place], [place], [place] Phone: [phone]   Fax:  [phone]  Name: Kelly Lang MRN: [name] Date of Birth: 1961-10-18

## 2020-07-10 ENCOUNTER — Other Ambulatory Visit: Payer: Self-pay

## 2020-07-10 ENCOUNTER — Ambulatory Visit: Payer: 59 | Admitting: Physical Therapy

## 2020-07-10 DIAGNOSIS — M25611 Stiffness of right shoulder, not elsewhere classified: Secondary | ICD-10-CM | POA: Diagnosis not present

## 2020-07-10 DIAGNOSIS — G8929 Other chronic pain: Secondary | ICD-10-CM

## 2020-07-10 DIAGNOSIS — Z9889 Other specified postprocedural states: Secondary | ICD-10-CM

## 2020-07-10 NOTE — Therapy (Signed)
[facility] [address] [place], [place], [address] Phone: [phone]   Fax:  [phone]  Physical Therapy Treatment  Patient Details  Name: Kelly Lang MRN: [id number] Date of Birth: 1961-06-03 Referring Provider (PT): Julieanne Cotton, [place]   Encounter Date: 07/10/2020   PT End of Session - 07/10/20 1701    Visit Number 7    Number of Visits 20    PT Start Time 1620    PT Stop Time 1707    PT Time Calculation (min) 47 min    Activity Tolerance Patient tolerated treatment well    Behavior During Therapy [facility] for tasks assessed/performed           Past Medical History:  Diagnosis Date  . Arthritis    "all over" (06/30/2017)  . Chicken pox   . Dyslipidemia   . Dysrhythmia ablation 2018   a-fib  . GERD (gastroesophageal reflux disease)   . H/O: hypothyroidism    a. thyroid biopsy 1996 with synthroid. Stopped taking rx and was loss to follow up b. normal thyroid function 10/20/13  . High cholesterol   . Hx of cardiovascular stress test    ETT/Lexiscan Myoview (12/2013):  No ischemia; not gated; low risk.  Marland Kitchen Hypertension   . Obesity   . Persistent atrial fibrillation (HCC)    a diagnosed 10/25/2013  . Psoriasis    on Skyrizi  . Seasonal allergies   . Stroke [facility]) 2016   "some speech problems since" (06/30/2017)  . 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 2/2 Afib with RVR for unknown duration (EF: 40-45%)  . Thyroid disease   . Type II diabetes mellitus (HCC)    a. hg A1c 10, newly diagnosed (10/26/13)    Past Surgical History:  Procedure Laterality Date  . ATRIAL FIBRILLATION ABLATION  06/30/2017  . ATRIAL FIBRILLATION ABLATION N/A 06/30/2017   Procedure: Atrial Fibrillation Ablation;  Surgeon: Hillis Range, MD;  Location: [facility] INVASIVE CV LAB;  Service: Cardiovascular;  Laterality: N/A;  . BICEPT TENODESIS Right 05/24/2020   Procedure: BICEPS TENODESIS;  Surgeon: Cammy Copa, MD;  Location:  [facility];  Service: Orthopedics;  Laterality: Right;  . BIOPSY THYROID  1996  . CARDIOVERSION N/A 12/07/2013   Procedure: CARDIOVERSION;  Surgeon: Everette Rank, MD;  Location: [facility] ENDOSCOPY;  Service: Cardiovascular;  Laterality: N/A;  . CARDIOVERSION N/A 03/10/2016   Procedure: CARDIOVERSION;  Surgeon: Vesta Mixer, MD;  Location: [facility] ENDOSCOPY;  Service: Cardiovascular;  Laterality: N/A;  . ESOPHAGOGASTRODUODENOSCOPY N/A 12/07/2017   Procedure: ESOPHAGOGASTRODUODENOSCOPY (EGD);  Surgeon: Hilarie Fredrickson, MD;  Location: [facility] ENDOSCOPY;  Service: Endoscopy;  Laterality: N/A;  . EYE MUSCLE SURGERY Right ~ 1966  . SHOULDER ARTHROSCOPY WITH ROTATOR CUFF REPAIR Right 05/24/2020   Procedure: right shoulder arthroscopy, rotator cuff tear repair biceps tenodesis;  Surgeon: Cammy Copa, MD;  Location: [facility];  Service: Orthopedics;  Laterality: Right;  . TEE WITHOUT CARDIOVERSION N/A 06/29/2017   Procedure: TRANSESOPHAGEAL ECHOCARDIOGRAM (TEE);  Surgeon: Pricilla Riffle, MD;  Location: [facility] ENDOSCOPY;  Service: Cardiovascular;  Laterality: N/A;  . TONSILLECTOMY  1971    There were no vitals filed for this visit.   Subjective Assessment - 07/10/20 1701    Subjective Pt reports some soreness from last session but no current complaints. Reports continued difficulty with performing yellow tband exercises.    Limitations Lifting    Currently in Pain? No/denies  [facility] Adult PT Treatment/Exercise - 07/10/20 0001      Shoulder Exercises: Supine   Horizontal ABduction Strengthening;Both;Theraband;20 reps    Theraband Level (Shoulder Horizontal ABduction) Level 2 (Red)      Shoulder Exercises: Seated   Flexion Right;AROM;10 reps    Abduction AROM;Right;Strengthening;10 reps      Shoulder Exercises: Sidelying   External Rotation Strengthening;Right;10 reps;Weights    External Rotation Weight (lbs) 2    Flexion  AROM;Strengthening;Right;10 reps    ABduction Strengthening;Right;10 reps;AROM    Other Sidelying Exercises rhythmic stab x 1 min in flexion & abduction      Shoulder Exercises: Standing   Flexion Strengthening;Right;10 reps;AROM    ABduction Strengthening;Right;10 reps;AROM      Shoulder Exercises: ROM/Strengthening   UBE (Upper Arm Bike) 2.5 min forward, 2.5 min back       Vasopneumatic   Number Minutes Vasopneumatic  10 minutes    Vasopnuematic Location  Shoulder    Vasopneumatic Pressure Low    Vasopneumatic Temperature  34      Manual Therapy   Passive ROM right shoulder flexion & abduction                    PT Short Term Goals - 07/05/20 1701      PT SHORT TERM GOAL #1   Title Pt will be independent with AROM for her elbow, wrist, and hand    Time 4    Period Weeks    Status Achieved    Target Date 07/06/20             PT Long Term Goals - 06/08/20 1329      PT LONG TERM GOAL #1   Title Pt will be independent with advanced HEP    Time 10    Period Weeks    Status New    Target Date 08/17/20      PT LONG TERM GOAL #2   Title Pt will improve FOTO score from 53% limitation to 36%    Baseline --    Time 10    Period Weeks    Status New    Target Date 08/17/20      PT LONG TERM GOAL #3   Title Pt will be 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 Continued to progress pt's strengthening as able in gravity eliminiated and against gravity positions. Shoulder abduction more limited than flexion. Pt able to maintain good scapulothoracic rhythm in seated until ~80 deg of flexion.    Personal  Factors and Comorbidities Age;Comorbidity 1;Comorbidity 2    Comorbidities DM, OA    Examination-Activity Limitations Lift;Hygiene/Grooming;Reach Overhead;Caring for Others    Examination-Participation Restrictions Yard Work;Cleaning;Community Activity    Rehab Potential Good    PT Frequency 2x / week    PT Duration 8 weeks    PT Treatment/Interventions ADLs/Self Care Home Management;Cryotherapy;Electrical Stimulation;Iontophoresis 4mg /ml Dexamethasone;Moist Heat;Ultrasound;Functional mobility training;Therapeutic activities;Therapeutic exercise;Neuromuscular re-education;Patient/family education;Manual techniques;Scar mobilization;Passive range of motion;Dry needling;Taping;Vasopneumatic Device    PT Next Visit Plan Continue to progress AROM and strengthening. Consider vaso.    PT Home Exercise Plan [email or web address]    Consulted and Agree with Plan of Care Patient           Patient will benefit from skilled therapeutic intervention in order to improve the following deficits and impairments:  Decreased mobility, Hypomobility, Decreased scar mobility, Decreased coordination, Decreased strength, Increased fascial restricitons, Impaired flexibility, Impaired UE functional use, Postural dysfunction  Visit Diagnosis: Stiffness of right shoulder, not elsewhere classified  Chronic right shoulder pain  S/P right rotator cuff repair     Problem List Patient Active Problem List   Diagnosis Date Noted  . Multinodular goiter 09/16/2019  . Chronic pain disorder 08/09/2018  . Diabetic polyneuropathy associated with type 2 diabetes mellitus (HCC) 08/09/2018  . Gastroparesis 12/21/2017  . GI bleed 12/06/2017  . Hepatic steatosis 12/04/2017  . Chronic  systolic CHF (congestive heart failure) (HCC) 09/16/2015  . 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 Low back pain 04/03/2014  . Unspecified vitamin D deficiency 01/20/2014  . Essential hypertension, benign 01/20/2014  . Chronic anticoagulation 10/31/2013  . Tachycardia induced cardiomyopathy (HCC)   . H/O: hypothyroidism   . Obesity   . Type 2 diabetes mellitus with diabetic polyneuropathy, with long-term current use of insulin (HCC)   . Arthritis   . Dyslipidemia   . Atrial fibrillation  10/25/2013    [facility] [address] [place] PT, DPT 07/10/2020, 5:09 PM  [facility] [address] [place], [place], [place] Phone: [phone]   Fax:  [phone]  Name: Kelly Lang MRN: [name] Date of Birth: 1961/05/13

## 2020-07-12 ENCOUNTER — Other Ambulatory Visit: Payer: Self-pay

## 2020-07-12 ENCOUNTER — Ambulatory Visit: Payer: 59 | Admitting: Physical Therapy

## 2020-07-12 DIAGNOSIS — M25511 Pain in right shoulder: Secondary | ICD-10-CM

## 2020-07-12 DIAGNOSIS — M25611 Stiffness of right shoulder, not elsewhere classified: Secondary | ICD-10-CM

## 2020-07-12 DIAGNOSIS — G8929 Other chronic pain: Secondary | ICD-10-CM

## 2020-07-12 DIAGNOSIS — Z9889 Other specified postprocedural states: Secondary | ICD-10-CM

## 2020-07-12 NOTE — Therapy (Signed)
[facility] Outpatient Rehabilitation [facility] [address] [place], [place], [address] Phone: [phone]   Fax:  [phone]  Physical Therapy Treatment  Patient Details  Name: Kelly Lang MRN: [id number] Date of Birth: 03/26/1961 Referring Provider (PT): Julieanne Cotton, [place]   Encounter Date: 07/12/2020   PT End of Session - 07/12/20 1607    Visit Number 8    Number of Visits 20    PT Start Time 1610    PT Stop Time 1652    PT Time Calculation (min) 42 min    Activity Tolerance Patient tolerated treatment well    Behavior During Therapy [facility] for tasks assessed/performed           Past Medical History:  Diagnosis Date  . Arthritis    "all over" (06/30/2017)  . Chicken pox   . Dyslipidemia   . Dysrhythmia ablation 2018   a-fib  . GERD (gastroesophageal reflux disease)   . H/O: hypothyroidism    a. thyroid biopsy 1996 with synthroid. Stopped taking rx and was loss to follow up b. normal thyroid function 10/20/13  . High cholesterol   . Hx of cardiovascular stress test    ETT/Lexiscan Myoview (12/2013):  No ischemia; not gated; low risk.  Marland Kitchen Hypertension   . Obesity   . Persistent atrial fibrillation (HCC)    a diagnosed 10/25/2013  . Psoriasis    on Skyrizi  . Seasonal allergies   . Stroke [facility]) 2016   "some speech problems since" (06/30/2017)  . 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 2/2 Afib with RVR for unknown duration (EF: 40-45%)  . Thyroid disease   . Type II diabetes mellitus (HCC)    a. hg A1c 10, newly diagnosed (10/26/13)    Past Surgical History:  Procedure Laterality Date  . ATRIAL FIBRILLATION ABLATION  06/30/2017  . ATRIAL FIBRILLATION ABLATION N/A 06/30/2017   Procedure: Atrial Fibrillation Ablation;  Surgeon: Hillis Range, MD;  Location: [facility] INVASIVE CV LAB;  Service: Cardiovascular;  Laterality: N/A;  . BICEPT TENODESIS Right 05/24/2020   Procedure: BICEPS TENODESIS;  Surgeon: Cammy Copa, MD;  Location:  [facility];  Service: Orthopedics;  Laterality: Right;  . BIOPSY THYROID  1996  . CARDIOVERSION N/A 12/07/2013   Procedure: CARDIOVERSION;  Surgeon: Everette Rank, MD;  Location: [facility] ENDOSCOPY;  Service: Cardiovascular;  Laterality: N/A;  . CARDIOVERSION N/A 03/10/2016   Procedure: CARDIOVERSION;  Surgeon: Vesta Mixer, MD;  Location: [facility] ENDOSCOPY;  Service: Cardiovascular;  Laterality: N/A;  . ESOPHAGOGASTRODUODENOSCOPY N/A 12/07/2017   Procedure: ESOPHAGOGASTRODUODENOSCOPY (EGD);  Surgeon: Hilarie Fredrickson, MD;  Location: [facility] ENDOSCOPY;  Service: Endoscopy;  Laterality: N/A;  . EYE MUSCLE SURGERY Right ~ 1966  . SHOULDER ARTHROSCOPY WITH ROTATOR CUFF REPAIR Right 05/24/2020   Procedure: right shoulder arthroscopy, rotator cuff tear repair biceps tenodesis;  Surgeon: Cammy Copa, MD;  Location: [facility];  Service: Orthopedics;  Laterality: Right;  . TEE WITHOUT CARDIOVERSION N/A 06/29/2017   Procedure: TRANSESOPHAGEAL ECHOCARDIOGRAM (TEE);  Surgeon: Pricilla Riffle, MD;  Location: [facility] ENDOSCOPY;  Service: Cardiovascular;  Laterality: N/A;  . TONSILLECTOMY  1971    There were no vitals filed for this visit.   Subjective Assessment - 07/12/20 1610    Subjective Pt states she's been able to lift her UE up higher.    Limitations Lifting    Currently in Pain? No/denies  [facility] Adult PT Treatment/Exercise - 07/12/20 0001      Shoulder Exercises: Supine   Flexion Strengthening;Right;10 reps      Shoulder Exercises: Seated   Flexion Right;AROM;10 reps    Abduction AROM;Right;Strengthening;10 reps      Shoulder Exercises: Sidelying   Flexion Strengthening;Right;10 reps    Flexion Weight (lbs) 1    ABduction Strengthening;Right;10 reps;Weights    ABduction Weight (lbs) 1      Shoulder Exercises: Pulleys   Flexion 2 minutes    ABduction 2 minutes      Shoulder Exercises: ROM/Strengthening   Wall Wash abduction,  side to side, circles CW & CCW x 10 each    Wall Pushups 10 reps      Shoulder Exercises: Body Blade   Flexion 30 seconds    ABduction 30 seconds    External Rotation 30 seconds      Vasopneumatic   Number Minutes Vasopneumatic  10 minutes    Vasopnuematic Location  Shoulder    Vasopneumatic Pressure Low    Vasopneumatic Temperature  34      Manual Therapy   Passive ROM right shoulder abduction                    PT Short Term Goals - 07/05/20 1701      PT SHORT TERM GOAL #1   Title Pt will be independent with AROM for her elbow, wrist, and hand    Time 4    Period Weeks    Status Achieved    Target Date 07/06/20             PT Long Term Goals - 06/08/20 1329      PT LONG TERM GOAL #1   Title Pt will be independent with advanced HEP    Time 10    Period Weeks    Status New    Target Date 08/17/20      PT LONG TERM GOAL #2   Title Pt will improve FOTO score from 53% limitation to 36%    Baseline --    Time 10    Period Weeks    Status New    Target Date 08/17/20      PT LONG TERM GOAL #3   Title 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 Abduction against gravity remains limited -- pt still demonstrates upper trap compensation. Treatment focused on increased strengthening.    Personal Factors and Comorbidities Age;Comorbidity 1;Comorbidity 2    Comorbidities DM, OA  Examination-Activity Limitations Lift;Hygiene/Grooming;Reach Overhead;Caring for Others    Examination-Participation Restrictions Yard Work;Cleaning;Community Activity    Rehab Potential Good    PT Frequency 2x / week    PT Duration 8 weeks    PT Treatment/Interventions ADLs/Self Care Home Management;Cryotherapy;Electrical Stimulation;Iontophoresis 4mg /ml Dexamethasone;Moist Heat;Ultrasound;Functional mobility training;Therapeutic activities;Therapeutic exercise;Neuromuscular re-education;Patient/family education;Manual techniques;Scar mobilization;Passive range of motion;Dry needling;Taping;Vasopneumatic Device    PT Next Visit Plan Continue to progress AROM and strengthening. Consider vaso.    PT Home Exercise Plan [id number]    Consulted and Agree with Plan of Care Patient           Patient will benefit from skilled therapeutic intervention in order to improve the following deficits and impairments:  Decreased mobility, Hypomobility, Decreased scar mobility, Decreased coordination, Decreased strength, Increased fascial restricitons, Impaired flexibility, Impaired UE functional use, Postural dysfunction  Visit Diagnosis: Stiffness of right shoulder, not elsewhere classified  Chronic right shoulder pain  S/P right rotator cuff repair     Problem List Patient Active Problem List   Diagnosis Date Noted  . Multinodular goiter 09/16/2019  . Chronic pain disorder 08/09/2018  . Diabetic polyneuropathy associated with type 2 diabetes mellitus (HCC) 08/09/2018  . Gastroparesis 12/21/2017  . GI bleed 12/06/2017  . Hepatic steatosis 12/04/2017  . Chronic systolic CHF (congestive heart failure) (HCC) 09/16/2015  . 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 Low back pain 04/03/2014  . Unspecified vitamin D deficiency 01/20/2014  . Essential hypertension, benign 01/20/2014  . Chronic anticoagulation 10/31/2013  . Tachycardia induced  cardiomyopathy (HCC)   . H/O: hypothyroidism   . Obesity   . Type 2 diabetes mellitus with diabetic polyneuropathy, with long-term current use of insulin (HCC)   . Arthritis   . Dyslipidemia   . Atrial fibrillation  10/25/2013    Kelly Lang PT, DPT 07/12/2020, 4:47 PM  [facility] [address] [place], [place], [place] Phone: [phone]   Fax:  [phone]  Name: Kelly Lang MRN: [name] Date of Birth: August 23, 1961

## 2020-07-17 ENCOUNTER — Other Ambulatory Visit: Payer: Self-pay

## 2020-07-17 ENCOUNTER — Ambulatory Visit: Payer: 59 | Admitting: Physical Therapy

## 2020-07-17 DIAGNOSIS — M25611 Stiffness of right shoulder, not elsewhere classified: Secondary | ICD-10-CM

## 2020-07-17 DIAGNOSIS — Z9889 Other specified postprocedural states: Secondary | ICD-10-CM

## 2020-07-17 DIAGNOSIS — G8929 Other chronic pain: Secondary | ICD-10-CM

## 2020-07-17 NOTE — Therapy (Signed)
[facility] Outpatient Rehabilitation [facility] [address] [place], [place], [address] Phone: [phone]   Fax:  [phone]  Physical Therapy Treatment  Patient Details  Name: Kelly Lang MRN: [id number] Date of Birth: 20-Mar-1961 Referring Provider (PT): Julieanne Cotton, [place]   Encounter Date: 07/17/2020   PT End of Session - 07/17/20 1600    Visit Number 9    Number of Visits 20    PT Start Time 1605    PT Stop Time 1650    PT Time Calculation (min) 45 min    Activity Tolerance Patient tolerated treatment well    Behavior During Therapy [facility] for tasks assessed/performed           Past Medical History:  Diagnosis Date  . Arthritis    "all over" (06/30/2017)  . Chicken pox   . Dyslipidemia   . Dysrhythmia ablation 2018   a-fib  . GERD (gastroesophageal reflux disease)   . H/O: hypothyroidism    a. thyroid biopsy 1996 with synthroid. Stopped taking rx and was loss to follow up b. normal thyroid function 10/20/13  . High cholesterol   . Hx of cardiovascular stress test    ETT/Lexiscan Myoview (12/2013):  No ischemia; not gated; low risk.  Marland Kitchen Hypertension   . Obesity   . Persistent atrial fibrillation (HCC)    a diagnosed 10/25/2013  . Psoriasis    on Skyrizi  . Seasonal allergies   . Stroke [facility]) 2016   "some speech problems since" (06/30/2017)  . 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 2/2 Afib with RVR for unknown duration (EF: 40-45%)  . Thyroid disease   . Type II diabetes mellitus (HCC)    a. hg A1c 10, newly diagnosed (10/26/13)    Past Surgical History:  Procedure Laterality Date  . ATRIAL FIBRILLATION ABLATION  06/30/2017  . ATRIAL FIBRILLATION ABLATION N/A 06/30/2017   Procedure: Atrial Fibrillation Ablation;  Surgeon: Hillis Range, MD;  Location: [facility] INVASIVE CV LAB;  Service: Cardiovascular;  Laterality: N/A;  . BICEPT TENODESIS Right 05/24/2020   Procedure: BICEPS TENODESIS;  Surgeon: Cammy Copa, MD;  Location:  [facility];  Service: Orthopedics;  Laterality: Right;  . BIOPSY THYROID  1996  . CARDIOVERSION N/A 12/07/2013   Procedure: CARDIOVERSION;  Surgeon: Everette Rank, MD;  Location: [place] ENDOSCOPY;  Service: Cardiovascular;  Laterality: N/A;  . CARDIOVERSION N/A 03/10/2016   Procedure: CARDIOVERSION;  Surgeon: Vesta Mixer, MD;  Location: [facility] ENDOSCOPY;  Service: Cardiovascular;  Laterality: N/A;  . ESOPHAGOGASTRODUODENOSCOPY N/A 12/07/2017   Procedure: ESOPHAGOGASTRODUODENOSCOPY (EGD);  Surgeon: Hilarie Fredrickson, MD;  Location: [facility] ENDOSCOPY;  Service: Endoscopy;  Laterality: N/A;  . EYE MUSCLE SURGERY Right ~ 1966  . SHOULDER ARTHROSCOPY WITH ROTATOR CUFF REPAIR Right 05/24/2020   Procedure: right shoulder arthroscopy, rotator cuff tear repair biceps tenodesis;  Surgeon: Cammy Copa, MD;  Location: [facility];  Service: Orthopedics;  Laterality: Right;  . TEE WITHOUT CARDIOVERSION N/A 06/29/2017   Procedure: TRANSESOPHAGEAL ECHOCARDIOGRAM (TEE);  Surgeon: Pricilla Riffle, MD;  Location: [facility] ENDOSCOPY;  Service: Cardiovascular;  Laterality: N/A;  . TONSILLECTOMY  1971    There were no vitals filed for this visit.   Subjective Assessment - 07/17/20 1608    Subjective Pt reports she was a little sore after last session. No issues reported. Pt states she has continued to work on her AROM    Limitations Lifting    Currently in Pain? No/denies  [facility] Adult PT Treatment/Exercise - 07/17/20 0001      Shoulder Exercises: Supine   Diagonals Strengthening;Both;10 reps    Theraband Level (Shoulder Diagonals) Level 1 (Yellow)    Other Supine Exercises PNF 1 x 10 reps    Other Supine Exercises PNF 2 x 10 reps      Shoulder Exercises: Seated   Flexion Right;AROM;15 reps   last 5 reps with eccentric control at end range   Abduction AROM;Right;Strengthening;15 reps   last 5 reps with eccentric hold at end range     Shoulder  Exercises: Sidelying   External Rotation Strengthening;Right;Weights;20 reps    External Rotation Weight (lbs) 2    Flexion Strengthening;Right;20 reps    Flexion Weight (lbs) 2    ABduction Strengthening;Right;Weights;20 reps    ABduction Weight (lbs) 2      Shoulder Exercises: ROM/Strengthening   UBE (Upper Arm Bike) L1, 2.5 min forward, 2.5 min back     Wall Pushups 10 reps   counter pushups   Plank 30 seconds;2 reps      Vasopneumatic   Number Minutes Vasopneumatic  10 minutes    Vasopnuematic Location  Shoulder    Vasopneumatic Pressure Low    Vasopneumatic Temperature  34                    PT Short Term Goals - 07/05/20 1701      PT SHORT TERM GOAL #1   Title Pt will be independent with AROM for her elbow, wrist, and hand    Time 4    Period Weeks    Status Achieved    Target Date 07/06/20             PT Long Term Goals - 06/08/20 1329      PT LONG TERM GOAL #1   Title Pt will be independent with advanced HEP    Time 10    Period Weeks    Status New    Target Date 08/17/20      PT LONG TERM GOAL #2   Title Pt will improve FOTO score from 53% limitation to 36%    Baseline --    Time 10    Period Weeks    Status New    Target Date 08/17/20      PT LONG TERM GOAL #3   Title Pt will be 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 Treatment focused on progressing strengthening and muscle  endurance. Pt able to tolerate well.    Personal Factors and Comorbidities Age;Comorbidity  1;Comorbidity 2    Comorbidities DM, OA    Examination-Activity Limitations Lift;Hygiene/Grooming;Reach Overhead;Caring for Others    Examination-Participation Restrictions Yard Work;Cleaning;Community Activity    Rehab Potential Good    PT Frequency 2x / week    PT Duration 8 weeks    PT Treatment/Interventions ADLs/Self Care Home Management;Cryotherapy;Electrical Stimulation;Iontophoresis 4mg /ml Dexamethasone;Moist Heat;Ultrasound;Functional mobility training;Therapeutic activities;Therapeutic exercise;Neuromuscular re-education;Patient/family education;Manual techniques;Scar mobilization;Passive range of motion;Dry needling;Taping;Vasopneumatic Device    PT Next Visit Plan Continue to progress AROM and strengthening. Consider vaso.    PT Home Exercise Plan [id number]    Consulted and Agree with Plan of Care Patient           Patient will benefit from skilled therapeutic intervention in order to improve the following deficits and impairments:  Decreased mobility, Hypomobility, Decreased scar mobility, Decreased coordination, Decreased strength, Increased fascial restricitons, Impaired flexibility, Impaired UE functional use, Postural dysfunction  Visit Diagnosis: Stiffness of right shoulder, not elsewhere classified  Chronic right shoulder pain  S/P right rotator cuff repair     Problem List Patient Active Problem List   Diagnosis Date Noted  . Multinodular goiter 09/16/2019  . Chronic pain disorder 08/09/2018  . Diabetic polyneuropathy associated with type 2 diabetes mellitus (HCC) 08/09/2018  . Gastroparesis 12/21/2017  . GI bleed 12/06/2017  . Hepatic steatosis 12/04/2017  . Chronic systolic CHF (congestive heart failure) (HCC) 09/16/2015  . 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 Low back pain 04/03/2014  . Unspecified vitamin D  deficiency 01/20/2014  . Essential hypertension, benign 01/20/2014  . Chronic anticoagulation 10/31/2013  . Tachycardia induced cardiomyopathy (HCC)   . H/O: hypothyroidism   . Obesity   . Type 2 diabetes mellitus with diabetic polyneuropathy, with long-term current use of insulin (HCC)   . Arthritis   . Dyslipidemia   . Atrial fibrillation  10/25/2013    Antar Milks April Ma L Farhiya Rosten PT, DPT 07/17/2020, 4:46 PM  [facility] [address] [place], [place], [place] Phone: [phone]   Fax:  [phone]  Name: Kelly Lang MRN: [name] Date of Birth: 09/01/1961

## 2020-07-19 ENCOUNTER — Ambulatory Visit: Payer: 59 | Admitting: Physical Therapy

## 2020-07-19 ENCOUNTER — Ambulatory Visit: Payer: 59 | Admitting: Orthopedic Surgery

## 2020-07-24 ENCOUNTER — Other Ambulatory Visit: Payer: Self-pay

## 2020-07-24 ENCOUNTER — Ambulatory Visit: Payer: 59 | Admitting: Physical Therapy

## 2020-07-24 DIAGNOSIS — Z9889 Other specified postprocedural states: Secondary | ICD-10-CM

## 2020-07-24 DIAGNOSIS — M25511 Pain in right shoulder: Secondary | ICD-10-CM

## 2020-07-24 DIAGNOSIS — M25611 Stiffness of right shoulder, not elsewhere classified: Secondary | ICD-10-CM

## 2020-07-24 DIAGNOSIS — G8929 Other chronic pain: Secondary | ICD-10-CM

## 2020-07-24 NOTE — Therapy (Signed)
[facility] [address] [place], [place], [address] Phone: [phone]   Fax:  [phone]  Physical Therapy Treatment  Patient Details  Name: Kelly Lang MRN: [id number] Date of Birth: 1960-12-14 Referring Provider (PT): Julieanne Cotton, [place]   Encounter Date: 07/24/2020   PT End of Session - 07/24/20 1645    Visit Number 10    Number of Visits 20    PT Start Time 1620    PT Stop Time 1704    PT Time Calculation (min) 44 min    Activity Tolerance Patient tolerated treatment well    Behavior During Therapy [facility] for tasks assessed/performed           Past Medical History:  Diagnosis Date  . Arthritis    "all over" (06/30/2017)  . Chicken pox   . Dyslipidemia   . Dysrhythmia ablation 2018   a-fib  . GERD (gastroesophageal reflux disease)   . H/O: hypothyroidism    a. thyroid biopsy 1996 with synthroid. Stopped taking rx and was loss to follow up b. normal thyroid function 10/20/13  . High cholesterol   . Hx of cardiovascular stress test    ETT/Lexiscan Myoview (12/2013):  No ischemia; not gated; low risk.  Marland Kitchen Hypertension   . Obesity   . Persistent atrial fibrillation (HCC)    a diagnosed 10/25/2013  . Psoriasis    on Skyrizi  . Seasonal allergies   . Stroke [facility]) 2016   "some speech problems since" (06/30/2017)  . 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 2/2 Afib with RVR for unknown duration (EF: 40-45%)  . Thyroid disease   . Type II diabetes mellitus (HCC)    a. hg A1c 10, newly diagnosed (10/26/13)    Past Surgical History:  Procedure Laterality Date  . ATRIAL FIBRILLATION ABLATION  06/30/2017  . ATRIAL FIBRILLATION ABLATION N/A 06/30/2017   Procedure: Atrial Fibrillation Ablation;  Surgeon: Hillis Range, MD;  Location: [facility] INVASIVE CV LAB;  Service: Cardiovascular;  Laterality: N/A;  . BICEPT TENODESIS Right 05/24/2020   Procedure: BICEPS TENODESIS;  Surgeon: Cammy Copa, MD;  Location:  [facility];  Service: Orthopedics;  Laterality: Right;  . BIOPSY THYROID  1996  . CARDIOVERSION N/A 12/07/2013   Procedure: CARDIOVERSION;  Surgeon: Everette Rank, MD;  Location: [facility] ENDOSCOPY;  Service: Cardiovascular;  Laterality: N/A;  . CARDIOVERSION N/A 03/10/2016   Procedure: CARDIOVERSION;  Surgeon: Vesta Mixer, MD;  Location: [facility] ENDOSCOPY;  Service: Cardiovascular;  Laterality: N/A;  . ESOPHAGOGASTRODUODENOSCOPY N/A 12/07/2017   Procedure: ESOPHAGOGASTRODUODENOSCOPY (EGD);  Surgeon: Hilarie Fredrickson, MD;  Location: [facility] ENDOSCOPY;  Service: Endoscopy;  Laterality: N/A;  . EYE MUSCLE SURGERY Right ~ 1966  . SHOULDER ARTHROSCOPY WITH ROTATOR CUFF REPAIR Right 05/24/2020   Procedure: right shoulder arthroscopy, rotator cuff tear repair biceps tenodesis;  Surgeon: Cammy Copa, MD;  Location: [facility];  Service: Orthopedics;  Laterality: Right;  . TEE WITHOUT CARDIOVERSION N/A 06/29/2017   Procedure: TRANSESOPHAGEAL ECHOCARDIOGRAM (TEE);  Surgeon: Pricilla Riffle, MD;  Location: [facility] ENDOSCOPY;  Service: Cardiovascular;  Laterality: N/A;  . TONSILLECTOMY  1971    There were no vitals filed for this visit.   Subjective Assessment - 07/24/20 1626    Subjective Pt states she's been icing in the evenings. Pt reports some soreness with exercises.    Limitations Lifting    Currently in Pain? No/denies  [facility] Adult PT Treatment/Exercise - 07/24/20 0001      Shoulder Exercises: Seated   External Rotation AROM;Strengthening;Right;20 reps;Theraband    Theraband Level (Shoulder External Rotation) Level 2 (Red)    Flexion Right;AROM;Strengthening;20 reps   10 reps no weight, 10 reps with 1 lb   Abduction AROM;Right;Strengthening;10 reps   10 reps no weight, 10 reps with AAROM for end range      Shoulder Exercises: Standing   External Rotation Strengthening;Right;20 reps;Theraband    Theraband Level (Shoulder External  Rotation) Level 2 (Red)    Internal Rotation Strengthening;Right;20 reps;Theraband    Theraband Level (Shoulder Internal Rotation) Level 2 (Red)    ABduction AAROM;Right;10 reps   for end range abduction   Extension Strengthening;Both;20 reps;Theraband    Theraband Level (Shoulder Extension) Level 2 (Red)    Row Strengthening;Both;Theraband;20 reps    Theraband Level (Shoulder Row) Level 2 (Red)    Other Standing Exercises bicep curl 2x10 with 1 lb, brachialis curl 2x10 with 1 lb      Shoulder Exercises: ROM/Strengthening   UBE (Upper Arm Bike) L2.5, 2.5 min forward, 2.5 min backward                    PT Short Term Goals - 07/05/20 1701      PT SHORT TERM GOAL #1   Title Pt will be independent with AROM for her elbow, wrist, and hand    Time 4    Period Weeks    Status Achieved    Target Date 07/06/20             PT Long Term Goals - 06/08/20 1329      PT LONG TERM GOAL #1   Title Pt will be independent with advanced HEP    Time 10    Period Weeks    Status New    Target Date 08/17/20      PT LONG TERM GOAL #2   Title Pt will improve FOTO score from 53% limitation to 36%    Baseline --    Time 10    Period Weeks    Status New    Target Date 08/17/20      PT LONG TERM GOAL #3   Title 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 Continued  progression of scapular and RTC strengthening against gravity. Initiated biceps strengthening. Pt able to tolerate well.    Personal Factors and Comorbidities Age;Comorbidity 1;Comorbidity 2    Comorbidities DM, OA  Examination-Activity Limitations Lift;Hygiene/Grooming;Reach Overhead;Caring for Others    Examination-Participation Restrictions Yard Work;Cleaning;Community Activity    Rehab Potential Good    PT Frequency 2x / week    PT Duration 8 weeks    PT Treatment/Interventions ADLs/Self Care Home Management;Cryotherapy;Electrical Stimulation;Iontophoresis 4mg /ml Dexamethasone;Moist Heat;Ultrasound;Functional mobility training;Therapeutic activities;Therapeutic exercise;Neuromuscular re-education;Patient/family education;Manual techniques;Scar mobilization;Passive range of motion;Dry needling;Taping;Vasopneumatic Device    PT Next Visit Plan Continue to progress AROM and strengthening. Consider vaso.    PT Home Exercise Plan . RTC strengthening in all directions. Bicep curl.    Consulted and Agree with Plan of Care Patient           Patient will benefit from skilled therapeutic intervention in order to improve the following deficits and impairments:  Decreased mobility, Hypomobility, Decreased scar mobility, Decreased coordination, Decreased strength, Increased fascial restricitons, Impaired flexibility, Impaired UE functional use, Postural dysfunction  Visit Diagnosis: Stiffness of right shoulder, not elsewhere classified  Chronic right shoulder pain  S/P right rotator cuff repair     Problem List Patient Active Problem List   Diagnosis Date Noted  . Multinodular goiter 09/16/2019  . Chronic pain disorder 08/09/2018  . Diabetic polyneuropathy associated with type 2 diabetes mellitus (HCC) 08/09/2018  . Gastroparesis 12/21/2017  . GI bleed 12/06/2017  . Hepatic steatosis 12/04/2017  . Chronic systolic CHF (congestive heart failure) (HCC) 09/16/2015  .  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 Low back pain 04/03/2014  . Unspecified vitamin D deficiency 01/20/2014  . Essential hypertension, benign 01/20/2014  . Chronic anticoagulation 10/31/2013  . Tachycardia induced cardiomyopathy (HCC)   . H/O: hypothyroidism   . Obesity   . Type 2 diabetes mellitus with diabetic polyneuropathy, with long-term current use of insulin (HCC)   . Arthritis   . Dyslipidemia   . Atrial fibrillation  10/25/2013    [facility] [address] [place] PT, DPT 07/24/2020, 5:08 PM  [facility] [address] [place], [place], [place] Phone: [phone]   Fax:  [phone]  Name: Delanna Blacketer MRN: [name] Date of Birth: [birth year]

## 2020-07-26 ENCOUNTER — Encounter: Payer: Self-pay | Admitting: Physical Therapy

## 2020-07-26 ENCOUNTER — Ambulatory Visit: Payer: 59 | Admitting: Physical Therapy

## 2020-07-26 ENCOUNTER — Other Ambulatory Visit: Payer: Self-pay

## 2020-07-26 DIAGNOSIS — M25611 Stiffness of right shoulder, not elsewhere classified: Secondary | ICD-10-CM

## 2020-07-26 DIAGNOSIS — G8929 Other chronic pain: Secondary | ICD-10-CM

## 2020-07-26 DIAGNOSIS — Z9889 Other specified postprocedural states: Secondary | ICD-10-CM

## 2020-07-26 NOTE — Therapy (Signed)
[facility] [address] [place], [place], [address] Phone: [phone]   Fax:  [phone]  Physical Therapy Treatment  Patient Details  Name: Kelly Lang MRN: [id number] Date of Birth: 07/19/1961 Referring Provider (PT): Julieanne Cotton, [place]   Encounter Date: 07/26/2020   PT End of Session - 07/26/20 1617    Visit Number 11    Number of Visits 20    Date for PT Re-Evaluation 08/17/20    PT Start Time 1617    PT Stop Time 1700    PT Time Calculation (min) 43 min    Activity Tolerance Patient tolerated treatment well    Behavior During Therapy [facility] for tasks assessed/performed           Past Medical History:  Diagnosis Date  . Arthritis    "all over" (06/30/2017)  . Chicken pox   . Dyslipidemia   . Dysrhythmia ablation 2018   a-fib  . GERD (gastroesophageal reflux disease)   . H/O: hypothyroidism    a. thyroid biopsy 1996 with synthroid. Stopped taking rx and was loss to follow up b. normal thyroid function 10/20/13  . High cholesterol   . Hx of cardiovascular stress test    ETT/Lexiscan Myoview (12/2013):  No ischemia; not gated; low risk.  Marland Kitchen Hypertension   . Obesity   . Persistent atrial fibrillation (HCC)    a diagnosed 10/25/2013  . Psoriasis    on Skyrizi  . Seasonal allergies   . Stroke [facility]) 2016   "some speech problems since" (06/30/2017)  . 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 2/2 Afib with RVR for unknown duration (EF: 40-45%)  . Thyroid disease   . Type II diabetes mellitus (HCC)    a. hg A1c 10, newly diagnosed (10/26/13)    Past Surgical History:  Procedure Laterality Date  . ATRIAL FIBRILLATION ABLATION  06/30/2017  . ATRIAL FIBRILLATION ABLATION N/A 06/30/2017   Procedure: Atrial Fibrillation Ablation;  Surgeon: Hillis Range, MD;  Location: [facility] INVASIVE CV LAB;  Service: Cardiovascular;  Laterality: N/A;  . BICEPT TENODESIS Right 05/24/2020   Procedure: BICEPS TENODESIS;   Surgeon: Cammy Copa, MD;  Location: [facility];  Service: Orthopedics;  Laterality: Right;  . BIOPSY THYROID  1996  . CARDIOVERSION N/A 12/07/2013   Procedure: CARDIOVERSION;  Surgeon: Everette Rank, MD;  Location: [facility] ENDOSCOPY;  Service: Cardiovascular;  Laterality: N/A;  . CARDIOVERSION N/A 03/10/2016   Procedure: CARDIOVERSION;  Surgeon: Vesta Mixer, MD;  Location: [facility] ENDOSCOPY;  Service: Cardiovascular;  Laterality: N/A;  . ESOPHAGOGASTRODUODENOSCOPY N/A 12/07/2017   Procedure: ESOPHAGOGASTRODUODENOSCOPY (EGD);  Surgeon: Hilarie Fredrickson, MD;  Location: [facility] ENDOSCOPY;  Service: Endoscopy;  Laterality: N/A;  . EYE MUSCLE SURGERY Right ~ 1966  . SHOULDER ARTHROSCOPY WITH ROTATOR CUFF REPAIR Right 05/24/2020   Procedure: right shoulder arthroscopy, rotator cuff tear repair biceps tenodesis;  Surgeon: Cammy Copa, MD;  Location: [facility];  Service: Orthopedics;  Laterality: Right;  . TEE WITHOUT CARDIOVERSION N/A 06/29/2017   Procedure: TRANSESOPHAGEAL ECHOCARDIOGRAM (TEE);  Surgeon: Pricilla Riffle, MD;  Location: [facility] ENDOSCOPY;  Service: Cardiovascular;  Laterality: N/A;  . TONSILLECTOMY  1971    There were no vitals filed for this visit.                      [facility] Adult PT Treatment/Exercise - 07/26/20 0001      Shoulder Exercises: Supine   Protraction Strengthening;Right;20 reps;Weights  Protraction Weight (lbs) 2    External Rotation Strengthening;Right    External Rotation Weight (lbs) 1    Internal Rotation Strengthening;Right    Internal Rotation Weight (lbs) 1    Other Supine Exercises tricep curl 2x10 with 1#      Shoulder Exercises: Standing   Other Standing Exercises Cone stacking first & second shelf x 1 min each; 2# weight to first & second shelf x10 each    Other Standing Exercises bicep curl 2x10, brachialis curl 2x10      Shoulder Exercises: ROM/Strengthening   UBE (Upper Arm Bike) L2.5, 2.5 min forward, 2.5  min backward      Shoulder Exercises: Stretch   Other Shoulder Stretches Sleeper stretch 2x30 sec      Vasopneumatic   Number Minutes Vasopneumatic  10 minutes    Vasopnuematic Location  Shoulder    Vasopneumatic Pressure Low    Vasopneumatic Temperature  34      Manual Therapy   Passive ROM right shoulder abduction                    PT Short Term Goals - 07/05/20 1701      PT SHORT TERM GOAL #1   Title Pt will be independent with AROM for her elbow, wrist, and hand    Time 4    Period Weeks    Status Achieved    Target Date 07/06/20             PT Long Term Goals - 06/08/20 1329      PT LONG TERM GOAL #1   Title Pt will be independent with advanced HEP    Time 10    Period Weeks    Status New    Target Date 08/17/20      PT LONG TERM GOAL #2   Title Pt will improve FOTO score from 53% limitation to 36%    Baseline --    Time 10    Period Weeks    Status New    Target Date 08/17/20      PT LONG TERM GOAL #3   Title Pt will be able 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 Pt with increased soreness from strengthening exercises. Pt's abduction is about 5 to 10 degrees from matching her L shoulder abduction range. Continued to progress pt's strengthening exercises for RTC, biceps, and triceps. Will benefit from continued therapy to continue to strengthen shoulder girdle and RTC to reach pt's goals for  lifting. Pt performing HEP at home; pt would be good  to decrease visits to 1x/wk.    Personal Factors and Comorbidities Age;Comorbidity 1;Comorbidity 2    Comorbidities DM, OA    Examination-Activity Limitations Lift;Hygiene/Grooming;Reach Overhead;Caring for Others    Examination-Participation Restrictions Yard Work;Cleaning;Community Activity    Rehab Potential Good    PT Frequency 2x / week    PT Duration 8 weeks    PT Treatment/Interventions ADLs/Self Care Home Management;Cryotherapy;Electrical Stimulation;Iontophoresis 4mg /ml Dexamethasone;Moist Heat;Ultrasound;Functional mobility training;Therapeutic activities;Therapeutic exercise;Neuromuscular re-education;Patient/family education;Manual techniques;Scar mobilization;Passive range of motion;Dry needling;Taping;Vasopneumatic Device    PT Next Visit Plan Continue to progress AROM and strengthening. Consider vaso.    PT Home Exercise Plan [email or web address]    Consulted and Agree with Plan of Care Patient           Patient will benefit from skilled therapeutic intervention in order to improve the following deficits and impairments:  Decreased mobility, Hypomobility, Decreased scar mobility, Decreased coordination, Decreased strength, Increased fascial restricitons, Impaired flexibility, Impaired UE functional use, Postural dysfunction  Visit Diagnosis: Stiffness of right shoulder, not elsewhere classified  Chronic right shoulder pain  S/P right rotator cuff repair     Problem List Patient Active Problem List   Diagnosis Date Noted  . Multinodular goiter 09/16/2019  . Chronic pain disorder 08/09/2018  . Diabetic polyneuropathy associated with type 2 diabetes mellitus (HCC) 08/09/2018  . Gastroparesis 12/21/2017  . GI bleed 12/06/2017  . Hepatic steatosis 12/04/2017  . Chronic systolic CHF (congestive heart failure) (HCC) 09/16/2015  . 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 Low back  pain 04/03/2014  . Unspecified vitamin D deficiency 01/20/2014  . Essential hypertension, benign 01/20/2014  . Chronic anticoagulation 10/31/2013  . Tachycardia induced cardiomyopathy (HCC)   . H/O: hypothyroidism   . Obesity   . Type 2 diabetes mellitus with diabetic polyneuropathy, with long-term current use of insulin (HCC)   . Arthritis   . Dyslipidemia   . Atrial fibrillation  10/25/2013    [facility] [address] [place] PT, DPT 07/26/2020, 5:08 PM  [facility] [address] [place], [place], [place] Phone: [phone]   Fax:  [phone]  Name: Kelly Lang MRN: [name] Date of Birth: 1960/12/26

## 2020-07-30 ENCOUNTER — Other Ambulatory Visit: Payer: Self-pay | Admitting: Cardiovascular Disease

## 2020-07-30 ENCOUNTER — Other Ambulatory Visit: Payer: Self-pay | Admitting: Internal Medicine

## 2020-07-30 DIAGNOSIS — M2550 Pain in unspecified joint: Secondary | ICD-10-CM

## 2020-07-30 MED FILL — LISINOPRIL 10 MG TABS: 10 | 30 days supply | Qty: 15 | Fill #1

## 2020-07-30 MED FILL — XARELTO 20 MG TABLET: 20 | 30 days supply | Qty: 30 | Fill #8

## 2020-07-30 MED FILL — LANTUS SOLOSTAR 100 UNITS/M: 100 | 22 days supply | Qty: 30 | Fill #4

## 2020-07-30 MED FILL — ATORVASTATIN CALCIUM 40 MG: 40 | 30 days supply | Qty: 30 | Fill #1

## 2020-07-30 MED FILL — glipiZIDE 5 MG TABS: 5 | 30 days supply | Qty: 15 | Fill #3

## 2020-07-30 MED FILL — OZEMPIC (1 MG/DOSE) 4 MG/3M: 4 | 28 days supply | Qty: 3 | Fill #2

## 2020-07-30 MED FILL — DULoxetine HCL 20 MG CPEP: 20 | 30 days supply | Qty: 30 | Fill #0

## 2020-07-31 ENCOUNTER — Other Ambulatory Visit: Payer: Self-pay

## 2020-07-31 ENCOUNTER — Ambulatory Visit: Payer: 59 | Admitting: Physical Therapy

## 2020-07-31 ENCOUNTER — Encounter: Payer: Self-pay | Admitting: Physical Therapy

## 2020-07-31 DIAGNOSIS — G8929 Other chronic pain: Secondary | ICD-10-CM

## 2020-07-31 DIAGNOSIS — M25611 Stiffness of right shoulder, not elsewhere classified: Secondary | ICD-10-CM

## 2020-07-31 DIAGNOSIS — Z9889 Other specified postprocedural states: Secondary | ICD-10-CM

## 2020-07-31 NOTE — Therapy (Signed)
[facility]patient Rehabilitation [facility] [address] [place], [place], [address] Phone: [phone]   Fax:  [phone]  Physical Therapy Treatment  Patient Details  Name: Kelly Lang MRN: [id number] Date of Birth: 02-26-1961 Referring Provider (PT): Julieanne Cotton, [place]   Encounter Date: 07/31/2020   PT End of Session - 07/31/20 1711    Visit Number 12    Number of Visits 20    Date for PT Re-Evaluation 08/17/20    PT Start Time 1615    PT Stop Time 1700    PT Time Calculation (min) 45 min    Activity Tolerance Patient tolerated treatment well    Behavior During Therapy [facility] for tasks assessed/performed           Past Medical History:  Diagnosis Date  . Arthritis    "all over" (06/30/2017)  . Chicken pox   . Dyslipidemia   . Dysrhythmia ablation 2018   a-fib  . GERD (gastroesophageal reflux disease)   . H/O: hypothyroidism    a. thyroid biopsy 1996 with synthroid. Stopped taking rx and was loss to follow up b. normal thyroid function 10/20/13  . High cholesterol   . Hx of cardiovascular stress test    ETT/Lexiscan Myoview (12/2013):  No ischemia; not gated; low risk.  Marland Kitchen Hypertension   . Obesity   . Persistent atrial fibrillation (HCC)    a diagnosed 10/25/2013  . Psoriasis    on Skyrizi  . Seasonal allergies   . Stroke [facility]) 2016   "some speech problems since" (06/30/2017)  . 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 2/2 Afib with RVR for unknown duration (EF: 40-45%)  . Thyroid disease   . Type II diabetes mellitus (HCC)    a. hg A1c 10, newly diagnosed (10/26/13)    Past Surgical History:  Procedure Laterality Date  . ATRIAL FIBRILLATION ABLATION  06/30/2017  . ATRIAL FIBRILLATION ABLATION N/A 06/30/2017   Procedure: Atrial Fibrillation Ablation;  Surgeon: Hillis Range, MD;  Location: [facility] INVASIVE CV LAB;  Service: Cardiovascular;  Laterality: N/A;  . BICEPT TENODESIS Right 05/24/2020   Procedure: BICEPS TENODESIS;   Surgeon: Cammy Copa, MD;  Location: [facility];  Service: Orthopedics;  Laterality: Right;  . BIOPSY THYROID  1996  . CARDIOVERSION N/A 12/07/2013   Procedure: CARDIOVERSION;  Surgeon: Everette Rank, MD;  Location: [facility] ENDOSCOPY;  Service: Cardiovascular;  Laterality: N/A;  . CARDIOVERSION N/A 03/10/2016   Procedure: CARDIOVERSION;  Surgeon: Vesta Mixer, MD;  Location: [facility] ENDOSCOPY;  Service: Cardiovascular;  Laterality: N/A;  . ESOPHAGOGASTRODUODENOSCOPY N/A 12/07/2017   Procedure: ESOPHAGOGASTRODUODENOSCOPY (EGD);  Surgeon: Hilarie Fredrickson, MD;  Location: [facility] ENDOSCOPY;  Service: Endoscopy;  Laterality: N/A;  . EYE MUSCLE SURGERY Right ~ 1966  . SHOULDER ARTHROSCOPY WITH ROTATOR CUFF REPAIR Right 05/24/2020   Procedure: right shoulder arthroscopy, rotator cuff tear repair biceps tenodesis;  Surgeon: Cammy Copa, MD;  Location: [facility];  Service: Orthopedics;  Laterality: Right;  . TEE WITHOUT CARDIOVERSION N/A 06/29/2017   Procedure: TRANSESOPHAGEAL ECHOCARDIOGRAM (TEE);  Surgeon: Pricilla Riffle, MD;  Location: [facility] ENDOSCOPY;  Service: Cardiovascular;  Laterality: N/A;  . TONSILLECTOMY  1971    There were no vitals filed for this visit.   Subjective Assessment - 07/31/20 1620    Subjective Pt reports that she's still using the cans for strengthening but feels they are getting easier. Pt states she is also using her right arm more to lift things up  and down the cabinet.    Limitations Lifting    Currently in Pain? No/denies                             [facility] Adult PT Treatment/Exercise - 07/31/20 0001      Shoulder Exercises: Seated   Flexion Strengthening;Right;10 reps;Weights    Flexion Weight (lbs) 3    Abduction Strengthening;Right;10 reps;Weights    ABduction Weight (lbs) 2    Other Seated Exercises Scaption 2lbs x10    Other Seated Exercises bicep curl 2x10 with 2lbs      Shoulder Exercises: Standing   External  Rotation Strengthening;Right;20 reps;Theraband    Theraband Level (Shoulder External Rotation) Level 3 (Green)    Extension Strengthening;Both;20 reps;Theraband    Theraband Level (Shoulder Extension) Level 4 (Blue)    Row Strengthening;Both;Theraband;20 reps    Theraband Level (Shoulder Row) Level 4 (Blue)    Other Standing Exercises High rows 2x10 blue tband    Other Standing Exercises tricep curl 2x10 red tband      Shoulder Exercises: ROM/Strengthening   UBE (Upper Arm Bike) L2.5, 2.5 min forward, 2.5 min backward      Shoulder Exercises: Stretch   Other Shoulder Stretches Shoulder IR 2x20 sec                    PT Short Term Goals - 07/05/20 1701      PT SHORT TERM GOAL #1   Title Pt will be independent with AROM for her elbow, wrist, and hand    Time 4    Period Weeks    Status Achieved    Target Date 07/06/20             PT Long Term Goals - 06/08/20 1329      PT LONG TERM GOAL #1   Title Pt will be independent with advanced HEP    Time 10    Period Weeks    Status New    Target Date 08/17/20      PT LONG TERM GOAL #2   Title Pt will improve FOTO score from 53% limitation to 36%    Baseline --    Time 10    Period Weeks    Status New    Target Date 08/17/20      PT LONG TERM GOAL #3   Title 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 Pt up to 3 lbs with flexion and not yet able to  tolerate  3 lbs with abduction. Bicep, tricep and scapular strengthening continues. Pt tolerated treatment well. In terms of ROM, pt mostly limited in IR.    Personal Factors and Comorbidities Age;Comorbidity 1;Comorbidity 2    Comorbidities DM, OA    Examination-Activity Limitations Lift;Hygiene/Grooming;Reach Overhead;Caring for Others    Examination-Participation Restrictions Yard Work;Cleaning;Community Activity    Rehab Potential Good    PT Frequency 2x / week    PT Duration 8 weeks    PT Treatment/Interventions ADLs/Self Care Home Management;Cryotherapy;Electrical Stimulation;Iontophoresis 4mg /ml Dexamethasone;Moist Heat;Ultrasound;Functional mobility training;Therapeutic activities;Therapeutic exercise;Neuromuscular re-education;Patient/family education;Manual techniques;Scar mobilization;Passive range of motion;Dry needling;Taping;Vasopneumatic Device    PT Next Visit Plan Continue to progress strengthening. Vaso as needed. Shoulder IR stretching.    PT Home Exercise Plan [id number]: bicep, tricep, shoulder press up, shoulder flexion & abduction    Consulted and Agree with Plan of Care Patient           Patient will benefit from skilled therapeutic intervention in order to improve the following deficits and impairments:  Decreased mobility, Hypomobility, Decreased scar mobility, Decreased coordination, Decreased strength, Increased fascial restricitons, Impaired flexibility, Impaired UE functional use, Postural dysfunction  Visit Diagnosis: Stiffness of right shoulder, not elsewhere classified  Chronic right shoulder pain  S/P right rotator cuff repair     Problem List Patient Active Problem List   Diagnosis Date Noted  . Multinodular goiter 09/16/2019  . Chronic pain disorder 08/09/2018  . Diabetic polyneuropathy associated with type 2 diabetes mellitus (HCC) 08/09/2018  . Gastroparesis 12/21/2017  . GI bleed 12/06/2017  . Hepatic steatosis 12/04/2017  . Chronic systolic  CHF (congestive heart failure) (HCC) 09/16/2015  . 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 Low back pain 04/03/2014  . Unspecified vitamin D deficiency 01/20/2014  . Essential hypertension, benign 01/20/2014  . Chronic anticoagulation 10/31/2013  . Tachycardia induced cardiomyopathy (HCC)   . H/O: hypothyroidism   . Obesity   . Type 2 diabetes mellitus with diabetic polyneuropathy, with long-term current use of insulin (HCC)   . Arthritis   . Dyslipidemia   . Atrial fibrillation  10/25/2013    Dois Juarbe April Ma L [place] PT, DPT 07/31/2020, 5:14 PM  [facility] [address] [place], [place], [place] Phone: [phone]   Fax:  [phone]  Name: Alexandera Kuntzman MRN: [name] Date of Birth: 03-26-1961

## 2020-08-01 ENCOUNTER — Other Ambulatory Visit: Payer: Self-pay | Admitting: Cardiovascular Disease

## 2020-08-01 MED FILL — METOPROLOL TARTRATE 25 MG T: 25 | 90 days supply | Qty: 270 | Fill #0

## 2020-08-02 ENCOUNTER — Ambulatory Visit: Payer: 59 | Admitting: Physical Therapy

## 2020-08-03 ENCOUNTER — Ambulatory Visit (INDEPENDENT_AMBULATORY_CARE_PROVIDER_SITE_OTHER): Payer: 59 | Admitting: Orthopedic Surgery

## 2020-08-03 ENCOUNTER — Encounter: Payer: Self-pay | Admitting: Orthopedic Surgery

## 2020-08-03 VITALS — Ht 65.0 in | Wt 249.0 lb

## 2020-08-03 DIAGNOSIS — M75111 Incomplete rotator cuff tear or rupture of right shoulder, not specified as traumatic: Secondary | ICD-10-CM

## 2020-08-03 NOTE — Progress Notes (Signed)
Post-Op Visit Note   Patient: Kelly Lang           Date of Birth: [birth year]           MRN: [id number] Visit Date: 08/03/2020 PCP: Marcine Matar, MD   Assessment & Plan:  Chief Complaint:  Chief Complaint  Patient presents with  . Right Shoulder - Follow-up    05/24/2020 Right shoulder scopt with RCR, BT   Visit Diagnoses:  1. Nontraumatic incomplete tear of right rotator cuff     Plan: Danese is a patient is 6 weeks out rotator cuff tear repair.  Patient's been doing well.  On exam she has excellent range of motion and strength.  No coarse grinding with passive range of motion.  Plan is continue with therapy 1 times a week and home exercise program where she is using soup cans and bands.  6-week return with Franky Macho for final clinical recheck and likely release at that time.  She will need to follow-up on Friday afternoon in 6 weeks  Follow-Up Instructions: Return in about 6 weeks (around 09/14/2020).   Orders:  No orders of the defined types were placed in this encounter.  No orders of the defined types were placed in this encounter.   Imaging: No results found.  PMFS History: Patient Active Problem List   Diagnosis Date Noted  . Multinodular goiter 09/16/2019  . Chronic pain disorder 08/09/2018  . Diabetic polyneuropathy associated with type 2 diabetes mellitus (HCC) 08/09/2018  . Gastroparesis 12/21/2017  . GI bleed 12/06/2017  . Hepatic steatosis 12/04/2017  . Chronic systolic CHF (congestive heart failure) (HCC) 09/16/2015  . 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 Low back pain 04/03/2014  . Unspecified vitamin D deficiency 01/20/2014  . Essential hypertension, benign 01/20/2014  . Chronic anticoagulation 10/31/2013  . Tachycardia induced cardiomyopathy (HCC)   . H/O: hypothyroidism   . Obesity   . Type 2 diabetes mellitus with diabetic polyneuropathy, with long-term current use of insulin (HCC)   .  Arthritis   . Dyslipidemia   . Atrial fibrillation  10/25/2013   Past Medical History:  Diagnosis Date  . Arthritis    "all over" (06/30/2017)  . Chicken pox   . Dyslipidemia   . Dysrhythmia ablation 2018   a-fib  . GERD (gastroesophageal reflux disease)   . H/O: hypothyroidism    a. thyroid biopsy 1996 with synthroid. Stopped taking rx and was loss to follow up b. normal thyroid function 10/20/13  . High cholesterol   . Hx of cardiovascular stress test    ETT/Lexiscan Myoview (12/2013):  No ischemia; not gated; low risk.  Marland Kitchen Hypertension   . Obesity   . Persistent atrial fibrillation (HCC)    a diagnosed 10/25/2013  . Psoriasis    on Skyrizi  . Seasonal allergies   . Stroke [facility]) 2016   "some speech problems since" (06/30/2017)  . 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 2/2 Afib with RVR for unknown duration (EF: 40-45%)  . Thyroid disease   . Type II diabetes mellitus (HCC)    a. hg A1c 10, newly diagnosed (10/26/13)    Family History  Adopted: Yes  Problem Relation Age of Onset  . Hypertension Mother   . Hyperlipidemia Mother     Past Surgical History:  Procedure Laterality Date  . ATRIAL FIBRILLATION ABLATION  06/30/2017  . ATRIAL FIBRILLATION ABLATION N/A 06/30/2017   Procedure: Atrial Fibrillation Ablation;  Surgeon: Hillis Range, MD;  Location: [facility] INVASIVE CV LAB;  Service: Cardiovascular;  Laterality: N/A;  . BICEPT TENODESIS Right 05/24/2020   Procedure: BICEPS TENODESIS;  Surgeon: Cammy Copa, MD;  Location: [facility];  Service: Orthopedics;  Laterality: Right;  . BIOPSY THYROID  1996  . CARDIOVERSION N/A 12/07/2013   Procedure: CARDIOVERSION;  Surgeon: Everette Rank, MD;  Location: [facility] ENDOSCOPY;  Service: Cardiovascular;  Laterality: N/A;  . CARDIOVERSION N/A 03/10/2016   Procedure: CARDIOVERSION;  Surgeon: Vesta Mixer, MD;  Location: [facility] ENDOSCOPY;  Service: Cardiovascular;  Laterality: N/A;  . ESOPHAGOGASTRODUODENOSCOPY N/A  12/07/2017   Procedure: ESOPHAGOGASTRODUODENOSCOPY (EGD);  Surgeon: Hilarie Fredrickson, MD;  Location: [facility] ENDOSCOPY;  Service: Endoscopy;  Laterality: N/A;  . EYE MUSCLE SURGERY Right ~ 1966  . SHOULDER ARTHROSCOPY WITH ROTATOR CUFF REPAIR Right 05/24/2020   Procedure: right shoulder arthroscopy, rotator cuff tear repair biceps tenodesis;  Surgeon: Cammy Copa, MD;  Location: [facility];  Service: Orthopedics;  Laterality: Right;  . TEE WITHOUT CARDIOVERSION N/A 06/29/2017   Procedure: TRANSESOPHAGEAL ECHOCARDIOGRAM (TEE);  Surgeon: Pricilla Riffle, MD;  Location: [facility] ENDOSCOPY;  Service: Cardiovascular;  Laterality: N/A;  . TONSILLECTOMY  1971   Social History   Occupational History  . Occupation: Designer, television/film set  Tobacco Use  . Smoking status: Former Smoker    Packs/day: 1.00    Years: 22.00    Pack years: 22.00    Types: Cigarettes    Quit date: 12/01/2010    Years since quitting: 9.6  . Smokeless tobacco: Never Used  Vaping Use  . Vaping Use: Never used  Substance and Sexual Activity  . Alcohol use: Yes    Comment: social  . Drug use: No  . Sexual activity: Not Currently

## 2020-08-13 ENCOUNTER — Ambulatory Visit: Payer: 59 | Attending: Surgical | Admitting: Physical Therapy

## 2020-08-13 ENCOUNTER — Encounter: Payer: Self-pay | Admitting: Physical Therapy

## 2020-08-13 ENCOUNTER — Other Ambulatory Visit: Payer: Self-pay

## 2020-08-13 DIAGNOSIS — Z9889 Other specified postprocedural states: Secondary | ICD-10-CM | POA: Insufficient documentation

## 2020-08-13 DIAGNOSIS — M25611 Stiffness of right shoulder, not elsewhere classified: Secondary | ICD-10-CM | POA: Diagnosis not present

## 2020-08-13 DIAGNOSIS — M25511 Pain in right shoulder: Secondary | ICD-10-CM | POA: Insufficient documentation

## 2020-08-13 DIAGNOSIS — G8929 Other chronic pain: Secondary | ICD-10-CM | POA: Insufficient documentation

## 2020-08-13 NOTE — Therapy (Signed)
[place], [place], [address] Phone: [phone]   Fax:  [phone]  Physical Therapy Treatment and Discharge  Patient Details  Name: Kelly Lang MRN: [id number] Date of Birth: 12/02/1960 Referring Provider (PT): Donella Stade, [place]   Encounter Date: 08/13/2020   PT End of Session - 08/13/20 1628    Visit Number 13    Number of Visits 20    Date for PT Re-Evaluation 08/17/20    PT Start Time 1620    PT Stop Time 1705    PT Time Calculation (min) 45 min    Activity Tolerance Patient tolerated treatment well    Behavior During Therapy [facility] for tasks assessed/performed           Past Medical History:  Diagnosis Date  . Arthritis    "all over" (06/30/2017)  . Chicken pox   . Dyslipidemia   . Dysrhythmia ablation 2018   a-fib  . GERD (gastroesophageal reflux disease)   . H/O: hypothyroidism    a. thyroid biopsy 1996 with synthroid. Stopped taking rx and was loss to follow up b. normal thyroid function 10/20/13  . High cholesterol   . Hx of cardiovascular stress test    ETT/Lexiscan Myoview (12/2013):  No ischemia; not gated; low risk.  Marland Kitchen Hypertension   . Obesity   . Persistent atrial fibrillation ([place])    a diagnosed 10/25/2013  . Psoriasis    on Skyrizi  . Seasonal allergies   . Stroke [facility]) 2016   "some speech problems since" (06/30/2017)  . 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 2/2 Afib with RVR for unknown duration (EF: 40-45%)  . Thyroid disease   . Type II diabetes mellitus ([place])    a. hg A1c 10, newly diagnosed (10/26/13)    Past Surgical History:  Procedure Laterality Date  . ATRIAL FIBRILLATION ABLATION  06/30/2017  . ATRIAL FIBRILLATION ABLATION N/A 06/30/2017   Procedure: Atrial Fibrillation Ablation;  Surgeon: Thompson Grayer, MD;  Location: [facility] CV LAB;  Service: Cardiovascular;  Laterality: N/A;  . BICEPT TENODESIS Right 05/24/2020   Procedure: BICEPS  TENODESIS;  Surgeon: Meredith Pel, MD;  Location: [place];  Service: Orthopedics;  Laterality: Right;  . BIOPSY THYROID  1996  . CARDIOVERSION N/A 12/07/2013   Procedure: CARDIOVERSION;  Surgeon: Casandra Doffing, MD;  Location: [place];  Service: Cardiovascular;  Laterality: N/A;  . CARDIOVERSION N/A 03/10/2016   Procedure: CARDIOVERSION;  Surgeon: Thayer Headings, MD;  Location: [facility] ENDOSCOPY;  Service: Cardiovascular;  Laterality: N/A;  . ESOPHAGOGASTRODUODENOSCOPY N/A 12/07/2017   Procedure: ESOPHAGOGASTRODUODENOSCOPY (EGD);  Surgeon: Irene Shipper, MD;  Location: [facility] ENDOSCOPY;  Service: Endoscopy;  Laterality: N/A;  . EYE MUSCLE SURGERY Right ~ 1966  . SHOULDER ARTHROSCOPY WITH ROTATOR CUFF REPAIR Right 05/24/2020   Procedure: right shoulder arthroscopy, rotator cuff tear repair biceps tenodesis;  Surgeon: Meredith Pel, MD;  Location: [place];  Service: Orthopedics;  Laterality: Right;  . TEE WITHOUT CARDIOVERSION N/A 06/29/2017   Procedure: TRANSESOPHAGEAL ECHOCARDIOGRAM (TEE);  Surgeon: Fay Records, MD;  Location: [place];  Service: Cardiovascular;  Laterality: N/A;  . TONSILLECTOMY  1971    There were no vitals filed for this visit.   Subjective Assessment - 08/13/20 1626    Subjective Pt reports no issues. She thinks she's probably lifting ~3-4 lbs. She notes that the bands are starting to feel lighter.    Limitations Lifting  Currently in Pain? No/denies                             [facility] Adult PT Treatment/Exercise - 08/13/20 0001      Shoulder Exercises: Seated   Flexion Strengthening;Right;10 reps;Weights    Flexion Weight (lbs) 4    Abduction Strengthening;Right;10 reps;Weights    ABduction Weight (lbs) 4    Other Seated Exercises shoulder press x10 with 4lbs,  mid deltoid raise x10 with 4lbs    Other Seated Exercises bicep curl 2x10 with 4lbs, 5lbs overhead x5      Shoulder Exercises: Standing    Other Standing Exercises amb 200' with 15#; amb 100' with 20#    Other Standing Exercises shoulder press up 3# 2x10, 5# overhead into cabinet x 5      Shoulder Exercises: ROM/Strengthening   UBE (Upper Arm Bike) L3.5, 4 min forward, 4 min backward    Wall Pushups 10 reps   counter   Wall Pushups Limitations on counter    Modified Plank 60 seconds                    PT Short Term Goals - 07/05/20 1701      PT SHORT TERM GOAL #1   Title Pt will be independent with AROM for her elbow, wrist, and hand    Time 4    Period Weeks    Status Achieved    Target Date 07/06/20             PT Long Term Goals - 08/13/20 1631      PT LONG TERM GOAL #1   Title Pt will be independent with advanced HEP    Time 10    Period Weeks    Status Achieved      PT LONG TERM GOAL #2   Title Pt will improve FOTO score from 53% limitation to 36%    Time 10    Period Weeks    Status Achieved      PT LONG TERM GOAL #3   Title Pt will be 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 Pt has met all of her long term goals and is ready for d/c from PT. Pt's R shoulder ROM is symmetrical to L shoulder. Pt strength is the least with overhead press up; but otherwise, her functional ability has greatly increased. FOTO score improved to 31%. Pt provided advanced HEP and how to progress her band exercises at home with green and blue tband.    Personal Factors and Comorbidities Age;Comorbidity  1;Comorbidity 2    Comorbidities DM, OA  Examination-Activity Limitations Lift;Hygiene/Grooming;Reach Overhead;Caring for Others    Examination-Participation Restrictions Yard Work;Cleaning;Community Activity    Rehab Potential Good    PT Frequency 2x / week    PT Duration 8 weeks    PT Treatment/Interventions ADLs/Self Care Home Management;Cryotherapy;Electrical Stimulation;Iontophoresis 47m/ml Dexamethasone;Moist Heat;Ultrasound;Functional mobility training;Therapeutic activities;Therapeutic exercise;Neuromuscular re-education;Patient/family education;Manual techniques;Scar mobilization;Passive range of motion;Dry needling;Taping;Vasopneumatic Device    PT Next Visit Plan Continue to progress strengthening. Vaso as needed. Shoulder IR stretching.    PT Home Exercise Plan [id number] bicep, tricep, shoulder press up, shoulder flexion & abduction    Consulted and Agree with Plan of Care Patient           PHYSICAL THERAPY DISCHARGE SUMMARY  Visits from Start of Care: 13  Current functional level related to goals / functional outcomes: All goals met   Remaining deficits: Mild weakness with R overhead press up compared to left shoulder   Education / Equipment: Advanced HEP and how to progress her exercises at home.   Plan: Patient agrees to discharge.  Patient goals were met. Patient is being discharged due to meeting the stated rehab goals.  ?????       Patient will benefit from skilled therapeutic intervention in order to improve the following deficits and impairments:  Decreased mobility, Hypomobility, Decreased scar mobility, Decreased coordination, Decreased strength, Increased fascial restricitons, Impaired flexibility, Impaired UE functional use, Postural dysfunction  Visit Diagnosis: Stiffness of right shoulder, not elsewhere classified  Chronic right shoulder pain  S/P right rotator cuff repair     Problem List Patient Active Problem List   Diagnosis Date Noted    . Multinodular goiter 09/16/2019  . Chronic pain disorder 08/09/2018  . Diabetic polyneuropathy associated with type 2 diabetes mellitus ([place] 08/09/2018  . Gastroparesis 12/21/2017  . GI bleed 12/06/2017  . Hepatic steatosis 12/04/2017  . Chronic systolic CHF (congestive heart failure) ([place] 09/16/2015  . Cerebrovascular accident (CVA) due to embolism of left middle cerebral artery ([place]   . Low back pain 04/03/2014  . Unspecified vitamin D deficiency 01/20/2014  . Essential hypertension, benign 01/20/2014  . Chronic anticoagulation 10/31/2013  . Tachycardia induced cardiomyopathy ([place]   . H/O: hypothyroidism   . Obesity   . Type 2 diabetes mellitus with diabetic polyneuropathy, with long-term current use of insulin ([place]   . Arthritis   . Dyslipidemia   . Atrial fibrillation  10/25/2013    Sheba Whaling April Ma L Honest Vanleer PT, DPT 08/13/2020, 5:16 PM  [facility] [address]Phone: [phone]  Fax:  [phone] Name: NRashell ShambaughMRN: [id number] of Birth: 409-16-1962

## 2020-08-16 ENCOUNTER — Other Ambulatory Visit: Payer: Self-pay

## 2020-08-16 ENCOUNTER — Encounter: Payer: Self-pay | Admitting: Internal Medicine

## 2020-08-16 ENCOUNTER — Ambulatory Visit (HOSPITAL_BASED_OUTPATIENT_CLINIC_OR_DEPARTMENT_OTHER): Payer: 59 | Admitting: Pharmacist

## 2020-08-16 ENCOUNTER — Ambulatory Visit: Payer: 59 | Attending: Internal Medicine | Admitting: Internal Medicine

## 2020-08-16 VITALS — BP 134/85 | HR 86 | Temp 98.3°F | Resp 16 | Ht 65.5 in | Wt 255.2 lb

## 2020-08-16 DIAGNOSIS — I10 Essential (primary) hypertension: Secondary | ICD-10-CM

## 2020-08-16 DIAGNOSIS — M2550 Pain in unspecified joint: Secondary | ICD-10-CM

## 2020-08-16 DIAGNOSIS — E1142 Type 2 diabetes mellitus with diabetic polyneuropathy: Secondary | ICD-10-CM

## 2020-08-16 DIAGNOSIS — L97522 Non-pressure chronic ulcer of other part of left foot with fat layer exposed: Secondary | ICD-10-CM

## 2020-08-16 DIAGNOSIS — Z23 Encounter for immunization: Secondary | ICD-10-CM | POA: Diagnosis not present

## 2020-08-16 DIAGNOSIS — Z794 Long term (current) use of insulin: Secondary | ICD-10-CM

## 2020-08-16 DIAGNOSIS — E785 Hyperlipidemia, unspecified: Secondary | ICD-10-CM

## 2020-08-16 DIAGNOSIS — I5022 Chronic systolic (congestive) heart failure: Secondary | ICD-10-CM

## 2020-08-16 DIAGNOSIS — S90422A Blister (nonthermal), left great toe, initial encounter: Secondary | ICD-10-CM

## 2020-08-16 DIAGNOSIS — E1169 Type 2 diabetes mellitus with other specified complication: Secondary | ICD-10-CM

## 2020-08-16 DIAGNOSIS — E538 Deficiency of other specified B group vitamins: Secondary | ICD-10-CM

## 2020-08-16 DIAGNOSIS — R42 Dizziness and giddiness: Secondary | ICD-10-CM

## 2020-08-16 DIAGNOSIS — Z6841 Body Mass Index (BMI) 40.0 and over, adult: Secondary | ICD-10-CM

## 2020-08-16 LAB — GLUCOSE, POCT (MANUAL RESULT ENTRY): POC Glucose: 208 mg/dl — AB (ref 70–99)

## 2020-08-16 MED ORDER — MUPIROCIN 2 % EX OINT: TOPICAL_OINTMENT | CUTANEOUS | 0 refills | Status: DC

## 2020-08-16 MED ORDER — DICLOFENAC SODIUM 1 % EX GEL: 2.0000 g | Freq: Four times a day (QID) | CUTANEOUS | 3 refills | Status: DC

## 2020-08-16 MED ORDER — LANTUS SOLOSTAR 100 UNIT/ML ~~LOC~~ SOPN: 155.0000 [IU] | PEN_INJECTOR | SUBCUTANEOUS | 11 refills | Status: DC

## 2020-08-16 MED FILL — DICLOFENAC SODIUM 1% GEL: 1 | 12 days supply | Qty: 100 | Fill #0

## 2020-08-16 MED FILL — MUPIROCIN 2% OINTMENT: 2 | 10 days supply | Qty: 22 | Fill #0

## 2020-08-16 NOTE — Progress Notes (Signed)
Patient presents for vaccination against influenza per orders of Dr. Johnson. Consent given. Counseling provided. No contraindications exists. Vaccine administered without incident.  ° °Luke Van Ausdall, PharmD, CPP °Clinical Pharmacist °[facility] °[phone] ° °

## 2020-08-16 NOTE — Patient Instructions (Addendum)
Increase Lantus to 155 units daily. Check your blood pressure at least twice a week.  The goal is 130/80 or lower.  You can send me your next 2 readings.  Wear close and shoes at all times.  Apply the Bactroban ointment to the ulcer on your left foot daily.  Do dressing changes once a day as instructed.  I have referred you to a podiatrist for further evaluation and management.  Go slow with position changes.   Benign Positional Vertigo Vertigo is the feeling that you or your surroundings are moving when they are not. Benign positional vertigo is the most common form of vertigo. This is usually a harmless condition (benign). This condition is positional. This means that symptoms are triggered by certain movements and positions. This condition can be dangerous if it occurs while you are doing something that could cause harm to you or others. This includes activities such as driving or operating machinery. What are the causes? In many cases, the cause of this condition is not known. It may be caused by a disturbance in an area of the inner ear that helps your brain to sense movement and balance. This disturbance can be caused by:  Viral infection (labyrinthitis).  Head injury.  Repetitive motion, such as jumping, dancing, or running. What increases the risk? You are more likely to develop this condition if:  You are a woman.  You are 57 years of age or older. What are the signs or symptoms? Symptoms of this condition usually happen when you move your head or your eyes in different directions. Symptoms may start suddenly, and usually last for less than a minute. They include:  Loss of balance and falling.  Feeling like you are spinning or moving.  Feeling like your surroundings are spinning or moving.  Nausea and vomiting.  Blurred vision.  Dizziness.  Involuntary eye movement (nystagmus). Symptoms can be mild and cause only minor problems, or they can be severe and interfere  with daily life. Episodes of benign positional vertigo may return (recur) over time. Symptoms may improve over time. How is this diagnosed? This condition may be diagnosed based on:  Your medical history.  Physical exam of the head, neck, and ears.  Tests, such as: ? MRI. ? CT scan. ? Eye movement tests. Your health care provider may ask you to change positions quickly while he or she watches you for symptoms of benign positional vertigo, such as nystagmus. Eye movement may be tested with a variety of exams that are designed to evaluate or stimulate vertigo. ? An electroencephalogram (EEG). This records electrical activity in your brain. ? Hearing tests. You may be referred to a health care provider who specializes in ear, nose, and throat (ENT) problems (otolaryngologist) or a provider who specializes in disorders of the nervous system (neurologist). How is this treated?  This condition may be treated in a session in which your health care provider moves your head in specific positions to adjust your inner ear back to normal. Treatment for this condition may take several sessions. Surgery may be needed in severe cases, but this is rare. In some cases, benign positional vertigo may resolve on its own in 2-4 weeks. Follow these instructions at home: Safety  Move slowly. Avoid sudden body or head movements or certain positions, as told by your health care provider.  Avoid driving until your health care provider says it is safe for you to do so.  Avoid operating heavy machinery until your health  care provider says it is safe for you to do so.  Avoid doing any tasks that would be dangerous to you or others if vertigo occurs.  If you have trouble walking or keeping your balance, try using a cane for stability. If you feel dizzy or unstable, sit down right away.  Return to your normal activities as told by your health care provider. Ask your health care provider what activities are safe  for you. General instructions  Take over-the-counter and prescription medicines only as told by your health care provider.  Drink enough fluid to keep your urine pale yellow.  Keep all follow-up visits as told by your health care provider. This is important. Contact a health care provider if:  You have a fever.  Your condition gets worse or you develop new symptoms.  Your family or friends notice any behavioral changes.  You have nausea or vomiting that gets worse.  You have numbness or a "pins and needles" sensation. Get help right away if you:  Have difficulty speaking or moving.  Are always dizzy.  Faint.  Develop severe headaches.  Have weakness in your legs or arms.  Have changes in your hearing or vision.  Develop a stiff neck.  Develop sensitivity to light. Summary  Vertigo is the feeling that you or your surroundings are moving when they are not. Benign positional vertigo is the most common form of vertigo.  The cause of this condition is not known. It may be caused by a disturbance in an area of the inner ear that helps your brain to sense movement and balance.  Symptoms include loss of balance and falling, feeling that you or your surroundings are moving, nausea and vomiting, and blurred vision.  This condition can be diagnosed based on symptoms, physical exam, and other tests, such as MRI, CT scan, eye movement tests, and hearing tests.  Follow safety instructions as told by your health care provider. You will also be told when to contact your health care provider in case of problems. This information is not intended to replace advice given to you by your health care provider. Make sure you discuss any questions you have with your health care provider. Document Revised: 04/28/2018 Document Reviewed: 04/28/2018 Elsevier Patient Education  2020 [employer]   Influenza Virus Vaccine injection (Fluarix) What is this medicine? INFLUENZA VIRUS VACCINE (in  floo EN zuh VAHY ruhs vak SEEN) helps to reduce the risk of getting influenza also known as the flu. This medicine may be used for other purposes; ask your health care provider or pharmacist if you have questions. COMMON BRAND NAME(S): Fluarix, Fluzone What should I tell my health care provider before I take this medicine? They need to know if you have any of these conditions:  bleeding disorder like hemophilia  fever or infection  Guillain-Barre syndrome or other neurological problems  immune system problems  infection with the human immunodeficiency virus (HIV) or AIDS  low blood platelet counts  multiple sclerosis  an unusual or allergic reaction to influenza virus vaccine, eggs, chicken proteins, latex, gentamicin, other medicines, foods, dyes or preservatives  pregnant or trying to get pregnant  breast-feeding How should I use this medicine? This vaccine is for injection into a muscle. It is given by a health care professional. A copy of Vaccine Information Statements will be given before each vaccination. Read this sheet carefully each time. The sheet may change frequently. Talk to your pediatrician regarding the use of this medicine in children.  Special care may be needed. Overdosage: If you think you have taken too much of this medicine contact a poison control center or emergency room at once. NOTE: This medicine is only for you. Do not share this medicine with others. What if I miss a dose? This does not apply. What may interact with this medicine?  chemotherapy or radiation therapy  medicines that lower your immune system like etanercept, anakinra, infliximab, and adalimumab  medicines that treat or prevent blood clots like warfarin  phenytoin  steroid medicines like prednisone or cortisone  theophylline  vaccines This list may not describe all possible interactions. Give your health care provider a list of all the medicines, herbs, non-prescription drugs,  or dietary supplements you use. Also tell them if you smoke, drink alcohol, or use illegal drugs. Some items may interact with your medicine. What should I watch for while using this medicine? Report any side effects that do not go away within 3 days to your doctor or health care professional. Call your health care provider if any unusual symptoms occur within 6 weeks of receiving this vaccine. You may still catch the flu, but the illness is not usually as bad. You cannot get the flu from the vaccine. The vaccine will not protect against colds or other illnesses that may cause fever. The vaccine is needed every year. What side effects may I notice from receiving this medicine? Side effects that you should report to your doctor or health care professional as soon as possible:  allergic reactions like skin rash, itching or hives, swelling of the face, lips, or tongue Side effects that usually do not require medical attention (report to your doctor or health care professional if they continue or are bothersome):  fever  headache  muscle aches and pains  pain, tenderness, redness, or swelling at site where injected  weak or tired This list may not describe all possible side effects. Call your doctor for medical advice about side effects. You may report side effects to FDA at 1-800-FDA-1088. Where should I keep my medicine? This vaccine is only given in a clinic, pharmacy, doctor's office, or other health care setting and will not be stored at home. NOTE: This sheet is a summary. It may not cover all possible information. If you have questions about this medicine, talk to your doctor, pharmacist, or health care provider.  2020 Elsevier/[employer] Standard (2008-06-14 09:30:40)

## 2020-08-16 NOTE — Progress Notes (Signed)
Patient ID: Kelly Lang, female    DOB: 1961/07/31  MRN: [id number]  CC: Diabetes, Hypertension, and Medication Refill   Subjective: Kelly Lang is a 59 y.o. female who presents for chronic ds management.  Last seen 09/2019 Her concerns today include:  Pt with hx of a.flutters/p ablation7/2018on Xarelto, DM type 2 ,gastroparesis,obesity, HL, HTN, chronic systolic CHF with WV37-10%, CVA, polyarthalgia on Cymbalta, psoriasis, COVID infection 01/2020.  DIABETES TYPE 2 Last A1C:   Results for orders placed or performed in visit on 08/16/20  POCT glucose (manual entry)  Result Value Ref Range   POC Glucose 208 (A) 70 - 99 mg/dl    Med Adherence:  '[x]'  Yes.  She has been seeing Dr. Loanne Drilling since last visit with me.  Jardiance d/c due to recurrent yeast infection.  He decreased Glipizide 2.5 mg once a day with goal to get her off it.  Pt reports BS running high since Glipizide decreased. BS have been higher.  She is on Ozempic and tolerating the medication.  She does not take to Dr. Loanne Drilling well.  She feels he is pushing the wgh reduction surgery issue and she does not see it as a option for her at this time because her co-pay for the procedure is much more than she can afford right now and she is also not able to take that much time off work after the surgery recovery.  Her health insurance plan offers a keto-diet and we will set her up with a nutritionist.. Would like to see a different endocrinologist.  Medication side effects:  '[]'  Yes    '[x]'  No Home Monitoring?  '[x]'  Yes  1-2  X/day Home glucose results range: a.m readings average about 200. Diet Adherence: She tries very hard to eat healthy and is discouraged that her weight is not changing. Exercise: '[x]'  Yes -swimming a few times a wk Hypoglycemic episodes?: '[]'  Yes    '[x]'  No Numbness of the feet? '[x]'  Yes    '[]'  No Retinopathy hx? '[]'  Yes    '[x]'  No Last eye exam:  Comments:   HYPERTENSION/CHF/a.fib Currently taking: see  medication list Med Adherence: '[x]'  Yes    '[]'  No Medication side effects: '[]'  Yes    '[x]'  No Adherence with salt restriction: '[x]'  Yes    '[]'  No Home Monitoring?: '[]'  Yes    '[x]'  No but does have access to a device. Monitoring Frequency: '[]'  Yes    '[]'  No Home BP results range: '[]'  Yes    '[]'  No SOB? '[]'  Yes    '[x]'  No Chest Pain?: '[]'  Yes    '[x]'  No Leg swelling?: '[]'  Yes    '[x]'  No Headaches?: '[x]'  Yes -yesterday for a little while    '[]'  No Dizziness? '[x]'  Yes -since yesterday. She check BP at the time and it was slightly elevated. Occurs with position changes, head movements and rolling over in bed.  Last few seconds. No ringing in ear or hearing changes Comments: no palpitations.  No bruising or bleeding on Xarelto  C/o blister on LT foot big toe and split in skin 2nd toe x 2 wks. she attributes it to wearing some pointed toe shoes that were ill fitting about 2 weeks ago.  She denies any pain over the blister or the ulcer.    Since last visit with me, she has had rotator cuff repair surgery on the right shoulder done by Dr. Marlou Sa.  She is currently doing physical therapy.  Request prescriptions for some  Voltaren gel to rub on the arm.  On last visit with me, we have checked a vitamin B12 level.  It was in the low normal range.  I sent her a MyChart message recommending that she takes vitamin B-12 500 mcg daily.  Patient states she does not recall the message.  She is not taking vitamin B12.  Patient Active Problem List   Diagnosis Date Noted  . Multinodular goiter 09/16/2019  . Chronic pain disorder 08/09/2018  . Diabetic polyneuropathy associated with type 2 diabetes mellitus ([place]) 08/09/2018  . Gastroparesis 12/21/2017  . GI bleed 12/06/2017  . Hepatic steatosis 12/04/2017  . Chronic systolic CHF (congestive heart failure) ([place]) 09/16/2015  . Cerebrovascular accident (CVA) due to embolism of left middle cerebral artery ([place])   . Low back pain 04/03/2014  . Unspecified vitamin D deficiency 01/20/2014   . Essential hypertension, benign 01/20/2014  . Chronic anticoagulation 10/31/2013  . Tachycardia induced cardiomyopathy ([place])   . H/O: hypothyroidism   . Class 3 severe obesity with serious comorbidity and body mass index (BMI) of 40.0 to 44.9 in adult [facility])   . Type 2 diabetes mellitus with diabetic polyneuropathy, with long-term current use of insulin ([place])   . Arthritis   . Dyslipidemia   . Atrial fibrillation  10/25/2013     Current Outpatient Medications on File Prior to Visit  Medication Sig Dispense Refill  . atorvastatin (LIPITOR) 40 MG tablet Take 1 tablet (40 mg total) by mouth daily. 30 tablet 1  . betamethasone dipropionate 0.05 % cream APPLY 1 APPLICATION OF CREAM TWICE DAILY AS NEEDED FOR FLARES    . Blood Glucose Monitoring Suppl ([place]) w/Device KIT Use as instructed to check blood sugar up to 3 times daily. 1 kit 0  . diclofenac sodium (VOLTAREN) 1 % GEL APPLY 2 GRAMS TOPICALLY 4 (FOUR) TIMES DAILY AS NEEDED (PAIN). 100 g 5  . DULoxetine (CYMBALTA) 20 MG capsule TAKE 1 CAPSULE (20 MG TOTAL) BY MOUTH DAILY. 30 capsule 0  . glipiZIDE (GLUCOTROL) 5 MG tablet Take 0.5 tablets (2.5 mg total) by mouth daily before breakfast. 60 tablet 3  . Insulin Pen Needle (ULTICARE MICRO PEN NEEDLES) 32G X 4 MM MISC 1 applicator by Does not apply route at bedtime. 100 each 2  . lisinopril (ZESTRIL) 10 MG tablet Take 0.5 tablets (5 mg total) by mouth daily. 15 tablet 1  . metFORMIN (GLUCOPHAGE) 1000 MG tablet TAKE 1 TABLET (1,000 MG TOTAL) BY MOUTH 2 (TWO) TIMES DAILY WITH A MEAL. 60 tablet 3  . metoprolol tartrate (LOPRESSOR) 25 MG tablet Take 1.5 tablets (37.5 mg total) by mouth 2 (two) times daily. Please make yearly appt with Dr. Acie Fredrickson for November for future refills. 1st attempt 270 tablet 0  . metroNIDAZOLE (METROCREAM) 0.75 % cream APPLY THIN LAYER TO FACE TWICE DAILY    . ONETOUCH DELICA LANCETS 97D MISC Use as instructed to check blood sugar up to 3 times daily.  100 each 11  . ONETOUCH VERIO test strip USE AS DIRECTED UP TO THREE TIMES DAILY TO  TEST  BLOOD  SUGAR 100 each 11  . OZEMPIC, 1 MG/DOSE, 4 MG/3ML SOPN INJECT 1 MG INTO THE SKIN ONCE A WEEK    . Semaglutide, 1 MG/DOSE, (OZEMPIC, 1 MG/DOSE,) 2 MG/1.5ML SOPN Inject 1 mg into the skin once a week. 2 pen 11  . SKYRIZI, 150 MG DOSE, 75 MG/0.83ML PSKT Inject 150 mg as directed See admin instructions. 4x's per year    .  XARELTO 20 MG TABS tablet TAKE 1 TABLET BY MOUTH DAILY WITH SUPPER. 30 tablet 10   No current facility-administered medications on file prior to visit.    Allergies  Allergen Reactions  . Jardiance [Empagliflozin] Other (See Comments)    Yeast infections  . Codeine Itching and Rash    Social History   Socioeconomic History  . Marital status: Divorced    Spouse name: Not on file  . Number of children: 0  . Years of education: 83  . Highest education level: Not on file  Occupational History  . Occupation: Chief Technology Officer  Tobacco Use  . Smoking status: Former Smoker    Packs/day: 1.00    Years: 22.00    Pack years: 22.00    Types: Cigarettes    Quit date: 12/01/2010    Years since quitting: 9.7  . Smokeless tobacco: Never Used  Vaping Use  . Vaping Use: Never used  Substance and Sexual Activity  . Alcohol use: Yes    Comment: social  . Drug use: No  . Sexual activity: Not Currently  Other Topics Concern  . Not on file  Social History Narrative   Moved to [place] from [place] in 2002.     Fun: shop, sew, decorate   Denies any beliefs effecting healthcare   Social Determinants of Health   Financial Resource Strain:   . Difficulty of Paying Living Expenses: Not on file  Food Insecurity:   . Worried About Charity fundraiser in the Last Year: Not on file  . Ran Out of Food in the Last Year: Not on file  Transportation Needs:   . Lack of Transportation (Medical): Not on file  . Lack of Transportation (Non-Medical): Not on file  Physical Activity:     . Days of Exercise per Week: Not on file  . Minutes of Exercise per Session: Not on file  Stress:   . Feeling of Stress : Not on file  Social Connections:   . Frequency of Communication with Friends and Family: Not on file  . Frequency of Social Gatherings with Friends and Family: Not on file  . Attends Religious Services: Not on file  . Active Member of Clubs or Organizations: Not on file  . Attends Archivist Meetings: Not on file  . Marital Status: Not on file  Intimate Partner Violence:   . Fear of Current or Ex-Partner: Not on file  . Emotionally Abused: Not on file  . Physically Abused: Not on file  . Sexually Abused: Not on file    Family History  Adopted: Yes  Problem Relation Age of Onset  . Hypertension Mother   . Hyperlipidemia Mother     Past Surgical History:  Procedure Laterality Date  . ATRIAL FIBRILLATION ABLATION  06/30/2017  . ATRIAL FIBRILLATION ABLATION N/A 06/30/2017   Procedure: Atrial Fibrillation Ablation;  Surgeon: Thompson Grayer, MD;  Location: [place] [facility];  Service: Cardiovascular;  Laterality: N/A;  . BICEPT TENODESIS Right 05/24/2020   Procedure: BICEPS TENODESIS;  Surgeon: Meredith Pel, MD;  Location: [place];  Service: Orthopedics;  Laterality: Right;  . BIOPSY THYROID  1996  . CARDIOVERSION N/A 12/07/2013   Procedure: CARDIOVERSION;  Surgeon: Casandra Doffing, MD;  Location: [facility] ENDOSCOPY;  Service: Cardiovascular;  Laterality: N/A;  . CARDIOVERSION N/A 03/10/2016   Procedure: CARDIOVERSION;  Surgeon: Thayer Headings, MD;  Location: [place];  Service: Cardiovascular;  Laterality: N/A;  . ESOPHAGOGASTRODUODENOSCOPY N/A 12/07/2017  Procedure: ESOPHAGOGASTRODUODENOSCOPY (EGD);  Surgeon: Irene Shipper, MD;  Location: [facility] ENDOSCOPY;  Service: Endoscopy;  Laterality: N/A;  . EYE MUSCLE SURGERY Right ~ 1966  . SHOULDER ARTHROSCOPY WITH ROTATOR CUFF REPAIR Right 05/24/2020   Procedure: right shoulder arthroscopy,  rotator cuff tear repair biceps tenodesis;  Surgeon: Meredith Pel, MD;  Location: [place];  Service: Orthopedics;  Laterality: Right;  . TEE WITHOUT CARDIOVERSION N/A 06/29/2017   Procedure: TRANSESOPHAGEAL ECHOCARDIOGRAM (TEE);  Surgeon: Fay Records, MD;  Location: [facility] ENDOSCOPY;  Service: Cardiovascular;  Laterality: N/A;  . TONSILLECTOMY  1971    ROS: Review of Systems Negative except as stated above  PHYSICAL EXAM: BP (!) 141/85   Pulse 86   Temp 98.3 F (36.8 C)   Resp 16   Ht 5' 5.5" (1.664 m)   Wt 255 lb 3.2 oz (115.8 kg)   LMP 04/15/2015 (LMP Unknown) Comment: last in may  SpO2 95%   BMI 41.82 kg/m   Wt Readings from Last 3 Encounters:  08/16/20 255 lb 3.2 oz (115.8 kg)  08/03/20 249 lb (112.9 kg)  06/20/20 249 lb 6.4 oz (113.1 kg)  Blood pressure sitting 134/85, pulse 69 Blood pressure standing 130/86, pulse 79  Physical Exam  General appearance - alert, well appearing, older obese Caucasian female and in no distress Mental status -patient teary-eyed at times and talking about her medical issues. Eyes - pupils equal and reactive, extraocular eye movements intact Neck - supple, no significant adenopathy Chest - clear to auscultation, no wheezes, rales or rhonchi, symmetric air entry Heart - normal rate, regular rhythm, normal S1, S2, no murmurs, rubs, clicks or gallops Extremities - peripheral pulses normal, no pedal edema, no clubbing or cyanosis Diabetic Foot Exam - Simple   Simple Foot Form Visual Inspection See comments: Yes Sensation Testing See comments: Yes Pulse Check Posterior Tibialis and Dorsalis pulse intact bilaterally: Yes Comments Patient has decreased sensation on the plantar surface of both feet on leap exam.  On the left foot, she has firm nonpainful hematoma on the medial aspect of the big toe.  On the second toe she has a break/linear ulcer in the crease of the skin on the plantar surface.  There is no drainage noted.   No erythema.  Area is not tender to touch.   Of note, she is wearing some ill fitting slippers today that are tight around the toes.   CMP Latest Ref Rng & Units 05/21/2020 04/05/2020 09/08/2019  Glucose 70 - 99 mg/dL 155(H) 247(H) 183(H)  BUN 6 - 20 mg/dL '14 15 16  ' Creatinine 0.44 - 1.00 mg/dL 0.74 0.73 0.84  Sodium 135 - 145 mmol/L 139 138 136  Potassium 3.5 - 5.1 mmol/L 3.9 5.0 4.6  Chloride 98 - 111 mmol/L 104 104 102  CO2 22 - 32 mmol/L '25 22 20  ' Calcium 8.9 - 10.3 mg/dL 9.2 9.0 9.5  Total Protein 6.0 - 8.5 g/dL - - 7.6  Total Bilirubin 0.0 - 1.2 mg/dL - - 0.3  Alkaline Phos 39 - 117 IU/L - - 90  AST 0 - 40 IU/L - - 11  ALT 0 - 32 IU/L - - 22   Lipid Panel     Component Value Date/Time   CHOL 167 09/08/2019 1508   TRIG 353 (H) 09/08/2019 1508   HDL 35 (L) 09/08/2019 1508   CHOLHDL 4.8 (H) 09/08/2019 1508   CHOLHDL 5.2 (H) 11/19/2016 1103   VLDL 59 (H) 11/19/2016 1103  LDLCALC 75 09/08/2019 1508   LDLDIRECT 114.0 02/13/2015 0750    CBC    Component Value Date/Time   WBC 8.0 05/21/2020 0900   RBC 4.49 05/21/2020 0900   HGB 14.6 05/21/2020 0900   HGB 15.2 04/05/2020 0932   HCT 43.9 05/21/2020 0900   HCT 45.8 04/05/2020 0932   PLT 258 05/21/2020 0900   PLT 267 04/05/2020 0932   MCV 97.8 05/21/2020 0900   MCV 98 (H) 04/05/2020 0932   MCH 32.5 05/21/2020 0900   MCHC 33.3 05/21/2020 0900   RDW 12.8 05/21/2020 0900   RDW 12.1 04/05/2020 0932   LYMPHSABS 1.7 11/25/2017 1017   MONOABS 704 03/07/2016 0904   EOSABS 0.3 11/25/2017 1017   BASOSABS 0.1 11/25/2017 1017    ASSESSMENT AND PLAN:  1. Type 2 diabetes mellitus with diabetic polyneuropathy, with long-term current use of insulin (HCC) Not at goal.  She must have significant insulin resistance as she is on Lantus 150 units daily and Ozempic and blood sugars still not close to goal.  I recommend increasing the Lantus insulin 255 units daily. -I explained to her the reasoning behind why Dr. Loanne Drilling is recommending  that she consider weight reduction surgery.  She may be able to achieve remission of the diabetes and if not remission, at least significant reduction in medications for it Inquired whether she has short-term disability as she can use it for coverage should she decide to have weight reduction surgery.  She does not. -We will request a different endocrinologist for her. - POCT glucose (manual entry) - Ambulatory referral to Endocrinology - insulin glargine (LANTUS SOLOSTAR) 100 UNIT/ML Solostar Pen; Inject 155 Units into the skin every morning.  Dispense: 15 mL; Refill: 11 - Microalbumin / creatinine urine ratio  2. Class 3 severe obesity with serious comorbidity and body mass index (BMI) of 40.0 to 44.9 in adult, unspecified obesity type ([place]) Encouraged her to continue trying to eat healthy and with smaller portions.  Continue regular exercise she swims for exercise.  3. Essential hypertension Not at goal.  Advised her to check blood pressure at least twice a week with goal being 130/80 or lower.  If she is running consistently higher than this, I request that she send me a MyChart message so that we can adjust medications.  Continue lisinopril and metoprolol  4. Chronic systolic CHF (congestive heart failure) (HCC) Compensated.  Continue lisinopril and metoprolol.  5. Hyperlipidemia associated with type 2 diabetes mellitus ([place]) Continue atorvastatin - Hepatic function panel - Lipid panel  6. Dizziness Likely benign positional vertigo.  Discussed diagnosis and course of BPV.  Entered information given to her on how to do Epley maneuvers at home.  If it persist she will let me know so that we can refer her for vestibular training - CBC  7. Skin ulcer of second toe of left foot with fat layer exposed ([place]) Concerning.  Advised patient to always wear shoes that are not tight or ill fitting.  Avoid walking barefoot especially outside given that she has neuropathy. -We will get her to  podiatry as soon as possible to evaluate.  In the meantime I recommend doing dressing changes once a day by cleaning the area with plain water, dabbing it dry, then applying Bactroban ointment.  Advised to apply a 2 x 2 gauze around it once she has applied the ointment. - Ambulatory referral to Podiatry - mupirocin ointment (BACTROBAN) 2 %; Apply once a day to the affected area.  Dispense: 22 g; Refill: 0  8. Blister of left great toe, initial encounter - Ambulatory referral to Podiatry - mupirocin ointment (BACTROBAN) 2 %; Apply once a day to the affected area.  Dispense: 22 g; Refill: 0  9. Vitamin B12 deficiency - Vitamin B12  10. Polyarthralgia Refill Voltaren gel    Patient was given the opportunity to ask questions.  Patient verbalized understanding of the plan and was able to repeat key elements of the plan.   Orders Placed This Encounter  Procedures  . Hepatic function panel  . Lipid panel  . Vitamin B12  . Microalbumin / creatinine urine ratio  . CBC  . Ambulatory referral to Podiatry  . Ambulatory referral to Endocrinology  . POCT glucose (manual entry)     Requested Prescriptions   Signed Prescriptions Disp Refills  . insulin glargine (LANTUS SOLOSTAR) 100 UNIT/ML Solostar Pen 15 mL 11    Sig: Inject 155 Units into the skin every morning.  . mupirocin ointment (BACTROBAN) 2 % 22 g 0    Sig: Apply once a day to the affected area.  . diclofenac Sodium (VOLTAREN) 1 % GEL 100 g 3    Sig: Apply 2 g topically 4 (four) times daily.    Return in about 3 months (around 11/15/2020).  Karle Plumber, MD, FACP

## 2020-08-17 LAB — HEPATIC FUNCTION PANEL
ALT: 21 IU/L (ref 0–32)
AST: 12 IU/L (ref 0–40)
Albumin: 4 g/dL (ref 3.8–4.9)
Alkaline Phosphatase: 82 IU/L (ref 44–121)
Bilirubin Total: 0.3 mg/dL (ref 0.0–1.2)
Bilirubin, Direct: <0.1 mg/dL (ref 0.00–0.40)
Total Protein: 6.9 g/dL (ref 6.0–8.5)

## 2020-08-17 LAB — CBC
Hematocrit: 42.4 % (ref 34.0–46.6)
Hemoglobin: 14.9 g/dL (ref 11.1–15.9)
MCH: 33 pg (ref 26.6–33.0)
MCHC: 35.1 g/dL (ref 31.5–35.7)
MCV: 94 fL (ref 79–97)
Platelets: 276 10*3/uL (ref 150–450)
RBC: 4.52 x10E6/uL (ref 3.77–5.28)
RDW: 11.9 % (ref 11.7–15.4)
WBC: 7.5 10*3/uL (ref 3.4–10.8)

## 2020-08-17 LAB — LIPID PANEL
Chol/HDL Ratio: 3.9 ratio (ref 0.0–4.4)
Cholesterol, Total: 140 mg/dL (ref 100–199)
HDL: 36 mg/dL — ABNORMAL LOW (ref >39)
LDL Chol Calc (NIH): 76 mg/dL (ref 0–99)
Triglycerides: 162 mg/dL — ABNORMAL HIGH (ref 0–149)
VLDL Cholesterol Cal: 28 mg/dL (ref 5–40)

## 2020-08-17 LAB — MICROALBUMIN / CREATININE URINE RATIO
Creatinine, Urine: 129.2 mg/dL
Microalb/Creat Ratio: 84 mg/g creat — ABNORMAL HIGH (ref 0–29)
Microalbumin, Urine: 108 ug/mL

## 2020-08-17 LAB — VITAMIN B12: Vitamin B-12: 349 pg/mL (ref 232–1245)

## 2020-08-18 ENCOUNTER — Other Ambulatory Visit: Payer: Self-pay | Admitting: Internal Medicine

## 2020-08-18 DIAGNOSIS — I10 Essential (primary) hypertension: Secondary | ICD-10-CM

## 2020-08-18 MED ORDER — LISINOPRIL 10 MG PO TABS: 10.0000 mg | ORAL_TABLET | Freq: Every day | ORAL | 2 refills | Status: DC

## 2020-08-20 ENCOUNTER — Other Ambulatory Visit: Payer: Self-pay

## 2020-08-20 ENCOUNTER — Encounter: Payer: 59 | Admitting: Physical Therapy

## 2020-08-20 ENCOUNTER — Ambulatory Visit (INDEPENDENT_AMBULATORY_CARE_PROVIDER_SITE_OTHER): Payer: 59 | Admitting: Podiatry

## 2020-08-20 DIAGNOSIS — E0843 Diabetes mellitus due to underlying condition with diabetic autonomic (poly)neuropathy: Secondary | ICD-10-CM

## 2020-08-20 DIAGNOSIS — L97522 Non-pressure chronic ulcer of other part of left foot with fat layer exposed: Secondary | ICD-10-CM | POA: Diagnosis not present

## 2020-08-20 NOTE — Progress Notes (Signed)
Subjective:  59 y.o. female with PMHx of diabetes mellitus presenting to the office today for evaluation of a blister to the left hallux as well as a possible ulcer to the left second toe. Patient states that she wore a pair of shoes that caused a blister and wound to develop to the left second toe. Patient is diabetic with her A1c levels in the 9 region and she is working with her PCP and a new endocrinologist to reduce her blood glucose levels. She presents for further treatment and evaluation   Past Medical History:  Diagnosis Date  . Arthritis    "all over" (06/30/2017)  . Chicken pox   . Dyslipidemia   . Dysrhythmia ablation 2018   a-fib  . GERD (gastroesophageal reflux disease)   . H/O: hypothyroidism    a. thyroid biopsy 1996 with synthroid. Stopped taking rx and was loss to follow up b. normal thyroid function 10/20/13  . High cholesterol   . Hx of cardiovascular stress test    ETT/Lexiscan Myoview (12/2013):  No ischemia; not gated; low risk.  Marland Kitchen Hypertension   . Obesity   . Persistent atrial fibrillation (HCC)    a diagnosed 10/25/2013  . Psoriasis    on Skyrizi  . Seasonal allergies   . Stroke [facility]) 2016   "some speech problems since" (06/30/2017)  . 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 2/2 Afib with RVR for unknown duration (EF: 40-45%)  . Thyroid disease   . Type II diabetes mellitus (HCC)    a. hg A1c 10, newly diagnosed (10/26/13)      Objective/Physical Exam General: The patient is alert and oriented x3 in no acute distress.  Dermatology:  Wound #1 is a fissure along the plantar sulcus of the left second toe measuring approximately 1.5 x 0.2 x 0.1 cm (LxWxD).   To the noted ulceration(s), there is no eschar. There is a moderate amount of slough, fibrin, and necrotic tissue noted. Granulation tissue and wound base is red. There is a minimal amount of serosanguineous drainage noted. There is no exposed bone muscle-tendon ligament or joint. There is no  malodor. Periwound integrity is intact. Skin is warm, dry and supple bilateral lower extremities.  Superficial hemorrhagic blister noted to the plantar aspect of the right hallux. After debridement there is healthy viable skin underneath the blister. The blood blister is removed in toto with a tissue nipper without incident.  Vascular: Palpable pedal pulses bilaterally. No edema or erythema noted. Capillary refill within normal limits.  Neurological: Epicritic and protective threshold diminished bilaterally.   Musculoskeletal Exam: Range of motion within normal limits to all pedal and ankle joints bilateral. Muscle strength 5/5 in all groups bilateral.   Assessment: 1. Ulcer left second toe secondary to diabetes mellitus 2. diabetes mellitus w/ peripheral neuropathy   Plan of Care:  1. Patient was evaluated. 2. medically necessary excisional debridement including subcutaneous tissue was performed using a tissue nipper and a chisel blade. Excisional debridement of all the necrotic nonviable tissue down to healthy bleeding viable tissue was performed with post-debridement measurements same as pre-. 3. the wound was cleansed and dry sterile dressing applied. 4. The left second toe wound is very superficial in nature. Recommend Silvadene cream and a Band-Aid daily x2 weeks  5. The blood blister to the plantar aspect of the hallux was debrided completely and healthy viable skin was noted underlying the blister.  6. Patient is to return to clinic as needed  Edrick Kins, DPM [facility]  Dr. Edrick Kins, [address]                                        [place], [place] [address]                Office [phone]  Fax [phone]

## 2020-08-22 ENCOUNTER — Ambulatory Visit: Payer: 59 | Admitting: Endocrinology

## 2020-08-23 ENCOUNTER — Other Ambulatory Visit: Payer: Self-pay | Admitting: Internal Medicine

## 2020-08-23 DIAGNOSIS — E1165 Type 2 diabetes mellitus with hyperglycemia: Secondary | ICD-10-CM

## 2020-08-23 DIAGNOSIS — E785 Hyperlipidemia, unspecified: Secondary | ICD-10-CM

## 2020-08-23 DIAGNOSIS — IMO0002 Reserved for concepts with insufficient information to code with codable children: Secondary | ICD-10-CM

## 2020-08-23 MED FILL — glipiZIDE 5 MG TABS: 5 | 30 days supply | Qty: 15 | Fill #4

## 2020-08-23 MED FILL — METFORMIN HCL 1000 MG TABS: 1000 | 30 days supply | Qty: 60 | Fill #0

## 2020-08-23 MED FILL — OZEMPIC (1 MG/DOSE) 4 MG/3M: 4 | 28 days supply | Qty: 3 | Fill #3

## 2020-08-23 MED FILL — ATORVASTATIN CALCIUM 40 MG: 40 | 30 days supply | Qty: 60 | Fill #0

## 2020-08-23 MED FILL — BD PEN NDL NANO 32GX5/32: 32G X 4 MM | 25 days supply | Qty: 100 | Fill #0

## 2020-08-23 MED FILL — LANTUS SOLOSTAR 100 UNITS/M: 100 | 22 days supply | Qty: 30 | Fill #5

## 2020-08-23 NOTE — Telephone Encounter (Signed)
Requested Prescriptions  Pending Prescriptions Disp Refills   metFORMIN (GLUCOPHAGE) 1000 MG tablet [Pharmacy Med Name: metFORMIN HCL 1000 MG TABS 1000 Tablet] 180 tablet 0    Sig: TAKE 1 TABLET (1,000 MG TOTAL) BY MOUTH 2 (TWO) TIMES DAILY WITH A MEAL.     Endocrinology:  Diabetes - Biguanides Failed - 08/23/2020  8:50 AM      Failed - HBA1C is between 0 and 7.9 and within 180 days    Hemoglobin A1C  Date Value Ref Range Status  06/20/2020 8.5 (A) 4.0 - 5.6 % Final   HbA1c, POC (controlled diabetic range)  Date Value Ref Range Status  01/25/2019 8.1 (A) 0.0 - 7.0 % Final         Passed - Cr in normal range and within 360 days    Creat  Date Value Ref Range Status  06/16/2016 0.90 0.50 - 1.05 mg/dL Final    Comment:      For patients > or = 57 years of age: The upper reference limit for Creatinine is approximately 13% higher for people identified as African-American.      Creatinine, Ser  Date Value Ref Range Status  05/21/2020 0.74 0.44 - 1.00 mg/dL Final   Creatinine,U  Date Value Ref Range Status  11/13/2014 112.0 mg/dL Final         Passed - eGFR in normal range and within 360 days    GFR, Est African American  Date Value Ref Range Status  06/16/2016 84 >=60 mL/min Final   GFR calc Af Amer  Date Value Ref Range Status  05/21/2020 >60 >60 mL/min Final   GFR, Est Non African American  Date Value Ref Range Status  06/16/2016 73 >=60 mL/min Final   GFR calc non Af Amer  Date Value Ref Range Status  05/21/2020 >60 >60 mL/min Final   GFR  Date Value Ref Range Status  02/13/2015 107.47 >60.00 mL/min Final         Passed - Valid encounter within last 6 months    Recent Outpatient Visits          1 week ago Need for influenza vaccination   [place], Jarome Matin, RPH-CPP   1 week ago Type 2 diabetes mellitus with diabetic polyneuropathy, with long-term current use of insulin ([place])   [place] Karle Plumber B, MD   11 months ago Type 2 diabetes mellitus with diabetic polyneuropathy, with long-term current use of insulin ([place])   [place], Deborah B, MD   1 year ago Paronychia of great toe of right foot   [place], [place], [place]   1 year ago Diabetes mellitus type 2, uncontrolled, with complications [facility])   [place], MD      Future Appointments            In 3 weeks Marlou Sa, Tonna Corner, MD [facility]   In 2 months Ladell Pier, MD [place]            atorvastatin (LIPITOR) 40 MG tablet [Pharmacy Med Name: ATORVASTATIN CALCIUM 40 MG 40 Tablet] 180 tablet 0    Sig: TAKE 1 TABLET (40 MG TOTAL) BY MOUTH DAILY.     Cardiovascular:  Antilipid - Statins Failed - 08/23/2020  8:50 AM  Failed - LDL in normal range and within 360 days    LDL Chol Calc (NIH)  Date Value Ref Range Status  08/16/2020 76 0 - 99 mg/dL Final   Direct LDL  Date Value Ref Range Status  02/13/2015 114.0 mg/dL Final    Comment:    Optimal:  <100 mg/dLNear or Above Optimal:  100-129 mg/dLBorderline High:  130-159 mg/dLHigh:  160-189 mg/dLVery High:  >190 mg/dL         Failed - HDL in normal range and within 360 days    HDL  Date Value Ref Range Status  08/16/2020 36 (L) >39 mg/dL Final         Failed - Triglycerides in normal range and within 360 days    Triglycerides  Date Value Ref Range Status  08/16/2020 162 (H) 0 - 149 mg/dL Final         Passed - Total Cholesterol in normal range and within 360 days    Cholesterol, Total  Date Value Ref Range Status  08/16/2020 140 100 - 199 mg/dL Final         Passed - Patient is not pregnant      Passed - Valid encounter within last 12 months    Recent Outpatient Visits          1 week ago Need for influenza vaccination   [place], Annie Main L, RPH-CPP   1 week ago Type 2 diabetes mellitus with diabetic polyneuropathy, with long-term current use of insulin ([place])   [place] Karle Plumber B, MD   11 months ago Type 2 diabetes mellitus with diabetic polyneuropathy, with long-term current use of insulin ([place])   [place], Deborah B, MD   1 year ago Paronychia of great toe of right foot   [place], [place], [place]   1 year ago Diabetes mellitus type 2, uncontrolled, with complications [facility])   [place], Deborah B, MD      Future Appointments            In 3 weeks Marlou Sa, Tonna Corner, MD [place] [facility]   In 2 months Ladell Pier, MD [place] PEN NEEDLES 32G X 4 MM [place] [Pharmacy Med Name: TRUEPLUS PEN NDL 32GX5/32" 32G X 4 MM Miscellaneous] 100 each 2    Sig: USE AS DIRECTED AT BEDTIME.     Endocrinology: Diabetes - Testing Supplies Passed - 08/23/2020  8:50 AM      Passed - Valid encounter within last 12 months    Recent Outpatient Visits          1 week ago Need for influenza vaccination   [place], Jarome Matin, RPH-CPP   1 week ago Type 2 diabetes mellitus with diabetic polyneuropathy, with long-term current use of insulin [facility])   [place] Karle Plumber B, MD   11 months ago Type 2 diabetes mellitus with diabetic polyneuropathy, with long-term current use of insulin [facility])   [place], MD   1 year ago Paronychia of great toe of right foot   [place], [place], [place]   1 year ago Diabetes  mellitus type 2, uncontrolled, with complications [facility])   [place], MD      Future Appointments            In 3 weeks Marlou Sa, Tonna Corner, MD [facility]   In 2 months Ladell Pier, MD [place]

## 2020-08-29 ENCOUNTER — Encounter: Payer: 59 | Admitting: Physical Therapy

## 2020-09-10 ENCOUNTER — Other Ambulatory Visit: Payer: Self-pay | Admitting: Internal Medicine

## 2020-09-10 ENCOUNTER — Ambulatory Visit: Payer: 59 | Admitting: "Endocrinology

## 2020-09-10 DIAGNOSIS — M2550 Pain in unspecified joint: Secondary | ICD-10-CM

## 2020-09-10 MED FILL — DULoxetine HCL 20 MG CPEP: 20 | 90 days supply | Qty: 90 | Fill #0

## 2020-09-10 MED FILL — LISINOPRIL 10 MG TABS: 10 | 90 days supply | Qty: 90 | Fill #0

## 2020-09-10 MED FILL — METOPROLOL TARTRATE 25 MG T: 25 | 90 days supply | Qty: 270 | Fill #0

## 2020-09-14 ENCOUNTER — Ambulatory Visit (INDEPENDENT_AMBULATORY_CARE_PROVIDER_SITE_OTHER): Payer: 59 | Admitting: Orthopedic Surgery

## 2020-09-14 DIAGNOSIS — M75111 Incomplete rotator cuff tear or rupture of right shoulder, not specified as traumatic: Secondary | ICD-10-CM

## 2020-09-15 ENCOUNTER — Encounter: Payer: Self-pay | Admitting: Orthopedic Surgery

## 2020-09-15 NOTE — Progress Notes (Signed)
Office Visit Note   Patient: Kelly Lang           Date of Birth: 20-Aug-1961           MRN: [id number] Visit Date: 09/14/2020 Requested by: Marcine Matar, MD [address] [place],  [place] [address] PCP: Marcine Matar, MD  Subjective: Chief Complaint  Patient presents with  . Right Shoulder - Routine Post Op    HPI: Kelly Lang is a 59 year old patient who is about 12 weeks out rotator cuff tear repair.  Reports episode with therapy about 3 weeks ago and since that time she has been in constant pain.  She had some type of pulling on her arm to stretch it out.  Taking Aleve.  On exam the rotator cuff repair is intact.  That is based on palpation and strength.  She still has a little shoulder stiffness.  I think she should continue to work this out and I will see her back as needed.  No definite structural problem at this time with the rotator cuff tear repair.              ROS: See above  Assessment & Plan: Visit Diagnoses:  1. Nontraumatic incomplete tear of right rotator cuff     Plan: See above  Follow-Up Instructions: No follow-ups on file.   Orders:  No orders of the defined types were placed in this encounter.  No orders of the defined types were placed in this encounter.     Procedures: No procedures performed   Clinical Data: No additional findings.  Objective: Vital Signs: LMP 04/15/2015 (LMP Unknown) Comment: last in may  Physical Exam: See above  Ortho Exam: See above  Specialty Comments:  No specialty comments available.  Imaging: No results found.   PMFS History: Patient Active Problem List   Diagnosis Date Noted  . Multinodular goiter 09/16/2019  . Chronic pain disorder 08/09/2018  . Diabetic polyneuropathy associated with type 2 diabetes mellitus (HCC) 08/09/2018  . Gastroparesis 12/21/2017  . GI bleed 12/06/2017  . Hepatic steatosis 12/04/2017  . Chronic systolic CHF (congestive heart failure) (HCC) 09/16/2015  .  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 Low back pain 04/03/2014  . Unspecified vitamin D deficiency 01/20/2014  . Essential hypertension, benign 01/20/2014  . Chronic anticoagulation 10/31/2013  . Tachycardia induced cardiomyopathy (HCC)   . H/O: hypothyroidism   . Class 3 severe obesity with serious comorbidity and body mass index (BMI) of 40.0 to 44.9 in adult [facility])   . Type 2 diabetes mellitus with diabetic polyneuropathy, with long-term current use of insulin (HCC)   . Arthritis   . Dyslipidemia   . Atrial fibrillation  10/25/2013   Past Medical History:  Diagnosis Date  . Arthritis    "all over" (06/30/2017)  . Chicken pox   . Dyslipidemia   . Dysrhythmia ablation 2018   a-fib  . GERD (gastroesophageal reflux disease)   . H/O: hypothyroidism    a. thyroid biopsy 1996 with synthroid. Stopped taking rx and was loss to follow up b. normal thyroid function 10/20/13  . High cholesterol   . Hx of cardiovascular stress test    ETT/Lexiscan Myoview (12/2013):  No ischemia; not gated; low risk.  Marland Kitchen Hypertension   . Obesity   . Persistent atrial fibrillation (HCC)    a diagnosed 10/25/2013  . Psoriasis    on Skyrizi  . Seasonal allergies   .  Stroke [facility]) 2016   "some speech problems since" (06/30/2017)  . 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 2/2 Afib with RVR for unknown duration (EF: 40-45%)  . Thyroid disease   . Type II diabetes mellitus (HCC)    a. hg A1c 10, newly diagnosed (10/26/13)    Family History  Adopted: Yes  Problem Relation Age of Onset  . Hypertension Mother   . Hyperlipidemia Mother     Past Surgical History:  Procedure Laterality Date  . ATRIAL FIBRILLATION ABLATION  06/30/2017  . ATRIAL FIBRILLATION ABLATION N/A 06/30/2017   Procedure: Atrial Fibrillation Ablation;  Surgeon: Hillis Range, MD;  [facility] INVASIVE CV LAB;  Service: Cardiovascular;  Laterality: N/A;  . BICEPT TENODESIS Right  05/24/2020   Procedure: BICEPS TENODESIS;  Surgeon: Cammy Copa, MD;  Location:  SURGERY CENTER;  Service: Orthopedics;  Laterality: Right;  . BIOPSY THYROID  1996  . CARDIOVERSION N/A 12/07/2013   Procedure: CARDIOVERSION;  Surgeon: Everette Rank, MD;  Location: [facility] ENDOSCOPY;  Service: Cardiovascular;  Laterality: N/A;  . CARDIOVERSION N/A 03/10/2016   Procedure: CARDIOVERSION;  Surgeon: Vesta Mixer, MD;  Location[facility] ENDOSCOPY;  Service: Cardiovascular;  Laterality: N/A;  . ESOPHAGOGASTRODUODENOSCOPY N/A 12/07/2017   Procedure: ESOPHAGOGASTRODUODENOSCOPY (EGD);  Surgeon: Hilarie Fredrickson, MD;  Location[facility] ENDOSCOPY;  Service: Endoscopy;  Laterality: N/A;  . EYE MUSCLE SURGERY Right ~ 1966  . SHOULDER ARTHROSCOPY WITH ROTATOR CUFF REPAIR Right 05/24/2020   Procedure: right shoulder arthroscopy, rotator cuff tear repair biceps tenodesis;  Surgeon: Cammy Copa, MD;  Location:  SURGERY CENTER;  Service: Orthopedics;  Laterality: Right;  . TEE WITHOUT CARDIOVERSION N/A 06/29/2017   Procedure: TRANSESOPHAGEAL ECHOCARDIOGRAM (TEE);  Surgeon: Pricilla Riffle, MD;  Location: [facility] ENDOSCOPY;  Service: Cardiovascular;  Laterality: N/A;  . TONSILLECTOMY  1971   Social History   Occupational History  . Occupation: Designer, television/film set  Tobacco Use  . Smoking status: Former Smoker    Packs/day: 1.00    Years: 22.00    Pack years: 22.00    Types: Cigarettes    Quit date: 12/01/2010    Years since quitting: 9.7  . Smokeless tobacco: Never Used  Vaping Use  . Vaping Use: Never used  Substance and Sexual Activity  . Alcohol use: Yes    Comment: social  . Drug use: No  . Sexual activity: Not Currently

## 2020-09-17 ENCOUNTER — Encounter: Payer: Self-pay | Admitting: "Endocrinology

## 2020-09-17 ENCOUNTER — Ambulatory Visit (INDEPENDENT_AMBULATORY_CARE_PROVIDER_SITE_OTHER): Payer: 59 | Admitting: "Endocrinology

## 2020-09-17 ENCOUNTER — Other Ambulatory Visit: Payer: Self-pay

## 2020-09-17 VITALS — BP 130/88 | HR 68 | Ht 65.5 in | Wt 251.6 lb

## 2020-09-17 DIAGNOSIS — I10 Essential (primary) hypertension: Secondary | ICD-10-CM

## 2020-09-17 DIAGNOSIS — E1159 Type 2 diabetes mellitus with other circulatory complications: Secondary | ICD-10-CM

## 2020-09-17 DIAGNOSIS — E782 Mixed hyperlipidemia: Secondary | ICD-10-CM | POA: Diagnosis not present

## 2020-09-17 DIAGNOSIS — Z794 Long term (current) use of insulin: Secondary | ICD-10-CM

## 2020-09-17 DIAGNOSIS — E1142 Type 2 diabetes mellitus with diabetic polyneuropathy: Secondary | ICD-10-CM

## 2020-09-17 MED ORDER — LANTUS SOLOSTAR 100 UNIT/ML ~~LOC~~ SOPN: 80.0000 [IU] | PEN_INJECTOR | Freq: Every day | SUBCUTANEOUS | 2 refills | Status: DC

## 2020-09-17 MED FILL — OZEMPIC (1 MG/DOSE) 4 MG/3M: 4 | 28 days supply | Qty: 3 | Fill #4

## 2020-09-17 MED FILL — LANTUS SOLOSTAR 100 UNITS/M: 100 | 11 days supply | Qty: 15 | Fill #6

## 2020-09-17 NOTE — Progress Notes (Signed)
Endocrinology Consult Note       09/17/2020, 1:15 PM   Subjective:    Patient ID: Kelly Lang, female    DOB: 10-12-1961.  Kelly Lang is being seen in consultation for management of currently uncontrolled symptomatic diabetes requested by  Ladell Pier, MD.   Past Medical History:  Diagnosis Date  . Arthritis    "all over" (06/30/2017)  . Chicken pox   . Dyslipidemia   . Dysrhythmia ablation 2018   a-fib  . GERD (gastroesophageal reflux disease)   . H/O: hypothyroidism    a. thyroid biopsy 1996 with synthroid. Stopped taking rx and was loss to follow up b. normal thyroid function 10/20/13  . High cholesterol   . Hx of cardiovascular stress test    ETT/Lexiscan Myoview (12/2013):  No ischemia; not gated; low risk.  Marland Kitchen Hypertension   . Obesity   . Persistent atrial fibrillation ([place])    a diagnosed 10/25/2013  . Psoriasis    on Skyrizi  . Seasonal allergies   . Stroke [facility]) 2016   "some speech problems since" (06/30/2017)  . 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 2/2 Afib with RVR for unknown duration (EF: 40-45%)  . Thyroid disease   . Type II diabetes mellitus ([place])    a. hg A1c 10, newly diagnosed (10/26/13)    Past Surgical History:  Procedure Laterality Date  . ATRIAL FIBRILLATION ABLATION  06/30/2017  . ATRIAL FIBRILLATION ABLATION N/A 06/30/2017   Procedure: Atrial Fibrillation Ablation;  Surgeon: Thompson Grayer, MD;  Location: [place] [facility];  Service: Cardiovascular;  Laterality: N/A;  . BICEPT TENODESIS Right 05/24/2020   Procedure: BICEPS TENODESIS;  Surgeon: Meredith Pel, MD;  Location: [place];  Service: Orthopedics;  Laterality: Right;  . BIOPSY THYROID  1996  . CARDIOVERSION N/A 12/07/2013   Procedure: CARDIOVERSION;  Surgeon: Casandra Doffing, MD;  Location: [place];  Service: Cardiovascular;  Laterality: N/A;  . CARDIOVERSION N/A  03/10/2016   Procedure: CARDIOVERSION;  Surgeon: Thayer Headings, MD;  Location: [facility] ENDOSCOPY;  Service: Cardiovascular;  Laterality: N/A;  . ESOPHAGOGASTRODUODENOSCOPY N/A 12/07/2017   Procedure: ESOPHAGOGASTRODUODENOSCOPY (EGD);  Surgeon: Irene Shipper, MD;  Location: [facility] ENDOSCOPY;  Service: Endoscopy;  Laterality: N/A;  . EYE MUSCLE SURGERY Right ~ 1966  . SHOULDER ARTHROSCOPY WITH ROTATOR CUFF REPAIR Right 05/24/2020   Procedure: right shoulder arthroscopy, rotator cuff tear repair biceps tenodesis;  Surgeon: Meredith Pel, MD;  Location: [place];  Service: Orthopedics;  Laterality: Right;  . TEE WITHOUT CARDIOVERSION N/A 06/29/2017   Procedure: TRANSESOPHAGEAL ECHOCARDIOGRAM (TEE);  Surgeon: Fay Records, MD;  Location: [place];  Service: Cardiovascular;  Laterality: N/A;  . TONSILLECTOMY  1971    Social History   Socioeconomic History  . Marital status: Divorced    Spouse name: Not on file  . Number of children: 0  . Years of education: 16  . Highest education level: Not on file  Occupational History  . Occupation: Chief Technology Officer  Tobacco Use  . Smoking status: Former Smoker    Packs/day: 1.00    Years: 22.00  Pack years: 22.00    Types: Cigarettes    Quit date: 12/01/2010    Years since quitting: 9.8  . Smokeless tobacco: Never Used  Vaping Use  . Vaping Use: Never used  Substance and Sexual Activity  . Alcohol use: Yes    Comment: social  . Drug use: No  . Sexual activity: Not Currently  Other Topics Concern  . Not on file  Social History Narrative   Moved to [place] from [place] in 2002.     Fun: shop, sew, decorate   Denies any beliefs effecting healthcare   Social Determinants of Health   Financial Resource Strain:   . Difficulty of Paying Living Expenses: Not on file  Food Insecurity:   . Worried About Charity fundraiser in the Last Year: Not on file  . Ran Out of Food in the Last Year: Not on file  Transportation Needs:    . Lack of Transportation (Medical): Not on file  . Lack of Transportation (Non-Medical): Not on file  Physical Activity:   . Days of Exercise per Week: Not on file  . Minutes of Exercise per Session: Not on file  Stress:   . Feeling of Stress : Not on file  Social Connections:   . Frequency of Communication with Friends and Family: Not on file  . Frequency of Social Gatherings with Friends and Family: Not on file  . Attends Religious Services: Not on file  . Active Member of Clubs or Organizations: Not on file  . Attends Archivist Meetings: Not on file  . Marital Status: Not on file    Family History  Adopted: Yes  Problem Relation Age of Onset  . Hypertension Mother   . Hyperlipidemia Mother     Outpatient Encounter Medications as of 09/17/2020  Medication Sig  . atorvastatin (LIPITOR) 40 MG tablet TAKE 1 TABLET (40 MG TOTAL) BY MOUTH DAILY.  Marland Kitchen Blood Glucose Monitoring Suppl ([place]) w/Device KIT Use as instructed to check blood sugar up to 3 times daily.  . diclofenac sodium (VOLTAREN) 1 % GEL APPLY 2 GRAMS TOPICALLY 4 (FOUR) TIMES DAILY AS NEEDED (PAIN).  Marland Kitchen diclofenac Sodium (VOLTAREN) 1 % GEL Apply 2 g topically 4 (four) times daily.  . DULoxetine (CYMBALTA) 20 MG capsule TAKE 1 CAPSULE (20 MG TOTAL) BY MOUTH DAILY.  Marland Kitchen insulin glargine (LANTUS SOLOSTAR) 100 UNIT/ML Solostar Pen Inject 80 Units into the skin at bedtime.  Marland Kitchen lisinopril (ZESTRIL) 10 MG tablet Take 1 tablet (10 mg total) by mouth daily.  . metFORMIN (GLUCOPHAGE) 1000 MG tablet TAKE 1 TABLET (1,000 MG TOTAL) BY MOUTH 2 (TWO) TIMES DAILY WITH A MEAL.  . metoprolol tartrate (LOPRESSOR) 25 MG tablet Take 1.5 tablets (37.5 mg total) by mouth 2 (two) times daily. Please make yearly appt with Dr. Acie Fredrickson for November for future refills. 1st attempt  . metroNIDAZOLE (METROCREAM) 0.75 % cream APPLY THIN LAYER TO FACE TWICE DAILY  . ONETOUCH DELICA LANCETS 11H MISC Use as instructed to check  blood sugar up to 3 times daily.  Glory Rosebush VERIO test strip USE AS DIRECTED UP TO THREE TIMES DAILY TO  TEST  BLOOD  SUGAR  . OZEMPIC, 1 MG/DOSE, 4 MG/3ML SOPN INJECT 1 MG INTO THE SKIN ONCE A WEEK  . SKYRIZI, 150 MG DOSE, 75 MG/0.83ML PSKT Inject 150 mg as directed See admin instructions. 4x's per year  . TRUEPLUS PEN NEEDLES 32G X 4 MM MISC USE AS DIRECTED  AT BEDTIME.  Marland Kitchen XARELTO 20 MG TABS tablet TAKE 1 TABLET BY MOUTH DAILY WITH SUPPER.  . [DISCONTINUED] betamethasone dipropionate 0.05 % cream APPLY 1 APPLICATION OF CREAM TWICE DAILY AS NEEDED FOR FLARES  . [DISCONTINUED] glipiZIDE (GLUCOTROL) 5 MG tablet Take 0.5 tablets (2.5 mg total) by mouth daily before breakfast.  . [DISCONTINUED] insulin glargine (LANTUS SOLOSTAR) 100 UNIT/ML Solostar Pen Inject 155 Units into the skin every morning.  . [DISCONTINUED] mupirocin ointment (BACTROBAN) 2 % Apply once a day to the affected area.  . [DISCONTINUED] Semaglutide, 1 MG/DOSE, (OZEMPIC, 1 MG/DOSE,) 2 MG/1.5ML SOPN Inject 1 mg into the skin once a week.   No facility-administered encounter medications on file as of 09/17/2020.    ALLERGIES: Allergies  Allergen Reactions  . Jardiance [Empagliflozin] Other (See Comments)    Yeast infections  . Codeine Itching and Rash    VACCINATION STATUS: Immunization History  Administered Date(s) Administered  . Influenza,inj,Quad PF,6+ Mos 11/19/2016, 09/07/2017, 08/09/2018, 08/16/2020  . Moderna SARS-COVID-2 Vaccination 01/04/2020, 02/01/2020  . Pneumococcal Polysaccharide-23 06/16/2016  . Tdap 06/16/2016    Diabetes She presents for her initial diabetic visit. She has type 2 diabetes mellitus. Onset time: She was diagnosed at approximate age of 89 years. Her disease course has been worsening. There are no hypoglycemic associated symptoms. Pertinent negatives for hypoglycemia include no confusion, headaches, pallor or seizures. Associated symptoms include fatigue, polydipsia and polyuria. Pertinent  negatives for diabetes include no chest pain and no polyphagia. There are no hypoglycemic complications. Symptoms are worsening. Diabetic complications include a CVA. Risk factors for coronary artery disease include dyslipidemia, diabetes mellitus, obesity, hypertension, post-menopausal, sedentary lifestyle and tobacco exposure. Current diabetic treatment includes insulin injections (She is currently on Levemir 155 units daily, Ozempic 1 mg weekly, glipizide 2.5 mg daily, Metformin 1000 mg p.o. twice daily.). Her weight is fluctuating minimally. She is following a generally unhealthy diet. When asked about meal planning, she reported none. She has had a previous visit with a dietitian. She participates in exercise intermittently. Her home blood glucose trend is fluctuating minimally. Her breakfast blood glucose range is generally >200 mg/dl. Her overall blood glucose range is >200 mg/dl. (Her recent fasting blood glucose profile ranges between 160-255.  This is off of her meter.  Her point-of-care A1c was 8.5%, slightly improving from 9.1%.) An ACE inhibitor/angiotensin II receptor blocker is being taken. Eye exam is current.  Hypertension This is a chronic problem. The current episode started more than 1 year ago. The problem is controlled. Pertinent negatives include no chest pain, headaches, palpitations or shortness of breath. Risk factors for coronary artery disease include diabetes mellitus, dyslipidemia, obesity, sedentary lifestyle, smoking/tobacco exposure and post-menopausal state. Past treatments include ACE inhibitors and beta blockers. Hypertensive end-organ damage includes CVA.  Hyperlipidemia This is a chronic problem. The current episode started more than 1 year ago. The problem is uncontrolled. Exacerbating diseases include diabetes and obesity. Pertinent negatives include no chest pain, myalgias or shortness of breath. Risk factors for coronary artery disease include dyslipidemia, diabetes  mellitus, hypertension, obesity, a sedentary lifestyle and post-menopausal.     Review of Systems  Constitutional: Positive for fatigue. Negative for chills, fever and unexpected weight change.  HENT: Negative for trouble swallowing and voice change.   Eyes: Negative for visual disturbance.  Respiratory: Negative for cough, shortness of breath and wheezing.   Cardiovascular: Negative for chest pain, palpitations and leg swelling.  Gastrointestinal: Negative for diarrhea, nausea and vomiting.  Endocrine: Positive for polydipsia and  polyuria. Negative for cold intolerance, heat intolerance and polyphagia.  Musculoskeletal: Negative for arthralgias and myalgias.  Skin: Negative for color change, pallor, rash and wound.  Neurological: Negative for seizures and headaches.  Psychiatric/Behavioral: Negative for confusion and suicidal ideas.    Objective:    Vitals with BMI 09/17/2020 08/16/2020 08/16/2020  Height 5' 5.5" - 5' 5.5"  Weight 251 lbs 10 oz - 255 lbs 3 oz  BMI 05.69 - 79.48  Systolic 016 553 748  Diastolic 88 85 85  Pulse 68 - 86    BP 130/88   Pulse 68   Ht 5' 5.5" (1.664 m)   Wt 251 lb 9.6 oz (114.1 kg)   LMP 04/15/2015 (LMP Unknown) Comment: last in may  BMI 41.23 kg/m   Wt Readings from Last 3 Encounters:  09/17/20 251 lb 9.6 oz (114.1 kg)  08/16/20 255 lb 3.2 oz (115.8 kg)  08/03/20 249 lb (112.9 kg)     Physical Exam Constitutional:      Appearance: She is well-developed.  HENT:     Head: Normocephalic and atraumatic.  Neck:     Thyroid: No thyromegaly.     Trachea: No tracheal deviation.  Cardiovascular:     Rate and Rhythm: Normal rate and regular rhythm.  Pulmonary:     Effort: Pulmonary effort is normal.  Abdominal:     Tenderness: There is no abdominal tenderness. There is no guarding.  Musculoskeletal:        General: Normal range of motion.     Cervical back: Normal range of motion and neck supple.  Skin:    General: Skin is warm and dry.      Coloration: Skin is not pale.     Findings: No erythema or rash.  Neurological:     Mental Status: She is alert and oriented to person, place, and time.     Cranial Nerves: No cranial nerve deficit.     Coordination: Coordination normal.     Deep Tendon Reflexes: Reflexes are normal and symmetric.     Comments: Diminished monofilament test sensation on bilateral lower extremities. Her dorsalis pedis and posterior tibial arterial pulses are normal.  Psychiatric:        Judgment: Judgment normal.       CMP ( most recent) CMP     Component Value Date/Time   NA 139 05/21/2020 0900   NA 138 04/05/2020 0932   K 3.9 05/21/2020 0900   CL 104 05/21/2020 0900   CO2 25 05/21/2020 0900   GLUCOSE 155 (H) 05/21/2020 0900   BUN 14 05/21/2020 0900   BUN 15 04/05/2020 0932   CREATININE 0.74 05/21/2020 0900   CREATININE 0.90 06/16/2016 1128   CALCIUM 9.2 05/21/2020 0900   PROT 6.9 08/16/2020 0941   ALBUMIN 4.0 08/16/2020 0941   AST 12 08/16/2020 0941   ALT 21 08/16/2020 0941   ALKPHOS 82 08/16/2020 0941   BILITOT 0.3 08/16/2020 0941   GFRNONAA >60 05/21/2020 0900   GFRNONAA 73 06/16/2016 1128   GFRAA >60 05/21/2020 0900   GFRAA 84 06/16/2016 1128     Diabetic Labs (most recent): Lab Results  Component Value Date   HGBA1C 8.5 (A) 06/20/2020   HGBA1C 9.1 (A) 04/18/2020   HGBA1C 8.5 (A) 02/15/2020     Lipid Panel ( most recent) Lipid Panel     Component Value Date/Time   CHOL 140 08/16/2020 0941   TRIG 162 (H) 08/16/2020 0941   HDL 36 (L) 08/16/2020 2707  CHOLHDL 3.9 08/16/2020 0941   CHOLHDL 5.2 (H) 11/19/2016 1103   VLDL 59 (H) 11/19/2016 1103   LDLCALC 76 08/16/2020 0941   LDLDIRECT 114.0 02/13/2015 0750   LABVLDL 28 08/16/2020 0941      Lab Results  Component Value Date   TSH 1.01 09/16/2019   TSH 0.718 12/26/2015   TSH 0.950 09/15/2015   TSH 0.95 11/13/2014   TSH 1.020 10/25/2013   FREET4 0.98 12/26/2015   FREET4 0.98 10/25/2013      Assessment &  Plan:   1. DM type 2 causing vascular disease ([place]) - Kelly Lang has currently uncontrolled symptomatic type 2 DM since  58 years of age,  with most recent A1c of 8.5 %. Recent labs reviewed. Her recent fasting blood glucose profile ranges between 160-255.  This is off of her meter.  Her point-of-care A1c was 8.5%, slightly improving from 9.1%.  - I had a long discussion with her about the progressive nature of diabetes and the pathology behind its complications. -her diabetes is complicated by CVA, peripheral neuropathy and she remains at a high risk for more acute and chronic complications which include CAD, CVA, CKD, retinopathy, and neuropathy. These are all discussed in detail with her.  - I have counseled her on diet  and weight management  by adopting a carbohydrate restricted/protein rich diet. Patient is encouraged to switch to  unprocessed or minimally processed     complex starch and increased protein intake (animal or plant source), fruits, and vegetables. -  she is advised to stick to a routine mealtimes to eat 3 meals  a day and avoid unnecessary snacks ( to snack only to correct hypoglycemia).   - she admits that there is a room for improvement in her food and drink choices. - Suggestion is made for her to avoid simple carbohydrates  from her diet including Cakes, Sweet Desserts, Ice Cream, Soda (diet and regular), Sweet Tea, Candies, Chips, Cookies, Store Bought Juices, Alcohol in Excess of  1-2 drinks a day, Artificial Sweeteners,  Coffee Creamer, and "Sugar-free" Products. This will help patient to have more stable blood glucose profile and potentially avoid unintended weight gain.  - she reports that she has had enough encounters with dietitian in the past.    - I have approached her with the following individualized plan to manage  her diabetes and patient agrees:   - she is on excessive dose of basal insulin, advised to lower her Lantus to 80 units nightly, advised to  start strict  monitoring of glucose 4 times a day-before meals and at bedtime, and return in 1 week with her meter and logs for reevaluation. - she is warned not to take insulin without proper monitoring per orders.  - she is encouraged to call clinic for blood glucose levels less than 70 or above 200 mg /dl. - she is advised to continue Metformin 1000 mg p.o. twice daily, therapeutically suitable for patient . -She will also continue to benefit from [place].  She is advised to continue Ozempic 1 mg subcutaneously weekly. -She is advised to discontinue glipizide at this time. -She will be considered for prandial insulin if she presents with uncontrolled postprandial hyperglycemia.   - Specific targets for  A1c;  LDL, HDL,  and Triglycerides were discussed with the patient.  2) Blood Pressure /Hypertension:  her blood pressure is  controlled to target.   she is advised to continue her current medications including lisinopril 10 mg p.o. daily, metoprolol  25 mg p.o. twice daily. 3) Lipids/Hyperlipidemia:   Review of her recent lipid panel showed  uncontrolled  LDL at 76-114 .  she  is advised to continue   atorvastatin 40 mg daily at bedtime.  Side effects and precautions discussed with her.  4)  Weight/Diet:  Body mass index is 41.23 kg/m.  -   clearly complicating her diabetes care.   she is  a candidate for weight loss. I discussed with her the fact that loss of 5 - 10% of her  current body weight will have the most impact on her diabetes management.  Exercise, and detailed carbohydrates information provided  -  detailed on discharge instructions. She would benefit from bariatric weight management which is briefly discussed with her.  This procedure will be further discussed on subsequent visits.  5) Chronic Care/Health Maintenance:  -she  is on ACEI/ARB and Statin medications and  is encouraged to initiate and continue to follow up with Ophthalmology, Dentist,  Podiatrist at least yearly or  according to recommendations, and advised to   stay away from smoking. I have recommended yearly flu vaccine and pneumonia vaccine at least every 5 years; moderate intensity exercise for up to 150 minutes weekly; and  sleep for at least 7 hours a day.  - she is  advised to maintain close follow up with Ladell Pier, MD for primary care needs, as well as her other providers for optimal and coordinated care.   - Time spent in this patient care: 60 min, of which > 50% was spent in  counseling  her about her currently uncontrolled, complicated type 2 diabetes; hyperlipidemia, hypertension and the rest reviewing her blood glucose logs , discussing her hypoglycemia and hyperglycemia episodes, reviewing her current and  previous labs / studies  ( including abstraction from other facilities) and medications  doses and developing a  long term treatment plan based on the latest standards of care/ guidelines; and documenting her care.    Please refer to Patient Instructions for Blood Glucose Monitoring and Insulin/Medications Dosing Guide"  in media tab for additional information. Please  also refer to " Patient Self Inventory" in the Media  tab for reviewed elements of pertinent patient history.  Kelly Lang participated in the discussions, expressed understanding, and voiced agreement with the above plans.  All questions were answered to her satisfaction. she is encouraged to contact clinic should she have any questions or concerns prior to her return visit.   Follow up plan: - Return in about 1 week (around 09/24/2020) for F/U with Meter and Logs Only - no Labs, ABI in Office NV, A1c -NV, Urine MA - NV.  Glade Lloyd, MD [facility] [address] [place], [place] [address] Phone: [phone]  Fax: [phone]    09/17/2020, 1:15 PM  This note was partially dictated with voice recognition software. Similar sounding words can be transcribed  inadequately or may not  be corrected upon review.

## 2020-09-17 NOTE — Patient Instructions (Signed)

## 2020-09-24 ENCOUNTER — Ambulatory Visit (INDEPENDENT_AMBULATORY_CARE_PROVIDER_SITE_OTHER): Payer: 59 | Admitting: "Endocrinology

## 2020-09-24 ENCOUNTER — Other Ambulatory Visit: Payer: Self-pay

## 2020-09-24 ENCOUNTER — Other Ambulatory Visit: Payer: Self-pay | Admitting: "Endocrinology

## 2020-09-24 ENCOUNTER — Encounter: Payer: Self-pay | Admitting: "Endocrinology

## 2020-09-24 VITALS — BP 110/64 | HR 68 | Ht 65.5 in | Wt 246.2 lb

## 2020-09-24 DIAGNOSIS — I10 Essential (primary) hypertension: Secondary | ICD-10-CM

## 2020-09-24 DIAGNOSIS — E1159 Type 2 diabetes mellitus with other circulatory complications: Secondary | ICD-10-CM | POA: Diagnosis not present

## 2020-09-24 DIAGNOSIS — E782 Mixed hyperlipidemia: Secondary | ICD-10-CM

## 2020-09-24 LAB — POCT GLYCOSYLATED HEMOGLOBIN (HGB A1C): Hemoglobin A1C: 8.3 % — AB (ref 4.0–5.6)

## 2020-09-24 LAB — POCT UA - MICROALBUMIN
Creatinine, POC: 300 mg/dL
Microalbumin Ur, POC: 150 mg/L

## 2020-09-24 MED ORDER — GLIPIZIDE ER 5 MG PO TB24: 5.0000 mg | ORAL_TABLET | Freq: Every day | ORAL | 1 refills | Status: DC

## 2020-09-24 MED FILL — glipiZIDE XL 5 MG TB24: 5 | 90 days supply | Qty: 90 | Fill #0

## 2020-09-24 NOTE — Progress Notes (Signed)
09/24/2020, 4:28 PM  Endocrinology follow-up note   Subjective:    Patient ID: Kelly Lang, female    DOB: December 26, 1960.  Kelly Lang is being seen in follow-up after she was seen in consultation for management of currently uncontrolled symptomatic diabetes requested by  Ladell Pier, MD.   Past Medical History:  Diagnosis Date  . Arthritis    "all over" (06/30/2017)  . Chicken pox   . Dyslipidemia   . Dysrhythmia ablation 2018   a-fib  . GERD (gastroesophageal reflux disease)   . H/O: hypothyroidism    a. thyroid biopsy 1996 with synthroid. Stopped taking rx and was loss to follow up b. normal thyroid function 10/20/13  . High cholesterol   . Hx of cardiovascular stress test    ETT/Lexiscan Myoview (12/2013):  No ischemia; not gated; low risk.  Marland Kitchen Hypertension   . Obesity   . Persistent atrial fibrillation ([place])    a diagnosed 10/25/2013  . Psoriasis    on Skyrizi  . Seasonal allergies   . Stroke [facility]) 2016   "some speech problems since" (06/30/2017)  . 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 2/2 Afib with RVR for unknown duration (EF: 40-45%)  . Thyroid disease   . Type II diabetes mellitus ([place])    a. hg A1c 10, newly diagnosed (10/26/13)    Past Surgical History:  Procedure Laterality Date  . ATRIAL FIBRILLATION ABLATION  06/30/2017  . ATRIAL FIBRILLATION ABLATION N/A 06/30/2017   Procedure: Atrial Fibrillation Ablation;  Surgeon: Thompson Grayer, MD;  Location: [place] [facility];  Service: Cardiovascular;  Laterality: N/A;  . BICEPT TENODESIS Right 05/24/2020   Procedure: BICEPS TENODESIS;  Surgeon: Meredith Pel, MD;  Location: [place];  Service: Orthopedics;  Laterality: Right;  . BIOPSY THYROID  1996  . CARDIOVERSION N/A 12/07/2013   Procedure: CARDIOVERSION;  Surgeon: Casandra Doffing, MD;  Location: [place];  Service: Cardiovascular;   Laterality: N/A;  . CARDIOVERSION N/A 03/10/2016   Procedure: CARDIOVERSION;  Surgeon: Thayer Headings, MD;  Location: [facility] ENDOSCOPY;  Service: Cardiovascular;  Laterality: N/A;  . ESOPHAGOGASTRODUODENOSCOPY N/A 12/07/2017   Procedure: ESOPHAGOGASTRODUODENOSCOPY (EGD);  Surgeon: Irene Shipper, MD;  Location: [facility] END[facility]COPY;  Service: Endoscopy;  Laterality: N/A;  . EYE MUSCLE SURGERY Right ~ 1966  . SHOULDER ARTHROSCOPY WITH ROTATOR CUFF REPAIR Right 05/24/2020   Procedure: right shoulder arthroscopy, rotator cuff tear repair biceps tenodesis;  Surgeon: Meredith Pel, MD;  Location: [place];  Service: Orthopedics;  Laterality: Right;  . TEE WITHOUT CARDIOVERSION N/A 06/29/2017   Procedure: TRANSESOPHAGEAL ECHOCARDIOGRAM (TEE);  Surgeon: Fay Records, MD;  Location: [place];  Service: Cardiovascular;  Laterality: N/A;  . TONSILLECTOMY  1971    Social History   Socioeconomic History  . Marital status: Divorced    Spouse name: Not on file  . Number of children: 0  . Years of education: 60  . Highest education level: Not on file  Occupational History  . Occupation: Chief Technology Officer  Tobacco Use  . Smoking status: Former Smoker    Packs/day: 1.00  Years: 22.00    Pack years: 22.00    Types: Cigarettes    Quit date: 12/01/2010    Years since quitting: 9.8  . Smokeless tobacco: Never Used  Vaping Use  . Vaping Use: Never used  Substance and Sexual Activity  . Alcohol use: Yes    Comment: social  . Drug use: No  . Sexual activity: Not Currently  Other Topics Concern  . Not on file  Social History Narrative   Moved to [place] from [place] in 2002.     Fun: shop, sew, decorate   Denies any beliefs effecting healthcare   Social Determinants of Health   Financial Resource Strain:   . Difficulty of Paying Living Expenses: Not on file  Food Insecurity:   . Worried About Charity fundraiser in the Last Year: Not on file  . Ran Out of Food in the Last  Year: Not on file  Transportation Needs:   . Lack of Transportation (Medical): Not on file  . Lack of Transportation (Non-Medical): Not on file  Physical Activity:   . Days of Exercise per Week: Not on file  . Minutes of Exercise per Session: Not on file  Stress:   . Feeling of Stress : Not on file  Social Connections:   . Frequency of Communication with Friends and Family: Not on file  . Frequency of Social Gatherings with Friends and Family: Not on file  . Attends Religious Services: Not on file  . Active Member of Clubs or Organizations: Not on file  . Attends Archivist Meetings: Not on file  . Marital Status: Not on file    Family History  Adopted: Yes  Problem Relation Age of Onset  . Hypertension Mother   . Hyperlipidemia Mother     Outpatient Encounter Medications as of 09/24/2020  Medication Sig  . atorvastatin (LIPITOR) 40 MG tablet TAKE 1 TABLET (40 MG TOTAL) BY MOUTH DAILY.  Marland Kitchen Blood Glucose Monitoring Suppl ([place]) w/Device KIT Use as instructed to check blood sugar up to 3 times daily.  . diclofenac sodium (VOLTAREN) 1 % GEL APPLY 2 GRAMS TOPICALLY 4 (FOUR) TIMES DAILY AS NEEDED (PAIN).  . DULoxetine (CYMBALTA) 20 MG capsule TAKE 1 CAPSULE (20 MG TOTAL) BY MOUTH DAILY.  Marland Kitchen glipiZIDE (GLUCOTROL XL) 5 MG 24 hr tablet Take 1 tablet (5 mg total) by mouth daily with breakfast.  . insulin glargine (LANTUS SOLOSTAR) 100 UNIT/ML Solostar Pen Inject 80 Units into the skin at bedtime.  Marland Kitchen lisinopril (ZESTRIL) 10 MG tablet Take 1 tablet (10 mg total) by mouth daily.  . metFORMIN (GLUCOPHAGE) 1000 MG tablet TAKE 1 TABLET (1,000 MG TOTAL) BY MOUTH 2 (TWO) TIMES DAILY WITH A MEAL.  . metoprolol tartrate (LOPRESSOR) 25 MG tablet Take 1.5 tablets (37.5 mg total) by mouth 2 (two) times daily. Please make yearly appt with Dr. Acie Fredrickson for November for future refills. 1st attempt  . metroNIDAZOLE (METROCREAM) 0.75 % cream APPLY THIN LAYER TO FACE TWICE DAILY   . ONETOUCH DELICA LANCETS 70V MISC Use as instructed to check blood sugar up to 3 times daily.  Glory Rosebush VERIO test strip USE AS DIRECTED UP TO THREE TIMES DAILY TO  TEST  BLOOD  SUGAR  . OZEMPIC, 1 MG/DOSE, 4 MG/3ML SOPN INJECT 1 MG INTO THE SKIN ONCE A WEEK  . SKYRIZI, 150 MG DOSE, 75 MG/0.83ML PSKT Inject 150 mg as directed See admin instructions. 4x's per year  . TRUEPLUS  PEN NEEDLES 32G X 4 MM MISC USE AS DIRECTED AT BEDTIME.  Marland Kitchen XARELTO 20 MG TABS tablet TAKE 1 TABLET BY MOUTH DAILY WITH SUPPER.  . [DISCONTINUED] diclofenac Sodium (VOLTAREN) 1 % GEL Apply 2 g topically 4 (four) times daily.   No facility-administered encounter medications on file as of 09/24/2020.    ALLERGIES: Allergies  Allergen Reactions  . Jardiance [Empagliflozin] Other (See Comments)    Yeast infections  . Codeine Itching and Rash    VACCINATION STATUS: Immunization History  Administered Date(s) Administered  . Influenza,inj,Quad PF,6+ Mos 11/19/2016, 09/07/2017, 08/09/2018, 08/16/2020  . Moderna SARS-COVID-2 Vaccination 01/04/2020, 02/01/2020  . Pneumococcal Polysaccharide-23 06/16/2016  . Tdap 06/16/2016    Diabetes She presents for her follow-up diabetic visit. She has type 2 diabetes mellitus. Onset time: She was diagnosed at approximate age of 8 years. Her disease course has been improving. There are no hypoglycemic associated symptoms. Pertinent negatives for hypoglycemia include no confusion, headaches, pallor or seizures. Associated symptoms include fatigue. Pertinent negatives for diabetes include no chest pain, no polydipsia, no polyphagia and no polyuria. There are no hypoglycemic complications. Symptoms are improving. Diabetic complications include a CVA. Risk factors for coronary artery disease include dyslipidemia, diabetes mellitus, obesity, hypertension, post-menopausal, sedentary lifestyle and tobacco exposure. Current diabetic treatment includes insulin injections (She is currently on  Levemir 155 units daily, Ozempic 1 mg weekly, glipizide 2.5 mg daily, Metformin 1000 mg p.o. twice daily.). Her weight is fluctuating minimally. She is following a generally unhealthy diet. When asked about meal planning, she reported none. She has had a previous visit with a dietitian. She participates in exercise intermittently. Her home blood glucose trend is decreasing steadily. Her breakfast blood glucose range is generally 140-180 mg/dl. Her lunch blood glucose range is generally 140-180 mg/dl. Her dinner blood glucose range is generally 140-180 mg/dl. Her bedtime blood glucose range is generally 140-180 mg/dl. Her overall blood glucose range is 140-180 mg/dl. (She presents with significantly improved glycemic profile both fasting and postprandial.  Her point-of-care A1c is 8.3% overall improving from 9.1%.  No documented or reported hypoglycemia.  ) An ACE inhibitor/angiotensin II receptor blocker is being taken. Eye exam is current.  Hypertension This is a chronic problem. The current episode started more than 1 year ago. The problem is controlled. Pertinent negatives include no chest pain, headaches, palpitations or shortness of breath. Risk factors for coronary artery disease include diabetes mellitus, dyslipidemia, obesity, sedentary lifestyle, smoking/tobacco exposure and post-menopausal state. Past treatments include ACE inhibitors and beta blockers. Hypertensive end-organ damage includes CVA.  Hyperlipidemia This is a chronic problem. The current episode started more than 1 year ago. The problem is uncontrolled. Exacerbating diseases include diabetes and obesity. Pertinent negatives include no chest pain, myalgias or shortness of breath. Risk factors for coronary artery disease include dyslipidemia, diabetes mellitus, hypertension, obesity, a sedentary lifestyle and post-menopausal.     Review of Systems  Constitutional: Positive for fatigue. Negative for chills, fever and unexpected weight  change.  HENT: Negative for trouble swallowing and voice change.   Eyes: Negative for visual disturbance.  Respiratory: Negative for cough, shortness of breath and wheezing.   Cardiovascular: Negative for chest pain, palpitations and leg swelling.  Gastrointestinal: Negative for diarrhea, nausea and vomiting.  Endocrine: Negative for cold intolerance, heat intolerance, polydipsia, polyphagia and polyuria.  Musculoskeletal: Negative for arthralgias and myalgias.  Skin: Negative for color change, pallor, rash and wound.  Neurological: Negative for seizures and headaches.  Psychiatric/Behavioral: Negative for confusion and  suicidal ideas.    Objective:    Vitals with BMI 09/24/2020 09/17/2020 08/16/2020  Height 5' 5.5" 5' 5.5" -  Weight 246 lbs 3 oz 251 lbs 10 oz -  BMI 92.01 00.71 -  Systolic 219 758 832  Diastolic 64 88 85  Pulse 68 68 -    BP 110/64   Pulse 68   Ht 5' 5.5" (1.664 m)   Wt 246 lb 3.2 oz (111.7 kg)   LMP 04/15/2015 (LMP Unknown) Comment: last in may  BMI 40.35 kg/m   Wt Readings from Last 3 Encounters:  09/24/20 246 lb 3.2 oz (111.7 kg)  09/17/20 251 lb 9.6 oz (114.1 kg)  08/16/20 255 lb 3.2 oz (115.8 kg)     Physical Exam Constitutional:      Appearance: She is well-developed.  HENT:     Head: Normocephalic and atraumatic.  Neck:     Thyroid: No thyromegaly.     Trachea: No tracheal deviation.  Cardiovascular:     Rate and Rhythm: Normal rate and regular rhythm.  Pulmonary:     Effort: Pulmonary effort is normal.  Abdominal:     Tenderness: There is no abdominal tenderness. There is no guarding.  Musculoskeletal:        General: Normal range of motion.     Cervical back: Normal range of motion and neck supple.  Skin:    General: Skin is warm and dry.     Coloration: Skin is not pale.     Findings: No erythema or rash.  Neurological:     Mental Status: She is alert and oriented to person, place, and time.     Cranial Nerves: No cranial nerve  deficit.     Coordination: Coordination normal.     Deep Tendon Reflexes: Reflexes are normal and symmetric.     Comments: Diminished monofilament test sensation on bilateral lower extremities. Her dorsalis pedis and posterior tibial arterial pulses are normal.  Psychiatric:        Judgment: Judgment normal.       CMP ( most recent) CMP     Component Value Date/Time   NA 139 05/21/2020 0900   NA 138 04/05/2020 0932   K 3.9 05/21/2020 0900   CL 104 05/21/2020 0900   CO2 25 05/21/2020 0900   GLUCOSE 155 (H) 05/21/2020 0900   BUN 14 05/21/2020 0900   BUN 15 04/05/2020 0932   CREATININE 0.74 05/21/2020 0900   CREATININE 0.90 06/16/2016 1128   CALCIUM 9.2 05/21/2020 0900   PROT 6.9 08/16/2020 0941   ALBUMIN 4.0 08/16/2020 0941   AST 12 08/16/2020 0941   ALT 21 08/16/2020 0941   ALKPHOS 82 08/16/2020 0941   BILITOT 0.3 08/16/2020 0941   GFRNONAA >60 05/21/2020 0900   GFRNONAA 73 06/16/2016 1128   GFRAA >60 05/21/2020 0900   GFRAA 84 06/16/2016 1128     Diabetic Labs (most recent): Lab Results  Component Value Date   HGBA1C 8.3 (A) 09/24/2020   HGBA1C 8.5 (A) 06/20/2020   HGBA1C 9.1 (A) 04/18/2020     Lipid Panel ( most recent) Lipid Panel     Component Value Date/Time   CHOL 140 08/16/2020 0941   TRIG 162 (H) 08/16/2020 0941   HDL 36 (L) 08/16/2020 0941   CHOLHDL 3.9 08/16/2020 0941   CHOLHDL 5.2 (H) 11/19/2016 1103   VLDL 59 (H) 11/19/2016 1103   LDLCALC 76 08/16/2020 0941   LDLDIRECT 114.0 02/13/2015 0750   LABVLDL 28 08/16/2020 0941  Lab Results  Component Value Date   TSH 1.01 09/16/2019   TSH 0.718 12/26/2015   TSH 0.950 09/15/2015   TSH 0.95 11/13/2014   TSH 1.020 10/25/2013   FREET4 0.98 12/26/2015   FREET4 0.98 10/25/2013      Assessment & Plan:   1. DM type 2 causing vascular disease ([place]) - Coletta Lockner has currently uncontrolled symptomatic type 2 DM since  59 years of age.  She presents with significantly improved glycemic  profile both fasting and postprandial.  Her point-of-care A1c is 8.3% overall improving from 9.1%.  No documented or reported hypoglycemia.   - I had a long discussion with her about the progressive nature of diabetes and the pathology behind its complications. -her diabetes is complicated by CVA, peripheral neuropathy and she remains at a high risk for more acute and chronic complications which include CAD, CVA, CKD, retinopathy, and neuropathy. These are all discussed in detail with her.  - I have counseled her on diet  and weight management  by adopting a carbohydrate restricted/protein rich diet. Patient is encouraged to switch to  unprocessed or minimally processed     complex starch and increased protein intake (animal or plant source), fruits, and vegetables. -  she is advised to stick to a routine mealtimes to eat 3 meals  a day and avoid unnecessary snacks ( to snack only to correct hypoglycemia).   - she  admits there is a room for improvement in her diet and drink choices. -  Suggestion is made for her to avoid simple carbohydrates  from her diet including Cakes, Sweet Desserts / Pastries, Ice Cream, Soda (diet and regular), Sweet Tea, Candies, Chips, Cookies, Sweet Pastries,  Store Bought Juices, Alcohol in Excess of  1-2 drinks a day, Artificial Sweeteners, Coffee Creamer, and "Sugar-free" Products. This will help patient to have stable blood glucose profile and potentially avoid unintended weight gain.  - she reports that she has had enough encounters with dietitian in the past.    - I have approached her with the following individualized plan to manage  her diabetes and patient agrees:   - she is doing better with the lower dose of insulin.  She is advised to continue Lantus 80 units nightly, continue to monitor blood glucose twice a day-daily before breakfast and at bedtime.   -She will not need prandial insulin for now.  - she is warned not to take insulin without proper monitoring  per orders.  - she is encouraged to call clinic for blood glucose levels less than 70 or above 200 mg /dl. - she is advised to continue Metformin 1000 mg p.o. twice daily , therapeutically suitable for patient . -She will also continue to benefit from Pierce.  She is advised to continue Ozempic 1 mg subcutaneously weekly. -She will be continued on glipizide 5 mg XL p.o. daily at breakfast in lieu of prandial insulin.  - Specific targets for  A1c;  LDL, HDL,  and Triglycerides were discussed with the patient.  2) Blood Pressure /Hypertension: Her blood pressure is controlled to target. she is advised to continue her current medications including lisinopril 10 mg p.o. daily, metoprolol 25 mg p.o. twice daily. 3) Lipids/Hyperlipidemia:   Review of her recent lipid panel showed  uncontrolled  LDL at 76-114 .  she  is advised to continue atorvastatin 40 mg p.o. daily at bedtime.   Side effects and precautions discussed with her.  4)  Weight/Diet:  Body mass index is  40.35 kg/m.  -   clearly complicating her diabetes care.   she is  a candidate for weight loss. I discussed with her the fact that loss of 5 - 10% of her  current body weight will have the most impact on her diabetes management.  Exercise, and detailed carbohydrates information provided  -  detailed on discharge instructions. She would benefit from bariatric weight management which is briefly discussed with her.  This procedure will be further discussed on subsequent visits.  5) Chronic Care/Health Maintenance:  -she  is on ACEI/ARB and Statin medications and  is encouraged to initiate and continue to follow up with Ophthalmology, Dentist,  Podiatrist at least yearly or according to recommendations, and advised to   stay away from smoking. I have recommended yearly flu vaccine and pneumonia vaccine at least every 5 years; moderate intensity exercise for up to 150 minutes weekly; and  sleep for at least 7 hours a day.  - she is  advised to  maintain close follow up with Ladell Pier, MD for primary care needs, as well as her other providers for optimal and coordinated care.   - Time spent on this patient care encounter:  35 min, of which > 50% was spent in  counseling and the rest reviewing her blood glucose logs , discussing her hypoglycemia and hyperglycemia episodes, reviewing her current and  previous labs / studies  ( including abstraction from other facilities) and medications  doses and developing a  long term treatment plan and documenting her care.   Please refer to Patient Instructions for Blood Glucose Monitoring and Insulin/Medications Dosing Guide"  in media tab for additional information. Please  also refer to " Patient Self Inventory" in the Media  tab for reviewed elements of pertinent patient history.  Kelly Lang participated in the discussions, expressed understanding, and voiced agreement with the above plans.  All questions were answered to her satisfaction. she is encouraged to contact clinic should she have any questions or concerns prior to her return visit.   Follow up plan: - Return in about 3 months (around 12/25/2020) for F/U with Pre-visit Labs, Meter, Logs, A1c here., ABI in Office [place].  Glade Lloyd, MD [facility] [address] [place], [place] [address] Phone: [phone]  Fax: [phone]    09/24/2020, 4:27 PM  This note was partially dictated with voice recognition software. Similar sounding words can be transcribed inadequately or may not  be corrected upon review.

## 2020-09-24 NOTE — Patient Instructions (Signed)

## 2020-10-10 ENCOUNTER — Other Ambulatory Visit: Payer: Self-pay

## 2020-10-10 ENCOUNTER — Other Ambulatory Visit (HOSPITAL_COMMUNITY): Payer: Self-pay | Admitting: Family Medicine

## 2020-10-10 ENCOUNTER — Ambulatory Visit (HOSPITAL_COMMUNITY)
Admission: EM | Admit: 2020-10-10 | Discharge: 2020-10-10 | Disposition: A | Discharge status: Home / Self Care | Payer: 59 | Attending: Family Medicine | Admitting: Family Medicine

## 2020-10-10 ENCOUNTER — Encounter (HOSPITAL_COMMUNITY): Payer: Self-pay

## 2020-10-10 DIAGNOSIS — Z20822 Contact with and (suspected) exposure to covid-19: Secondary | ICD-10-CM | POA: Diagnosis not present

## 2020-10-10 DIAGNOSIS — J0100 Acute maxillary sinusitis, unspecified: Secondary | ICD-10-CM | POA: Diagnosis not present

## 2020-10-10 DIAGNOSIS — R059 Cough, unspecified: Secondary | ICD-10-CM | POA: Insufficient documentation

## 2020-10-10 LAB — SARS CORONAVIRUS 2 (TAT 6-24 HRS): SARS Coronavirus 2: NEGATIVE

## 2020-10-10 MED ORDER — AMOXICILLIN 875 MG PO TABS: 875.0000 mg | ORAL_TABLET | Freq: Two times a day (BID) | ORAL | 0 refills | Status: DC

## 2020-10-10 MED ORDER — FLUTICASONE-SALMETEROL 250-50 MCG/DOSE IN AEPB: 1.0000 | INHALATION_SPRAY | Freq: Two times a day (BID) | RESPIRATORY_TRACT | 0 refills | Status: DC

## 2020-10-10 MED ORDER — PROMETHAZINE-DM 6.25-15 MG/5ML PO SYRP: 5.0000 mL | ORAL_SOLUTION | Freq: Four times a day (QID) | ORAL | 0 refills | Status: DC | PRN

## 2020-10-10 MED ORDER — BUDESONIDE-FORMOTEROL FUMARATE 160-4.5 MCG/ACT IN AERO: 2.0000 | INHALATION_SPRAY | Freq: Two times a day (BID) | RESPIRATORY_TRACT | 0 refills | Status: DC

## 2020-10-10 MED FILL — METFORMIN HCL 1000 MG TABS: 1000 | 30 days supply | Qty: 60 | Fill #1

## 2020-10-10 MED FILL — PROMETHAZINE-DM 6.25-15 MG/: 6.25-15 | 5 days supply | Qty: 100 | Fill #0

## 2020-10-10 MED FILL — LANTUS SOLOSTAR 100 UNITS/M: 100 | 37 days supply | Qty: 30 | Fill #0

## 2020-10-10 MED FILL — ADVAIR 250/50 DISKUS: 250-50 | 30 days supply | Qty: 60 | Fill #0

## 2020-10-10 MED FILL — AMOXICILLIN 875 MG TABS: 875 | 7 days supply | Qty: 14 | Fill #0

## 2020-10-10 NOTE — ED Provider Notes (Signed)
[place]    CSN: [id number] Arrival date & time: 10/10/20  1056      History   Chief Complaint Chief Complaint  Patient presents with  . Cough    HPI Kelly Lang is a 59 y.o. female.   Presenting today with over 2 weeks of worsening facial pain and pressure, thick nasal congestion, and hacking productive cough that has caused significant chest soreness. Now also having fatigue, SOB on exertion. She denies known fever, body aches, abdominal pain or N/V/D, CP. Taking mucinex, dayquil, nyquil, tessalon, and albuterol inhaler without relief. States a co-worker was sick and passed it on to several others and that is when all of this started. The co-worker tested neg for COVID, she has not personally been tested yet. No known hx of asthma or allergies.      Past Medical History:  Diagnosis Date  . Arthritis    "all over" (06/30/2017)  . Chicken pox   . Dyslipidemia   . Dysrhythmia ablation 2018   a-fib  . GERD (gastroesophageal reflux disease)   . H/O: hypothyroidism    a. thyroid biopsy 1996 with synthroid. Stopped taking rx and was loss to follow up b. normal thyroid function 10/20/13  . High cholesterol   . Hx of cardiovascular stress test    ETT/Lexiscan Myoview (12/2013):  No ischemia; not gated; low risk.  Marland Kitchen Hypertension   . Obesity   . Persistent atrial fibrillation ([place])    a diagnosed 10/25/2013  . Psoriasis    on Skyrizi  . Seasonal allergies   . Stroke [facility]) 2016   "some speech problems since" (06/30/2017)  . 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 2/2 Afib with RVR for unknown duration (EF: 40-45%)  . Thyroid disease   . Type II diabetes mellitus ([place])    a. hg A1c 10, newly diagnosed (10/26/13)    Patient Active Problem List   Diagnosis Date Noted  . Mixed hyperlipidemia 09/17/2020  . Multinodular goiter 09/16/2019  . Chronic pain disorder 08/09/2018  . Diabetic polyneuropathy associated with type 2 diabetes mellitus ([place])  08/09/2018  . Gastroparesis 12/21/2017  . GI bleed 12/06/2017  . Hepatic steatosis 12/04/2017  . Chronic systolic CHF (congestive heart failure) ([place]) 09/16/2015  . Cerebrovascular accident (CVA) due to embolism of left middle cerebral artery ([place])   . Low back pain 04/03/2014  . Unspecified vitamin D deficiency 01/20/2014  . Essential hypertension, benign 01/20/2014  . Chronic anticoagulation 10/31/2013  . Tachycardia induced cardiomyopathy ([place])   . H/O: hypothyroidism   . Class 3 severe obesity with serious comorbidity and body mass index (BMI) of 40.0 to 44.9 in adult [facility])   . DM type 2 causing vascular disease ([place])   . Arthritis   . Dyslipidemia   . Atrial fibrillation  10/25/2013    Past Surgical History:  Procedure Laterality Date  . ATRIAL FIBRILLATION ABLATION  06/30/2017  . ATRIAL FIBRILLATION ABLATION N/A 06/30/2017   Procedure: Atrial Fibrillation Ablation;  Surgeon: Thompson Grayer, MD;  Location: [place] CV LAB;  Service: Cardiovascular;  Laterality: N/A;  . BICEPT TENODESIS Right 05/24/2020   Procedure: BICEPS TENODESIS;  Surgeon: Meredith Pel, MD;  Location: [place];  Service: Orthopedics;  Laterality: Right;  . BIOPSY THYROID  1996  . CARDIOVERSION N/A 12/07/2013   Procedure: CARDIOVERSION;  Surgeon: Casandra Doffing, MD;  Location: [place];  Service: Cardiovascular;  Laterality: N/A;  . CARDIOVERSION N/A 03/10/2016   Procedure: CARDIOVERSION;  Surgeon: Thayer Headings, MD;  Location: [facility] ENDOSCOPY;  Service: Cardiovascular;  Laterality: N/A;  . ESOPHAGOGASTRODUODENOSCOPY N/A 12/07/2017   Procedure: ESOPHAGOGASTRODUODENOSCOPY (EGD);  Surgeon: Irene Shipper, MD;  Location: [facility] ENDOSCOPY;  Service: Endoscopy;  Laterality: N/A;  . EYE MUSCLE SURGERY Right ~ 1966  . SHOULDER ARTHROSCOPY WITH ROTATOR CUFF REPAIR Right 05/24/2020   Procedure: right shoulder arthroscopy, rotator cuff tear repair biceps tenodesis;  Surgeon: Meredith Pel, MD;   Location: [place];  Service: Orthopedics;  Laterality: Right;  . TEE WITHOUT CARDIOVERSION N/A 06/29/2017   Procedure: TRANSESOPHAGEAL ECHOCARDIOGRAM (TEE);  Surgeon: Fay Records, MD;  Location: [facility] ENDOSCOPY;  Service: Cardiovascular;  Laterality: N/A;  . TONSILLECTOMY  1971    OB History   No obstetric history on file.      Home Medications    Prior to Admission medications   Medication Sig Start Date End Date Taking? Authorizing Provider  OZEMPIC, 1 MG/DOSE, 4 MG/3ML SOPN INJECT 1 MG INTO THE SKIN ONCE A WEEK 07/30/20  Yes [provider]  SKYRIZI, 150 MG DOSE, 75 MG/0.83ML PSKT Inject 150 mg as directed See admin instructions. 4x's per year 08/25/19  Yes [provider]  amoxicillin (AMOXIL) 875 MG tablet Take 1 tablet (875 mg total) by mouth 2 (two) times daily. 10/10/20   Volney American, PA-C  atorvastatin (LIPITOR) 40 MG tablet TAKE 1 TABLET (40 MG TOTAL) BY MOUTH DAILY. 08/23/20   Ladell Pier, MD  Blood Glucose Monitoring Suppl ([facility] [facility]) w/Device KIT Use as instructed to check blood sugar up to 3 times daily. 10/05/18   Ladell Pier, MD  budesonide-formoterol (SYMBICORT) 160-4.5 MCG/ACT inhaler Inhale 2 puffs into the lungs 2 (two) times daily. Rinse mouth with water after each use 10/10/20   Volney American, PA-C  diclofenac sodium (VOLTAREN) 1 % GEL APPLY 2 GRAMS TOPICALLY 4 (FOUR) TIMES DAILY AS NEEDED (PAIN). 09/08/19   Ladell Pier, MD  DULoxetine (CYMBALTA) 20 MG capsule TAKE 1 CAPSULE (20 MG TOTAL) BY MOUTH DAILY. 09/10/20   Ladell Pier, MD  glipiZIDE (GLUCOTROL XL) 5 MG 24 hr tablet Take 1 tablet (5 mg total) by mouth daily with breakfast. 09/24/20   Nida, Marella Chimes, MD  insulin glargine (LANTUS SOLOSTAR) 100 UNIT/ML Solostar Pen Inject 80 Units into the skin at bedtime. 09/17/20   Cassandria Anger, MD  lisinopril (ZESTRIL) 10 MG tablet Take 1 tablet (10 mg total) by mouth  daily. 08/18/20   Ladell Pier, MD  metFORMIN (GLUCOPHAGE) 1000 MG tablet TAKE 1 TABLET (1,000 MG TOTAL) BY MOUTH 2 (TWO) TIMES DAILY WITH A MEAL. 08/23/20   Ladell Pier, MD  metoprolol tartrate (LOPRESSOR) 25 MG tablet Take 1.5 tablets (37.5 mg total) by mouth 2 (two) times daily. Please make yearly appt with Dr. Acie Fredrickson for November for future refills. 1st attempt 08/01/20   Nahser, Wonda Cheng, MD  metroNIDAZOLE (METROCREAM) 0.75 % cream APPLY THIN LAYER TO FACE TWICE DAILY 05/16/19   [provider]  [facility] DELICA LANCETS 09M MISC Use as instructed to check blood sugar up to 3 times daily. 10/05/18   Ladell Pier, MD  ONETOUCH VERIO test strip USE AS DIRECTED UP TO THREE TIMES DAILY TO  TEST  BLOOD  SUGAR 11/21/19   Ladell Pier, MD  promethazine-dextromethorphan (PROMETHAZINE-DM) 6.25-15 MG/5ML syrup Take 5 mLs by mouth 4 (four) times daily as needed for cough. 10/10/20   Volney American,  PA-C  TRUEPLUS PEN NEEDLES 32G X 4 MM MISC USE AS DIRECTED AT BEDTIME. 08/23/20   Ladell Pier, MD  XARELTO 20 MG TABS tablet TAKE 1 TABLET BY MOUTH DAILY WITH SUPPER. 10/13/19   Thompson Grayer, MD    Family History Family History  Adopted: Yes  Problem Relation Age of Onset  . Hypertension Mother   . Hyperlipidemia Mother     Social History Social History   Tobacco Use  . Smoking status: Former Smoker    Packs/day: 1.00    Years: 22.00    Pack years: 22.00    Types: Cigarettes    Quit date: 12/01/2010    Years since quitting: 9.8  . Smokeless tobacco: Never Used  Vaping Use  . Vaping Use: Never used  Substance Use Topics  . Alcohol use: Yes    Comment: social  . Drug use: No     Allergies   Jardiance [empagliflozin] and Codeine   Review of Systems Review of Systems PER HPI    Physical Exam Triage Vital Signs ED Triage Vitals  Enc Vitals Group     BP 10/10/20 1304 (!) 106/58     Pulse Rate 10/10/20 1304 78     Resp 10/10/20 1304 19      Temp 10/10/20 1304 98.4 F (36.9 C)     Temp Source 10/10/20 1304 Oral     SpO2 10/10/20 1304 97 %     Weight --      Height --      Head Circumference --      Peak Flow --      Pain Score 10/10/20 1301 5     Pain Loc --      Pain Edu? --      Excl. in [place]? --    No data found.  Updated Vital Signs BP (!) 106/58 (BP Location: Right Arm)   Pulse 78   Temp 98.4 F (36.9 C) (Oral)   Resp 19   LMP 04/15/2015 (LMP Unknown) Comment: last in may  SpO2 97%   Visual Acuity Right Eye Distance:   Left Eye Distance:   Bilateral Distance:    Right Eye Near:   Left Eye Near:    Bilateral Near:     Physical Exam Vitals and nursing note reviewed.  Constitutional:      Appearance: Normal appearance. She is not ill-appearing.  HENT:     Head: Atraumatic.     Comments: B/l maxillary sinuses ttp     Right Ear: Tympanic membrane normal.     Left Ear: Tympanic membrane normal.     Nose: Congestion present.     Mouth/Throat:     Mouth: Mucous membranes are moist.     Pharynx: Posterior oropharyngeal erythema present.  Eyes:     Extraocular Movements: Extraocular movements intact.     Conjunctiva/sclera: Conjunctivae normal.  Cardiovascular:     Rate and Rhythm: Normal rate and regular rhythm.     Heart sounds: Normal heart sounds.  Pulmonary:     Effort: Pulmonary effort is normal.     Breath sounds: Normal breath sounds. No wheezing or rales.  Musculoskeletal:        General: Normal range of motion.     Cervical back: Normal range of motion and neck supple.  Skin:    General: Skin is warm and dry.  Neurological:     Mental Status: She is alert and oriented to person, place, and time.  Psychiatric:  Mood and Affect: Mood normal.        Thought Content: Thought content normal.        Judgment: Judgment normal.     UC Treatments / Results  Labs (all labs ordered are listed, but only abnormal results are displayed) Labs Reviewed  SARS CORONAVIRUS 2 (TAT 6-24 HRS)      EKG   Radiology No results found.  Procedures Procedures (including critical care time)  Medications Ordered in UC Medications - No data to display  Initial Impression / Assessment and Plan / UC Course  I have reviewed the triage vital signs and the nursing notes.  Pertinent labs & imaging results that were available during my care of the patient were reviewed by me and considered in my medical decision making (see chart for details).     Vital signs stable, exam consistent with sinusitis and bronchitis, given progressing sxs x 14+ days not relieved by OTC remedies will start amoxil, symbicort inhaler for her bronchitis rather than PO steroids given her poorly controlled DM. Continue mucinex, supportive home care. F/u if sxs worsening or not resolving.   Final Clinical Impressions(s) / UC Diagnoses   Final diagnoses:  Acute non-recurrent maxillary sinusitis  Cough   Discharge Instructions   None    ED Prescriptions    Medication Sig Dispense Auth. Provider   amoxicillin (AMOXIL) 875 MG tablet Take 1 tablet (875 mg total) by mouth 2 (two) times daily. 14 tablet Volney American, [place]   budesonide-formoterol [facility]) 160-4.5 MCG/ACT inhaler Inhale 2 puffs into the lungs 2 (two) times daily. Rinse mouth with water after each use 1 each Volney American, PA-C   promethazine-dextromethorphan (PROMETHAZINE-DM) 6.25-15 MG/5ML syrup Take 5 mLs by mouth 4 (four) times daily as needed for cough. 100 mL Volney American, [place]     PDMP not reviewed this encounter.   Volney American, [place] 10/10/20 1332

## 2020-10-10 NOTE — ED Triage Notes (Signed)
Pt in with c/o productive cough and sinus pressure that started back in October on 10/26. States mucus is very yellow and she has back pain from coughing so much.  Also states decrease in appetite  Pt has tried mucinex and nyquil with no relief

## 2020-10-12 IMAGING — XA DG FLUORO GUIDE NDL PLC/BX
3 series · 3 of 3 positions shown · non-contrast
Comparison: none

CLINICAL DATA: Shoulder pain.  Impingement syndrome.

[Series 1: ortho adipose · 1 of 1 slices shown (1 of 3)]
[im 1/1]
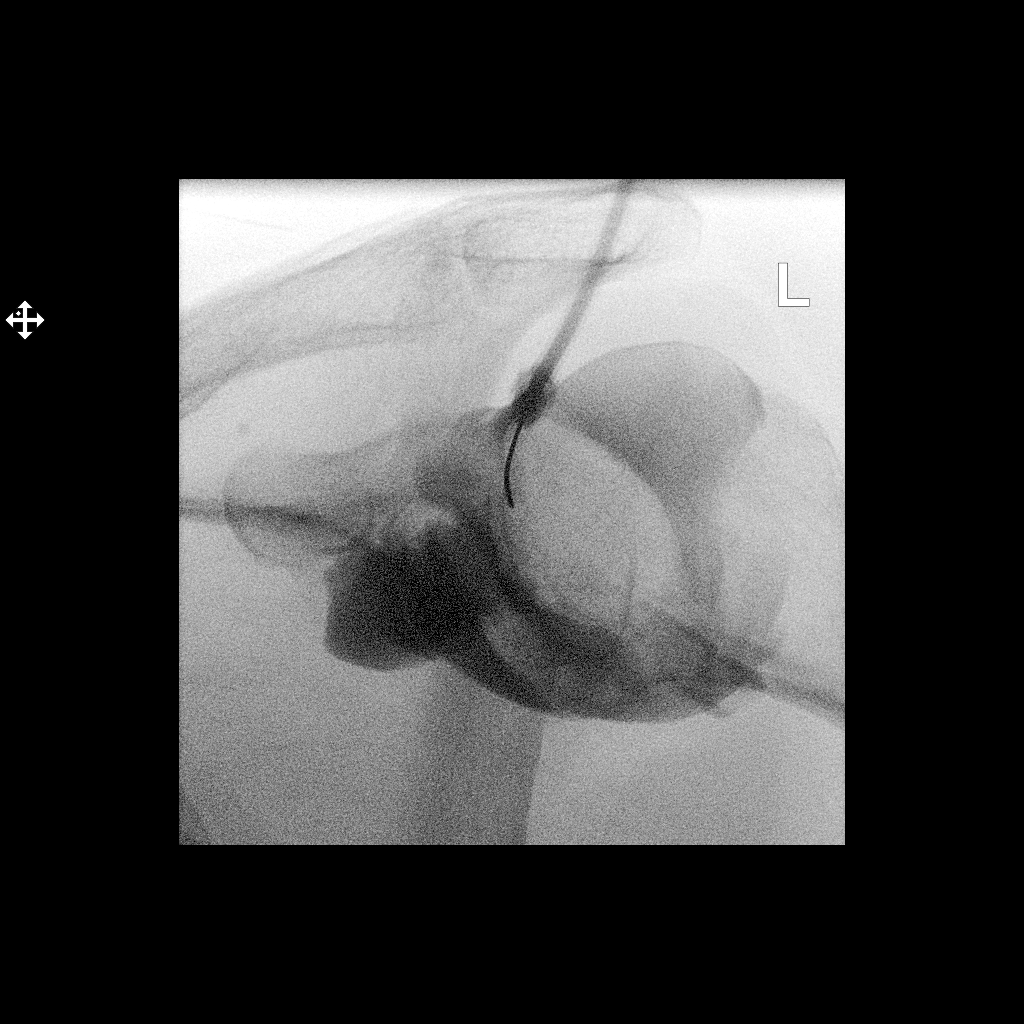

[Series 2: ortho adipose · 1 of 1 slices shown (2 of 3)]
[im 1/1]
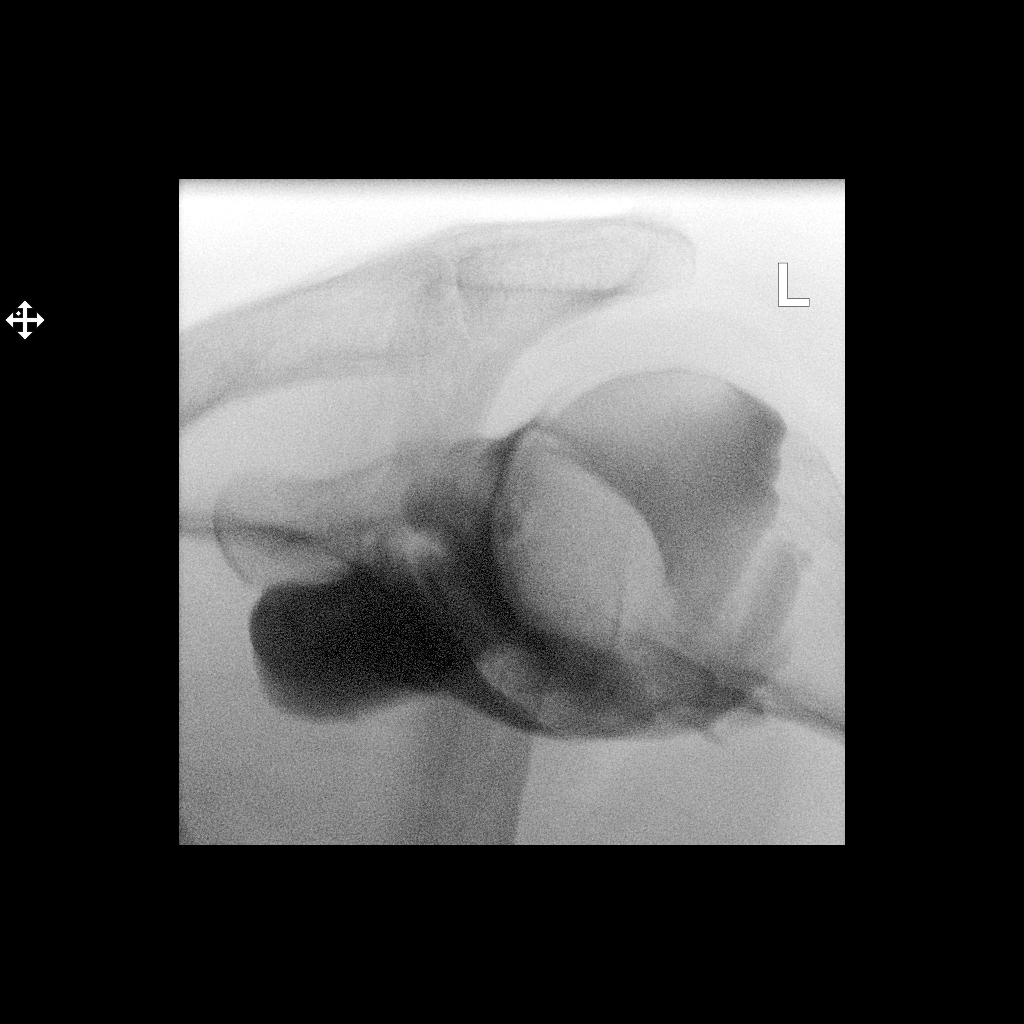

[Series 3: ortho adipose · 1 of 1 slices shown (3 of 3)]
[im 1/1]
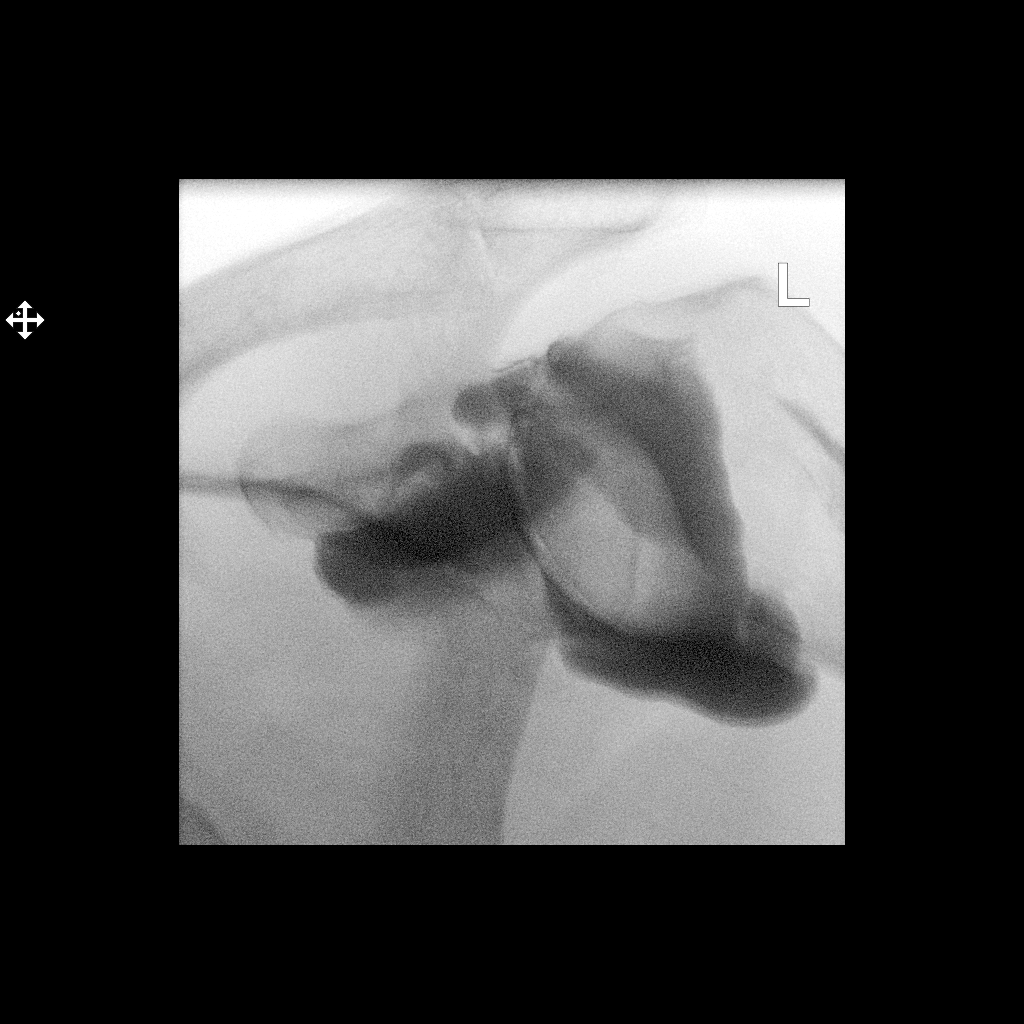

[3 of 3 positions shown; findings below may reference images not displayed]

FLUOROSCOPY TIME:  0 minutes 41 seconds. 31.70 micro gray meter
squared

PROCEDURE:
Left SHOULDER INJECTION UNDER FLUOROSCOPY

The skin overlying the shoulder was scrubbed with Betadine and
draped in sterile fashion. Skin and subcutaneous anesthesia was
carried out using a 25 gauge needle and 1% lidocaine. A 22 gauge
spinal needle was directed under fluoroscopic guidance on one pass
into the glenohumeral joint. 20 cc of a mixture of 0.1 cc MultiHance
and dilute Isovue 200 was then used to fill the glenohumeral joint.
IMPRESSION: Technically successful left shoulder injection for MRI.

## 2020-10-17 ENCOUNTER — Telehealth: Payer: Self-pay | Admitting: "Endocrinology

## 2020-10-17 DIAGNOSIS — E1159 Type 2 diabetes mellitus with other circulatory complications: Secondary | ICD-10-CM

## 2020-10-17 NOTE — Telephone Encounter (Signed)
Pt is calling and states her sugar last night was 245, this morning it was 236 and after breakfast it was 354. Requesting call back

## 2020-10-17 NOTE — Telephone Encounter (Signed)
Pt states she had an URI that started the last week in October and since then her BG has been elevated. States she has taken an antibiotic and used an inhaler Advair. States she's taking metformin 1000mg  twice daily, glipizide XL 5mg  daily and Lantus 80 units daily at bedtime. Pt's BG readings for Sunday 335 at breakfast, 238 at bedtime, Monday 213 at breakfast, 228 at bedtime, Tuesday 249 at breakfast, 246 at bedtime and this morning 236 at breakfast.

## 2020-10-18 MED ORDER — GLIPIZIDE ER 5 MG PO TB24: ORAL_TABLET | ORAL | 0 refills | Status: DC

## 2020-10-18 NOTE — Telephone Encounter (Signed)
Advised pt to increase her glipizide to 10mg  (2 pills) daily and to contact if her BG > 200 x 3 per Dr.Nida's orders. Understanding voiced per pt. Rx sent in for the glipizide 5mg  take 2 daily to pt's pharmacy.

## 2020-10-18 NOTE — Telephone Encounter (Signed)
She can increase Glipizide to 10mg  ( 2 pills of 5mg  ) , call back if >200 x 3.

## 2020-10-29 ENCOUNTER — Other Ambulatory Visit: Payer: Self-pay | Admitting: Internal Medicine

## 2020-10-29 ENCOUNTER — Telehealth: Payer: Self-pay | Admitting: "Endocrinology

## 2020-10-29 ENCOUNTER — Other Ambulatory Visit: Payer: Self-pay

## 2020-10-29 DIAGNOSIS — E1159 Type 2 diabetes mellitus with other circulatory complications: Secondary | ICD-10-CM

## 2020-10-29 MED ORDER — OZEMPIC (1 MG/DOSE) 4 MG/3ML ~~LOC~~ SOPN: PEN_INJECTOR | SUBCUTANEOUS | 1 refills | Status: DC

## 2020-10-29 MED FILL — OZEMPIC (1 MG/DOSE) 4 MG/3M: 4 | 84 days supply | Qty: 9 | Fill #0

## 2020-10-29 MED FILL — XARELTO 20 MG TABLET: 20 | 30 days supply | Qty: 30 | Fill #0

## 2020-10-29 NOTE — Telephone Encounter (Signed)
Maxine Glenn, PA on 04/05/20, last labs 05/21/20 Creat 0.74, age 59, weight 111.7kg, CrCl 147.91, based on CrCl pt is on appropriate dosage of Xarelto 20mg  QD.  Will refill rx.

## 2020-10-29 NOTE — Telephone Encounter (Signed)
Pt states she just ran out of  OZEMPIC, 1 MG/DOSE, 4 MG/3ML SOPN  And is needing a refill  [facility] - [place], [place] - [address] Phone:  [phone]  Fax:  [phone]

## 2020-10-29 NOTE — Telephone Encounter (Signed)
Sent in

## 2020-11-01 MED FILL — ATORVASTATIN CALCIUM 40 MG: 40 | 30 days supply | Qty: 60 | Fill #1

## 2020-11-02 ENCOUNTER — Encounter: Payer: Self-pay | Admitting: Gastroenterology

## 2020-11-02 ENCOUNTER — Ambulatory Visit (INDEPENDENT_AMBULATORY_CARE_PROVIDER_SITE_OTHER): Payer: 59 | Admitting: Gastroenterology

## 2020-11-02 VITALS — BP 120/80 | HR 78 | Ht 65.5 in | Wt 245.0 lb

## 2020-11-02 DIAGNOSIS — R112 Nausea with vomiting, unspecified: Secondary | ICD-10-CM

## 2020-11-02 DIAGNOSIS — K3184 Gastroparesis: Secondary | ICD-10-CM

## 2020-11-02 MED ORDER — METOCLOPRAMIDE HCL 5 MG PO TABS: 5.0000 mg | ORAL_TABLET | Freq: Three times a day (TID) | ORAL | 11 refills | Status: DC

## 2020-11-02 MED ORDER — OMEPRAZOLE 20 MG PO CPDR: 20.0000 mg | DELAYED_RELEASE_CAPSULE | Freq: Every day | ORAL | 11 refills | Status: DC

## 2020-11-02 MED FILL — OMEPRAZOLE 20 MG CAP: 20 | 30 days supply | Qty: 30 | Fill #0

## 2020-11-02 MED FILL — METOCLOPRAMIDE 5 MG TABLET: 5 | 30 days supply | Qty: 90 | Fill #0

## 2020-11-02 NOTE — Progress Notes (Signed)
    History of Present Illness: This is a 59 year old female with diabetes mellitus and gastroparesis.  Previously treated with omeprazole and metoclopramide.  When her diabetes was under good control she was able to discontinue these medications.  Recently she has had suboptimal diabetic control.  She has changed endocrinologists and she related her diabetes management has been improving.  Hemoglobin A1c was 9.1 six months ago and 8.3 one month ago.  She relates nausea and vomiting almost after every meal.  No other gastrointestinal complaints.  Current Medications, Allergies, Past Medical History, Past Surgical History, Family History and Social History were reviewed in [employer] record.   Physical Exam: General: Well developed, well nourished, no acute distress Head: Normocephalic and atraumatic Eyes:  sclerae anicteric, EOMI Ears: Normal auditory acuity Mouth: Not examined, mask on during Covid-19 pandemic Lungs: Clear throughout to auscultation Heart: Regular rate and rhythm; no murmurs, rubs or bruits Abdomen: Soft, non tender and non distended. No masses, hepatosplenomegaly or hernias noted. Normal Bowel sounds Rectal: Not done Musculoskeletal: Symmetrical with no gross deformities  Pulses:  Normal pulses noted Extremities: No clubbing, cyanosis, edema or deformities noted Neurological: Alert oriented x 4, grossly nonfocal Psychological:  Alert and cooperative. Normal mood and affect   Assessment and Recommendations:  1. Gastroparesis.  Symptoms exacerbated by suboptimal blood glucose control.  Advised close follow-up with her endocrinologist for optimal management.  Resume omeprazole 20 mg daily and metoclopramide 5 mg p.o. 3 times daily AC.  She is advised to discontinue metoclopramide as soon as her symptoms are under control.  2. DM.  Suboptimal control.  Follow-up with endocrinologist as planned.  3. BMI=40.15.  Long-term weight loss program is  recommended.  4. CRC screening, average risk.  A 10-year interval screening colonoscopy is recommended in May 2028.

## 2020-11-02 NOTE — Patient Instructions (Signed)
We have sent the following medications to your pharmacy for you to pick up at your convenience: reglan and omeprazole.   Thank you for choosing me and [facility].  Venita Lick. Pleas Koch., MD., Clementeen Graham

## 2020-11-15 ENCOUNTER — Ambulatory Visit: Payer: 59 | Attending: Internal Medicine | Admitting: Internal Medicine

## 2020-11-15 ENCOUNTER — Other Ambulatory Visit: Payer: Self-pay

## 2020-11-15 DIAGNOSIS — E1142 Type 2 diabetes mellitus with diabetic polyneuropathy: Secondary | ICD-10-CM | POA: Diagnosis not present

## 2020-11-15 DIAGNOSIS — I5022 Chronic systolic (congestive) heart failure: Secondary | ICD-10-CM | POA: Diagnosis not present

## 2020-11-15 DIAGNOSIS — E66813 Obesity, class 3: Secondary | ICD-10-CM

## 2020-11-15 DIAGNOSIS — Z6841 Body Mass Index (BMI) 40.0 and over, adult: Secondary | ICD-10-CM

## 2020-11-15 DIAGNOSIS — E538 Deficiency of other specified B group vitamins: Secondary | ICD-10-CM

## 2020-11-15 DIAGNOSIS — I10 Essential (primary) hypertension: Secondary | ICD-10-CM | POA: Diagnosis not present

## 2020-11-15 DIAGNOSIS — Z794 Long term (current) use of insulin: Secondary | ICD-10-CM

## 2020-11-15 NOTE — Progress Notes (Signed)
Pt states her blood sugar this morning was 153

## 2020-11-15 NOTE — Progress Notes (Signed)
Virtual Visit via Telephone Note  I connected with Kelly Lang on 11/15/20 at 10:26 a.m by telephone and verified that I am speaking with the correct person using two identifiers.  Location: Patient: work Provider: office   I discussed the limitations, risks, security and privacy concerns of performing an evaluation and management service by telephone and the availability of in person appointments. I also discussed with the patient that there may be a patient responsible charge related to this service. The patient expressed understanding and agreed to proceed.   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  DIABETES TYPE 2/Obesity Last A1C:   Lab Results  Component Value Date   HGBA1C 8.3 (A) 09/24/2020  Med Adherence: Since last visit with me, she has seen a new endocrinologist Dr. Fransico Lang.  He is change the glipizide to long-acting, decrease Lantus to 80 units, continued Metformin and continued Ozempic.  Patient states she feels much better on this regimen.  Most recent A1c was 8.3. Medication side effects:  []  Yes    [x]  No Home Monitoring?  [x]  Yes  BID in a.m and before dinner Home glucose results range: a.m range 130-150 and before dinner 110-130 Diet Adherence:  "Much better.  I'm trying." Wgh staying at 240 lbs Exercise: [x]  Yes -try to exercise but sometimes it is dark when she gets home Hypoglycemic episodes?: []  Yes    [x]  No Numbness of the feet? []  Yes    [x]  No.  Ulcer on LT foot has healed Retinopathy hx? []  Yes    []  No Last eye exam: Over due.  She will have eye insurance 1st yr and will schedule with Dr. . Comments: Saw the podiatrist since last visit with me.  Also on left second toe has healed.   HYPERTENSION/CHF/A.fib Currently taking: see medication list Med Adherence: [x]   Yes    []  No Medication side effects: []  Yes    [x]  No Adherence with salt restriction: [x]  Yes    []  No Home Monitoring?: []  Yes    [x]  No but blood pressure at endocrinology visits have been at goal. Monitoring Frequency: []  Yes    []  No Home BP results range: []  Yes    []  No SOB? []  Yes    [x]  No Chest Pain?: []  Yes    [x]  No Leg swelling?: []  Yes    [x]  No Headaches?: []  Yes    [x]  No Dizziness? []  Yes    [x]  No.   Comments:  No bruising or bleeding on Xalrelto.  No palpitations  Relative B12 def:  Did receive results via Mychart and taking Vit B12 500 mcg daily as advised.  HM: Reports that she has received her COVID booster vaccine already. Observations/Objective: Lab Results  Component Value Date   WBC 7.5 08/16/2020   HGB 14.9 08/16/2020   HCT 42.4 08/16/2020   MCV 94 08/16/2020   PLT 276 08/16/2020     Chemistry      Component Value Date/Time   NA 139 05/21/2020 0900   NA 138 04/05/2020 0932   K 3.9 05/21/2020 0900   CL 104 05/21/2020 0900   CO2 25 05/21/2020 0900   BUN 14 05/21/2020 0900   BUN 15 04/05/2020 0932   CREATININE 0.74 05/21/2020 0900   CREATININE 0.90 06/16/2016 1128      Component Value Date/Time   CALCIUM  9.2 05/21/2020 0900   ALKPHOS 82 08/16/2020 0941   AST 12 08/16/2020 0941   ALT 21 08/16/2020 0941   BILITOT 0.3 08/16/2020 0941     Lab Results  Component Value Date   CHOL 140 08/16/2020   HDL 36 (L) 08/16/2020   LDLCALC 76 08/16/2020   LDLDIRECT 114.0 02/13/2015   TRIG 162 (H) 08/16/2020   CHOLHDL 3.9 08/16/2020      Assessment and Plan: 1. Essential hypertension At goal. Continue lisinopril, metoprolol Continue DASH diet  2. Type 2 diabetes mellitus with diabetic polyneuropathy, with long-term current use of insulin (HCC) Followed by endocrinology.  She continues to be compliant with her medication occasions.  Reported blood sugars seem better.  Encouraged her to try to get in some exercise at least 3 to 4 days a week for  30 minutes  3. Class 3 severe obesity with serious comorbidity and body mass index (BMI) of 40.0 to 44.9 in adult, unspecified obesity type (HCC) See #2 above  4. Chronic systolic CHF (congestive heart failure) (HCC) Clinically stable.  5. Vitamin B12 deficiency Continue vitamin B12 supplement 500 mcg daily.   Follow Up Instructions: 4 mths   I discussed the assessment and treatment plan with the patient. The patient was provided an opportunity to ask questions and all were answered. The patient agreed with the plan and demonstrated an understanding of the instructions.   The patient was advised to call back or seek an in-person evaluation if the symptoms worsen or if the condition fails to improve as anticipated.  I provided of non-face-to-face time during this encounter.   Jonah Blue, MD

## 2020-11-20 MED FILL — LANTUS SOLOSTAR 100 UNITS/M: 100 | 37 days supply | Qty: 30 | Fill #1

## 2020-11-22 MED FILL — XARELTO 20 MG TABLET: 20 | 30 days supply | Qty: 30 | Fill #1

## 2020-11-22 MED FILL — METFORMIN HCL 1000 MG TABS: 1000 | 30 days supply | Qty: 60 | Fill #2

## 2020-11-27 MED FILL — DICLOFENAC SODIUM 1% GEL: 1 | 12 days supply | Qty: 100 | Fill #1

## 2020-11-28 MED FILL — glipiZIDE XL 5 MG TB24: 5 | 90 days supply | Qty: 180 | Fill #0

## 2020-12-14 ENCOUNTER — Other Ambulatory Visit: Payer: Self-pay | Admitting: Internal Medicine

## 2020-12-20 LAB — HM DIABETES EYE EXAM

## 2020-12-25 ENCOUNTER — Other Ambulatory Visit: Payer: Self-pay | Admitting: Nurse Practitioner

## 2020-12-25 ENCOUNTER — Other Ambulatory Visit: Payer: Self-pay | Admitting: Internal Medicine

## 2020-12-25 DIAGNOSIS — E1110 Type 2 diabetes mellitus with ketoacidosis without coma: Secondary | ICD-10-CM

## 2020-12-25 DIAGNOSIS — I10 Essential (primary) hypertension: Secondary | ICD-10-CM

## 2020-12-26 ENCOUNTER — Encounter: Payer: Self-pay | Admitting: Internal Medicine

## 2020-12-26 ENCOUNTER — Other Ambulatory Visit: Payer: Self-pay

## 2020-12-26 ENCOUNTER — Encounter: Payer: Self-pay | Admitting: "Endocrinology

## 2020-12-26 ENCOUNTER — Ambulatory Visit (INDEPENDENT_AMBULATORY_CARE_PROVIDER_SITE_OTHER): Payer: 59 | Admitting: "Endocrinology

## 2020-12-26 VITALS — BP 143/83 | HR 80 | Ht 65.5 in | Wt 248.4 lb

## 2020-12-26 DIAGNOSIS — E559 Vitamin D deficiency, unspecified: Secondary | ICD-10-CM

## 2020-12-26 DIAGNOSIS — E1159 Type 2 diabetes mellitus with other circulatory complications: Secondary | ICD-10-CM | POA: Diagnosis not present

## 2020-12-26 DIAGNOSIS — E782 Mixed hyperlipidemia: Secondary | ICD-10-CM | POA: Diagnosis not present

## 2020-12-26 DIAGNOSIS — I10 Essential (primary) hypertension: Secondary | ICD-10-CM

## 2020-12-26 LAB — COMPREHENSIVE METABOLIC PANEL
ALT: 34 IU/L — ABNORMAL HIGH (ref 0–32)
AST: 18 IU/L (ref 0–40)
Albumin/Globulin Ratio: 1.3 (ref 1.2–2.2)
Albumin: 3.9 g/dL (ref 3.8–4.9)
Alkaline Phosphatase: 67 IU/L (ref 44–121)
BUN/Creatinine Ratio: 17 (ref 9–23)
BUN: 10 mg/dL (ref 6–24)
Bilirubin Total: 0.4 mg/dL (ref 0.0–1.2)
CO2: 20 mmol/L (ref 20–29)
Calcium: 8.8 mg/dL (ref 8.7–10.2)
Chloride: 104 mmol/L (ref 96–106)
Creatinine, Ser: 0.58 mg/dL (ref 0.57–1.00)
GFR calc Af Amer: 118 mL/min/{1.73_m2} (ref >59)
GFR calc non Af Amer: 103 mL/min/{1.73_m2} (ref >59)
Globulin, Total: 2.9 g/dL (ref 1.5–4.5)
Glucose: 141 mg/dL — ABNORMAL HIGH (ref 65–99)
Potassium: 4.2 mmol/L (ref 3.5–5.2)
Sodium: 139 mmol/L (ref 134–144)
Total Protein: 6.8 g/dL (ref 6.0–8.5)

## 2020-12-26 LAB — LIPID PANEL
Chol/HDL Ratio: 3.6 ratio (ref 0.0–4.4)
Cholesterol, Total: 115 mg/dL (ref 100–199)
HDL: 32 mg/dL — ABNORMAL LOW (ref >39)
LDL Chol Calc (NIH): 49 mg/dL (ref 0–99)
Triglycerides: 208 mg/dL — ABNORMAL HIGH (ref 0–149)
VLDL Cholesterol Cal: 34 mg/dL (ref 5–40)

## 2020-12-26 LAB — VITAMIN D 25 HYDROXY (VIT D DEFICIENCY, FRACTURES): Vit D, 25-Hydroxy: 17.7 ng/mL — ABNORMAL LOW (ref 30.0–100.0)

## 2020-12-26 LAB — TSH: TSH: 0.597 u[IU]/mL (ref 0.450–4.500)

## 2020-12-26 LAB — T4, FREE: Free T4: 0.99 ng/dL (ref 0.82–1.77)

## 2020-12-26 MED ORDER — GLIPIZIDE ER 5 MG PO TB24: 5.0000 mg | ORAL_TABLET | Freq: Every day | ORAL | 1 refills | Status: DC

## 2020-12-26 MED ORDER — VITAMIN D (ERGOCALCIFEROL) 1.25 MG (50000 UNIT) PO CAPS: 50000.0000 [IU] | ORAL_CAPSULE | ORAL | 0 refills | Status: DC

## 2020-12-26 NOTE — Patient Instructions (Signed)

## 2020-12-26 NOTE — Progress Notes (Signed)
I received results of QuantiFERON gold test that was done by [facility] on 11/16/2020.  Results came back negative on 11/19/2020.

## 2020-12-26 NOTE — Progress Notes (Signed)
12/26/2020, 4:35 PM  Endocrinology follow-up note   Subjective:    Patient ID: Kelly Lang, female    DOB: [birth year].  Kelly Lang is being seen in follow-up after she was seen in consultation for management of currently uncontrolled symptomatic diabetes requested by  Ladell Pier, MD.   Past Medical History:  Diagnosis Date  . Arthritis    "all over" (06/30/2017)  . Chicken pox   . Dyslipidemia   . Dysrhythmia ablation 2018   a-fib  . GERD (gastroesophageal reflux disease)   . H/O: hypothyroidism    a. thyroid biopsy 1996 with synthroid. Stopped taking rx and was loss to follow up b. normal thyroid function 10/20/13  . High cholesterol   . Hx of cardiovascular stress test    ETT/Lexiscan Myoview (12/2013):  No ischemia; not gated; low risk.  Marland Kitchen Hypertension   . Obesity   . Persistent atrial fibrillation ([place])    a diagnosed 10/25/2013  . Psoriasis    on Skyrizi  . Seasonal allergies   . Stroke [facility]) 2016   "some speech problems since" (06/30/2017)  . 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 2/2 Afib with RVR for unknown duration (EF: 40-45%)  . Thyroid disease   . Type II diabetes mellitus ([place])    a. hg A1c 10, newly diagnosed (10/26/13)    Past Surgical History:  Procedure Laterality Date  . ATRIAL FIBRILLATION ABLATION  06/30/2017  . ATRIAL FIBRILLATION ABLATION N/A 06/30/2017   Procedure: Atrial Fibrillation Ablation;  Surgeon: Thompson Grayer, MD;  Location: [place] [facility];  Service: Cardiovascular;  Laterality: N/A;  . BICEPT TENODESIS Right 05/24/2020   Procedure: BICEPS TENODESIS;  Surgeon: Meredith Pel, MD;  Location: [place];  Service: Orthopedics;  Laterality: Right;  . BIOPSY THYROID  1996  . CARDIOVERSION N/A 12/07/2013   Procedure: CARDIOVERSION;  Surgeon: Casandra Doffing, MD;  Location: [place];  Service: Cardiovascular;   Laterality: N/A;  . CARDIOVERSION N/A 03/10/2016   Procedure: CARDIOVERSION;  Surgeon: Thayer Headings, MD;  Location: [facility] ENDOSCOPY;  Service: Cardiovascular;  Laterality: N/A;  . ESOPHAGOGASTRODUODENOSCOPY N/A 12/07/2017   Procedure: ESOPHAGOGASTRODUODENOSCOPY (EGD);  Surgeon: Irene Shipper, MD;  Location: [facility] ENDOSCOPY;  Service: Endoscopy;  Laterality: N/A;  . EYE MUSCLE SURGERY Right ~ 1966  . SHOULDER ARTHROSCOPY WITH ROTATOR CUFF REPAIR Right 05/24/2020   Procedure: right shoulder arthroscopy, rotator cuff tear repair biceps tenodesis;  Surgeon: Meredith Pel, MD;  Location: [place];  Service: Orthopedics;  Laterality: Right;  . TEE WITHOUT CARDIOVERSION N/A 06/29/2017   Procedure: TRANSESOPHAGEAL ECHOCARDIOGRAM (TEE);  Surgeon: Fay Records, MD;  Location: [place];  Service: Cardiovascular;  Laterality: N/A;  . TONSILLECTOMY  1971    Social History   Socioeconomic History  . Marital status: Divorced    Spouse name: Not on file  . Number of children: 0  . Years of education: 77  . Highest education level: Not on file  Occupational History  . Occupation: Chief Technology Officer  Tobacco Use  . Smoking status: Former Smoker    Packs/day: 1.00  Years: 22.00    Pack years: 22.00    Types: Cigarettes    Quit date: 12/01/2010    Years since quitting: 10.0  . Smokeless tobacco: Never Used  Vaping Use  . Vaping Use: Never used  Substance and Sexual Activity  . Alcohol use: Yes    Comment: social  . Drug use: No  . Sexual activity: Not Currently  Other Topics Concern  . Not on file  Social History Narrative   Moved to [place] from [place] in 2002.     Fun: shop, sew, decorate   Denies any beliefs effecting healthcare   Social Determinants of Health   Financial Resource Strain: Not on file  Food Insecurity: Not on file  Transportation Needs: Not on file  Physical Activity: Not on file  Stress: Not on file  Social Connections: Not on file     Family History  Adopted: Yes  Problem Relation Age of Onset  . Hypertension Mother   . Hyperlipidemia Mother     Outpatient Encounter Medications as of 12/26/2020  Medication Sig  . Vitamin D, Ergocalciferol, (DRISDOL) 1.25 MG (50000 UNIT) CAPS capsule Take 1 capsule (50,000 Units total) by mouth every 7 (seven) days.  Marland Kitchen atorvastatin (LIPITOR) 40 MG tablet TAKE 1 TABLET (40 MG TOTAL) BY MOUTH DAILY.  Marland Kitchen Blood Glucose Monitoring Suppl ([place]) w/Device KIT Use as instructed to check blood sugar up to 3 times daily.  . diclofenac sodium (VOLTAREN) 1 % GEL APPLY 2 GRAMS TOPICALLY 4 (FOUR) TIMES DAILY AS NEEDED (PAIN).  . DULoxetine (CYMBALTA) 20 MG capsule TAKE 1 CAPSULE (20 MG TOTAL) BY MOUTH DAILY.  Marland Kitchen glipiZIDE (GLUCOTROL XL) 5 MG 24 hr tablet TAKE 2 TABLETS DAILY (Patient taking differently: Take 5 mg by mouth daily with breakfast.)  . insulin glargine (LANTUS SOLOSTAR) 100 UNIT/ML Solostar Pen Inject 80 Units into the skin at bedtime.  Marland Kitchen lisinopril (ZESTRIL) 10 MG tablet Take 1 tablet (10 mg total) by mouth daily.  . metFORMIN (GLUCOPHAGE) 1000 MG tablet TAKE 1 TABLET (1,000 MG TOTAL) BY MOUTH 2 (TWO) TIMES DAILY WITH A MEAL.  Marland Kitchen metoCLOPramide (REGLAN) 5 MG tablet Take 1 tablet (5 mg total) by mouth 3 (three) times daily before meals.  . metoprolol tartrate (LOPRESSOR) 25 MG tablet Take 1.5 tablets (37.5 mg total) by mouth 2 (two) times daily. Please make yearly appt with Dr. Acie Fredrickson for November for future refills. 1st attempt  . metroNIDAZOLE (METROCREAM) 0.75 % cream APPLY THIN LAYER TO FACE TWICE DAILY  . omeprazole (PRILOSEC) 20 MG capsule Take 1 capsule (20 mg total) by mouth daily.  Glory Rosebush DELICA LANCETS 08Y MISC Use as instructed to check blood sugar up to 3 times daily.  Glory Rosebush VERIO test strip USE STRIPS TO CHECK GLUCOSE UP TO THREE TIMES DAILY AS DIRECTED  . OZEMPIC, 1 MG/DOSE, 4 MG/3ML SOPN INJECT 1 MG INTO THE SKIN ONCE A WEEK  . SKYRIZI, 150 MG  DOSE, 75 MG/0.83ML PSKT Inject 150 mg as directed See admin instructions. 4x's per year  . TRUEPLUS PEN NEEDLES 32G X 4 MM MISC USE AS DIRECTED AT BEDTIME.  Marland Kitchen XARELTO 20 MG TABS tablet TAKE 1 TABLET BY MOUTH DAILY WITH SUPPER.   No facility-administered encounter medications on file as of 12/26/2020.    ALLERGIES: Allergies  Allergen Reactions  . Jardiance [Empagliflozin] Other (See Comments)    Yeast infections  . Codeine Itching and Rash    VACCINATION STATUS: Immunization History  Administered Date(s) Administered  . Influenza,inj,Quad PF,6+ Mos 11/19/2016, 09/07/2017, 08/09/2018, 08/16/2020  . Moderna Sars-Covid-2 Vaccination 01/04/2020, 02/01/2020, 10/12/2020  . Pneumococcal Polysaccharide-23 06/16/2016  . Tdap 06/16/2016    Diabetes She presents for her follow-up diabetic visit. She has type 2 diabetes mellitus. Onset time: She was diagnosed at approximate age of 70 years. Her disease course has been stable. There are no hypoglycemic associated symptoms. Pertinent negatives for hypoglycemia include no confusion, headaches, pallor or seizures. Associated symptoms include fatigue. Pertinent negatives for diabetes include no chest pain, no polydipsia, no polyphagia and no polyuria. There are no hypoglycemic complications. Symptoms are improving. Diabetic complications include a CVA. Risk factors for coronary artery disease include dyslipidemia, diabetes mellitus, obesity, hypertension, post-menopausal, sedentary lifestyle and tobacco exposure. Current diabetic treatment includes insulin injections (She is currently on Levemir 155 units daily, Ozempic 1 mg weekly, glipizide 2.5 mg daily, Metformin 1000 mg p.o. twice daily.). Her weight is fluctuating minimally. She is following a generally unhealthy diet. When asked about meal planning, she reported none. She has had a previous visit with a dietitian. She participates in exercise intermittently. Her home blood glucose trend is decreasing  steadily. Her breakfast blood glucose range is generally 130-140 mg/dl. Her lunch blood glucose range is generally 180-200 mg/dl. Her bedtime blood glucose range is generally 180-200 mg/dl. Her overall blood glucose range is 180-200 mg/dl. (She presents with infrequent monitoring of blood glucose, near target fasting, significantly above target postprandial glycemic profile.  Her A1c is 8.3%, unchanged from her last time A1c.   She did not tolerate glipizide twice a day causing mild hypoglycemia. ) An ACE inhibitor/angiotensin II receptor blocker is being taken. Eye exam is current.  Hypertension This is a chronic problem. The current episode started more than 1 year ago. The problem is controlled. Pertinent negatives include no chest pain, headaches, palpitations or shortness of breath. Risk factors for coronary artery disease include diabetes mellitus, dyslipidemia, obesity, sedentary lifestyle, smoking/tobacco exposure and post-menopausal state. Past treatments include ACE inhibitors and beta blockers. Hypertensive end-organ damage includes CVA.  Hyperlipidemia This is a chronic problem. The current episode started more than 1 year ago. The problem is uncontrolled. Exacerbating diseases include diabetes and obesity. Pertinent negatives include no chest pain, myalgias or shortness of breath. Risk factors for coronary artery disease include dyslipidemia, diabetes mellitus, hypertension, obesity, a sedentary lifestyle and post-menopausal.     Review of Systems  Constitutional: Positive for fatigue. Negative for chills, fever and unexpected weight change.  HENT: Negative for trouble swallowing and voice change.   Eyes: Negative for visual disturbance.  Respiratory: Negative for cough, shortness of breath and wheezing.   Cardiovascular: Negative for chest pain, palpitations and leg swelling.  Gastrointestinal: Negative for diarrhea, nausea and vomiting.  Endocrine: Negative for cold intolerance, heat  intolerance, polydipsia, polyphagia and polyuria.  Musculoskeletal: Negative for arthralgias and myalgias.  Skin: Negative for color change, pallor, rash and wound.  Neurological: Negative for seizures and headaches.  Psychiatric/Behavioral: Negative for confusion and suicidal ideas.    Objective:    Vitals with BMI 12/26/2020 11/02/2020 10/10/2020  Height 5' 5.5" 5' 5.5" -  Weight 248 lbs 6 oz 245 lbs -  BMI 10.17 51.02 -  Systolic 585 277 824  Diastolic 83 80 58  Pulse 80 78 78    BP (!) 143/83   Pulse 80   Ht 5' 5.5" (1.664 m)   Wt 248 lb 6.4 oz (112.7 kg)   LMP 04/15/2015 (LMP Unknown) Comment:  last in may  BMI 40.71 kg/m   Wt Readings from Last 3 Encounters:  12/26/20 248 lb 6.4 oz (112.7 kg)  11/02/20 245 lb (111.1 kg)  09/24/20 246 lb 3.2 oz (111.7 kg)     Physical Exam Constitutional:      Appearance: She is well-developed.  HENT:     Head: Normocephalic and atraumatic.  Neck:     Thyroid: No thyromegaly.     Trachea: No tracheal deviation.  Cardiovascular:     Rate and Rhythm: Normal rate and regular rhythm.  Pulmonary:     Effort: Pulmonary effort is normal.  Abdominal:     Tenderness: There is no abdominal tenderness. There is no guarding.  Musculoskeletal:        General: Normal range of motion.     Cervical back: Normal range of motion and neck supple.  Skin:    General: Skin is warm and dry.     Coloration: Skin is not pale.     Findings: No erythema or rash.  Neurological:     Mental Status: She is alert and oriented to person, place, and time.     Cranial Nerves: No cranial nerve deficit.     Coordination: Coordination normal.     Deep Tendon Reflexes: Reflexes are normal and symmetric.     Comments: Diminished monofilament test sensation on bilateral lower extremities. Her dorsalis pedis and posterior tibial arterial pulses are normal.  Psychiatric:        Judgment: Judgment normal.       CMP ( most recent) CMP     Component Value  Date/Time   NA 139 12/25/2020 0802   K 4.2 12/25/2020 0802   CL 104 12/25/2020 0802   CO2 20 12/25/2020 0802   GLUCOSE 141 (H) 12/25/2020 0802   GLUCOSE 155 (H) 05/21/2020 0900   BUN 10 12/25/2020 0802   CREATININE 0.58 12/25/2020 0802   CREATININE 0.90 06/16/2016 1128   CALCIUM 8.8 12/25/2020 0802   PROT 6.8 12/25/2020 0802   ALBUMIN 3.9 12/25/2020 0802   AST 18 12/25/2020 0802   ALT 34 (H) 12/25/2020 0802   ALKPHOS 67 12/25/2020 0802   BILITOT 0.4 12/25/2020 0802   GFRNONAA 103 12/25/2020 0802   GFRNONAA 73 06/16/2016 1128   GFRAA 118 12/25/2020 0802   GFRAA 84 06/16/2016 1128     Diabetic Labs (most recent): Lab Results  Component Value Date   HGBA1C 8.3 (A) 09/24/2020   HGBA1C 8.5 (A) 06/20/2020   HGBA1C 9.1 (A) 04/18/2020     Lipid Panel ( most recent) Lipid Panel     Component Value Date/Time   CHOL 115 12/25/2020 0802   TRIG 208 (H) 12/25/2020 0802   HDL 32 (L) 12/25/2020 0802   CHOLHDL 3.6 12/25/2020 0802   CHOLHDL 5.2 (H) 11/19/2016 1103   VLDL 59 (H) 11/19/2016 1103   LDLCALC 49 12/25/2020 0802   LDLDIRECT 114.0 02/13/2015 0750   LABVLDL 34 12/25/2020 0802      Lab Results  Component Value Date   TSH 0.597 12/25/2020   TSH 1.01 09/16/2019   TSH 0.718 12/26/2015   TSH 0.950 09/15/2015   TSH 0.95 11/13/2014   TSH 1.020 10/25/2013   FREET4 0.99 12/25/2020   FREET4 0.98 12/26/2015   FREET4 0.98 10/25/2013      Assessment & Plan:   1. DM type 2 causing vascular disease ([place]) - Kelly Lang has currently uncontrolled symptomatic type 2 DM since  60 years of age.  She presents with infrequent monitoring of blood glucose, near target fasting, significantly above target postprandial glycemic profile.  Her A1c is 8.3%, unchanged from her last time A1c.   She did not tolerate glipizide twice a day causing mild hypoglycemia.   - I had a long discussion with her about the progressive nature of diabetes and the pathology behind its  complications. -her diabetes is complicated by CVA, peripheral neuropathy and she remains at a high risk for more acute and chronic complications which include CAD, CVA, CKD, retinopathy, and neuropathy. These are all discussed in detail with her.  - I have counseled her on diet  and weight management  by adopting a carbohydrate restricted/protein rich diet. Patient is encouraged to switch to  unprocessed or minimally processed     complex starch and increased protein intake (animal or plant source), fruits, and vegetables. -  she is advised to stick to a routine mealtimes to eat 3 meals  a day and avoid unnecessary snacks ( to snack only to correct hypoglycemia).   - she acknowledges that there is a room for improvement in her food and drink choices. - Suggestion is made for her to avoid simple carbohydrates  from her diet including Cakes, Sweet Desserts, Ice Cream, Soda (diet and regular), Sweet Tea, Candies, Chips, Cookies, Store Bought Juices, Alcohol in Excess of  1-2 drinks a day, Artificial Sweeteners,  Coffee Creamer, and "Sugar-free" Products, Lemonade. This will help patient to have more stable blood glucose profile and potentially avoid unintended weight gain.   - she reports that she has had enough encounters with dietitian in the past.    - I have approached her with the following individualized plan to manage  her diabetes and patient agrees:   - she is advised to continue Lantus 80 units nightly, continue to monitor blood glucose twice a day -daily before breakfast and at bedtime.   -She will not need prandial insulin for now.  - she is warned not to take insulin without proper monitoring per orders.  - she is encouraged to call clinic for blood glucose levels less than 70 or above 200 mg /dl. - she is advised to continue Metformin 1000 mg p.o. twice daily- therapeutically suitable for patient . -She will also continue to benefit from [place].  She is advised to continue Ozempic 1  mg subcutaneously weekly. -She will be continued on glipizide 5 mg XL p.o. daily at breakfast in lieu of prandial insulin.  - Specific targets for  A1c;  LDL, HDL,  and Triglycerides were discussed with the patient.  2) Blood Pressure /Hypertension: Her blood pressure is not controlled to target. she is advised to continue her current medications including lisinopril 10 mg p.o. daily, metoprolol 25 mg p.o. twice daily. 3) Lipids/Hyperlipidemia:   Review of her recent lipid panel showed  uncontrolled  LDL at 76-114 .  she  is advised to continue atorvastatin 40 mg p.o. daily at bedtime.   Side effects and precautions discussed with her.  4)  Weight/Diet:  Body mass index is 40.71 kg/m.  -   clearly complicating her diabetes care.   she is  a candidate for weight loss. I discussed with her the fact that loss of 5 - 10% of her  current body weight will have the most impact on her diabetes management.  Exercise, and detailed carbohydrates information provided  -  detailed on discharge instructions. She will benefit from bariatric weight management, which  is briefly discussed  with her.  She is given information brochure and contact information for [facility] bariatric team.    5) Chronic Care/Health Maintenance:  -she  is on ACEI/ARB and Statin medications and  is encouraged to initiate and continue to follow up with Ophthalmology, Dentist,  Podiatrist at least yearly or according to recommendations, and advised to   stay away from smoking. I have recommended yearly flu vaccine and pneumonia vaccine at least every 5 years; moderate intensity exercise for up to 150 minutes weekly; and  sleep for at least 7 hours a day.   POCT ABI Results 12/26/20  Her ABIs normal today. Right ABI: 1.2      left ABI: 1.2  Right leg systolic / diastolic: 161/096 mmHg Left leg systolic / diastolic: 045/40 mmHg  Arm systolic / diastolic: 981/19 mmHG This study will be repeated in January 2027, or sooner if  needed.  - she is  advised to maintain close follow up with Ladell Pier, MD for primary care needs, as well as her other providers for optimal and coordinated care.   - Time spent on this patient care encounter:  35 min, of which > 50% was spent in  counseling and the rest reviewing her blood glucose logs , discussing her hypoglycemia and hyperglycemia episodes, reviewing her current and  previous labs / studies  ( including abstraction from other facilities) and medications  doses and developing a  long term treatment plan and documenting her care.   Please refer to Patient Instructions for Blood Glucose Monitoring and Insulin/Medications Dosing Guide"  in media tab for additional information. Please  also refer to " Patient Self Inventory" in the Media  tab for reviewed elements of pertinent patient history.  Kelly Lang participated in the discussions, expressed understanding, and voiced agreement with the above plans.  All questions were answered to her satisfaction. she is encouraged to contact clinic should she have any questions or concerns prior to her return visit.    Follow up plan: - Return in about 4 months (around 04/25/2021) for Bring Meter and Logs- A1c in Office.  Glade Lloyd, MD [facility] [address] [place], [place] [address] Phone: [phone]  Fax: [phone]    12/26/2020, 4:35 PM  This note was partially dictated with voice recognition software. Similar sounding words can be transcribed inadequately or may not  be corrected upon review.

## 2020-12-28 ENCOUNTER — Other Ambulatory Visit: Payer: Self-pay | Admitting: Internal Medicine

## 2020-12-28 DIAGNOSIS — IMO0002 Reserved for concepts with insufficient information to code with codable children: Secondary | ICD-10-CM

## 2020-12-28 DIAGNOSIS — E1165 Type 2 diabetes mellitus with hyperglycemia: Secondary | ICD-10-CM

## 2020-12-28 MED ORDER — METFORMIN HCL 1000 MG PO TABS: 1000.0000 mg | ORAL_TABLET | Freq: Two times a day (BID) | ORAL | 0 refills | Status: DC

## 2020-12-28 NOTE — Telephone Encounter (Signed)
Medication:  metFORMIN (GLUCOPHAGE) 1000 MG tablet [[id number]]   Has the patient contacted their pharmacy? Yes   (Agent: If yes, when and what did the pharmacy advise?)  Preferred Pharmacy (with phone number or street name): to call the office  [facility] [address] [place] - [address]  [address] [place] [address]  Phone: [phone] Fax: [phone]    Agent: Please be advised that RX refills may take up to 3 business days. We ask that you follow-up with your pharmacy.

## 2021-01-09 ENCOUNTER — Other Ambulatory Visit: Payer: Self-pay | Admitting: Internal Medicine

## 2021-01-09 DIAGNOSIS — E785 Hyperlipidemia, unspecified: Secondary | ICD-10-CM

## 2021-01-09 MED ORDER — ATORVASTATIN CALCIUM 40 MG PO TABS: 40.0000 mg | ORAL_TABLET | Freq: Every day | ORAL | 0 refills | Status: DC

## 2021-01-09 NOTE — Telephone Encounter (Signed)
Kelly Lang could you fill if appropriate

## 2021-01-22 ENCOUNTER — Telehealth: Payer: Self-pay

## 2021-01-22 DIAGNOSIS — E1159 Type 2 diabetes mellitus with other circulatory complications: Secondary | ICD-10-CM

## 2021-01-22 MED ORDER — OZEMPIC (1 MG/DOSE) 4 MG/3ML ~~LOC~~ SOPN: PEN_INJECTOR | SUBCUTANEOUS | 1 refills | Status: DC

## 2021-01-22 NOTE — Telephone Encounter (Signed)
Patient is requesting a new RX sent to to a new pharmacy. [facility] [address], [place]. Rx is Ozempic.

## 2021-01-22 NOTE — Telephone Encounter (Signed)
Rx sent 

## 2021-02-05 ENCOUNTER — Ambulatory Visit: Payer: 59 | Attending: Internal Medicine | Admitting: Internal Medicine

## 2021-02-05 ENCOUNTER — Encounter: Payer: Self-pay | Admitting: Internal Medicine

## 2021-02-05 ENCOUNTER — Other Ambulatory Visit: Payer: Self-pay

## 2021-02-05 VITALS — BP 145/84 | HR 79 | Resp 16 | Wt 237.8 lb

## 2021-02-05 DIAGNOSIS — G8929 Other chronic pain: Secondary | ICD-10-CM | POA: Diagnosis not present

## 2021-02-05 DIAGNOSIS — M65351 Trigger finger, right little finger: Secondary | ICD-10-CM

## 2021-02-05 DIAGNOSIS — M65342 Trigger finger, left ring finger: Secondary | ICD-10-CM

## 2021-02-05 DIAGNOSIS — M545 Low back pain, unspecified: Secondary | ICD-10-CM

## 2021-02-05 DIAGNOSIS — M79642 Pain in left hand: Secondary | ICD-10-CM

## 2021-02-05 DIAGNOSIS — I5022 Chronic systolic (congestive) heart failure: Secondary | ICD-10-CM

## 2021-02-05 DIAGNOSIS — M79641 Pain in right hand: Secondary | ICD-10-CM

## 2021-02-05 DIAGNOSIS — M2550 Pain in unspecified joint: Secondary | ICD-10-CM

## 2021-02-05 DIAGNOSIS — Z8679 Personal history of other diseases of the circulatory system: Secondary | ICD-10-CM

## 2021-02-05 DIAGNOSIS — E1169 Type 2 diabetes mellitus with other specified complication: Secondary | ICD-10-CM

## 2021-02-05 DIAGNOSIS — E669 Obesity, unspecified: Secondary | ICD-10-CM

## 2021-02-05 MED ORDER — DULOXETINE HCL 30 MG PO CPEP: 30.0000 mg | ORAL_CAPSULE | Freq: Every day | ORAL | 1 refills | Status: DC

## 2021-02-05 NOTE — Progress Notes (Signed)
Patient ID: Kelly Lang, female    DOB: 1961-03-01  MRN: [id number]  CC: chronic ds management  Subjective: Kelly Lang is a 60 y.o. female who presents for chronic ds management Her concerns today include: Pt with hx of a.flutters/p ablation7/2018on Xarelto, DM type 2 ,gastroparesis,obesity, HL, HTN, chronic systolic CHF with HU31-49%, CVA, polyarthalgia on Cymbalta, psoriasis, COVID infection 01/2020, relative B12 def.  Main concern today is arthritis pains in her back and pain in her hands.   Complains of locking of the right fifth finger and left ring finger intermittently.  When this happens she has to use her other hand to straighten the fingers out.  It is very painful.  And the left ring finger hurts worse than the right fifth finger.   Mild pain in wrists.  The hands and feet feel stiff in the mornings.  The stiffness lasts about 15 to 20 minutes.  Denies any swelling in the joints of the hands/wrists.  Pain in the back is in the midline from the mid thoracic spine to the lower lumbar region. -Pain is constant.  It does not radiate.  Worse with walking, when she tries to sit down in a seat that sits too low, when bending over and at nights.  Pain sometimes interferes with sleep.  The back does not feel stiff in the mornings, she says it just hurts.  She rides a stationary bike 5 days a week for about 30 minutes.  Back does not bother her when she is cycling..  Rates pain 6/10 Not wanting to go to work in mornings due to pain.  She uses Voltaren gel and takes Tylenol but neither of those seem to be helping anymore.  She is also on Cymbalta.  .  DM/obesity: Saw endocrinologist Dr. Dorris Fetch about 6 weeks ago.  Lantus down to 80 units, Glipizide down to 1 pill a day.  Still on Metformin and Ozempic BS 90-140 before meals Doing much better with eating habits. Watching carbs, cut back on portion sizes, drinks mainly water, no sodas or tea.   Loss 10 lbs since January of this yr.    HYPERTENSION/CHF/A.fib Currently taking: see medication list.  She is on Xarelto, lisinopril, metoprolol, Med Adherence: '[x]'  Yes    '[]'  No Medication side effects: '[]'  Yes    '[x]'  No Adherence with salt restriction: '[x]'  Yes    '[]'  No Home Monitoring?: '[]'  Yes    '[]'  No Monitoring Frequency: '[]'  Yes    '[]'  No Home BP results range: '[]'  Yes    '[]'  No SOB? '[]'  Yes    '[x]'  No Chest Pain?: '[]'  Yes    '[x]'  No Leg swelling?: '[]'  Yes    '[x]'  No Headaches?: '[]'  Yes    '[x]'  No Dizziness? '[]'  Yes    '[x]'  No Comments: no palpitations.  No bruising or bleeding  Patient Active Problem List   Diagnosis Date Noted  . Mixed hyperlipidemia 09/17/2020  . Multinodular goiter 09/16/2019  . Chronic pain disorder 08/09/2018  . Diabetic polyneuropathy associated with type 2 diabetes mellitus ([place]) 08/09/2018  . Gastroparesis 12/21/2017  . GI bleed 12/06/2017  . Hepatic steatosis 12/04/2017  . Chronic systolic CHF (congestive heart failure) ([place]) 09/16/2015  . Cerebrovascular accident (CVA) due to embolism of left middle cerebral artery ([place])   . Low back pain 04/03/2014  . Vitamin D deficiency 01/20/2014  . Essential hypertension, benign 01/20/2014  . Chronic anticoagulation 10/31/2013  . Tachycardia induced cardiomyopathy ([place])   . H/O:  hypothyroidism   . Class 3 severe obesity with serious comorbidity and body mass index (BMI) of 40.0 to 44.9 in adult [facility])   . DM type 2 causing vascular disease ([place])   . Arthritis   . Dyslipidemia   . Atrial fibrillation  10/25/2013     Current Outpatient Medications on File Prior to Visit  Medication Sig Dispense Refill  . atorvastatin (LIPITOR) 40 MG tablet Take 1 tablet (40 mg total) by mouth daily. 90 tablet 0  . Blood Glucose Monitoring Suppl ([place]) w/Device KIT Use as instructed to check blood sugar up to 3 times daily. 1 kit 0  . diclofenac sodium (VOLTAREN) 1 % GEL APPLY 2 GRAMS TOPICALLY 4 (FOUR) TIMES DAILY AS NEEDED (PAIN). 100 g 5  .  DULoxetine (CYMBALTA) 20 MG capsule TAKE 1 CAPSULE (20 MG TOTAL) BY MOUTH DAILY. 90 capsule 0  . glipiZIDE (GLUCOTROL XL) 5 MG 24 hr tablet Take 1 tablet (5 mg total) by mouth daily with breakfast. 90 tablet 1  . insulin glargine (LANTUS SOLOSTAR) 100 UNIT/ML Solostar Pen Inject 80 Units into the skin at bedtime. 30 mL 2  . lisinopril (ZESTRIL) 10 MG tablet Take 1 tablet (10 mg total) by mouth daily. 90 tablet 2  . metFORMIN (GLUCOPHAGE) 1000 MG tablet Take 1 tablet (1,000 mg total) by mouth 2 (two) times daily with a meal. 180 tablet 0  . metoCLOPramide (REGLAN) 5 MG tablet Take 1 tablet (5 mg total) by mouth 3 (three) times daily before meals. 90 tablet 11  . metoprolol tartrate (LOPRESSOR) 25 MG tablet Take 1.5 tablets (37.5 mg total) by mouth 2 (two) times daily. Please make yearly appt with Dr. Acie Fredrickson for November for future refills. 1st attempt 270 tablet 0  . metroNIDAZOLE (METROCREAM) 0.75 % cream APPLY THIN LAYER TO FACE TWICE DAILY (Patient not taking: Reported on 02/05/2021)    . omeprazole (PRILOSEC) 20 MG capsule Take 1 capsule (20 mg total) by mouth daily. 30 capsule 11  . ONETOUCH DELICA LANCETS 29V MISC Use as instructed to check blood sugar up to 3 times daily. 100 each 11  . ONETOUCH VERIO test strip USE STRIPS TO CHECK GLUCOSE UP TO THREE TIMES DAILY AS DIRECTED 100 each 6  . OZEMPIC, 1 MG/DOSE, 4 MG/3ML SOPN INJECT 1 MG INTO THE SKIN ONCE A WEEK 9 mL 1  . SKYRIZI, 150 MG DOSE, 75 MG/0.83ML PSKT Inject 150 mg as directed See admin instructions. 4x's per year    . TRUEPLUS PEN NEEDLES 32G X 4 MM MISC USE AS DIRECTED AT BEDTIME. 100 each 2  . Vitamin D, Ergocalciferol, (DRISDOL) 1.25 MG (50000 UNIT) CAPS capsule Take 1 capsule (50,000 Units total) by mouth every 7 (seven) days. 12 capsule 0  . XARELTO 20 MG TABS tablet TAKE 1 TABLET BY MOUTH DAILY WITH SUPPER. 30 tablet 5   No current facility-administered medications on file prior to visit.    Allergies  Allergen Reactions  .  Jardiance [Empagliflozin] Other (See Comments)    Yeast infections  . Codeine Itching and Rash    Social History   Socioeconomic History  . Marital status: Divorced    Spouse name: Not on file  . Number of children: 0  . Years of education: 54  . Highest education level: Not on file  Occupational History  . Occupation: Chief Technology Officer  Tobacco Use  . Smoking status: Former Smoker    Packs/day: 1.00    Years: 22.00  Pack years: 22.00    Types: Cigarettes    Quit date: 12/01/2010    Years since quitting: 10.1  . Smokeless tobacco: Never Used  Vaping Use  . Vaping Use: Never used  Substance and Sexual Activity  . Alcohol use: Yes    Comment: social  . Drug use: No  . Sexual activity: Not Currently  Other Topics Concern  . Not on file  Social History Narrative   Moved to [place] from [place] in 2002.     Fun: shop, sew, decorate   Denies any beliefs effecting healthcare   Social Determinants of Health   Financial Resource Strain: Not on file  Food Insecurity: Not on file  Transportation Needs: Not on file  Physical Activity: Not on file  Stress: Not on file  Social Connections: Not on file  Intimate Partner Violence: Not on file    Family History  Adopted: Yes  Problem Relation Age of Onset  . Hypertension Mother   . Hyperlipidemia Mother     Past Surgical History:  Procedure Laterality Date  . ATRIAL FIBRILLATION ABLATION  06/30/2017  . ATRIAL FIBRILLATION ABLATION N/A 06/30/2017   Procedure: Atrial Fibrillation Ablation;  Surgeon: Thompson Grayer, MD;  Location: [place] [facility];  Service: Cardiovascular;  Laterality: N/A;  . BICEPT TENODESIS Right 05/24/2020   Procedure: BICEPS TENODESIS;  Surgeon: Meredith Pel, MD;  Location: [place];  Service: Orthopedics;  Laterality: Right;  . BIOPSY THYROID  1996  . CARDIOVERSION N/A 12/07/2013   Procedure: CARDIOVERSION;  Surgeon: Casandra Doffing, MD;  Location: [place];  Service:  Cardiovascular;  Laterality: N/A;  . CARDIOVERSION N/A 03/10/2016   Procedure: CARDIOVERSION;  Surgeon: Thayer Headings, MD;  Location: [facility] ENDOSCOPY;  Service: Cardiovascular;  Laterality: N/A;  . ESOPHAGOGASTRODUODENOSCOPY N/A 12/07/2017   Procedure: ESOPHAGOGASTRODUODENOSCOPY (EGD);  Surgeon: Irene Shipper, MD;  Location: [facility] ENDOSCOPY;  Service: Endoscopy;  Laterality: N/A;  . EYE MUSCLE SURGERY Right ~ 1966  . SHOULDER ARTHROSCOPY WITH ROTATOR CUFF REPAIR Right 05/24/2020   Procedure: right shoulder arthroscopy, rotator cuff tear repair biceps tenodesis;  Surgeon: Meredith Pel, MD;  Location: [place];  Service: Orthopedics;  Laterality: Right;  . TEE WITHOUT CARDIOVERSION N/A 06/29/2017   Procedure: TRANSESOPHAGEAL ECHOCARDIOGRAM (TEE);  Surgeon: Fay Records, MD;  Location: [facility] ENDOSCOPY;  Service: Cardiovascular;  Laterality: N/A;  . TONSILLECTOMY  1971    ROS: Review of Systems Negative except as stated above  PHYSICAL EXAM: BP (!) 145/84   Pulse 79   Resp 16   Wt 237 lb 12.8 oz (107.9 kg)   LMP 04/15/2015 (LMP Unknown) Comment: last in may  SpO2 96%   BMI 38.97 kg/m   Wt Readings from Last 3 Encounters:  02/05/21 237 lb 12.8 oz (107.9 kg)  12/26/20 248 lb 6.4 oz (112.7 kg)  11/02/20 245 lb (111.1 kg)    Physical Exam  General appearance - alert, well appearing, and in no distress Mental status -patient tearful when describing her joint pains.   Neck - supple, no significant adenopathy Chest - clear to auscultation, no wheezes, rales or rhonchi, symmetric air entry Heart - normal rate, regular rhythm, normal S1, S2, no murmurs, rubs, clicks or gallops Musculoskeletal -mild tenderness on palpation thoracic and lumbar spine starting at about the mid thoracic level.  No tenderness on palpation of the thoracic and lumbar paraspinal muscles.  Straight leg raise negative both sides.  No edema or erythema noted of  the wrists.  She has good range of motion of  both wrists.  Slight enlargement of the MCP joint of the left ring finger.  No other signs of tenosynovitis in the hands.  Ankles are not swollen.  She has good range of motion of the ankle joints. Extremities -no lower extremity edema.  CMP Latest Ref Rng & Units 12/25/2020 08/16/2020 05/21/2020  Glucose 65 - 99 mg/dL 141(H) - 155(H)  BUN 6 - 24 mg/dL 10 - 14  Creatinine 0.57 - 1.00 mg/dL 0.58 - 0.74  Sodium 134 - 144 mmol/L 139 - 139  Potassium 3.5 - 5.2 mmol/L 4.2 - 3.9  Chloride 96 - 106 mmol/L 104 - 104  CO2 20 - 29 mmol/L 20 - 25  Calcium 8.7 - 10.2 mg/dL 8.8 - 9.2  Total Protein 6.0 - 8.5 g/dL 6.8 6.9 -  Total Bilirubin 0.0 - 1.2 mg/dL 0.4 0.3 -  Alkaline Phos 44 - 121 IU/L 67 82 -  AST 0 - 40 IU/L 18 12 -  ALT 0 - 32 IU/L 34(H) 21 -   Lipid Panel     Component Value Date/Time   CHOL 115 12/25/2020 0802   TRIG 208 (H) 12/25/2020 0802   HDL 32 (L) 12/25/2020 0802   CHOLHDL 3.6 12/25/2020 0802   CHOLHDL 5.2 (H) 11/19/2016 1103   VLDL 59 (H) 11/19/2016 1103   LDLCALC 49 12/25/2020 0802   LDLDIRECT 114.0 02/13/2015 0750    CBC    Component Value Date/Time   WBC 7.5 08/16/2020 0941   WBC 8.0 05/21/2020 0900   RBC 4.52 08/16/2020 0941   RBC 4.49 05/21/2020 0900   HGB 14.9 08/16/2020 0941   HCT 42.4 08/16/2020 0941   PLT 276 08/16/2020 0941   MCV 94 08/16/2020 0941   MCH 33.0 08/16/2020 0941   MCH 32.5 05/21/2020 0900   MCHC 35.1 08/16/2020 0941   MCHC 33.3 05/21/2020 0900   RDW 11.9 08/16/2020 0941   LYMPHSABS 1.7 11/25/2017 1017   MONOABS 704 03/07/2016 0904   EOSABS 0.3 11/25/2017 1017   BASOSABS 0.1 11/25/2017 1017    ASSESSMENT AND PLAN: 1. Chronic midline low back pain without sciatica Last imaging study was done on the lumbar spine in 2015 revealing some degenerative changes.  Likely symptoms due to progression of degenerative disc or arthritis.  We will get some updated x-rays.  I recommend referral for some physical therapy.  We discussed pain medication.   She is allergic to codeine which means she would also probably have a reaction to tramadol.  She is not interested in having narcotics like Vicodin or Percocet.  I recommend that we try increasing the dose of Cymbalta from 20 mg daily to 30 mg daily.  Patient was agreeable to this. - Ambulatory referral to Physical Therapy - DG Lumbar Spine Complete; Future - DG Thoracic Spine W/Swimmers; Future  2. Trigger ring finger of left hand I gave her the option of trying a muscle relaxant versus referral to orthopedics.  Patient opted for the latter. - Ambulatory referral to Orthopedic Surgery  3. Trigger little finger of right hand #2 above - Ambulatory referral to Orthopedic Surgery  4. Pain in both hands Doubt inflammatory arthritis but given her symptoms, I think it is worth checking rheumatoid factor and anti-CCP. - Rheumatoid factor - CYCLIC CITRUL PEPTIDE ANTIBODY, IGG/IGA  5. Polyarthralgia - DULoxetine (CYMBALTA) 30 MG capsule; Take 1 capsule (30 mg total) by mouth daily.  Dispense: 90 capsule; Refill: 1  6. 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([place]) Reported blood sugars before meals are close to goal. She will continue her current diabetic medications.  Commended her on changes that she has made in her eating habits and weight loss that she has achieved so far.  Encouraged her to keep up the good work.  Encouraged her to continue regular exercise.  7. Chronic systolic CHF (congestive heart failure) ([place]) Stable and decompensated at this time.  8. History of atrial fibrillation Sounds to be in sinus rhythm.  Tolerating Xarelto without bruising or bleeding.  She is followed by cardiology.    Patient was given the opportunity to ask questions.  Patient verbalized understanding of the plan and was able to repeat key elements of the plan.   No orders of the defined types were placed in this encounter.    Requested Prescriptions    No prescriptions requested or ordered in this  encounter    No follow-ups on file.  Karle Plumber, MD, FACP

## 2021-02-05 NOTE — Patient Instructions (Signed)
Increase Cymbalta to 30 mg daily. Take Tylenol 500 mg 1 tablet 3 times a day as needed. Please go to the [facility] department at [facility] to have the x-rays done on your back.  Pending the results we can consider referring to a pain specialist.  I have referred you for some physical therapy.

## 2021-02-05 NOTE — Progress Notes (Signed)
Pt states she is unable to close her hand.   Pt states the creame not helping   Pt states she can't sleep because of the pain

## 2021-02-06 ENCOUNTER — Telehealth: Payer: 59 | Admitting: Physician Assistant

## 2021-02-07 ENCOUNTER — Ambulatory Visit (HOSPITAL_COMMUNITY)
Admission: RE | Admit: 2021-02-07 | Discharge: 2021-02-07 | Disposition: A | Discharge status: Home / Self Care | Payer: 59 | Source: Ambulatory Visit | Attending: Internal Medicine | Admitting: Internal Medicine

## 2021-02-07 ENCOUNTER — Other Ambulatory Visit: Payer: Self-pay

## 2021-02-07 DIAGNOSIS — M545 Low back pain, unspecified: Secondary | ICD-10-CM | POA: Insufficient documentation

## 2021-02-07 DIAGNOSIS — G8929 Other chronic pain: Secondary | ICD-10-CM | POA: Diagnosis present

## 2021-02-07 LAB — RHEUMATOID FACTOR: Rheumatoid fact SerPl-aCnc: <10 IU/mL (ref <14.0)

## 2021-02-07 LAB — CYCLIC CITRUL PEPTIDE ANTIBODY, IGG/IGA: Cyclic Citrullin Peptide Ab: 5 units (ref 0–19)

## 2021-02-13 ENCOUNTER — Ambulatory Visit: Payer: Self-pay

## 2021-02-13 ENCOUNTER — Other Ambulatory Visit: Payer: Self-pay

## 2021-02-13 ENCOUNTER — Ambulatory Visit (INDEPENDENT_AMBULATORY_CARE_PROVIDER_SITE_OTHER): Payer: 59 | Admitting: Orthopedic Surgery

## 2021-02-13 DIAGNOSIS — M65342 Trigger finger, left ring finger: Secondary | ICD-10-CM

## 2021-02-16 ENCOUNTER — Encounter: Payer: Self-pay | Admitting: Orthopedic Surgery

## 2021-02-16 NOTE — Progress Notes (Signed)
Office Visit Note   Patient: Kelly Lang           Date of Birth: [birth year]           MRN: [id number] Visit Date: 02/13/2021 Requested by: Marcine Matar, MD [address] [place],  [place] [address] PCP: Marcine Matar, MD  Subjective: Chief Complaint  Patient presents with   Other     Trigger finger    HPI: Kelly Lang is a 59 y.o. female who presents to the office complaining of bilateral hand trigger finger.  Patient states that in the last 2 months she has had worsening triggering of her left ring finger and right small finger.  She has had 1 trigger finger in the past that resolved spontaneously without any intervention.  She notes decreased grip strength due to severe pain from the trigger fingers.  Left ring finger bothers her the most.  Denies any history of injury to the hand.  Denies any history of hand surgery.  She does have a history of diabetes with last A1c 8.2 which was in late January and she is due for recheck in April.  Her blood glucose in the morning usually runs about 1 30-1 40.  No history of carpal tunnel syndrome.  Reports that her right shoulder is doing well and does not cause her any significant pain.  She is able to lift her arm overhead with no significant difficulty..                ROS: All systems reviewed are negative as they relate to the chief complaint within the history of present illness.  Patient denies fevers or chills.  Assessment & Plan: Visit Diagnoses:  1. Trigger finger, left ring finger     Plan: Patient is a 60 year old female who presents complaint of bilateral hand trigger finger.  She has had worsening symptoms over the last 2 months without injury.  Left ring finger is the most troublesome trigger finger and is worst in the morning when she wakes up and it is stuck in a flexed position.  She does not have to use her contralateral hand to reduce the trigger fingers.  Discussed options available to patient, she would like  to try a trigger finger injection.  Her last A1c was 8.2 but her blood glucose is running about 1 30-1 40 and she is maintaining better control over it recently in the past several months.  It is a small volume of cortisone but regardless, recommended patient continue to check her blood glucose daily and contact her PCP if it becomes too high.  A left ring finger trigger finger injection was administered successfully under ultrasound guidance.  Patient tolerated the procedure well.  Plan for patient follow-up as needed.  Discussed that we may inject this finger 1 more time but if symptoms recur after that, only option is living with it or A1 pulley release.  Follow-Up Instructions: No follow-ups on file.   Orders:  Orders Placed This Encounter  Procedures   US Guided Needle Placement - No Linked Charges   No orders of the defined types were placed in this encounter.     Procedures: Hand/UE Inj: L ring A1 for trigger finger on 02/17/2021 10:11 PM Indications: therapeutic Details: 25 G needle, ultrasound-guided volar approach Medications: 0.33 mL bupivacaine 0.25 %; 13.33 mg methylPREDNISolone acetate 40 MG/ML; 3 mL lidocaine 1 % Outcome: tolerated well, no immediate complications Procedure, treatment alternatives, risks and benefits explained, specific  risks discussed. Consent was given by the patient. Immediately prior to procedure a time out was called to verify the correct patient, procedure, equipment, support staff and site/side marked as required. Patient was prepped and draped in the usual sterile fashion.       Clinical Data: No additional findings.  Objective: Vital Signs: LMP 04/15/2015 (LMP Unknown) Comment: last in may  Physical Exam:  Constitutional: Patient appears well-developed HEENT:  Head: Normocephalic Eyes:EOM are normal Neck: Normal range of motion Cardiovascular: Normal rate Pulmonary/chest: Effort normal Neurologic: Patient is alert Skin: Skin is  warm Psychiatric: Patient has normal mood and affect  Ortho Exam: Ortho exam demonstrates tenderness over the A1 pulley of the left ring and right small fingers.  No other A1 pulley tenderness.  There is triggering that is observed of both fingers.  She is able to spontaneously reduce the trigger finger without using the contralateral hand.  No contractures noted throughout the palm.  Negative Tinel's sign.  Negative Phalen's sign.  Specialty Comments:  No specialty comments available.  Imaging: No results found.   PMFS History: Patient Active Problem List   Diagnosis Date Noted   Trigger ring finger of left hand 02/05/2021   Trigger little finger of right hand 02/05/2021   Mixed hyperlipidemia 09/17/2020   Multinodular goiter 09/16/2019   Chronic pain disorder 08/09/2018   Diabetic polyneuropathy associated with type 2 diabetes mellitus (HCC) 08/09/2018   Gastroparesis 12/21/2017   GI bleed 12/06/2017   Hepatic steatosis 12/04/2017   Chronic systolic CHF (congestive heart failure) (H[facility]) 09/16/2015   Cerebrovascular accident (CVA) due to 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)    Low back pain 04/03/2014   Vitamin D deficiency 01/20/2014   Essential hypertension, benign 01/20/2014   Chronic anticoagulation 10/31/2013   Tachycardia induced cardiomyopathy (HCC)    H/O: hypothyroidism    Class 3 severe obesity with serious comorbidity and body mass index (BMI) of 40.0 to 44.9 in adult [facility])    DM type 2 causing vascular disease ([facility])    Arthritis    Dyslipidemia    Atrial fibrillation  10/25/2013   Past Medical History:  Diagnosis Date   Arthritis    "all over" (06/30/2017)   Chicken pox    Dyslipidemia    Dysrhythmia ablation 2018   a-fib   GERD (gastroesophageal reflux disease)    H/O: hypothyroidism    a. thyroid biopsy 1996 with synthroid. Stopped taking rx and was loss to follow up b. normal thyroid function 10/20/13   High  cholesterol    Hx of cardiovascular stress test    ETT/Lexiscan Myoview (12/2013):  No ischemia; not gated; low risk.   Hypertension    Obesity    Persistent atrial fibrillation (HCC)    a diagnosed 10/25/2013   Psoriasis    on Skyrizi   Seasonal allergies    Stroke [facility]) 2016   "some speech problems since" (06/30/2017)   Tachycardia induced cardiomyopathy (HCC)    a. 2/2 Afib with RVR for unknown duration (EF: 40-45%)   Thyroid disease    Type II diabetes mellitus (HCC)    a. hg A1c 10, newly diagnosed (10/26/13)    Family History  Adopted: Yes  Problem Relation Age of Onset   Hypertension Mother    Hyperlipidemia Mother     Past Surgical History:  Procedure Laterality Date   ATRIAL FIBRILLATION ABLATION  06/30/2017   ATRIAL FIBRILLATION ABLATION N/A 06/30/2017   Procedure: Atrial Fibrillation Ablation;  Surgeon: Johney Frame,  Fayrene Fearing, MD;  [facility]: [facility];  Service: Cardiovascular;  Laterality: N/A;   BICEPT TENODESIS Right 05/24/2020   Procedure: BICEPS TENODESIS;  Surgeon: Cammy Copa, MD;  Location: [facility];  Service: Orthopedics;  Laterality: Right;   BIOPSY THYROID  1996   CARDIOVERSION N/A 12/07/2013   Procedure: CARDIOVERSION;  Surgeon: Everette Rank, MD;  Location: [facility] ENDOSCOPY;  Service: Cardiovascular;  Laterality: N/A;   CARDIOVERSION N/A 03/10/2016   Procedure: CARDIOVERSION;  Surgeon: Vesta Mixer, MD;  Location: [facility] ENDOSCOPY;  Service: Cardiovascular;  Laterality: N/A;   ESOPHAGOGASTRODUODENOSCOPY N/A 12/07/2017   Procedure: ESOPHAGOGASTRODUODENOSCOPY (EGD);  Surgeon: Hilarie Fredrickson, MD;  Location: [facility] ENDOSCOPY;  Service: Endoscopy;  Laterality: N/A;   EYE MUSCLE SURGERY Right ~ 1966   SHOULDER ARTHROSCOPY WITH ROTATOR CUFF REPAIR Right 05/24/2020   Procedure: right shoulder arthroscopy, rotator cuff tear repair biceps tenodesis;  Surgeon: Cammy Copa, MD;  Location: [facility];  Service:  Orthopedics;  Laterality: Right;   TEE WITHOUT CARDIOVERSION N/A 06/29/2017   Procedure: TRANSESOPHAGEAL ECHOCARDIOGRAM (TEE);  Surgeon: Pricilla Riffle, MD;  Location: [facility] ENDOSCOPY;  Service: Cardiovascular;  Laterality: N/A;   TONSILLECTOMY  1971   Social History   Occupational History   Occupation: Designer, television/film set  Tobacco Use   Smoking status: Former Smoker    Packs/day: 1.00    Years: 22.00    Pack years: 22.00    Types: Cigarettes    Quit date: 12/01/2010    Years since quitting: 10.2   Smokeless tobacco: Never Used  Vaping Use   Vaping Use: Never used  Substance and Sexual Activity   Alcohol use: Yes    Comment: social   Drug use: No   Sexual activity: Not Currently

## 2021-02-17 ENCOUNTER — Encounter: Payer: Self-pay | Admitting: Orthopedic Surgery

## 2021-02-17 DIAGNOSIS — M65342 Trigger finger, left ring finger: Secondary | ICD-10-CM

## 2021-02-17 MED ORDER — LIDOCAINE HCL 1 % IJ SOLN
3.0000 mL | INTRAMUSCULAR | Status: AC | PRN
  Administered 2021-02-17: 3 mL

## 2021-02-17 MED ORDER — METHYLPREDNISOLONE ACETATE 40 MG/ML IJ SUSP
13.3300 mg | INTRAMUSCULAR | Status: AC | PRN
  Administered 2021-02-17: 13.33 mg

## 2021-02-17 MED ORDER — BUPIVACAINE HCL 0.25 % IJ SOLN
0.3300 mL | INTRAMUSCULAR | Status: AC | PRN
  Administered 2021-02-17: .33 mL

## 2021-02-25 NOTE — Progress Notes (Signed)
Cardiology Office Note   Date:  02/26/2021   ID:  Kelly Lang, DOB Jul 08, 1961, MRN [id number]  PCP:  Ladell Pier, MD  Cardiologist:   Mertie Moores, MD   Chief Complaint  Patient presents with  . Atrial Fibrillation   1. Paroxysmal Atrial fibrillation 2. Hypothyroidism 3. Type 2 diabetes mellitus 4. Chronic systolic CHF    Kelly Lang is a 60 y.o. female with a hx of T2DM, HL, thyroid disease and atrial fibrillation. She had been on Synthroid for some time after a thyroid bx in 1996. She was admitted 10/2013 with AFib with RVR. Echo (10/26/13): EF 40-45%, normal wall motion, mild LAE. CHADS2-VASc= 3 (LV dysfunction, DM, female). She was placed on coumadin. 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 She was last seen 11/29/2013. She was set up for DCCV after 3-4 weeks of appropriate anticoagulation. DCCV was performed 12/07/2013 but was unsuccessful. She returns for follow up.  She is stable. She is frustrated with her atrial fibrillation. She does get tired more easily. She denies chest pain. She denies significant dyspnea. She is NYHA class II. She denies syncope. She denies orthopnea, PND, edema, palpitations.  Feb. 16, 2015:  She had a cardioversion twice. Converted back to Afib both times. She has had a stress myoview which was negative for ischemia. we will start flecainide today.  February 15, 2014:  Kelly Lang returns for further evaluation of her atrial fib. She has been on Flecainide 100 BID for a month. She started feeling better about 2 weeks ago. She has converted to NSR. 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   Sept. 16, 2015:  Kelly Lang is doing well. 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    February 13, 2015:  Kelly Lang is a 60 y.o. female who presents for follow-up of atrial fibrillation. Is on coumadin . INR is 2.0 this am.   No CP or dyspnea.    Glucose levels are ok.   HbA1C is a bit high . Was started on Tragenta. Only a little bit of exercise.   Missed 4 days of flecainide due to insurance not paying for it. Staying in NSR  Nov. 29, 2016:  Had a CVA in Oct, 2016.  Had not been on her coumadin and all of her medications.  Echo revealed severe LV dysfunction with EF of 20-25%.   Was started on Xarelto and metoprolol at that time   Has had some some chills and achy feeling for the past couple of days. No cough, no fever, no runny nose.  Cannot tell when she has atrial fib Not Exercising regularly  Works in [employer]	[place] of a hotel and also in [employer] room  Glucose levels are ok  Sleeping ok .     February 08, 2016:  Kelly Lang has been back on her meds.   She had stopped her medications and had developed worsening congestive heart failure. Previous echocardiogram revealed an EF of 25%. She restart her medications and the recent echo 2 days ago reveals recovery of her left ventricular systolic function to an ejection fraction 45% which is her normal.  She's feeling quite a bit better. She has taken flecainide in the past which helped her stay in normal sinus rhythm. She stopped the flecainide due to lack of insurance. She now has [employer] and wants to get back on flecainide.  March 07, 2016:  Kelly Lang is seen back today for follow-up of her atrial fibrillation. She's been on flecainide 100 mg twice a day.  She's had her stress test for flecainide testing and it revealed no QRS widening. She had an unsuccessful cardioversion Jan. 7, 2015 She cannot tell how much she is in NSR vs. Afib.   Oct. 9, 2017:  She is s/p cardioversion in April She is feeling better.  Able to walk without any dyspnea   May 18, 2017:  Kelly Lang is seen back today for follow up  No CP or dyspnea  Is now on Jardiance for the past week   Apr 12, 2018  Kelly Lang is seen for follow up visit  Has had an ablation since I last  Feels so much  better.    Able to walk but not getting any regular exercise  Still on Xarelto.  Body mass index is 39.88 kg/m.  October 05, 2019: Kelly Lang is seen today for follow-up visit.  He has a history of paroxysmal atrial fibrillation, obesity, hyperlipidemia and diabetes mellitus. Is not exercising much .  Working on getting diabetes regulated   Wt is 250 ( up 6 lbs)  Is s/p AFIB ablation    February 26, 2021:  Kelly Lang is seen today - hx of atrial fib, - s/p ablation Hx of HLD, DM, obesity Wt is 235 lbs   Chol levels are ok  Chol = 115 HDL = 32 LDL = 49 Trigs are 208  !!  Is trying to lose some weight     Past Medical History:  Diagnosis Date  . Arthritis    "all over" (06/30/2017)  . Chicken pox   . Dyslipidemia   . Dysrhythmia ablation 2018   a-fib  . GERD (gastroesophageal reflux disease)   . H/O: hypothyroidism    a. thyroid biopsy 1996 with synthroid. Stopped taking rx and was loss to follow up b. normal thyroid function 10/20/13  . High cholesterol   . Hx of cardiovascular stress test    ETT/Lexiscan Myoview (12/2013):  No ischemia; not gated; low risk.  Marland Kitchen Hypertension   . Obesity   . Persistent atrial fibrillation ([place])    a diagnosed 10/25/2013  . Psoriasis    on Skyrizi  . Seasonal allergies   . Stroke [facility]) 2016   "some speech problems since" (06/30/2017)  . 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 2/2 Afib with RVR for unknown duration (EF: 40-45%)  . Thyroid disease   . Type II diabetes mellitus ([place])    a. hg A1c 10, newly diagnosed (10/26/13)    Past Surgical History:  Procedure Laterality Date  . ATRIAL FIBRILLATION ABLATION  06/30/2017  . ATRIAL FIBRILLATION ABLATION N/A 06/30/2017   Procedure: Atrial Fibrillation Ablation;  Surgeon: Thompson Grayer, MD;  Location: [facility];  Service: Cardiovascular;  Laterality: N/A;  . BICEPT TENODESIS Right 05/24/2020   Procedure: BICEPS TENODESIS;  Surgeon: Meredith Pel, MD;  Location: [facility];  Service: Orthopedics;  Laterality: Right;  . BIOPSY THYROID  1996  . CARDIOVERSION N/A 12/07/2013   Procedure: CARDIOVERSION;  Surgeon: Casandra Doffing, MD;  Location: [facility];  Service: Cardiovascular;  Laterality: N/A;  . CARDIOVERSION N/A 03/10/2016   Procedure: CARDIOVERSION;  Surgeon: Thayer Headings, MD;  Location: [facility] ENDOSCOPY;  Service: Cardiovascular;  Laterality: N/A;  . ESOPHAGOGASTRODUODENOSCOPY N/A 12/07/2017   Procedure: ESOPHAGOGASTRODUODENOSCOPY (EGD);  Surgeon: Irene Shipper, MD;  Location: [facility] ENDOSCOPY;  Service: Endoscopy;  Laterality: N/A;  .  EYE MUSCLE SURGERY Right ~ 1966  . SHOULDER ARTHROSCOPY WITH ROTATOR CUFF REPAIR Right 05/24/2020   Procedure: right shoulder arthroscopy, rotator cuff tear repair biceps tenodesis;  Surgeon: Meredith Pel, MD;  Location: [facility];  Service: Orthopedics;  Laterality: Right;  . TEE WITHOUT CARDIOVERSION N/A 06/29/2017   Procedure: TRANSESOPHAGEAL ECHOCARDIOGRAM (TEE);  Surgeon: Fay Records, MD;  Location: [facility] ENDOSCOPY;  Service: Cardiovascular;  Laterality: N/A;  . TONSILLECTOMY  1971     Current Outpatient Medications  Medication Sig Dispense Refill  . atorvastatin (LIPITOR) 40 MG tablet Take 1 tablet (40 mg total) by mouth daily. 90 tablet 0  . Blood Glucose Monitoring Suppl ([place]) w/Device KIT Use as instructed to check blood sugar up to 3 times daily. 1 kit 0  . diclofenac sodium (VOLTAREN) 1 % GEL APPLY 2 GRAMS TOPICALLY 4 (FOUR) TIMES DAILY AS NEEDED (PAIN). 100 g 5  . DULoxetine (CYMBALTA) 30 MG capsule Take 1 capsule (30 mg total) by mouth daily. 90 capsule 1  . glipiZIDE (GLUCOTROL XL) 5 MG 24 hr tablet Take 1 tablet (5 mg total) by mouth daily with breakfast. 90 tablet 1  . insulin glargine (LANTUS SOLOSTAR) 100 UNIT/ML Solostar Pen Inject 80 Units into the skin at bedtime. 30 mL 2  . lisinopril (ZESTRIL) 10 MG tablet Take 1 tablet (10 mg total) by mouth daily.  90 tablet 2  . metFORMIN (GLUCOPHAGE) 1000 MG tablet Take 1 tablet (1,000 mg total) by mouth 2 (two) times daily with a meal. 180 tablet 0  . metoCLOPramide (REGLAN) 5 MG tablet Take 1 tablet (5 mg total) by mouth 3 (three) times daily before meals. 90 tablet 11  . omeprazole (PRILOSEC) 20 MG capsule Take 1 capsule (20 mg total) by mouth daily. 30 capsule 11  . ONETOUCH DELICA LANCETS 33A MISC Use as instructed to check blood sugar up to 3 times daily. 100 each 11  . ONETOUCH VERIO test strip USE STRIPS TO CHECK GLUCOSE UP TO THREE TIMES DAILY AS DIRECTED 100 each 6  . OZEMPIC, 1 MG/DOSE, 4 MG/3ML SOPN INJECT 1 MG INTO THE SKIN ONCE A WEEK 9 mL 1  . SKYRIZI, 150 MG DOSE, 75 MG/0.83ML PSKT Inject 150 mg as directed See admin instructions. 4x's per year    . TRUEPLUS PEN NEEDLES 32G X 4 MM MISC USE AS DIRECTED AT BEDTIME. 100 each 2  . Vitamin D, Ergocalciferol, (DRISDOL) 1.25 MG (50000 UNIT) CAPS capsule Take 1 capsule (50,000 Units total) by mouth every 7 (seven) days. 12 capsule 0  . metoprolol tartrate (LOPRESSOR) 25 MG tablet Take 1.5 tablets (37.5 mg total) by mouth 2 (two) times daily. 270 tablet 3  . rivaroxaban (XARELTO) 20 MG TABS tablet Take 1 tablet (20 mg total) by mouth daily with supper. 30 tablet 11   No current facility-administered medications for this visit.    Allergies:   Jardiance [empagliflozin] and Codeine    Social History:  The patient  reports that she quit smoking about 10 years ago. Her smoking use included cigarettes. She has a 22.00 pack-year smoking history. She has never used smokeless tobacco. She reports current alcohol use. She reports that she does not use drugs.   Family History:  The patient's family history includes Hyperlipidemia in her mother; Hypertension in her mother. She was adopted.     Review of Systems:  Physical Exam: Blood pressure 110/82, pulse 93, height 5' 5.5" (1.664 m), weight 235 lb 12.8  oz (107 kg), last menstrual period 04/15/2015,  SpO2 100 %.  GEN:   Middle age , morbidly obese female, Body mass index is 38.64 kg/m.  HEENT: Normal NECK: No JVD; No carotid bruits LYMPHATICS: No lymphadenopathy CARDIAC: RRR , no murmurs, rubs, gallops RESPIRATORY:  Clear to auscultation without rales, wheezing or rhonchi  ABDOMEN: Soft, non-tender, non-distended MUSCULOSKELETAL:  No edema; No deformity  SKIN: Warm and dry NEUROLOGIC:  Alert and oriented x 3   EKG: February 26, 2021: Normal sinus rhythm at 93.  Nonspecific ST and T wave changes.    Recent Labs: 08/16/2020: Hemoglobin 14.9; Platelets 276 12/25/2020: ALT 34; BUN 10; Creatinine, Ser 0.58; Potassium 4.2; Sodium 139; TSH 0.597    Lipid Panel    Component Value Date/Time   CHOL 115 12/25/2020 0802   TRIG 208 (H) 12/25/2020 0802   HDL 32 (L) 12/25/2020 0802   CHOLHDL 3.6 12/25/2020 0802   CHOLHDL 5.2 (H) 11/19/2016 1103   VLDL 59 (H) 11/19/2016 1103   LDLCALC 49 12/25/2020 0802   LDLDIRECT 114.0 02/13/2015 0750      Wt Readings from Last 3 Encounters:  02/26/21 235 lb 12.8 oz (107 kg)  02/05/21 237 lb 12.8 oz (107.9 kg)  12/26/20 248 lb 6.4 oz (112.7 kg)      Other studies Reviewed: Additional studies/ records that were reviewed today include: . Review of the above records demonstrates:    ASSESSMENT AND PLAN:  1. Atrial fibrillation-she status post A. fib ablation.   Maintaining NSR   Cont xarelto   2. Hypothyroidism -   Followed by primary care    3. Type 2 diabetes mellitus -   4.   Hyperlipidemia:   5.  Morbid obesity:  Will have one of ournurses discuss Optivia.      Current medicines are reviewed at length with the patient today.  The patient does not have concerns regarding medicines.  The following changes have been made:  no change  Labs/ tests ordered today include:   Orders Placed This Encounter  Procedures  . EKG 12-Lead    Disposition:     I will see her again in 1 year.    Mertie Moores, MD  02/26/2021 4:58 PM     [facility] [place], [place], [place]  [address] Phone: [phone]; Fax: [phone]

## 2021-02-26 ENCOUNTER — Ambulatory Visit (INDEPENDENT_AMBULATORY_CARE_PROVIDER_SITE_OTHER): Payer: 59 | Admitting: Cardiovascular Disease

## 2021-02-26 ENCOUNTER — Encounter: Payer: Self-pay | Admitting: Cardiovascular Disease

## 2021-02-26 ENCOUNTER — Other Ambulatory Visit: Payer: Self-pay

## 2021-02-26 VITALS — BP 110/82 | HR 93 | Ht 65.5 in | Wt 235.8 lb

## 2021-02-26 DIAGNOSIS — I4891 Unspecified atrial fibrillation: Secondary | ICD-10-CM

## 2021-02-26 MED ORDER — METOPROLOL TARTRATE 25 MG PO TABS
37.5000 mg | ORAL_TABLET | Freq: Two times a day (BID) | ORAL | 3 refills | Status: DC
Start: 2021-02-26 — End: 2022-06-25

## 2021-02-26 MED ORDER — RIVAROXABAN 20 MG PO TABS
20.0000 mg | ORAL_TABLET | Freq: Every day | ORAL | 11 refills | Status: DC
Start: 2021-02-26 — End: 2022-07-18

## 2021-02-26 NOTE — Patient Instructions (Signed)

## 2021-03-02 ENCOUNTER — Other Ambulatory Visit: Payer: Self-pay

## 2021-03-04 ENCOUNTER — Ambulatory Visit: Payer: 59 | Admitting: Physical Therapy

## 2021-03-14 ENCOUNTER — Other Ambulatory Visit: Payer: Self-pay

## 2021-03-14 ENCOUNTER — Encounter: Payer: Self-pay | Admitting: Physical Therapy

## 2021-03-14 ENCOUNTER — Ambulatory Visit: Payer: 59 | Attending: Internal Medicine | Admitting: Physical Therapy

## 2021-03-14 DIAGNOSIS — M546 Pain in thoracic spine: Secondary | ICD-10-CM | POA: Insufficient documentation

## 2021-03-14 DIAGNOSIS — M6281 Muscle weakness (generalized): Secondary | ICD-10-CM | POA: Insufficient documentation

## 2021-03-14 DIAGNOSIS — M545 Low back pain, unspecified: Secondary | ICD-10-CM | POA: Insufficient documentation

## 2021-03-14 DIAGNOSIS — G8929 Other chronic pain: Secondary | ICD-10-CM | POA: Diagnosis present

## 2021-03-14 DIAGNOSIS — M25511 Pain in right shoulder: Secondary | ICD-10-CM | POA: Insufficient documentation

## 2021-03-14 NOTE — Therapy (Addendum)
[facility] [address] [place], [place], [address] Phone: [phone]   Fax:  [phone]  Physical Therapy Evaluation  Patient Details  Name: Kelly Lang MRN: [id number] Date of Birth: [birth year] Referring Provider (PT): Marcine Matar, MD   Encounter Date: 03/14/2021   PT End of Session - 03/14/21 1634     Visit Number 1    Number of Visits 8    Date for PT Re-Evaluation 05/09/21    Authorization Type [employer] 2022    PT Start Time 1638    PT Stop Time 1730    PT Time Calculation (min) 52 min    Activity Tolerance Patient tolerated treatment well    Behavior During Therapy [facility] for tasks assessed/performed             Past Medical History:  Diagnosis Date   Arthritis    "all over" (06/30/2017)   Chicken pox    Dyslipidemia    Dysrhythmia ablation 2018   a-fib   GERD (gastroesophageal reflux disease)    H/O: hypothyroidism    a. thyroid biopsy 1996 with synthroid. Stopped taking rx and was loss to follow up b. normal thyroid function 10/20/13   High cholesterol    Hx of cardiovascular stress test    ETT/Lexiscan Myoview (12/2013):  No ischemia; not gated; low risk.   Hypertension    Obesity    Persistent atrial fibrillation (HCC)    a diagnosed 10/25/2013   Psoriasis    on Skyrizi   Seasonal allergies    Stroke [facility]) 2016   "some speech problems since" (06/30/2017)   Tachycardia induced cardiomyopathy (HCC)    a. 2/2 Afib with RVR for unknown duration (EF: 40-45%)   Thyroid disease    Type II diabetes mellitus (HCC)    a. hg A1c 10, newly diagnosed (10/26/13)    Past Surgical History:  Procedure Laterality Date   ATRIAL FIBRILLATION ABLATION  06/30/2017   ATRIAL FIBRILLATION ABLATION N/A 06/30/2017   Procedure: Atrial Fibrillation Ablation;  Surgeon: Hillis Range, MD;  Location: [facility];  Service: Cardiovascular;  Laterality: N/A;   BICEPT TENODESIS Right 05/24/2020   Procedure:  BICEPS TENODESIS;  Surgeon: Cammy Copa, MD;  Location: [facility];  Service: Orthopedics;  Laterality: Right;   BIOPSY THYROID  1996   CARDIOVERSION N/A 12/07/2013   Procedure: CARDIOVERSION;  Surgeon: Everette Rank, MD;  Location: [facility] ENDOSCOPY;  Service: Cardiovascular;  Laterality: N/A;   CARDIOVERSION N/A 03/10/2016   Procedure: CARDIOVERSION;  Surgeon: Vesta Mixer, MD;  Location: [facility] ENDOSCOPY;  Service: Cardiovascular;  Laterality: N/A;   ESOPHAGOGASTRODUODENOSCOPY N/A 12/07/2017   Procedure: ESOPHAGOGASTRODUODENOSCOPY (EGD);  Surgeon: Hilarie Fredrickson, MD;  Location: [facility] ENDOSCOPY;  Service: Endoscopy;  Laterality: N/A;   EYE MUSCLE SURGERY Right ~ 1966   SHOULDER ARTHROSCOPY WITH ROTATOR CUFF REPAIR Right 05/24/2020   Procedure: right shoulder arthroscopy, rotator cuff tear repair biceps tenodesis;  Surgeon: Cammy Copa, MD;  Location: [facility];  Service: Orthopedics;  Laterality: Right;   TEE WITHOUT CARDIOVERSION N/A 06/29/2017   Procedure: TRANSESOPHAGEAL ECHOCARDIOGRAM (TEE);  Surgeon: Pricilla Riffle, MD;  Location: [facility] ENDOSCOPY;  Service: Cardiovascular;  Laterality: N/A;   TONSILLECTOMY  1971    There were no vitals filed for this visit.    Subjective Assessment - 03/14/21 1640     Subjective My midback hurts constantly. It has been hurting since 2020 before covid, I fell onto my back  and hit my midback and that was when the pain started. Now when I fall it's always on my back, but I haven't had a fall since that incident. The midback as been hurting consistently for the past 6 months. The low back has been hurting for several years. I was here for RTC rehab awhile back. I notice I can't bring my shoulder up as high or reach behind my back as far anymore. Pt denies midback pain starting with RTC injury.    Pertinent History R RTC repair, HTN, obesity, stroke, DM II, persistent atrial fibrillation    How long can you sit comfortably? 1 hour     How long can you stand comfortably? 2 hours    How long can you walk comfortably? hotel across the street to walking for a long time.    Patient Stated Goals to be comfortable with it not hurting so bad, , not having to sit down after 2 hours.    Currently in Pain? Yes    Pain Score 3     Pain Location Thoracic    Pain Orientation Mid    Pain Descriptors / Indicators --   constantly there   Pain Radiating Towards L thigh    Pain Onset More than a month ago    Pain Frequency Constant    Aggravating Factors  sneezing, coughing, constantly there    Pain Relieving Factors lay down    Effect of Pain on Daily Activities daily activity    Multiple Pain Sites Yes    Pain Score 0    Pain Orientation Lower    Pain Descriptors / Indicators --   'makes me need to lean up against the wall'   Pain Type Chronic pain    Pain Onset More than a month ago    Pain Frequency Intermittent    Aggravating Factors  on feet    Pain Relieving Factors sit down (at least 15-20 minutes)    Effect of Pain on Daily Activities job duties, walking, standing                  [facility] PT Assessment - 03/14/21 0001       Assessment   Medical Diagnosis Chronic midline low back pain without sciatica    Referring Provider (PT) Marcine MatarJohnson, Deborah B, MD    Onset Date/Surgical Date --   6 months midback, several years LBP   Prior Therapy RTC rehab ended in 08/2020      Precautions   Precautions None      Restrictions   Weight Bearing Restrictions No      Balance Screen   Has the patient fallen in the past 6 months No    Has the patient had a decrease in activity level because of a fear of falling?  No    Is the patient reluctant to leave their home because of a fear of falling?  No      Home Nurse, mental healthnvironment   Living Environment Private residence    Living Arrangements Non-relatives/Friends    Available Help at Discharge Personal care attendant    Type of Home House    Home Access Stairs to enter    Entrance  Stairs-Number of Steps 3    Entrance Stairs-Rails Left    Home Layout One level    Home Equipment None      Prior Function   Level of Independence Independent    Vocation Full time employment    Vocation Requirements  sitting most of day, meeting with clients, working events for 8 hours on feet    Leisure shop, swimming, stationary bicycle 3 miles 3-5x/week      Cognition   Overall Cognitive Status Within Functional Limits for tasks assessed      Observation/Other Assessments   Observations forward headed, rounded shoulders, some kyphosis    Focus on Therapeutic Outcomes (FOTO)  54% functional status (predicted: 64%)      Sensation   Light Touch Appears Intact      Coordination   Gross Motor Movements are Fluid and Coordinated Yes      ROM / Strength   AROM / PROM / Strength AROM;Strength      AROM   AROM Assessment Site Lumbar;Hip    Lumbar Flexion just above toes, pulling in back    Lumbar Extension 50% limited, painful in mid back   dizzy   Lumbar - Right Side Bend to joint line    Lumbar - Left Side Bend to joint line, painful in mid back    Lumbar - Right Rotation painful in mid back, WNL    Lumbar - Left Rotation WNL      Strength   Overall Strength Comments R serratus anterior 4/5, L serratus anterior 5/5    Strength Assessment Site Hip;Knee;Ankle;Shoulder    Right/Left Shoulder Right;Left    Right Shoulder Flexion 4-/5   pain   Right Shoulder Extension 4-/5   pain   Right Shoulder ABduction 4/5   painful   Right Shoulder Internal Rotation 4+/5    Right Shoulder External Rotation 4/5   painful   Left Shoulder Flexion 4-/5    Left Shoulder Extension 5/5    Left Shoulder ABduction 5/5    Left Shoulder Internal Rotation 4+/5    Left Shoulder External Rotation 4+/5    Right/Left Hip Right;Left    Right Hip Flexion 5/5    Right Hip Extension 4/5    Right Hip ABduction 4/5    Right Hip ADduction --    Left Hip Flexion 4/5    Left Hip Extension 4-/5    Left Hip  ABduction 4-/5    Right/Left Knee Right;Left    Right Knee Flexion 5/5    Right Knee Extension 5/5    Left Knee Flexion 4+/5    Left Knee Extension 4+/5    Right/Left Ankle Right;Left    Right Ankle Dorsiflexion 5/5    Left Ankle Dorsiflexion 5/5      Flexibility   Soft Tissue Assessment /Muscle Length yes    Hamstrings bilateral hamstring to 60 degrees flexion    Quadriceps L knee quadricep length 110 degrees flexion; R knee quadricep length 115 degrees flexion      Palpation   Spinal mobility CPA's at T4, T8 that increased with continual grade III CPA's; L1-L5 CPA painful that increased as going down inferiorly; hypomobility throughout thoracic and lumbar    Palpation comment TTP lumbar paraspinals                                Objective measurements completed on examination: See above findings.          [facility] Adult PT Treatment/Exercise - 03/14/21 0001       Exercises   Exercises Knee/Hip;Shoulder      Knee/Hip Exercises: Stretches   Passive Hamstring Stretch 2 reps;Both;30 seconds      Knee/Hip Exercises: Standing   Hip  Abduction 10 reps;Both      Knee/Hip Exercises: Supine   Bridges 10 reps;Both    Bridges Limitations pain at full rnage, told to only go into nonpainful range      Shoulder Exercises: Seated   Other Seated Exercises double ER+ retractoin yellow band x10, red band x5   tactile cueing needed, red band too hard                         PT Education - 03/14/21 1733     Education Details HEP, sx explanation, POC    Person(s) Educated Patient    Methods Explanation;Demonstration;Tactile cues;Verbal cues;Handout    Comprehension Verbalized understanding;Returned demonstration;Need further instruction              PT Short Term Goals - 03/14/21 1752       PT SHORT TERM GOAL #1   Title Pt will be indepent with initial HEP    Baseline given at eval    Time 2    Period Weeks    Status New    Target Date 03/28/21       PT SHORT TERM GOAL #2   Title Pt will increase hamstring extensibility bilaterally to 70 degrees to decrease LBP and increase standing tolerance    Baseline 60 degrees flexion bilaterally    Time 3    Period Weeks    Status New    Target Date 04/04/21                PT Long Term Goals - 03/14/21 1753       PT LONG TERM GOAL #1   Title Pt will be independent with advanced HEP    Time 8    Period Weeks    Status New    Target Date 05/09/21      PT LONG TERM GOAL #2   Title Pt will improve FOTO 54% functional status to 64% to show functional improvement.    Baseline 54%    Time 8    Period Weeks    Status New    Target Date 05/09/21      PT LONG TERM GOAL #3   Title Pt will increase R periscapular MMT strength to >/= 4+/5 strength to help with overhead activity and decrease thoracic pain    Time 8    Period Weeks    Status New    Target Date 05/09/21      PT LONG TERM GOAL #4   Title Pt will increase L shoulder periscapular MMT strength 5/5 to help with overhead activity and decrease thoracic pain    Time 8    Period Weeks    Status New    Target Date 05/09/21      PT LONG TERM GOAL #5   Title Pt will have R hip strength 5/5 MMT in order to help with static standing and walking.    Time 8    Period Weeks    Status New    Target Date 05/09/21      Additional Long Term Goals   Additional Long Term Goals Yes      PT LONG TERM GOAL #6   Title Pt will increase L hip and L knee strength to 5/5 strength in order to help with static standing and walking.    Time 8    Period Weeks    Status New    Target Date 05/09/21  PT LONG TERM GOAL #7   Title Pt will increase bilateral hamstring length to 85 degrees to help decrease LBP and help with job duties.    Time 8    Period Weeks    Status New    Target Date 05/09/21                         Plan - 03/14/21 1741     Clinical Impression Statement Pt is a 60 y/o F presenting to OPPT for chronic  thoracic and lower back pain. Examination reveals R>L periscapular strength secondary to previous RTC repair and rehab that ended in 2021, decreased lumbar extension, decreased hamstring and quadricep extensibility, L>R hip strength deficits, and pain with CPA's in the lumbar and thoracic region that increased with continual CPA's. Pt also states that she notices that she isn't as strong in the R RTC since when she was once PT ended. Postural exercises given needed to work around RTC pain/strength deficits. Glut max/med exercises as well as hamstring flexibility exercises given this session. Pt would benefit from skilled PT in order to address above impairements in order to help with work duties, help with static postures, and increase community ambulation.    Personal Factors and Comorbidities Comorbidity 3+;Time since onset of injury/illness/exacerbation    Comorbidities R RTC repair, HTN, obesity, stroke, DM II, persistent atrial fibrillation    Examination-Activity Limitations Dressing;Squat;Stairs;Stand;Sleep;Sit;Reach Overhead;Locomotion Level;Bed Mobility;Bend;Caring for Others;Carry    Examination-Participation Restrictions Cleaning;Occupation;Meal Prep;Shop;Volunteer;[employer]    Stability/Clinical Decision Making Evolving/Moderate complexity    Clinical Decision Making Moderate    Rehab Potential Good    PT Frequency 1x / week    PT Duration 8 weeks    PT Treatment/Interventions ADLs/Self Care Home Management;Iontophoresis 4mg /ml Dexamethasone;Cryotherapy;Electrical Stimulation;Moist Heat;Therapeutic exercise;Therapeutic activities;Functional mobility training;Balance training;Patient/family education;Manual techniques;Passive range of motion;Dry needling;Joint Manipulations;Spinal Manipulations;Taping    PT Next Visit Plan look at rib etiology/contribution (pain with coughing/sneezing), R shoulder AROM measurements, postural periscapular strengthing, core strengthening, hip  strengthening    PT Home Exercise Plan [email or web address]    Consulted and Agree with Plan of Care Patient             Patient will benefit from skilled therapeutic intervention in order to improve the following deficits and impairments:  Difficulty walking,Pain,Postural dysfunction,Decreased strength,Decreased mobility,Decreased balance,Increased muscle spasms,Impaired UE functional use,Decreased range of motion,Hypomobility,Impaired flexibility,Improper body mechanics  Visit Diagnosis: Low back pain, unspecified back pain laterality, unspecified chronicity, unspecified whether sciatica present  Pain in thoracic spine  Muscle weakness (generalized)  Chronic right shoulder pain      Problem List Patient Active Problem List   Diagnosis Date Noted   Trigger ring finger of left hand 02/05/2021   Trigger little finger of right hand 02/05/2021   Mixed hyperlipidemia 09/17/2020   Multinodular goiter 09/16/2019   Chronic pain disorder 08/09/2018   Diabetic polyneuropathy associated with type 2 diabetes mellitus (HCC) 08/09/2018   Gastroparesis 12/21/2017   GI bleed 12/06/2017   Hepatic steatosis 12/04/2017   Chronic systolic CHF (congestive heart failure) (HCC) 09/16/2015   Cerebrovascular accident (CVA) due to 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)    Low back pain 04/03/2014   Vitamin D deficiency 01/20/2014   Essential hypertension, benign 01/20/2014   Chronic anticoagulation 10/31/2013   Tachycardia induced cardiomyopathy (HCC)    H/O: hypothyroidism    Class 3 severe obesity with serious comorbidity and body mass index (BMI) of 40.0 to 44.9 in adult [facility])  DM type 2 causing vascular disease (HCC)    Arthritis    Dyslipidemia    Atrial fibrillation  10/25/2013    Jeri Cos, SPT 03/14/2021, 6:01 PM  [facility] [address] [place], [place], [address] Phone: [phone]   Fax:  [phone]  Name: Kelly Lang MRN: [id number] Date of Birth: September 06, 1961

## 2021-03-14 NOTE — Patient Instructions (Signed)
Access Code: [email or web address] URL: [email or web address] Date: 03/14/2021 Prepared by: Jeri Cos  Exercises Supine Bridge - 1 x daily - 7 x weekly - 2-3 sets - 10 reps Supine Hamstring Stretch with Strap - 1 x daily - 7 x weekly - 3 sets - 30 seconds hold Standing Hip Abduction with Counter Support - 1 x daily - 7 x weekly - 2-3 sets - 10 reps Shoulder External Rotation and Scapular Retraction with Resistance - 1 x daily - 7 x weekly - 2-3 sets - 10 reps

## 2021-03-20 ENCOUNTER — Other Ambulatory Visit: Payer: Self-pay | Admitting: "Endocrinology

## 2021-03-20 DIAGNOSIS — E1142 Type 2 diabetes mellitus with diabetic polyneuropathy: Secondary | ICD-10-CM

## 2021-03-20 DIAGNOSIS — Z794 Long term (current) use of insulin: Secondary | ICD-10-CM

## 2021-04-02 ENCOUNTER — Other Ambulatory Visit: Payer: Self-pay | Admitting: Internal Medicine

## 2021-04-02 DIAGNOSIS — E1165 Type 2 diabetes mellitus with hyperglycemia: Secondary | ICD-10-CM

## 2021-04-02 DIAGNOSIS — E785 Hyperlipidemia, unspecified: Secondary | ICD-10-CM

## 2021-04-02 DIAGNOSIS — IMO0002 Reserved for concepts with insufficient information to code with codable children: Secondary | ICD-10-CM

## 2021-04-05 ENCOUNTER — Ambulatory Visit: Payer: 59

## 2021-04-08 ENCOUNTER — Other Ambulatory Visit: Payer: Self-pay

## 2021-04-12 ENCOUNTER — Ambulatory Visit: Payer: 59

## 2021-04-30 ENCOUNTER — Other Ambulatory Visit: Payer: Self-pay | Admitting: "Endocrinology

## 2021-04-30 ENCOUNTER — Ambulatory Visit: Payer: 59 | Admitting: "Endocrinology

## 2021-04-30 DIAGNOSIS — Z794 Long term (current) use of insulin: Secondary | ICD-10-CM

## 2021-04-30 DIAGNOSIS — E1142 Type 2 diabetes mellitus with diabetic polyneuropathy: Secondary | ICD-10-CM

## 2021-05-01 ENCOUNTER — Other Ambulatory Visit: Payer: Self-pay

## 2021-05-01 ENCOUNTER — Ambulatory Visit (INDEPENDENT_AMBULATORY_CARE_PROVIDER_SITE_OTHER): Payer: 59 | Admitting: "Endocrinology

## 2021-05-01 ENCOUNTER — Encounter: Payer: Self-pay | Admitting: "Endocrinology

## 2021-05-01 VITALS — BP 128/74 | HR 60 | Ht 65.5 in | Wt 240.8 lb

## 2021-05-01 DIAGNOSIS — E559 Vitamin D deficiency, unspecified: Secondary | ICD-10-CM

## 2021-05-01 DIAGNOSIS — E1159 Type 2 diabetes mellitus with other circulatory complications: Secondary | ICD-10-CM

## 2021-05-01 DIAGNOSIS — I10 Essential (primary) hypertension: Secondary | ICD-10-CM | POA: Diagnosis not present

## 2021-05-01 DIAGNOSIS — E782 Mixed hyperlipidemia: Secondary | ICD-10-CM | POA: Diagnosis not present

## 2021-05-01 LAB — POCT GLYCOSYLATED HEMOGLOBIN (HGB A1C): HbA1c, POC (controlled diabetic range): 8.4 % — AB (ref 0.0–7.0)

## 2021-05-01 MED ORDER — OZEMPIC (2 MG/DOSE) 8 MG/3ML ~~LOC~~ SOPN: 2.0000 mg | PEN_INJECTOR | SUBCUTANEOUS | 1 refills | Status: DC

## 2021-05-01 NOTE — Patient Instructions (Addendum)

## 2021-05-01 NOTE — Progress Notes (Signed)
05/01/2021, 4:45 PM  Endocrinology follow-up note   Subjective:    Patient ID: Kelly Lang, female    DOB: 1961-04-08.  Kelly Lang is being seen in follow-up after she was seen in consultation for management of currently uncontrolled symptomatic diabetes requested by  Ladell Pier, MD.   Past Medical History:  Diagnosis Date  . Arthritis    "all over" (06/30/2017)  . Chicken pox   . Dyslipidemia   . Dysrhythmia ablation 2018   a-fib  . GERD (gastroesophageal reflux disease)   . H/O: hypothyroidism    a. thyroid biopsy 1996 with synthroid. Stopped taking rx and was loss to follow up b. normal thyroid function 10/20/13  . High cholesterol   . Hx of cardiovascular stress test    ETT/Lexiscan Myoview (12/2013):  No ischemia; not gated; low risk.  Marland Kitchen Hypertension   . Obesity   . Persistent atrial fibrillation ([place])    a diagnosed 10/25/2013  . Psoriasis    on Skyrizi  . Seasonal allergies   . Stroke [facility]) 2016   "some speech problems since" (06/30/2017)  . 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 2/2 Afib with RVR for unknown duration (EF: 40-45%)  . Thyroid disease   . Type II diabetes mellitus ([place])    a. hg A1c 10, newly diagnosed (10/26/13)    Past Surgical History:  Procedure Laterality Date  . ATRIAL FIBRILLATION ABLATION  06/30/2017  . ATRIAL FIBRILLATION ABLATION N/A 06/30/2017   Procedure: Atrial Fibrillation Ablation;  Surgeon: Thompson Grayer, MD;  Location: [place] [facility];  Service: Cardiovascular;  Laterality: N/A;  . BICEPT TENODESIS Right 05/24/2020   Procedure: BICEPS TENODESIS;  Surgeon: Meredith Pel, MD;  Location: [place];  Service: Orthopedics;  Laterality: Right;  . BIOPSY THYROID  1996  . CARDIOVERSION N/A 12/07/2013   Procedure: CARDIOVERSION;  Surgeon: Casandra Doffing, MD;  Location: [place];  Service: Cardiovascular;   Laterality: N/A;  . CARDIOVERSION N/A 03/10/2016   Procedure: CARDIOVERSION;  Surgeon: Thayer Headings, MD;  Location: [facility];  Service: Cardiovascular;  Laterality: N/A;  . ESOPHAGOGASTRODUODENOSCOPY N/A 12/07/2017   Procedure: ESOPHAGOGASTRODUODENOSCOPY (EGD);  Surgeon: Irene Shipper, MD;  Location: [facility] ENDOSCOPY;  Service: Endoscopy;  Laterality: N/A;  . EYE MUSCLE SURGERY Right ~ 1966  . SHOULDER ARTHROSCOPY WITH ROTATOR CUFF REPAIR Right 05/24/2020   Procedure: right shoulder arthroscopy, rotator cuff tear repair biceps tenodesis;  Surgeon: Meredith Pel, MD;  Location: [place];  Service: Orthopedics;  Laterality: Right;  . TEE WITHOUT CARDIOVERSION N/A 06/29/2017   Procedure: TRANSESOPHAGEAL ECHOCARDIOGRAM (TEE);  Surgeon: Fay Records, MD;  Location: [place];  Service: Cardiovascular;  Laterality: N/A;  . TONSILLECTOMY  1971    Social History   Socioeconomic History  . Marital status: Divorced    Spouse name: Not on file  . Number of children: 0  . Years of education: 18  . Highest education level: Not on file  Occupational History  . Occupation: Chief Technology Officer  Tobacco Use  . Smoking status: Former Smoker    Packs/day: 1.00  Years: 22.00    Pack years: 22.00    Types: Cigarettes    Quit date: 12/01/2010    Years since quitting: 10.4  . Smokeless tobacco: Never Used  Vaping Use  . Vaping Use: Never used  Substance and Sexual Activity  . Alcohol use: Yes    Comment: social  . Drug use: No  . Sexual activity: Not Currently  Other Topics Concern  . Not on file  Social History Narrative   Moved to [place] from [place] in 2002.     Fun: shop, sew, decorate   Denies any beliefs effecting healthcare   Social Determinants of Health   Financial Resource Strain: Not on file  Food Insecurity: Not on file  Transportation Needs: Not on file  Physical Activity: Not on file  Stress: Not on file  Social Connections: Not on file     Family History  Adopted: Yes  Problem Relation Age of Onset  . Hypertension Mother   . Hyperlipidemia Mother     Outpatient Encounter Medications as of 05/01/2021  Medication Sig  . Semaglutide, 2 MG/DOSE, (OZEMPIC, 2 MG/DOSE,) 8 MG/3ML SOPN Inject 2 mg into the skin once a week.  Marland Kitchen atorvastatin (LIPITOR) 40 MG tablet Take 1 tablet by mouth once daily  . Blood Glucose Monitoring Suppl ([place]) w/Device KIT Use as instructed to check blood sugar up to 3 times daily.  . diclofenac sodium (VOLTAREN) 1 % GEL APPLY 2 GRAMS TOPICALLY 4 (FOUR) TIMES DAILY AS NEEDED (PAIN).  . DULoxetine (CYMBALTA) 30 MG capsule Take 1 capsule (30 mg total) by mouth daily.  Marland Kitchen glipiZIDE (GLUCOTROL XL) 5 MG 24 hr tablet Take 1 tablet (5 mg total) by mouth daily with breakfast.  . LANTUS SOLOSTAR 100 UNIT/ML Solostar Pen INJECT 80 UNITS SUBCUTANEOUSLY AT BEDTIME  . lisinopril (ZESTRIL) 10 MG tablet Take 1 tablet (10 mg total) by mouth daily.  . metFORMIN (GLUCOPHAGE) 1000 MG tablet TAKE 1 TABLET BY MOUTH TWICE DAILY WITH A MEAL  . metoCLOPramide (REGLAN) 5 MG tablet Take 1 tablet (5 mg total) by mouth 3 (three) times daily before meals.  . metoprolol tartrate (LOPRESSOR) 25 MG tablet Take 1.5 tablets (37.5 mg total) by mouth 2 (two) times daily.  Marland Kitchen omeprazole (PRILOSEC) 20 MG capsule Take 1 capsule (20 mg total) by mouth daily.  Glory Rosebush DELICA LANCETS 06Y MISC Use as instructed to check blood sugar up to 3 times daily.  Glory Rosebush VERIO test strip USE STRIPS TO CHECK GLUCOSE UP TO THREE TIMES DAILY AS DIRECTED  . rivaroxaban (XARELTO) 20 MG TABS tablet Take 1 tablet (20 mg total) by mouth daily with supper.  . SKYRIZI, 150 MG DOSE, 75 MG/0.83ML PSKT Inject 150 mg as directed See admin instructions. 4x's per year  . TRUEPLUS PEN NEEDLES 32G X 4 MM MISC USE AS DIRECTED AT BEDTIME.  . [DISCONTINUED] Fluticasone-Salmeterol (ADVAIR DISKUS) 250-50 MCG/DOSE AEPB Inhale 1 puff into the lungs 2 (two)  times daily.  . [DISCONTINUED] OZEMPIC, 1 MG/DOSE, 4 MG/3ML SOPN INJECT 1 MG INTO THE SKIN ONCE A WEEK  . [DISCONTINUED] Vitamin D, Ergocalciferol, (DRISDOL) 1.25 MG (50000 UNIT) CAPS capsule Take 1 capsule (50,000 Units total) by mouth every 7 (seven) days.   No facility-administered encounter medications on file as of 05/01/2021.    ALLERGIES: Allergies  Allergen Reactions  . Jardiance [Empagliflozin] Other (See Comments)    Yeast infections  . Codeine Itching and Rash    VACCINATION STATUS: Immunization  History  Administered Date(s) Administered  . Influenza,inj,Quad PF,6+ Mos 11/19/2016, 09/07/2017, 08/09/2018, 08/16/2020  . Moderna Sars-Covid-2 Vaccination 01/04/2020, 02/01/2020, 10/12/2020  . Pneumococcal Polysaccharide-23 06/16/2016  . Tdap 06/16/2016    Diabetes She presents for her follow-up diabetic visit. She has type 2 diabetes mellitus. Onset time: She was diagnosed at approximate age of 64 years. Her disease course has been worsening. There are no hypoglycemic associated symptoms. Pertinent negatives for hypoglycemia include no confusion, headaches, pallor or seizures. Associated symptoms include fatigue. Pertinent negatives for diabetes include no chest pain, no polydipsia, no polyphagia and no polyuria. There are no hypoglycemic complications. Symptoms are worsening. Diabetic complications include a CVA. Risk factors for coronary artery disease include dyslipidemia, diabetes mellitus, obesity, hypertension, post-menopausal, sedentary lifestyle and tobacco exposure. Current diabetic treatment includes insulin injections (She is currently on Levemir 155 units daily, Ozempic 1 mg weekly, glipizide 2.5 mg daily, Metformin 1000 mg p.o. twice daily.). Her weight is decreasing steadily. She is following a generally unhealthy diet. When asked about meal planning, she reported none. She has had a previous visit with a dietitian. She participates in exercise intermittently. Her home  blood glucose trend is fluctuating minimally. Her breakfast blood glucose range is generally 180-200 mg/dl. Her bedtime blood glucose range is generally 180-200 mg/dl. Her overall blood glucose range is 180-200 mg/dl. (She presents with infrequent monitoring of blood glucose, randomly monitor.  Her point-of-care A1c is 8.4% unchanged from her last visit A1c of 8.3%.  She did not report or document hypoglycemia.  She continues to tolerate Ozempic, glipizide, metformin, and Lantus.  ) An ACE inhibitor/angiotensin II receptor blocker is being taken. Eye exam is current.  Hypertension This is a chronic problem. The current episode started more than 1 year ago. The problem is controlled. Pertinent negatives include no chest pain, headaches, palpitations or shortness of breath. Risk factors for coronary artery disease include diabetes mellitus, dyslipidemia, obesity, sedentary lifestyle, smoking/tobacco exposure and post-menopausal state. Past treatments include ACE inhibitors and beta blockers. Hypertensive end-organ damage includes CVA.  Hyperlipidemia This is a chronic problem. The current episode started more than 1 year ago. The problem is uncontrolled. Exacerbating diseases include diabetes and obesity. Pertinent negatives include no chest pain, myalgias or shortness of breath. Risk factors for coronary artery disease include dyslipidemia, diabetes mellitus, hypertension, obesity, a sedentary lifestyle and post-menopausal.     Review of Systems  Constitutional: Positive for fatigue. Negative for chills, fever and unexpected weight change.  HENT: Negative for trouble swallowing and voice change.   Eyes: Negative for visual disturbance.  Respiratory: Negative for cough, shortness of breath and wheezing.   Cardiovascular: Negative for chest pain, palpitations and leg swelling.  Gastrointestinal: Negative for diarrhea, nausea and vomiting.  Endocrine: Negative for cold intolerance, heat intolerance,  polydipsia, polyphagia and polyuria.  Musculoskeletal: Negative for arthralgias and myalgias.  Skin: Negative for color change, pallor, rash and wound.  Neurological: Negative for seizures and headaches.  Psychiatric/Behavioral: Negative for confusion and suicidal ideas.    Objective:    Vitals with BMI 05/01/2021 02/26/2021 02/05/2021  Height 5' 5.5" 5' 5.5" -  Weight 240 lbs 13 oz 235 lbs 13 oz 237 lbs 13 oz  BMI 41.28 78.67 -  Systolic 672 094 709  Diastolic 74 82 84  Pulse 60 93 79    BP 128/74   Pulse 60   Ht 5' 5.5" (1.664 m)   Wt 240 lb 12.8 oz (109.2 kg)   LMP 04/15/2015 (LMP Unknown) Comment: last  in may  BMI 39.46 kg/m   Wt Readings from Last 3 Encounters:  05/01/21 240 lb 12.8 oz (109.2 kg)  02/26/21 235 lb 12.8 oz (107 kg)  02/05/21 237 lb 12.8 oz (107.9 kg)     Physical Exam Constitutional:      Appearance: She is well-developed.  HENT:     Head: Normocephalic and atraumatic.  Neck:     Thyroid: No thyromegaly.     Trachea: No tracheal deviation.  Cardiovascular:     Rate and Rhythm: Normal rate and regular rhythm.  Pulmonary:     Effort: Pulmonary effort is normal.  Abdominal:     Tenderness: There is no abdominal tenderness. There is no guarding.  Musculoskeletal:        General: Normal range of motion.     Cervical back: Normal range of motion and neck supple.  Skin:    General: Skin is warm and dry.     Coloration: Skin is not pale.     Findings: No erythema or rash.  Neurological:     Mental Status: She is alert and oriented to person, place, and time.     Cranial Nerves: No cranial nerve deficit.     Coordination: Coordination normal.     Deep Tendon Reflexes: Reflexes are normal and symmetric.     Comments: Diminished monofilament test sensation on bilateral lower extremities. Her dorsalis pedis and posterior tibial arterial pulses are normal.  Psychiatric:        Judgment: Judgment normal.       CMP ( most recent) CMP     Component  Value Date/Time   NA 139 12/25/2020 0802   K 4.2 12/25/2020 0802   CL 104 12/25/2020 0802   CO2 20 12/25/2020 0802   GLUCOSE 141 (H) 12/25/2020 0802   GLUCOSE 155 (H) 05/21/2020 0900   BUN 10 12/25/2020 0802   CREATININE 0.58 12/25/2020 0802   CREATININE 0.90 06/16/2016 1128   CALCIUM 8.8 12/25/2020 0802   PROT 6.8 12/25/2020 0802   ALBUMIN 3.9 12/25/2020 0802   AST 18 12/25/2020 0802   ALT 34 (H) 12/25/2020 0802   ALKPHOS 67 12/25/2020 0802   BILITOT 0.4 12/25/2020 0802   GFRNONAA 103 12/25/2020 0802   GFRNONAA 73 06/16/2016 1128   GFRAA 118 12/25/2020 0802   GFRAA 84 06/16/2016 1128     Diabetic Labs (most recent): Lab Results  Component Value Date   HGBA1C 8.4 (A) 05/01/2021   HGBA1C 8.3 (A) 09/24/2020   HGBA1C 8.5 (A) 06/20/2020     Lipid Panel ( most recent) Lipid Panel     Component Value Date/Time   CHOL 115 12/25/2020 0802   TRIG 208 (H) 12/25/2020 0802   HDL 32 (L) 12/25/2020 0802   CHOLHDL 3.6 12/25/2020 0802   CHOLHDL 5.2 (H) 11/19/2016 1103   VLDL 59 (H) 11/19/2016 1103   LDLCALC 49 12/25/2020 0802   LDLDIRECT 114.0 02/13/2015 0750   LABVLDL 34 12/25/2020 0802      Lab Results  Component Value Date   TSH 0.597 12/25/2020   TSH 1.01 09/16/2019   TSH 0.718 12/26/2015   TSH 0.950 09/15/2015   TSH 0.95 11/13/2014   TSH 1.020 10/25/2013   FREET4 0.99 12/25/2020   FREET4 0.98 12/26/2015   FREET4 0.98 10/25/2013      Assessment & Plan:   1. DM type 2 causing vascular disease ([address]) - Paisli Silfies has currently uncontrolled symptomatic type 2 DM since  60 years of age.  She presents with infrequent monitoring of blood glucose, randomly monitor.  Her point-of-care A1c is 8.4% unchanged from her last visit A1c of 8.3%.  She did not report or document hypoglycemia.  She continues to tolerate Ozempic, glipizide, metformin, and Lantus.   - I had a long discussion with her about the progressive nature of diabetes and the pathology behind its  complications. -her diabetes is complicated by CVA, peripheral neuropathy and she remains at a high risk for more acute and chronic complications which include CAD, CVA, CKD, retinopathy, and neuropathy. These are all discussed in detail with her.  - I have counseled her on diet  and weight management  by adopting a carbohydrate restricted/protein rich diet. Patient is encouraged to switch to  unprocessed or minimally processed     complex starch and increased protein intake (animal or plant source), fruits, and vegetables. -  she is advised to stick to a routine mealtimes to eat 3 meals  a day and avoid unnecessary snacks ( to snack only to correct hypoglycemia).   - she acknowledges that there is a room for improvement in her food and drink choices. - Suggestion is made for her to avoid simple carbohydrates  from her diet including Cakes, Sweet Desserts, Ice Cream, Soda (diet and regular), Sweet Tea, Candies, Chips, Cookies, Store Bought Juices, Alcohol in Excess of  1-2 drinks a day, Artificial Sweeteners,  Coffee Creamer, and "Sugar-free" Products, Lemonade. This will help patient to have more stable blood glucose profile and potentially avoid unintended weight gain.   - she reports that she has had enough encounters with dietitian in the past.    - I have approached her with the following individualized plan to manage  her diabetes and patient agrees:   - she will continue to need at least basal insulin.  I advised her to continue Lantus 80 units nightly, associated with monitoring of blood glucose twice a day-daily before breakfast and at bedtime.    -She will not need prandial insulin for now.  - she is warned not to take insulin without proper monitoring per orders.  - she is encouraged to call clinic for blood glucose levels less than 70 or above 200 mg /dl. - she is advised to continue Metformin 1000 mg p.o. twice daily- therapeutically suitable for patient . -She will continue to  benefit from [place].  I discussed and increase her Ozempic to 2 mg subcutaneously weekly.    -She will be continued on glipizide 5 mg XL p.o. daily at breakfast in lieu of prandial insulin.  - Specific targets for  A1c;  LDL, HDL,  and Triglycerides were discussed with the patient.  2) Blood Pressure /Hypertension: Her blood pressure is controlled to target.she is advised to continue her current medications including lisinopril 10 mg p.o. daily, metoprolol 25 mg p.o. twice daily. 3) Lipids/Hyperlipidemia:   Review of her recent lipid panel showed improved LDL at 49, overall improving from 114.  She is benefiting from statin.  Advised to continue atorvastatin 40 mg p.o. daily at bedtime.    Side effects and precautions discussed with her.  4)  Weight/Diet:  Body mass index is 39.46 kg/m.  -   clearly complicating her diabetes care.   she is  a candidate for weight loss. I discussed with her the fact that loss of 5 - 10% of her  current body weight will have the most impact on her diabetes management.  Exercise, and detailed carbohydrates information provided  -  detailed on discharge instructions. She will benefit from bariatric weight management, which  is briefly discussed with her.  She is given information brochure and contact information for [facility] bariatric team.    5) Chronic Care/Health Maintenance:  -she  is on ACEI/ARB and Statin medications and  is encouraged to initiate and continue to follow up with Ophthalmology, Dentist,  Podiatrist at least yearly or according to recommendations, and advised to   stay away from smoking. I have recommended yearly flu vaccine and pneumonia vaccine at least every 5 years; moderate intensity exercise for up to 150 minutes weekly; and  sleep for at least 7 hours a day.  Her screening ABI was negative for PAD in January 2022, the study will be repeated in January 2027, or sooner if needed.  - she is  advised to maintain close follow up with  Ladell Pier, MD for primary care needs, as well as her other providers for optimal and coordinated care.  I spent 44 minutes in the care of the patient today including review of labs from [place], Lipids, Thyroid Function, Hematology (current and previous including abstractions from other facilities); face-to-face time discussing  her blood glucose readings/logs, discussing hypoglycemia and hyperglycemia episodes and symptoms, medications doses, her options of short and long term treatment based on the latest standards of care / guidelines;  discussion about incorporating lifestyle medicine;  and documenting the encounter.    Please refer to Patient Instructions for Blood Glucose Monitoring and Insulin/Medications Dosing Guide"  in media tab for additional information. Please  also refer to " Patient Self Inventory" in the Media  tab for reviewed elements of pertinent patient history.  Kelly Lang participated in the discussions, expressed understanding, and voiced agreement with the above plans.  All questions were answered to her satisfaction. she is encouraged to contact clinic should she have any questions or concerns prior to her return visit.     Follow up plan: - Return in about 3 months (around 08/01/2021) for F/U with Pre-visit Labs, Meter, Logs, A1c here.Glade Lloyd, MD [facility] [address] [place], [place] [address] Phone: [phone]  Fax: [phone]    05/01/2021, 4:45 PM  This note was partially dictated with voice recognition software. Similar sounding words can be transcribed inadequately or may not  be corrected upon review.

## 2021-05-09 ENCOUNTER — Ambulatory Visit: Payer: 59 | Admitting: Internal Medicine

## 2021-05-15 ENCOUNTER — Telehealth: Payer: Self-pay

## 2021-05-15 DIAGNOSIS — E1159 Type 2 diabetes mellitus with other circulatory complications: Secondary | ICD-10-CM

## 2021-05-15 MED ORDER — GLIPIZIDE ER 5 MG PO TB24: 5.0000 mg | ORAL_TABLET | Freq: Every day | ORAL | 1 refills | Status: DC

## 2021-05-15 NOTE — Telephone Encounter (Signed)
Rx sent 

## 2021-05-15 NOTE — Addendum Note (Signed)
Addended by: Derrell Lolling on: 05/15/2021 10:12 AM   Modules accepted: Orders

## 2021-05-15 NOTE — Telephone Encounter (Signed)
Pt said it is [facility] [address] in [employer]

## 2021-05-15 NOTE — Telephone Encounter (Signed)
Pt left a VM requesting a refill on her Glipizide. She said she needs it go to a new pharmacy but did not leave that information. I tried to call pt back, left a VM for a return call.

## 2021-05-27 MED ORDER — GLIPIZIDE ER 5 MG PO TB24: 5.0000 mg | ORAL_TABLET | Freq: Every day | ORAL | 1 refills | Status: DC

## 2021-05-27 NOTE — Addendum Note (Signed)
Addended by: Derrell Lolling on: 05/27/2021 05:05 PM   Modules accepted: Orders

## 2021-05-27 NOTE — Telephone Encounter (Signed)
Please resend

## 2021-05-27 NOTE — Telephone Encounter (Signed)
Rx resent.

## 2021-06-04 ENCOUNTER — Other Ambulatory Visit: Payer: Self-pay

## 2021-06-04 DIAGNOSIS — E1159 Type 2 diabetes mellitus with other circulatory complications: Secondary | ICD-10-CM

## 2021-06-04 MED ORDER — OZEMPIC (2 MG/DOSE) 8 MG/3ML ~~LOC~~ SOPN
2.0000 mg | PEN_INJECTOR | SUBCUTANEOUS | 1 refills | Status: DC
Start: 2021-06-04 — End: 2021-09-27

## 2021-06-04 MED ORDER — GLIPIZIDE ER 5 MG PO TB24: 5.0000 mg | ORAL_TABLET | Freq: Every day | ORAL | 1 refills | Status: DC

## 2021-06-25 ENCOUNTER — Ambulatory Visit: Payer: 59 | Attending: Internal Medicine | Admitting: Internal Medicine

## 2021-06-25 ENCOUNTER — Other Ambulatory Visit: Payer: Self-pay

## 2021-06-25 ENCOUNTER — Encounter: Payer: Self-pay | Admitting: Internal Medicine

## 2021-06-25 VITALS — BP 112/77 | HR 73 | Resp 16 | Wt 234.0 lb

## 2021-06-25 DIAGNOSIS — I5022 Chronic systolic (congestive) heart failure: Secondary | ICD-10-CM | POA: Diagnosis not present

## 2021-06-25 DIAGNOSIS — L853 Xerosis cutis: Secondary | ICD-10-CM

## 2021-06-25 DIAGNOSIS — S90211A Contusion of right great toe with damage to nail, initial encounter: Secondary | ICD-10-CM

## 2021-06-25 DIAGNOSIS — Z23 Encounter for immunization: Secondary | ICD-10-CM

## 2021-06-25 DIAGNOSIS — E1169 Type 2 diabetes mellitus with other specified complication: Secondary | ICD-10-CM | POA: Diagnosis not present

## 2021-06-25 DIAGNOSIS — M2550 Pain in unspecified joint: Secondary | ICD-10-CM

## 2021-06-25 DIAGNOSIS — I10 Essential (primary) hypertension: Secondary | ICD-10-CM

## 2021-06-25 DIAGNOSIS — E669 Obesity, unspecified: Secondary | ICD-10-CM

## 2021-06-25 DIAGNOSIS — E119 Type 2 diabetes mellitus without complications: Secondary | ICD-10-CM

## 2021-06-25 MED ORDER — DICLOFENAC SODIUM 1 % EX GEL: 2.0000 g | Freq: Four times a day (QID) | CUTANEOUS | 2 refills | Status: DC

## 2021-06-25 NOTE — Progress Notes (Signed)
Patient ID: Kelly Lang, female    DOB: 01-11-1961  MRN: [id number]  CC: Hypertension   Subjective: Kelly Lang is a 60 y.o. 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  DM/Obese:  Lab Results  Component Value Date   HGBA1C 8.4 (A) 05/01/2021  saw endo Dr. Dorris Fetch last mth.  He increased Ozempic.  Patient reports she is doing well on her medications. Checking BS 1-2x/day with range 130-150 Doing good with eating habits.  She has lost 6 pounds since she last saw the endocrinologist Exercises on stationary bike 3x/wk  HTN/CHF/a.flutter: Reports compliance with medications including lisinopril, Xarelto, metoprolol.  Denies any chest pains or shortness of breath.  No swelling in the legs.  No PND orthopnea.  No palpitations.  Complains of trigger finger of the left and right hand on last visit.  She saw Dr. Marlou Sa.  Had an injection to the left ring finger with very good results.  In regards to her chronic lower back pain, she did have x-rays that showed moderate lower thoracic spondylosis and some osteoarthritis changes of the lower lumbar facets.  Patient was referred for physical therapy.  She did not go because her co-pay was too high.  Requests refill on Voltaren gel.  Patient complains of injury to the toenail on the right big toe that happened 3 weeks ago.  She accidentally stubbed her toe.  Most of the nail came off.  Using OTC fungus rub on it.  She wanted me to take a look at the nail.  Also complains of dryness of skin on the plantar surface of the feet especially the heel.   Patient Active Problem List   Diagnosis Date Noted   Trigger ring finger of left hand 02/05/2021   Trigger little finger of right hand 02/05/2021    Mixed hyperlipidemia 09/17/2020   Multinodular goiter 09/16/2019   Chronic pain disorder 08/09/2018   Diabetic polyneuropathy associated with type 2 diabetes mellitus ([place]) 08/09/2018   Gastroparesis 12/21/2017   GI bleed 12/06/2017   Hepatic steatosis 65/53/7482   Chronic systolic CHF (congestive heart failure) ([place]) 09/16/2015   Cerebrovascular accident (CVA) due to 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)    Low back pain 04/03/2014   Vitamin D deficiency 01/20/2014   Essential hypertension, benign 01/20/2014   Chronic anticoagulation 10/31/2013   Tachycardia induced cardiomyopathy ([place])    H/O: hypothyroidism    Class 3 severe obesity with serious comorbidity and body mass index (BMI) of 40.0 to 44.9 in adult [facility])    DM type 2 causing vascular disease ([place])    Arthritis    Dyslipidemia    Atrial fibrillation  10/25/2013     Current Outpatient Medications on File Prior to Visit  Medication Sig Dispense Refill   atorvastatin (LIPITOR) 40 MG tablet Take 1 tablet by mouth once daily 90 tablet 0   Blood Glucose Monitoring Suppl (ONETOUCH VERIO FLEX SYSTEM) w/Device KIT Use as instructed to check blood sugar up to 3 times daily. 1 kit 0   diclofenac sodium (VOLTAREN) 1 % GEL APPLY 2 GRAMS TOPICALLY 4 (FOUR) TIMES DAILY AS NEEDED (PAIN). 100 g 5   DULoxetine (CYMBALTA) 30 MG capsule Take 1 capsule (30 mg total) by mouth daily. 90 capsule 1  glipiZIDE (GLUCOTROL XL) 5 MG 24 hr tablet Take 1 tablet (5 mg total) by mouth daily with breakfast. 90 tablet 1   LANTUS SOLOSTAR 100 UNIT/ML Solostar Pen INJECT 80 UNITS SUBCUTANEOUSLY AT BEDTIME 30 mL 2   lisinopril (ZESTRIL) 10 MG tablet Take 1 tablet (10 mg total) by mouth daily. 90 tablet 2   metFORMIN (GLUCOPHAGE) 1000 MG tablet TAKE 1 TABLET BY MOUTH TWICE DAILY WITH A MEAL 180 tablet 0   metoCLOPramide (REGLAN) 5 MG tablet Take 1 tablet (5 mg total) by mouth 3 (three) times daily before meals. 90 tablet 11   metoprolol tartrate  (LOPRESSOR) 25 MG tablet Take 1.5 tablets (37.5 mg total) by mouth 2 (two) times daily. 270 tablet 3   omeprazole (PRILOSEC) 20 MG capsule Take 1 capsule (20 mg total) by mouth daily. 30 capsule 11   ONETOUCH DELICA LANCETS 16X MISC Use as instructed to check blood sugar up to 3 times daily. 100 each 11   ONETOUCH VERIO test strip USE STRIPS TO CHECK GLUCOSE UP TO THREE TIMES DAILY AS DIRECTED 100 each 6   rivaroxaban (XARELTO) 20 MG TABS tablet Take 1 tablet (20 mg total) by mouth daily with supper. 30 tablet 11   Semaglutide, 2 MG/DOSE, (OZEMPIC, 2 MG/DOSE,) 8 MG/3ML SOPN Inject 2 mg into the skin once a week. 9 mL 1   SKYRIZI, 150 MG DOSE, 75 MG/0.83ML PSKT Inject 150 mg as directed See admin instructions. 4x's per year     TRUEPLUS PEN NEEDLES 32G X 4 MM MISC USE AS DIRECTED AT BEDTIME. 100 each 2   [DISCONTINUED] Fluticasone-Salmeterol (ADVAIR DISKUS) 250-50 MCG/DOSE AEPB Inhale 1 puff into the lungs 2 (two) times daily. 60 each 0   No current facility-administered medications on file prior to visit.    Allergies  Allergen Reactions   Jardiance [Empagliflozin] Other (See Comments)    Yeast infections   Codeine Itching and Rash    Social History   Socioeconomic History   Marital status: Divorced    Spouse name: Not on file   Number of children: 0   Years of education: 14   Highest education level: Not on file  Occupational History   Occupation: Chief Technology Officer  Tobacco Use   Smoking status: Former    Packs/day: 1.00    Years: 22.00    Pack years: 22.00    Types: Cigarettes    Quit date: 12/01/2010    Years since quitting: 10.5   Smokeless tobacco: Never  Vaping Use   Vaping Use: Never used  Substance and Sexual Activity   Alcohol use: Yes    Comment: social   Drug use: No   Sexual activity: Not Currently  Other Topics Concern   Not on file  Social History Narrative   Moved to [place] from [place] in 2002.     Fun: shop, sew, decorate   Denies any beliefs  effecting healthcare   Social Determinants of Health   Financial Resource Strain: Not on file  Food Insecurity: Not on file  Transportation Needs: Not on file  Physical Activity: Not on file  Stress: Not on file  Social Connections: Not on file  Intimate Partner Violence: Not on file    Family History  Adopted: Yes  Problem Relation Age of Onset   Hypertension Mother    Hyperlipidemia Mother     Past Surgical History:  Procedure Laterality Date   ATRIAL FIBRILLATION ABLATION  06/30/2017   ATRIAL FIBRILLATION ABLATION N/A 06/30/2017  Procedure: Atrial Fibrillation Ablation;  Surgeon: Thompson Grayer, MD;  Location: [place] [facility];  Service: Cardiovascular;  Laterality: N/A;   BICEPT TENODESIS Right 05/24/2020   Procedure: BICEPS TENODESIS;  Surgeon: Meredith Pel, MD;  Location: [place];  Service: Orthopedics;  Laterality: Right;   BIOPSY THYROID  1996   CARDIOVERSION N/A 12/07/2013   Procedure: CARDIOVERSION;  Surgeon: Casandra Doffing, MD;  Location[facility] ENDOSCOPY;  Service: Cardiovascular;  Laterality: N/A;   CARDIOVERSION N/A 03/10/2016   Procedure: CARDIOVERSION;  Surgeon: Thayer Headings, MD;  Location: [facility] ENDOSCOPY;  Service: Cardiovascular;  Laterality: N/A;   ESOPHAGOGASTRODUODENOSCOPY N/A 12/07/2017   Procedure: ESOPHAGOGASTRODUODENOSCOPY (EGD);  Surgeon: Irene Shipper, MD;  Location: [facility] ENDOSCOPY;  Service: Endoscopy;  Laterality: N/A;   EYE MUSCLE SURGERY Right ~ 1966   SHOULDER ARTHROSCOPY WITH ROTATOR CUFF REPAIR Right 05/24/2020   Procedure: right shoulder arthroscopy, rotator cuff tear repair biceps tenodesis;  Surgeon: Meredith Pel, MD;  Location: [place];  Service: Orthopedics;  Laterality: Right;   TEE WITHOUT CARDIOVERSION N/A 06/29/2017   Procedure: TRANSESOPHAGEAL ECHOCARDIOGRAM (TEE);  Surgeon: Fay Records, MD;  Location: [facility] ENDOSCOPY;  Service: Cardiovascular;  Laterality: N/A;   TONSILLECTOMY  1971     ROS: Review of Systems Negative except as stated above  PHYSICAL EXAM: BP 112/77   Pulse 73   Resp 16   Wt 234 lb (106.1 kg)   LMP 04/15/2015 (LMP Unknown) Comment: last in may  SpO2 97%   BMI 38.35 kg/m   Wt Readings from Last 3 Encounters:  06/25/21 234 lb (106.1 kg)  05/01/21 240 lb 12.8 oz (109.2 kg)  02/26/21 235 lb 12.8 oz (107 kg)    Physical Exam  General appearance - alert, well appearing, and in no distress Mental status - normal mood, behavior, speech, dress, motor activity, and thought processes Neck - supple, no significant adenopathy Chest - clear to auscultation, no wheezes, rales or rhonchi, symmetric air entry Heart - normal rate, regular rhythm, normal S1, S2, no murmurs, rubs, clicks or gallops Extremities - peripheral pulses normal, no pedal edema, no clubbing or cyanosis Skin -some dryness of skin on the plantar surface especially the heel.  Right foot: Big toe without signs of acute infection.  The nail however has been cracked irregularly and is partially off.  Mild discoloration to the nail.  She has what appears to be abrasion on the undersurface of the left second toe that is healing.  No drainage or redness noted.  Patient states this was a blister.   Depression screen [facility] 2/9 06/25/2021 02/05/2021 08/16/2020  Decreased Interest 0 2 1  Down, Depressed, Hopeless '1 2 1  ' PHQ - 2 Score '1 4 2  ' Altered sleeping - 2 0  Tired, decreased energy - 2 1  Change in appetite - 1 0  Feeling bad or failure about yourself  - 0 0  Trouble concentrating - 1 0  Moving slowly or fidgety/restless - 1 0  Suicidal thoughts - 1 0  PHQ-9 Score - 12 3  Some recent data might be hidden    CMP Latest Ref Rng & Units 12/25/2020 08/16/2020 05/21/2020  Glucose 65 - 99 mg/dL 141(H) - 155(H)  BUN 6 - 24 mg/dL 10 - 14  Creatinine 0.57 - 1.00 mg/dL 0.58 - 0.74  Sodium 134 - 144 mmol/L 139 - 139  Potassium 3.5 - 5.2 mmol/L 4.2 - 3.9  Chloride 96 - 106 mmol/L 104 -  104  CO2 20  - 29 mmol/L 20 - 25  Calcium 8.7 - 10.2 mg/dL 8.8 - 9.2  Total Protein 6.0 - 8.5 g/dL 6.8 6.9 -  Total Bilirubin 0.0 - 1.2 mg/dL 0.4 0.3 -  Alkaline Phos 44 - 121 IU/L 67 82 -  AST 0 - 40 IU/L 18 12 -  ALT 0 - 32 IU/L 34(H) 21 -   Lipid Panel     Component Value Date/Time   CHOL 115 12/25/2020 0802   TRIG 208 (H) 12/25/2020 0802   HDL 32 (L) 12/25/2020 0802   CHOLHDL 3.6 12/25/2020 0802   CHOLHDL 5.2 (H) 11/19/2016 1103   VLDL 59 (H) 11/19/2016 1103   LDLCALC 49 12/25/2020 0802   LDLDIRECT 114.0 02/13/2015 0750    CBC    Component Value Date/Time   WBC 7.5 08/16/2020 0941   WBC 8.0 05/21/2020 0900   RBC 4.52 08/16/2020 0941   RBC 4.49 05/21/2020 0900   HGB 14.9 08/16/2020 0941   HCT 42.4 08/16/2020 0941   PLT 276 08/16/2020 0941   MCV 94 08/16/2020 0941   MCH 33.0 08/16/2020 0941   MCH 32.5 05/21/2020 0900   MCHC 35.1 08/16/2020 0941   MCHC 33.3 05/21/2020 0900   RDW 11.9 08/16/2020 0941   LYMPHSABS 1.7 11/25/2017 1017   MONOABS 704 03/07/2016 0904   EOSABS 0.3 11/25/2017 1017   BASOSABS 0.1 11/25/2017 1017    ASSESSMENT AND PLAN: 1. 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([place]) Commended her on weight loss.  Encouraged her to be consistent with healthy eating habits.  Continue trying to move as much as she can.  2. Essential hypertension At goal.  Continue current medications and low-salt diet.  3. Chronic systolic CHF (congestive heart failure) ([place]) Compensated.  Continue current medications and low-salt diet  4. Contusion of right great toe with damage to nail, initial encounter I will refer her to podiatry.  Most likely she will need to have the rest of this toenail removed. - Ambulatory referral to Podiatry  5. Dry skin Recommend trying over-the-counter Aveeno intensive care lotion or Curel  6. Polyarthralgia - diclofenac Sodium (VOLTAREN) 1 % GEL; Apply 2 g topically 4 (four) times daily.  Dispense: 100 g; Refill: 2  7. Need for vaccination against  Streptococcus pneumoniae Patient was to be given Pneumovax 20 today but we were out of it. She declined Shingrix instead.    Patient was given the opportunity to ask questions.  Patient verbalized understanding of the plan and was able to repeat key elements of the plan.   No orders of the defined types were placed in this encounter.    Requested Prescriptions    No prescriptions requested or ordered in this encounter    No follow-ups on file.  Karle Plumber, MD, FACP

## 2021-07-01 ENCOUNTER — Ambulatory Visit (INDEPENDENT_AMBULATORY_CARE_PROVIDER_SITE_OTHER): Payer: 59 | Admitting: Podiatry

## 2021-07-01 ENCOUNTER — Other Ambulatory Visit: Payer: Self-pay

## 2021-07-01 DIAGNOSIS — E11621 Type 2 diabetes mellitus with foot ulcer: Secondary | ICD-10-CM

## 2021-07-01 DIAGNOSIS — S91209A Unspecified open wound of unspecified toe(s) with damage to nail, initial encounter: Secondary | ICD-10-CM | POA: Diagnosis not present

## 2021-07-01 DIAGNOSIS — L97519 Non-pressure chronic ulcer of other part of right foot with unspecified severity: Secondary | ICD-10-CM

## 2021-07-01 MED ORDER — MUPIROCIN 2 % EX OINT: 1.0000 "application " | TOPICAL_OINTMENT | Freq: Every day | CUTANEOUS | 2 refills | Status: DC

## 2021-07-01 NOTE — Progress Notes (Signed)
  Subjective:  Patient ID: Kelly Lang, female    DOB: 03-Nov-1961,  MRN: [id number]  Chief Complaint  Patient presents with   Nail Problem     contusion right hallux toe/toenail has come off, patient is diabetic    60 y.o. female presents with the above complaint. History confirmed with patient.  The nail was getting thick and became loose from previous nail injury and peeled a portion of it off a couple weeks ago.  Not painful.  She also has callus under the big toe that previously was a wound  Objective:  Physical Exam: warm, good capillary refill, no trophic changes or ulcerative lesions, normal DP and PT pulses, and loss of protective sensation bilateral plantar foot. Left Foot: normal exam, no swelling, tenderness, instability; ligaments intact, full range of motion of all ankle/foot joints Right Foot: Hallux nail dystrophic and with onycholysis distally, proximal nail is well attached no signs of infection or ingrowing or paronychia, there is a 0.3 x 0.3 x 0.2 cm full-thickness wound with exposed subcutaneous tissue on the plantar IPJ of the hallux     Assessment:   1. Traumatic avulsion of nail plate of toe, initial encounter   2. 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  I debrided the remaining portions of the nail to a comfortable level, she will continue to monitor this and allow it to grow out, return as needed if it becomes ingrown or painful or infected   Ulcer right hallux -We discussed the etiology and factors that are a part of the wound healing process.  We also discussed the risk of infection both soft tissue and osteomyelitis from open ulceration.  Discussed the risk of limb loss if this happens or worsens. -Debridement as below. -Dressed with Iodosorb, DSD. -Mupirocin Rx sent to pharmacy  Procedure: Excisional Debridement of Wound Rationale: Removal of non-viable  soft tissue from the wound to promote healing.  Anesthesia: none Post-Debridement Wound Measurements: 0.3 cm x 0.3 cm x 0.2 cm  Type of Debridement: Sharp Excisional Tissue Removed: Non-viable soft tissue Depth of Debridement: subcutaneous tissue. Technique: Sharp excisional debridement to bleeding, viable wound base.  Dressing: Dry, sterile, compression dressing. Disposition: Patient tolerated procedure well.        Return in about 3 weeks (around 07/22/2021) for wound care.

## 2021-07-17 ENCOUNTER — Other Ambulatory Visit: Payer: Self-pay | Admitting: Internal Medicine

## 2021-07-17 DIAGNOSIS — I10 Essential (primary) hypertension: Secondary | ICD-10-CM

## 2021-07-17 NOTE — Telephone Encounter (Signed)
Requested medication (s) are due for refill today:  yes  Requested medication (s) are on the active medication list:  yes  Last refill:  03/25/2021  Future visit scheduled:  no  Notes to clinic:  Failed protocol:  Cr in normal range and within 180 days   K in normal range and within 180 days    Requested Prescriptions  Pending Prescriptions Disp Refills   lisinopril (ZESTRIL) 10 MG tablet [Pharmacy Med Name: Lisinopril 10 MG Oral Tablet] 90 tablet 0    Sig: Take 1 tablet by mouth once daily     Cardiovascular:  ACE Inhibitors Failed - 07/17/2021  7:46 AM      Failed - Cr in normal range and within 180 days    Creat  Date Value Ref Range Status  06/16/2016 0.90 0.50 - 1.05 mg/dL Final    Comment:      For patients > or = 58 years of age: The upper reference limit for Creatinine is approximately 13% higher for people identified as African-American.      Creatinine, Ser  Date Value Ref Range Status  12/25/2020 0.58 0.57 - 1.00 mg/dL Final   Creatinine, POC  Date Value Ref Range Status  09/24/2020 300 mg/dL Final          Failed - K in normal range and within 180 days    Potassium  Date Value Ref Range Status  12/25/2020 4.2 3.5 - 5.2 mmol/L Final          Passed - Patient is not pregnant      Passed - Last BP in normal range    BP Readings from Last 1 Encounters:  06/25/21 112/77          Passed - Valid encounter within last 6 months    Recent Outpatient Visits           3 weeks ago 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(HCC)   [place] [facility] Jonah Blue B, MD   5 months ago Chronic midline low back pain without sciatica   [facility] Marcine Matar, MD   8 months ago Essential hypertension   [facility] Marcine Matar, MD   11 months ago Need for influenza vaccination   [facility] [place], RPH-CPP   11  months ago Type 2 diabetes mellitus with diabetic polyneuropathy, with long-term current use of insulin [facility])   [place] [facility] Marcine Matar, MD

## 2021-07-23 ENCOUNTER — Other Ambulatory Visit: Payer: Self-pay

## 2021-07-23 ENCOUNTER — Ambulatory Visit (INDEPENDENT_AMBULATORY_CARE_PROVIDER_SITE_OTHER): Payer: 59 | Admitting: Podiatry

## 2021-07-23 DIAGNOSIS — E11621 Type 2 diabetes mellitus with foot ulcer: Secondary | ICD-10-CM

## 2021-07-23 DIAGNOSIS — L97519 Non-pressure chronic ulcer of other part of right foot with unspecified severity: Secondary | ICD-10-CM

## 2021-07-23 NOTE — Progress Notes (Signed)
  Subjective:  Patient ID: Kelly Lang, female    DOB: Nov 03, 1961,  MRN: [id number]  Chief Complaint  Patient presents with   Foot Ulcer    3 week wound care right    60 y.o. female returns with the above complaint. History confirmed with patient.  She is doing quite well she had quite a bit of improvement and has been using mupirocin  Objective:  Physical Exam: warm, good capillary refill, no trophic changes or ulcerative lesions, normal DP and PT pulses, and loss of protective sensation bilateral plantar foot. Left Foot: normal exam, no swelling, tenderness, instability; ligaments intact, full range of motion of all ankle/foot joints Right Foot: Hallux nail dystrophic and with onycholysis distally, proximal nail is well attached no signs of infection or ingrowing or paronychia, there is a 0.1 x 0.1 x 0.1 cm full-thickness wound with exposed subcutaneous tissue on the plantar IPJ of the hallux      Assessment:   1. 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  I debrided the remaining portions of the nail to a comfortable level, she will continue to monitor this and allow it to grow out, return as needed if it becomes ingrown or painful or infected   Ulcer right hallux -We discussed the etiology and factors that are a part of the wound healing process.  We also discussed the risk of infection both soft tissue and osteomyelitis from open ulceration.  Discussed the risk of limb loss if this happens or worsens. -Debridement as below. Continue mupirocin  Procedure: Excisional Debridement of Wound Rationale: Removal of non-viable soft tissue from the wound to promote healing.  Anesthesia: none Post-Debridement Wound Measurements: 0.1 x 0.1 x 0.1 cm Type of Debridement: Sharp Excisional Tissue Removed: Non-viable soft tissue Depth of Debridement: subcutaneous tissue. Technique: Sharp  excisional debridement to bleeding, viable wound base.  Dressing: Dry, sterile, compression dressing. Disposition: Patient tolerated procedure well.        Return in about 1 month (around 08/23/2021) for wound care.

## 2021-07-23 NOTE — Patient Instructions (Signed)
Use the mupirocin for 2 weeks and then start applying urea cream and leave open to air  Look for urea 40% cream or ointment and apply to the thickened dry skin / calluses. This can be bought over the counter, at a pharmacy or online such as [employer].

## 2021-07-31 ENCOUNTER — Other Ambulatory Visit: Payer: Self-pay | Admitting: Internal Medicine

## 2021-07-31 DIAGNOSIS — E785 Hyperlipidemia, unspecified: Secondary | ICD-10-CM

## 2021-08-02 ENCOUNTER — Other Ambulatory Visit: Payer: Self-pay | Admitting: Internal Medicine

## 2021-08-02 DIAGNOSIS — IMO0002 Reserved for concepts with insufficient information to code with codable children: Secondary | ICD-10-CM

## 2021-08-02 DIAGNOSIS — E1165 Type 2 diabetes mellitus with hyperglycemia: Secondary | ICD-10-CM

## 2021-08-02 DIAGNOSIS — M2550 Pain in unspecified joint: Secondary | ICD-10-CM

## 2021-08-02 LAB — COMPREHENSIVE METABOLIC PANEL
ALT: 25 IU/L (ref 0–32)
AST: 14 IU/L (ref 0–40)
Albumin/Globulin Ratio: 1.3 (ref 1.2–2.2)
Albumin: 4.2 g/dL (ref 3.8–4.9)
Alkaline Phosphatase: 71 IU/L (ref 44–121)
BUN/Creatinine Ratio: 18 (ref 9–23)
BUN: 13 mg/dL (ref 6–24)
Bilirubin Total: 0.3 mg/dL (ref 0.0–1.2)
CO2: 25 mmol/L (ref 20–29)
Calcium: 9.3 mg/dL (ref 8.7–10.2)
Chloride: 101 mmol/L (ref 96–106)
Creatinine, Ser: 0.72 mg/dL (ref 0.57–1.00)
Globulin, Total: 3.3 g/dL (ref 1.5–4.5)
Glucose: 162 mg/dL — ABNORMAL HIGH (ref 65–99)
Potassium: 4.1 mmol/L (ref 3.5–5.2)
Sodium: 139 mmol/L (ref 134–144)
Total Protein: 7.5 g/dL (ref 6.0–8.5)
eGFR: 97 mL/min/{1.73_m2} (ref >59)

## 2021-08-02 LAB — VITAMIN D 25 HYDROXY (VIT D DEFICIENCY, FRACTURES): Vit D, 25-Hydroxy: 22.4 ng/mL — ABNORMAL LOW (ref 30.0–100.0)

## 2021-08-02 NOTE — Telephone Encounter (Signed)
No future OV scheduled at this time.  Approved per protocol  Requested Prescriptions  Pending Prescriptions Disp Refills  . metFORMIN (GLUCOPHAGE) 1000 MG tablet [Pharmacy Med Name: metFORMIN HCl 1000 MG Oral Tablet] 180 tablet 0    Sig: TAKE 1 TABLET BY MOUTH TWICE DAILY WITH A MEAL     Endocrinology:  Diabetes - Biguanides Failed - 08/02/2021  8:05 AM      Failed - HBA1C is between 0 and 7.9 and within 180 days    HbA1c, POC (controlled diabetic range)  Date Value Ref Range Status  05/01/2021 8.4 (A) 0.0 - 7.0 % Final         Passed - Cr in normal range and within 360 days    Creat  Date Value Ref Range Status  06/16/2016 0.90 0.50 - 1.05 mg/dL Final    Comment:      For patients > or = 58 years of age: The upper reference limit for Creatinine is approximately 13% higher for people identified as African-American.      Creatinine, Ser  Date Value Ref Range Status  08/01/2021 0.72 0.57 - 1.00 mg/dL Final   Creatinine, POC  Date Value Ref Range Status  09/24/2020 300 mg/dL Final         Passed - eGFR in normal range and within 360 days    GFR, Est African American  Date Value Ref Range Status  06/16/2016 84 >=60 mL/min Final   GFR calc Af Amer  Date Value Ref Range Status  12/25/2020 118 >59 mL/min/1.73 Final    Comment:    **In accordance with recommendations from the NKF-ASN Task force,**   [facility] is in the process of updating its eGFR calculation to the   2021 CKD-EPI creatinine equation that estimates kidney function   without a race variable.    GFR, Est Non African American  Date Value Ref Range Status  06/16/2016 73 >=60 mL/min Final   GFR calc non Af Amer  Date Value Ref Range Status  12/25/2020 103 >59 mL/min/1.73 Final   GFR  Date Value Ref Range Status  02/13/2015 107.47 >60.00 mL/min Final   eGFR  Date Value Ref Range Status  08/01/2021 97 >59 mL/min/1.73 Final         Passed - Valid encounter within last 6 months    Recent Outpatient  Visits          1 month ago 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([place])   [place] Karle Plumber B, MD   5 months ago Chronic midline low back pain without sciatica   [place], MD   8 months ago Essential hypertension   [place], MD   11 months ago Need for influenza vaccination   [place], Jarome [place]atin, RPH-CPP   11 months ago Type 2 diabetes mellitus with diabetic polyneuropathy, with long-term current use of insulin [facility])   [place], Neoma Laming B, MD             . DULoxetine (CYMBALTA) 30 MG capsule [Pharmacy Med Name: DULoxetine HCl 30 MG Oral Capsule Delayed Release Particles] 90 capsule 0    Sig: Take 1 capsule by mouth once daily     Psychiatry: Antidepressants - SNRI Passed - 08/02/2021  8:05 AM      Passed - Last BP in  normal range    BP Readings from Last 1 Encounters:  06/25/21 112/77         Passed - Valid encounter within last 6 months    Recent Outpatient Visits          1 month ago 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[facility])   [place] Karle Plumber B, MD   5 months ago Chronic midline low back pain without sciatica   [place], MD   8 months ago Essential hypertension   [place], MD   11 months ago Need for influenza vaccination   [place], Stephen L, RPH-CPP   11 months ago Type 2 diabetes mellitus with diabetic polyneuropathy, with long-term current use of insulin [facility])   [place] [facility] Ladell Pier, MD

## 2021-08-07 ENCOUNTER — Ambulatory Visit: Payer: 59 | Admitting: "Endocrinology

## 2021-08-23 ENCOUNTER — Ambulatory Visit: Payer: Self-pay | Admitting: *Deleted

## 2021-08-23 ENCOUNTER — Other Ambulatory Visit: Payer: Self-pay

## 2021-08-23 ENCOUNTER — Ambulatory Visit: Payer: 59 | Admitting: "Endocrinology

## 2021-08-23 ENCOUNTER — Encounter: Payer: Self-pay | Admitting: Internal Medicine

## 2021-08-23 ENCOUNTER — Ambulatory Visit: Payer: 59 | Attending: Internal Medicine | Admitting: Internal Medicine

## 2021-08-23 VITALS — BP 134/82 | HR 67 | Resp 16 | Wt 234.6 lb

## 2021-08-23 DIAGNOSIS — E669 Obesity, unspecified: Secondary | ICD-10-CM

## 2021-08-23 DIAGNOSIS — R310 Gross hematuria: Secondary | ICD-10-CM

## 2021-08-23 DIAGNOSIS — E1169 Type 2 diabetes mellitus with other specified complication: Secondary | ICD-10-CM

## 2021-08-23 DIAGNOSIS — E119 Type 2 diabetes mellitus without complications: Secondary | ICD-10-CM

## 2021-08-23 DIAGNOSIS — Z23 Encounter for immunization: Secondary | ICD-10-CM

## 2021-08-23 LAB — POCT URINALYSIS DIP (CLINITEK)
Bilirubin, UA: NEGATIVE
Glucose, UA: NEGATIVE mg/dL
Nitrite, UA: NEGATIVE
POC PROTEIN,UA: 100 — AB
Spec Grav, UA: 1.025 (ref 1.010–1.025)
Urobilinogen, UA: 0.2 E.U./dL
pH, UA: 5 (ref 5.0–8.0)

## 2021-08-23 LAB — GLUCOSE, POCT (MANUAL RESULT ENTRY): POC Glucose: 104 mg/dl — AB (ref 70–99)

## 2021-08-23 NOTE — Telephone Encounter (Signed)
Reason for Disposition  Blood in urine  (Exception: could be normal menstrual bleeding)    Fell last Sat.  Blood in urine.  Answer Assessment - Initial Assessment Questions 1. COLOR of URINE: "Describe the color of the urine."  (e.g., tea-colored, pink, red, blood clots, bloody)     Pt fell and has blood in her urine.   Pt is calling in. 2. ONSET: "When did the bleeding start?"      I was at work and I slipped and fell and landed on my lower back in the kitchen.   My lower back is hurting.   I thought maybe I had an infection and it would go away.   I've been reading [employer] now I'm afraid.    I'm not having burning or frequency.   It pink/red every time she urinates.   I've not seen a dr since I fell. 3. EPISODES: "How many times has there been blood in the urine?" or "How many times today?"     Every time I pee.   I fell Sat. Evening.    4. PAIN with URINATION: "Is there any pain with passing your urine?" If Yes, ask: "How bad is the pain?"  (Scale 1-10; or mild, moderate, severe)    - MILD - complains slightly about urination hurting    - MODERATE - interferes with normal activities      - SEVERE - excruciating, unwilling or unable to urinate because of the pain      No burning or frequency.    The lower back pain is more towards the left side of my spine. 5. FEVER: "Do you have a fever?" If Yes, ask: "What is your temperature, how was it measured, and when did it start?"     No   I felt hot yesterday evening. 6. ASSOCIATED SYMPTOMS: "Are you passing urine more frequently than usual?"     No 7. OTHER SYMPTOMS: "Do you have any other symptoms?" (e.g., back/flank pain, abdominal pain, vomiting)     I'm having pain down my left leg.   No numbness/tingling.  Walking fine.  I'm using Voltaren cream which isn't really helping 8. PREGNANCY: "Is there any chance you are pregnant?" "When was your last menstrual period?"     Not asked due to age  Protocols used: Urine - Blood In-A-AH

## 2021-08-23 NOTE — Patient Instructions (Addendum)
I have sent culture urine to be checked for urinary tract infection.  I will get back to you with the results tomorrow. In the meantime I have submitted a referral for you to see the urologist.

## 2021-08-23 NOTE — Progress Notes (Signed)
Patient ID: Kelly Lang, female    DOB: 05-16-1961  MRN: [id number]  CC: Hematuria   Subjective: Kelly Lang is a 60 y.o. female who presents for UC visit Her concerns today include:  Pt with hx of a.flutter s/p ablation 05/2017 on Xarelto, DM type 2 , gastroparesis, obesity, HL, HTN, chronic systolic CHF with EF 03-47%, CVA, polyarthalgia on Cymbalta, psoriasis, COVID infection 01/2020, relative B12 def.  Pt c/o red urine x 1 wk No dysuria or frequency.  No previous episodes. No vaginal bleeding. No hx of kidney stones.  Pt is on Xarelto.  She reports that the day after she noticed the change in color of her urine, she slipped and fell on a greasy floor and landed on her back.  She wonders whether this had anything to do with it but this happened the day after the hematuria had started.  Patient Active Problem List   Diagnosis Date Noted   Trigger ring finger of left hand 02/05/2021   Trigger little finger of right hand 02/05/2021   Mixed hyperlipidemia 09/17/2020   Multinodular goiter 09/16/2019   Chronic pain disorder 08/09/2018   Diabetic polyneuropathy associated with type 2 diabetes mellitus ([place]) 08/09/2018   Gastroparesis 12/21/2017   GI bleed 12/06/2017   Hepatic steatosis 42/59/5638   Chronic systolic CHF (congestive heart failure) ([place]) 09/16/2015   Cerebrovascular accident (CVA) due to 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)    Low back pain 04/03/2014   Vitamin D deficiency 01/20/2014   Essential hypertension, benign 01/20/2014   Chronic anticoagulation 10/31/2013   Tachycardia induced cardiomyopathy ([place])    H/O: hypothyroidism    DM type 2 causing vascular disease ([place])    Arthritis    Dyslipidemia    Atrial fibrillation  10/25/2013     Current Outpatient Medications on File Prior to Visit  Medication Sig Dispense Refill   atorvastatin (LIPITOR) 40 MG tablet Take 1 tablet by mouth once daily 90 tablet 0   Blood Glucose Monitoring Suppl ([place]) w/Device KIT Use as instructed to check blood sugar up to 3 times daily. 1 kit 0   diclofenac sodium (VOLTAREN) 1 % GEL APPLY 2 GRAMS TOPICALLY 4 (FOUR) TIMES DAILY AS NEEDED (PAIN). 100 g 5   diclofenac Sodium (VOLTAREN) 1 % GEL Apply 2 g topically 4 (four) times daily. 100 g 2   DULoxetine (CYMBALTA) 30 MG capsule Take 1 capsule by mouth once daily 90 capsule 0   glipiZIDE (GLUCOTROL XL) 5 MG 24 hr tablet Take 1 tablet (5 mg total) by mouth daily with breakfast. 90 tablet 1   LANTUS SOLOSTAR 100 UNIT/ML Solostar Pen INJECT 80 UNITS SUBCUTANEOUSLY AT BEDTIME 30 mL 2   lisinopril (ZESTRIL) 10 MG tablet Take 1 tablet by mouth once daily 90 tablet 1   metFORMIN (GLUCOPHAGE) 1000 MG tablet TAKE 1 TABLET BY MOUTH TWICE DAILY WITH A MEAL 180 tablet 0   metoCLOPramide (REGLAN) 5 MG tablet Take 1 tablet (5 mg total) by mouth 3 (three) times daily before meals. 90 tablet 11   metoprolol tartrate (LOPRESSOR) 25 MG tablet Take 1.5 tablets (37.5 mg total) by mouth 2 (two) times daily. 270 tablet 3   mupirocin ointment (BACTROBAN) 2 % Apply 1 application topically daily. 30 g 2   omeprazole (PRILOSEC) 20 MG capsule Take 1 capsule (20 mg total) by mouth daily. 30 capsule 11   ONETOUCH DELICA LANCETS 75I MISC Use as instructed to check blood sugar up  to 3 times daily. 100 each 11   ONETOUCH VERIO test strip USE STRIPS TO CHECK GLUCOSE UP TO THREE TIMES DAILY AS DIRECTED 100 each 6   rivaroxaban (XARELTO) 20 MG TABS tablet Take 1 tablet (20 mg total) by mouth daily with supper. 30 tablet 11   Semaglutide, 2 MG/DOSE, (OZEMPIC, 2 MG/DOSE,) 8 MG/3ML SOPN Inject 2 mg into the skin once a week. 9 mL 1   SKYRIZI, 150 MG DOSE, 75 MG/0.83ML PSKT Inject 150 mg as directed See admin instructions. 4x's per year     TRUEPLUS PEN NEEDLES 32G X 4 MM MISC USE AS DIRECTED AT BEDTIME. 100 each 2   [DISCONTINUED] Fluticasone-Salmeterol (ADVAIR DISKUS) 250-50 MCG/DOSE AEPB Inhale 1 puff into the lungs 2 (two)  times daily. 60 each 0   No current facility-administered medications on file prior to visit.    Allergies  Allergen Reactions   Jardiance [Empagliflozin] Other (See Comments)    Yeast infections   Codeine Itching and Rash    Social History   Socioeconomic History   Marital status: Divorced    Spouse name: Not on file   Number of children: 0   Years of education: 14   Highest education level: Not on file  Occupational History   Occupation: Chief Technology Officer  Tobacco Use   Smoking status: Former    Packs/day: 1.00    Years: 22.00    Pack years: 22.00    Types: Cigarettes    Quit date: 12/01/2010    Years since quitting: 10.7   Smokeless tobacco: Never  Vaping Use   Vaping Use: Never used  Substance and Sexual Activity   Alcohol use: Yes    Comment: social   Drug use: No   Sexual activity: Not Currently  Other Topics Concern   Not on file  Social History Narrative   Moved to [place] from [place] in 2002.     Fun: shop, sew, decorate   Denies any beliefs effecting healthcare   Social Determinants of Health   Financial Resource Strain: Not on file  Food Insecurity: Not on file  Transportation Needs: Not on file  Physical Activity: Not on file  Stress: Not on file  Social Connections: Not on file  Intimate Partner Violence: Not on file    Family History  Adopted: Yes  Problem Relation Age of Onset   Hypertension Mother    Hyperlipidemia Mother     Past Surgical History:  Procedure Laterality Date   ATRIAL FIBRILLATION ABLATION  06/30/2017   ATRIAL FIBRILLATION ABLATION N/A 06/30/2017   Procedure: Atrial Fibrillation Ablation;  Surgeon: Thompson Grayer, MD;  Location: [place] [facility];  Service: Cardiovascular;  Laterality: N/A;   BICEPT TENODESIS Right 05/24/2020   Procedure: BICEPS TENODESIS;  Surgeon: Meredith Pel, MD;  Location: [place];  Service: Orthopedics;  Laterality: Right;   BIOPSY THYROID  1996   CARDIOVERSION N/A  12/07/2013   Procedure: CARDIOVERSION;  Surgeon: Casandra Doffing, MD;  Location: [facility] ENDOSCOPY;  Service: Cardiovascular;  Laterality: N/A;   CARDIOVERSION N/A 03/10/2016   Procedure: CARDIOVERSION;  Surgeon: Thayer Headings, MD;  Location[facility] ENDOSCOPY;  Service: Cardiovascular;  Laterality: N/A;   ESOPHAGOGASTRODUODENOSCOPY N/A 12/07/2017   Procedure: ESOPHAGOGASTRODUODENOSCOPY (EGD);  Surgeon: Irene Shipper, MD;  Location: [facility] ENDOSCOPY;  Service: Endoscopy;  Laterality: N/A;   EYE MUSCLE SURGERY Right ~ 1966   SHOULDER ARTHROSCOPY WITH ROTATOR CUFF REPAIR Right 05/24/2020   Procedure: right shoulder arthroscopy, rotator cuff  tear repair biceps tenodesis;  Surgeon: Meredith Pel, MD;  Location: [place];  Service: Orthopedics;  Laterality: Right;   TEE WITHOUT CARDIOVERSION N/A 06/29/2017   Procedure: TRANSESOPHAGEAL ECHOCARDIOGRAM (TEE);  Surgeon: Fay Records, MD;  Location: [facility] ENDOSCOPY;  Service: Cardiovascular;  Laterality: N/A;   TONSILLECTOMY  1971    ROS: Review of Systems Negative except as stated above  PHYSICAL EXAM: BP 134/82   Pulse 67   Resp 16   Wt 234 lb 9.6 oz (106.4 kg)   LMP 04/15/2015 (LMP Unknown) Comment: last in may  SpO2 99%   BMI 38.45 kg/m   Physical Exam  General appearance - alert, well appearing, and in no distress Mental status -patient is a bit tearful as she is worried about her current symptoms. Abdomen: Normal bowel sounds.  Slight left lower quadrant tenderness without guarding or rebound.  No suprapubic tenderness.  No flank tenderness.  CMP Latest Ref Rng & Units 08/01/2021 12/25/2020 08/16/2020  Glucose 65 - 99 mg/dL 162(H) 141(H) -  BUN 6 - 24 mg/dL 13 10 -  Creatinine 0.57 - 1.00 mg/dL 0.72 0.58 -  Sodium 134 - 144 mmol/L 139 139 -  Potassium 3.5 - 5.2 mmol/L 4.1 4.2 -  Chloride 96 - 106 mmol/L 101 104 -  CO2 20 - 29 mmol/L 25 20 -  Calcium 8.7 - 10.2 mg/dL 9.3 8.8 -  Total Protein 6.0 - 8.5 g/dL 7.5 6.8 6.9  Total  Bilirubin 0.0 - 1.2 mg/dL 0.3 0.4 0.3  Alkaline Phos 44 - 121 IU/L 71 67 82  AST 0 - 40 IU/L '14 18 12  ' ALT 0 - 32 IU/L 25 34(H) 21   Lipid Panel     Component Value Date/Time   CHOL 115 12/25/2020 0802   TRIG 208 (H) 12/25/2020 0802   HDL 32 (L) 12/25/2020 0802   CHOLHDL 3.6 12/25/2020 0802   CHOLHDL 5.2 (H) 11/19/2016 1103   VLDL 59 (H) 11/19/2016 1103   LDLCALC 49 12/25/2020 0802   LDLDIRECT 114.0 02/13/2015 0750    CBC    Component Value Date/Time   WBC 7.5 08/16/2020 0941   WBC 8.0 05/21/2020 0900   RBC 4.52 08/16/2020 0941   RBC 4.49 05/21/2020 0900   HGB 14.9 08/16/2020 0941   HCT 42.4 08/16/2020 0941   PLT 276 08/16/2020 0941   MCV 94 08/16/2020 0941   MCH 33.0 08/16/2020 0941   MCH 32.5 05/21/2020 0900   MCHC 35.1 08/16/2020 0941   MCHC 33.3 05/21/2020 0900   RDW 11.9 08/16/2020 0941   LYMPHSABS 1.7 11/25/2017 1017   MONOABS 704 03/07/2016 0904   EOSABS 0.3 11/25/2017 1017   BASOSABS 0.1 11/25/2017 1017   Results for orders placed or performed in visit on 08/23/21  POCT glucose (manual entry)  Result Value Ref Range   POC Glucose 104 (A) 70 - 99 mg/dl  POCT URINALYSIS DIP (CLINITEK)  Result Value Ref Range   Color, UA brown (A) yellow   Clarity, UA cloudy (A) clear   Glucose, UA negative negative mg/dL   Bilirubin, UA negative negative   Ketones, POC UA trace (5) (A) negative mg/dL   Spec Grav, UA 1.025 1.010 - 1.025   Blood, UA large (A) negative   pH, UA 5.0 5.0 - 8.0   POC PROTEIN,UA =100 (A) negative, trace   Urobilinogen, UA 0.2 0.2 or 1.0 E.U./dL   Nitrite, UA Negative Negative   Leukocytes, UA Trace (A) Negative  ASSESSMENT AND PLAN: 1. Gross hematuria Differential diagnoses include cystitis, bladder lesion, kidney stone (though this seems less likely as patient has painless hematuria).  Point-of-care UA looks negative for UTI.  However I have sent it for urinalysis with reflex microscopy.  If this confirms that it is not a urinary tract  infection, I told patient that we will have to get her to the urologist for cystoscopy to evaluate further. - POCT URINALYSIS DIP (CLINITEK) - Urinalysis, Routine w reflex microscopic - Microalbumin / creatinine urine ratio - Ambulatory referral to Urology - CBC  2. 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([place]) This was not addressed today. - POCT glucose (manual entry)  3. Need for immunization against influenza - Flu Vaccine QUAD 55moIM (Fluarix, Fluzone & Alfiuria Quad PF)     Patient was given the opportunity to ask questions.  Patient verbalized understanding of the plan and was able to repeat key elements of the plan.   Orders Placed This Encounter  Procedures   Flu Vaccine QUAD 651moM (Fluarix, Fluzone & Alfiuria Quad PF)   Urinalysis, Routine w reflex microscopic   Microalbumin / creatinine urine ratio   CBC   Ambulatory referral to Urology   POCT glucose (manual entry)   POCT URINALYSIS DIP (CLINITEK)     Requested Prescriptions    No prescriptions requested or ordered in this encounter    No follow-ups on file.  DeKarle PlumberMD, FACP

## 2021-08-23 NOTE — Progress Notes (Signed)
Patient states she has fallen last Saturday

## 2021-08-23 NOTE — Telephone Encounter (Signed)
Pt called in c/o having pink/red blood in her urine since she fell last Saturday hitting her lower back.   She is also c/o pain down her left leg.   The pain is to the lower left of her lower back.   Denies any urinary symptoms except the pink/red discoloration in her urine every time she goes.  I made her an appt with Dr. Jonah Blue for today at 1:30 at [facility].

## 2021-08-24 ENCOUNTER — Telehealth: Payer: Self-pay | Admitting: Internal Medicine

## 2021-08-24 DIAGNOSIS — R310 Gross hematuria: Secondary | ICD-10-CM

## 2021-08-24 LAB — CBC
Hematocrit: 40 % (ref 34.0–46.6)
Hemoglobin: 13.6 g/dL (ref 11.1–15.9)
MCH: 32.2 pg (ref 26.6–33.0)
MCHC: 34 g/dL (ref 31.5–35.7)
MCV: 95 fL (ref 79–97)
Platelets: 285 10*3/uL (ref 150–450)
RBC: 4.22 x10E6/uL (ref 3.77–5.28)
RDW: 12.3 % (ref 11.7–15.4)
WBC: 9.4 10*3/uL (ref 3.4–10.8)

## 2021-08-24 LAB — MICROSCOPIC EXAMINATION
Casts: NONE SEEN /lpf
RBC, Urine: >30 /hpf — AB (ref 0–2)
WBC, UA: >30 /hpf — AB (ref 0–5)

## 2021-08-24 LAB — URINALYSIS, ROUTINE W REFLEX MICROSCOPIC
Bilirubin, UA: NEGATIVE
Glucose, UA: NEGATIVE
Ketones, UA: NEGATIVE
Nitrite, UA: NEGATIVE
Specific Gravity, UA: 1.019 (ref 1.005–1.030)
Urobilinogen, Ur: 0.2 mg/dL (ref 0.2–1.0)
pH, UA: 5.5 (ref 5.0–7.5)

## 2021-08-24 LAB — MICROALBUMIN / CREATININE URINE RATIO
Creatinine, Urine: 59.8 mg/dL
Microalb/Creat Ratio: 175 mg/g creat — ABNORMAL HIGH (ref 0–29)
Microalbumin, Urine: 104.9 ug/mL

## 2021-08-24 MED ORDER — CIPROFLOXACIN HCL 500 MG PO TABS: 500.0000 mg | ORAL_TABLET | Freq: Two times a day (BID) | ORAL | 0 refills | Status: AC

## 2021-08-24 NOTE — Telephone Encounter (Signed)
PC placed to pt this a.m.  Pt informed that UA contains a lot of blood and WBC.  Usually with UTI we see increase WBC and nitrates.  Her urine shows no Nitrates but WBC and bacteria.  I think it is worth giving course of abx x 1 wk then repeating UA upon completion while we await appt with urologist.  Rxn for Cipro sent to her pharmacy. Pt expressed understanding.

## 2021-08-27 ENCOUNTER — Other Ambulatory Visit: Payer: Self-pay

## 2021-08-27 ENCOUNTER — Ambulatory Visit (INDEPENDENT_AMBULATORY_CARE_PROVIDER_SITE_OTHER): Payer: 59 | Admitting: Podiatry

## 2021-08-27 DIAGNOSIS — L97519 Non-pressure chronic ulcer of other part of right foot with unspecified severity: Secondary | ICD-10-CM | POA: Diagnosis not present

## 2021-08-27 DIAGNOSIS — E11621 Type 2 diabetes mellitus with foot ulcer: Secondary | ICD-10-CM

## 2021-08-27 NOTE — Patient Instructions (Signed)
Look for urea 40% cream or ointment and apply to the thickened dry skin / calluses. This can be bought over the counter, at a pharmacy or online such as [employer].  

## 2021-08-27 NOTE — Progress Notes (Signed)
  Subjective:  Patient ID: Kelly Lang, female    DOB: November 28, 1961,  MRN: [id number]  Chief Complaint  Patient presents with   Foot Ulcer    1 month follow upright great toe    60 y.o. female returns with the above complaint. History confirmed with patient.  Doing well thinks it might be healed  Objective:  Physical Exam: warm, good capillary refill, no trophic changes or ulcerative lesions, normal DP and PT pulses, and loss of protective sensation bilateral plantar foot. Left Foot: normal exam, no swelling, tenderness, instability; ligaments intact, full range of motion of all ankle/foot joints Right Foot: Hallux nail dystrophic and with onycholysis distally, proximal nail is well attached no signs of infection or ingrowing or paronychia, healed ulceration with plantar callus      Assessment:   No diagnosis found.    Plan:  Patient was evaluated and treated and all questions answered.    Ulcer right hallux -doing well her ulceration has completely healed she can use urea cream and a pumice stone to keep the area free from callus and blistering.  Discussed recurrence of the lesion and she will return to see me as needed for this       Return if symptoms worsen or fail to improve.

## 2021-08-30 ENCOUNTER — Other Ambulatory Visit: Payer: Self-pay | Admitting: "Endocrinology

## 2021-08-30 DIAGNOSIS — Z794 Long term (current) use of insulin: Secondary | ICD-10-CM

## 2021-08-30 DIAGNOSIS — E1142 Type 2 diabetes mellitus with diabetic polyneuropathy: Secondary | ICD-10-CM

## 2021-09-06 ENCOUNTER — Ambulatory Visit (INDEPENDENT_AMBULATORY_CARE_PROVIDER_SITE_OTHER): Payer: 59 | Admitting: Urology

## 2021-09-06 ENCOUNTER — Encounter: Payer: Self-pay | Admitting: Urology

## 2021-09-06 ENCOUNTER — Other Ambulatory Visit: Payer: Self-pay

## 2021-09-06 VITALS — BP 130/82 | HR 89 | Ht 65.5 in | Wt 234.0 lb

## 2021-09-06 DIAGNOSIS — R310 Gross hematuria: Secondary | ICD-10-CM

## 2021-09-06 LAB — MICROSCOPIC EXAMINATION: Bacteria, UA: NONE SEEN

## 2021-09-06 LAB — URINALYSIS, COMPLETE
Bilirubin, UA: NEGATIVE
Ketones, UA: NEGATIVE
Leukocytes,UA: NEGATIVE
Nitrite, UA: NEGATIVE
Protein,UA: NEGATIVE
RBC, UA: NEGATIVE
Specific Gravity, UA: 1.02 (ref 1.005–1.030)
Urobilinogen, Ur: 0.2 mg/dL (ref 0.2–1.0)
pH, UA: 5.5 (ref 5.0–7.5)

## 2021-09-06 NOTE — Progress Notes (Signed)
09/06/2021 11:31 AM   Kelly Lang [birth year] [id number]  Referring provider: Ladell Pier, MD [address] [place],  [place] [address]  Chief Complaint  Patient presents with   New Patient (Initial Visit)   Hematuria    HPI: Kelly Lang is a 60 y.o. female referred for evaluation of gross hematuria.  Saw Dr. Wynetta Emery 08/23/2021 with a 1 week history of gross hematuria. Urine described as tea colored History of CVA and atrial fibrillation on Xarelto No prior history of urologic problems or previous episodes gross hematuria UA 08/23/2021 Brown with large blood on dipstick.  Microscopy >30 WBC/RBC; no culture ordered No dysuria, did have low back pain Treated empirically with antibiotics CT abdomen/pelvis 2019 showed no GU abnormalities 22 pack-year smoking history; quit ~ 10 years ago Urine has been clear since completing antibiotics  PMH: Past Medical History:  Diagnosis Date   Arthritis    "all over" (06/30/2017)   Chicken pox    Dyslipidemia    Dysrhythmia ablation 2018   a-fib   GERD (gastroesophageal reflux disease)    H/O: hypothyroidism    a. thyroid biopsy 1996 with synthroid. Stopped taking rx and was loss to follow up b. normal thyroid function 10/20/13   High cholesterol    Hx of cardiovascular stress test    ETT/Lexiscan Myoview (12/2013):  No ischemia; not gated; low risk.   Hypertension    Obesity    Persistent atrial fibrillation ([place])    a diagnosed 10/25/2013   Psoriasis    on Skyrizi   Seasonal allergies    Stroke [facility]) 2016   "some speech problems since" (06/30/2017)   Tachycardia induced cardiomyopathy (HCC)    a. 2/2 Afib with RVR for unknown duration (EF: 40-45%)   Thyroid disease    Type II diabetes mellitus ([place])    a. hg A1c 10, newly diagnosed (10/26/13)    Surgical History: Past Surgical History:  Procedure Laterality Date   ATRIAL FIBRILLATION ABLATION  06/30/2017   ATRIAL FIBRILLATION ABLATION N/A 06/30/2017    Procedure: Atrial Fibrillation Ablation;  Surgeon: Thompson Grayer, MD;  Location: [place] [facility];  Service: Cardiovascular;  Laterality: N/A;   BICEPT TENODESIS Right 05/24/2020   Procedure: BICEPS TENODESIS;  Surgeon: Meredith Pel, MD;  Location: [place];  Service: Orthopedics;  Laterality: Right;   BIOPSY THYROID  1996   CARDIOVERSION N/A 12/07/2013   Procedure: CARDIOVERSION;  Surgeon: Casandra Doffing, MD;  Location: [facility] ENDOSCOPY;  Service: Cardiovascular;  Laterality: N/A;   CARDIOVERSION N/A 03/10/2016   Procedure: CARDIOVERSION;  Surgeon: Thayer Headings, MD;  Location: [facility] ENDOSCOPY;  Service: Cardiovascular;  Laterality: N/A;   ESOPHAGOGASTRODUODENOSCOPY N/A 12/07/2017   Procedure: ESOPHAGOGASTRODUODENOSCOPY (EGD);  Surgeon: Irene Shipper, MD;  Location: [facility] ENDOSCOPY;  Service: Endoscopy;  Laterality: N/A;   EYE MUSCLE SURGERY Right ~ 1966   SHOULDER ARTHROSCOPY WITH ROTATOR CUFF REPAIR Right 05/24/2020   Procedure: right shoulder arthroscopy, rotator cuff tear repair biceps tenodesis;  Surgeon: Meredith Pel, MD;  Location: [place];  Service: Orthopedics;  Laterality: Right;   TEE WITHOUT CARDIOVERSION N/A 06/29/2017   Procedure: TRANSESOPHAGEAL ECHOCARDIOGRAM (TEE);  Surgeon: Fay Records, MD;  Location: [place];  Service: Cardiovascular;  Laterality: N/A;   TONSILLECTOMY  1971    Home Medications:  Allergies as of 09/06/2021       Reactions   Jardiance [empagliflozin] Other (See Comments)   Yeast infections   Codeine Itching, Rash  Medication List        Accurate as of September 06, 2021 11:31 AM. If you have any questions, ask your nurse or doctor.          STOP taking these medications    OneTouch Verio Flex System w/Device Kit Stopped by: Abbie Sons, MD       TAKE these medications    atorvastatin 40 MG tablet Commonly known as: LIPITOR Take 1 tablet by mouth once daily   diclofenac Sodium 1 %  Gel Commonly known as: VOLTAREN Apply 2 g topically 4 (four) times daily. What changed: Another medication with the same name was removed. Continue taking this medication, and follow the directions you see here. Changed by: Abbie Sons, MD   DULoxetine 30 MG capsule Commonly known as: CYMBALTA Take 1 capsule by mouth once daily   glipiZIDE 5 MG 24 hr tablet Commonly known as: GLUCOTROL XL Take 1 tablet (5 mg total) by mouth daily with breakfast.   Lantus SoloStar 100 UNIT/ML Solostar Pen Generic drug: insulin glargine INJECT 80 UNITS SUBCUTANEOUSLY AT BEDTIME   lisinopril 10 MG tablet Commonly known as: ZESTRIL Take 1 tablet by mouth once daily   metFORMIN 1000 MG tablet Commonly known as: GLUCOPHAGE TAKE 1 TABLET BY MOUTH TWICE DAILY WITH A MEAL   metoCLOPramide 5 MG tablet Commonly known as: Reglan Take 1 tablet (5 mg total) by mouth 3 (three) times daily before meals.   metoprolol tartrate 25 MG tablet Commonly known as: LOPRESSOR Take 1.5 tablets (37.5 mg total) by mouth 2 (two) times daily.   mupirocin ointment 2 % Commonly known as: BACTROBAN Apply 1 application topically daily.   omeprazole 20 MG capsule Commonly known as: PRILOSEC Take 1 capsule (20 mg total) by mouth daily.   OneTouch Delica Lancets 35T Misc Use as instructed to check blood sugar up to 3 times daily.   OneTouch Verio test strip Generic drug: glucose blood USE STRIPS TO CHECK GLUCOSE UP TO THREE TIMES DAILY AS DIRECTED   Ozempic (2 MG/DOSE) 8 MG/3ML Sopn Generic drug: Semaglutide (2 MG/DOSE) Inject 2 mg into the skin once a week.   rivaroxaban 20 MG Tabs tablet Commonly known as: Xarelto Take 1 tablet (20 mg total) by mouth daily with supper.   Skyrizi (150 MG Dose) 75 MG/0.83ML Pskt Generic drug: Risankizumab-rzaa(150 MG Dose) Inject 150 mg as directed See admin instructions. 4x's per year   TRUEplus Pen Needles 32G X 4 MM Misc Generic drug: Insulin Pen Needle USE AS  DIRECTED AT BEDTIME.        Allergies:  Allergies  Allergen Reactions   Jardiance [Empagliflozin] Other (See Comments)    Yeast infections   Codeine Itching and Rash    Family History: Family History  Adopted: Yes  Problem Relation Age of Onset   Hypertension Mother    Hyperlipidemia Mother     Social History:  reports that she quit smoking about 10 years ago. Her smoking use included cigarettes. She has a 22.00 pack-year smoking history. She has never used smokeless tobacco. She reports current alcohol use. She reports that she does not use drugs.   Physical Exam: BP 130/82   Pulse 89   Ht 5' 5.5" (1.664 m)   Wt 234 lb (106.1 kg)   LMP 04/15/2015 (LMP Unknown) Comment: last in may  BMI 38.35 kg/m   Constitutional:  Alert and oriented, No acute distress. HEENT: [place] AT, moist mucus membranes.  Trachea midline, no masses. Cardiovascular:  No clubbing, cyanosis, or edema. Respiratory: Normal respiratory effort, no increased work of breathing. Psychiatric: Normal mood and affect.  Laboratory Data:  Urinalysis 09/06/2021: Dipstick 2+ glucose/microscopy negative  Assessment & Plan:    1.  Gross hematuria AUA hematuria risk stratification: High We discussed the recommended evaluation for high risk hematuria to include CT urogram and cystoscopy Each procedure was discussed in detail and she has elected to proceed   Abbie Sons, MD  [place] [address], [place], [place] [address] [phone]

## 2021-09-11 ENCOUNTER — Other Ambulatory Visit: Payer: Self-pay | Admitting: "Endocrinology

## 2021-09-11 ENCOUNTER — Ambulatory Visit (INDEPENDENT_AMBULATORY_CARE_PROVIDER_SITE_OTHER): Payer: 59 | Admitting: "Endocrinology

## 2021-09-11 ENCOUNTER — Encounter: Payer: Self-pay | Admitting: "Endocrinology

## 2021-09-11 ENCOUNTER — Other Ambulatory Visit: Payer: Self-pay

## 2021-09-11 VITALS — BP 112/78 | HR 72 | Ht 65.5 in | Wt 233.2 lb

## 2021-09-11 DIAGNOSIS — E559 Vitamin D deficiency, unspecified: Secondary | ICD-10-CM

## 2021-09-11 DIAGNOSIS — I10 Essential (primary) hypertension: Secondary | ICD-10-CM

## 2021-09-11 DIAGNOSIS — E1159 Type 2 diabetes mellitus with other circulatory complications: Secondary | ICD-10-CM

## 2021-09-11 DIAGNOSIS — E782 Mixed hyperlipidemia: Secondary | ICD-10-CM | POA: Diagnosis not present

## 2021-09-11 DIAGNOSIS — E1142 Type 2 diabetes mellitus with diabetic polyneuropathy: Secondary | ICD-10-CM

## 2021-09-11 DIAGNOSIS — Z794 Long term (current) use of insulin: Secondary | ICD-10-CM

## 2021-09-11 LAB — POCT GLYCOSYLATED HEMOGLOBIN (HGB A1C): HbA1c, POC (controlled diabetic range): 8.5 % — AB (ref 0.0–7.0)

## 2021-09-11 MED ORDER — LANTUS SOLOSTAR 100 UNIT/ML ~~LOC~~ SOPN: PEN_INJECTOR | SUBCUTANEOUS | 1 refills | Status: DC

## 2021-09-11 NOTE — Progress Notes (Signed)
05/01/2021, 4:45 PM  Endocrinology follow-up note   Subjective:    Patient ID: Kelly Lang, female    DOB: 06-11-1961.  Kelly Lang is being seen in follow-up after she was seen in consultation for management of currently uncontrolled symptomatic diabetes requested by  Kelly Pier, MD.   Past Medical History:  Diagnosis Date   Arthritis    "all over" (06/30/2017)   Chicken pox    Dyslipidemia    Dysrhythmia ablation 2018   a-fib   GERD (gastroesophageal reflux disease)    H/O: hypothyroidism    a. thyroid biopsy 1996 with synthroid. Stopped taking rx and was loss to follow up b. normal thyroid function 10/20/13   High cholesterol    Hx of cardiovascular stress test    ETT/Lexiscan Myoview (12/2013):  No ischemia; not gated; low risk.   Hypertension    Obesity    Persistent atrial fibrillation ([place])    a diagnosed 10/25/2013   Psoriasis    on Skyrizi   Seasonal allergies    Stroke [facility]) 2016   "some speech problems since" (06/30/2017)   Tachycardia induced cardiomyopathy (HCC)    a. 2/2 Afib with RVR for unknown duration (EF: 40-45%)   Thyroid disease    Type II diabetes mellitus ([place])    a. hg A1c 10, newly diagnosed (10/26/13)    Past Surgical History:  Procedure Laterality Date   ATRIAL FIBRILLATION ABLATION  06/30/2017   ATRIAL FIBRILLATION ABLATION N/A 06/30/2017   Procedure: Atrial Fibrillation Ablation;  Surgeon: Thompson Grayer, MD;  Location: [place] [facility];  Service: Cardiovascular;  Laterality: N/A;   BICEPT TENODESIS Right 05/24/2020   Procedure: BICEPS TENODESIS;  Surgeon: Meredith Pel, MD;  Location: [place];  Service: Orthopedics;  Laterality: Right;   BIOPSY THYROID  1996   CARDIOVERSION N/A 12/07/2013   Procedure: CARDIOVERSION;  Surgeon: Casandra Doffing, MD;  Location: [facility] ENDOSCOPY;  Service: Cardiovascular;  Laterality: N/A;    CARDIOVERSION N/A 03/10/2016   Procedure: CARDIOVERSION;  Surgeon: Thayer Headings, MD;  Location: [facility] ENDOSCOPY;  Service: Cardiovascular;  Laterality: N/A;   ESOPHAGOGASTRODUODENOSCOPY N/A 12/07/2017   Procedure: ESOPHAGOGASTRODUODENOSCOPY (EGD);  Surgeon: Irene Shipper, MD;  Location: [facility] ENDOSCOPY;  Service: Endoscopy;  Laterality: N/A;   EYE MUSCLE SURGERY Right ~ 1966   SHOULDER ARTHROSCOPY WITH ROTATOR CUFF REPAIR Right 05/24/2020   Procedure: right shoulder arthroscopy, rotator cuff tear repair biceps tenodesis;  Surgeon: Meredith Pel, MD;  Location: [place];  Service: Orthopedics;  Laterality: Right;   TEE WITHOUT CARDIOVERSION N/A 06/29/2017   Procedure: TRANSESOPHAGEAL ECHOCARDIOGRAM (TEE);  Surgeon: Fay Records, MD;  Location: [facility] ENDOSCOPY;  Service: Cardiovascular;  Laterality: N/A;   TONSILLECTOMY  1971    Social History   Socioeconomic History   Marital status: Divorced    Spouse name: Not on file   Number of children: 0   Years of education: 14   Highest education level: Not on file  Occupational History   Occupation: Chief Technology Officer  Tobacco Use   Smoking status: Former Smoker    Packs/day: 1.00  Years: 22.00    Pack years: 22.00    Types: Cigarettes    Quit date: 12/01/2010    Years since quitting: 10.4   Smokeless tobacco: Never Used  Vaping Use   Vaping Use: Never used  Substance and Sexual Activity   Alcohol use: Yes    Comment: social   Drug use: No   Sexual activity: Not Currently  Other Topics Concern   Not on file  Social History Narrative   Moved to [place] from [place] in 2002.     Fun: shop, sew, decorate   Denies any beliefs effecting healthcare   Social Determinants of Health   Financial Resource Strain: Not on file  Food Insecurity: Not on file  Transportation Needs: Not on file  Physical Activity: Not on file  Stress: Not on file  Social Connections: Not on file    Family History  Adopted: Yes  Problem  Relation Age of Onset   Hypertension Mother    Hyperlipidemia Mother     Outpatient Encounter Medications as of 05/01/2021  Medication Sig   Semaglutide, 2 MG/DOSE, (OZEMPIC, 2 MG/DOSE,) 8 MG/3ML SOPN Inject 2 mg into the skin once a week.   atorvastatin (LIPITOR) 40 MG tablet Take 1 tablet by mouth once daily   Blood Glucose Monitoring Suppl ([place]) w/Device KIT Use as instructed to check blood sugar up to 3 times daily.   diclofenac sodium (VOLTAREN) 1 % GEL APPLY 2 GRAMS TOPICALLY 4 (FOUR) TIMES DAILY AS NEEDED (PAIN).   DULoxetine (CYMBALTA) 30 MG capsule Take 1 capsule (30 mg total) by mouth daily.   glipiZIDE (GLUCOTROL XL) 5 MG 24 hr tablet Take 1 tablet (5 mg total) by mouth daily with breakfast.   LANTUS SOLOSTAR 100 UNIT/ML Solostar Pen INJECT 80 UNITS SUBCUTANEOUSLY AT BEDTIME   lisinopril (ZESTRIL) 10 MG tablet Take 1 tablet (10 mg total) by mouth daily.   metFORMIN (GLUCOPHAGE) 1000 MG tablet TAKE 1 TABLET BY MOUTH TWICE DAILY WITH A MEAL   metoCLOPramide (REGLAN) 5 MG tablet Take 1 tablet (5 mg total) by mouth 3 (three) times daily before meals.   metoprolol tartrate (LOPRESSOR) 25 MG tablet Take 1.5 tablets (37.5 mg total) by mouth 2 (two) times daily.   omeprazole (PRILOSEC) 20 MG capsule Take 1 capsule (20 mg total) by mouth daily.   ONETOUCH DELICA LANCETS 26Z MISC Use as instructed to check blood sugar up to 3 times daily.   ONETOUCH VERIO test strip USE STRIPS TO CHECK GLUCOSE UP TO THREE TIMES DAILY AS DIRECTED   rivaroxaban (XARELTO) 20 MG TABS tablet Take 1 tablet (20 mg total) by mouth daily with supper.   SKYRIZI, 150 MG DOSE, 75 MG/0.83ML PSKT Inject 150 mg as directed See admin instructions. 4x's per year   TRUEPLUS PEN NEEDLES 32G X 4 MM MISC USE AS DIRECTED AT BEDTIME.   [DISCONTINUED] Fluticasone-Salmeterol (ADVAIR DISKUS) 250-50 MCG/DOSE AEPB Inhale 1 puff into the lungs 2 (two) times daily.   [DISCONTINUED] OZEMPIC, 1 MG/DOSE, 4 MG/3ML SOPN  INJECT 1 MG INTO THE SKIN ONCE A WEEK   [DISCONTINUED] Vitamin D, Ergocalciferol, (DRISDOL) 1.25 MG (50000 UNIT) CAPS capsule Take 1 capsule (50,000 Units total) by mouth every 7 (seven) days.   No facility-administered encounter medications on file as of 05/01/2021.    ALLERGIES: Allergies  Allergen Reactions   Jardiance [Empagliflozin] Other (See Comments)    Yeast infections   Codeine Itching and Rash    VACCINATION STATUS: Immunization  History  Administered Date(s) Administered   Influenza,inj,Quad PF,6+ Mos 11/19/2016, 09/07/2017, 08/09/2018, 08/16/2020   Moderna Sars-Covid-2 Vaccination 01/04/2020, 02/01/2020, 10/12/2020   Pneumococcal Polysaccharide-23 06/16/2016   Tdap 06/16/2016    Diabetes She presents for her follow-up diabetic visit. She has type 2 diabetes mellitus. Onset time: She was diagnosed at approximate age of 52 years. Her disease course has been worsening. There are no hypoglycemic associated symptoms. Pertinent negatives for hypoglycemia include no confusion, headaches, pallor or seizures. Pertinent negatives for diabetes include no chest pain, no fatigue, no polydipsia, no polyphagia and no polyuria. There are no hypoglycemic complications. Symptoms are worsening. Diabetic complications include a CVA. Risk factors for coronary artery disease include dyslipidemia, diabetes mellitus, obesity, hypertension, post-menopausal, sedentary lifestyle and tobacco exposure. Current diabetic treatment includes insulin injections (She is currently on Levemir 155 units daily, Ozempic 1 mg weekly, glipizide 2.5 mg daily, Metformin 1000 mg p.o. twice daily.). Her weight is increasing steadily. She is following a generally unhealthy diet. When asked about meal planning, she reported none. She has had a previous visit with a dietitian. She participates in exercise intermittently. Her home blood glucose trend is fluctuating minimally. Her breakfast blood glucose range is generally 180-200  mg/dl. Her bedtime blood glucose range is generally 180-200 mg/dl. Her overall blood glucose range is 180-200 mg/dl. (She presents with rare, random monitoring of blood glucose between 150-250.  Her point-of-care A1c is unchanged at 8.5%.  She did not document or report hypoglycemia.    ) An ACE inhibitor/angiotensin II receptor blocker is being taken. Eye exam is current.  Hypertension This is a chronic problem. The current episode started more than 1 year ago. The problem is controlled. Pertinent negatives include no chest pain, headaches, palpitations or shortness of breath. Risk factors for coronary artery disease include diabetes mellitus, dyslipidemia, obesity, sedentary lifestyle, smoking/tobacco exposure and post-menopausal state. Past treatments include ACE inhibitors and beta blockers. Hypertensive end-organ damage includes CVA.  Hyperlipidemia This is a chronic problem. The current episode started more than 1 year ago. The problem is uncontrolled. Exacerbating diseases include diabetes and obesity. Pertinent negatives include no chest pain, myalgias or shortness of breath. Risk factors for coronary artery disease include dyslipidemia, diabetes mellitus, hypertension, obesity, a sedentary lifestyle and post-menopausal.    Review of Systems  Constitutional:  Negative for chills, fatigue, fever and unexpected weight change.  HENT:  Negative for trouble swallowing and voice change.   Eyes:  Negative for visual disturbance.  Respiratory:  Negative for cough, shortness of breath and wheezing.   Cardiovascular:  Negative for chest pain, palpitations and leg swelling.  Gastrointestinal:  Negative for diarrhea, nausea and vomiting.  Endocrine: Negative for cold intolerance, heat intolerance, polydipsia, polyphagia and polyuria.  Musculoskeletal:  Negative for arthralgias and myalgias.  Skin:  Negative for color change, pallor, rash and wound.  Neurological:  Negative for seizures and headaches.   Psychiatric/Behavioral:  Negative for confusion and suicidal ideas.    Objective:    Vitals with BMI 05/01/2021 02/26/2021 02/05/2021  Height 5' 5.5" 5' 5.5" -  Weight 240 lbs 13 oz 235 lbs 13 oz 237 lbs 13 oz  BMI 29.24 46.28 -  Systolic 638 177 116  Diastolic 74 82 84  Pulse 60 93 79    BP 128/74   Pulse 60   Ht 5' 5.5" (1.664 m)   Wt 240 lb 12.8 oz (109.2 kg)   LMP 04/15/2015 (LMP Unknown) Comment: last in may  BMI 39.46 kg/m  Wt Readings from Last 3 Encounters:  05/01/21 240 lb 12.8 oz (109.2 kg)  02/26/21 235 lb 12.8 oz (107 kg)  02/05/21 237 lb 12.8 oz (107.9 kg)     Physical Exam Constitutional:      Appearance: She is well-developed.  HENT:     Head: Normocephalic and atraumatic.  Neck:     Thyroid: No thyromegaly.     Trachea: No tracheal deviation.  Cardiovascular:     Rate and Rhythm: Normal rate and regular rhythm.  Pulmonary:     Effort: Pulmonary effort is normal.  Abdominal:     Tenderness: There is no abdominal tenderness. There is no guarding.  Musculoskeletal:        General: Normal range of motion.     Cervical back: Normal range of motion and neck supple.  Skin:    General: Skin is warm and dry.     Coloration: Skin is not pale.     Findings: No erythema or rash.  Neurological:     Mental Status: She is alert and oriented to person, place, and time.     Cranial Nerves: No cranial nerve deficit.     Coordination: Coordination normal.     Deep Tendon Reflexes: Reflexes are normal and symmetric.     Comments: Diminished monofilament test sensation on bilateral lower extremities. Her dorsalis pedis and posterior tibial arterial pulses are normal.  Psychiatric:        Judgment: Judgment normal.      CMP ( most recent) CMP     Component Value Date/Time   NA 139 12/25/2020 0802   K 4.2 12/25/2020 0802   CL 104 12/25/2020 0802   CO2 20 12/25/2020 0802   GLUCOSE 141 (H) 12/25/2020 0802   GLUCOSE 155 (H) 05/21/2020 0900   BUN 10 12/25/2020  0802   CREATININE 0.58 12/25/2020 0802   CREATININE 0.90 06/16/2016 1128   CALCIUM 8.8 12/25/2020 0802   PROT 6.8 12/25/2020 0802   ALBUMIN 3.9 12/25/2020 0802   AST 18 12/25/2020 0802   ALT 34 (H) 12/25/2020 0802   ALKPHOS 67 12/25/2020 0802   BILITOT 0.4 12/25/2020 0802   GFRNONAA 103 12/25/2020 0802   GFRNONAA 73 06/16/2016 1128   GFRAA 118 12/25/2020 0802   GFRAA 84 06/16/2016 1128     Diabetic Labs (most recent): Lab Results  Component Value Date   HGBA1C 8.4 (A) 05/01/2021   HGBA1C 8.3 (A) 09/24/2020   HGBA1C 8.5 (A) 06/20/2020     Lipid Panel ( most recent) Lipid Panel     Component Value Date/Time   CHOL 115 12/25/2020 0802   TRIG 208 (H) 12/25/2020 0802   HDL 32 (L) 12/25/2020 0802   CHOLHDL 3.6 12/25/2020 0802   CHOLHDL 5.2 (H) 11/19/2016 1103   VLDL 59 (H) 11/19/2016 1103   LDLCALC 49 12/25/2020 0802   LDLDIRECT 114.0 02/13/2015 0750   LABVLDL 34 12/25/2020 0802      Lab Results  Component Value Date   TSH 0.597 12/25/2020   TSH 1.01 09/16/2019   TSH 0.718 12/26/2015   TSH 0.950 09/15/2015   TSH 0.95 11/13/2014   TSH 1.020 10/25/2013   FREET4 0.99 12/25/2020   FREET4 0.98 12/26/2015   FREET4 0.98 10/25/2013      Assessment & Plan:   1. DM type 2 causing vascular disease ([place]) - Kelly Lang has currently uncontrolled symptomatic type 2 DM since  60 years of age.  She presents with rare, random monitoring of blood  glucose between 150-250.  Her point-of-care A1c is unchanged at 8.5%.  She did not document or report hypoglycemia.      - I had a long discussion with her about the progressive nature of diabetes and the pathology behind its complications. -her diabetes is complicated by CVA, peripheral neuropathy and she remains at a high risk for more acute and chronic complications which include CAD, CVA, CKD, retinopathy, and neuropathy. These are all discussed in detail with her.  - I have counseled her on diet  and weight management  by  adopting a carbohydrate restricted/protein rich diet. Patient is encouraged to switch to  unprocessed or minimally processed     complex starch and increased protein intake (animal or plant source), fruits, and vegetables. -  she is advised to stick to a routine mealtimes to eat 3 meals  a day and avoid unnecessary snacks ( to snack only to correct hypoglycemia).   - she acknowledges that there is a room for improvement in her food and drink choices. - Suggestion is made for her to avoid simple carbohydrates  from her diet including Cakes, Sweet Desserts, Ice Cream, Soda (diet and regular), Sweet Tea, Candies, Chips, Cookies, Store Bought Juices, Alcohol in Excess of  1-2 drinks a day, Artificial Sweeteners,  Coffee Creamer, and "Sugar-free" Products, Lemonade. This will help patient to have more stable blood glucose profile and potentially avoid unintended weight gain.   - she reports that she has had enough encounters with dietitian in the past.    - I have approached her with the following individualized plan to manage  her diabetes and patient agrees:   - she admits to have been inconsistent about her dietary precautions.  She will continue to need insulin treatment in order for her to achieve control of diabetes to target.  She is advised to continue Lantus 80 units nightly, associated with monitoring of blood glucose twice a day-daily before breakfast and at bedtime.   She will not need prandial insulin for now.    - she is warned not to take insulin without proper monitoring per orders.  - she is encouraged to call clinic for blood glucose levels less than 70 or above 200 mg /dl. - she is advised to continue metformin 1000 mg p.o. twice daily-daily after breakfast and after supper.   -She is tolerating Ozempic.  She is advised to continue Ozempic 2 mg subcutaneously weekly, and continue glipizide 5 mg XL p.o. daily at breakfast.   - Specific targets for  A1c;  LDL, HDL,  and  Triglycerides were discussed with the patient.  2) Blood Pressure /Hypertension: Her blood pressure is controlled to target.  she is advised to continue her current medications including lisinopril 10 mg p.o. daily, metoprolol 25 mg p.o. twice daily.   3) Lipids/Hyperlipidemia:   Review of her recent lipid panel showed improved LDL at 49, overall improving from 114.  She is benefiting from statin.  Advised to continue atorvastatin 40 mg p.o. daily at bedtime  .  Side effects and precautions discussed with her.     4)  Weight/Diet:  Body mass index is 39.46 kg/m.  -   clearly complicating her diabetes care.   she is  a candidate for weight loss. I discussed with her the fact that loss of 5 - 10% of her  current body weight will have the most impact on her diabetes management.  Exercise, and detailed carbohydrates information provided  -  detailed on  discharge instructions. She will benefit from bariatric weight management, which  is briefly discussed with her.  She is given information brochure and contact information for [facility] bariatric team.    5) Chronic Care/Health Maintenance:  -she  is on ACEI/ARB and Statin medications and  is encouraged to initiate and continue to follow up with Ophthalmology, Dentist,  Podiatrist at least yearly or according to recommendations, and advised to   stay away from smoking. I have recommended yearly flu vaccine and pneumonia vaccine at least every 5 years; moderate intensity exercise for up to 150 minutes weekly; and  sleep for at least 7 hours a day.  Her screening ABI was negative for PAD in January 2022, the study will be repeated in January 2027, or sooner if needed.  - she is  advised to maintain close follow up with Kelly Pier, MD for primary care needs, as well as her other providers for optimal and coordinated care.    I spent 44 minutes in the care of the patient today including review of labs from [place], Lipids, Thyroid Function,  Hematology (current and previous including abstractions from other facilities); face-to-face time discussing  her blood glucose readings/logs, discussing hypoglycemia and hyperglycemia episodes and symptoms, medications doses, her options of short and long term treatment based on the latest standards of care / guidelines;  discussion about incorporating lifestyle medicine;  and documenting the encounter.    Please refer to Patient Instructions for Blood Glucose Monitoring and Insulin/Medications Dosing Guide"  in media tab for additional information. Please  also refer to " Patient Self Inventory" in the Media  tab for reviewed elements of pertinent patient history.  Kelly Lang participated in the discussions, expressed understanding, and voiced agreement with the above plans.  All questions were answered to her satisfaction. she is encouraged to contact clinic should she have any questions or concerns prior to her return visit.    Follow up plan: - Return in about 3 months (around 08/01/2021) for F/U with Pre-visit Labs, Meter, Logs, A1c here.Glade Lloyd, MD [facility] [address] [place], [place] [address] Phone: [phone]  Fax: [phone]    05/01/2021, 4:45 PM  This note was partially dictated with voice recognition software. Similar sounding words can be transcribed inadequately or may not  be corrected upon review.

## 2021-09-11 NOTE — Patient Instructions (Signed)

## 2021-09-27 ENCOUNTER — Other Ambulatory Visit: Payer: Self-pay | Admitting: "Endocrinology

## 2021-09-27 ENCOUNTER — Other Ambulatory Visit: Payer: Self-pay

## 2021-09-27 ENCOUNTER — Ambulatory Visit (HOSPITAL_COMMUNITY)
Admission: RE | Admit: 2021-09-27 | Discharge: 2021-09-27 | Disposition: A | Discharge status: Home / Self Care | Payer: 59 | Source: Ambulatory Visit | Attending: Urology | Admitting: Urology

## 2021-09-27 ENCOUNTER — Encounter (HOSPITAL_COMMUNITY): Payer: Self-pay

## 2021-09-27 DIAGNOSIS — R310 Gross hematuria: Secondary | ICD-10-CM | POA: Diagnosis not present

## 2021-09-27 LAB — POCT I-STAT CREATININE: Creatinine, Ser: 0.6 mg/dL (ref 0.44–1.00)

## 2021-09-27 MED ORDER — IOHEXOL 350 MG/ML SOLN
80.0000 mL | Freq: Once | INTRAVENOUS | Status: AC | PRN
  Administered 2021-09-27: 80 mL via INTRAVENOUS

## 2021-09-27 MED ORDER — SODIUM CHLORIDE 0.9 % IV SOLN
INTRAVENOUS | Status: AC
  Filled 2021-09-27: qty 250

## 2021-10-04 ENCOUNTER — Encounter: Payer: Self-pay | Admitting: Urology

## 2021-10-04 ENCOUNTER — Ambulatory Visit (INDEPENDENT_AMBULATORY_CARE_PROVIDER_SITE_OTHER): Payer: 59 | Admitting: Urology

## 2021-10-04 ENCOUNTER — Other Ambulatory Visit: Payer: Self-pay

## 2021-10-04 VITALS — BP 109/81 | HR 68 | Ht 65.0 in | Wt 230.0 lb

## 2021-10-04 DIAGNOSIS — R310 Gross hematuria: Secondary | ICD-10-CM | POA: Diagnosis not present

## 2021-10-04 LAB — URINALYSIS, COMPLETE
Bilirubin, UA: NEGATIVE
Ketones, UA: NEGATIVE
Leukocytes,UA: NEGATIVE
Nitrite, UA: NEGATIVE
RBC, UA: NEGATIVE
Specific Gravity, UA: 1.025 (ref 1.005–1.030)
Urobilinogen, Ur: 0.2 mg/dL (ref 0.2–1.0)
pH, UA: 5.5 (ref 5.0–7.5)

## 2021-10-04 LAB — MICROSCOPIC EXAMINATION: Bacteria, UA: NONE SEEN

## 2021-10-05 ENCOUNTER — Encounter: Payer: Self-pay | Admitting: Urology

## 2021-10-05 NOTE — Progress Notes (Signed)
   10/05/21  CC:  Chief Complaint  Patient presents with   Cysto   Indications: Gross hematuria  HPI: Refer to office note 09/06/2021.  Denies recurrent gross hematuria.  CTU performed 09/27/2021 showed no upper tract abnormalities  Blood pressure 109/81, pulse 68, height 5\' 5"  (1.651 m), weight 230 lb (104.3 kg), last menstrual period 04/15/2015. NED. A&Ox3.   No respiratory distress   Abd soft, NT, ND Normal external genitalia with patent urethral meatus  Cystoscopy Procedure Note  Patient identification was confirmed, informed consent was obtained, and patient was prepped using Betadine solution.  Lidocaine jelly was administered per urethral meatus.    Procedure: - Flexible cystoscope introduced, without any difficulty.   - Thorough search of the bladder revealed:    normal urethral meatus    normal urothelium    no stones    no ulcers     no tumors    no urethral polyps    no trabeculation  - Ureteral orifices were normal in position and appearance.  Post-Procedure: - Patient tolerated the procedure well  Assessment/ Plan: No upper tract abnormalities on CTU Unremarkable cystoscopy Urine cytology sent 23-month follow-up with repeat urinalysis    8-month, MD

## 2021-10-07 LAB — CYTOLOGY - NON PAP

## 2021-10-08 ENCOUNTER — Encounter: Payer: Self-pay | Admitting: *Deleted

## 2021-10-09 ENCOUNTER — Ambulatory Visit: Payer: 59 | Admitting: "Endocrinology

## 2021-10-14 ENCOUNTER — Telehealth: Payer: Self-pay | Admitting: "Endocrinology

## 2021-10-14 MED ORDER — OZEMPIC (2 MG/DOSE) 8 MG/3ML ~~LOC~~ SOPN: PEN_INJECTOR | SUBCUTANEOUS | 1 refills | Status: DC

## 2021-10-14 NOTE — Telephone Encounter (Signed)
Pt called and states she has been trying to get her ozempic for weeks now and no where seems to have it in stock or have an idea of when they will get it. She is requesting a call back to see if something else could be sent in.

## 2021-10-14 NOTE — Telephone Encounter (Signed)
Discussed with pt, she stated she would like for you to send it there and have them to deliver please.

## 2021-10-14 NOTE — Telephone Encounter (Signed)
When I spoke to the reps about availability of the meds before, they recommended sending scripts for Ozempic to [facility] in [facility].  They even have a free delivery service.  Does she want [place] to try sending it there?

## 2021-10-14 NOTE — Telephone Encounter (Signed)
Please advise 

## 2021-10-22 ENCOUNTER — Ambulatory Visit (INDEPENDENT_AMBULATORY_CARE_PROVIDER_SITE_OTHER): Payer: 59 | Admitting: "Endocrinology

## 2021-10-22 ENCOUNTER — Other Ambulatory Visit: Payer: Self-pay

## 2021-10-22 ENCOUNTER — Encounter: Payer: Self-pay | Admitting: "Endocrinology

## 2021-10-22 VITALS — BP 120/66 | HR 72 | Ht 65.5 in | Wt 237.2 lb

## 2021-10-22 DIAGNOSIS — I10 Essential (primary) hypertension: Secondary | ICD-10-CM

## 2021-10-22 DIAGNOSIS — E782 Mixed hyperlipidemia: Secondary | ICD-10-CM | POA: Diagnosis not present

## 2021-10-22 DIAGNOSIS — E1142 Type 2 diabetes mellitus with diabetic polyneuropathy: Secondary | ICD-10-CM

## 2021-10-22 DIAGNOSIS — Z794 Long term (current) use of insulin: Secondary | ICD-10-CM

## 2021-10-22 NOTE — Progress Notes (Signed)
10/22/2021, 5:38 PM  Endocrinology follow-up note   Subjective:    Patient ID: Kelly Lang, female    DOB: [birth year].  Kelly Lang is being seen in follow-up after she was seen in consultation for management of currently uncontrolled symptomatic diabetes requested by  Ladell Pier, MD.   Past Medical History:  Diagnosis Date   Arthritis    "all over" (06/30/2017)   Chicken pox    Dyslipidemia    Dysrhythmia ablation 2018   a-fib   GERD (gastroesophageal reflux disease)    H/O: hypothyroidism    a. thyroid biopsy 1996 with synthroid. Stopped taking rx and was loss to follow up b. normal thyroid function 10/20/13   High cholesterol    Hx of cardiovascular stress test    ETT/Lexiscan Myoview (12/2013):  No ischemia; not gated; low risk.   Hypertension    Obesity    Persistent atrial fibrillation ([place])    a diagnosed 10/25/2013   Psoriasis    on Skyrizi   Seasonal allergies    Stroke [facility]) 2016   "some speech problems since" (06/30/2017)   Tachycardia induced cardiomyopathy (HCC)    a. 2/2 Afib with RVR for unknown duration (EF: 40-45%)   Thyroid disease    Type II diabetes mellitus ([place])    a. hg A1c 10, newly diagnosed (10/26/13)    Past Surgical History:  Procedure Laterality Date   ATRIAL FIBRILLATION ABLATION  06/30/2017   ATRIAL FIBRILLATION ABLATION N/A 06/30/2017   Procedure: Atrial Fibrillation Ablation;  Surgeon: Thompson Grayer, MD;  Location: [place] [facility];  Service: Cardiovascular;  Laterality: N/A;   BICEPT TENODESIS Right 05/24/2020   Procedure: BICEPS TENODESIS;  Surgeon: Meredith Pel, MD;  Location: [place];  Service: Orthopedics;  Laterality: Right;   BIOPSY THYROID  1996   CARDIOVERSION N/A 12/07/2013   Procedure: CARDIOVERSION;  Surgeon: Casandra Doffing, MD;  Location: [facility] ENDOSCOPY;  Service: Cardiovascular;  Laterality: N/A;    CARDIOVERSION N/A 03/10/2016   Procedure: CARDIOVERSION;  Surgeon: Thayer Headings, MD;  Location: [facility] ENDOSCOPY;  Service: Cardiovascular;  Laterality: N/A;   ESOPHAGOGASTRODUODENOSCOPY N/A 12/07/2017   Procedure: ESOPHAGOGASTRODUODENOSCOPY (EGD);  Surgeon: Irene Shipper, MD;  Location: [facility] ENDOSCOPY;  Service: Endoscopy;  Laterality: N/A;   EYE MUSCLE SURGERY Right ~ 1966   SHOULDER ARTHROSCOPY WITH ROTATOR CUFF REPAIR Right 05/24/2020   Procedure: right shoulder arthroscopy, rotator cuff tear repair biceps tenodesis;  Surgeon: Meredith Pel, MD;  Location: [place];  Service: Orthopedics;  Laterality: Right;   TEE WITHOUT CARDIOVERSION N/A 06/29/2017   Procedure: TRANSESOPHAGEAL ECHOCARDIOGRAM (TEE);  Surgeon: Fay Records, MD;  Location: [facility] ENDOSCOPY;  Service: Cardiovascular;  Laterality: N/A;   TONSILLECTOMY  1971    Social History   Socioeconomic History   Marital status: Divorced    Spouse name: Not on file   Number of children: 0   Years of education: 14   Highest education level: Not on file  Occupational History   Occupation: Chief Technology Officer  Tobacco Use   Smoking status: Former    Packs/day: 1.00  Years: 22.00    Pack years: 22.00    Types: Cigarettes    Quit date: 12/01/2010    Years since quitting: 10.8   Smokeless tobacco: Never  Vaping Use   Vaping Use: Never used  Substance and Sexual Activity   Alcohol use: Yes    Comment: social   Drug use: No   Sexual activity: Not Currently  Other Topics Concern   Not on file  Social History Narrative   Moved to [place] from [place] in 2002.     Fun: shop, sew, decorate   Denies any beliefs effecting healthcare   Social Determinants of Health   Financial Resource Strain: Not on file  Food Insecurity: Not on file  Transportation Needs: Not on file  Physical Activity: Not on file  Stress: Not on file  Social Connections: Not on file    Family History  Adopted: Yes  Problem Relation Age  of Onset   Hypertension Mother    Hyperlipidemia Mother     Outpatient Encounter Medications as of 10/22/2021  Medication Sig   atorvastatin (LIPITOR) 40 MG tablet Take 1 tablet by mouth once daily   diclofenac Sodium (VOLTAREN) 1 % GEL Apply 2 g topically 4 (four) times daily.   DULoxetine (CYMBALTA) 30 MG capsule Take 1 capsule by mouth once daily   glipiZIDE (GLUCOTROL XL) 5 MG 24 hr tablet Take 1 tablet (5 mg total) by mouth daily with breakfast.   LANTUS SOLOSTAR 100 UNIT/ML Solostar Pen INJECT 80 UNITS SUBCUTANEOUSLY AT BEDTIME   lisinopril (ZESTRIL) 10 MG tablet Take 1 tablet by mouth once daily   metFORMIN (GLUCOPHAGE) 1000 MG tablet TAKE 1 TABLET BY MOUTH TWICE DAILY WITH A MEAL   metoCLOPramide (REGLAN) 5 MG tablet Take 1 tablet (5 mg total) by mouth 3 (three) times daily before meals.   metoprolol tartrate (LOPRESSOR) 25 MG tablet Take 1.5 tablets (37.5 mg total) by mouth 2 (two) times daily.   mupirocin ointment (BACTROBAN) 2 % Apply 1 application topically daily.   omeprazole (PRILOSEC) 20 MG capsule Take 1 capsule (20 mg total) by mouth daily.   ONETOUCH DELICA LANCETS 33G MISC Use as instructed to check blood sugar up to 3 times daily.   ONETOUCH VERIO test strip USE STRIPS TO CHECK GLUCOSE UP TO THREE TIMES DAILY AS DIRECTED   rivaroxaban (XARELTO) 20 MG TABS tablet Take 1 tablet (20 mg total) by mouth daily with supper.   Semaglutide, 2 MG/DOSE, (OZEMPIC, 2 MG/DOSE,) 8 MG/3ML SOPN INJECT 2 MG UNDER THE SKIN EVERY WEEK   SKYRIZI, 150 MG DOSE, 75 MG/0.83ML PSKT Inject 150 mg as directed See admin instructions. 4x's per year   TRUEPLUS PEN NEEDLES 32G X 4 MM MISC USE AS DIRECTED AT BEDTIME.   [DISCONTINUED] Fluticasone-Salmeterol (ADVAIR DISKUS) 250-50 MCG/DOSE AEPB Inhale 1 puff into the lungs 2 (two) times daily.   No facility-administered encounter medications on file as of 10/22/2021.    ALLERGIES: Allergies  Allergen Reactions   Jardiance [Empagliflozin] Other (See  Comments)    Yeast infections   Codeine Itching and Rash    VACCINATION STATUS: Immunization History  Administered Date(s) Administered   Influenza,inj,Quad PF,6+ Mos 11/19/2016, 09/07/2017, 08/09/2018, 08/16/2020, 08/23/2021   Moderna Sars-Covid-2 Vaccination 01/04/2020, 02/01/2020, 10/12/2020, 06/09/2021   Pneumococcal Polysaccharide-23 06/16/2016   Tdap 06/16/2016    Diabetes She presents for her follow-up diabetic visit. She has type 2 diabetes mellitus. Onset time: She was diagnosed at approximate age of 50 years. Her disease course  has been worsening. There are no hypoglycemic associated symptoms. Pertinent negatives for hypoglycemia include no confusion, headaches, pallor or seizures. Pertinent negatives for diabetes include no chest pain, no fatigue, no polydipsia, no polyphagia and no polyuria. There are no hypoglycemic complications. Symptoms are improving. Diabetic complications include a CVA. Risk factors for coronary artery disease include dyslipidemia, diabetes mellitus, obesity, hypertension, post-menopausal, sedentary lifestyle and tobacco exposure. Current diabetic treatment includes insulin injections (She is currently on Levemir 155 units daily, Ozempic 1 mg weekly, glipizide 2.5 mg daily, Metformin 1000 mg p.o. twice daily.). Her weight is increasing steadily. She is following a generally unhealthy diet. When asked about meal planning, she reported none. She has had a previous visit with a dietitian. She participates in exercise intermittently. Blood glucose monitoring compliance is inadequate. Her home blood glucose trend is fluctuating minimally. Her breakfast blood glucose range is generally 140-180 mg/dl. Her bedtime blood glucose range is generally 180-200 mg/dl. Her overall blood glucose range is 180-200 mg/dl. (She presents with her meter showing rare, random monitoring of blood glucose between 150-250.  Her point-of-care A1c is unchanged at 8.3%.  She did not document or  report hypoglycemia.    ) An ACE inhibitor/angiotensin II receptor blocker is being taken. Eye exam is current.  Hypertension This is a chronic problem. The current episode started more than 1 year ago. The problem is controlled. Pertinent negatives include no chest pain, headaches, palpitations or shortness of breath. Risk factors for coronary artery disease include diabetes mellitus, dyslipidemia, obesity, sedentary lifestyle, smoking/tobacco exposure and post-menopausal state. Past treatments include ACE inhibitors and beta blockers. Hypertensive end-organ damage includes CVA.  Hyperlipidemia This is a chronic problem. The current episode started more than 1 year ago. The problem is uncontrolled. Exacerbating diseases include diabetes and obesity. Pertinent negatives include no chest pain, myalgias or shortness of breath. Risk factors for coronary artery disease include dyslipidemia, diabetes mellitus, hypertension, obesity, a sedentary lifestyle and post-menopausal.    Review of Systems  Constitutional:  Negative for chills, fatigue, fever and unexpected weight change.  HENT:  Negative for trouble swallowing and voice change.   Eyes:  Negative for visual disturbance.  Respiratory:  Negative for cough, shortness of breath and wheezing.   Cardiovascular:  Negative for chest pain, palpitations and leg swelling.  Gastrointestinal:  Negative for diarrhea, nausea and vomiting.  Endocrine: Negative for cold intolerance, heat intolerance, polydipsia, polyphagia and polyuria.  Musculoskeletal:  Negative for arthralgias and myalgias.  Skin:  Negative for color change, pallor, rash and wound.  Neurological:  Negative for seizures and headaches.  Psychiatric/Behavioral:  Negative for confusion and suicidal ideas.    Objective:    Vitals with BMI 10/22/2021 10/04/2021 09/11/2021  Height 5' 5.5" 5\' 5"  5' 5.5"  Weight 237 lbs 3 oz 230 lbs 233 lbs 3 oz  BMI 38.86 [id number]  Systolic 123456 0000000 [id number]   Diastolic 66 81 78  Pulse 72 68 72    BP 120/66   Pulse 72   Ht 5' 5.5" (1.664 m)   Wt 237 lb 3.2 oz (107.6 kg)   LMP 04/15/2015 (LMP Unknown) Comment: last in may  BMI 38.87 kg/m   Wt Readings from Last 3 Encounters:  10/22/21 237 lb 3.2 oz (107.6 kg)  10/04/21 230 lb (104.3 kg)  09/11/21 233 lb 3.2 oz (105.8 kg)     Physical Exam Constitutional:      Appearance: She is well-developed.  HENT:     Head: Normocephalic and atraumatic.  Neck:     Thyroid: No thyromegaly.     Trachea: No tracheal deviation.  Cardiovascular:     Rate and Rhythm: Normal rate and regular rhythm.  Pulmonary:     Effort: Pulmonary effort is normal.  Abdominal:     Tenderness: There is no abdominal tenderness. There is no guarding.  Musculoskeletal:        General: Normal range of motion.     Cervical back: Normal range of motion and neck supple.  Skin:    General: Skin is warm and dry.     Coloration: Skin is not pale.     Findings: No erythema or rash.  Neurological:     Mental Status: She is alert and oriented to person, place, and time.     Cranial Nerves: No cranial nerve deficit.     Coordination: Coordination normal.     Deep Tendon Reflexes: Reflexes are normal and symmetric.     Comments: Diminished monofilament test sensation on bilateral lower extremities. Her dorsalis pedis and posterior tibial arterial pulses are normal.  Psychiatric:        Judgment: Judgment normal.      CMP ( most recent) CMP     Component Value Date/Time   NA 139 08/01/2021 1628   K 4.1 08/01/2021 1628   CL 101 08/01/2021 1628   CO2 25 08/01/2021 1628   GLUCOSE 162 (H) 08/01/2021 1628   GLUCOSE 155 (H) 05/21/2020 0900   BUN 13 08/01/2021 1628   CREATININE 0.60 09/27/2021 1611   CREATININE 0.90 06/16/2016 1128   CALCIUM 9.3 08/01/2021 1628   PROT 7.5 08/01/2021 1628   ALBUMIN 4.2 08/01/2021 1628   AST 14 08/01/2021 1628   ALT 25 08/01/2021 1628   ALKPHOS 71 08/01/2021 1628   BILITOT 0.3  08/01/2021 1628   GFRNONAA 103 12/25/2020 0802   GFRNONAA 73 06/16/2016 1128   GFRAA 118 12/25/2020 0802   GFRAA 84 06/16/2016 1128     Diabetic Labs (most recent): Lab Results  Component Value Date   HGBA1C 8.5 (A) 09/11/2021   HGBA1C 8.4 (A) 05/01/2021   HGBA1C 8.3 (A) 09/24/2020     Lipid Panel ( most recent) Lipid Panel     Component Value Date/Time   CHOL 115 12/25/2020 0802   TRIG 208 (H) 12/25/2020 0802   HDL 32 (L) 12/25/2020 0802   CHOLHDL 3.6 12/25/2020 0802   CHOLHDL 5.2 (H) 11/19/2016 1103   VLDL 59 (H) 11/19/2016 1103   LDLCALC 49 12/25/2020 0802   LDLDIRECT 114.0 02/13/2015 0750   LABVLDL 34 12/25/2020 0802      Lab Results  Component Value Date   TSH 0.597 12/25/2020   TSH 1.01 09/16/2019   TSH 0.718 12/26/2015   TSH 0.950 09/15/2015   TSH 0.95 11/13/2014   TSH 1.020 10/25/2013   FREET4 0.99 12/25/2020   FREET4 0.98 12/26/2015   FREET4 0.98 10/25/2013      Assessment & Plan:   1. DM type 2 causing vascular disease ([place]) - Laporchia Njoroge has currently uncontrolled symptomatic type 2 DM since  60 years of age.  She presents with her meter showing rare, random monitoring of blood glucose between 150-250.  Her point-of-care A1c is unchanged at 8.3%.  She did not document or report hypoglycemia.      - I had a long discussion with her about the progressive nature of diabetes and the pathology behind its complications. -her diabetes is complicated by CVA, peripheral neuropathy and she remains  at a high risk for more acute and chronic complications which include CAD, CVA, CKD, retinopathy, and neuropathy. These are all discussed in detail with her.  - I have counseled her on diet  and weight management  by adopting a carbohydrate restricted/protein rich diet. Patient is encouraged to switch to  unprocessed or minimally processed     complex starch and increased protein intake (animal or plant source), fruits, and vegetables. -  she is advised to stick  to a routine mealtimes to eat 3 meals  a day and avoid unnecessary snacks ( to snack only to correct hypoglycemia).   - she acknowledges that there is a room for improvement in her food and drink choices. - Suggestion is made for her to avoid simple carbohydrates  from her diet including Cakes, Sweet Desserts, Ice Cream, Soda (diet and regular), Sweet Tea, Candies, Chips, Cookies, Store Bought Juices, Alcohol in Excess of  1-2 drinks a day, Artificial Sweeteners,  Coffee Creamer, and "Sugar-free" Products, Lemonade. This will help patient to have more stable blood glucose profile and potentially avoid unintended weight gain.   - she reports that she has had enough encounters with dietitian in the past.    - I have approached her with the following individualized plan to manage  her diabetes and patient agrees:   - she admits to have been inconsistent about her dietary precautions.  I had a long discussion with her about [employer] plant-based lifestyle nutrition.    She will continue to need insulin treatment in order for her to achieve control of diabetes to target.  She is advised to continue Lantus 80 units nightly,  associated with monitoring of blood glucose twice a day-daily before breakfast and at bedtime.   She will not need prandial insulin for now.  She has hard time to commit for proper monitoring of blood glucose. - she is warned not to take insulin without proper monitoring per orders.  - she is encouraged to call clinic for blood glucose levels less than 70 or above 200 mg /dl. - she is advised to continue metformin 1000 mg p.o. twice daily-daily after breakfast and after supper.   -She is tolerating Ozempic.  She is advised to continue Ozempic 2 mg subcutaneous weekly, glipizide 5 mg XL p.o. daily at breakfast.    - Specific targets for  A1c;  LDL, HDL,  and Triglycerides were discussed with the patient.  2) Blood Pressure /Hypertension: Her blood pressure is controlled to  target.  she is advised to continue her current medications including lisinopril 10 mg p.o. daily, metoprolol 25 mg p.o. twice daily.   3) Lipids/Hyperlipidemia:   Review of her recent lipid panel showed improved LDL at 49, overall improving from 114.  She is benefiting from statin.  Advised to continue atorvastatin 40 mg p.o. daily at bedtime  .  Side effects and precautions discussed with her.     4)  Weight/Diet:  Body mass index is 38.87 kg/m.  -   clearly complicating her diabetes care.   she is  a candidate for weight loss. I discussed with her the fact that loss of 5 - 10% of her  current body weight will have the most impact on her diabetes management.  Exercise, and detailed carbohydrates information provided  -  detailed on discharge instructions. She will benefit from bariatric weight management, which  is briefly discussed with her.  She is given information brochure and contact information for [employer] bariatric  team.    5) Chronic Care/Health Maintenance:  -she  is on ACEI/ARB and Statin medications and  is encouraged to initiate and continue to follow up with Ophthalmology, Dentist,  Podiatrist at least yearly or according to recommendations, and advised to   stay away from smoking. I have recommended yearly flu vaccine and pneumonia vaccine at least every 5 years; moderate intensity exercise for up to 150 minutes weekly; and  sleep for at least 7 hours a day.  Her screening ABI was negative for PAD in January 2022, the study will be repeated in January 2027, or sooner if needed.  - she is  advised to maintain close follow up with Ladell Pier, MD for primary care needs, as well as her other providers for optimal and coordinated care.   I spent 40 minutes in the care of the patient today including review of labs from [place], Lipids, Thyroid Function, Hematology (current and previous including abstractions from other facilities); face-to-face time discussing  her blood  glucose readings/logs, discussing hypoglycemia and hyperglycemia episodes and symptoms, medications doses, her options of short and long term treatment based on the latest standards of care / guidelines;  discussion about incorporating lifestyle medicine;  and documenting the encounter.    Please refer to Patient Instructions for Blood Glucose Monitoring and Insulin/Medications Dosing Guide"  in media tab for additional information. Please  also refer to " Patient Self Inventory" in the Media  tab for reviewed elements of pertinent patient history.  Kelly Lang participated in the discussions, expressed understanding, and voiced agreement with the above plans.  All questions were answered to her satisfaction. she is encouraged to contact clinic should she have any questions or concerns prior to her return visit.    Follow up plan: - Return in about 3 months (around 01/22/2022) for F/U with Pre-visit Labs, Meter, Logs, A1c here.Glade Lloyd, MD [facility] [address] [place], [place] [address] Phone: [phone]  Fax: [phone]    10/22/2021, 5:38 PM  This note was partially dictated with voice recognition software. Similar sounding words can be transcribed inadequately or may not  be corrected upon review.

## 2021-10-22 NOTE — Patient Instructions (Signed)

## 2021-11-01 ENCOUNTER — Other Ambulatory Visit: Payer: Self-pay | Admitting: Internal Medicine

## 2021-11-01 DIAGNOSIS — M2550 Pain in unspecified joint: Secondary | ICD-10-CM

## 2021-11-01 DIAGNOSIS — E785 Hyperlipidemia, unspecified: Secondary | ICD-10-CM

## 2021-12-03 ENCOUNTER — Other Ambulatory Visit: Payer: Self-pay | Admitting: "Endocrinology

## 2021-12-03 DIAGNOSIS — E1159 Type 2 diabetes mellitus with other circulatory complications: Secondary | ICD-10-CM

## 2021-12-25 LAB — HM DIABETES EYE EXAM

## 2022-01-06 ENCOUNTER — Other Ambulatory Visit: Payer: Self-pay

## 2022-01-06 ENCOUNTER — Other Ambulatory Visit: Payer: Self-pay | Admitting: "Endocrinology

## 2022-01-06 DIAGNOSIS — Z794 Long term (current) use of insulin: Secondary | ICD-10-CM

## 2022-01-06 DIAGNOSIS — E1142 Type 2 diabetes mellitus with diabetic polyneuropathy: Secondary | ICD-10-CM

## 2022-01-06 MED ORDER — INSULIN GLARGINE-YFGN 100 UNIT/ML ~~LOC~~ SOPN: 80.0000 [IU] | PEN_INJECTOR | Freq: Every day | SUBCUTANEOUS | 0 refills | Status: DC

## 2022-01-22 ENCOUNTER — Encounter: Payer: Self-pay | Admitting: "Endocrinology

## 2022-01-22 ENCOUNTER — Ambulatory Visit (INDEPENDENT_AMBULATORY_CARE_PROVIDER_SITE_OTHER): Payer: 59 | Admitting: "Endocrinology

## 2022-01-22 ENCOUNTER — Other Ambulatory Visit: Payer: Self-pay

## 2022-01-22 VITALS — BP 126/72 | HR 68 | Ht 65.5 in | Wt 233.6 lb

## 2022-01-22 DIAGNOSIS — E1142 Type 2 diabetes mellitus with diabetic polyneuropathy: Secondary | ICD-10-CM | POA: Diagnosis not present

## 2022-01-22 DIAGNOSIS — E782 Mixed hyperlipidemia: Secondary | ICD-10-CM

## 2022-01-22 DIAGNOSIS — E559 Vitamin D deficiency, unspecified: Secondary | ICD-10-CM

## 2022-01-22 DIAGNOSIS — Z794 Long term (current) use of insulin: Secondary | ICD-10-CM | POA: Diagnosis not present

## 2022-01-22 DIAGNOSIS — I10 Essential (primary) hypertension: Secondary | ICD-10-CM

## 2022-01-22 LAB — POCT GLYCOSYLATED HEMOGLOBIN (HGB A1C): HbA1c, POC (controlled diabetic range): 9.1 % — AB (ref 0.0–7.0)

## 2022-01-22 LAB — COMPREHENSIVE METABOLIC PANEL
ALT: 25 IU/L (ref 0–32)
AST: 15 IU/L (ref 0–40)
Albumin/Globulin Ratio: 1.4 (ref 1.2–2.2)
Albumin: 4.2 g/dL (ref 3.8–4.9)
Alkaline Phosphatase: 80 IU/L (ref 44–121)
BUN/Creatinine Ratio: 17 (ref 9–23)
BUN: 13 mg/dL (ref 6–24)
Bilirubin Total: 0.4 mg/dL (ref 0.0–1.2)
CO2: 20 mmol/L (ref 20–29)
Calcium: 9.4 mg/dL (ref 8.7–10.2)
Chloride: 104 mmol/L (ref 96–106)
Creatinine, Ser: 0.76 mg/dL (ref 0.57–1.00)
Globulin, Total: 3.1 g/dL (ref 1.5–4.5)
Glucose: 155 mg/dL — ABNORMAL HIGH (ref 70–99)
Potassium: 4.5 mmol/L (ref 3.5–5.2)
Sodium: 139 mmol/L (ref 134–144)
Total Protein: 7.3 g/dL (ref 6.0–8.5)
eGFR: 91 mL/min/{1.73_m2} (ref >59)

## 2022-01-22 LAB — LIPID PANEL
Chol/HDL Ratio: 4 ratio (ref 0.0–4.4)
Cholesterol, Total: 149 mg/dL (ref 100–199)
HDL: 37 mg/dL — ABNORMAL LOW (ref >39)
LDL Chol Calc (NIH): 78 mg/dL (ref 0–99)
Triglycerides: 202 mg/dL — ABNORMAL HIGH (ref 0–149)
VLDL Cholesterol Cal: 34 mg/dL (ref 5–40)

## 2022-01-22 NOTE — Patient Instructions (Signed)

## 2022-01-22 NOTE — Progress Notes (Signed)
01/22/2022, 6:06 PM  Endocrinology follow-up note   Subjective:    Patient ID: Kelly Lang, female    DOB: Feb 03, 1961.  Kelly Lang is being seen in follow-up after she was seen in consultation for management of currently uncontrolled symptomatic diabetes requested by  Ladell Pier, MD.   Past Medical History:  Diagnosis Date   Arthritis    "all over" (06/30/2017)   Chicken pox    Dyslipidemia    Dysrhythmia ablation 2018   a-fib   GERD (gastroesophageal reflux disease)    H/O: hypothyroidism    a. thyroid biopsy 1996 with synthroid. Stopped taking rx and was loss to follow up b. normal thyroid function 10/20/13   High cholesterol    Hx of cardiovascular stress test    ETT/Lexiscan Myoview (12/2013):  No ischemia; not gated; low risk.   Hypertension    Obesity    Persistent atrial fibrillation ([place])    a diagnosed 10/25/2013   Psoriasis    on Skyrizi   Seasonal allergies    Stroke [facility]) 2016   "some speech problems since" (06/30/2017)   Tachycardia induced cardiomyopathy (HCC)    a. 2/2 Afib with RVR for unknown duration (EF: 40-45%)   Thyroid disease    Type II diabetes mellitus ([place])    a. hg A1c 10, newly diagnosed (10/26/13)    Past Surgical History:  Procedure Laterality Date   ATRIAL FIBRILLATION ABLATION  06/30/2017   ATRIAL FIBRILLATION ABLATION N/A 06/30/2017   Procedure: Atrial Fibrillation Ablation;  Surgeon: Thompson Grayer, MD;  Location: [place] [facility];  Service: Cardiovascular;  Laterality: N/A;   BICEPT TENODESIS Right 05/24/2020   Procedure: BICEPS TENODESIS;  Surgeon: Meredith Pel, MD;  Location: [place];  Service: Orthopedics;  Laterality: Right;   BIOPSY THYROID  1996   CARDIOVERSION N/A 12/07/2013   Procedure: CARDIOVERSION;  Surgeon: Casandra Doffing, MD;  Location: [facility] ENDOSCOPY;  Service: Cardiovascular;  Laterality: N/A;    CARDIOVERSION N/A 03/10/2016   Procedure: CARDIOVERSION;  Surgeon: Thayer Headings, MD;  Location: [facility] ENDOSCOPY;  Service: Cardiovascular;  Laterality: N/A;   ESOPHAGOGASTRODUODENOSCOPY N/A 12/07/2017   Procedure: ESOPHAGOGASTRODUODENOSCOPY (EGD);  Surgeon: Irene Shipper, MD;  Location: [facility] ENDOSCOPY;  Service: Endoscopy;  Laterality: N/A;   EYE MUSCLE SURGERY Right ~ 1966   SHOULDER ARTHROSCOPY WITH ROTATOR CUFF REPAIR Right 05/24/2020   Procedure: right shoulder arthroscopy, rotator cuff tear repair biceps tenodesis;  Surgeon: Meredith Pel, MD;  Location: [place];  Service: Orthopedics;  Laterality: Right;   TEE WITHOUT CARDIOVERSION N/A 06/29/2017   Procedure: TRANSESOPHAGEAL ECHOCARDIOGRAM (TEE);  Surgeon: Fay Records, MD;  Location: [facility] ENDOSCOPY;  Service: Cardiovascular;  Laterality: N/A;   TONSILLECTOMY  1971    Social History   Socioeconomic History   Marital status: Divorced    Spouse name: Not on file   Number of children: 0   Years of education: 14   Highest education level: Not on file  Occupational History   Occupation: Chief Technology Officer  Tobacco Use   Smoking status: Former    Packs/day: 1.00  Years: 22.00    Pack years: 22.00    Types: Cigarettes    Quit date: 12/01/2010    Years since quitting: 11.1   Smokeless tobacco: Never  Vaping Use   Vaping Use: Never used  Substance and Sexual Activity   Alcohol use: Yes    Comment: social   Drug use: No   Sexual activity: Not Currently  Other Topics Concern   Not on file  Social History Narrative   Moved to [place] from [place] in 2002.     Fun: shop, sew, decorate   Denies any beliefs effecting healthcare   Social Determinants of Health   Financial Resource Strain: Not on file  Food Insecurity: Not on file  Transportation Needs: Not on file  Physical Activity: Not on file  Stress: Not on file  Social Connections: Not on file    Family History  Adopted: Yes  Problem Relation Age  of Onset   Hypertension Mother    Hyperlipidemia Mother     Outpatient Encounter Medications as of 01/22/2022  Medication Sig   atorvastatin (LIPITOR) 40 MG tablet Take 1 tablet by mouth once daily   diclofenac Sodium (VOLTAREN) 1 % GEL Apply 2 g topically 4 (four) times daily.   DULoxetine (CYMBALTA) 30 MG capsule Take 1 capsule by mouth once daily   glipiZIDE (GLUCOTROL XL) 5 MG 24 hr tablet Take 1 tablet by mouth once daily with breakfast   insulin glargine-yfgn (SEMGLEE) 100 UNIT/ML Pen Inject 80 Units into the skin at bedtime.   lisinopril (ZESTRIL) 10 MG tablet Take 1 tablet by mouth once daily   metFORMIN (GLUCOPHAGE) 1000 MG tablet TAKE 1 TABLET BY MOUTH TWICE DAILY WITH A MEAL   metoCLOPramide (REGLAN) 5 MG tablet Take 1 tablet (5 mg total) by mouth 3 (three) times daily before meals.   metoprolol tartrate (LOPRESSOR) 25 MG tablet Take 1.5 tablets (37.5 mg total) by mouth 2 (two) times daily.   mupirocin ointment (BACTROBAN) 2 % Apply 1 application topically daily.   omeprazole (PRILOSEC) 20 MG capsule Take 1 capsule (20 mg total) by mouth daily.   ONETOUCH DELICA LANCETS 99991111 MISC Use as instructed to check blood sugar up to 3 times daily.   ONETOUCH VERIO test strip USE STRIPS TO CHECK GLUCOSE UP TO THREE TIMES DAILY AS DIRECTED   rivaroxaban (XARELTO) 20 MG TABS tablet Take 1 tablet (20 mg total) by mouth daily with supper.   Semaglutide, 2 MG/DOSE, (OZEMPIC, 2 MG/DOSE,) 8 MG/3ML SOPN INJECT 2 MG UNDER THE SKIN EVERY WEEK   SKYRIZI, 150 MG DOSE, 75 MG/0.83ML PSKT Inject 150 mg as directed See admin instructions. 4x's per year   TRUEPLUS PEN NEEDLES 32G X 4 MM MISC USE AS DIRECTED AT BEDTIME.   [DISCONTINUED] Fluticasone-Salmeterol (ADVAIR DISKUS) 250-50 MCG/DOSE AEPB Inhale 1 puff into the lungs 2 (two) times daily.   No facility-administered encounter medications on file as of 01/22/2022.    ALLERGIES: Allergies  Allergen Reactions   Jardiance [Empagliflozin] Other (See  Comments)    Yeast infections   Codeine Itching and Rash    VACCINATION STATUS: Immunization History  Administered Date(s) Administered   Influenza,inj,Quad PF,6+ Mos 11/19/2016, 09/07/2017, 08/09/2018, 08/16/2020, 08/23/2021   Moderna Sars-Covid-2 Vaccination 01/04/2020, 02/01/2020, 10/12/2020, 06/09/2021   Pneumococcal Polysaccharide-23 06/16/2016   Tdap 06/16/2016    Diabetes She presents for her follow-up diabetic visit. She has type 2 diabetes mellitus. Onset time: She was diagnosed at approximate age of 62 years. Her disease course  has been worsening. There are no hypoglycemic associated symptoms. Pertinent negatives for hypoglycemia include no confusion, headaches, pallor or seizures. Associated symptoms include polydipsia and polyuria. Pertinent negatives for diabetes include no chest pain, no fatigue and no polyphagia. There are no hypoglycemic complications. Symptoms are worsening. Diabetic complications include a CVA. Risk factors for coronary artery disease include dyslipidemia, diabetes mellitus, obesity, hypertension, post-menopausal, sedentary lifestyle and tobacco exposure. Current diabetic treatment includes insulin injections (She is currently on Semglee 80 units nightly, Ozempic 2 mg weekly.  Glipizide 5 mg daily.  Metformin 1000 mg p.o. twice daily.). Her weight is fluctuating minimally. She is following a generally unhealthy diet. When asked about meal planning, she reported none. She has had a previous visit with a dietitian. She participates in exercise intermittently. Blood glucose monitoring compliance is inadequate. Her home blood glucose trend is increasing steadily. Her breakfast blood glucose range is generally 140-180 mg/dl. Her bedtime blood glucose range is generally 180-200 mg/dl. Her overall blood glucose range is 180-200 mg/dl. Kelly Lang presents with worsening glycemic profile averaging 200-225 mg per DL. Her point-of-care A1c is 9.1% worsening from 8.3% during her  last visit.   She does not report or document hypoglycemia.) An ACE inhibitor/angiotensin II receptor blocker is being taken. Eye exam is current.  Hypertension This is a chronic problem. The current episode started more than 1 year ago. The problem is controlled. Pertinent negatives include no chest pain, headaches, palpitations or shortness of breath. Risk factors for coronary artery disease include diabetes mellitus, dyslipidemia, obesity, sedentary lifestyle, smoking/tobacco exposure and post-menopausal state. Past treatments include ACE inhibitors and beta blockers. Hypertensive end-organ damage includes CVA.  Hyperlipidemia This is a chronic problem. The current episode started more than 1 year ago. The problem is uncontrolled. Exacerbating diseases include diabetes and obesity. Pertinent negatives include no chest pain, myalgias or shortness of breath. Risk factors for coronary artery disease include dyslipidemia, diabetes mellitus, hypertension, obesity, a sedentary lifestyle and post-menopausal.    Review of Systems  Constitutional:  Negative for chills, fatigue, fever and unexpected weight change.  HENT:  Negative for trouble swallowing and voice change.   Eyes:  Negative for visual disturbance.  Respiratory:  Negative for cough, shortness of breath and wheezing.   Cardiovascular:  Negative for chest pain, palpitations and leg swelling.  Gastrointestinal:  Negative for diarrhea, nausea and vomiting.  Endocrine: Positive for polydipsia and polyuria. Negative for cold intolerance, heat intolerance and polyphagia.  Musculoskeletal:  Negative for arthralgias and myalgias.  Skin:  Negative for color change, pallor, rash and wound.  Neurological:  Negative for seizures and headaches.  Psychiatric/Behavioral:  Negative for confusion and suicidal ideas.    Objective:    Vitals with BMI 01/22/2022 10/22/2021 10/04/2021  Height 5' 5.5" 5' 5.5" 5\' 5"   Weight 233 lbs 10 oz 237 lbs 3 oz 230 lbs   BMI 38.27 [id number]  Systolic 123XX123 [id number]  Diastolic 72 66 81  Pulse 68 72 68    BP 126/72    Pulse 68    Ht 5' 5.5" (1.664 m)    Wt 233 lb 9.6 oz (106 kg)    LMP 04/15/2015 (LMP Unknown) Comment: last in may   BMI 38.28 kg/m   Wt Readings from Last 3 Encounters:  01/22/22 233 lb 9.6 oz (106 kg)  10/22/21 237 lb 3.2 oz (107.6 kg)  10/04/21 230 lb (104.3 kg)     CMP     Component Value Date/Time   NA 139  01/21/2022 0935   K 4.5 01/21/2022 0935   CL 104 01/21/2022 0935   CO2 20 01/21/2022 0935   GLUCOSE 155 (H) 01/21/2022 0935   GLUCOSE 155 (H) 05/21/2020 0900   BUN 13 01/21/2022 0935   CREATININE 0.76 01/21/2022 0935   CREATININE 0.90 06/16/2016 1128   CALCIUM 9.4 01/21/2022 0935   PROT 7.3 01/21/2022 0935   ALBUMIN 4.2 01/21/2022 0935   AST 15 01/21/2022 0935   ALT 25 01/21/2022 0935   ALKPHOS 80 01/21/2022 0935   BILITOT 0.4 01/21/2022 0935   GFRNONAA 103 12/25/2020 0802   GFRNONAA 73 06/16/2016 1128   GFRAA 118 12/25/2020 0802   GFRAA 84 06/16/2016 1128     Diabetic Labs (most recent): Lab Results  Component Value Date   HGBA1C 9.1 (A) 01/22/2022   HGBA1C 8.5 (A) 09/11/2021   HGBA1C 8.4 (A) 05/01/2021     Lipid Panel ( most recent) Lipid Panel     Component Value Date/Time   CHOL 149 01/21/2022 0935   TRIG 202 (H) 01/21/2022 0935   HDL 37 (L) 01/21/2022 0935   CHOLHDL 4.0 01/21/2022 0935   CHOLHDL 5.2 (H) 11/19/2016 1103   VLDL 59 (H) 11/19/2016 1103   LDLCALC 78 01/21/2022 0935   LDLDIRECT 114.0 02/13/2015 0750   LABVLDL 34 01/21/2022 0935      Lab Results  Component Value Date   TSH 0.597 12/25/2020   TSH 1.01 09/16/2019   TSH 0.718 12/26/2015   TSH 0.950 09/15/2015   TSH 0.95 11/13/2014   TSH 1.020 10/25/2013   FREET4 0.99 12/25/2020   FREET4 0.98 12/26/2015   FREET4 0.98 10/25/2013      Assessment & Plan:   1. DM type 2 causing vascular disease ([place]) - Kelly Lang has currently uncontrolled symptomatic type 2 DM since  61  years of age.  Kelly Lang presents with worsening glycemic profile averaging 200-225 mg per DL. Her point-of-care A1c is 9.1% worsening from 8.3% during her last visit.   She does not report or document hypoglycemia.  - I had a long discussion with her about the progressive nature of diabetes and the pathology behind its complications. -her diabetes is complicated by CVA, peripheral neuropathy and she remains at a high risk for more acute and chronic complications which include CAD, CVA, CKD, retinopathy, and neuropathy. These are all discussed in detail with her.  - I have counseled her on diet  and weight management  by adopting a carbohydrate restricted/protein rich diet. Patient is encouraged to switch to  unprocessed or minimally processed     complex starch and increased protein intake (animal or plant source), fruits, and vegetables. -  she is advised to stick to a routine mealtimes to eat 3 meals  a day and avoid unnecessary snacks ( to snack only to correct hypoglycemia).   - she acknowledges that there is a room for improvement in her food and drink choices. - Suggestion is made for her to avoid simple carbohydrates  from her diet including Cakes, Sweet Desserts, Ice Cream, Soda (diet and regular), Sweet Tea, Candies, Chips, Cookies, Store Bought Juices, Alcohol , Artificial Sweeteners,  Coffee Creamer, and "Sugar-free" Products, Lemonade. This will help patient to have more stable blood glucose profile and potentially avoid unintended weight gain.  The following Lifestyle Medicine recommendations according to [place]  [facility]) were discussed and and offered to patient and she  agrees to start the journey:  A. Whole Foods, Plant-Based Nutrition  comprising of fruits and vegetables, plant-based proteins, whole-grain carbohydrates was discussed in detail with the patient.   A list for source of those nutrients were also provided to the patient.  Patient will use only  water or unsweetened tea for hydration. B.  The need to stay away from risky substances including alcohol, smoking; obtaining 7 to 9 hours of restorative sleep, at least 150 minutes of moderate intensity exercise weekly, the importance of healthy social connections,  and stress management techniques were discussed. C.  A full color page of  Calorie density of various food groups per pound showing examples of each food groups was provided to the patient.   - she reports that she has had enough encounters with dietitian in the past.    - I have approached her with the following individualized plan to manage  her diabetes and patient agrees:   -In light of her presentation with worsening glycemic profile, she will likely need multiple daily injections of insulin in order for her to achieve control of diabetes to target. -However, 1 last effort at lifestyle medicine is recommended to her, see above. -She was emotional, however wants to give it a try.  She is approached to start monitoring blood glucose 4 times until she returns in 4 weeks, in the meantime advised to continue Semglee 80 units nightly, Ozempic 2 mg subcutaneously weekly, glipizide 5 mg XL p.o. daily at breakfast, metformin 1000 mg p.o. daily.  - she is encouraged to call clinic for blood glucose levels less than 70 or above 200 mg /dl. She is also given a brochure for bariatric surgery which is her other option to control diabetes and avoid further escalation medications. - Specific targets for  A1c;  LDL, HDL,  and Triglycerides were discussed with the patient.  2) Blood Pressure /Hypertension:  Her blood pressure is controlled to target.  The above recommended plant dominant whole food approach will address hypertension, hyperlipidemia as well.   she is advised to continue her current medications including lisinopril 10 mg p.o. daily, metoprolol 25 mg p.o. twice daily.   3) Lipids/Hyperlipidemia:   Review of her recent lipid panel  showed improved LDL at 49, overall improving from 114.  She is benefiting from statin.  Advised to continue atorvastatin 40 mg p.o. nightly.  Side effects and precautions discussed with her.     4)  Weight/Diet:  Body mass index is 38.28 kg/m.  -   clearly complicating her diabetes care.   she is  a candidate for weight loss. I discussed with her the fact that loss of 5 - 10% of her  current body weight will have the most impact on her diabetes management.  Exercise, and detailed carbohydrates information provided  -  detailed on discharge instructions. She will benefit from bariatric weight management, which  is briefly discussed with her.  She is given information brochure and contact information for [facility] bariatric team.    5) Chronic Care/Health Maintenance:  -she  is on ACEI/ARB and Statin medications and  is encouraged to initiate and continue to follow up with Ophthalmology, Dentist,  Podiatrist at least yearly or according to recommendations, and advised to   stay away from smoking. I have recommended yearly flu vaccine and pneumonia vaccine at least every 5 years; moderate intensity exercise for up to 150 minutes weekly; and  sleep for at least 7 hours a day.  Her screening ABI was negative for PAD in January 2022, the study will  be repeated in January 2027, or sooner if needed.  - she is  advised to maintain close follow up with Ladell Pier, MD for primary care needs, as well as her other providers for optimal and coordinated care.  I spent 44 minutes in the care of the patient today including review of labs from [facility], Lipids, Thyroid Function, Hematology (current and previous including abstractions from other facilities); face-to-face time discussing  her blood glucose readings/logs, discussing hypoglycemia and hyperglycemia episodes and symptoms, medications doses, her options of short and long term treatment based on the latest standards of care / guidelines;  discussion  about incorporating lifestyle medicine;  and documenting the encounter.    Please refer to Patient Instructions for Blood Glucose Monitoring and Insulin/Medications Dosing Guide"  in media tab for additional information. Please  also refer to " Patient Self Inventory" in the Media  tab for reviewed elements of pertinent patient history.  Kelly Lang participated in the discussions, expressed understanding, and voiced agreement with the above plans.  All questions were answered to her satisfaction. she is encouraged to contact clinic should she have any questions or concerns prior to her return visit.  Follow up plan: - Return in about 4 weeks (around 02/19/2022) for F/U with Meter and Logs Only - no Labs.  Glade Lloyd, MD [facility] [address] [place], [place] [address] Phone: [phone]  Fax: [phone]    01/22/2022, 6:06 PM  This note was partially dictated with voice recognition software. Similar sounding words can be transcribed inadequately or may not  be corrected upon review.

## 2022-01-31 ENCOUNTER — Other Ambulatory Visit: Payer: Self-pay | Admitting: Internal Medicine

## 2022-01-31 DIAGNOSIS — I10 Essential (primary) hypertension: Secondary | ICD-10-CM

## 2022-01-31 DIAGNOSIS — M2550 Pain in unspecified joint: Secondary | ICD-10-CM

## 2022-02-04 ENCOUNTER — Other Ambulatory Visit: Payer: Self-pay

## 2022-02-04 DIAGNOSIS — E1142 Type 2 diabetes mellitus with diabetic polyneuropathy: Secondary | ICD-10-CM

## 2022-02-04 MED ORDER — INSULIN GLARGINE-YFGN 100 UNIT/ML ~~LOC~~ SOPN: 80.0000 [IU] | PEN_INJECTOR | Freq: Every day | SUBCUTANEOUS | 0 refills | Status: DC

## 2022-02-19 ENCOUNTER — Ambulatory Visit: Payer: 59 | Admitting: "Endocrinology

## 2022-02-25 ENCOUNTER — Ambulatory Visit: Payer: 59 | Attending: Nurse Practitioner | Admitting: Nurse Practitioner

## 2022-02-25 ENCOUNTER — Other Ambulatory Visit: Payer: Self-pay

## 2022-02-25 ENCOUNTER — Encounter: Payer: Self-pay | Admitting: Nurse Practitioner

## 2022-02-25 DIAGNOSIS — E1169 Type 2 diabetes mellitus with other specified complication: Secondary | ICD-10-CM

## 2022-02-25 DIAGNOSIS — M2550 Pain in unspecified joint: Secondary | ICD-10-CM | POA: Diagnosis not present

## 2022-02-25 DIAGNOSIS — Z1231 Encounter for screening mammogram for malignant neoplasm of breast: Secondary | ICD-10-CM

## 2022-02-25 DIAGNOSIS — E669 Obesity, unspecified: Secondary | ICD-10-CM

## 2022-02-25 DIAGNOSIS — J40 Bronchitis, not specified as acute or chronic: Secondary | ICD-10-CM | POA: Diagnosis not present

## 2022-02-25 DIAGNOSIS — E785 Hyperlipidemia, unspecified: Secondary | ICD-10-CM

## 2022-02-25 MED ORDER — ATORVASTATIN CALCIUM 40 MG PO TABS: 40.0000 mg | ORAL_TABLET | Freq: Every day | ORAL | 3 refills | Status: DC

## 2022-02-25 MED ORDER — DULOXETINE HCL 30 MG PO CPEP: 30.0000 mg | ORAL_CAPSULE | Freq: Every day | ORAL | 0 refills | Status: DC

## 2022-02-25 MED ORDER — METFORMIN HCL 1000 MG PO TABS: 1000.0000 mg | ORAL_TABLET | Freq: Two times a day (BID) | ORAL | 0 refills | Status: DC

## 2022-02-25 MED ORDER — AZITHROMYCIN 250 MG PO TABS: ORAL_TABLET | ORAL | 0 refills | Status: AC

## 2022-02-25 NOTE — Patient Instructions (Signed)
[facility] [address] , [place] [address] Phone [phone] Fax [phone] 

## 2022-02-25 NOTE — Progress Notes (Signed)
Virtual Visit Note ?Due to national recommendations of social distancing due to [place] 19, virtual visit is felt to be most appropriate for this patient at this time.  I discussed the limitations, risks, security and privacy concerns of performing an evaluation and management service by video and the availability of in person appointments. I also discussed with the patient that there may be a patient responsible charge related to this service. The patient expressed understanding and agreed to proceed.  ? ? ?I connected with [name] on 02/25/22  at   9:10 AM EDT  EDT by VIDEO and verified that I am speaking with the correct person using two identifiers. ? ? ?Location of Patient: ?Private Residence ?  ?Location of Provider: ?Scientist, research (physical sciences) and [employer] Office  ?  ?Persons participating in VIRTUAL visit: ?Geryl Rankins FNP-BC ?[name]  ?  ?History of Present Illness: ?VIRTUAL visit for: Medication refills and Bronchitis symptoms ?She has a past medical history of Arthritis, Dyslipidemia, Dysrhythmia (ablation 2018), GERD, H/O: hypothyroidism, High cholesterol, Hypertension, Obesity, Persistent atrial fibrillation, Psoriasis, Seasonal allergies, Stroke (2016), Tachycardia induced cardiomyopathy, Type II diabetes mellitus.  ? ?Current symptoms include headache frontal, nasal congestion, productive cough with sputum described as yellow and tenacious, and sore throat. Symptoms began 8 days ago and are unchanged since that time.  Past history is significant for  history of bronchitis in the past/former smoker . ? ?She is followed by endocrinology  Dr. Dorris Fetch for her diabetes which is not well controlled.  ?Lab Results  ?Component Value Date  ? HGBA1C 9.1 (A) 01/22/2022  ?  ?Lab Results  ?Component Value Date  ? [place] 80 01/21/2022  ?  ? ?Medication refills requested today. Metformin, atorvastatin, duloxetine.   ? ? ? ?Past Medical History:  ?Diagnosis Date  ? Arthritis   ? "all over" (06/30/2017)  ?  Chicken pox   ? Dyslipidemia   ? Dysrhythmia ablation 2018  ? a-fib  ? GERD (gastroesophageal reflux disease)   ? H/O: hypothyroidism   ? a. thyroid biopsy 1996 with synthroid. Stopped taking rx and was loss to follow up b. normal thyroid function 10/20/13  ? High cholesterol   ? Hx of cardiovascular stress test   ? ETT/Lexiscan Myoview (12/2013):  No ischemia; not gated; low risk.  ? Hypertension   ? Obesity   ? Persistent atrial fibrillation ([place])   ? a diagnosed 10/25/2013  ? Psoriasis   ? on Skyrizi  ? Seasonal allergies   ? Stroke [facility]) 2016  ? "some speech problems since" (06/30/2017)  ? Tachycardia induced cardiomyopathy ([place])   ? a. 2/2 Afib with RVR for unknown duration (EF: 40-45%)  ? Thyroid disease   ? Type II diabetes mellitus ([place])   ? a. hg A1c 10, newly diagnosed (10/26/13)  ?  ?Past Surgical History:  ?Procedure Laterality Date  ? ATRIAL FIBRILLATION ABLATION  06/30/2017  ? ATRIAL FIBRILLATION ABLATION N/A 06/30/2017  ? Procedure: Atrial Fibrillation Ablation;  Surgeon: Thompson Grayer, MD;  Location: [place] [facility];  Service: Cardiovascular;  Laterality: N/A;  ? BICEPT TENODESIS Right 05/24/2020  ? Procedure: BICEPS TENODESIS;  Surgeon: Meredith Pel, MD;  Location: [place];  Service: Orthopedics;  Laterality: Right;  ? BIOPSY THYROID  1996  ? CARDIOVERSION N/A 12/07/2013  ? Procedure: CARDIOVERSION;  Surgeon: Casandra Doffing, MD;  Location: [facility] ENDOSCOPY;  Service: Cardiovascular;  Laterality: N/A;  ? CARDIOVERSION N/A 03/10/2016  ? Procedure: CARDIOVERSION;  Surgeon: Thayer Headings, MD;  Location: [facility]OSCOPY;  Service: Cardiovascular;  Laterality: N/A;  ? ESOPHAGOGASTRODUODENOSCOPY N/A 12/07/2017  ? Procedure: ESOPHAGOGASTRODUODENOSCOPY (EGD);  Surgeon: Irene Shipper, MD;  Location: [facility] ENDOSCOPY;  Service: Endoscopy;  Laterality: N/A;  ? EYE MUSCLE SURGERY Right ~ 1966  ? SHOULDER ARTHROSCOPY WITH ROTATOR CUFF REPAIR Right 05/24/2020  ? Procedure: right shoulder arthroscopy,  rotator cuff tear repair biceps tenodesis;  Surgeon: Meredith Pel, MD;  Location: [place];  Service: Orthopedics;  Laterality: Right;  ? TEE WITHOUT CARDIOVERSION N/A 06/29/2017  ? Procedure: TRANSESOPHAGEAL ECHOCARDIOGRAM (TEE);  Surgeon: Fay Records, MD;  Location: [facility] ENDOSCOPY;  Service: Cardiovascular;  Laterality: N/A;  ? TONSILLECTOMY  1971  ?  ?Family History  ?Adopted: Yes  ?Problem Relation Age of Onset  ? Hypertension Mother   ? Hyperlipidemia Mother   ?  ?Social History  ? ?Socioeconomic History  ? Marital status: Divorced  ?  Spouse name: Not on file  ? Number of children: 0  ? Years of education: 69  ? Highest education level: Not on file  ?Occupational History  ? Occupation: Chief Technology Officer  ?Tobacco Use  ? Smoking status: Former  ?  Packs/day: 1.00  ?  Years: 22.00  ?  Pack years: 22.00  ?  Types: Cigarettes  ?  Quit date: 12/01/2010  ?  Years since quitting: 11.2  ? Smokeless tobacco: Never  ?Vaping Use  ? Vaping Use: Never used  ?Substance and Sexual Activity  ? Alcohol use: Yes  ?  Comment: social  ? Drug use: No  ? Sexual activity: Not Currently  ?Other Topics Concern  ? Not on file  ?Social History Narrative  ? Moved to [place] from [place] in 2002.    ? Fun: shop, sew, decorate  ? Denies any beliefs effecting healthcare  ? ?Social Determinants of Health  ? ?Financial Resource Strain: Not on file  ?Food Insecurity: Not on file  ?Transportation Needs: Not on file  ?Physical Activity: Not on file  ?Stress: Not on file  ?Social Connections: Not on file  ?  ? ?Observations/Objective: ?Awake, alert and oriented x 3 ? ? ?Review of Systems  ?Constitutional:  Positive for malaise/fatigue. Negative for chills, fever and weight loss.  ?HENT:  Positive for congestion and sore throat. Negative for ear discharge, ear pain, hearing loss, nosebleeds and sinus pain.   ?Eyes: Negative.  Negative for blurred vision, double vision and photophobia.  ?Respiratory:  Positive for cough and  sputum production. Negative for shortness of breath and wheezing.   ?Cardiovascular: Negative.  Negative for chest pain, palpitations, orthopnea and leg swelling.  ?Gastrointestinal: Negative.  Negative for abdominal pain, diarrhea, heartburn, nausea and vomiting.  ?Musculoskeletal: Negative.  Negative for myalgias.  ?Neurological:  Positive for headaches. Negative for dizziness, focal weakness and seizures.  ?Endo/Heme/Allergies:  Negative for environmental allergies.  ?Psychiatric/Behavioral: Negative.  Negative for suicidal ideas.    ?Assessment and Plan: ?Diagnoses and all orders for this visit: ? ?Bronchitis ?-     azithromycin (ZITHROMAX) 250 MG tablet; Take 2 tablets on day 1, then 1 tablet daily on days 2 through 5 ?INSTRUCTIONS: use a humidifier for nasal congestion ?Drink plenty of fluids, rest and wash hands frequently to avoid the spread of infection ?Alternate tylenol and Motrin for relief of fever  ? ? ?Breast cancer screening by mammogram ?-     MS DIGITAL SCREENING TOMO BILATERAL; Future ? ?Polyarthralgia ?-     DULoxetine (CYMBALTA) 30 MG capsule; Take 1  capsule (30 mg total) by mouth daily. ? ?Hyperlipidemia, unspecified hyperlipidemia type ?-     atorvastatin (LIPITOR) 40 MG tablet; Take 1 tablet (40 mg total) by mouth daily. ? ?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(HCC) ?-     metFORMIN (GLUCOPHAGE) 1000 MG tablet; Take 1 tablet (1,000 mg total) by mouth 2 (two) times daily with a meal. ? ?  ? ?Follow Up Instructions ?Return if symptoms worsen or fail to improve.  ? ?  ?I discussed the assessment and treatment plan with the patient. The patient was provided an opportunity to ask questions and all were answered. The patient agreed with the plan and demonstrated an understanding of the instructions. ?  ?The patient was advised to call back or seek an in-person evaluation if the symptoms worsen or if the condition fails to improve as anticipated. ? ?I provided 12 minutes of face-to-face time during  this encounter including median intraservice time, reviewing previous notes, labs, imaging, medications and explaining diagnosis and management. ? ?Gildardo Pounds, FNP-BC  ?

## 2022-03-05 ENCOUNTER — Ambulatory Visit: Payer: Self-pay | Admitting: Physician Assistant

## 2022-03-12 ENCOUNTER — Encounter: Payer: Self-pay | Admitting: "Endocrinology

## 2022-03-12 ENCOUNTER — Ambulatory Visit (INDEPENDENT_AMBULATORY_CARE_PROVIDER_SITE_OTHER): Payer: 59 | Admitting: "Endocrinology

## 2022-03-12 VITALS — BP 114/68 | HR 84 | Ht 65.5 in | Wt 231.2 lb

## 2022-03-12 DIAGNOSIS — E1159 Type 2 diabetes mellitus with other circulatory complications: Secondary | ICD-10-CM

## 2022-03-12 DIAGNOSIS — E782 Mixed hyperlipidemia: Secondary | ICD-10-CM

## 2022-03-12 DIAGNOSIS — I10 Essential (primary) hypertension: Secondary | ICD-10-CM | POA: Diagnosis not present

## 2022-03-12 DIAGNOSIS — Z794 Long term (current) use of insulin: Secondary | ICD-10-CM

## 2022-03-12 DIAGNOSIS — E559 Vitamin D deficiency, unspecified: Secondary | ICD-10-CM

## 2022-03-12 DIAGNOSIS — E1142 Type 2 diabetes mellitus with diabetic polyneuropathy: Secondary | ICD-10-CM

## 2022-03-12 MED ORDER — GLIPIZIDE ER 5 MG PO TB24: 5.0000 mg | ORAL_TABLET | Freq: Every day | ORAL | 1 refills | Status: DC

## 2022-03-12 MED ORDER — DEXCOM G7 SENSOR MISC: 2 refills | Status: DC

## 2022-03-12 MED ORDER — DEXCOM G7 RECEIVER DEVI: 0 refills | Status: AC

## 2022-03-12 NOTE — Patient Instructions (Signed)

## 2022-03-12 NOTE — Progress Notes (Signed)
? ?                                                          ?     03/12/2022, 12:28 PM ? ?Endocrinology follow-up note ? ? ?Subjective:  ? ? Patient ID: Kelly Lang, female    DOB: 02-12-1961.  ?Kelly Lang is being seen in follow-up after she was seen in consultation for management of currently uncontrolled symptomatic diabetes requested by  Marcine Matar, MD. ? ? ?Past Medical History:  ?Diagnosis Date  ? Arthritis   ? "all over" (06/30/2017)  ? Chicken pox   ? Dyslipidemia   ? Dysrhythmia ablation 2018  ? a-fib  ? GERD (gastroesophageal reflux disease)   ? H/O: hypothyroidism   ? a. thyroid biopsy 1996 with synthroid. Stopped taking rx and was loss to follow up b. normal thyroid function 10/20/13  ? High cholesterol   ? Hx of cardiovascular stress test   ? ETT/Lexiscan Myoview (12/2013):  No ischemia; not gated; low risk.  ? Hypertension   ? Obesity   ? Persistent atrial fibrillation (HCC)   ? a diagnosed 10/25/2013  ? Psoriasis   ? on Skyrizi  ? Seasonal allergies   ? Stroke [facility]) 2016  ? "some speech problems since" (06/30/2017)  ? Tachycardia induced cardiomyopathy (HCC)   ? a. 2/2 Afib with RVR for unknown duration (EF: 40-45%)  ? Thyroid disease   ? Type II diabetes mellitus (HCC)   ? a. hg A1c 10, newly diagnosed (10/26/13)  ? ? ?Past Surgical History:  ?Procedure Laterality Date  ? ATRIAL FIBRILLATION ABLATION  06/30/2017  ? ATRIAL FIBRILLATION ABLATION N/A 06/30/2017  ? Procedure: Atrial Fibrillation Ablation;  Surgeon: Hillis Range, MD;  [facility];  Service: Cardiovascular;  Laterality: N/A;  ? BICEPT TENODESIS Right 05/24/2020  ? Procedure: BICEPS TENODESIS;  Surgeon: Cammy Copa, MD;  Location: [facility];  Service: Orthopedics;  Laterality: Right;  ? BIOPSY THYROID  1996  ? CARDIOVERSION N/A 12/07/2013  ? Procedure: CARDIOVERSION;  Surgeon: Everette Rank, MD;  Location: [facility] ENDOSCOPY;  Service: Cardiovascular;  Laterality: N/A;  ?  CARDIOVERSION N/A 03/10/2016  ? Procedure: CARDIOVERSION;  Surgeon: Vesta Mixer, MD;  Location: [facility] ENDOSCOPY;  Service: Cardiovascular;  Laterality: N/A;  ? ESOPHAGOGASTRODUODENOSCOPY N/A 12/07/2017  ? Procedure: ESOPHAGOGASTRODUODENOSCOPY (EGD);  Surgeon: Hilarie Fredrickson, MD;  Location: [facility] ENDOSCOPY;  Service: Endoscopy;  Laterality: N/A;  ? EYE MUSCLE SURGERY Right ~ 1966  ? SHOULDER ARTHROSCOPY WITH ROTATOR CUFF REPAIR Right 05/24/2020  ? Procedure: right shoulder arthroscopy, rotator cuff tear repair biceps tenodesis;  Surgeon: Cammy Copa, MD;  Location: [facility];  Service: Orthopedics;  Laterality: Right;  ? TEE WITHOUT CARDIOVERSION N/A 06/29/2017  ? Procedure: TRANSESOPHAGEAL ECHOCARDIOGRAM (TEE);  Surgeon: Pricilla Riffle, MD;  Location: [facility] ENDOSCOPY;  Service: Cardiovascular;  Laterality: N/A;  ? TONSILLECTOMY  1971  ? ? ?Social History  ? ?Socioeconomic History  ? Marital status: Divorced  ?  Spouse name: Not on file  ? Number of children: 0  ? Years of education: 55  ? Highest education level: Not on file  ?Occupational History  ? Occupation: Designer, television/film set  ?Tobacco Use  ? Smoking status: Former  ?  Packs/day: 1.00  ?  Years: 22.00  ?  Pack years: 22.00  ?  Types: Cigarettes  ?  Quit date: 12/01/2010  ?  Years since quitting: 11.2  ? Smokeless tobacco: Never  ?Vaping Use  ? Vaping Use: Never used  ?Substance and Sexual Activity  ? Alcohol use: Yes  ?  Comment: social  ? Drug use: No  ? Sexual activity: Not Currently  ?Other Topics Concern  ? Not on file  ?Social History Narrative  ? Moved to [place] from [place] in 2002.    ? Fun: shop, sew, decorate  ? Denies any beliefs effecting healthcare  ? ?Social Determinants of Health  ? ?Financial Resource Strain: Not on file  ?Food Insecurity: Not on file  ?Transportation Needs: Not on file  ?Physical Activity: Not on file  ?Stress: Not on file  ?Social Connections: Not on file  ? ? ?Family History  ?Adopted: Yes  ?Problem Relation Age  of Onset  ? Hypertension Mother   ? Hyperlipidemia Mother   ? ? ?Outpatient Encounter Medications as of 03/12/2022  ?Medication Sig  ? Continuous Blood Gluc Receiver (DEXCOM G7 RECEIVER) DEVI Use to monitor BG continuously  ? Continuous Blood Gluc Sensor (DEXCOM G7 SENSOR) MISC Change sensor every 10 days  ? atorvastatin (LIPITOR) 40 MG tablet Take 1 tablet (40 mg total) by mouth daily.  ? diclofenac Sodium (VOLTAREN) 1 % GEL Apply 2 g topically 4 (four) times daily.  ? DULoxetine (CYMBALTA) 30 MG capsule Take 1 capsule (30 mg total) by mouth daily.  ? glipiZIDE (GLUCOTROL XL) 5 MG 24 hr tablet Take 1 tablet (5 mg total) by mouth daily with breakfast.  ? insulin glargine-yfgn (SEMGLEE) 100 UNIT/ML Pen Inject 80 Units into the skin at bedtime.  ? lisinopril (ZESTRIL) 10 MG tablet Take 1 tablet by mouth once daily  ? metFORMIN (GLUCOPHAGE) 1000 MG tablet Take 1 tablet (1,000 mg total) by mouth 2 (two) times daily with a meal.  ? metoCLOPramide (REGLAN) 5 MG tablet Take 1 tablet (5 mg total) by mouth 3 (three) times daily before meals.  ? metoprolol tartrate (LOPRESSOR) 25 MG tablet Take 1.5 tablets (37.5 mg total) by mouth 2 (two) times daily.  ? mupirocin ointment (BACTROBAN) 2 % Apply 1 application topically daily.  ? omeprazole (PRILOSEC) 20 MG capsule Take 1 capsule (20 mg total) by mouth daily.  ? ONETOUCH DELICA LANCETS 33G MISC Use as instructed to check blood sugar up to 3 times daily.  ? ONETOUCH VERIO test strip USE STRIPS TO CHECK GLUCOSE UP TO THREE TIMES DAILY AS DIRECTED  ? rivaroxaban (XARELTO) 20 MG TABS tablet Take 1 tablet (20 mg total) by mouth daily with supper.  ? Semaglutide, 2 MG/DOSE, (OZEMPIC, 2 MG/DOSE,) 8 MG/3ML SOPN INJECT 2 MG UNDER THE SKIN EVERY WEEK  ? SKYRIZI, 150 MG DOSE, 75 MG/0.83ML PSKT Inject 150 mg as directed See admin instructions. 4x's per year  ? TRUEPLUS PEN NEEDLES 32G X 4 MM MISC USE AS DIRECTED AT BEDTIME.  ? [DISCONTINUED] Fluticasone-Salmeterol (ADVAIR DISKUS) 250-50  MCG/DOSE AEPB Inhale 1 puff into the lungs 2 (two) times daily.  ? [DISCONTINUED] glipiZIDE (GLUCOTROL XL) 5 MG 24 hr tablet Take 1 tablet by mouth once daily with breakfast  ? ?No facility-administered encounter medications on file as of 03/12/2022.  ? ? ?ALLERGIES: ?Allergies  ?Allergen Reactions  ? Jardiance [Empagliflozin] Other (See Comments)  ?  Yeast infections  ? Codeine Itching and Rash  ? ? ?VACCINATION STATUS: ?Immunization History  ?Administered  Date(s) Administered  ? Influenza,inj,Quad PF,6+ Mos 11/19/2016, 09/07/2017, 08/09/2018, 08/16/2020, 08/23/2021  ? Moderna Sars-Covid-2 Vaccination 01/04/2020, 02/01/2020, 10/12/2020, 06/09/2021  ? Pneumococcal Polysaccharide-23 06/16/2016  ? Tdap 06/16/2016  ? ? ?Diabetes ?She presents for her follow-up diabetic visit. She has type 2 diabetes mellitus. Onset time: She was diagnosed at approximate age of 50 years. Her disease course has been improving. There are no hypoglycemic associated symptoms. Pertinent negatives for hypoglycemia include no confusion, headaches, pallor or seizures. Pertinent negatives for diabetes include no chest pain, no fatigue, no polydipsia, no polyphagia and no polyuria. There are no hypoglycemic complications. Symptoms are improving. Diabetic complications include a CVA. Risk factors for coronary artery disease include dyslipidemia, diabetes mellitus, obesity, hypertension, post-menopausal, sedentary lifestyle and tobacco exposure. Current diabetic treatment includes insulin injections (She is currently on Semglee 80 units nightly, Ozempic 2 mg weekly.  Glipizide 5 mg daily.  Metformin 1000 mg p.o. twice daily.). Her weight is fluctuating minimally. She is following a generally unhealthy diet. When asked about meal planning, she reported none. She has had a previous visit with a dietitian. She participates in exercise intermittently. Blood glucose monitoring compliance is inadequate. Her home blood glucose trend is increasing steadily.  Her breakfast blood glucose range is generally 130-140 mg/dl. Her lunch blood glucose range is generally 140-180 mg/dl. Her dinner blood glucose range is generally 140-180 mg/dl. Her bedtime blood glucose rang

## 2022-03-18 ENCOUNTER — Ambulatory Visit: Payer: 59 | Admitting: Cardiovascular Disease

## 2022-03-24 ENCOUNTER — Other Ambulatory Visit: Payer: Self-pay | Admitting: Internal Medicine

## 2022-03-24 ENCOUNTER — Other Ambulatory Visit: Payer: Self-pay | Admitting: "Endocrinology

## 2022-03-24 DIAGNOSIS — I10 Essential (primary) hypertension: Secondary | ICD-10-CM

## 2022-03-24 DIAGNOSIS — Z794 Long term (current) use of insulin: Secondary | ICD-10-CM

## 2022-03-25 NOTE — Telephone Encounter (Signed)
Patient called and advised per last refill it was noted OV for additional refills. She says she had a virtual appointment last month with Zelda that she was told would take the place for a visit with Dr. Laural Benes, since she couldn't get in with her. I advised per OV notes, nothing mentioned about HTN or lisinopril, so she will need to be seen for f/u. She says that's fine to schedule with Dr. Laural Benes for late afternoon on M-W-F only. First available scheduled 06/30/22. Advised refills will be sent in. ?

## 2022-03-25 NOTE — Telephone Encounter (Signed)
Requested Prescriptions  ?Pending Prescriptions Disp Refills  ?? lisinopril (ZESTRIL) 10 MG tablet [Pharmacy Med Name: Lisinopril 10 MG Oral Tablet] 90 tablet 0  ?  Sig: TAKE 1 TABLET BY MOUTH ONCE DAILY . APPOINTMENT REQUIRED FOR FUTURE REFILLS  ?  ? Cardiovascular:  ACE Inhibitors Passed - 03/24/2022  7:41 AM  ?  ?  Passed - Cr in normal range and within 180 days  ?  Creat  ?Date Value Ref Range Status  ?06/16/2016 0.90 0.50 - 1.05 mg/dL Final  ?  Comment:  ?    ?For patients > or = 59 years of age: The upper reference limit for ?Creatinine is approximately 13% higher for people identified as ?African-American. ?  ?  ? ?Creatinine, Ser  ?Date Value Ref Range Status  ?01/21/2022 0.76 0.57 - 1.00 mg/dL Final  ? ?Creatinine, POC  ?Date Value Ref Range Status  ?09/24/2020 300 mg/dL Final  ?   ?  ?  Passed - K in normal range and within 180 days  ?  Potassium  ?Date Value Ref Range Status  ?01/21/2022 4.5 3.5 - 5.2 mmol/L Final  ?   ?  ?  Passed - Patient is not pregnant  ?  ?  Passed - Last BP in normal range  ?  BP Readings from Last 1 Encounters:  ?03/12/22 114/68  ?   ?  ?  Passed - Valid encounter within last 6 months  ?  Recent Outpatient Visits   ?      ? 4 weeks ago Bronchitis  ? [facility] [place], [place] W, NP  ? 7 months ago 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(HCC)  ? [facility] Marcine Matar, MD  ? 9 months ago 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(HCC)  ? [facility] Jonah Blue B, MD  ? 1 year ago Chronic midline low back pain without sciatica  ? [facility] Marcine Matar, MD  ? 1 year ago Essential hypertension  ? [facility] Marcine Matar, MD  ?  ?  ?Future Appointments   ?        ? In 6 days Nahser, [name], MD [facility], [facility]  ? In 1 week Sheilah Pigeon, PA-C [facility] [employer],  [facility]  ? In 3 months Laural Benes, Binnie Rail, MD [facility]  ?  ? ?  ?  ?  ? ?

## 2022-03-27 NOTE — Progress Notes (Signed)
? ?Cardiology Office Note ?Date:  03/27/2022  ?Patient ID:  Kelly Lang, DOB 1961-04-08, MRN [id number] ?PCP:  Ladell Pier, MD  ?Cardiologist:  Dr. Acie Fredrickson ?Electrophysiologist: Dr. Rayann Heman ? ?Chief Complaint:  annual visit (over due) ? ?History of Present Illness: ?Kelly Lang is a 61 y.o. female with history of HTN, CVA, HLD, Afib, DM. ? ?She comes in today to be seen for Dr. Rayann Heman, last seen by him bia tele health visit April 2020.  ?She saw A. Tillery, PA-C for EP on 04/05/20. She was doing well, no symptoms of her AFib, no changes were made. ? ?She has followed with Dr. Acie Fredrickson, most recently just a couple days ago, doing well, no changes were made ? ?TODAY ?No symptoms of her Afib. ?No cardiac concerns, working still. ?No bleeding or signs of bleeding ?Just saw Dr. Acie Fredrickson. ? ? ? ?AFib/AAD hx ?Diagnosed 2014 ?Flecainide 2015 stopped 2018 with failure to maintain SR ?PVI ablation 06/30/2017 ? ? ? ?Past Medical History:  ?Diagnosis Date  ? Arthritis   ? "all over" (06/30/2017)  ? Chicken pox   ? Dyslipidemia   ? Dysrhythmia ablation 2018  ? a-fib  ? GERD (gastroesophageal reflux disease)   ? H/O: hypothyroidism   ? a. thyroid biopsy 1996 with synthroid. Stopped taking rx and was loss to follow up b. normal thyroid function 10/20/13  ? High cholesterol   ? Hx of cardiovascular stress test   ? ETT/Lexiscan Myoview (12/2013):  No ischemia; not gated; low risk.  ? Hypertension   ? Obesity   ? Persistent atrial fibrillation ([place])   ? a diagnosed 10/25/2013  ? Psoriasis   ? on Skyrizi  ? Seasonal allergies   ? Stroke [facility]) 2016  ? "some speech problems since" (06/30/2017)  ? 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 a. 2/2 Afib with RVR for unknown duration (EF: 40-45%)  ? Thyroid disease   ? Type II diabetes mellitus ([place])   ? a. hg A1c 10, newly diagnosed (10/26/13)  ? ? ?Past Surgical History:  ?Procedure Laterality Date  ? ATRIAL FIBRILLATION ABLATION  06/30/2017  ? ATRIAL FIBRILLATION ABLATION N/A 06/30/2017   ? Procedure: Atrial Fibrillation Ablation;  Surgeon: Thompson Grayer, MD;  Location: [place] [facility];  Service: Cardiovascular;  Laterality: N/A;  ? BICEPT TENODESIS Right 05/24/2020  ? Procedure: BICEPS TENODESIS;  Surgeon: Meredith Pel, MD;  Location: [place];  Service: Orthopedics;  Laterality: Right;  ? BIOPSY THYROID  1996  ? CARDIOVERSION N/A 12/07/2013  ? Procedure: CARDIOVERSION;  Surgeon: Casandra Doffing, MD;  Location: [facility] ENDOSCOPY;  Service: Cardiovascular;  Laterality: N/A;  ? CARDIOVERSION N/A 03/10/2016  ? Procedure: CARDIOVERSION;  Surgeon: Thayer Headings, MD;  Location: [facility] ENDOSCOPY;  Service: Cardiovascular;  Laterality: N/A;  ? ESOPHAGOGASTRODUODENOSCOPY N/A 12/07/2017  ? Procedure: ESOPHAGOGASTRODUODENOSCOPY (EGD);  Surgeon: Irene Shipper, MD;  Location: [facility] ENDOSCOPY;  Service: Endoscopy;  Laterality: N/A;  ? EYE MUSCLE SURGERY Right ~ 1966  ? SHOULDER ARTHROSCOPY WITH ROTATOR CUFF REPAIR Right 05/24/2020  ? Procedure: right shoulder arthroscopy, rotator cuff tear repair biceps tenodesis;  Surgeon: Meredith Pel, MD;  Location: [place];  Service: Orthopedics;  Laterality: Right;  ? TEE WITHOUT CARDIOVERSION N/A 06/29/2017  ? Procedure: TRANSESOPHAGEAL ECHOCARDIOGRAM (TEE);  Surgeon: Fay Records, MD;  Location: [facility] ENDOSCOPY;  Service: Cardiovascular;  Laterality: N/A;  ? TONSILLECTOMY  1971  ? ? ?Current Outpatient Medications  ?Medication Sig Dispense Refill  ? lisinopril (ZESTRIL) 10  MG tablet TAKE 1 TABLET BY MOUTH ONCE DAILY . APPOINTMENT REQUIRED FOR FUTURE REFILLS 90 tablet 0  ? atorvastatin (LIPITOR) 40 MG tablet Take 1 tablet (40 mg total) by mouth daily. 90 tablet 3  ? Continuous Blood Gluc Receiver (DEXCOM G7 RECEIVER) DEVI Use to monitor BG continuously 1 each 0  ? Continuous Blood Gluc Sensor (DEXCOM G7 SENSOR) MISC Change sensor every 10 days 3 each 2  ? diclofenac Sodium (VOLTAREN) 1 % GEL Apply 2 g topically 4 (four) times daily. 100 g 2  ?  DULoxetine (CYMBALTA) 30 MG capsule Take 1 capsule (30 mg total) by mouth daily. 90 capsule 0  ? glipiZIDE (GLUCOTROL XL) 5 MG 24 hr tablet Take 1 tablet (5 mg total) by mouth daily with breakfast. 90 tablet 1  ? metFORMIN (GLUCOPHAGE) 1000 MG tablet Take 1 tablet (1,000 mg total) by mouth 2 (two) times daily with a meal. 180 tablet 0  ? metoCLOPramide (REGLAN) 5 MG tablet Take 1 tablet (5 mg total) by mouth 3 (three) times daily before meals. 90 tablet 11  ? metoprolol tartrate (LOPRESSOR) 25 MG tablet Take 1.5 tablets (37.5 mg total) by mouth 2 (two) times daily. 270 tablet 3  ? mupirocin ointment (BACTROBAN) 2 % Apply 1 application topically daily. 30 g 2  ? omeprazole (PRILOSEC) 20 MG capsule Take 1 capsule (20 mg total) by mouth daily. 30 capsule 11  ? ONETOUCH DELICA LANCETS 99991111 MISC Use as instructed to check blood sugar up to 3 times daily. 100 each 11  ? ONETOUCH VERIO test strip USE STRIPS TO CHECK GLUCOSE UP TO THREE TIMES DAILY AS DIRECTED 100 each 6  ? rivaroxaban (XARELTO) 20 MG TABS tablet Take 1 tablet (20 mg total) by mouth daily with supper. 30 tablet 11  ? Semaglutide, 2 MG/DOSE, (OZEMPIC, 2 MG/DOSE,) 8 MG/3ML SOPN INJECT 2 MG UNDER THE SKIN EVERY WEEK 9 mL 1  ? SEMGLEE, YFGN, 100 UNIT/ML Pen INJECT 80 UNITS SUBCUTANEOUSLY AT BEDTIME 30 mL 0  ? SKYRIZI, 150 MG DOSE, 75 MG/0.83ML PSKT Inject 150 mg as directed See admin instructions. 4x's per year    ? TRUEPLUS PEN NEEDLES 32G X 4 MM MISC USE AS DIRECTED AT BEDTIME. 100 each 2  ? ?No current facility-administered medications for this visit.  ? ? ?Allergies:   Jardiance [empagliflozin] and Codeine  ? ?Social History:  The patient  reports that she quit smoking about 11 years ago. Her smoking use included cigarettes. She has a 22.00 pack-year smoking history. She has never used smokeless tobacco. She reports current alcohol use. She reports that she does not use drugs.  ? ?Family History:  The patient's family history includes Hyperlipidemia in her  mother; Hypertension in her mother. She was adopted. ? ?ROS:  Please see the history of present illness.    ?All other systems are reviewed and otherwise negative.  ? ?PHYSICAL EXAM:  ?VS:  LMP 04/15/2015 (LMP Unknown) Comment: last in may BMI: There is no height or weight on file to calculate BMI. ?Well nourished, well developed, in no acute distress ?HEENT: normocephalic, atraumatic ?Neck: no JVD, carotid bruits or masses ?Cardiac:  RRR; no significant murmurs, no rubs, or gallops ?Lungs:  CTA b/l, no wheezing, rhonchi or rales ?Abd: soft, nontender ?MS: no deformity or atrophy ?Ext:  no edema ?Skin: warm and dry, no rash ?Neuro:  No gross deficits appreciated ?Psych: euthymic mood, full affect ? ? ? ?EKG:  not done today ? ? ?12/28/2017: TTE ?  Study Conclusions  ?- Left ventricle: The cavity size was normal. There was mild  ?  concentric hypertrophy. Systolic function was normal. The  ?  estimated ejection fraction was in the range of 60% to 65%. Wall  ?  motion was normal; there were no regional wall motion  ?  abnormalities. Left ventricular diastolic function parameters  ?  were normal.  ?- Aortic valve: There was no regurgitation.  ?- Right ventricle: The cavity size was normal. Wall thickness was  ?  normal. Systolic function was normal.  ?- Right atrium: The atrium was normal in size.  ?- Tricuspid valve: There was mild regurgitation.  ?- Pulmonic valve: There was no regurgitation.  ?- Pulmonary arteries: Systolic pressure was within the normal  ?  range.  ?- Inferior vena cava: The vessel was normal in size.  ?- Pericardium, extracardiac: There was no pericardial effusion.  ? ? ?06/30/2017: EPS/ablation ?CONCLUSIONS: ?1. Atrial fibrillation upon presentation.   ?2. Intracardiac echo reveals a moderate sized left atrium with four separate pulmonary veins without evidence of pulmonary vein stenosis. ?3. Successful electrical isolation and anatomical encircling of all four pulmonary veins with radiofrequency  current.  A WACA approach was used ?3. Additional left atrial ablation was performed with a standard box lesion created along the posterior wall of the left atrium ?4. Atrial fibrillation successfully ca

## 2022-03-31 ENCOUNTER — Ambulatory Visit (INDEPENDENT_AMBULATORY_CARE_PROVIDER_SITE_OTHER): Payer: 59 | Admitting: Cardiovascular Disease

## 2022-03-31 ENCOUNTER — Encounter: Payer: Self-pay | Admitting: Cardiovascular Disease

## 2022-03-31 VITALS — BP 112/64 | HR 76 | Ht 65.5 in | Wt 231.4 lb

## 2022-03-31 DIAGNOSIS — E782 Mixed hyperlipidemia: Secondary | ICD-10-CM

## 2022-03-31 DIAGNOSIS — I4891 Unspecified atrial fibrillation: Secondary | ICD-10-CM | POA: Diagnosis not present

## 2022-03-31 NOTE — Progress Notes (Signed)
? ?Cardiology Office Note ? ? ?Date:  03/31/2022  ? ?ID:  [name], DOB [birth year], MRN [id number] ? ?PCP:  Ladell Pier, MD  ?Cardiologist:   Mertie Moores, MD  ? ?Chief Complaint  ?Patient presents with  ? Atrial Fibrillation  ? ?1. Paroxysmal Atrial fibrillation ?2. Hypothyroidism ?3. Type 2 diabetes mellitus ?4. Chronic systolic CHF ? ? ? ?Kelly Lang is a 61 y.o. female with a hx of T2DM, HL, thyroid disease and atrial fibrillation.  She had been on Synthroid for some time after a thyroid bx in 1996.  She was admitted 10/2013 with AFib with RVR.  Echo (10/26/13):  EF 40-45%, normal wall motion, mild LAE.  CHADS2-VASc= 3 (LV dysfunction, DM, female).  She was placed on coumadin.  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  She was last seen 11/29/2013.  She was set up for DCCV after 3-4 weeks of appropriate anticoagulation.  DCCV was performed 12/07/2013 but was unsuccessful.  She returns for follow up. ? ?She is stable. She is frustrated with her atrial fibrillation. She does get tired more easily. She denies chest pain. She denies significant dyspnea. She is NYHA class II. She denies syncope. She denies orthopnea, PND, edema, palpitations. ? ?Feb. 16, 2015: ? ?She had a cardioversion twice.  Converted back to Afib both times. ?She has had a stress myoview which was negative for ischemia.    we will start flecainide today. ? ?February 15, 2014: ? ?Kelly Lang returns for further evaluation of her atrial fib.  She has been on Flecainide 100 BID for a month.  She started feeling better about 2 weeks ago.  She has converted to NSR.   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   ? ?Sept. 16, 2015: ? ?Kelly Lang is doing well.  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   ? ? ?February 13, 2015: ? ?Kelly Lang is a 61 y.o. female who presents for follow-up of atrial fibrillation. ?Is on coumadin . INR is 2.0 this am.   ?No CP or dyspnea.   ?Glucose  levels are ok.   ?HbA1C is a bit high . Was started on Tragenta. ?Only a little bit of exercise.  ? ?Missed 4 days of flecainide due to insurance not paying for it. ?Staying in NSR ? ?Nov. 29, 2016: ? ?Had a CVA in Oct, 2016.  Had not been on her coumadin and all of her medications.  ?Echo revealed severe LV dysfunction with EF of 20-25%.  ? ?Was started on Xarelto and metoprolol at that time  ? ?Has had some some chills and achy feeling for the past couple of days. ?No cough, no fever, no runny nose.  ?Cannot tell when she has atrial fib ?Not Exercising regularly  ?Works in [employer]	[place] of a hotel and also in [employer]  ?Glucose levels are ok  ?Sleeping ok .    ? ?February 08, 2016: ? ?Kelly Lang has been back on her meds.   She had stopped her medications and had developed worsening congestive heart failure. Previous echocardiogram revealed an EF of 25%. She restart her medications and the recent echo 2 days ago reveals recovery of her left ventricular systolic function to an ejection fraction 45% which is her normal. ? ?She's feeling quite a bit better. ?She has taken flecainide in the past which helped her stay in normal sinus rhythm. She stopped the flecainide due  to lack of insurance. She now has [employer] and wants to get back on flecainide. ? ?March 07, 2016: ? ?Kelly Lang is seen back today for follow-up of her atrial fibrillation. She's been on flecainide 100 mg twice a day.  She's had her stress test for flecainide testing and it revealed no QRS widening. ?She had an unsuccessful cardioversion Jan. 7, 2015 ?She cannot tell how much she is in NSR vs. Afib.  ? ?Oct. 9, 2017: ? ?She is s/p cardioversion in April ?She is feeling better.  ?Able to walk without any dyspnea  ? ?May 18, 2017: ? ?Kelly Lang is seen back today for follow up  ?No CP or dyspnea  ?Is now on Jardiance for the past week  ? ?Apr 12, 2018  ?Kelly Lang is seen for follow up visit  ?Has had an ablation since I last  ?Feels so much better.     Able to walk but not getting any regular exercise ? ?Still on Xarelto. ? ?Body mass index is 39.88 kg/m?. ? ?October 05, 2019: ?Kelly Lang is seen today for follow-up visit.  He has a history of paroxysmal atrial fibrillation, obesity, hyperlipidemia and diabetes mellitus. ?Is not exercising much .  ?Working on getting diabetes regulated  ? ?Wt is 250 ( up 6 lbs)  ?Is s/p AFIB ablation  ? ? ?February 26, 2021: ? ?Kelly Lang is seen today - hx of atrial fib, - s/p ablation ?Hx of HLD, DM, obesity ?Wt is 235 lbs  ? ?Chol levels are ok  ?Chol = 115 ?HDL = 32 ?LDL = 49 ?Trigs are 208  !! ? ?Is trying to lose some weight  ? ?Mar 31, 2022: ?Kelly Lang is seen for follow  up ?Is maintaining  NSR  ? ?She still on Xarelto. ? ?Weight today is 231 pounds.  She is down 19 pounds from 3 years ago. ?Past Medical History:  ?Diagnosis Date  ? Arthritis   ? "all over" (06/30/2017)  ? Chicken pox   ? Dyslipidemia   ? Dysrhythmia ablation 2018  ? a-fib  ? GERD (gastroesophageal reflux disease)   ? H/O: hypothyroidism   ? a. thyroid biopsy 1996 with synthroid. Stopped taking rx and was loss to follow up b. normal thyroid function 10/20/13  ? High cholesterol   ? Hx of cardiovascular stress test   ? ETT/Lexiscan Myoview (12/2013):  No ischemia; not gated; low risk.  ? Hypertension   ? Obesity   ? Persistent atrial fibrillation ([place])   ? a diagnosed 10/25/2013  ? Psoriasis   ? on Skyrizi  ? Seasonal allergies   ? Stroke [facility]) 2016  ? "some speech problems since" (06/30/2017)  ? 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 a. 2/2 Afib with RVR for unknown duration (EF: 40-45%)  ? Thyroid disease   ? Type II diabetes mellitus ([place])   ? a. hg A1c 10, newly diagnosed (10/26/13)  ? ? ?Past Surgical History:  ?Procedure Laterality Date  ? ATRIAL FIBRILLATION ABLATION  06/30/2017  ? ATRIAL FIBRILLATION ABLATION N/A 06/30/2017  ? Procedure: Atrial Fibrillation Ablation;  Surgeon: Thompson Grayer, MD;  Location: [place] [facility];  Service: Cardiovascular;  Laterality:  N/A;  ? BICEPT TENODESIS Right 05/24/2020  ? Procedure: BICEPS TENODESIS;  Surgeon: Meredith Pel, MD;  Location: [place];  Service: Orthopedics;  Laterality: Right;  ? BIOPSY THYROID  1996  ? CARDIOVERSION N/A 12/07/2013  ? Procedure: CARDIOVERSION;  Surgeon: Casandra Doffing, MD;  Location: [facility] ENDOSCOPY;  Service: Cardiovascular;  Laterality: N/A;  ? CARDIOVERSION N/A 03/10/2016  ? Procedure: CARDIOVERSION;  Surgeon: Thayer Headings, MD;  Location: [facility] ENDOSCOPY;  Service: Cardiovascular;  Laterality: N/A;  ? ESOPHAGOGASTRODUODENOSCOPY N/A 12/07/2017  ? Procedure: ESOPHAGOGASTRODUODENOSCOPY (EGD);  Surgeon: Irene Shipper, MD;  Location[facility] ENDOSCOPY;  Service: Endoscopy;  Laterality: N/A;  ? EYE MUSCLE SURGERY Right ~ 1966  ? SHOULDER ARTHROSCOPY WITH ROTATOR CUFF REPAIR Right 05/24/2020  ? Procedure: right shoulder arthroscopy, rotator cuff tear repair biceps tenodesis;  Surgeon: Meredith Pel, MD;  Location: [place];  Service: Orthopedics;  Laterality: Right;  ? TEE WITHOUT CARDIOVERSION N/A 06/29/2017  ? Procedure: TRANSESOPHAGEAL ECHOCARDIOGRAM (TEE);  Surgeon: Fay Records, MD;  Location: [facility] ENDOSCOPY;  Service: Cardiovascular;  Laterality: N/A;  ? TONSILLECTOMY  1971  ? ? ? ?Current Outpatient Medications  ?Medication Sig Dispense Refill  ? atorvastatin (LIPITOR) 40 MG tablet Take 1 tablet (40 mg total) by mouth daily. 90 tablet 3  ? Continuous Blood Gluc Receiver (DEXCOM G7 RECEIVER) DEVI Use to monitor BG continuously 1 each 0  ? Continuous Blood Gluc Sensor (DEXCOM G7 SENSOR) MISC Change sensor every 10 days 3 each 2  ? diclofenac Sodium (VOLTAREN) 1 % GEL Apply 2 g topically 4 (four) times daily. 100 g 2  ? DULoxetine (CYMBALTA) 30 MG capsule Take 1 capsule (30 mg total) by mouth daily. 90 capsule 0  ? Fluticasone-Salmeterol (ADVAIR) 250-50 MCG/DOSE AEPB Inhale 1 puff into the lungs 2 (two) times daily. 60 each 0  ? glipiZIDE (GLUCOTROL XL) 5 MG 24 hr tablet Take 1  tablet (5 mg total) by mouth daily with breakfast. 90 tablet 1  ? lisinopril (ZESTRIL) 10 MG tablet TAKE 1 TABLET BY MOUTH ONCE DAILY . APPOINTMENT REQUIRED FOR FUTURE REFILLS 90 tablet 0  ? metFORMIN (

## 2022-03-31 NOTE — Patient Instructions (Signed)
Medication Instructions:  ?Your physician recommends that you continue on your current medications as directed. Please refer to the Current Medication list given to you today. ? ?*If you need a refill on your cardiac medications before your next appointment, please call your pharmacy* ? ? ?Lab Work: ?None ?If you have labs (blood work) drawn today and your tests are completely normal, you will receive your results only by: ?MyChart Message (if you have MyChart) OR ?A paper copy in the mail ?If you have any lab test that is abnormal or we need to change your treatment, we will call you to review the results. ? ? ?Follow-Up: ?At [facility], you and your health needs are our priority.  As part of our continuing mission to provide you with exceptional heart care, we have created designated Provider Care Teams.  These Care Teams include your primary Cardiologist (physician) and Advanced Practice Providers (APPs -  Physician Assistants and Nurse Practitioners) who all work together to provide you with the care you need, when you need it. ? ?Your next appointment:   ?1 year(s) ? ?The format for your next appointment:   ?In Person ? ?Provider:   ?Philip Nahser, MD   ? ?

## 2022-04-02 ENCOUNTER — Encounter: Payer: Self-pay | Admitting: Physician Assistant

## 2022-04-02 ENCOUNTER — Ambulatory Visit (INDEPENDENT_AMBULATORY_CARE_PROVIDER_SITE_OTHER): Payer: 59 | Admitting: Physician Assistant

## 2022-04-02 VITALS — BP 120/62 | HR 68 | Ht 65.5 in | Wt 232.0 lb

## 2022-04-02 DIAGNOSIS — I10 Essential (primary) hypertension: Secondary | ICD-10-CM

## 2022-04-02 DIAGNOSIS — I4819 Other persistent atrial fibrillation: Secondary | ICD-10-CM

## 2022-04-02 NOTE — Patient Instructions (Signed)
Medication Instructions:  ? ?Your physician recommends that you continue on your current medications as directed. Please refer to the Current Medication list given to you today. ? ?*If you need a refill on your cardiac medications before your next appointment, please call your pharmacy* ? ? ?Lab Work: NONE ORDERED  TODAY ? ? ?If you have labs (blood work) drawn today and your tests are completely normal, you will receive your results only by: ?MyChart Message (if you have MyChart) OR ?A paper copy in the mail ?If you have any lab test that is abnormal or we need to change your treatment, we will call you to review the results. ? ? ?Testing/Procedures: NONE ORDERED  TODAY ? ? ? ? ?Follow-Up: ?At [facility], you and your health needs are our priority.  As part of our continuing mission to provide you with exceptional heart care, we have created designated Provider Care Teams.  These Care Teams include your primary Cardiologist (physician) and Advanced Practice Providers (APPs -  Physician Assistants and Nurse Practitioners) who all work together to provide you with the care you need, when you need it. ? ?We recommend signing up for the patient portal called "MyChart".  Sign up information is provided on this After Visit Summary.  MyChart is used to connect with patients for Virtual Visits (Telemedicine).  Patients are able to view lab/test results, encounter notes, upcoming appointments, etc.  Non-urgent messages can be sent to your provider as well.   ?To learn more about what you can do with MyChart, go to [email or web address].   ? ?Your next appointment:   CONTACT [facility] [phone] AS NEEDED FOR  ANY CARDIAC RELATED SYMPTOMS ? ?Important Information About Sugar ? ? ? ? ?  ?

## 2022-04-03 ENCOUNTER — Ambulatory Visit: Payer: 59 | Admitting: Urology

## 2022-04-29 ENCOUNTER — Other Ambulatory Visit: Payer: Self-pay

## 2022-04-29 ENCOUNTER — Emergency Department (HOSPITAL_BASED_OUTPATIENT_CLINIC_OR_DEPARTMENT_OTHER)
Admission: EM | Admit: 2022-04-29 | Discharge: 2022-04-29 | Disposition: A | Discharge status: Home / Self Care | Payer: 59 | Attending: Emergency Medicine | Admitting: Emergency Medicine

## 2022-04-29 ENCOUNTER — Other Ambulatory Visit: Payer: Self-pay | Admitting: "Endocrinology

## 2022-04-29 ENCOUNTER — Emergency Department (HOSPITAL_BASED_OUTPATIENT_CLINIC_OR_DEPARTMENT_OTHER): Payer: 59

## 2022-04-29 ENCOUNTER — Encounter (HOSPITAL_BASED_OUTPATIENT_CLINIC_OR_DEPARTMENT_OTHER): Payer: Self-pay | Admitting: Obstetrics and Gynecology

## 2022-04-29 ENCOUNTER — Ambulatory Visit
Admission: EM | Admit: 2022-04-29 | Discharge: 2022-04-29 | Disposition: A | Discharge status: Home / Self Care | Payer: 59

## 2022-04-29 DIAGNOSIS — Z7901 Long term (current) use of anticoagulants: Secondary | ICD-10-CM | POA: Insufficient documentation

## 2022-04-29 DIAGNOSIS — Z79899 Other long term (current) drug therapy: Secondary | ICD-10-CM | POA: Insufficient documentation

## 2022-04-29 DIAGNOSIS — E1140 Type 2 diabetes mellitus with diabetic neuropathy, unspecified: Secondary | ICD-10-CM | POA: Diagnosis not present

## 2022-04-29 DIAGNOSIS — N2889 Other specified disorders of kidney and ureter: Secondary | ICD-10-CM | POA: Insufficient documentation

## 2022-04-29 DIAGNOSIS — N3000 Acute cystitis without hematuria: Secondary | ICD-10-CM | POA: Diagnosis not present

## 2022-04-29 DIAGNOSIS — I5020 Unspecified systolic (congestive) heart failure: Secondary | ICD-10-CM | POA: Diagnosis not present

## 2022-04-29 DIAGNOSIS — E039 Hypothyroidism, unspecified: Secondary | ICD-10-CM | POA: Diagnosis not present

## 2022-04-29 DIAGNOSIS — R1111 Vomiting without nausea: Secondary | ICD-10-CM

## 2022-04-29 DIAGNOSIS — Z7984 Long term (current) use of oral hypoglycemic drugs: Secondary | ICD-10-CM | POA: Insufficient documentation

## 2022-04-29 DIAGNOSIS — K3184 Gastroparesis: Secondary | ICD-10-CM | POA: Diagnosis not present

## 2022-04-29 DIAGNOSIS — R1013 Epigastric pain: Secondary | ICD-10-CM | POA: Diagnosis present

## 2022-04-29 DIAGNOSIS — E1142 Type 2 diabetes mellitus with diabetic polyneuropathy: Secondary | ICD-10-CM

## 2022-04-29 LAB — CBC
HCT: 40.4 % (ref 36.0–46.0)
Hemoglobin: 13.6 g/dL (ref 12.0–15.0)
MCH: 31.9 pg (ref 26.0–34.0)
MCHC: 33.7 g/dL (ref 30.0–36.0)
MCV: 94.8 fL (ref 80.0–100.0)
Platelets: 223 10*3/uL (ref 150–400)
RBC: 4.26 MIL/uL (ref 3.87–5.11)
RDW: 12.7 % (ref 11.5–15.5)
WBC: 8.4 10*3/uL (ref 4.0–10.5)
nRBC: 0 % (ref 0.0–0.2)

## 2022-04-29 LAB — COMPREHENSIVE METABOLIC PANEL
ALT: 24 U/L (ref 0–44)
AST: 14 U/L — ABNORMAL LOW (ref 15–41)
Albumin: 3.8 g/dL (ref 3.5–5.0)
Alkaline Phosphatase: 49 U/L (ref 38–126)
Anion gap: 13 (ref 5–15)
BUN: 12 mg/dL (ref 6–20)
CO2: 21 mmol/L — ABNORMAL LOW (ref 22–32)
Calcium: 8.9 mg/dL (ref 8.9–10.3)
Chloride: 103 mmol/L (ref 98–111)
Creatinine, Ser: 0.67 mg/dL (ref 0.44–1.00)
GFR, Estimated: >60 mL/min (ref >60)
Glucose, Bld: 79 mg/dL (ref 70–99)
Potassium: 3.8 mmol/L (ref 3.5–5.1)
Sodium: 137 mmol/L (ref 135–145)
Total Bilirubin: 0.8 mg/dL (ref 0.3–1.2)
Total Protein: 7.4 g/dL (ref 6.5–8.1)

## 2022-04-29 LAB — LIPASE, BLOOD: Lipase: 27 U/L (ref 11–51)

## 2022-04-29 LAB — URINALYSIS, ROUTINE W REFLEX MICROSCOPIC
Bilirubin Urine: NEGATIVE
Glucose, UA: NEGATIVE mg/dL
Hgb urine dipstick: NEGATIVE
Ketones, ur: NEGATIVE mg/dL
Nitrite: POSITIVE — AB
Protein, ur: 30 mg/dL — AB
Specific Gravity, Urine: 1.021 (ref 1.005–1.030)
pH: 5 (ref 5.0–8.0)

## 2022-04-29 MED ORDER — METOCLOPRAMIDE HCL 5 MG/ML IJ SOLN
5.0000 mg | Freq: Once | INTRAMUSCULAR | Status: AC
  Administered 2022-04-29: 5 mg via INTRAVENOUS
  Filled 2022-04-29: qty 2

## 2022-04-29 MED ORDER — IOHEXOL 300 MG/ML  SOLN
100.0000 mL | Freq: Once | INTRAMUSCULAR | Status: AC | PRN
  Administered 2022-04-29: 100 mL via INTRAVENOUS

## 2022-04-29 MED ORDER — METOCLOPRAMIDE HCL 10 MG PO TABS: 10.0000 mg | ORAL_TABLET | Freq: Four times a day (QID) | ORAL | 0 refills | Status: DC

## 2022-04-29 MED ORDER — OMEPRAZOLE 20 MG PO CPDR: 20.0000 mg | DELAYED_RELEASE_CAPSULE | Freq: Every day | ORAL | 0 refills | Status: DC

## 2022-04-29 MED ORDER — FAMOTIDINE IN NACL 20-0.9 MG/50ML-% IV SOLN
20.0000 mg | Freq: Once | INTRAVENOUS | Status: AC
  Administered 2022-04-29: 20 mg via INTRAVENOUS
  Filled 2022-04-29: qty 50

## 2022-04-29 MED ORDER — SODIUM CHLORIDE 0.9 % IV BOLUS
1000.0000 mL | Freq: Once | INTRAVENOUS | Status: AC
  Administered 2022-04-29: 1000 mL via INTRAVENOUS

## 2022-04-29 MED ORDER — NITROFURANTOIN MONOHYD MACRO 100 MG PO CAPS: 100.0000 mg | ORAL_CAPSULE | Freq: Two times a day (BID) | ORAL | 0 refills | Status: DC

## 2022-04-29 NOTE — ED Notes (Signed)
Patient is being discharged from the Urgent Care and sent to the Emergency Department via POA. Per L. Morgan-Scales PA-C , patient is in need of higher level of care due to need for further evaluation. Patient is aware and verbalizes understanding of plan of care.  Vitals:   04/29/22 1455  BP: 102/71  Pulse: 90  Resp: 18  Temp: 99.4 F (37.4 C)  SpO2: 94%

## 2022-04-29 NOTE — Discharge Instructions (Addendum)
Follow-up with GI doctor as soon as possible for further treatment/management of your symptoms.  He/she will be the main prescribing provider for your medicines for gastroparesis.  I will give you 1 month supply of medicine Reglan and Prilosec in the meantime.  Your work-up today was significant for urinary tract infection.  Please take antibiotic as written twice a day for the next 5 days.  Please follow-up with your primary care provider regarding concerning left renal mass for further diagnostic imaging.  Please not hesitate to return to emergency department if the worrisome signs and symptoms we discussed become apparent.

## 2022-04-29 NOTE — ED Provider Notes (Signed)
Patient presents urgent care today complaining of being unable to tolerate solid foods, states she vomits everything she eats.  Patient states she is tolerating small amounts of clear liquids but nothing else.    Patient states when she attempts to eat solid foods, she has a sharp pain in her stomach that she describes as sharp pins-and-needles and then vomits.    Patient states she has had a few small bowel movements productive of firm stool in the past 48 hours, patient adds that she is also had a significant amount of liquid diarrhea as well, not currently passing flatus.  Patient reports a history of gastroparesis.    EMR reviewed, patient is a poorly controlled type II diabetic, last A1c was 9.1 performed January 22, 2022.    Patient advised to go to the emergency room now she likely needs a CT scan of her abdomen.  Patient agreed to to go now.   Theadora Rama Scales, PA-C 04/29/22 1515

## 2022-04-29 NOTE — ED Triage Notes (Signed)
Triaged by the provider.  

## 2022-04-29 NOTE — ED Triage Notes (Signed)
Patient reports to the ER for abdominal pain. Patient reports she has gastroparesis and is having issues with that. She reports being unable to keep anything down. Patient reports x2 days of symptoms. Endorses diarrhea as well.

## 2022-04-29 NOTE — ED Notes (Signed)
Patient transported to CT 

## 2022-04-29 NOTE — ED Provider Notes (Signed)
[facility] EMERGENCY DEPT Provider Note   CSN: [id number] Arrival date & time: 04/29/22  1529     History  Chief Complaint  Patient presents with   Abdominal Pain    Kelly Lang is a 61 y.o. female.   Abdominal Pain Associated symptoms: nausea and vomiting   Associated symptoms: no chest pain, no chills, no cough, no diarrhea, no dysuria, no fever, no hematuria, no shortness of breath and no sore throat    61 year old female presents emergency department with 2-day history of abdominal pain, vomiting.  Patient has a longstanding history of gastroparesis which has been well controlled with Reglan as well as Prilosec over the past 2 years.  She states that she ran out of her Reglan 2 days ago and developed symptoms afterward.  She states that her abdominal pain begins 20-30 minutes after eating and is associated with vomiting thereafter. Liquids does not exacerbate symptoms. Pain is located in epigastric region.  She has not visited her GI doctor for refill medication. Denies fever, chills, night sweats, chest pain, shortness of breath,, urinary/vaginal symptoms, change in bowel habits, hematemesis, hematochezia, melena.   PMH significant for atrib fibrillation, hypothyroidism, hypothyroidism, CVA, DM2, systolic CHF, hepatic steatosis, gastroparesis, neuropathy, HLD.  Home Medications Prior to Admission medications   Medication Sig Start Date End Date Taking? Authorizing Provider  metoCLOPramide (REGLAN) 10 MG tablet Take 1 tablet (10 mg total) by mouth every 6 (six) hours. 04/29/22  Yes Sherian Maroon A, PA  nitrofurantoin, macrocrystal-monohydrate, (MACROBID) 100 MG capsule Take 1 capsule (100 mg total) by mouth 2 (two) times daily. 04/29/22  Yes Sherian Maroon A, PA  omeprazole (PRILOSEC) 20 MG capsule Take 1 capsule (20 mg total) by mouth daily. 04/29/22  Yes Sherian Maroon A, PA  atorvastatin (LIPITOR) 40 MG tablet Take 1 tablet (40 mg total) by mouth daily.  02/25/22 05/26/22  Claiborne Rigg, NP  Continuous Blood Gluc Receiver (DEXCOM G7 RECEIVER) DEVI Use to monitor BG continuously 03/12/22   Roma Kayser, MD  Continuous Blood Gluc Sensor (DEXCOM G7 SENSOR) MISC Change sensor every 10 days 03/12/22   Roma Kayser, MD  diclofenac Sodium (VOLTAREN) 1 % GEL Apply 2 g topically 4 (four) times daily. 06/25/21   Marcine Matar, MD  DULoxetine (CYMBALTA) 30 MG capsule Take 1 capsule (30 mg total) by mouth daily. 02/25/22   Claiborne Rigg, NP  Fluticasone-Salmeterol (ADVAIR) 250-50 MCG/DOSE AEPB Inhale 1 puff into the lungs 2 (two) times daily. Patient not taking: Reported on 04/02/2022 03/31/22   [provider]  glipiZIDE (GLUCOTROL XL) 5 MG 24 hr tablet Take 1 tablet (5 mg total) by mouth daily with breakfast. 03/12/22   Nida, Denman George, MD  lisinopril (ZESTRIL) 10 MG tablet TAKE 1 TABLET BY MOUTH ONCE DAILY . APPOINTMENT REQUIRED FOR FUTURE REFILLS 03/25/22   Marcine Matar, MD  metFORMIN (GLUCOPHAGE) 1000 MG tablet Take 1 tablet (1,000 mg total) by mouth 2 (two) times daily with a meal. 02/25/22   Claiborne Rigg, NP  metoprolol tartrate (LOPRESSOR) 25 MG tablet Take 1.5 tablets (37.5 mg total) by mouth 2 (two) times daily. 02/26/21   Nahser, [name], MD  mupirocin ointment (BACTROBAN) 2 % Apply 1 application topically daily. 07/01/21   McDonald, Rachelle Hora, DPM  ONETOUCH DELICA LANCETS 33G MISC Use as instructed to check blood sugar up to 3 times daily. 10/05/18   Marcine Matar, MD  ONETOUCH VERIO test strip USE STRIPS TO CHECK GLUCOSE  UP TO THREE TIMES DAILY AS DIRECTED 12/14/20   Marcine Matar, MD  rivaroxaban (XARELTO) 20 MG TABS tablet Take 1 tablet (20 mg total) by mouth daily with supper. 02/26/21   Nahser, [name], MD  Semaglutide, 2 MG/DOSE, (OZEMPIC, 2 MG/DOSE,) 8 MG/3ML SOPN INJECT 2MG  SUBCUTANEOUSLY ONCE A WEEK 04/29/22   Reardon, 05/01/22, NP  SEMGLEE, YFGN, 100 UNIT/ML Pen INJECT 80 UNITS SUBCUTANEOUSLY  AT  BEDTIME 04/30/22   Nida, 05/02/22, MD  SKYRIZI, 150 MG DOSE, 75 MG/0.83ML PSKT Inject 150 mg as directed See admin instructions. 4x's per year 08/25/19   [provider]  TRUEPLUS PEN NEEDLES 32G X 4 MM MISC USE AS DIRECTED AT BEDTIME. 08/23/20   08/25/20, MD      Allergies    Jardiance [empagliflozin] and Codeine    Review of Systems   Review of Systems  Constitutional:  Negative for chills and fever.  HENT:  Negative for ear pain and sore throat.   Eyes:  Negative for pain and visual disturbance.  Respiratory:  Negative for cough and shortness of breath.   Cardiovascular:  Negative for chest pain and palpitations.  Gastrointestinal:  Positive for abdominal pain, nausea and vomiting. Negative for diarrhea.  Genitourinary:  Negative for dysuria and hematuria.  Musculoskeletal:  Negative for arthralgias and back pain.  Skin:  Negative for color change and rash.  Neurological:  Negative for seizures and syncope.  All other systems reviewed and are negative.  Physical Exam Updated Vital Signs BP 115/68 (BP Location: Right Arm)   Pulse 77   Temp 98.9 F (37.2 C) (Oral)   Resp 16   LMP 04/15/2015 (LMP Unknown) Comment: last in may  SpO2 95%  Physical Exam Vitals and nursing note reviewed.  Constitutional:      General: She is not in acute distress.    Appearance: She is well-developed. She is obese. She is not ill-appearing, toxic-appearing or diaphoretic.  HENT:     Head: Normocephalic and atraumatic.     Mouth/Throat:     Mouth: Mucous membranes are moist.  Eyes:     General: No scleral icterus.    Extraocular Movements: Extraocular movements intact.     Conjunctiva/sclera: Conjunctivae normal.  Cardiovascular:     Rate and Rhythm: Normal rate and regular rhythm.     Heart sounds: No murmur heard. Pulmonary:     Effort: Pulmonary effort is normal. No respiratory distress.     Breath sounds: Normal breath sounds.  Abdominal:     General: There is  no distension.     Palpations: Abdomen is soft.     Tenderness: There is abdominal tenderness in the epigastric area. There is no right CVA tenderness, left CVA tenderness, guarding or rebound. Negative signs include Murphy's sign, Rovsing's sign and McBurney's sign.  Musculoskeletal:        General: No swelling.     Cervical back: Neck supple.  Skin:    General: Skin is warm and dry.     Capillary Refill: Capillary refill takes less than 2 seconds.  Neurological:     General: No focal deficit present.     Mental Status: She is alert and oriented to person, place, and time.  Psychiatric:        Mood and Affect: Mood normal.        Behavior: Behavior normal.    ED Results / Procedures / Treatments   Labs (all labs ordered are listed, but only abnormal  results are displayed) Labs Reviewed  COMPREHENSIVE METABOLIC PANEL - Abnormal; Notable for the following components:      Result Value   CO2 21 (*)    AST 14 (*)    All other components within normal limits  URINALYSIS, ROUTINE W REFLEX MICROSCOPIC - Abnormal; Notable for the following components:   Protein, ur 30 (*)    Nitrite POSITIVE (*)    Leukocytes,Ua MODERATE (*)    Bacteria, UA MANY (*)    All other components within normal limits  LIPASE, BLOOD  CBC    EKG None  Radiology No results found.  Procedures Procedures    Medications Ordered in ED Medications  sodium chloride 0.9 % bolus 1,000 mL ( Intravenous Stopped 04/29/22 1844)  metoCLOPramide (REGLAN) injection 5 mg (5 mg Intravenous Given 04/29/22 1727)  famotidine (PEPCID) IVPB 20 mg premix (0 mg Intravenous Stopped 04/29/22 1751)  iohexol (OMNIPAQUE) 300 MG/ML solution 100 mL (100 mLs Intravenous Contrast Given 04/29/22 1815)    ED Course/ Medical Decision Making/ A&P Clinical Course as of 05/02/22 1139  Tue Apr 29, 2022  1840 Patient noted significant improvement after administration of Reglan, Pepcid and 1 L normal saline. [CR]    Clinical Course User  Index [CR] Peter Garter, PA                           Medical Decision Making Amount and/or Complexity of Data Reviewed Labs: ordered. Radiology: ordered.  Risk Prescription drug management.   This patient presents to the ED for concern of epigastric pain/vomiting, this involves an extensive number of treatment options, and is a complaint that carries with it a high risk of complications and morbidity.  The differential diagnosis includes The causes of generalized abdominal pain include but are not limited to AAA, mesenteric ischemia, appendicitis, diverticulitis, DKA, gastritis, gastroenteritis, AMI, nephrolithiasis, pancreatitis, peritonitis, adrenal insufficiency,lead poisoning, iron toxicity, intestinal ischemia, constipation, UTI,SBO/LBO, splenic rupture, biliary disease, IBD, IBS, PUD, or hepatitis. Ectopic pregnancy, ovarian torsion, PID.    Co morbidities that complicate the patient evaluation  Obesity, a fibrillation, hypothyroidism, hypothyroidism, CVA, DM2, systolic CHF, hepatic steatosis, gastroparesis, neuropathy, HLD.   Lab Tests:  I Ordered, and personally interpreted labs.  The pertinent results include:  UA significant for protein 30, nitrite positive, leukocyte moderate, many bacteria, WBC 21-50   Imaging Studies ordered:  I ordered imaging studies including CT abdomen pelvis  I independently visualized and interpreted imaging which showed  No acute abnormality 1.1cm left renal lesion Scattered colonic diverticulosis w/ no acute diverticulitis I agree with the radiologist interpretation    Cardiac Monitoring: / EKG:  The patient was maintained on a cardiac monitor.  I personally viewed and interpreted the cardiac monitored which showed an underlying rhythm of: sinus rhythm   Consultations Obtained:  N/a   Problem List / ED Course / Critical interventions / Medication management  Abdominal pain/vomiting I ordered medication including  pepcid,reglan 1L NS for gastroparesis tx and rehydration    Reevaluation of the patient after these medicines showed that the patient resolved I have reviewed the patients home medicines and have made adjustments as needed   Social Determinants of Health:  Obesity. Denies tobacco/illicit drug use   Test / Admission - Considered:  Abdominal pain/vomiting Vitals signs within normal range and stable throughout visit. Laboratory studies significant for UTI. Will cover with abx including macrobid. Imaging studies significant for: no acute abnormalities. Other findings noted above Patient  discharge with refill of metoclopramide and pepcid for gastroparesis type symptoms. Seems most likely diagnosis given history, negative CT/laboratory findings, PE. Patient recommended close f/u with GI doctor for further evaluation/tx of symptoms.  Patient made aware of incidental CT findings and advised to f/u w/ PCP concerning them Worrisome signs and symptoms were discussed with the patient, and the patient acknowledged understanding to return to the ED if noticed. Patient was stable upon discharge.          Final Clinical Impression(s) / ED Diagnoses Final diagnoses:  Acute cystitis without hematuria  Gastroparesis  Left renal mass    Rx / DC Orders ED Discharge Orders          Ordered    omeprazole (PRILOSEC) 20 MG capsule  Daily        04/29/22 1847    metoCLOPramide (REGLAN) 10 MG tablet  Every 6 hours        04/29/22 1847    nitrofurantoin, macrocrystal-monohydrate, (MACROBID) 100 MG capsule  2 times daily        04/29/22 1847              Peter Garter, PA 05/02/22 1139    Benjiman Core, MD 05/08/22 [phone]

## 2022-05-08 ENCOUNTER — Encounter: Payer: Self-pay | Admitting: Physician Assistant

## 2022-05-08 ENCOUNTER — Ambulatory Visit: Payer: 59 | Attending: Physician Assistant | Admitting: Physician Assistant

## 2022-05-08 VITALS — BP 140/70 | HR 64 | Wt 235.8 lb

## 2022-05-08 DIAGNOSIS — N3000 Acute cystitis without hematuria: Secondary | ICD-10-CM | POA: Diagnosis not present

## 2022-05-08 DIAGNOSIS — E669 Obesity, unspecified: Secondary | ICD-10-CM

## 2022-05-08 DIAGNOSIS — Z09 Encounter for follow-up examination after completed treatment for conditions other than malignant neoplasm: Secondary | ICD-10-CM | POA: Diagnosis not present

## 2022-05-08 DIAGNOSIS — E1169 Type 2 diabetes mellitus with other specified complication: Secondary | ICD-10-CM | POA: Diagnosis not present

## 2022-05-08 DIAGNOSIS — E785 Hyperlipidemia, unspecified: Secondary | ICD-10-CM

## 2022-05-08 DIAGNOSIS — N2889 Other specified disorders of kidney and ureter: Secondary | ICD-10-CM

## 2022-05-08 LAB — POCT GLYCOSYLATED HEMOGLOBIN (HGB A1C): HbA1c, POC (controlled diabetic range): 7.4 % — AB (ref 0.0–7.0)

## 2022-05-08 LAB — GLUCOSE, POCT (MANUAL RESULT ENTRY): POC Glucose: 103 mg/dl — AB (ref 70–99)

## 2022-05-08 MED ORDER — FLUCONAZOLE 150 MG PO TABS: 150.0000 mg | ORAL_TABLET | Freq: Once | ORAL | 0 refills | Status: AC

## 2022-05-08 NOTE — Progress Notes (Signed)
Patient ID: Kelly Lang, female   DOB: Mar 14, 1961, 61 y.o.   MRN: [id number]    Kelly Lang, is a 61 y.o. female  IDP:[id number]  WER:[id number]  DOB - August 07, 1961  Chief Complaint  Patient presents with   Hospitalization Follow-up       Subjective:   Kelly Lang is a 60 y.o. female here today for a follow up visit After ED visit 04/29/2022 and treated for UTI with macrobid.  She is feeling much better.  Ct showed stable 1.1cm renal mass that needs follow up imaging.  Blood sugars running under 175.  No other issues or concerns.   From ED note: 61 year old female presents emergency department with 2-day history of abdominal pain, vomiting.  Patient has a longstanding history of gastroparesis which has been well controlled with Reglan as well as Prilosec over the past 2 years.  She states that she ran out of her Reglan 2 days ago and developed symptoms afterward.  She states that her abdominal pain begins 20-30 minutes after eating and is associated with vomiting thereafter. Liquids does not exacerbate symptoms. Pain is located in epigastric region.  She has not visited her GI doctor for refill medication. Denies fever, chills, night sweats, chest pain, shortness of breath,, urinary/vaginal symptoms, change in bowel habits, hematemesis, hematochezia, melena.    PMH significant for atrib fibrillation, hypothyroidism, hypothyroidism, CVA, DM2, systolic CHF, hepatic steatosis, gastroparesis, neuropathy, HLD.    No problems updated.  ALLERGIES: Allergies  Allergen Reactions   Jardiance [Empagliflozin] Other (See Comments)    Yeast infections   Codeine Itching and Rash    PAST MEDICAL HISTORY: Past Medical History:  Diagnosis Date   Arthritis    "all over" (06/30/2017)   Chicken pox    Dyslipidemia    Dysrhythmia ablation 2018   a-fib   GERD (gastroesophageal reflux disease)    H/O: hypothyroidism    a. thyroid biopsy 1996 with synthroid. Stopped taking rx and was loss to  follow up b. normal thyroid function 10/20/13   High cholesterol    Hx of cardiovascular stress test    ETT/Lexiscan Myoview (12/2013):  No ischemia; not gated; low risk.   Hypertension    Obesity    Persistent atrial fibrillation (HCC)    a diagnosed 10/25/2013   Psoriasis    on Skyrizi   Seasonal allergies    Stroke [facility]) 2016   "some speech problems since" (06/30/2017)   Tachycardia induced cardiomyopathy (HCC)    a. 2/2 Afib with RVR for unknown duration (EF: 40-45%)   Thyroid disease    Type II diabetes mellitus (HCC)    a. hg A1c 10, newly diagnosed (10/26/13)    MEDICATIONS AT HOME: Prior to Admission medications   Medication Sig Start Date End Date Taking? Authorizing Provider  atorvastatin (LIPITOR) 40 MG tablet Take 1 tablet (40 mg total) by mouth daily. 02/25/22 05/26/22 Yes Claiborne Rigg, NP  Continuous Blood Gluc Receiver (DEXCOM G7 RECEIVER) DEVI Use to monitor BG continuously 03/12/22  Yes Nida, Denman George, MD  Continuous Blood Gluc Sensor (DEXCOM G7 SENSOR) MISC Change sensor every 10 days 03/12/22  Yes Nida, Denman George, MD  diclofenac Sodium (VOLTAREN) 1 % GEL Apply 2 g topically 4 (four) times daily. 06/25/21  Yes Marcine Matar, MD  DULoxetine (CYMBALTA) 30 MG capsule Take 1 capsule (30 mg total) by mouth daily. 02/25/22  Yes Claiborne Rigg, NP  fluconazole (DIFLUCAN) 150 MG tablet Take 1 tablet (150 mg total) by mouth  once for 1 dose. 05/08/22 05/08/22 Yes Yago Ludvigsen, Marzella Schlein, PA-C  glipiZIDE (GLUCOTROL XL) 5 MG 24 hr tablet Take 1 tablet (5 mg total) by mouth daily with breakfast. 03/12/22  Yes Nida, Denman George, MD  lisinopril (ZESTRIL) 10 MG tablet TAKE 1 TABLET BY MOUTH ONCE DAILY . APPOINTMENT REQUIRED FOR FUTURE REFILLS 03/25/22  Yes Marcine Matar, MD  metFORMIN (GLUCOPHAGE) 1000 MG tablet Take 1 tablet (1,000 mg total) by mouth 2 (two) times daily with a meal. 02/25/22  Yes Claiborne Rigg, NP  metoCLOPramide (REGLAN) 10 MG tablet Take 1 tablet  (10 mg total) by mouth every 6 (six) hours. 04/29/22  Yes Sherian Maroon A, PA  metoprolol tartrate (LOPRESSOR) 25 MG tablet Take 1.5 tablets (37.5 mg total) by mouth 2 (two) times daily. 02/26/21  Yes Nahser, [name], MD  mupirocin ointment (BACTROBAN) 2 % Apply 1 application topically daily. 07/01/21  Yes McDonald, Rachelle Hora, DPM  omeprazole (PRILOSEC) 20 MG capsule Take 1 capsule (20 mg total) by mouth daily. 04/29/22  Yes Peter Garter, PA  ONETOUCH DELICA LANCETS 33G MISC Use as instructed to check blood sugar up to 3 times daily. 10/05/18  Yes Marcine Matar, MD  ONETOUCH VERIO test strip USE STRIPS TO CHECK GLUCOSE UP TO THREE TIMES DAILY AS DIRECTED 12/14/20  Yes Marcine Matar, MD  rivaroxaban (XARELTO) 20 MG TABS tablet Take 1 tablet (20 mg total) by mouth daily with supper. 02/26/21  Yes Nahser, [name], MD  Semaglutide, 2 MG/DOSE, (OZEMPIC, 2 MG/DOSE,) 8 MG/3ML SOPN INJECT 2MG  SUBCUTANEOUSLY ONCE A WEEK 04/29/22  Yes Reardon, Whitney J, NP  SEMGLEE, YFGN, 100 UNIT/ML Pen INJECT 80 UNITS SUBCUTANEOUSLY  AT BEDTIME 04/30/22  Yes Nida, Denman George, MD  SKYRIZI, 150 MG DOSE, 75 MG/0.83ML PSKT Inject 150 mg as directed See admin instructions. 4x's per year 08/25/19  Yes [provider]  TRUEPLUS PEN NEEDLES 32G X 4 MM MISC USE AS DIRECTED AT BEDTIME. 08/23/20  Yes Marcine Matar, MD  Fluticasone-Salmeterol (ADVAIR) 250-50 MCG/DOSE AEPB Inhale 1 puff into the lungs 2 (two) times daily. Patient not taking: Reported on 05/08/2022 03/31/22   [provider]  nitrofurantoin, macrocrystal-monohydrate, (MACROBID) 100 MG capsule Take 1 capsule (100 mg total) by mouth 2 (two) times daily. Patient not taking: Reported on 05/08/2022 04/29/22   Sherian Maroon A, PA    ROS: Neg HEENT Neg resp Neg cardiac Neg GI Neg GU Neg MS Neg psych Neg neuro  Objective:   Vitals:   05/08/22 1529 05/08/22 1540  BP: (!) 163/103 140/70  Pulse: 64   SpO2: 94%   Weight: 235 lb 12.8 oz  (107 kg)    Exam General appearance : Awake, alert, not in any distress. Speech Clear. Not toxic looking HEENT: Atraumatic and Normocephalic Neck: Supple, no JVD. No cervical lymphadenopathy.  Chest: Good air entry bilaterally, CTAB.  No rales/rhonchi/wheezing CVS: S1 S2 regular, no murmurs.  Extremities: B/L Lower Ext shows no edema, both legs are warm to touch Neurology: Awake alert, and oriented X 3, CN II-XII intact, Non focal Skin: No Rash  Data Review Lab Results  Component Value Date   HGBA1C 7.4 (A) 05/08/2022   HGBA1C 9.1 (A) 01/22/2022   HGBA1C 8.5 (A) 09/11/2021    Assessment & Plan   1. 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(HCC) Continue current regimen and see endocrine as planned- - Glucose (CBG) - HgB A1c  2. Hyperlipidemia, unspecified hyperlipidemia type Continue current regimen  3. Acute cystitis without hematuria Clinically resolved  4. Encounter for examination following treatment at hospital improved  5. Other specified disorders of kidney and ureter - MR Abdomen W Wo Contrast; Future  6. Renal mass See #5    Return in about 4 months (around 09/07/2022) for PCP for chronic conditions.  The patient was given clear instructions to go to ER or return to medical center if symptoms don't improve, worsen or new problems develop. The patient verbalized understanding. The patient was told to call to get lab results if they haven't heard anything in the next week.      Georgian Co, PA-C [facility] [place], [place] [phone]   05/08/2022, 3:50 PM

## 2022-05-21 ENCOUNTER — Encounter: Payer: Self-pay | Admitting: "Endocrinology

## 2022-05-21 ENCOUNTER — Ambulatory Visit (INDEPENDENT_AMBULATORY_CARE_PROVIDER_SITE_OTHER): Payer: 59 | Admitting: "Endocrinology

## 2022-05-21 VITALS — BP 134/82 | HR 68 | Ht 65.5 in | Wt 230.6 lb

## 2022-05-21 DIAGNOSIS — E782 Mixed hyperlipidemia: Secondary | ICD-10-CM

## 2022-05-21 DIAGNOSIS — Z794 Long term (current) use of insulin: Secondary | ICD-10-CM

## 2022-05-21 DIAGNOSIS — Z6837 Body mass index (BMI) 37.0-37.9, adult: Secondary | ICD-10-CM

## 2022-05-21 DIAGNOSIS — E1159 Type 2 diabetes mellitus with other circulatory complications: Secondary | ICD-10-CM

## 2022-05-21 DIAGNOSIS — I10 Essential (primary) hypertension: Secondary | ICD-10-CM | POA: Diagnosis not present

## 2022-05-21 DIAGNOSIS — E1142 Type 2 diabetes mellitus with diabetic polyneuropathy: Secondary | ICD-10-CM

## 2022-05-21 NOTE — Patient Instructions (Signed)

## 2022-05-21 NOTE — Progress Notes (Signed)
05/21/2022, 5:40 PM  Endocrinology follow-up note   Subjective:    Patient ID: Kelly Lang, female    DOB: November 22, 1961.  Kelly Lang is being seen in follow-up after she was seen in consultation for management of currently uncontrolled symptomatic diabetes requested by  Marcine Matar, MD.   Past Medical History:  Diagnosis Date   Arthritis    "all over" (06/30/2017)   Chicken pox    Dyslipidemia    Dysrhythmia ablation 2018   a-fib   GERD (gastroesophageal reflux disease)    H/O: hypothyroidism    a. thyroid biopsy 1996 with synthroid. Stopped taking rx and was loss to follow up b. normal thyroid function 10/20/13   High cholesterol    Hx of cardiovascular stress test    ETT/Lexiscan Myoview (12/2013):  No ischemia; not gated; low risk.   Hypertension    Obesity    Persistent atrial fibrillation (HCC)    a diagnosed 10/25/2013   Psoriasis    on Skyrizi   Seasonal allergies    Stroke [facility]) 2016   "some speech problems since" (06/30/2017)   Tachycardia induced cardiomyopathy (HCC)    a. 2/2 Afib with RVR for unknown duration (EF: 40-45%)   Thyroid disease    Type II diabetes mellitus (HCC)    a. hg A1c 10, newly diagnosed (10/26/13)    Past Surgical History:  Procedure Laterality Date   ATRIAL FIBRILLATION ABLATION  06/30/2017   ATRIAL FIBRILLATION ABLATION N/A 06/30/2017   Procedure: Atrial Fibrillation Ablation;  Surgeon: Hillis Range, MD;  Location: [facility];  Service: Cardiovascular;  Laterality: N/A;   BICEPT TENODESIS Right 05/24/2020   Procedure: BICEPS TENODESIS;  Surgeon: Cammy Copa, MD;  Location: [facility];  Service: Orthopedics;  Laterality: Right;   BIOPSY THYROID  1996   CARDIOVERSION N/A 12/07/2013   Procedure: CARDIOVERSION;  Surgeon: Everette Rank, MD;  Location: [facility] ENDOSCOPY;  Service: Cardiovascular;  Laterality: N/A;    CARDIOVERSION N/A 03/10/2016   Procedure: CARDIOVERSION;  Surgeon: Vesta Mixer, MD;  Location: [facility] ENDOSCOPY;  Service: Cardiovascular;  Laterality: N/A;   ESOPHAGOGASTRODUODENOSCOPY N/A 12/07/2017   Procedure: ESOPHAGOGASTRODUODENOSCOPY (EGD);  Surgeon: Hilarie Fredrickson, MD;  Location: [facility] ENDOSCOPY;  Service: Endoscopy;  Laterality: N/A;   EYE MUSCLE SURGERY Right ~ 1966   SHOULDER ARTHROSCOPY WITH ROTATOR CUFF REPAIR Right 05/24/2020   Procedure: right shoulder arthroscopy, rotator cuff tear repair biceps tenodesis;  Surgeon: Cammy Copa, MD;  Location: [facility];  Service: Orthopedics;  Laterality: Right;   TEE WITHOUT CARDIOVERSION N/A 06/29/2017   Procedure: TRANSESOPHAGEAL ECHOCARDIOGRAM (TEE);  Surgeon: Pricilla Riffle, MD;  Location: [facility] ENDOSCOPY;  Service: Cardiovascular;  Laterality: N/A;   TONSILLECTOMY  1971    Social History   Socioeconomic History   Marital status: Divorced    Spouse name: Not on file   Number of children: 0   Years of education: 14   Highest education level: Not on file  Occupational History   Occupation: Designer, television/film set  Tobacco Use   Smoking status: Former    Packs/day: 1.00  Years: 22.00    Total pack years: 22.00    Types: Cigarettes    Quit date: 12/01/2010    Years since quitting: 11.4   Smokeless tobacco: Never  Vaping Use   Vaping Use: Never used  Substance and Sexual Activity   Alcohol use: Yes    Comment: social   Drug use: No   Sexual activity: Not Currently  Other Topics Concern   Not on file  Social History Narrative   Moved to [place] from [place] in 2002.     Fun: shop, sew, decorate   Denies any beliefs effecting healthcare   Social Determinants of Health   Financial Resource Strain: Not on file  Food Insecurity: Not on file  Transportation Needs: Not on file  Physical Activity: Not on file  Stress: Not on file  Social Connections: Not on file    Family History  Adopted: Yes  Problem  Relation Age of Onset   Hypertension Mother    Hyperlipidemia Mother     Outpatient Encounter Medications as of 05/21/2022  Medication Sig   atorvastatin (LIPITOR) 40 MG tablet Take 1 tablet (40 mg total) by mouth daily.   Continuous Blood Gluc Receiver (DEXCOM G7 RECEIVER) DEVI Use to monitor BG continuously   Continuous Blood Gluc Sensor (DEXCOM G7 SENSOR) MISC Change sensor every 10 days   diclofenac Sodium (VOLTAREN) 1 % GEL Apply 2 g topically 4 (four) times daily.   DULoxetine (CYMBALTA) 30 MG capsule Take 1 capsule (30 mg total) by mouth daily.   Fluticasone-Salmeterol (ADVAIR) 250-50 MCG/DOSE AEPB Inhale 1 puff into the lungs 2 (two) times daily. (Patient not taking: Reported on 05/08/2022)   glipiZIDE (GLUCOTROL XL) 5 MG 24 hr tablet Take 1 tablet (5 mg total) by mouth daily with breakfast.   lisinopril (ZESTRIL) 10 MG tablet TAKE 1 TABLET BY MOUTH ONCE DAILY . APPOINTMENT REQUIRED FOR FUTURE REFILLS   metFORMIN (GLUCOPHAGE) 1000 MG tablet Take 1 tablet (1,000 mg total) by mouth 2 (two) times daily with a meal.   metoCLOPramide (REGLAN) 10 MG tablet Take 1 tablet (10 mg total) by mouth every 6 (six) hours.   metoprolol tartrate (LOPRESSOR) 25 MG tablet Take 1.5 tablets (37.5 mg total) by mouth 2 (two) times daily.   mupirocin ointment (BACTROBAN) 2 % Apply 1 application topically daily.   omeprazole (PRILOSEC) 20 MG capsule Take 1 capsule (20 mg total) by mouth daily.   ONETOUCH DELICA LANCETS 33G MISC Use as instructed to check blood sugar up to 3 times daily.   ONETOUCH VERIO test strip USE STRIPS TO CHECK GLUCOSE UP TO THREE TIMES DAILY AS DIRECTED   rivaroxaban (XARELTO) 20 MG TABS tablet Take 1 tablet (20 mg total) by mouth daily with supper.   Semaglutide, 2 MG/DOSE, (OZEMPIC, 2 MG/DOSE,) 8 MG/3ML SOPN INJECT 2MG  SUBCUTANEOUSLY ONCE A WEEK   SEMGLEE, YFGN, 100 UNIT/ML Pen INJECT 80 UNITS SUBCUTANEOUSLY  AT BEDTIME   SKYRIZI, 150 MG DOSE, 75 MG/0.83ML PSKT Inject 150 mg as  directed See admin instructions. 4x's per year   TRUEPLUS PEN NEEDLES 32G X 4 MM MISC USE AS DIRECTED AT BEDTIME.   No facility-administered encounter medications on file as of 05/21/2022.    ALLERGIES: Allergies  Allergen Reactions   Jardiance [Empagliflozin] Other (See Comments)    Yeast infections   Codeine Itching and Rash    VACCINATION STATUS: Immunization History  Administered Date(s) Administered   Influenza,inj,Quad PF,6+ Mos 11/19/2016, 09/07/2017, 08/09/2018, 08/16/2020, 08/23/2021   Moderna  Sars-Covid-2 Vaccination 01/04/2020, 02/01/2020, 10/12/2020, 06/09/2021   Pneumococcal Polysaccharide-23 06/16/2016   Tdap 06/16/2016    Diabetes She presents for her follow-up diabetic visit. She has type 2 diabetes mellitus. Onset time: She was diagnosed at approximate age of 50 years. Her disease course has been improving. There are no hypoglycemic associated symptoms. Pertinent negatives for hypoglycemia include no confusion, headaches, pallor or seizures. Pertinent negatives for diabetes include no chest pain, no fatigue, no polydipsia, no polyphagia and no polyuria. There are no hypoglycemic complications. Symptoms are improving. Diabetic complications include a CVA. Risk factors for coronary artery disease include dyslipidemia, diabetes mellitus, obesity, hypertension, post-menopausal, sedentary lifestyle and tobacco exposure. Current diabetic treatment includes insulin injections (She is currently on Semglee 80 units nightly, Ozempic 2 mg weekly.  Glipizide 5 mg daily.  Metformin 1000 mg p.o. twice daily.). Her weight is fluctuating minimally. She is following a generally unhealthy diet. When asked about meal planning, she reported none. She has had a previous visit with a dietitian. She participates in exercise intermittently. Her home blood glucose trend is decreasing steadily. Her breakfast blood glucose range is generally 140-180 mg/dl. Her lunch blood glucose range is generally  140-180 mg/dl. Her dinner blood glucose range is generally 140-180 mg/dl. Her bedtime blood glucose range is generally 140-180 mg/dl. Her overall blood glucose range is 140-180 mg/dl. Harriett Sine presents with continued improvement in her glycemic profile.  Her Dexcom CGM shows average glucose of 170 for the last 14 days.  90 Soliqua CGM shows 61% time range, 33% level 1 hyperglycemia, 5% level 2 hyperglycemia.  No significant hypoglycemia.  Her point-of-care A1c is 7.4% improving from 9.1%. ) An ACE inhibitor/angiotensin II receptor blocker is being taken. Eye exam is current.  Hypertension This is a chronic problem. The current episode started more than 1 year ago. The problem is controlled. Pertinent negatives include no chest pain, headaches, palpitations or shortness of breath. Risk factors for coronary artery disease include diabetes mellitus, dyslipidemia, obesity, sedentary lifestyle, smoking/tobacco exposure and post-menopausal state. Past treatments include ACE inhibitors and beta blockers. Hypertensive end-organ damage includes CVA.  Hyperlipidemia This is a chronic problem. The current episode started more than 1 year ago. The problem is uncontrolled. Exacerbating diseases include diabetes and obesity. Pertinent negatives include no chest pain, myalgias or shortness of breath. Risk factors for coronary artery disease include dyslipidemia, diabetes mellitus, hypertension, obesity, a sedentary lifestyle and post-menopausal.     Review of Systems  Constitutional:  Negative for chills, fatigue, fever and unexpected weight change.  HENT:  Negative for trouble swallowing and voice change.   Eyes:  Negative for visual disturbance.  Respiratory:  Negative for cough, shortness of breath and wheezing.   Cardiovascular:  Negative for chest pain, palpitations and leg swelling.  Gastrointestinal:  Negative for diarrhea, nausea and vomiting.  Endocrine: Negative for cold intolerance, heat intolerance,  polydipsia, polyphagia and polyuria.  Musculoskeletal:  Negative for arthralgias and myalgias.  Skin:  Negative for color change, pallor, rash and wound.  Neurological:  Negative for seizures and headaches.  Psychiatric/Behavioral:  Negative for confusion and suicidal ideas.     Objective:       05/21/2022   10:29 AM 05/08/2022    3:40 PM 05/08/2022    3:29 PM  Vitals with BMI  Height 5' 5.5"    Weight 230 lbs 10 oz  235 lbs 13 oz  BMI 37.78    Systolic 134 140 295  Diastolic 82 70 103  Pulse 68  64    BP 134/82   Pulse 68   Ht 5' 5.5" (1.664 m)   Wt 230 lb 9.6 oz (104.6 kg)   LMP 04/15/2015 (LMP Unknown) Comment: last in may  BMI 37.79 kg/m   Wt Readings from Last 3 Encounters:  05/21/22 230 lb 9.6 oz (104.6 kg)  05/08/22 235 lb 12.8 oz (107 kg)  04/02/22 232 lb (105.2 kg)     CMP     Component Value Date/Time   NA 137 04/29/2022 1539   NA 139 01/21/2022 0935   K 3.8 04/29/2022 1539   CL 103 04/29/2022 1539   CO2 21 (L) 04/29/2022 1539   GLUCOSE 79 04/29/2022 1539   BUN 12 04/29/2022 1539   BUN 13 01/21/2022 0935   CREATININE 0.67 04/29/2022 1539   CREATININE 0.90 06/16/2016 1128   CALCIUM 8.9 04/29/2022 1539   PROT 7.4 04/29/2022 1539   PROT 7.3 01/21/2022 0935   ALBUMIN 3.8 04/29/2022 1539   ALBUMIN 4.2 01/21/2022 0935   AST 14 (L) 04/29/2022 1539   ALT 24 04/29/2022 1539   ALKPHOS 49 04/29/2022 1539   BILITOT 0.8 04/29/2022 1539   BILITOT 0.4 01/21/2022 0935   GFRNONAA >60 04/29/2022 1539   GFRNONAA 73 06/16/2016 1128   GFRAA 118 12/25/2020 0802   GFRAA 84 06/16/2016 1128     Diabetic Labs (most recent): Lab Results  Component Value Date   HGBA1C 7.4 (A) 05/08/2022   HGBA1C 9.1 (A) 01/22/2022   HGBA1C 8.5 (A) 09/11/2021   MICROALBUR 150 09/24/2020   MICROALBUR 6.2 12/26/2015   MICROALBUR 2.9 (H) 11/13/2014     Lipid Panel ( most recent) Lipid Panel     Component Value Date/Time   CHOL 149 01/21/2022 0935   TRIG 202 (H) 01/21/2022 0935    HDL 37 (L) 01/21/2022 0935   CHOLHDL 4.0 01/21/2022 0935   CHOLHDL 5.2 (H) 11/19/2016 1103   VLDL 59 (H) 11/19/2016 1103   LDLCALC 78 01/21/2022 0935   LDLDIRECT 114.0 02/13/2015 0750   LABVLDL 34 01/21/2022 0935      Lab Results  Component Value Date   TSH 0.597 12/25/2020   TSH 1.01 09/16/2019   TSH 0.718 12/26/2015   TSH 0.950 09/15/2015   TSH 0.95 11/13/2014   TSH 1.020 10/25/2013   FREET4 0.99 12/25/2020   FREET4 0.98 12/26/2015   FREET4 0.98 10/25/2013      Assessment & Plan:   1. DM type 2 causing vascular disease (HCC) - Kelly Lang has currently uncontrolled symptomatic type 2 DM since  61 years of age.  Inara presents with continued improvement in her glycemic profile.  Her Dexcom CGM shows average glucose of 170 for the last 14 days.  90 Soliqua CGM shows 61% time range, 33% level 1 hyperglycemia, 5% level 2 hyperglycemia.  No significant hypoglycemia.  Her point-of-care A1c is 7.4% improving from 9.1%.   - I had a long discussion with her about the progressive nature of diabetes and the pathology behind its complications. -her diabetes is complicated by CVA, peripheral neuropathy and she remains at a high risk for more acute and chronic complications which include CAD, CVA, CKD, retinopathy, and neuropathy. These are all discussed in detail with her.  - I have counseled her on diet  and weight management  by adopting a carbohydrate restricted/protein rich diet. Patient is encouraged to switch to  unprocessed or minimally processed     complex starch and increased protein intake (animal or plant source),  fruits, and vegetables. -  she is advised to stick to a routine mealtimes to eat 3 meals  a day and avoid unnecessary snacks ( to snack only to correct hypoglycemia).   - she acknowledges that there is a room for improvement in her food and drink choices. - Suggestion is made for her to avoid simple carbohydrates  from her diet including Cakes, Sweet Desserts,  Ice Cream, Soda (diet and regular), Sweet Tea, Candies, Chips, Cookies, Store Bought Juices, Alcohol , Artificial Sweeteners,  Coffee Creamer, and "Sugar-free" Products, Lemonade. This will help patient to have more stable blood glucose profile and potentially avoid unintended weight gain.  The following Lifestyle Medicine recommendations according to American College of Lifestyle Medicine  [facility]) were discussed and and offered to patient and she  agrees to start the journey:  A. Whole Foods, Plant-Based Nutrition comprising of fruits and vegetables, plant-based proteins, whole-grain carbohydrates was discussed in detail with the patient.   A list for source of those nutrients were also provided to the patient.  Patient will use only water or unsweetened tea for hydration. B.  The need to stay away from risky substances including alcohol, smoking; obtaining 7 to 9 hours of restorative sleep, at least 150 minutes of moderate intensity exercise weekly, the importance of healthy social connections,  and stress management techniques were discussed. C.  A full color page of  Calorie density of various food groups per pound showing examples of each food groups was provided to the patient.   - she reports that she has had enough encounters with dietitian in the past.    - I have approached her with the following individualized plan to manage  her diabetes and patient agrees:   -In light of her presentation with improving glycemic profile, she will not need prandial insulin for now.   -She is encouraged to stay on course for lifestyle medicine. -She is approached to continue monitoring blood glucose continuously using her CGM.  She is advised to continue Semglee 80 units nightly, continue metformin 1000 mg p.o. twice daily, glipizide 5 mg XL p.o. daily at breakfast.  She is also advised to continue Ozempic 2 mg subcutaneously weekly.   - she is encouraged to call clinic for blood glucose levels less than 70  or above 200 mg /dl.   - Specific targets for  A1c;  LDL, HDL,  and Triglycerides were discussed with the patient.  2) Blood Pressure /Hypertension:  -She presents with a much better blood pressure.    She has a chance to minimize her blood pressure medications on subsequent visits.  The above recommended plant dominant whole food approach will address hypertension, hyperlipidemia as well.   she is advised to continue her current medications including lisinopril 10 mg p.o. daily, metoprolol 25 mg p.o. twice daily.   3) Lipids/Hyperlipidemia:   Review of her recent lipid panel showed improved LDL at 78, overall improving from 114 . She is benefiting from statin.  Advised to continue atorvastatin 40 mg p.o. nightly.  Side effects and precautions discussed with her.     4)  Weight/Diet:  Body mass index is 37.79 kg/m.  -   clearly complicating her diabetes care.   she is  a candidate for weight loss. I discussed with her the fact that loss of 5 - 10% of her  current body weight will have the most impact on her diabetes management.  Exercise, and detailed carbohydrates information provided  -  detailed  on discharge instructions.   5) Chronic Care/Health Maintenance:  -she  is on ACEI/ARB and Statin medications and  is encouraged to initiate and continue to follow up with Ophthalmology, Dentist,  Podiatrist at least yearly or according to recommendations, and advised to   stay away from smoking. I have recommended yearly flu vaccine and pneumonia vaccine at least every 5 years; moderate intensity exercise for up to 150 minutes weekly; and  sleep for at least 7 hours a day.  Her screening ABI was negative for PAD in January 2022, the study will be repeated in January 2027, or sooner if needed.  - she is  advised to maintain close follow up with Marcine Matar, MD for primary care needs, as well as her other providers for optimal and coordinated care.   I spent 41 minutes in the care of the  patient today including review of labs from CMP, Lipids, Thyroid Function, Hematology (current and previous including abstractions from other facilities); face-to-face time discussing  her blood glucose readings/logs, discussing hypoglycemia and hyperglycemia episodes and symptoms, medications doses, her options of short and long term treatment based on the latest standards of care / guidelines;  discussion about incorporating lifestyle medicine;  and documenting the encounter.    Please refer to Patient Instructions for Blood Glucose Monitoring and Insulin/Medications Dosing Guide"  in media tab for additional information. Please  also refer to " Patient Self Inventory" in the Media  tab for reviewed elements of pertinent patient history.  Kelly Lang participated in the discussions, expressed understanding, and voiced agreement with the above plans.  All questions were answered to her satisfaction. she is encouraged to contact clinic should she have any questions or concerns prior to her return visit.   Follow up plan: - Return in about 4 months (around 09/20/2022) for Bring Meter/CGM Device/Logs- A1c in Office.  Marquis Lunch, MD [facility] [address] [place], [place] [address] Phone: [phone]  Fax: [phone]    05/21/2022, 5:40 PM  This note was partially dictated with voice recognition software. Similar sounding words can be transcribed inadequately or may not  be corrected upon review.

## 2022-05-26 ENCOUNTER — Other Ambulatory Visit: Payer: Self-pay | Admitting: "Endocrinology

## 2022-05-27 ENCOUNTER — Other Ambulatory Visit: Payer: Self-pay | Admitting: "Endocrinology

## 2022-05-27 DIAGNOSIS — E1142 Type 2 diabetes mellitus with diabetic polyneuropathy: Secondary | ICD-10-CM

## 2022-05-28 ENCOUNTER — Other Ambulatory Visit: Payer: Self-pay | Admitting: Pharmacist

## 2022-05-28 MED ORDER — OMEPRAZOLE 20 MG PO CPDR: 20.0000 mg | DELAYED_RELEASE_CAPSULE | Freq: Every day | ORAL | 0 refills | Status: DC

## 2022-06-01 ENCOUNTER — Other Ambulatory Visit: Payer: Self-pay | Admitting: Nurse Practitioner

## 2022-06-01 ENCOUNTER — Other Ambulatory Visit: Payer: Self-pay | Admitting: "Endocrinology

## 2022-06-01 DIAGNOSIS — E1142 Type 2 diabetes mellitus with diabetic polyneuropathy: Secondary | ICD-10-CM

## 2022-06-01 DIAGNOSIS — M2550 Pain in unspecified joint: Secondary | ICD-10-CM

## 2022-06-09 ENCOUNTER — Telehealth: Payer: Self-pay | Admitting: "Endocrinology

## 2022-06-09 DIAGNOSIS — E1169 Type 2 diabetes mellitus with other specified complication: Secondary | ICD-10-CM

## 2022-06-09 MED ORDER — METFORMIN HCL 1000 MG PO TABS: 1000.0000 mg | ORAL_TABLET | Freq: Two times a day (BID) | ORAL | 0 refills | Status: DC

## 2022-06-09 NOTE — Telephone Encounter (Signed)
Rx sent. Left a message requesting pt return call to the office.

## 2022-06-09 NOTE — Telephone Encounter (Signed)
Pt called and states her PCP has been filling her Metformin but she would like for Dr. Fransico Him to take over filling it and would like to know if he can send that in.  [facility] [address], [place] - [address] Phone:  [phone]  Fax:  [phone]

## 2022-06-09 NOTE — Telephone Encounter (Signed)
Informed pt .

## 2022-06-12 ENCOUNTER — Telehealth (HOSPITAL_BASED_OUTPATIENT_CLINIC_OR_DEPARTMENT_OTHER): Payer: Self-pay

## 2022-06-12 ENCOUNTER — Other Ambulatory Visit: Payer: Self-pay | Admitting: Internal Medicine

## 2022-06-12 DIAGNOSIS — I10 Essential (primary) hypertension: Secondary | ICD-10-CM

## 2022-06-21 ENCOUNTER — Emergency Department (HOSPITAL_BASED_OUTPATIENT_CLINIC_OR_DEPARTMENT_OTHER): Payer: 59 | Admitting: Radiology

## 2022-06-21 ENCOUNTER — Emergency Department (HOSPITAL_BASED_OUTPATIENT_CLINIC_OR_DEPARTMENT_OTHER): Payer: 59

## 2022-06-21 ENCOUNTER — Other Ambulatory Visit: Payer: Self-pay

## 2022-06-21 ENCOUNTER — Inpatient Hospital Stay (HOSPITAL_BASED_OUTPATIENT_CLINIC_OR_DEPARTMENT_OTHER)
Admission: EM | Admit: 2022-06-21 | Discharge: 2022-06-25 | DRG: 250 | Disposition: A | Discharge status: Home / Self Care | Payer: 59 | Attending: Internal Medicine | Admitting: Internal Medicine

## 2022-06-21 ENCOUNTER — Encounter (HOSPITAL_BASED_OUTPATIENT_CLINIC_OR_DEPARTMENT_OTHER): Payer: Self-pay | Admitting: Emergency Medicine

## 2022-06-21 DIAGNOSIS — I43 Cardiomyopathy in diseases classified elsewhere: Secondary | ICD-10-CM | POA: Diagnosis present

## 2022-06-21 DIAGNOSIS — I63412 Cerebral infarction due to embolism of left middle cerebral artery: Secondary | ICD-10-CM | POA: Diagnosis present

## 2022-06-21 DIAGNOSIS — I4891 Unspecified atrial fibrillation: Secondary | ICD-10-CM | POA: Diagnosis present

## 2022-06-21 DIAGNOSIS — R778 Other specified abnormalities of plasma proteins: Secondary | ICD-10-CM

## 2022-06-21 DIAGNOSIS — Z888 Allergy status to other drugs, medicaments and biological substances status: Secondary | ICD-10-CM

## 2022-06-21 DIAGNOSIS — I110 Hypertensive heart disease with heart failure: Principal | ICD-10-CM | POA: Diagnosis present

## 2022-06-21 DIAGNOSIS — Z8673 Personal history of transient ischemic attack (TIA), and cerebral infarction without residual deficits: Secondary | ICD-10-CM

## 2022-06-21 DIAGNOSIS — L409 Psoriasis, unspecified: Secondary | ICD-10-CM | POA: Diagnosis present

## 2022-06-21 DIAGNOSIS — I428 Other cardiomyopathies: Secondary | ICD-10-CM | POA: Diagnosis present

## 2022-06-21 DIAGNOSIS — I10 Essential (primary) hypertension: Secondary | ICD-10-CM | POA: Diagnosis present

## 2022-06-21 DIAGNOSIS — Z87891 Personal history of nicotine dependence: Secondary | ICD-10-CM

## 2022-06-21 DIAGNOSIS — I214 Non-ST elevation (NSTEMI) myocardial infarction: Secondary | ICD-10-CM | POA: Diagnosis present

## 2022-06-21 DIAGNOSIS — R079 Chest pain, unspecified: Secondary | ICD-10-CM | POA: Diagnosis present

## 2022-06-21 DIAGNOSIS — E1169 Type 2 diabetes mellitus with other specified complication: Secondary | ICD-10-CM | POA: Diagnosis present

## 2022-06-21 DIAGNOSIS — E041 Nontoxic single thyroid nodule: Secondary | ICD-10-CM | POA: Diagnosis present

## 2022-06-21 DIAGNOSIS — Z6837 Body mass index (BMI) 37.0-37.9, adult: Secondary | ICD-10-CM

## 2022-06-21 DIAGNOSIS — E7800 Pure hypercholesterolemia, unspecified: Secondary | ICD-10-CM | POA: Diagnosis present

## 2022-06-21 DIAGNOSIS — K3184 Gastroparesis: Secondary | ICD-10-CM | POA: Diagnosis present

## 2022-06-21 DIAGNOSIS — Z8249 Family history of ischemic heart disease and other diseases of the circulatory system: Secondary | ICD-10-CM

## 2022-06-21 DIAGNOSIS — Z7901 Long term (current) use of anticoagulants: Secondary | ICD-10-CM

## 2022-06-21 DIAGNOSIS — I25110 Atherosclerotic heart disease of native coronary artery with unstable angina pectoris: Secondary | ICD-10-CM | POA: Diagnosis present

## 2022-06-21 DIAGNOSIS — E785 Hyperlipidemia, unspecified: Secondary | ICD-10-CM | POA: Diagnosis present

## 2022-06-21 DIAGNOSIS — Z7902 Long term (current) use of antithrombotics/antiplatelets: Secondary | ICD-10-CM

## 2022-06-21 DIAGNOSIS — E1159 Type 2 diabetes mellitus with other circulatory complications: Secondary | ICD-10-CM | POA: Diagnosis present

## 2022-06-21 DIAGNOSIS — I5023 Acute on chronic systolic (congestive) heart failure: Secondary | ICD-10-CM | POA: Diagnosis present

## 2022-06-21 DIAGNOSIS — R112 Nausea with vomiting, unspecified: Secondary | ICD-10-CM | POA: Diagnosis present

## 2022-06-21 DIAGNOSIS — E1143 Type 2 diabetes mellitus with diabetic autonomic (poly)neuropathy: Secondary | ICD-10-CM | POA: Diagnosis present

## 2022-06-21 DIAGNOSIS — G8929 Other chronic pain: Secondary | ICD-10-CM | POA: Diagnosis present

## 2022-06-21 DIAGNOSIS — Z8349 Family history of other endocrine, nutritional and metabolic diseases: Secondary | ICD-10-CM

## 2022-06-21 DIAGNOSIS — Z7984 Long term (current) use of oral hypoglycemic drugs: Secondary | ICD-10-CM

## 2022-06-21 DIAGNOSIS — I4819 Other persistent atrial fibrillation: Secondary | ICD-10-CM | POA: Diagnosis present

## 2022-06-21 DIAGNOSIS — R0602 Shortness of breath: Secondary | ICD-10-CM | POA: Diagnosis not present

## 2022-06-21 DIAGNOSIS — K219 Gastro-esophageal reflux disease without esophagitis: Secondary | ICD-10-CM | POA: Diagnosis present

## 2022-06-21 DIAGNOSIS — E119 Type 2 diabetes mellitus without complications: Secondary | ICD-10-CM

## 2022-06-21 DIAGNOSIS — Z79899 Other long term (current) drug therapy: Secondary | ICD-10-CM

## 2022-06-21 DIAGNOSIS — I493 Ventricular premature depolarization: Secondary | ICD-10-CM | POA: Diagnosis present

## 2022-06-21 DIAGNOSIS — Z885 Allergy status to narcotic agent status: Secondary | ICD-10-CM

## 2022-06-21 DIAGNOSIS — I509 Heart failure, unspecified: Principal | ICD-10-CM

## 2022-06-21 DIAGNOSIS — E669 Obesity, unspecified: Secondary | ICD-10-CM | POA: Diagnosis present

## 2022-06-21 LAB — CBC
HCT: 41.1 % (ref 36.0–46.0)
Hemoglobin: 14 g/dL (ref 12.0–15.0)
MCH: 32.5 pg (ref 26.0–34.0)
MCHC: 34.1 g/dL (ref 30.0–36.0)
MCV: 95.4 fL (ref 80.0–100.0)
Platelets: 258 10*3/uL (ref 150–400)
RBC: 4.31 MIL/uL (ref 3.87–5.11)
RDW: 12.7 % (ref 11.5–15.5)
WBC: 9.2 10*3/uL (ref 4.0–10.5)
nRBC: 0 % (ref 0.0–0.2)

## 2022-06-21 LAB — TROPONIN I (HIGH SENSITIVITY)
Troponin I (High Sensitivity): 106 ng/L (ref <18)
Troponin I (High Sensitivity): 80 ng/L — ABNORMAL HIGH (ref <18)

## 2022-06-21 LAB — BASIC METABOLIC PANEL
Anion gap: 10 (ref 5–15)
BUN: 12 mg/dL (ref 6–20)
CO2: 24 mmol/L (ref 22–32)
Calcium: 9.3 mg/dL (ref 8.9–10.3)
Chloride: 103 mmol/L (ref 98–111)
Creatinine, Ser: 0.73 mg/dL (ref 0.44–1.00)
GFR, Estimated: >60 mL/min (ref >60)
Glucose, Bld: 218 mg/dL — ABNORMAL HIGH (ref 70–99)
Potassium: 4.5 mmol/L (ref 3.5–5.1)
Sodium: 137 mmol/L (ref 135–145)

## 2022-06-21 LAB — BRAIN NATRIURETIC PEPTIDE: B Natriuretic Peptide: 294.6 pg/mL — ABNORMAL HIGH (ref 0.0–100.0)

## 2022-06-21 MED ORDER — FUROSEMIDE 10 MG/ML IJ SOLN
60.0000 mg | Freq: Once | INTRAMUSCULAR | Status: AC
  Administered 2022-06-21: 60 mg via INTRAVENOUS
  Filled 2022-06-21: qty 6

## 2022-06-21 MED ORDER — IOHEXOL 350 MG/ML SOLN
100.0000 mL | Freq: Once | INTRAVENOUS | Status: AC | PRN
  Administered 2022-06-21: 100 mL via INTRAVENOUS

## 2022-06-21 MED ORDER — NITROGLYCERIN 0.4 MG SL SUBL
0.4000 mg | SUBLINGUAL_TABLET | SUBLINGUAL | Status: DC | PRN
  Administered 2022-06-21: 0.4 mg via SUBLINGUAL
  Filled 2022-06-21: qty 1

## 2022-06-21 NOTE — ED Provider Notes (Signed)
[facility] EMERGENCY DEPT Provider Note   CSN: [id number] Arrival date & time: 06/21/22  1606     History  Chief Complaint  Patient presents with   Shortness of Breath    Kelly Lang is a 61 y.o. female.  Patient is a 61 year old female who presents with chest pain and shortness of breath.  Per chart review, she has a history of diabetes, hypertension, hyperlipidemia, atrial fibrillation on Xarelto status post ablation, tachycardia induced cardiomyopathy, strokes.  She reports that last night she had an episode of shortness of breath.  She felt like it resolved.  Today she started having some intense pain which feels like a heavy pressure in the center of her chest and over on the left side.  It radiates between her shoulder blades.  She has an intense pain between her shoulder blades.  She has associated shortness of breath which is worse when she lies flat.  No increased leg swelling.  No cough or cold symptoms.  No fevers.  No history of similar symptoms in the past.       Home Medications Prior to Admission medications   Medication Sig Start Date End Date Taking? Authorizing Provider  atorvastatin (LIPITOR) 40 MG tablet Take 1 tablet (40 mg total) by mouth daily. 02/25/22 05/26/22  Claiborne Rigg, NP  Continuous Blood Gluc Receiver (DEXCOM G7 RECEIVER) DEVI Use to monitor BG continuously 03/12/22   Nida, Denman George, MD  Continuous Blood Gluc Sensor (DEXCOM G7 SENSOR) MISC CHANGE SENSOR EVERY 10 DAYS 05/26/22   Roma Kayser, MD  diclofenac Sodium (VOLTAREN) 1 % GEL Apply 2 g topically 4 (four) times daily. 06/25/21   Marcine Matar, MD  DULoxetine (CYMBALTA) 30 MG capsule Take 1 capsule (30 mg total) by mouth daily. 06/02/22   Marcine Matar, MD  Fluticasone-Salmeterol (ADVAIR) 250-50 MCG/DOSE AEPB Inhale 1 puff into the lungs 2 (two) times daily. Patient not taking: Reported on 05/08/2022 03/31/22   [provider]  glipiZIDE (GLUCOTROL XL)  5 MG 24 hr tablet Take 1 tablet (5 mg total) by mouth daily with breakfast. 03/12/22   Nida, Denman George, MD  lisinopril (ZESTRIL) 10 MG tablet Take 1 tablet (10 mg total) by mouth daily. 06/12/22   Marcine Matar, MD  metFORMIN (GLUCOPHAGE) 1000 MG tablet Take 1 tablet (1,000 mg total) by mouth 2 (two) times daily with a meal. 06/09/22   Nida, Denman George, MD  metoCLOPramide (REGLAN) 10 MG tablet Take 1 tablet (10 mg total) by mouth every 6 (six) hours. 04/29/22   Peter Garter, PA  metoprolol tartrate (LOPRESSOR) 25 MG tablet Take 1.5 tablets (37.5 mg total) by mouth 2 (two) times daily. 02/26/21   Nahser, [name], MD  mupirocin ointment (BACTROBAN) 2 % Apply 1 application topically daily. 07/01/21   McDonald, Rachelle Hora, DPM  omeprazole (PRILOSEC) 20 MG capsule Take 1 capsule (20 mg total) by mouth daily. 05/28/22   Hoy Register, MD  [facility] DELICA LANCETS 33G MISC Use as instructed to check blood sugar up to 3 times daily. 10/05/18   Marcine Matar, MD  ONETOUCH VERIO test strip USE STRIPS TO CHECK GLUCOSE UP TO THREE TIMES DAILY AS DIRECTED 12/14/20   Marcine Matar, MD  rivaroxaban (XARELTO) 20 MG TABS tablet Take 1 tablet (20 mg total) by mouth daily with supper. 02/26/21   Nahser, [name], MD  Semaglutide, 2 MG/DOSE, (OZEMPIC, 2 MG/DOSE,) 8 MG/3ML SOPN INJECT 2MG  SUBCUTANEOUSLY ONCE A WEEK 04/29/22  Reardon, Whitney J, NP  SEMGLEE, YFGN, 100 UNIT/ML Pen INJECT 80 UNITS SUBCUTANEOUSLY  AT BEDTIME 06/04/22   Nida, Denman George, MD  SKYRIZI, 150 MG DOSE, 75 MG/0.83ML PSKT Inject 150 mg as directed See admin instructions. 4x's per year 08/25/19   [provider]  TRUEPLUS PEN NEEDLES 32G X 4 MM MISC USE AS DIRECTED AT BEDTIME. 08/23/20   Marcine Matar, MD      Allergies    Jardiance [empagliflozin] and Codeine    Review of Systems   Review of Systems  Constitutional:  Negative for chills, diaphoresis, fatigue and fever.  HENT:  Negative for congestion, rhinorrhea  and sneezing.   Eyes: Negative.   Respiratory:  Positive for chest tightness and shortness of breath. Negative for cough.   Cardiovascular:  Negative for chest pain and leg swelling.  Gastrointestinal:  Negative for abdominal pain, blood in stool, diarrhea, nausea and vomiting.  Genitourinary:  Negative for difficulty urinating, flank pain, frequency and hematuria.  Musculoskeletal:  Positive for back pain. Negative for arthralgias.  Skin:  Negative for rash.  Neurological:  Negative for dizziness, speech difficulty, weakness, numbness and headaches.    Physical Exam Updated Vital Signs BP 135/77 (BP Location: Right Arm)   Pulse 89   Temp 97.8 F (36.6 C) (Oral)   Resp 18   Ht 5\' 5"  (1.651 m)   Wt 104.3 kg   LMP 04/15/2015 (LMP Unknown) Comment: last in may  SpO2 94%   BMI 38.27 kg/m  Physical Exam Constitutional:      Appearance: She is well-developed.  HENT:     Head: Normocephalic and atraumatic.  Eyes:     Pupils: Pupils are equal, round, and reactive to light.  Cardiovascular:     Rate and Rhythm: Normal rate and regular rhythm.     Heart sounds: Normal heart sounds.  Pulmonary:     Effort: Pulmonary effort is normal. No respiratory distress.     Breath sounds: Normal breath sounds. No wheezing or rales.  Chest:     Chest wall: No tenderness.  Abdominal:     General: Bowel sounds are normal.     Palpations: Abdomen is soft.     Tenderness: There is no abdominal tenderness. There is no guarding or rebound.  Musculoskeletal:        General: Normal range of motion.     Cervical back: Normal range of motion and neck supple.     Comments: No edema or calf tenderness  Lymphadenopathy:     Cervical: No cervical adenopathy.  Skin:    General: Skin is warm and dry.     Findings: No rash.  Neurological:     Mental Status: She is alert and oriented to person, place, and time.     ED Results / Procedures / Treatments   Labs (all labs ordered are listed, but only  abnormal results are displayed) Labs Reviewed  BASIC METABOLIC PANEL - Abnormal; Notable for the following components:      Result Value   Glucose, Bld 218 (*)    All other components within normal limits  BRAIN NATRIURETIC PEPTIDE - Abnormal; Notable for the following components:   B Natriuretic Peptide 294.6 (*)    All other components within normal limits  TROPONIN I (HIGH SENSITIVITY) - Abnormal; Notable for the following components:   Troponin I (High Sensitivity) 106 (*)    All other components within normal limits  TROPONIN I (HIGH SENSITIVITY) - Abnormal; Notable for the  following components:   Troponin I (High Sensitivity) 80 (*)    All other components within normal limits  CBC    EKG EKG Interpretation  Date/Time:  Saturday June 21 2022 16:28:43 EDT Ventricular Rate:  83 PR Interval:  180 QRS Duration: 100 QT Interval:  384 QTC Calculation: 451 R Axis:   -38 Text Interpretation: Normal sinus rhythm Left axis deviation Moderate voltage criteria for LVH, may be normal variant ( R in aVL , Cornell product ) Septal infarct (cited on or before 05-Dec-2017) Abnormal ECG similar to prior EKG Confirmed by Rolan Bucco [phone]) on 06/21/2022 5:00:30 PM  Radiology CT Angio Chest/Abd/Pel for Dissection W and/or W/WO  Result Date: 06/21/2022 CLINICAL DATA:  Chest pain or back pain with suspected aortic dissection. EXAM: CT ANGIOGRAPHY CHEST, ABDOMEN AND PELVIS TECHNIQUE: Non-contrast CT of the chest was initially obtained. Multidetector CT imaging through the chest, abdomen and pelvis was performed using the standard protocol during bolus administration of intravenous contrast. Multiplanar reconstructed images and MIPs were obtained and reviewed to evaluate the vascular anatomy. RADIATION DOSE REDUCTION: This exam was performed according to the departmental dose-optimization program which includes automated exposure control, adjustment of the mA and/or kV according to patient size  and/or use of iterative reconstruction technique. CONTRAST:  OMNIPAQUE IOHEXOL 350 MG/ML SOLN COMPARISON:  CT abdomen and pelvis 04/29/2022 FINDINGS: CTA CHEST FINDINGS Cardiovascular: Noncontrast images of the chest demonstrate a few scattered calcifications in the aorta. Moderate calcification in the coronary arteries. No evidence of intramural hematoma. Images obtained during arterial phase after contrast administration demonstrate normal caliber thoracic aorta. No aortic dissection. Great vessel origins are patent. Normal heart size. No pericardial effusions. Central pulmonary arteries are well opacified without evidence of significant pulmonary embolus. Mediastinum/Nodes: Hypodense mass with calcification in the right thyroid gland measuring 3.5 cm diameter. Esophagus is decompressed. No significant lymphadenopathy. Lungs/Pleura: Small bilateral pleural effusions. Diffuse interstitial changes in the lungs likely represent edema although chronic fibrosis could also have this appearance. Atelectasis in the lung bases. Musculoskeletal: No chest wall abnormality. No acute or significant osseous findings. Review of the MIP images confirms the above findings. CTA ABDOMEN AND PELVIS FINDINGS VASCULAR Aorta: Normal caliber aorta without aneurysm, dissection, vasculitis or significant stenosis. Celiac: Patent without evidence of aneurysm, dissection, vasculitis or significant stenosis. SMA: Patent without evidence of aneurysm, dissection, vasculitis or significant stenosis. Renals: Both renal arteries are patent without evidence of aneurysm, dissection, vasculitis, fibromuscular dysplasia or significant stenosis. IMA: Patent without evidence of aneurysm, dissection, vasculitis or significant stenosis. Inflow: Patent without evidence of aneurysm, dissection, vasculitis or significant stenosis. Veins: No obvious venous abnormality within the limitations of this arterial phase study. Review of the MIP images confirms  the above findings. NON-VASCULAR Hepatobiliary: No focal liver abnormality is seen. No gallstones, gallbladder wall thickening, or biliary dilatation. Pancreas: Unremarkable. No pancreatic ductal dilatation or surrounding inflammatory changes. Spleen: Normal in size without focal abnormality. Adrenals/Urinary Tract: Adrenal glands are unremarkable. Kidneys are normal, without renal calculi, focal lesion, or hydronephrosis. Bladder is unremarkable. Stomach/Bowel: Stomach is within normal limits. Appendix appears normal. No evidence of bowel wall thickening, distention, or inflammatory changes. Lymphatic: No significant lymphadenopathy. Reproductive: Uterus and bilateral adnexa are unremarkable. Other: No abdominal wall hernia or abnormality. No abdominopelvic ascites. Musculoskeletal: No acute or significant osseous findings. Review of the MIP images confirms the above findings. IMPRESSION: 1. No evidence of aortic aneurysm or dissection. Mild aortic atherosclerosis. 2. Small bilateral pleural effusions. Diffuse interstitial pattern to the lungs  likely representing edema. 3. No acute process demonstrated in the abdomen or pelvis. No evidence of bowel obstruction or inflammation. 4. 3.5 cm diameter right thyroid nodule with calcification. Recommend follow-up with elective thyroid US (ref: J Am Coll Radiol. 2015 Feb;12(2): 143-50). Electronically Signed   By: Burman Nieves M.D.   On: 06/21/2022 19:11   DG Chest 2 View  Result Date: 06/21/2022 CLINICAL DATA:  Chest pain, shortness of breath EXAM: CHEST - 2 VIEW COMPARISON:  09/14/2015 FINDINGS: The heart size and mediastinal contours are within normal limits. Mild, diffuse bilateral interstitial pulmonary opacity. Disc degenerative disease of the thoracic spine. IMPRESSION: Mild, diffuse bilateral interstitial pulmonary opacity, likely mild edema. No focal airspace opacity. Electronically Signed   By: Jearld Lesch M.D.   On: 06/21/2022 16:48     Procedures Procedures    Medications Ordered in ED Medications  nitroGLYCERIN (NITROSTAT) SL tablet 0.4 mg (0.4 mg Sublingual Given 06/21/22 1939)  iohexol (OMNIPAQUE) 350 MG/ML injection 100 mL (100 mLs Intravenous Contrast Given 06/21/22 1843)  furosemide (LASIX) injection 60 mg (60 mg Intravenous Given 06/21/22 1940)    ED Course/ Medical Decision Making/ A&P                           Medical Decision Making Amount and/or Complexity of Data Reviewed Labs: ordered. Radiology: ordered.  Risk Prescription drug management. Decision regarding hospitalization.   Patient is a 61 year old female who presents with chest tightness and shortness of breath.  She has an EKG that shows some slight ST depression laterally as compared to her old 1.  No ST elevation.  Her chest x-ray shows some mild pulmonary congestion.  This was interpreted by me and confirmed by radiology.  Her BNP is elevated which is consistent with a CHF exacerbation.  Her first troponin is elevated at 106.  Her second troponin is a little bit lower.  Given her pain that radiated between her shoulder blades, CT of the chest was performed which shows no aortic injury.  There is findings consistent with CHF.  There is also a thyroid nodule which I discussed with the patient and will need outpatient follow-up.  She was given IV Lasix and nitroglycerin.  She is feeling better.  Her chest tightness has resolved.  I spoke with Dr. Park Breed with cardiology.  He will put the patient on the cardiology list to see but feels the patient can be admitted to the hospitalist service.  I spoke with Dr. Antionette Char he will admit the patient for further treatment.  Final Clinical Impression(s) / ED Diagnoses Final diagnoses:  Acute on chronic congestive heart failure, unspecified heart failure type (HCC)  Elevated troponin    Rx / DC Orders ED Discharge Orders     None         Rolan Bucco, MD 06/21/22 2025

## 2022-06-21 NOTE — ED Triage Notes (Signed)
Pt here from home with c/o sob that started last night , former smoker , with some slight chest pain also

## 2022-06-21 NOTE — ED Notes (Signed)
Patient given meal and diet ginger ale.

## 2022-06-22 DIAGNOSIS — Z8249 Family history of ischemic heart disease and other diseases of the circulatory system: Secondary | ICD-10-CM | POA: Diagnosis not present

## 2022-06-22 DIAGNOSIS — E1143 Type 2 diabetes mellitus with diabetic autonomic (poly)neuropathy: Secondary | ICD-10-CM | POA: Diagnosis present

## 2022-06-22 DIAGNOSIS — I5022 Chronic systolic (congestive) heart failure: Secondary | ICD-10-CM | POA: Diagnosis not present

## 2022-06-22 DIAGNOSIS — I428 Other cardiomyopathies: Secondary | ICD-10-CM | POA: Diagnosis present

## 2022-06-22 DIAGNOSIS — E041 Nontoxic single thyroid nodule: Secondary | ICD-10-CM | POA: Diagnosis present

## 2022-06-22 DIAGNOSIS — I493 Ventricular premature depolarization: Secondary | ICD-10-CM | POA: Diagnosis present

## 2022-06-22 DIAGNOSIS — I2510 Atherosclerotic heart disease of native coronary artery without angina pectoris: Secondary | ICD-10-CM | POA: Diagnosis not present

## 2022-06-22 DIAGNOSIS — R778 Other specified abnormalities of plasma proteins: Secondary | ICD-10-CM

## 2022-06-22 DIAGNOSIS — E669 Obesity, unspecified: Secondary | ICD-10-CM | POA: Diagnosis present

## 2022-06-22 DIAGNOSIS — E1169 Type 2 diabetes mellitus with other specified complication: Secondary | ICD-10-CM | POA: Diagnosis present

## 2022-06-22 DIAGNOSIS — Z7901 Long term (current) use of anticoagulants: Secondary | ICD-10-CM | POA: Diagnosis not present

## 2022-06-22 DIAGNOSIS — E7800 Pure hypercholesterolemia, unspecified: Secondary | ICD-10-CM | POA: Diagnosis present

## 2022-06-22 DIAGNOSIS — K3184 Gastroparesis: Secondary | ICD-10-CM | POA: Diagnosis present

## 2022-06-22 DIAGNOSIS — I10 Essential (primary) hypertension: Secondary | ICD-10-CM | POA: Diagnosis not present

## 2022-06-22 DIAGNOSIS — R112 Nausea with vomiting, unspecified: Secondary | ICD-10-CM | POA: Diagnosis present

## 2022-06-22 DIAGNOSIS — G8929 Other chronic pain: Secondary | ICD-10-CM | POA: Diagnosis present

## 2022-06-22 DIAGNOSIS — I214 Non-ST elevation (NSTEMI) myocardial infarction: Secondary | ICD-10-CM | POA: Diagnosis present

## 2022-06-22 DIAGNOSIS — I5041 Acute combined systolic (congestive) and diastolic (congestive) heart failure: Secondary | ICD-10-CM | POA: Diagnosis not present

## 2022-06-22 DIAGNOSIS — I4819 Other persistent atrial fibrillation: Secondary | ICD-10-CM | POA: Diagnosis present

## 2022-06-22 DIAGNOSIS — Z79899 Other long term (current) drug therapy: Secondary | ICD-10-CM | POA: Diagnosis not present

## 2022-06-22 DIAGNOSIS — L409 Psoriasis, unspecified: Secondary | ICD-10-CM | POA: Diagnosis present

## 2022-06-22 DIAGNOSIS — I5023 Acute on chronic systolic (congestive) heart failure: Secondary | ICD-10-CM

## 2022-06-22 DIAGNOSIS — K219 Gastro-esophageal reflux disease without esophagitis: Secondary | ICD-10-CM | POA: Diagnosis present

## 2022-06-22 DIAGNOSIS — E785 Hyperlipidemia, unspecified: Secondary | ICD-10-CM | POA: Diagnosis not present

## 2022-06-22 DIAGNOSIS — Z8349 Family history of other endocrine, nutritional and metabolic diseases: Secondary | ICD-10-CM | POA: Diagnosis not present

## 2022-06-22 DIAGNOSIS — Z6837 Body mass index (BMI) 37.0-37.9, adult: Secondary | ICD-10-CM | POA: Diagnosis not present

## 2022-06-22 DIAGNOSIS — I63412 Cerebral infarction due to embolism of left middle cerebral artery: Secondary | ICD-10-CM | POA: Diagnosis not present

## 2022-06-22 DIAGNOSIS — I110 Hypertensive heart disease with heart failure: Secondary | ICD-10-CM | POA: Diagnosis present

## 2022-06-22 DIAGNOSIS — R0602 Shortness of breath: Secondary | ICD-10-CM | POA: Diagnosis present

## 2022-06-22 DIAGNOSIS — Z7902 Long term (current) use of antithrombotics/antiplatelets: Secondary | ICD-10-CM | POA: Diagnosis not present

## 2022-06-22 DIAGNOSIS — I4891 Unspecified atrial fibrillation: Secondary | ICD-10-CM | POA: Diagnosis not present

## 2022-06-22 DIAGNOSIS — I25110 Atherosclerotic heart disease of native coronary artery with unstable angina pectoris: Secondary | ICD-10-CM | POA: Diagnosis present

## 2022-06-22 DIAGNOSIS — Z7984 Long term (current) use of oral hypoglycemic drugs: Secondary | ICD-10-CM | POA: Diagnosis not present

## 2022-06-22 LAB — CBC WITH DIFFERENTIAL/PLATELET
Abs Immature Granulocytes: 0.03 10*3/uL (ref 0.00–0.07)
Basophils Absolute: 0.1 10*3/uL (ref 0.0–0.1)
Basophils Relative: 1 %
Eosinophils Absolute: 0.3 10*3/uL (ref 0.0–0.5)
Eosinophils Relative: 4 %
HCT: 42.4 % (ref 36.0–46.0)
Hemoglobin: 14 g/dL (ref 12.0–15.0)
Immature Granulocytes: 0 %
Lymphocytes Relative: 26 %
Lymphs Abs: 2 10*3/uL (ref 0.7–4.0)
MCH: 32.1 pg (ref 26.0–34.0)
MCHC: 33 g/dL (ref 30.0–36.0)
MCV: 97.2 fL (ref 80.0–100.0)
Monocytes Absolute: 0.7 10*3/uL (ref 0.1–1.0)
Monocytes Relative: 9 %
Neutro Abs: 4.8 10*3/uL (ref 1.7–7.7)
Neutrophils Relative %: 60 %
Platelets: 253 10*3/uL (ref 150–400)
RBC: 4.36 MIL/uL (ref 3.87–5.11)
RDW: 12.9 % (ref 11.5–15.5)
WBC: 7.9 10*3/uL (ref 4.0–10.5)
nRBC: 0 % (ref 0.0–0.2)

## 2022-06-22 LAB — COMPREHENSIVE METABOLIC PANEL
ALT: 26 U/L (ref 0–44)
AST: 16 U/L (ref 15–41)
Albumin: 3.5 g/dL (ref 3.5–5.0)
Alkaline Phosphatase: 55 U/L (ref 38–126)
Anion gap: 10 (ref 5–15)
BUN: 10 mg/dL (ref 6–20)
CO2: 26 mmol/L (ref 22–32)
Calcium: 9 mg/dL (ref 8.9–10.3)
Chloride: 104 mmol/L (ref 98–111)
Creatinine, Ser: 0.65 mg/dL (ref 0.44–1.00)
GFR, Estimated: >60 mL/min (ref >60)
Glucose, Bld: 181 mg/dL — ABNORMAL HIGH (ref 70–99)
Potassium: 3.8 mmol/L (ref 3.5–5.1)
Sodium: 140 mmol/L (ref 135–145)
Total Bilirubin: 0.7 mg/dL (ref 0.3–1.2)
Total Protein: 7.2 g/dL (ref 6.5–8.1)

## 2022-06-22 LAB — GLUCOSE, CAPILLARY
Glucose-Capillary: 164 mg/dL — ABNORMAL HIGH (ref 70–99)
Glucose-Capillary: 185 mg/dL — ABNORMAL HIGH (ref 70–99)

## 2022-06-22 MED ORDER — ONDANSETRON HCL 4 MG PO TABS: 4.0000 mg | ORAL_TABLET | Freq: Four times a day (QID) | ORAL | Status: DC | PRN

## 2022-06-22 MED ORDER — DULOXETINE HCL 30 MG PO CPEP
30.0000 mg | ORAL_CAPSULE | Freq: Every day | ORAL | Status: DC
  Administered 2022-06-22 – 2022-06-25 (×4): 30 mg via ORAL
  Filled 2022-06-22 (×4): qty 1

## 2022-06-22 MED ORDER — RIVAROXABAN 20 MG PO TABS
20.0000 mg | ORAL_TABLET | Freq: Every day | ORAL | Status: DC
  Administered 2022-06-22 (×2): 20 mg via ORAL
  Filled 2022-06-22 (×2): qty 1

## 2022-06-22 MED ORDER — FUROSEMIDE 10 MG/ML IJ SOLN
40.0000 mg | Freq: Two times a day (BID) | INTRAMUSCULAR | Status: DC
  Administered 2022-06-22 – 2022-06-24 (×4): 40 mg via INTRAVENOUS
  Filled 2022-06-22 (×4): qty 4

## 2022-06-22 MED ORDER — ACETAMINOPHEN 325 MG PO TABS: 650.0000 mg | ORAL_TABLET | Freq: Four times a day (QID) | ORAL | Status: DC | PRN

## 2022-06-22 MED ORDER — HYDROCODONE-ACETAMINOPHEN 5-325 MG PO TABS: 1.0000 | ORAL_TABLET | ORAL | Status: DC | PRN

## 2022-06-22 MED ORDER — INSULIN ASPART 100 UNIT/ML IJ SOLN
0.0000 [IU] | Freq: Three times a day (TID) | INTRAMUSCULAR | Status: DC
  Administered 2022-06-22: 3 [IU] via SUBCUTANEOUS
  Administered 2022-06-23: 5 [IU] via SUBCUTANEOUS
  Administered 2022-06-23: 3 [IU] via SUBCUTANEOUS
  Administered 2022-06-23: 5 [IU] via SUBCUTANEOUS
  Administered 2022-06-24: 8 [IU] via SUBCUTANEOUS
  Administered 2022-06-24 – 2022-06-25 (×2): 5 [IU] via SUBCUTANEOUS
  Administered 2022-06-25: 3 [IU] via SUBCUTANEOUS

## 2022-06-22 MED ORDER — METOPROLOL TARTRATE 25 MG PO TABS
37.5000 mg | ORAL_TABLET | Freq: Two times a day (BID) | ORAL | Status: AC
  Administered 2022-06-22 – 2022-06-23 (×4): 37.5 mg via ORAL
  Filled 2022-06-22 (×4): qty 2

## 2022-06-22 MED ORDER — ONDANSETRON HCL 4 MG/2ML IJ SOLN: 4.0000 mg | Freq: Four times a day (QID) | INTRAMUSCULAR | Status: DC | PRN

## 2022-06-22 MED ORDER — ACETAMINOPHEN 650 MG RE SUPP: 650.0000 mg | Freq: Four times a day (QID) | RECTAL | Status: DC | PRN

## 2022-06-22 MED ORDER — METFORMIN HCL 500 MG PO TABS
1000.0000 mg | ORAL_TABLET | Freq: Two times a day (BID) | ORAL | Status: DC
  Administered 2022-06-22: 1000 mg via ORAL
  Filled 2022-06-22 (×2): qty 2

## 2022-06-22 MED ORDER — PANTOPRAZOLE SODIUM 40 MG PO TBEC
40.0000 mg | DELAYED_RELEASE_TABLET | Freq: Every day | ORAL | Status: DC
  Administered 2022-06-22 – 2022-06-25 (×4): 40 mg via ORAL
  Filled 2022-06-22 (×4): qty 1

## 2022-06-22 MED ORDER — LISINOPRIL 10 MG PO TABS
10.0000 mg | ORAL_TABLET | Freq: Every day | ORAL | Status: DC
  Administered 2022-06-22 – 2022-06-25 (×4): 10 mg via ORAL
  Filled 2022-06-22 (×4): qty 1

## 2022-06-22 MED ORDER — INSULIN ASPART 100 UNIT/ML IJ SOLN
0.0000 [IU] | Freq: Every day | INTRAMUSCULAR | Status: DC
  Administered 2022-06-23 – 2022-06-24 (×2): 2 [IU] via SUBCUTANEOUS

## 2022-06-22 MED ORDER — GLIPIZIDE ER 5 MG PO TB24: 5.0000 mg | ORAL_TABLET | Freq: Every day | ORAL | Status: DC

## 2022-06-22 NOTE — H&P (Signed)
History and Physical    Patient: Kelly Lang YBW:[id number] DOB: 06-Aug-1961 DOA: 06/21/2022 DOS: the patient was seen and examined on 06/22/2022 PCP: Ladell Pier, MD  Patient coming from: Home  Chief Complaint:  Chief Complaint  Patient presents with   Shortness of Breath   HPI: Kelly Lang is a 61 y.o. female with medical history significant of HTN, DM2, afib s/p ablation, HLD. Presenting with dyspnea. Symptoms started 4 days ago. She woke up feeling nauseous and had an episode of vomiting. She thought it may have been her gastroparesis, but she then felt tightness in her chest and back. She felt very short of breath. The sensation quickly subsided and she decided to go to work. She was doing well until yesterday around 1300hrs. She had the sensation of tightness in her chest and shortness of breath. There was no N/V. She was especially short of breath while walking. She became concerned and decided to go to the ED for assistance. She denies any other aggravating or alleviating factors.    Review of Systems: As mentioned in the history of present illness. All other systems reviewed and are negative. Past Medical History:  Diagnosis Date   Arthritis    "all over" (06/30/2017)   Chicken pox    Dyslipidemia    Dysrhythmia ablation 2018   a-fib   GERD (gastroesophageal reflux disease)    H/O: hypothyroidism    a. thyroid biopsy 1996 with synthroid. Stopped taking rx and was loss to follow up b. normal thyroid function 10/20/13   High cholesterol    Hx of cardiovascular stress test    ETT/Lexiscan Myoview (12/2013):  No ischemia; not gated; low risk.   Hypertension    Obesity    Persistent atrial fibrillation ([place])    a diagnosed 10/25/2013   Psoriasis    on Skyrizi   Seasonal allergies    Stroke [facility]) 2016   "some speech problems since" (06/30/2017)   Tachycardia induced cardiomyopathy (HCC)    a. 2/2 Afib with RVR for unknown duration (EF: 40-45%)   Thyroid disease     Type II diabetes mellitus ([place])    a. hg A1c 10, newly diagnosed (10/26/13)   Past Surgical History:  Procedure Laterality Date   ATRIAL FIBRILLATION ABLATION  06/30/2017   ATRIAL FIBRILLATION ABLATION N/A 06/30/2017   Procedure: Atrial Fibrillation Ablation;  Surgeon: Thompson Grayer, MD;  Location: [facility];  Service: Cardiovascular;  Laterality: N/A;   BICEPT TENODESIS Right 05/24/2020   Procedure: BICEPS TENODESIS;  Surgeon: Meredith Pel, MD;  Location: [place];  Service: Orthopedics;  Laterality: Right;   BIOPSY THYROID  1996   CARDIOVERSION N/A 12/07/2013   Procedure: CARDIOVERSION;  Surgeon: Casandra Doffing, MD;  Location: [facility] ENDOSCOPY;  Service: Cardiovascular;  Laterality: N/A;   CARDIOVERSION N/A 03/10/2016   Procedure: CARDIOVERSION;  Surgeon: Thayer Headings, MD;  Location: [facility] ENDOSCOPY;  Service: Cardiovascular;  Laterality: N/A;   ESOPHAGOGASTRODUODENOSCOPY N/A 12/07/2017   Procedure: ESOPHAGOGASTRODUODENOSCOPY (EGD);  Surgeon: Irene Shipper, MD;  Location: [facility] ENDOSCOPY;  Service: Endoscopy;  Laterality: N/A;   EYE MUSCLE SURGERY Right ~ 1966   SHOULDER ARTHROSCOPY WITH ROTATOR CUFF REPAIR Right 05/24/2020   Procedure: right shoulder arthroscopy, rotator cuff tear repair biceps tenodesis;  Surgeon: Meredith Pel, MD;  Location: [place];  Service: Orthopedics;  Laterality: Right;   TEE WITHOUT CARDIOVERSION N/A 06/29/2017   Procedure: TRANSESOPHAGEAL ECHOCARDIOGRAM (TEE);  Surgeon: Fay Records, MD;  Location:  [facility] ENDOSCOPY;  Service: Cardiovascular;  Laterality: N/A;   TONSILLECTOMY  1971   Social History:  reports that she quit smoking about 11 years ago. Her smoking use included cigarettes. She has a 22.00 pack-year smoking history. She has never used smokeless tobacco. She reports current alcohol use. She reports that she does not use drugs.  Allergies  Allergen Reactions   Jardiance [Empagliflozin] Other (See Comments)     Yeast infections   Codeine Itching and Rash    Family History  Adopted: Yes  Problem Relation Age of Onset   Hypertension Mother    Hyperlipidemia Mother     Prior to Admission medications   Medication Sig Start Date End Date Taking? Authorizing Provider  glipiZIDE (GLUCOTROL XL) 5 MG 24 hr tablet Take 1 tablet (5 mg total) by mouth daily with breakfast. 03/12/22  Yes Nida, Marella Chimes, MD  lisinopril (ZESTRIL) 10 MG tablet Take 1 tablet (10 mg total) by mouth daily. 06/12/22  Yes Ladell Pier, MD  metFORMIN (GLUCOPHAGE) 1000 MG tablet Take 1 tablet (1,000 mg total) by mouth 2 (two) times daily with a meal. 06/09/22  Yes Nida, Marella Chimes, MD  metoCLOPramide (REGLAN) 10 MG tablet Take 1 tablet (10 mg total) by mouth every 6 (six) hours. 04/29/22  Yes Dion Saucier A, PA  metoprolol tartrate (LOPRESSOR) 25 MG tablet Take 1.5 tablets (37.5 mg total) by mouth 2 (two) times daily. 02/26/21  Yes Nahser, Wonda Cheng, MD  omeprazole (PRILOSEC) 20 MG capsule Take 1 capsule (20 mg total) by mouth daily. 05/28/22  Yes Charlott Rakes, MD  atorvastatin (LIPITOR) 40 MG tablet Take 1 tablet (40 mg total) by mouth daily. 02/25/22 05/26/22  Gildardo Pounds, NP  Continuous Blood Gluc Receiver (DEXCOM G7 RECEIVER) DEVI Use to monitor BG continuously 03/12/22   Nida, Marella Chimes, MD  Continuous Blood Gluc Sensor (DEXCOM G7 SENSOR) MISC CHANGE SENSOR EVERY 10 DAYS 05/26/22   Cassandria Anger, MD  diclofenac Sodium (VOLTAREN) 1 % GEL Apply 2 g topically 4 (four) times daily. 06/25/21   Ladell Pier, MD  DULoxetine (CYMBALTA) 30 MG capsule Take 1 capsule (30 mg total) by mouth daily. 06/02/22   Ladell Pier, MD  Fluticasone-Salmeterol (ADVAIR) 250-50 MCG/DOSE AEPB Inhale 1 puff into the lungs 2 (two) times daily. Patient not taking: Reported on 05/08/2022 03/31/22   [provider]  mupirocin ointment (BACTROBAN) 2 % Apply 1 application topically daily. 07/01/21   McDonald, Stephan Minister, DPM   ONETOUCH DELICA LANCETS 95A MISC Use as instructed to check blood sugar up to 3 times daily. 10/05/18   Ladell Pier, MD  ONETOUCH VERIO test strip USE STRIPS TO CHECK GLUCOSE UP TO THREE TIMES DAILY AS DIRECTED 12/14/20   Ladell Pier, MD  rivaroxaban (XARELTO) 20 MG TABS tablet Take 1 tablet (20 mg total) by mouth daily with supper. 02/26/21   Nahser, Wonda Cheng, MD  Semaglutide, 2 MG/DOSE, (OZEMPIC, 2 MG/DOSE,) 8 MG/3ML SOPN INJECT 2MG SUBCUTANEOUSLY ONCE A WEEK Patient taking differently: INJECT 2MG SUBCUTANEOUSLY ONCE A WEEK 04/29/22   Brita Romp, NP  SEMGLEE, YFGN, 100 UNIT/ML Pen INJECT 80 UNITS SUBCUTANEOUSLY  AT BEDTIME 06/04/22   Cassandria Anger, MD  SKYRIZI PEN 150 MG/ML SOAJ SMARTSIG:1 pre-filled pen syringe SUB-Q Every 3 Months 06/18/22   [provider]  TRUEPLUS PEN NEEDLES 32G X 4 MM MISC USE AS DIRECTED AT BEDTIME. 08/23/20   Ladell Pier, MD    Physical Exam:  Vitals:   06/22/22 0700 06/22/22 0800 06/22/22 1023 06/22/22 1346  BP: 121/75 (!) 147/84 122/83 (!) 119/92  Pulse: 75 76 84 77  Resp: (!) 22 (!) '22 20 20  ' Temp:   98.7 F (37.1 C) 98.4 F (36.9 C)  TempSrc:   Oral Oral  SpO2: 93% 93% 96% 95%  Weight:      Height:       General: 61 y.o. female resting in bed in NAD Eyes: PERRL, normal sclera ENMT: Nares patent w/o discharge, orophaynx clear, dentition normal, ears w/o discharge/lesions/ulcers Neck: Supple, trachea midline Cardiovascular: RRR, +S1, S2, no m/g/r, equal pulses throughout Respiratory: CTABL, no w/r/r, normal WOB GI: BS+, NDNT, no masses noted, no organomegaly noted MSK: No e/c/c Neuro: A&O x 3, no focal deficits Psyc: Appropriate interaction and affect, calm/cooperative  Data Reviewed:  Lab Results  Component Value Date   NA 137 06/21/2022   K 4.5 06/21/2022   CO2 24 06/21/2022   GLUCOSE 218 (H) 06/21/2022   BUN 12 06/21/2022   CREATININE 0.73 06/21/2022   CALCIUM 9.3 06/21/2022   EGFR 91 01/21/2022    GFRNONAA >60 06/21/2022   Lab Results  Component Value Date   WBC 9.2 06/21/2022   HGB 14.0 06/21/2022   HCT 41.1 06/21/2022   MCV 95.4 06/21/2022   PLT 258 06/21/2022   CTA Chest/ab/pelvis 1. No evidence of aortic aneurysm or dissection. Mild aortic atherosclerosis. 2. Small bilateral pleural effusions. Diffuse interstitial pattern to the lungs likely representing edema. 3. No acute process demonstrated in the abdomen or pelvis. No evidence of bowel obstruction or inflammation. 4. 3.5 cm diameter right thyroid nodule with calcification. Recommend follow-up with elective thyroid US (ref: J Am Coll Radiol. 2015 Feb;12(2): 143-50).  EKG: sinus, PVCs  Assessment and Plan: Acute on chronic systolic HF Elevated Trp     - admitted to inpt, tele     - continue lasix     - I&O, daily weights     - echo     - trp 100 -> 80     - EKG as above     - spoke with cards (Dr. Cathie Olden), they will see in the AM  A fib on xarelto     - continue home regimen  DM2 DM gastroparesis     - 05/08/22 A1c 7.4     - SSI, glucose checks, DM diet     - continue home regimen for gastroparesis; no active N/V  HTN     - continue home regimen  HLD     - continue home regimen  Thyroid nodule     - as noted on CT     - follow up outpt  Hx of CVA     - continue home regimen  Advance Care Planning:   Code Status: FULL  Consults: Cardiology  Family Communication: w/ family at bedside  Severity of Illness: The appropriate patient status for this patient is INPATIENT. Inpatient status is judged to be reasonable and necessary in order to provide the required intensity of service to ensure the patient's safety. The patient's presenting symptoms, physical exam findings, and initial radiographic and laboratory data in the context of their chronic comorbidities is felt to place them at high risk for further clinical deterioration. Furthermore, it is not anticipated that the patient will be medically  stable for discharge from the hospital within 2 midnights of admission.   * I certify that at the point of admission it is  my clinical judgment that the patient will require inpatient hospital care spanning beyond 2 midnights from the point of admission due to high intensity of service, high risk for further deterioration and high frequency of surveillance required.*  Author: Jonnie Finner, DO 06/22/2022 2:14 PM  For on call review [email or web address].

## 2022-06-22 NOTE — ED Notes (Signed)
Patient given oatmeal for breakfast.

## 2022-06-23 ENCOUNTER — Inpatient Hospital Stay (HOSPITAL_BASED_OUTPATIENT_CLINIC_OR_DEPARTMENT_OTHER): Payer: 59

## 2022-06-23 DIAGNOSIS — Z7901 Long term (current) use of anticoagulants: Secondary | ICD-10-CM | POA: Diagnosis not present

## 2022-06-23 DIAGNOSIS — I5041 Acute combined systolic (congestive) and diastolic (congestive) heart failure: Secondary | ICD-10-CM

## 2022-06-23 DIAGNOSIS — I10 Essential (primary) hypertension: Secondary | ICD-10-CM

## 2022-06-23 DIAGNOSIS — E785 Hyperlipidemia, unspecified: Secondary | ICD-10-CM | POA: Diagnosis not present

## 2022-06-23 DIAGNOSIS — I63412 Cerebral infarction due to embolism of left middle cerebral artery: Secondary | ICD-10-CM | POA: Diagnosis not present

## 2022-06-23 DIAGNOSIS — I5022 Chronic systolic (congestive) heart failure: Secondary | ICD-10-CM

## 2022-06-23 DIAGNOSIS — I4891 Unspecified atrial fibrillation: Secondary | ICD-10-CM | POA: Diagnosis not present

## 2022-06-23 LAB — CBC
HCT: 41.7 % (ref 36.0–46.0)
HCT: 43 % (ref 36.0–46.0)
Hemoglobin: 13.8 g/dL (ref 12.0–15.0)
Hemoglobin: 14.6 g/dL (ref 12.0–15.0)
MCH: 32 pg (ref 26.0–34.0)
MCH: 32.2 pg (ref 26.0–34.0)
MCHC: 33.1 g/dL (ref 30.0–36.0)
MCHC: 34 g/dL (ref 30.0–36.0)
MCV: 96.8 fL (ref 80.0–100.0)
MCV: 94.7 fL (ref 80.0–100.0)
Platelets: 232 10*3/uL (ref 150–400)
Platelets: 268 10*3/uL (ref 150–400)
RBC: 4.31 MIL/uL (ref 3.87–5.11)
RBC: 4.54 MIL/uL (ref 3.87–5.11)
RDW: 12.7 % (ref 11.5–15.5)
RDW: 12.6 % (ref 11.5–15.5)
WBC: 7.1 10*3/uL (ref 4.0–10.5)
WBC: 8.3 10*3/uL (ref 4.0–10.5)
nRBC: 0 % (ref 0.0–0.2)
nRBC: 0 % (ref 0.0–0.2)

## 2022-06-23 LAB — COMPREHENSIVE METABOLIC PANEL
ALT: 26 U/L (ref 0–44)
AST: 17 U/L (ref 15–41)
Albumin: 3.5 g/dL (ref 3.5–5.0)
Alkaline Phosphatase: 54 U/L (ref 38–126)
Anion gap: 8 (ref 5–15)
BUN: 13 mg/dL (ref 6–20)
CO2: 28 mmol/L (ref 22–32)
Calcium: 9.5 mg/dL (ref 8.9–10.3)
Chloride: 104 mmol/L (ref 98–111)
Creatinine, Ser: 0.67 mg/dL (ref 0.44–1.00)
GFR, Estimated: >60 mL/min (ref >60)
Glucose, Bld: 180 mg/dL — ABNORMAL HIGH (ref 70–99)
Potassium: 4.2 mmol/L (ref 3.5–5.1)
Sodium: 140 mmol/L (ref 135–145)
Total Bilirubin: 0.7 mg/dL (ref 0.3–1.2)
Total Protein: 7.3 g/dL (ref 6.5–8.1)

## 2022-06-23 LAB — ECHOCARDIOGRAM COMPLETE
AR max vel: 1.72 cm2
AV Peak grad: 7.6 mmHg
Ao pk vel: 1.38 m/s
Area-P 1/2: 4.26 cm2
Calc EF: 48.6 %
Height: 65 in
S' Lateral: 3.2 cm
Single Plane A2C EF: 55.2 %
Single Plane A4C EF: 46.7 %
Weight: 3629.65 oz

## 2022-06-23 LAB — GLUCOSE, CAPILLARY
Glucose-Capillary: 235 mg/dL — ABNORMAL HIGH (ref 70–99)
Glucose-Capillary: 229 mg/dL — ABNORMAL HIGH (ref 70–99)
Glucose-Capillary: 192 mg/dL — ABNORMAL HIGH (ref 70–99)
Glucose-Capillary: 235 mg/dL — ABNORMAL HIGH (ref 70–99)

## 2022-06-23 LAB — CREATININE, SERUM
Creatinine, Ser: 0.83 mg/dL (ref 0.44–1.00)
GFR, Estimated: >60 mL/min (ref >60)

## 2022-06-23 LAB — HIV ANTIBODY (ROUTINE TESTING W REFLEX): HIV Screen 4th Generation wRfx: NONREACTIVE

## 2022-06-23 MED ORDER — ENOXAPARIN SODIUM 40 MG/0.4ML IJ SOSY
40.0000 mg | PREFILLED_SYRINGE | INTRAMUSCULAR | Status: DC
  Filled 2022-06-23: qty 0.4

## 2022-06-23 MED ORDER — SODIUM CHLORIDE 0.9% FLUSH
3.0000 mL | Freq: Two times a day (BID) | INTRAVENOUS | Status: DC
  Administered 2022-06-23: 3 mL via INTRAVENOUS

## 2022-06-23 MED ORDER — ASPIRIN 81 MG PO CHEW: 81.0000 mg | CHEWABLE_TABLET | ORAL | Status: DC

## 2022-06-23 MED ORDER — INSULIN GLARGINE-YFGN 100 UNIT/ML ~~LOC~~ SOLN
20.0000 [IU] | Freq: Every day | SUBCUTANEOUS | Status: DC
  Administered 2022-06-23 – 2022-06-24 (×2): 20 [IU] via SUBCUTANEOUS
  Filled 2022-06-23 (×3): qty 0.2

## 2022-06-23 MED ORDER — SODIUM CHLORIDE 0.9% FLUSH: 3.0000 mL | INTRAVENOUS | Status: DC | PRN

## 2022-06-23 MED ORDER — SODIUM CHLORIDE 0.9 % IV SOLN
INTRAVENOUS | Status: DC
Start: 2022-06-24 — End: 2022-06-24

## 2022-06-23 MED ORDER — METOPROLOL SUCCINATE ER 50 MG PO TB24
75.0000 mg | ORAL_TABLET | Freq: Every day | ORAL | Status: DC
  Administered 2022-06-24 – 2022-06-25 (×2): 75 mg via ORAL
  Filled 2022-06-23 (×2): qty 1

## 2022-06-23 MED ORDER — SODIUM CHLORIDE 0.9 % IV SOLN
250.0000 mL | INTRAVENOUS | Status: DC | PRN
Start: 2022-06-23 — End: 2022-06-24

## 2022-06-23 NOTE — Progress Notes (Addendum)
PROGRESS NOTE    Kelly Lang  [id number] DOB: 02-Aug-1961 DOA: 06/21/2022 PCP: Ladell Pier, MD   Brief Narrative:    Kelly Lang is a 61 y.o. female with medical history significant of HTN, DM2, afib s/p ablation, HLD. Presenting with dyspnea. Symptoms started 4 days ago. She was admitted with acute on chronic systolic CHF exacerbation along with elevated troponins and chest pain. Cardiology consulted and patient currently on diuresis. Cardiology planning [facility] in am.  Assessment & Plan:   Principal Problem:   Acute CHF (congestive heart failure) (HCC) Active Problems:   Atrial fibrillation    DM2 (diabetes mellitus, type 2) (HCC)   Dyslipidemia   Chronic anticoagulation   Essential hypertension, benign   Acute on chronic HFrEF (heart failure with reduced ejection fraction) ([place])   Cerebrovascular accident (CVA) due to 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)   Gastroparesis   Thyroid nodule   Elevated troponin  Assessment and Plan:  Acute on chronic systolic HF Elevated Trp     - admitted to inpt, tele     - continue lasix     - I&O, daily weights     - Echo with some WMA not present in 2019. Cardiology for [facility] in am   A fib on [place] now held for cath in am   DM2 DM gastroparesis     - 05/08/22 A1c 7.4     - SSI, glucose checks, DM diet     - continue home regimen for gastroparesis; no active N/V   HTN     - continue home regimen   HLD     - continue home regimen   Thyroid nodule     - as noted on CT     - follow up outpt   Hx of CVA     - continue home regimen    DVT prophylaxis:Xarelto, now held for Cath in am>>Lovenox Code Status: Full Family Communication: None at bedside Disposition Plan:  Status is: Inpatient Remains inpatient appropriate because: IV medications.   Consultants:  Cardiology  Procedures:  None  Antimicrobials:  None   Subjective: Patient seen and evaluated today with no new acute  complaints or concerns. No acute concerns or events noted overnight.  Objective: Vitals:   06/23/22 0119 06/23/22 0451 06/23/22 0455 06/23/22 1321  BP: 117/79 110/72  128/63  Pulse: 75 67  73  Resp: 14 20  (!) 22  Temp: 98.5 F (36.9 C) 98 F (36.7 C)  98.2 F (36.8 C)  TempSrc: Oral Oral  Oral  SpO2: 92% 94%  98%  Weight:   102.9 kg   Height:        Intake/Output Summary (Last 24 hours) at 06/23/2022 1416 Last data filed at 06/23/2022 1330 Gross per 24 hour  Intake 600 ml  Output 300 ml  Net 300 ml   Filed Weights   06/21/22 1612 06/23/22 0455  Weight: 104.3 kg 102.9 kg    Examination:  General exam: Appears calm and comfortable  Respiratory system: Clear to auscultation. Respiratory effort normal. Cardiovascular system: S1 & S2 heard, RRR.  Gastrointestinal system: Abdomen is soft Central nervous system: Alert and awake Extremities: No edema Skin: No significant lesions noted Psychiatry: Flat affect.    Data Reviewed: I have personally reviewed following labs and imaging studies  CBC: Recent Labs  Lab 06/21/22 1615 06/22/22 1504 06/23/22 0407  WBC 9.2 7.9 7.1  NEUTROABS  --  4.8  --  HGB 14.0 14.0 13.8  HCT 41.1 42.4 41.7  MCV 95.4 97.2 96.8  PLT 258 253 [id number]   Basic Metabolic Panel: Recent Labs  Lab 06/21/22 1615 06/22/22 1504 06/23/22 0407  NA 137 140 140  K 4.5 3.8 4.2  CL 103 104 104  CO2 24 26 28   GLUCOSE 218* 181* 180*  BUN 12 10 13   CREATININE 0.73 0.65 0.67  CALCIUM 9.3 9.0 9.5   GFR: Estimated Creatinine Clearance: 90.1 mL/min (by C-G formula based on SCr of 0.67 mg/dL). Liver Function Tests: Recent Labs  Lab 06/22/22 1504 06/23/22 0407  AST 16 17  ALT 26 26  ALKPHOS 55 54  BILITOT 0.7 0.7  PROT 7.2 7.3  ALBUMIN 3.5 3.5   No results for input(s): "LIPASE", "AMYLASE" in the last 168 hours. No results for input(s): "AMMONIA" in the last 168 hours. Coagulation Profile: No results for input(s): "INR", "PROTIME" in the last  168 hours. Cardiac Enzymes: No results for input(s): "CKTOTAL", "CKMB", "CKMBINDEX", "TROPONINI" in the last 168 hours. BNP (last 3 results) No results for input(s): "PROBNP" in the last 8760 hours. HbA1C: No results for input(s): "HGBA1C" in the last 72 hours. CBG: Recent Labs  Lab 06/22/22 1545 06/22/22 2122 06/23/22 0743 06/23/22 1134  GLUCAP 164* 185* 235* 229*   Lipid Profile: No results for input(s): "CHOL", "HDL", "LDLCALC", "TRIG", "CHOLHDL", "LDLDIRECT" in the last 72 hours. Thyroid Function Tests: No results for input(s): "TSH", "T4TOTAL", "FREET4", "T3FREE", "THYROIDAB" in the last 72 hours. Anemia Panel: No results for input(s): "VITAMINB12", "FOLATE", "FERRITIN", "TIBC", "IRON", "RETICCTPCT" in the last 72 hours. Sepsis Labs: No results for input(s): "PROCALCITON", "LATICACIDVEN" in the last 168 hours.  No results found for this or any previous visit (from the past 240 hour(s)).       Radiology Studies: ECHOCARDIOGRAM COMPLETE  Result Date: 06/23/2022    ECHOCARDIOGRAM REPORT   Patient Name:   Kelly Lang Date of Exam: 06/23/2022 Medical Rec #:  [id number]      Height:       65.0 in Accession #:    [id number]     Weight:       226.9 lb Date of Birth:  01-Jul-1961      BSA:          2.087 m Patient Age:    22 years       BP:           110/72 mmHg Patient Gender: F              HR:           74 bpm. Exam Location:  Inpatient Procedure: 2D Echo, Cardiac Doppler and Color Doppler Indications:    CHF  History:        Patient has no prior history of Echocardiogram examinations.                 Arrythmias:Atrial Fibrillation; Risk Factors:Hypertension,                 Diabetes and Dyslipidemia.  Sonographer:    Jyl Heinz Referring Phys: [id number] [place]  1. Septal , inferior apical hypokinesis . Left ventricular ejection fraction, by estimation, is 45 to 50%. The left ventricle has mildly decreased function. The left ventricle demonstrates regional wall  motion abnormalities (see scoring diagram/findings  for description). There is mild left ventricular hypertrophy. Left ventricular diastolic parameters were normal.  2. Right ventricular systolic function is normal. The right ventricular size  is normal.  3. Left atrial size was mildly dilated.  4. The mitral valve is abnormal. No evidence of mitral valve regurgitation. No evidence of mitral stenosis.  5. The aortic valve is tricuspid. There is mild calcification of the aortic valve. Aortic valve regurgitation is not visualized. Aortic valve sclerosis is present, with no evidence of aortic valve stenosis.  6. Aortic dilatation noted. There is dilatation of the aortic root, measuring 38 mm.  7. The inferior vena cava is normal in size with greater than 50% respiratory variability, suggesting right atrial pressure of 3 mmHg. FINDINGS  Left Ventricle: Septal , inferior apical hypokinesis. Left ventricular ejection fraction, by estimation, is 45 to 50%. The left ventricle has mildly decreased function. The left ventricle demonstrates regional wall motion abnormalities. The left ventricular internal cavity size was normal in size. There is mild left ventricular hypertrophy. Left ventricular diastolic parameters were normal. Right Ventricle: The right ventricular size is normal. No increase in right ventricular wall thickness. Right ventricular systolic function is normal. Left Atrium: Left atrial size was mildly dilated. Right Atrium: Right atrial size was normal in size. Pericardium: There is no evidence of pericardial effusion. Mitral Valve: The mitral valve is abnormal. There is mild thickening of the mitral valve leaflet(s). There is mild calcification of the mitral valve leaflet(s). Mild mitral annular calcification. No evidence of mitral valve regurgitation. No evidence of mitral valve stenosis. Tricuspid Valve: The tricuspid valve is normal in structure. Tricuspid valve regurgitation is not demonstrated. No  evidence of tricuspid stenosis. Aortic Valve: The aortic valve is tricuspid. There is mild calcification of the aortic valve. Aortic valve regurgitation is not visualized. Aortic valve sclerosis is present, with no evidence of aortic valve stenosis. Aortic valve peak gradient measures 7.6 mmHg. Pulmonic Valve: The pulmonic valve was normal in structure. Pulmonic valve regurgitation is trivial. No evidence of pulmonic stenosis. Aorta: Aortic dilatation noted. There is dilatation of the aortic root, measuring 38 mm. Venous: The inferior vena cava is normal in size with greater than 50% respiratory variability, suggesting right atrial pressure of 3 mmHg. IAS/Shunts: No atrial level shunt detected by color flow Doppler.  LEFT VENTRICLE PLAX 2D LVIDd:         4.50 cm      Diastology LVIDs:         3.20 cm      LV e' medial:    4.24 cm/s LV PW:         1.20 cm      LV E/e' medial:  13.3 LV IVS:        1.20 cm      LV e' lateral:   6.20 cm/s LVOT diam:     2.00 cm      LV E/e' lateral: 9.1 LV SV:         43 LV SV Index:   21 LVOT Area:     3.14 cm  LV Volumes (MOD) LV vol d, MOD A2C: 106.0 ml LV vol d, MOD A4C: 120.0 ml LV vol s, MOD A2C: 47.5 ml LV vol s, MOD A4C: 64.0 ml LV SV MOD A2C:     58.5 ml LV SV MOD A4C:     120.0 ml LV SV MOD BP:      55.0 ml RIGHT VENTRICLE             IVC RV Basal diam:  2.70 cm     IVC diam: 1.50 cm RV Mid diam:    2.40  cm RV S prime:     10.20 cm/s TAPSE (M-mode): 1.6 cm LEFT ATRIUM             Index        RIGHT ATRIUM           Index LA diam:        3.80 cm 1.82 cm/m   RA Area:     12.80 cm LA Vol (A2C):   47.1 ml 22.57 ml/m  RA Volume:   28.90 ml  13.85 ml/m LA Vol (A4C):   49.1 ml 23.53 ml/m LA Biplane Vol: 50.8 ml 24.34 ml/m  AORTIC VALVE AV Area (Vmax): 1.72 cm AV Vmax:        138.00 cm/s AV Peak Grad:   7.6 mmHg LVOT Vmax:      75.70 cm/s LVOT Vmean:     55.300 cm/s LVOT VTI:       0.138 m  AORTA Ao Root diam: 3.80 cm Ao Asc diam:  3.50 cm MITRAL VALVE               TRICUSPID  VALVE MV Area (PHT): 4.26 cm    TR Peak grad:   21.3 mmHg MV Decel Time: 178 msec    TR Vmax:        231.00 cm/s MV E velocity: 56.60 cm/s MV A velocity: 51.00 cm/s  SHUNTS MV E/A ratio:  1.11        Systemic VTI:  0.14 m                            Systemic Diam: 2.00 cm Jenkins Rouge MD Electronically signed by Jenkins Rouge MD Signature Date/Time: 06/23/2022/11:48:55 AM    Final    CT Angio Chest/Abd/Pel for Dissection W and/or W/WO  Result Date: 06/21/2022 CLINICAL DATA:  Chest pain or back pain with suspected aortic dissection. EXAM: CT ANGIOGRAPHY CHEST, ABDOMEN AND PELVIS TECHNIQUE: Non-contrast CT of the chest was initially obtained. Multidetector CT imaging through the chest, abdomen and pelvis was performed using the standard protocol during bolus administration of intravenous contrast. Multiplanar reconstructed images and MIPs were obtained and reviewed to evaluate the vascular anatomy. RADIATION DOSE REDUCTION: This exam was performed according to the departmental dose-optimization program which includes automated exposure control, adjustment of the mA and/or kV according to patient size and/or use of iterative reconstruction technique. CONTRAST:  172mL OMNIPAQUE IOHEXOL 350 MG/ML SOLN COMPARISON:  CT abdomen and pelvis 04/29/2022 FINDINGS: CTA CHEST FINDINGS Cardiovascular: Noncontrast images of the chest demonstrate a few scattered calcifications in the aorta. Moderate calcification in the coronary arteries. No evidence of intramural hematoma. Images obtained during arterial phase after contrast administration demonstrate normal caliber thoracic aorta. No aortic dissection. Great vessel origins are patent. Normal heart size. No pericardial effusions. Central pulmonary arteries are well opacified without evidence of significant pulmonary embolus. Mediastinum/Nodes: Hypodense mass with calcification in the right thyroid gland measuring 3.5 cm diameter. Esophagus is decompressed. No significant  lymphadenopathy. Lungs/Pleura: Small bilateral pleural effusions. Diffuse interstitial changes in the lungs likely represent edema although chronic fibrosis could also have this appearance. Atelectasis in the lung bases. Musculoskeletal: No chest wall abnormality. No acute or significant osseous findings. Review of the MIP images confirms the above findings. CTA ABDOMEN AND PELVIS FINDINGS VASCULAR Aorta: Normal caliber aorta without aneurysm, dissection, vasculitis or significant stenosis. Celiac: Patent without evidence of aneurysm, dissection, vasculitis or significant stenosis. SMA: Patent without evidence of aneurysm,  dissection, vasculitis or significant stenosis. Renals: Both renal arteries are patent without evidence of aneurysm, dissection, vasculitis, fibromuscular dysplasia or significant stenosis. IMA: Patent without evidence of aneurysm, dissection, vasculitis or significant stenosis. Inflow: Patent without evidence of aneurysm, dissection, vasculitis or significant stenosis. Veins: No obvious venous abnormality within the limitations of this arterial phase study. Review of the MIP images confirms the above findings. NON-VASCULAR Hepatobiliary: No focal liver abnormality is seen. No gallstones, gallbladder wall thickening, or biliary dilatation. Pancreas: Unremarkable. No pancreatic ductal dilatation or surrounding inflammatory changes. Spleen: Normal in size without focal abnormality. Adrenals/Urinary Tract: Adrenal glands are unremarkable. Kidneys are normal, without renal calculi, focal lesion, or hydronephrosis. Bladder is unremarkable. Stomach/Bowel: Stomach is within normal limits. Appendix appears normal. No evidence of bowel wall thickening, distention, or inflammatory changes. Lymphatic: No significant lymphadenopathy. Reproductive: Uterus and bilateral adnexa are unremarkable. Other: No abdominal wall hernia or abnormality. No abdominopelvic ascites. Musculoskeletal: No acute or significant  osseous findings. Review of the MIP images confirms the above findings. IMPRESSION: 1. No evidence of aortic aneurysm or dissection. Mild aortic atherosclerosis. 2. Small bilateral pleural effusions. Diffuse interstitial pattern to the lungs likely representing edema. 3. No acute process demonstrated in the abdomen or pelvis. No evidence of bowel obstruction or inflammation. 4. 3.5 cm diameter right thyroid nodule with calcification. Recommend follow-up with elective thyroid US (ref: J Am Coll Radiol. 2015 Feb;12(2): 143-50). Electronically Signed   By: Burman Nieves M.D.   On: 06/21/2022 19:11   DG Chest 2 View  Result Date: 06/21/2022 CLINICAL DATA:  Chest pain, shortness of breath EXAM: CHEST - 2 VIEW COMPARISON:  09/14/2015 FINDINGS: The heart size and mediastinal contours are within normal limits. Mild, diffuse bilateral interstitial pulmonary opacity. Disc degenerative disease of the thoracic spine. IMPRESSION: Mild, diffuse bilateral interstitial pulmonary opacity, likely mild edema. No focal airspace opacity. Electronically Signed   By: Jearld Lesch M.D.   On: 06/21/2022 16:48        Scheduled Meds:  DULoxetine  30 mg Oral Daily   furosemide  40 mg Intravenous Q12H   insulin aspart  0-15 Units Subcutaneous TID WC   insulin aspart  0-5 Units Subcutaneous QHS   lisinopril  10 mg Oral Daily   metoprolol tartrate  37.5 mg Oral BID   pantoprazole  40 mg Oral Daily   rivaroxaban  20 mg Oral Q supper     LOS: 1 day    Time spent: 35 minutes    Azar [place] Brunette, DO [facility]  If 7PM-7AM, please contact night-coverage [email or web address] 06/23/2022, 2:16 PM

## 2022-06-23 NOTE — Consult Note (Addendum)
Cardiology Consultation:   Patient ID: Kelly Lang MRN: [id number]; DOB: [birth year]  Admit date: 06/21/2022 Date of Consult: 06/23/2022  PCP:  Kelly Matar, MD   [facility] Providers Cardiologist:  Kristeen Miss, MD  Electrophysiologist:  Hillis Range, MD  {f   Patient Profile:   Kelly Lang is a 61 y.o. female with a hx of PAF, chronic anticoagulation, chronic systolic heart failure, DM2, CVA, HLD, HTN, thyroid nodule, gastroparesis, and chronic pain who is being seen 06/23/2022 for the evaluation of chest pain and DOE at the request of Dr. Sherryll Burger.  History of Present Illness:   Ms. Strack has a history of PAF with prior cardioversion. Unfortunately, she converted back to Afib after both DCCVs (2015, 2017). Nuclear stress test was negative for ischemia in 2015 and she was treated with flecainide. Echocardiogram 2014 with LVEF 40-45% - felt tachycardia mediated. CVA 09/2015 in the setting of coumadin/flecainide noncompliance. Her health insurance was not paying for flecainide.  Echo at that time with LVEF 20-25%. She was started on xarelto and metoprolol. She is unaware when she is in Afib. GDMT resulted in improvement in her LVEF on echo 2017. She was restarted on flecainide, but she did not maintain sinus rhythm. She subsequently underwent Afib ablation in 2018 with Dr. Johney Frame. She continues to follow with Dr. Elease Hashimoto and has done well. She continued on xarelto but flecainide was stopped after ablation.   Last echocardiogram 12/2017 with LVEF 60-65%, no RWMA,  and no significant valvular disease.   She is maintained on xarelto, 40 mg lipitor, 10 mg lisinopril, and 37.5 mg lopressor BID.   She presented to [facility] 06/21/22 with DOE, nausea and vomiting. She thought she was having issues with gastroparesis but also felt chest tightness and noticed DOE.   BNP mildly elevated at 295 HST 106 --> 80 EKG with sinus rhythm and PVCs  She was admitted for acute on chronic systolic  heart failure and elevated troponin with chest tightness. Cardiology was consulted. Echocardiogram this morning with LVEF 45-50% with RWMA - septal, inferior apical hypokinesis.   During my interview, she reports waking up a 0200 Thursday morning with abdominal cramping similar to prior episodes of gastroparesis. After waking up, she noted sharp stabbing pain in her back between her shoulder blades. She walked the the bathroom and noted significant DOE. She was able to go back to sleep but kept waking intermittently. She also reports chest pressure. Back pain lasted until about 11AM. On Friday, she again had the chest heaviness and DOE that was intermittent. Same symptoms prompted her to be evaluated at [facility] ER on Saturday 06/21/22. She was given nitro and IV lasix with complete resolution of her back pain, chest pain, and shortness of breath.   She reports to me no orthopnea or PND, no LE swelling. No current chest pain. She has a 15 pack year history of smoking, but quit 20 years ago. 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 She is adopted and does not know her family history.    Past Medical History:  Diagnosis Date   Arthritis    "all over" (06/30/2017)   Chicken pox    Dyslipidemia    Dysrhythmia ablation 2018   a-fib   GERD (gastroesophageal reflux disease)    H/O: hypothyroidism    a. thyroid biopsy 1996 with synthroid. Stopped taking rx and was loss to follow up b. normal thyroid function 10/20/13  High cholesterol    Hx of cardiovascular stress test    ETT/Lexiscan Myoview (12/2013):  No ischemia; not gated; low risk.   Hypertension    Obesity    Persistent atrial fibrillation (HCC)    a diagnosed 10/25/2013   Psoriasis    on Skyrizi   Seasonal allergies    Stroke [facility]) 2016   "some speech problems since" (06/30/2017)   Tachycardia induced cardiomyopathy (HCC)    a. 2/2 Afib with RVR for unknown duration  (EF: 40-45%)   Thyroid disease    Type II diabetes mellitus (HCC)    a. hg A1c 10, newly diagnosed (10/26/13)    Past Surgical History:  Procedure Laterality Date   ATRIAL FIBRILLATION ABLATION  06/30/2017   ATRIAL FIBRILLATION ABLATION N/A 06/30/2017   Procedure: Atrial Fibrillation Ablation;  Surgeon: Hillis Range, MD;  Location: [facility] INVASIVE CV LAB;  Service: Cardiovascular;  Laterality: N/A;   BICEPT TENODESIS Right 05/24/2020   Procedure: BICEPS TENODESIS;  Surgeon: Cammy Copa, MD;  Location: [facility];  Service: Orthopedics;  Laterality: Right;   BIOPSY THYROID  1996   CARDIOVERSION N/A 12/07/2013   Procedure: CARDIOVERSION;  Surgeon: Everette Rank, MD;  Location: [facility] ENDOSCOPY;  Service: Cardiovascular;  Laterality: N/A;   CARDIOVERSION N/A 03/10/2016   Procedure: CARDIOVERSION;  Surgeon: Vesta Mixer, MD;  Location: [facility] ENDOSCOPY;  Service: Cardiovascular;  Laterality: N/A;   ESOPHAGOGASTRODUODENOSCOPY N/A 12/07/2017   Procedure: ESOPHAGOGASTRODUODENOSCOPY (EGD);  Surgeon: Hilarie Fredrickson, MD;  Location: [facility] ENDOSCOPY;  Service: Endoscopy;  Laterality: N/A;   EYE MUSCLE SURGERY Right ~ 1966   SHOULDER ARTHROSCOPY WITH ROTATOR CUFF REPAIR Right 05/24/2020   Procedure: right shoulder arthroscopy, rotator cuff tear repair biceps tenodesis;  Surgeon: Cammy Copa, MD;  Location: [facility];  Service: Orthopedics;  Laterality: Right;   TEE WITHOUT CARDIOVERSION N/A 06/29/2017   Procedure: TRANSESOPHAGEAL ECHOCARDIOGRAM (TEE);  Surgeon: Pricilla Riffle, MD;  Location: [facility] ENDOSCOPY;  Service: Cardiovascular;  Laterality: N/A;   TONSILLECTOMY  1971     Home Medications:  Prior to Admission medications   Medication Sig Start Date End Date Taking? Authorizing Provider  acetaminophen (TYLENOL) 500 MG tablet Take 1,000 mg by mouth every 6 (six) hours as needed for moderate pain.   Yes [provider]  atorvastatin (LIPITOR) 40 MG tablet Take 1  tablet (40 mg total) by mouth daily. 02/25/22 06/22/22 Yes Claiborne Rigg, NP  diclofenac Sodium (VOLTAREN) 1 % GEL Apply 2 g topically 4 (four) times daily. 06/25/21  Yes Kelly Matar, MD  DULoxetine (CYMBALTA) 30 MG capsule Take 1 capsule (30 mg total) by mouth daily. 06/02/22  Yes Kelly Matar, MD  glipiZIDE (GLUCOTROL XL) 5 MG 24 hr tablet Take 1 tablet (5 mg total) by mouth daily with breakfast. 03/12/22  Yes Nida, Denman George, MD  lisinopril (ZESTRIL) 10 MG tablet Take 1 tablet (10 mg total) by mouth daily. 06/12/22  Yes Kelly Matar, MD  metFORMIN (GLUCOPHAGE) 1000 MG tablet Take 1 tablet (1,000 mg total) by mouth 2 (two) times daily with a meal. 06/09/22  Yes Nida, Denman George, MD  metoCLOPramide (REGLAN) 10 MG tablet Take 1 tablet (10 mg total) by mouth every 6 (six) hours. Patient taking differently: Take 10 mg by mouth every 6 (six) hours as needed (gastroparesis). 04/29/22  Yes Sherian Maroon A, PA  metoprolol tartrate (LOPRESSOR) 25 MG tablet Take 1.5 tablets (37.5 mg total) by mouth 2 (two) times daily.  02/26/21  Yes Nahser, [name], MD  omeprazole (PRILOSEC) 20 MG capsule Take 1 capsule (20 mg total) by mouth daily. 05/28/22  Yes Hoy Register, MD  rivaroxaban (XARELTO) 20 MG TABS tablet Take 1 tablet (20 mg total) by mouth daily with supper. 02/26/21  Yes Nahser, [name], MD  Semaglutide, 2 MG/DOSE, (OZEMPIC, 2 MG/DOSE,) 8 MG/3ML SOPN INJECT 2MG  SUBCUTANEOUSLY ONCE A WEEK Patient taking differently: Inject 2 mg into the skin once a week. 04/29/22  Yes Reardon, Whitney J, NP  SEMGLEE, YFGN, 100 UNIT/ML Pen INJECT 80 UNITS SUBCUTANEOUSLY  AT BEDTIME Patient taking differently: Inject 80 Units into the skin at bedtime. 06/04/22  Yes Nida, Denman George, MD  SKYRIZI PEN 150 MG/ML SOAJ Inject 150 mg into the skin every 3 (three) months. 06/18/22  Yes [provider]  Continuous Blood Gluc Receiver (DEXCOM G7 RECEIVER) DEVI Use to monitor BG continuously 03/12/22    Nida, Denman George, MD  Continuous Blood Gluc Sensor (DEXCOM G7 SENSOR) MISC CHANGE SENSOR EVERY 10 DAYS 05/26/22   Roma Kayser, MD  mupirocin ointment (BACTROBAN) 2 % Apply 1 application topically daily. Patient not taking: Reported on 06/22/2022 07/01/21   Edwin Cap, DPM  [facility] DELICA LANCETS 33G MISC Use as instructed to check blood sugar up to 3 times daily. 10/05/18   Kelly Matar, MD  ONETOUCH VERIO test strip USE STRIPS TO CHECK GLUCOSE UP TO THREE TIMES DAILY AS DIRECTED 12/14/20   Kelly Matar, MD  TRUEPLUS PEN NEEDLES 32G X 4 MM MISC USE AS DIRECTED AT BEDTIME. 08/23/20   Kelly Matar, MD    Inpatient Medications: Scheduled Meds:  DULoxetine  30 mg Oral Daily   furosemide  40 mg Intravenous Q12H   insulin aspart  0-15 Units Subcutaneous TID WC   insulin aspart  0-5 Units Subcutaneous QHS   lisinopril  10 mg Oral Daily   metoprolol tartrate  37.5 mg Oral BID   pantoprazole  40 mg Oral Daily   rivaroxaban  20 mg Oral Q supper   Continuous Infusions:  PRN Meds: acetaminophen **OR** acetaminophen, HYDROcodone-acetaminophen, nitroGLYCERIN, ondansetron **OR** ondansetron (ZOFRAN) IV  Allergies:    Allergies  Allergen Reactions   Jardiance [Empagliflozin] Other (See Comments)    Yeast infections   Codeine Itching and Rash    Social History:   Social History   Socioeconomic History   Marital status: Divorced    Spouse name: Not on file   Number of children: 0   Years of education: 14   Highest education level: Not on file  Occupational History   Occupation: Designer, television/film set  Tobacco Use   Smoking status: Former    Packs/day: 1.00    Years: 22.00    Total pack years: 22.00    Types: Cigarettes    Quit date: 12/01/2010    Years since quitting: 11.5   Smokeless tobacco: Never  Vaping Use   Vaping Use: Never used  Substance and Sexual Activity   Alcohol use: Yes    Comment: social   Drug use: No   Sexual activity: Not Currently   Other Topics Concern   Not on file  Social History Narrative   Moved to [place] from [place] in 2002.     Fun: shop, sew, decorate   Denies any beliefs effecting healthcare   Social Determinants of Health   Financial Resource Strain: Not on file  Food Insecurity: Not on file  Transportation Needs: Not on file  Physical Activity: Not on file  Stress: Not on file  Social Connections: Not on file  Intimate Partner Violence: Not on file    Family History:    Family History  Adopted: Yes  Problem Relation Age of Onset   Hypertension Mother    Hyperlipidemia Mother      ROS:  Please see the history of present illness.   All other ROS reviewed and negative.     Physical Exam/Data:   Vitals:   06/22/22 2011 06/23/22 0119 06/23/22 0451 06/23/22 0455  BP: 113/70 117/79 110/72   Pulse: 76 75 67   Resp: Temp: 98.8 F (37.1 C) 98.5 F (36.9 C) 98 F (36.7 C)   TempSrc: Oral Oral Oral   SpO2: 95% 92% 94%   Weight:    102.9 kg  Height:        Intake/Output Summary (Last 24 hours) at 06/23/2022 1244 Last data filed at 06/23/2022 0810 Gross per 24 hour  Intake 360 ml  Output 300 ml  Net 60 ml      06/23/2022    4:55 AM 06/21/2022    4:12 PM 05/21/2022   10:29 AM  Last 3 Weights  Weight (lbs) 226 lb 13.7 oz 230 lb 230 lb 9.6 oz  Weight (kg) 102.9 kg 104.327 kg 104.599 kg     Body mass index is 37.75 kg/m.  General:  obese female in NAD, coworker/friend at bedside HEENT: normal Neck: no JVD Vascular: No carotid bruits; Distal pulses 2+ bilaterally Cardiac:  normal S1, S2; RRR; no murmur  Lungs:  clear to auscultation bilaterally, no wheezing, rhonchi or rales  Abd: soft, nontender, no hepatomegaly  Ext: no edema Musculoskeletal:  No deformities, BUE and BLE strength normal and equal Skin: warm and dry  Neuro:  CNs 2-12 intact, no focal abnormalities noted Psych:  Normal affect   EKG:  The EKG was personally reviewed and demonstrates:  sinus  rhythm HR 88, PVCs Telemetry:  Telemetry was personally reviewed and demonstrates:  sinus rhythm in the 80s  Relevant CV Studies:  Echo 06/23/22: 1. Septal , inferior apical hypokinesis . Left ventricular ejection  fraction, by estimation, is 45 to 50%. The left ventricle has mildly  decreased function. The left ventricle demonstrates regional wall motion  abnormalities (see scoring diagram/findings   for description). There is mild left ventricular hypertrophy. Left  ventricular diastolic parameters were normal.   2. Right ventricular systolic function is normal. The right ventricular  size is normal.   3. Left atrial size was mildly dilated.   4. The mitral valve is abnormal. No evidence of mitral valve  regurgitation. No evidence of mitral stenosis.   5. The aortic valve is tricuspid. There is mild calcification of the  aortic valve. Aortic valve regurgitation is not visualized. Aortic valve  sclerosis is present, with no evidence of aortic valve stenosis.   6. Aortic dilatation noted. There is dilatation of the aortic root,  measuring 38 mm.   7. The inferior vena cava is normal in size with greater than 50%  respiratory variability, suggesting right atrial pressure of 3 mmHg.   Laboratory Data:  High Sensitivity Troponin:   Recent Labs  Lab 06/21/22 1615 06/21/22 1755  TROPONINIHS 106* 80*     Chemistry Recent Labs  Lab 06/21/22 1615 06/22/22 1504 06/23/22 0407  NA 137 140 140  K 4.5 3.8 4.2  CL 103 104 104  CO2 GLUCOSE 218*  181* 180*  BUN 12 10 13   CREATININE 0.73 0.65 0.67  CALCIUM 9.3 9.0 9.5  GFRNONAA >60 >60 >60  ANIONGAP 10 10 8     Recent Labs  Lab 06/22/22 1504 06/23/22 0407  PROT 7.2 7.3  ALBUMIN 3.5 3.5  AST 16 17  ALT 26 26  ALKPHOS 55 54  BILITOT 0.7 0.7   Lipids No results for input(s): "CHOL", "TRIG", "HDL", "LABVLDL", "LDLCALC", "CHOLHDL" in the last 168 hours.  Hematology Recent Labs  Lab 06/21/22 1615 06/22/22 1504  06/23/22 0407  WBC 9.2 7.9 7.1  RBC 4.31 4.36 4.31  HGB 14.0 14.0 13.8  HCT 41.1 42.4 41.7  MCV 95.4 97.2 96.8  MCH 32.5 32.1 32.0  MCHC 34.1 33.0 33.1  RDW 12.7 12.9 12.7  PLT 258 253 232   Thyroid No results for input(s): "TSH", "FREET4" in the last 168 hours.  BNP Recent Labs  Lab 06/21/22 1615  BNP 294.6*    DDimer No results for input(s): "DDIMER" in the last 168 hours.   Radiology/Studies:  ECHOCARDIOGRAM COMPLETE  Result Date: 06/23/2022    ECHOCARDIOGRAM REPORT   Patient Name:   ELLA GOLOMB Date of Exam: 06/23/2022 Medical Rec #:  [name]      Height:       65.0 in Accession #:    06/25/2022     Weight:       226.9 lb Date of Birth:  January 10, 1961      BSA:          2.087 m Patient Age:    61 years       BP:           110/72 mmHg Patient Gender: F              HR:           74 bpm. Exam Location:  Inpatient Procedure: 2D Echo, Cardiac Doppler and Color Doppler Indications:    CHF  History:        Patient has no prior history of Echocardiogram examinations.                 Arrythmias:Atrial Fibrillation; Risk Factors:Hypertension,                 Diabetes and Dyslipidemia.  Sonographer:    03/18/1961 Referring Phys: 08-18-2004 Cleatis Polka A KYLE IMPRESSIONS  1. Septal , inferior apical hypokinesis . Left ventricular ejection fraction, by estimation, is 45 to 50%. The left ventricle has mildly decreased function. The left ventricle demonstrates regional wall motion abnormalities (see scoring diagram/findings  for description). There is mild left ventricular hypertrophy. Left ventricular diastolic parameters were normal.  2. Right ventricular systolic function is normal. The right ventricular size is normal.  3. Left atrial size was mildly dilated.  4. The mitral valve is abnormal. No evidence of mitral valve regurgitation. No evidence of mitral stenosis.  5. The aortic valve is tricuspid. There is mild calcification of the aortic valve. Aortic valve regurgitation is not visualized. Aortic  valve sclerosis is present, with no evidence of aortic valve stenosis.  6. Aortic dilatation noted. There is dilatation of the aortic root, measuring 38 mm.  7. The inferior vena cava is normal in size with greater than 50% respiratory variability, suggesting right atrial pressure of 3 mmHg. FINDINGS  Left Ventricle: Septal , inferior apical hypokinesis. Left ventricular ejection fraction, by estimation, is 45 to 50%. The left ventricle has mildly decreased function. The left ventricle demonstrates regional  wall motion abnormalities. The left ventricular internal cavity size was normal in size. There is mild left ventricular hypertrophy. Left ventricular diastolic parameters were normal. Right Ventricle: The right ventricular size is normal. No increase in right ventricular wall thickness. Right ventricular systolic function is normal. Left Atrium: Left atrial size was mildly dilated. Right Atrium: Right atrial size was normal in size. Pericardium: There is no evidence of pericardial effusion. Mitral Valve: The mitral valve is abnormal. There is mild thickening of the mitral valve leaflet(s). There is mild calcification of the mitral valve leaflet(s). Mild mitral annular calcification. No evidence of mitral valve regurgitation. No evidence of mitral valve stenosis. Tricuspid Valve: The tricuspid valve is normal in structure. Tricuspid valve regurgitation is not demonstrated. No evidence of tricuspid stenosis. Aortic Valve: The aortic valve is tricuspid. There is mild calcification of the aortic valve. Aortic valve regurgitation is not visualized. Aortic valve sclerosis is present, with no evidence of aortic valve stenosis. Aortic valve peak gradient measures 7.6 mmHg. Pulmonic Valve: The pulmonic valve was normal in structure. Pulmonic valve regurgitation is trivial. No evidence of pulmonic stenosis. Aorta: Aortic dilatation noted. There is dilatation of the aortic root, measuring 38 mm. Venous: The inferior vena  cava is normal in size with greater than 50% respiratory variability, suggesting right atrial pressure of 3 mmHg. IAS/Shunts: No atrial level shunt detected by color flow Doppler.  LEFT VENTRICLE PLAX 2D LVIDd:         4.50 cm      Diastology LVIDs:         3.20 cm      LV e' medial:    4.24 cm/s LV PW:         1.20 cm      LV E/e' medial:  13.3 LV IVS:        1.20 cm      LV e' lateral:   6.20 cm/s LVOT diam:     2.00 cm      LV E/e' lateral: 9.1 LV SV:         43 LV SV Index:   21 LVOT Area:     3.14 cm  LV Volumes (MOD) LV vol d, MOD A2C: 106.0 ml LV vol d, MOD A4C: 120.0 ml LV vol s, MOD A2C: 47.5 ml LV vol s, MOD A4C: 64.0 ml LV SV MOD A2C:     58.5 ml LV SV MOD A4C:     120.0 ml LV SV MOD BP:      55.0 ml RIGHT VENTRICLE             IVC RV Basal diam:  2.70 cm     IVC diam: 1.50 cm RV Mid diam:    2.40 cm RV S prime:     10.20 cm/s TAPSE (M-mode): 1.6 cm LEFT ATRIUM             Index        RIGHT ATRIUM           Index LA diam:        3.80 cm 1.82 cm/m   RA Area:     12.80 cm LA Vol (A2C):   47.1 ml 22.57 ml/m  RA Volume:   28.90 ml  13.85 ml/m LA Vol (A4C):   49.1 ml 23.53 ml/m LA Biplane Vol: 50.8 ml 24.34 ml/m  AORTIC VALVE AV Area (Vmax): 1.72 cm AV Vmax:        138.00 cm/s AV Peak Grad:  7.6 mmHg LVOT Vmax:      75.70 cm/s LVOT Vmean:     55.300 cm/s LVOT VTI:       0.138 m  AORTA Ao Root diam: 3.80 cm Ao Asc diam:  3.50 cm MITRAL VALVE               TRICUSPID VALVE MV Area (PHT): 4.26 cm    TR Peak grad:   21.3 mmHg MV Decel Time: 178 msec    TR Vmax:        231.00 cm/s MV E velocity: 56.60 cm/s MV A velocity: 51.00 cm/s  SHUNTS MV E/A ratio:  1.11        Systemic VTI:  0.14 m                            Systemic Diam: 2.00 cm Charlton Haws MD Electronically signed by Charlton Haws MD Signature Date/Time: 06/23/2022/11:48:55 AM    Final    CT Angio Chest/Abd/Pel for Dissection W and/or W/WO  Result Date: 06/21/2022 CLINICAL DATA:  Chest pain or back pain with suspected aortic dissection. EXAM:  CT ANGIOGRAPHY CHEST, ABDOMEN AND PELVIS TECHNIQUE: Non-contrast CT of the chest was initially obtained. Multidetector CT imaging through the chest, abdomen and pelvis was performed using the standard protocol during bolus administration of intravenous contrast. Multiplanar reconstructed images and MIPs were obtained and reviewed to evaluate the vascular anatomy. RADIATION DOSE REDUCTION: This exam was performed according to the departmental dose-optimization program which includes automated exposure control, adjustment of the mA and/or kV according to patient size and/or use of iterative reconstruction technique. CONTRAST:  OMNIPAQUE IOHEXOL 350 MG/ML SOLN COMPARISON:  CT abdomen and pelvis 04/29/2022 FINDINGS: CTA CHEST FINDINGS Cardiovascular: Noncontrast images of the chest demonstrate a few scattered calcifications in the aorta. Moderate calcification in the coronary arteries. No evidence of intramural hematoma. Images obtained during arterial phase after contrast administration demonstrate normal caliber thoracic aorta. No aortic dissection. Great vessel origins are patent. Normal heart size. No pericardial effusions. Central pulmonary arteries are well opacified without evidence of significant pulmonary embolus. Mediastinum/Nodes: Hypodense mass with calcification in the right thyroid gland measuring 3.5 cm diameter. Esophagus is decompressed. No significant lymphadenopathy. Lungs/Pleura: Small bilateral pleural effusions. Diffuse interstitial changes in the lungs likely represent edema although chronic fibrosis could also have this appearance. Atelectasis in the lung bases. Musculoskeletal: No chest wall abnormality. No acute or significant osseous findings. Review of the MIP images confirms the above findings. CTA ABDOMEN AND PELVIS FINDINGS VASCULAR Aorta: Normal caliber aorta without aneurysm, dissection, vasculitis or significant stenosis. Celiac: Patent without evidence of aneurysm, dissection,  vasculitis or significant stenosis. SMA: Patent without evidence of aneurysm, dissection, vasculitis or significant stenosis. Renals: Both renal arteries are patent without evidence of aneurysm, dissection, vasculitis, fibromuscular dysplasia or significant stenosis. IMA: Patent without evidence of aneurysm, dissection, vasculitis or significant stenosis. Inflow: Patent without evidence of aneurysm, dissection, vasculitis or significant stenosis. Veins: No obvious venous abnormality within the limitations of this arterial phase study. Review of the MIP images confirms the above findings. NON-VASCULAR Hepatobiliary: No focal liver abnormality is seen. No gallstones, gallbladder wall thickening, or biliary dilatation. Pancreas: Unremarkable. No pancreatic ductal dilatation or surrounding inflammatory changes. Spleen: Normal in size without focal abnormality. Adrenals/Urinary Tract: Adrenal glands are unremarkable. Kidneys are normal, without renal calculi, focal lesion, or hydronephrosis. Bladder is unremarkable. Stomach/Bowel: Stomach is within normal limits. Appendix appears normal. No evidence of bowel  wall thickening, distention, or inflammatory changes. Lymphatic: No significant lymphadenopathy. Reproductive: Uterus and bilateral adnexa are unremarkable. Other: No abdominal wall hernia or abnormality. No abdominopelvic ascites. Musculoskeletal: No acute or significant osseous findings. Review of the MIP images confirms the above findings. IMPRESSION: 1. No evidence of aortic aneurysm or dissection. Mild aortic atherosclerosis. 2. Small bilateral pleural effusions. Diffuse interstitial pattern to the lungs likely representing edema. 3. No acute process demonstrated in the abdomen or pelvis. No evidence of bowel obstruction or inflammation. 4. 3.5 cm diameter right thyroid nodule with calcification. Recommend follow-up with elective thyroid US (ref: J Am Coll Radiol. 2015 Feb;12(2): 143-50). Electronically Signed    By: Burman Nieves M.D.   On: 06/21/2022 19:11   DG Chest 2 View  Result Date: 06/21/2022 CLINICAL DATA:  Chest pain, shortness of breath EXAM: CHEST - 2 VIEW COMPARISON:  09/14/2015 FINDINGS: The heart size and mediastinal contours are within normal limits. Mild, diffuse bilateral interstitial pulmonary opacity. Disc degenerative disease of the thoracic spine. IMPRESSION: Mild, diffuse bilateral interstitial pulmonary opacity, likely mild edema. No focal airspace opacity. Electronically Signed   By: Jearld Lesch M.D.   On: 06/21/2022 16:48     Assessment and Plan:   Chest pain Elevated HST Nuclear stress test nonischemic in 2015 POET nonischemic 2017 HS troponin 103-->80 EKG with septal Q waves, seen previously Echo with mildly reduced EF with regional wall motion abnormality - risk factors include smoking history, HTN, obesity, and DM - CE only mildly elevated, but event may have been on Wed, unclear if CE is downtrending from Wed - given WMA - will review pior echos with attending to determine if this is a new finding - if new WMA, would favor definitive angiography   Dyspnea on exertion Hx of chronic systolic heart failure with improved EF Hypertension - 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% - restoration of sinus rhythm and GDMT resulted in improved LVEF, normal on last echo in 2019 - GDMT includes BB and lisinopril - BNP mildly elevated - echo today with LVEF 45-50% - has received 40 mg IV lasix x 2 doses with improvement in her dyspnea - euvolemic on exam - she does not take lasix at home and has never had lower extremity swelling, denies recent orthopnea and PND   PAF Chronic anticoagulation Hx of ablation, flecainide stopped, has maintained sinus rhythm - continue xarelto  - maintaining sinus rhythm   ADDENDUM: Pt scheduled for left heart cath tomorrow 06/24/22 at 1:30 with Dr. Herbie Baltimore. Last dose of xarelto 4pm 06/22/22.   Risk  Assessment/Risk Scores:     TIMI Risk Score for Unstable Angina or Non-ST Elevation MI:   The patient's TIMI risk score is 3, which indicates a 13% risk of all cause mortality, new or recurrent myocardial infarction or need for urgent revascularization in the next 14 days.  New York Heart Association (NYHA) Functional Class NYHA Class II  CHA2DS2-VASc Score = 6 This indicates a 9.7% annual risk of stroke. The patient's score is based upon: CHF History: 1 HTN History: 1 Diabetes History: 1 Stroke History: 2 Vascular Disease History: 0 Age Score: 0 Gender Score: 1       For questions or updates, please contact [facility] Please consult [email or web address] for contact info under    Signed, Marcelino Duster, [place]  06/23/2022 12:44 PM

## 2022-06-23 NOTE — TOC Initial Note (Signed)
Transition of Care [facility]) - Initial/Assessment Note    Patient Details  Name: Kelly Lang MRN: [id number] Date of Birth: 27-Jan-1961  Transition of Care [facility]) CM/SW Contact:    Golda Acre, RN Phone Number: 06/23/2022, 10:59 AM  Clinical Narrative:                  Transition of Care [facility]) Screening Note   Patient Details  Name: Kelly Lang Date of Birth: 1961/04/24   Transition of Care [facility]) CM/SW Contact:    Golda Acre, RN Phone Number: 06/23/2022, 10:59 AM    Transition of Care Department [facility]) has reviewed patient and no TOC needs have been identified at this time. We will continue to monitor patient advancement through interdisciplinary progression rounds. If new patient transition needs arise, please place a TOC consult.    Expected Discharge Plan: Home/Self Care Barriers to Discharge: Continued Medical Work up   Patient Goals and CMS Choice Patient states their goals for this hospitalization and ongoing recovery are:: to go home CMS Medicare.gov Compare Post Acute Care list provided to:: Patient    Expected Discharge Plan and Services Expected Discharge Plan: Home/Self Care   Discharge Planning Services: CM Consult   Living arrangements for the past 2 months: Single Family Home                                      Prior Living Arrangements/Services Living arrangements for the past 2 months: Single Family Home Lives with:: Self Patient language and need for interpreter reviewed:: Yes Do you feel safe going back to the place where you live?: Yes            Criminal Activity/Legal Involvement Pertinent to Current Situation/Hospitalization: No - Comment as needed  Activities of Daily Living Home Assistive Devices/Equipment: None ADL Screening (condition at time of admission) Patient's cognitive ability adequate to safely complete daily activities?: Yes Is the patient deaf or have difficulty hearing?: No Does the patient have  difficulty seeing, even when wearing glasses/contacts?: No Does the patient have difficulty concentrating, remembering, or making decisions?: No Patient able to express need for assistance with ADLs?: Yes Does the patient have difficulty dressing or bathing?: No Independently performs ADLs?: Yes (appropriate for developmental age) Does the patient have difficulty walking or climbing stairs?: No Weakness of Legs: None Weakness of Arms/Hands: None  Permission Sought/Granted                  Emotional Assessment Appearance:: Appears stated age     Orientation: : Oriented to Self, Oriented to Place, Oriented to  Time, Oriented to Situation Alcohol / Substance Use: Not Applicable Psych Involvement: No (comment)  Admission diagnosis:  Elevated troponin [R77.8] Acute CHF (congestive heart failure) (HCC) [I50.9] Acute on chronic congestive heart failure, unspecified heart failure type (HCC) [I50.9] Patient Active Problem List   Diagnosis Date Noted   Thyroid nodule 06/22/2022   Elevated troponin 06/22/2022   Acute CHF (congestive heart failure) (HCC) 06/21/2022   Trigger ring finger of left hand 02/05/2021   Trigger little finger of right hand 02/05/2021   Mixed hyperlipidemia 09/17/2020   Multinodular goiter 09/16/2019   Chronic pain disorder 08/09/2018   Diabetic polyneuropathy associated with type 2 diabetes mellitus (HCC) 08/09/2018   Gastroparesis 12/21/2017   GI bleed 12/06/2017   Hepatic steatosis 12/04/2017   Acute on chronic HFrEF (heart failure with reduced ejection  fraction) (HCC) 09/16/2015   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 [place] [address] Phone: [phone] Fax: [phone]  [facility] at [facility] [address] [place] [address] Phone: [phone] Fax: [phone]     Social Determinants of Health (SDOH) Interventions    Readmission Risk Interventions     No data to display

## 2022-06-23 NOTE — Progress Notes (Signed)
   06/23/22 1500  Mobility  Activity Ambulated independently in hallway  Level of Assistance Independent  Assistive Device None  Distance Ambulated (ft) 700 ft  Activity Response Tolerated well  Transport method Ambulatory  $Mobility charge 1 Mobility    Pt ambulated independently throughout session, no AD required. Pt very pleasant during session. Returned to bed, all necessities in reach.  Marilynne Halsted  Mobility Specialist

## 2022-06-23 NOTE — H&P (View-Only) (Signed)
Cardiology Consultation:   Patient ID: Kelly Lang MRN: [id number]; DOB: 03/05/1961  Admit date: 06/21/2022 Date of Consult: 06/23/2022  PCP:  Johnson, Deborah B, MD   [facility] Providers Cardiologist:  Philip Nahser, MD  Electrophysiologist:  James Allred, MD  {f   Patient Profile:   Kelly Lang is a 61 y.o. female with a hx of PAF, chronic anticoagulation, chronic systolic heart failure, DM2, CVA, HLD, HTN, thyroid nodule, gastroparesis, and chronic pain who is being seen 06/23/2022 for the evaluation of chest pain and DOE at the request of Dr. Shah.  History of Present Illness:   Kelly Lang has a history of PAF with prior cardioversion. Unfortunately, she converted back to Afib after both DCCVs (2015, 2017). Nuclear stress test was negative for ischemia in 2015 and she was treated with flecainide. Echocardiogram 2014 with LVEF 40-45% - felt tachycardia mediated. CVA 09/2015 in the setting of coumadin/flecainide noncompliance. Her health insurance was not paying for flecainide.  Echo at that time with LVEF 20-25%. She was started on xarelto and metoprolol. She is unaware when she is in Afib. GDMT resulted in improvement in her LVEF on echo 2017. She was restarted on flecainide, but she did not maintain sinus rhythm. She subsequently underwent Afib ablation in 2018 with Dr. Allred. She continues to follow with Dr. Nahser and has done well. She continued on xarelto but flecainide was stopped after ablation.   Last echocardiogram 12/2017 with LVEF 60-65%, no RWMA,  and no significant valvular disease.   She is maintained on xarelto, 40 mg lipitor, 10 mg lisinopril, and 37.5 mg lopressor BID.   She presented to [facility] 06/21/22 with DOE, nausea and vomiting. She thought she was having issues with gastroparesis but also felt chest tightness and noticed DOE.   BNP mildly elevated at 295 HST 106 --> 80 EKG with sinus rhythm and PVCs  She was admitted for acute on chronic systolic  heart failure and elevated troponin with chest tightness. Cardiology was consulted. Echocardiogram this morning with LVEF 45-50% with RWMA - septal, inferior apical hypokinesis.   During my interview, she reports waking up a 0200 Thursday morning with abdominal cramping similar to prior episodes of gastroparesis. After waking up, she noted sharp stabbing pain in her back between her shoulder blades. She walked the the bathroom and noted significant DOE. She was able to go back to sleep but kept waking intermittently. She also reports chest pressure. Back pain lasted until about 11AM. On Friday, she again had the chest heaviness and DOE that was intermittent. Same symptoms prompted her to be evaluated at [facility] on Saturday 06/21/22. She was given nitro and IV lasix with complete resolution of her back pain, chest pain, and shortness of breath.   She reports to me no orthopnea or PND, no LE swelling. No current chest pain. She has a 15 pack year history of smoking, but quit 20 years ago. 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 She is adopted and does not know her family history.    Past Medical History:  Diagnosis Date   Arthritis    "all over" (06/30/2017)   Chicken pox    Dyslipidemia    Dysrhythmia ablation 2018   a-fib   GERD (gastroesophageal reflux disease)    H/O: hypothyroidism    a. thyroid biopsy 1996 with synthroid. Stopped taking rx and was loss to follow up b. normal thyroid function 10/20/13     High cholesterol    Hx of cardiovascular stress test    ETT/Lexiscan Myoview (12/2013):  No ischemia; not gated; low risk.   Hypertension    Obesity    Persistent atrial fibrillation (HCC)    a diagnosed 10/25/2013   Psoriasis    on Skyrizi   Seasonal allergies    Stroke (HCC) 2016   "some speech problems since" (06/30/2017)   Tachycardia induced cardiomyopathy (HCC)    a. 2/2 Afib with RVR for unknown duration  (EF: 40-45%)   Thyroid disease    Type II diabetes mellitus (HCC)    a. hg A1c 10, newly diagnosed (10/26/13)    Past Surgical History:  Procedure Laterality Date   ATRIAL FIBRILLATION ABLATION  06/30/2017   ATRIAL FIBRILLATION ABLATION N/A 06/30/2017   Procedure: Atrial Fibrillation Ablation;  Surgeon: Allred, James, MD;  Location: [facility];  Service: Cardiovascular;  Laterality: N/A;   BICEPT TENODESIS Right 05/24/2020   Procedure: BICEPS TENODESIS;  Surgeon: Dean, Gregory Scott, MD;  Location: [facility];  Service: Orthopedics;  Laterality: Right;   BIOPSY THYROID  1996   CARDIOVERSION N/A 12/07/2013   Procedure: CARDIOVERSION;  Surgeon: Jay Varanasi, MD;  Location: [facility];  Service: Cardiovascular;  Laterality: N/A;   CARDIOVERSION N/A 03/10/2016   Procedure: CARDIOVERSION;  Surgeon: Philip J Nahser, MD;  Location: [facility];  Service: Cardiovascular;  Laterality: N/A;   ESOPHAGOGASTRODUODENOSCOPY N/A 12/07/2017   Procedure: ESOPHAGOGASTRODUODENOSCOPY (EGD);  Surgeon: Perry, John N, MD;  Location: [facility];  Service: Endoscopy;  Laterality: N/A;   EYE MUSCLE SURGERY Right ~ 1966   SHOULDER ARTHROSCOPY WITH ROTATOR CUFF REPAIR Right 05/24/2020   Procedure: right shoulder arthroscopy, rotator cuff tear repair biceps tenodesis;  Surgeon: Dean, Gregory Scott, MD;  Location:  SURGERY CENTER;  Service: Orthopedics;  Laterality: Right;   TEE WITHOUT CARDIOVERSION N/A 06/29/2017   Procedure: TRANSESOPHAGEAL ECHOCARDIOGRAM (TEE);  Surgeon: Ross, Paula V, MD;  Location: [facility];  Service: Cardiovascular;  Laterality: N/A;   TONSILLECTOMY  1971     Home Medications:  Prior to Admission medications   Medication Sig Start Date End Date Taking? Authorizing Provider  acetaminophen (TYLENOL) 500 MG tablet Take 1,000 mg by mouth every 6 (six) hours as needed for moderate pain.   Yes [provider]  atorvastatin (LIPITOR) 40 MG tablet Take 1  tablet (40 mg total) by mouth daily. 02/25/22 06/22/22 Yes Fleming, Zelda W, NP  diclofenac Sodium (VOLTAREN) 1 % GEL Apply 2 g topically 4 (four) times daily. 06/25/21  Yes Johnson, Deborah B, MD  DULoxetine (CYMBALTA) 30 MG capsule Take 1 capsule (30 mg total) by mouth daily. 06/02/22  Yes Johnson, Deborah B, MD  glipiZIDE (GLUCOTROL XL) 5 MG 24 hr tablet Take 1 tablet (5 mg total) by mouth daily with breakfast. 03/12/22  Yes Nida, Gebreselassie W, MD  lisinopril (ZESTRIL) 10 MG tablet Take 1 tablet (10 mg total) by mouth daily. 06/12/22  Yes Johnson, Deborah B, MD  metFORMIN (GLUCOPHAGE) 1000 MG tablet Take 1 tablet (1,000 mg total) by mouth 2 (two) times daily with a meal. 06/09/22  Yes Nida, Gebreselassie W, MD  metoCLOPramide (REGLAN) 10 MG tablet Take 1 tablet (10 mg total) by mouth every 6 (six) hours. Patient taking differently: Take 10 mg by mouth every 6 (six) hours as needed (gastroparesis). 04/29/22  Yes Robbins, Cooper A, PA  metoprolol tartrate (LOPRESSOR) 25 MG tablet Take 1.5 tablets (37.5 mg total) by mouth 2 (two) times daily.   02/26/21  Yes Nahser, Philip J, MD  omeprazole (PRILOSEC) 20 MG capsule Take 1 capsule (20 mg total) by mouth daily. 05/28/22  Yes Newlin, Enobong, MD  rivaroxaban (XARELTO) 20 MG TABS tablet Take 1 tablet (20 mg total) by mouth daily with supper. 02/26/21  Yes Nahser, Philip J, MD  Semaglutide, 2 MG/DOSE, (OZEMPIC, 2 MG/DOSE,) 8 MG/3ML SOPN INJECT 2MG SUBCUTANEOUSLY ONCE A WEEK Patient taking differently: Inject 2 mg into the skin once a week. 04/29/22  Yes Reardon, Whitney J, NP  SEMGLEE, YFGN, 100 UNIT/ML Pen INJECT 80 UNITS SUBCUTANEOUSLY  AT BEDTIME Patient taking differently: Inject 80 Units into the skin at bedtime. 06/04/22  Yes Nida, Gebreselassie W, MD  SKYRIZI PEN 150 MG/ML SOAJ Inject 150 mg into the skin every 3 (three) months. 06/18/22  Yes [provider]  Continuous Blood Gluc Receiver (DEXCOM G7 RECEIVER) DEVI Use to monitor BG continuously 03/12/22    Nida, Gebreselassie W, MD  Continuous Blood Gluc Sensor (DEXCOM G7 SENSOR) MISC CHANGE SENSOR EVERY 10 DAYS 05/26/22   Nida, Gebreselassie W, MD  mupirocin ointment (BACTROBAN) 2 % Apply 1 application topically daily. Patient not taking: Reported on 06/22/2022 07/01/21   McDonald, Adam R, DPM  ONETOUCH DELICA LANCETS 33G MISC Use as instructed to check blood sugar up to 3 times daily. 10/05/18   Johnson, Deborah B, MD  ONETOUCH VERIO test strip USE STRIPS TO CHECK GLUCOSE UP TO THREE TIMES DAILY AS DIRECTED 12/14/20   Johnson, Deborah B, MD  TRUEPLUS PEN NEEDLES 32G X 4 MM MISC USE AS DIRECTED AT BEDTIME. 08/23/20   Johnson, Deborah B, MD    Inpatient Medications: Scheduled Meds:  DULoxetine  30 mg Oral Daily   furosemide  40 mg Intravenous Q12H   insulin aspart  0-15 Units Subcutaneous TID WC   insulin aspart  0-5 Units Subcutaneous QHS   lisinopril  10 mg Oral Daily   metoprolol tartrate  37.5 mg Oral BID   pantoprazole  40 mg Oral Daily   rivaroxaban  20 mg Oral Q supper   Continuous Infusions:  PRN Meds: acetaminophen **OR** acetaminophen, HYDROcodone-acetaminophen, nitroGLYCERIN, ondansetron **OR** ondansetron (ZOFRAN) IV  Allergies:    Allergies  Allergen Reactions   Jardiance [Empagliflozin] Other (See Comments)    Yeast infections   Codeine Itching and Rash    Social History:   Social History   Socioeconomic History   Marital status: Divorced    Spouse name: Not on file   Number of children: 0   Years of education: 14   Highest education level: Not on file  Occupational History   Occupation: catering manager  Tobacco Use   Smoking status: Former    Packs/day: 1.00    Years: 22.00    Total pack years: 22.00    Types: Cigarettes    Quit date: 12/01/2010    Years since quitting: 11.5   Smokeless tobacco: Never  Vaping Use   Vaping Use: Never used  Substance and Sexual Activity   Alcohol use: Yes    Comment: social   Drug use: No   Sexual activity: Not Currently   Other Topics Concern   Not on file  Social History Narrative   Moved to [place] from [place] in 2002.     Fun: shop, sew, decorate   Denies any beliefs effecting healthcare   Social Determinants of Health   Financial Resource Strain: Not on file  Food Insecurity: Not on file  Transportation Needs: Not on file    Physical Activity: Not on file  Stress: Not on file  Social Connections: Not on file  Intimate Partner Violence: Not on file    Family History:    Family History  Adopted: Yes  Problem Relation Age of Onset   Hypertension Mother    Hyperlipidemia Mother      ROS:  Please see the history of present illness.   All other ROS reviewed and negative.     Physical Exam/Data:   Vitals:   06/22/22 2011 06/23/22 0119 06/23/22 0451 06/23/22 0455  BP: 113/70 117/79 110/72   Pulse: 76 75 67   Resp: 18 14 20   Temp: 98.8 F (37.1 C) 98.5 F (36.9 C) 98 F (36.7 C)   TempSrc: Oral Oral Oral   SpO2: 95% 92% 94%   Weight:    102.9 kg  Height:        Intake/Output Summary (Last 24 hours) at 06/23/2022 1244 Last data filed at 06/23/2022 0810 Gross per 24 hour  Intake 360 ml  Output 300 ml  Net 60 ml      06/23/2022    4:55 AM 06/21/2022    4:12 PM 05/21/2022   10:29 AM  Last 3 Weights  Weight (lbs) 226 lb 13.7 oz 230 lb 230 lb 9.6 oz  Weight (kg) 102.9 kg 104.327 kg 104.599 kg     Body mass index is 37.75 kg/m.  General:  obese female in NAD, coworker/friend at bedside HEENT: normal Neck: no JVD Vascular: No carotid bruits; Distal pulses 2+ bilaterally Cardiac:  normal S1, S2; RRR; no murmur  Lungs:  clear to auscultation bilaterally, no wheezing, rhonchi or rales  Abd: soft, nontender, no hepatomegaly  Ext: no edema Musculoskeletal:  No deformities, BUE and BLE strength normal and equal Skin: warm and dry  Neuro:  CNs 2-12 intact, no focal abnormalities noted Psych:  Normal affect   EKG:  The EKG was personally reviewed and demonstrates:  sinus  rhythm HR 88, PVCs Telemetry:  Telemetry was personally reviewed and demonstrates:  sinus rhythm in the 80s  Relevant CV Studies:  Echo 06/23/22: 1. Septal , inferior apical hypokinesis . Left ventricular ejection  fraction, by estimation, is 45 to 50%. The left ventricle has mildly  decreased function. The left ventricle demonstrates regional wall motion  abnormalities (see scoring diagram/findings   for description). There is mild left ventricular hypertrophy. Left  ventricular diastolic parameters were normal.   2. Right ventricular systolic function is normal. The right ventricular  size is normal.   3. Left atrial size was mildly dilated.   4. The mitral valve is abnormal. No evidence of mitral valve  regurgitation. No evidence of mitral stenosis.   5. The aortic valve is tricuspid. There is mild calcification of the  aortic valve. Aortic valve regurgitation is not visualized. Aortic valve  sclerosis is present, with no evidence of aortic valve stenosis.   6. Aortic dilatation noted. There is dilatation of the aortic root,  measuring 38 mm.   7. The inferior vena cava is normal in size with greater than 50%  respiratory variability, suggesting right atrial pressure of 3 mmHg.   Laboratory Data:  High Sensitivity Troponin:   Recent Labs  Lab 06/21/22 1615 06/21/22 1755  TROPONINIHS 106* 80*     Chemistry Recent Labs  Lab 06/21/22 1615 06/22/22 1504 06/23/22 0407  NA 137 140 140  K 4.5 3.8 4.2  CL 103 104 104  CO2 24 26 28  GLUCOSE 218*   181* 180*  BUN 12 10 13  CREATININE 0.73 0.65 0.67  CALCIUM 9.3 9.0 9.5  GFRNONAA >60 >60 >60  ANIONGAP 10 10 8    Recent Labs  Lab 06/22/22 1504 06/23/22 0407  PROT 7.2 7.3  ALBUMIN 3.5 3.5  AST 16 17  ALT 26 26  ALKPHOS 55 54  BILITOT 0.7 0.7   Lipids No results for input(s): "CHOL", "TRIG", "HDL", "LABVLDL", "LDLCALC", "CHOLHDL" in the last 168 hours.  Hematology Recent Labs  Lab 06/21/22 1615 06/22/22 1504  06/23/22 0407  WBC 9.2 7.9 7.1  RBC 4.31 4.36 4.31  HGB 14.0 14.0 13.8  HCT 41.1 42.4 41.7  MCV 95.4 97.2 96.8  MCH 32.5 32.1 32.0  MCHC 34.1 33.0 33.1  RDW 12.7 12.9 12.7  PLT 258 253 232   Thyroid No results for input(s): "TSH", "FREET4" in the last 168 hours.  BNP Recent Labs  Lab 06/21/22 1615  BNP 294.6*    DDimer No results for input(s): "DDIMER" in the last 168 hours.   Radiology/Studies:  ECHOCARDIOGRAM COMPLETE  Result Date: 06/23/2022    ECHOCARDIOGRAM REPORT   Patient Name:   Kelly Lang Date of Exam: 06/23/2022 Medical Rec #:  [id number]      Height:       65.0 in Accession #:    [id number]     Weight:       226.9 lb Date of Birth:  09/30/1961      BSA:          2.087 m Patient Age:    61 years       BP:           110/72 mmHg Patient Gender: F              HR:           74 bpm. Exam Location:  Inpatient Procedure: 2D Echo, Cardiac Doppler and Color Doppler Indications:    CHF  History:        Patient has no prior history of Echocardiogram examinations.                 Arrythmias:Atrial Fibrillation; Risk Factors:Hypertension,                 Diabetes and Dyslipidemia.  Sonographer:    Taylor Peper Referring Phys: [id number] TYRONE A KYLE IMPRESSIONS  1. Septal , inferior apical hypokinesis . Left ventricular ejection fraction, by estimation, is 45 to 50%. The left ventricle has mildly decreased function. The left ventricle demonstrates regional wall motion abnormalities (see scoring diagram/findings  for description). There is mild left ventricular hypertrophy. Left ventricular diastolic parameters were normal.  2. Right ventricular systolic function is normal. The right ventricular size is normal.  3. Left atrial size was mildly dilated.  4. The mitral valve is abnormal. No evidence of mitral valve regurgitation. No evidence of mitral stenosis.  5. The aortic valve is tricuspid. There is mild calcification of the aortic valve. Aortic valve regurgitation is not visualized. Aortic  valve sclerosis is present, with no evidence of aortic valve stenosis.  6. Aortic dilatation noted. There is dilatation of the aortic root, measuring 38 mm.  7. The inferior vena cava is normal in size with greater than 50% respiratory variability, suggesting right atrial pressure of 3 mmHg. FINDINGS  Left Ventricle: Septal , inferior apical hypokinesis. Left ventricular ejection fraction, by estimation, is 45 to 50%. The left ventricle has mildly decreased function. The left ventricle demonstrates regional   wall motion abnormalities. The left ventricular internal cavity size was normal in size. There is mild left ventricular hypertrophy. Left ventricular diastolic parameters were normal. Right Ventricle: The right ventricular size is normal. No increase in right ventricular wall thickness. Right ventricular systolic function is normal. Left Atrium: Left atrial size was mildly dilated. Right Atrium: Right atrial size was normal in size. Pericardium: There is no evidence of pericardial effusion. Mitral Valve: The mitral valve is abnormal. There is mild thickening of the mitral valve leaflet(s). There is mild calcification of the mitral valve leaflet(s). Mild mitral annular calcification. No evidence of mitral valve regurgitation. No evidence of mitral valve stenosis. Tricuspid Valve: The tricuspid valve is normal in structure. Tricuspid valve regurgitation is not demonstrated. No evidence of tricuspid stenosis. Aortic Valve: The aortic valve is tricuspid. There is mild calcification of the aortic valve. Aortic valve regurgitation is not visualized. Aortic valve sclerosis is present, with no evidence of aortic valve stenosis. Aortic valve peak gradient measures 7.6 mmHg. Pulmonic Valve: The pulmonic valve was normal in structure. Pulmonic valve regurgitation is trivial. No evidence of pulmonic stenosis. Aorta: Aortic dilatation noted. There is dilatation of the aortic root, measuring 38 mm. Venous: The inferior vena  cava is normal in size with greater than 50% respiratory variability, suggesting right atrial pressure of 3 mmHg. IAS/Shunts: No atrial level shunt detected by color flow Doppler.  LEFT VENTRICLE PLAX 2D LVIDd:         4.50 cm      Diastology LVIDs:         3.20 cm      LV e' medial:    4.24 cm/s LV PW:         1.20 cm      LV E/e' medial:  13.3 LV IVS:        1.20 cm      LV e' lateral:   6.20 cm/s LVOT diam:     2.00 cm      LV E/e' lateral: 9.1 LV SV:         43 LV SV Index:   21 LVOT Area:     3.14 cm  LV Volumes (MOD) LV vol d, MOD A2C: 106.0 ml LV vol d, MOD A4C: 120.0 ml LV vol s, MOD A2C: 47.5 ml LV vol s, MOD A4C: 64.0 ml LV SV MOD A2C:     58.5 ml LV SV MOD A4C:     120.0 ml LV SV MOD BP:      55.0 ml RIGHT VENTRICLE             IVC RV Basal diam:  2.70 cm     IVC diam: 1.50 cm RV Mid diam:    2.40 cm RV S prime:     10.20 cm/s TAPSE (M-mode): 1.6 cm LEFT ATRIUM             Index        RIGHT ATRIUM           Index LA diam:        3.80 cm 1.82 cm/m   RA Area:     12.80 cm LA Vol (A2C):   47.1 ml 22.57 ml/m  RA Volume:   28.90 ml  13.85 ml/m LA Vol (A4C):   49.1 ml 23.53 ml/m LA Biplane Vol: 50.8 ml 24.34 ml/m  AORTIC VALVE AV Area (Vmax): 1.72 cm AV Vmax:        138.00 cm/s AV Peak Grad:     7.6 mmHg LVOT Vmax:      75.70 cm/s LVOT Vmean:     55.300 cm/s LVOT VTI:       0.138 m  AORTA Ao Root diam: 3.80 cm Ao Asc diam:  3.50 cm MITRAL VALVE               TRICUSPID VALVE MV Area (PHT): 4.26 cm    TR Peak grad:   21.3 mmHg MV Decel Time: 178 msec    TR Vmax:        231.00 cm/s MV E velocity: 56.60 cm/s MV A velocity: 51.00 cm/s  SHUNTS MV E/A ratio:  1.11        Systemic VTI:  0.14 m                            Systemic Diam: 2.00 cm Peter Nishan MD Electronically signed by Peter Nishan MD Signature Date/Time: 06/23/2022/11:48:55 AM    Final    CT Angio Chest/Abd/Pel for Dissection W and/or W/WO  Result Date: 06/21/2022 CLINICAL DATA:  Chest pain or back pain with suspected aortic dissection. EXAM:  CT ANGIOGRAPHY CHEST, ABDOMEN AND PELVIS TECHNIQUE: Non-contrast CT of the chest was initially obtained. Multidetector CT imaging through the chest, abdomen and pelvis was performed using the standard protocol during bolus administration of intravenous contrast. Multiplanar reconstructed images and MIPs were obtained and reviewed to evaluate the vascular anatomy. RADIATION DOSE REDUCTION: This exam was performed according to the departmental dose-optimization program which includes automated exposure control, adjustment of the mA and/or kV according to patient size and/or use of iterative reconstruction technique. CONTRAST:  100mL OMNIPAQUE IOHEXOL 350 MG/ML SOLN COMPARISON:  CT abdomen and pelvis 04/29/2022 FINDINGS: CTA CHEST FINDINGS Cardiovascular: Noncontrast images of the chest demonstrate a few scattered calcifications in the aorta. Moderate calcification in the coronary arteries. No evidence of intramural hematoma. Images obtained during arterial phase after contrast administration demonstrate normal caliber thoracic aorta. No aortic dissection. Great vessel origins are patent. Normal heart size. No pericardial effusions. Central pulmonary arteries are well opacified without evidence of significant pulmonary embolus. Mediastinum/Nodes: Hypodense mass with calcification in the right thyroid gland measuring 3.5 cm diameter. Esophagus is decompressed. No significant lymphadenopathy. Lungs/Pleura: Small bilateral pleural effusions. Diffuse interstitial changes in the lungs likely represent edema although chronic fibrosis could also have this appearance. Atelectasis in the lung bases. Musculoskeletal: No chest wall abnormality. No acute or significant osseous findings. Review of the MIP images confirms the above findings. CTA ABDOMEN AND PELVIS FINDINGS VASCULAR Aorta: Normal caliber aorta without aneurysm, dissection, vasculitis or significant stenosis. Celiac: Patent without evidence of aneurysm, dissection,  vasculitis or significant stenosis. SMA: Patent without evidence of aneurysm, dissection, vasculitis or significant stenosis. Renals: Both renal arteries are patent without evidence of aneurysm, dissection, vasculitis, fibromuscular dysplasia or significant stenosis. IMA: Patent without evidence of aneurysm, dissection, vasculitis or significant stenosis. Inflow: Patent without evidence of aneurysm, dissection, vasculitis or significant stenosis. Veins: No obvious venous abnormality within the limitations of this arterial phase study. Review of the MIP images confirms the above findings. NON-VASCULAR Hepatobiliary: No focal liver abnormality is seen. No gallstones, gallbladder wall thickening, or biliary dilatation. Pancreas: Unremarkable. No pancreatic ductal dilatation or surrounding inflammatory changes. Spleen: Normal in size without focal abnormality. Adrenals/Urinary Tract: Adrenal glands are unremarkable. Kidneys are normal, without renal calculi, focal lesion, or hydronephrosis. Bladder is unremarkable. Stomach/Bowel: Stomach is within normal limits. Appendix appears normal. No evidence of bowel   wall thickening, distention, or inflammatory changes. Lymphatic: No significant lymphadenopathy. Reproductive: Uterus and bilateral adnexa are unremarkable. Other: No abdominal wall hernia or abnormality. No abdominopelvic ascites. Musculoskeletal: No acute or significant osseous findings. Review of the MIP images confirms the above findings. IMPRESSION: 1. No evidence of aortic aneurysm or dissection. Mild aortic atherosclerosis. 2. Small bilateral pleural effusions. Diffuse interstitial pattern to the lungs likely representing edema. 3. No acute process demonstrated in the abdomen or pelvis. No evidence of bowel obstruction or inflammation. 4. 3.5 cm diameter right thyroid nodule with calcification. Recommend follow-up with elective thyroid US (ref: J Am Coll Radiol. 2015 Feb;12(2): 143-50). Electronically Signed    By: William  Stevens M.D.   On: 06/21/2022 19:11   DG Chest 2 View  Result Date: 06/21/2022 CLINICAL DATA:  Chest pain, shortness of breath EXAM: CHEST - 2 VIEW COMPARISON:  09/14/2015 FINDINGS: The heart size and mediastinal contours are within normal limits. Mild, diffuse bilateral interstitial pulmonary opacity. Disc degenerative disease of the thoracic spine. IMPRESSION: Mild, diffuse bilateral interstitial pulmonary opacity, likely mild edema. No focal airspace opacity. Electronically Signed   By: Alex D Bibbey M.D.   On: 06/21/2022 16:48     Assessment and Plan:   Chest pain Elevated HST Nuclear stress test nonischemic in 2015 POET nonischemic 2017 HS troponin 103-->80 EKG with septal Q waves, seen previously Echo with mildly reduced EF with regional wall motion abnormality - risk factors include smoking history, HTN, obesity, and DM - CE only mildly elevated, but event may have been on Wed, unclear if CE is downtrending from Wed - given WMA - will review pior echos with attending to determine if this is a new finding - if new WMA, would favor definitive angiography   Dyspnea on exertion Hx of chronic systolic heart failure with improved EF Hypertension - 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% - restoration of sinus rhythm and GDMT resulted in improved LVEF, normal on last echo in 2019 - GDMT includes BB and lisinopril - BNP mildly elevated - echo today with LVEF 45-50% - has received 40 mg IV lasix x 2 doses with improvement in her dyspnea - euvolemic on exam - she does not take lasix at home and has never had lower extremity swelling, denies recent orthopnea and PND   PAF Chronic anticoagulation Hx of ablation, flecainide stopped, has maintained sinus rhythm - continue xarelto  - maintaining sinus rhythm   ADDENDUM: Pt scheduled for left heart cath tomorrow 06/24/22 at 1:30 with Dr. Harding. Last dose of xarelto 4pm 06/22/22.   Risk  Assessment/Risk Scores:     TIMI Risk Score for Unstable Angina or Non-ST Elevation MI:   The patient's TIMI risk score is 3, which indicates a 13% risk of all cause mortality, new or recurrent myocardial infarction or need for urgent revascularization in the next 14 days.  New York Heart Association (NYHA) Functional Class NYHA Class II  CHA2DS2-VASc Score = 6 This indicates a 9.7% annual risk of stroke. The patient's score is based upon: CHF History: 1 HTN History: 1 Diabetes History: 1 Stroke History: 2 Vascular Disease History: 0 Age Score: 0 Gender Score: 1       For questions or updates, please contact [facility] Please consult [email or web address] for contact info under    Signed, Amauri Keefe Nicole Shaconda Hajduk, PA  06/23/2022 12:44 PM  

## 2022-06-23 NOTE — Inpatient Diabetes Management (Signed)
Inpatient Diabetes Program Recommendations  AACE/ADA: New Consensus Statement on Inpatient Glycemic Control (2015)  Target Ranges:  Prepandial:   less than 140 mg/dL      Peak postprandial:   less than 180 mg/dL (1-2 hours)      Critically ill patients:  140 - 180 mg/dL   Lab Results  Component Value Date   GLUCAP 229 (H) 06/23/2022   HGBA1C 7.4 (A) 05/08/2022    Review of Glycemic Control  Diabetes history: DM2 Outpatient Diabetes medications: Semglee 80 units QHS, Ozempic 2 mg weekly, metformin 1000 mg BID, glipizide 5 mg QD Current orders for Inpatient glycemic control: Novolog 0-15 units TID with meals and 0-5 HS  HgbA1C - 7.4% (05/08/22) CBGs today: 235, 229  Will likely need part of basal insulin while inpatient.  Inpatient Diabetes Program Recommendations:    Consider adding Semglee 20 units QHS  Will follow glucose trends.   Thank you. Ailene Ards, RD, LDN, CDE Inpatient Diabetes Coordinator [phone]

## 2022-06-24 ENCOUNTER — Encounter (HOSPITAL_COMMUNITY): Payer: Self-pay | Admitting: Interventional Cardiology

## 2022-06-24 ENCOUNTER — Inpatient Hospital Stay (HOSPITAL_COMMUNITY)
Admission: EM | Disposition: A | Discharge status: Home / Self Care | Payer: Self-pay | Source: Home / Self Care | Attending: Internal Medicine

## 2022-06-24 DIAGNOSIS — I509 Heart failure, unspecified: Secondary | ICD-10-CM

## 2022-06-24 DIAGNOSIS — I2510 Atherosclerotic heart disease of native coronary artery without angina pectoris: Secondary | ICD-10-CM | POA: Diagnosis not present

## 2022-06-24 DIAGNOSIS — I4891 Unspecified atrial fibrillation: Secondary | ICD-10-CM | POA: Diagnosis not present

## 2022-06-24 DIAGNOSIS — I5023 Acute on chronic systolic (congestive) heart failure: Secondary | ICD-10-CM | POA: Diagnosis not present

## 2022-06-24 DIAGNOSIS — R778 Other specified abnormalities of plasma proteins: Secondary | ICD-10-CM

## 2022-06-24 DIAGNOSIS — R079 Chest pain, unspecified: Secondary | ICD-10-CM | POA: Diagnosis present

## 2022-06-24 DIAGNOSIS — I63412 Cerebral infarction due to embolism of left middle cerebral artery: Secondary | ICD-10-CM

## 2022-06-24 DIAGNOSIS — E785 Hyperlipidemia, unspecified: Secondary | ICD-10-CM

## 2022-06-24 DIAGNOSIS — Z7901 Long term (current) use of anticoagulants: Secondary | ICD-10-CM

## 2022-06-24 DIAGNOSIS — I5041 Acute combined systolic (congestive) and diastolic (congestive) heart failure: Secondary | ICD-10-CM | POA: Diagnosis not present

## 2022-06-24 HISTORY — PX: LEFT HEART CATH AND CORONARY ANGIOGRAPHY: CATH118249

## 2022-06-24 HISTORY — PX: CORONARY BALLOON ANGIOPLASTY: CATH118233

## 2022-06-24 LAB — BASIC METABOLIC PANEL
Anion gap: 10 (ref 5–15)
BUN: 22 mg/dL — ABNORMAL HIGH (ref 6–20)
CO2: 27 mmol/L (ref 22–32)
Calcium: 9.3 mg/dL (ref 8.9–10.3)
Chloride: 99 mmol/L (ref 98–111)
Creatinine, Ser: 0.88 mg/dL (ref 0.44–1.00)
GFR, Estimated: >60 mL/min (ref >60)
Glucose, Bld: 232 mg/dL — ABNORMAL HIGH (ref 70–99)
Potassium: 4 mmol/L (ref 3.5–5.1)
Sodium: 136 mmol/L (ref 135–145)

## 2022-06-24 LAB — CBC
HCT: 44.5 % (ref 36.0–46.0)
Hemoglobin: 15 g/dL (ref 12.0–15.0)
MCH: 32.7 pg (ref 26.0–34.0)
MCHC: 33.7 g/dL (ref 30.0–36.0)
MCV: 96.9 fL (ref 80.0–100.0)
Platelets: 256 10*3/uL (ref 150–400)
RBC: 4.59 MIL/uL (ref 3.87–5.11)
RDW: 12.5 % (ref 11.5–15.5)
WBC: 9.2 10*3/uL (ref 4.0–10.5)
nRBC: 0 % (ref 0.0–0.2)

## 2022-06-24 LAB — GLUCOSE, CAPILLARY
Glucose-Capillary: 294 mg/dL — ABNORMAL HIGH (ref 70–99)
Glucose-Capillary: 250 mg/dL — ABNORMAL HIGH (ref 70–99)
Glucose-Capillary: 228 mg/dL — ABNORMAL HIGH (ref 70–99)
Glucose-Capillary: 245 mg/dL — ABNORMAL HIGH (ref 70–99)
Glucose-Capillary: 233 mg/dL — ABNORMAL HIGH (ref 70–99)

## 2022-06-24 LAB — HEPARIN LEVEL (UNFRACTIONATED): Heparin Unfractionated: 0.15 IU/mL — ABNORMAL LOW (ref 0.30–0.70)

## 2022-06-24 LAB — MAGNESIUM: Magnesium: 2 mg/dL (ref 1.7–2.4)

## 2022-06-24 LAB — POCT ACTIVATED CLOTTING TIME: Activated Clotting Time: 287 seconds

## 2022-06-24 SURGERY — LEFT HEART CATH AND CORONARY ANGIOGRAPHY
Anesthesia: LOCAL

## 2022-06-24 MED ORDER — HEPARIN SODIUM (PORCINE) 1000 UNIT/ML IJ SOLN
INTRAMUSCULAR | Status: DC | PRN
  Administered 2022-06-24 (×2): 5000 [IU] via INTRAVENOUS
  Administered 2022-06-24: 2500 [IU] via INTRAVENOUS

## 2022-06-24 MED ORDER — FAMOTIDINE IN NACL 20-0.9 MG/50ML-% IV SOLN
INTRAVENOUS | Status: AC
  Filled 2022-06-24: qty 50

## 2022-06-24 MED ORDER — FENTANYL CITRATE (PF) 100 MCG/2ML IJ SOLN
INTRAMUSCULAR | Status: AC
  Filled 2022-06-24: qty 2

## 2022-06-24 MED ORDER — HYDRALAZINE HCL 20 MG/ML IJ SOLN: 10.0000 mg | INTRAMUSCULAR | Status: DC | PRN

## 2022-06-24 MED ORDER — HEPARIN SODIUM (PORCINE) 1000 UNIT/ML IJ SOLN
INTRAMUSCULAR | Status: AC
Start: 2022-06-24 — End: ?
  Filled 2022-06-24: qty 10

## 2022-06-24 MED ORDER — IOHEXOL 350 MG/ML SOLN
INTRAVENOUS | Status: DC | PRN
  Administered 2022-06-24: 95 mL

## 2022-06-24 MED ORDER — SODIUM CHLORIDE 0.9 % IV SOLN: 250.0000 mL | INTRAVENOUS | Status: DC | PRN

## 2022-06-24 MED ORDER — HEPARIN (PORCINE) 25000 UT/250ML-% IV SOLN
1500.0000 [IU]/h | INTRAVENOUS | Status: AC
  Administered 2022-06-24: 1000 [IU]/h via INTRAVENOUS
  Filled 2022-06-24: qty 250

## 2022-06-24 MED ORDER — ATORVASTATIN CALCIUM 80 MG PO TABS
80.0000 mg | ORAL_TABLET | Freq: Every day | ORAL | Status: DC
Start: 2022-06-24 — End: 2022-06-25
  Administered 2022-06-24 – 2022-06-25 (×2): 80 mg via ORAL
  Filled 2022-06-24 (×2): qty 1

## 2022-06-24 MED ORDER — SODIUM CHLORIDE 0.9% FLUSH: 3.0000 mL | INTRAVENOUS | Status: DC | PRN

## 2022-06-24 MED ORDER — CLOPIDOGREL BISULFATE 75 MG PO TABS
75.0000 mg | ORAL_TABLET | Freq: Every day | ORAL | Status: DC
  Administered 2022-06-25: 75 mg via ORAL
  Filled 2022-06-24: qty 1

## 2022-06-24 MED ORDER — SODIUM CHLORIDE 0.9 % IV SOLN: INTRAVENOUS | Status: DC

## 2022-06-24 MED ORDER — FENTANYL CITRATE (PF) 100 MCG/2ML IJ SOLN
INTRAMUSCULAR | Status: DC | PRN
  Administered 2022-06-24: 25 ug via INTRAVENOUS

## 2022-06-24 MED ORDER — LIDOCAINE HCL (PF) 1 % IJ SOLN
INTRAMUSCULAR | Status: AC
  Filled 2022-06-24: qty 30

## 2022-06-24 MED ORDER — ASPIRIN 81 MG PO CHEW
81.0000 mg | CHEWABLE_TABLET | Freq: Every day | ORAL | Status: DC
  Administered 2022-06-25: 81 mg via ORAL
  Filled 2022-06-24: qty 1

## 2022-06-24 MED ORDER — VERAPAMIL HCL 2.5 MG/ML IV SOLN
INTRAVENOUS | Status: AC
  Filled 2022-06-24: qty 2

## 2022-06-24 MED ORDER — LIDOCAINE HCL (PF) 1 % IJ SOLN
INTRAMUSCULAR | Status: DC | PRN
  Administered 2022-06-24: 2 mL

## 2022-06-24 MED ORDER — HEPARIN SODIUM (PORCINE) 1000 UNIT/ML IJ SOLN
INTRAMUSCULAR | Status: AC
  Filled 2022-06-24: qty 10

## 2022-06-24 MED ORDER — MIDAZOLAM HCL 2 MG/2ML IJ SOLN
INTRAMUSCULAR | Status: AC
  Filled 2022-06-24: qty 2

## 2022-06-24 MED ORDER — VERAPAMIL HCL 2.5 MG/ML IV SOLN
INTRAVENOUS | Status: DC | PRN
  Administered 2022-06-24: 10 mL via INTRA_ARTERIAL

## 2022-06-24 MED ORDER — SODIUM CHLORIDE 0.9% FLUSH
3.0000 mL | Freq: Two times a day (BID) | INTRAVENOUS | Status: DC
  Administered 2022-06-24 – 2022-06-25 (×2): 3 mL via INTRAVENOUS

## 2022-06-24 MED ORDER — FAMOTIDINE IN NACL 20-0.9 MG/50ML-% IV SOLN
INTRAVENOUS | Status: AC | PRN
  Administered 2022-06-24: 20 mg via INTRAVENOUS

## 2022-06-24 MED ORDER — LABETALOL HCL 5 MG/ML IV SOLN: 10.0000 mg | INTRAVENOUS | Status: DC | PRN

## 2022-06-24 MED ORDER — MIDAZOLAM HCL 2 MG/2ML IJ SOLN
INTRAMUSCULAR | Status: DC | PRN
  Administered 2022-06-24: 1 mg via INTRAVENOUS

## 2022-06-24 MED ORDER — HEPARIN (PORCINE) IN NACL 1000-0.9 UT/500ML-% IV SOLN
INTRAVENOUS | Status: DC | PRN
  Administered 2022-06-24 (×2): 500 mL

## 2022-06-24 MED ORDER — NITROGLYCERIN 1 MG/10 ML FOR IR/CATH LAB
INTRA_ARTERIAL | Status: AC
  Filled 2022-06-24: qty 10

## 2022-06-24 MED ORDER — CLOPIDOGREL BISULFATE 300 MG PO TABS
ORAL_TABLET | ORAL | Status: DC | PRN
  Administered 2022-06-24: 600 mg via ORAL

## 2022-06-24 MED ORDER — ONDANSETRON HCL 4 MG/2ML IJ SOLN: 4.0000 mg | Freq: Four times a day (QID) | INTRAMUSCULAR | Status: DC | PRN

## 2022-06-24 MED ORDER — ACETAMINOPHEN 325 MG PO TABS: 650.0000 mg | ORAL_TABLET | ORAL | Status: DC | PRN

## 2022-06-24 MED ORDER — CLOPIDOGREL BISULFATE 300 MG PO TABS
ORAL_TABLET | ORAL | Status: AC
  Filled 2022-06-24: qty 2

## 2022-06-24 MED ORDER — HEPARIN (PORCINE) IN NACL 1000-0.9 UT/500ML-% IV SOLN
INTRAVENOUS | Status: AC
  Filled 2022-06-24: qty 1000

## 2022-06-24 SURGICAL SUPPLY — 17 items
BALLN EMERGE MR PUSH 1.5X8 (BALLOONS) ×2
BALLN SAPPHIRE 2.0X12 (BALLOONS) ×2
BALLOON EMERGE MR PUSH 1.5X8 (BALLOONS) IMPLANT
BALLOON SAPPHIRE 2.0X12 (BALLOONS) IMPLANT
BAND ZEPHYR COMPRESS 30 LONG (HEMOSTASIS) ×1 IMPLANT
CATH 5FR JL3.5 JR4 ANG PIG MP (CATHETERS) ×1 IMPLANT
CATH VISTA GUIDE 6FR XBLAD3.5 (CATHETERS) ×1 IMPLANT
GLIDESHEATH SLEND A-KIT 6F 22G (SHEATH) ×1 IMPLANT
GUIDEWIRE INQWIRE 1.5J.035X260 (WIRE) IMPLANT
INQWIRE 1.5J .035X260CM (WIRE) ×2
KIT ENCORE 26 ADVANTAGE (KITS) ×1 IMPLANT
KIT HEART LEFT (KITS) ×2 IMPLANT
PACK CARDIAC CATHETERIZATION (CUSTOM PROCEDURE TRAY) ×2 IMPLANT
SHEATH PROBE COVER 6X72 (BAG) ×1 IMPLANT
TRANSDUCER W/STOPCOCK (MISCELLANEOUS) ×2 IMPLANT
TUBING CIL FLEX 10 FLL-RA (TUBING) ×2 IMPLANT
WIRE ASAHI PROWATER 180CM (WIRE) ×1 IMPLANT

## 2022-06-24 NOTE — Progress Notes (Addendum)
PROGRESS NOTE    Kelly Lang  [id number] DOB: 1961-08-28 DOA: 06/21/2022 PCP: Ladell Pier, MD   Brief Narrative:    Kelly Lang is a 61 y.o. female with past medical history of HTN, diabetes mellitus type 2, afib s/p ablation, hyperlipidemia presented to the hospital with dyspnea, shortness of breath for 4 days.  In the ED patient was noted to have acute on chronic systolic heart failure with elevated troponins. Cardiology consulted and patient underwent diuresis.  On 06/24/2022 patient underwent left heart catheterization and was started on heparin drip after catheterization.  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  Cardiology on board and underwent left heart catheterization today with findings of totally occluded proximal to mid LAD with heavily calcified segment with some collaterals.  Cardiology has recommended aspirin, Plavix, IV heparin and possible reattempt PCI again when thrombus has resolved.  Continue IV Lasix.  A fib on xarelto     - Xarelto now held for cath, has been started on IV heparin.   Diabetes mellitus type 2, DM gastroparesis Latest hemoglobin A1c of 7.4 on 05/08/22.  Continue sliding scale insulin diabetic diet.  Semaglutide has been resumed at 20 units at bedtime.  Patient is on long-acting insulin Semglee 80 units at bedtime, glipizide 5 mg daily,  semaglutide weekly and metformin 1000 mg twice daily at home..   Essential HTN Continue lisinopril, metoprolol   Hyperlipidemia. Continue Lipitor.   Thyroid nodule     -Noted on the CT scan.  We will follow-up as outpatient   Hx of CVA Continue aspirin Lipitor Plavix.    DVT prophylaxis:Xarelto, now has been transitioned to IV heparin.  Code Status: Full code  Family Communication:  None at bedside  Disposition Plan: Home  Status is: Inpatient  Remains inpatient appropriate because: Status postcardiac cath, IV heparin, potential for further cardiac evaluation and intervention   Consultants:  Cardiology  Procedures:  Cardiac catheterization 06/24/2022  Antimicrobials:  None   Subjective: Today, patient was seen and examined at bedside.  Patient denies any chest pain, increasing shortness of breath fever chills but has some cough.  Patient has some dyspnea on exertion while taking a shower but a little better than yesterday.  Patient was seen before cardiac catheterization.  Objective: Vitals:   06/24/22 1150 06/24/22 1205 06/24/22 1220 06/24/22 1245  BP: 108/69 (!) 101/59 103/62 96/68  Pulse: 81 78 79 74  Resp: 15 18 20 20   Temp:      TempSrc:    Oral  SpO2: 98% 93% 93% 96%  Weight:      Height:        Intake/Output Summary (Last 24 hours) at 06/24/2022 1303 Last data filed at 06/24/2022 0530 Gross per 24 hour  Intake 540 ml  Output 1300 ml  Net -760 ml    Filed Weights   06/21/22 1612 06/23/22 0455  Weight: 104.3 kg 102.9  kg   Body mass index is 37.75 kg/m.   Physical examination:  General: Obese built, not in obvious distress, on nasal cannula oxygen HENT:   No scleral pallor or icterus noted. Oral mucosa is moist.  Chest:  Clear breath sounds.  Diminished breath sounds bilaterally. No crackles or wheezes.  CVS: S1 &S2 heard. No murmur.  Regular rate and rhythm. Abdomen: Soft, nontender, nondistended.  Bowel sounds are heard.   Extremities:  No cyanosis, clubbing or edema.  Peripheral pulses are palpable. Psych: Alert, awake and oriented, normal mood CNS:  No cranial nerve deficits.  Power equal in all extremities.   Skin: Warm and dry.  No rashes noted.  Data Reviewed: I have personally reviewed following labs and imaging studies  CBC: Recent Labs  Lab 06/21/22 1615 06/22/22 1504 06/23/22 0407 06/23/22 1711 06/24/22 0352  WBC 9.2 7.9 7.1 8.3 9.2  NEUTROABS  --  4.8  --   --   --   HGB 14.0 14.0 13.8 14.6 15.0  HCT 41.1 42.4 41.7 43.0 44.5  MCV 95.4 97.2 96.8 94.7 96.9  PLT 258 253 232 [id number]    Basic Metabolic Panel: Recent Labs  Lab 06/21/22 1615 06/22/22 1504 06/23/22 0407 06/23/22 1711 06/24/22 0352  NA 137 140 140  --  136  K 4.5 3.8 4.2  --  4.0  CL 103 104 104  --  99  CO2 24 26 28   --  27  GLUCOSE 218* 181* 180*  --  232*  BUN 12 10 13   --  22*  CREATININE 0.73 0.65 0.67 0.83 0.88  CALCIUM 9.3 9.0 9.5  --  9.3  MG  --   --   --   --  2.0    GFR: Estimated Creatinine Clearance: 81.9 mL/min (by C-G formula based on SCr of 0.88 mg/dL). Liver Function Tests: Recent Labs  Lab 06/22/22 1504 06/23/22 0407  AST 16 17  ALT 26 26  ALKPHOS 55 54  BILITOT 0.7 0.7  PROT 7.2 7.3  ALBUMIN 3.5 3.5    No results for input(s): "LIPASE", "AMYLASE" in the last 168 hours. No results for input(s): "AMMONIA" in the last 168 hours. Coagulation Profile: No results for input(s): "INR", "PROTIME" in the last 168 hours. Cardiac Enzymes: No results for input(s): "CKTOTAL", "CKMB", "CKMBINDEX", "TROPONINI" in the last 168 hours. BNP (last 3 results) No results for input(s): "PROBNP" in the last 8760 hours. HbA1C: No results for input(s): "HGBA1C" in the last 72 hours. CBG: Recent Labs  Lab 06/23/22 1631 06/23/22 2038 06/24/22 0733 06/24/22 1003 06/24/22 1136  GLUCAP 192* 235* 294* 250* 228*    Lipid Profile: No results for input(s): "CHOL", "HDL", "LDLCALC", "TRIG", "CHOLHDL", "LDLDIRECT" in the  last 72 hours. Thyroid Function Tests: No results for input(s): "TSH", "T4TOTAL", "FREET4", "T3FREE", "THYROIDAB" in the last 72 hours. Anemia Panel: No results for input(s): "VITAMINB12", "FOLATE", "FERRITIN", "TIBC", "IRON", "RETICCTPCT" in the last 72 hours. Sepsis Labs: No results for input(s): "PROCALCITON", "LATICACIDVEN" in the last 168 hours.  No results found for this or any previous visit (from the past 240 hour(s)).   Radiology Studies: CARDIAC CATHETERIZATION  Result Date: 06/24/2022 CONCLUSIONS: 100% occlusion of the proximal to mid LAD within the heavily calcified segment.  Right to left collaterals fill the LAD retrograde.  Thrombus is noted beyond the high-grade obstruction in the proximal vessel. Widely patent left main, circumflex, and right coronary Mild reduction in anteroapical contractility.  EF 45 to 50%.  EDP  5 mmHg. RECOMMENDATIONS: IV heparin rather than Xarelto until a final decision concerning treatment strategy is made. Start anti-ischemic therapy. Patient is loaded with Plavix. Discussed with Dr. [place].  Consider repeat attempt at PCI after several days to allow thrombus resolution versus medical therapy for 6 weeks returning to the CTO team for consideration of PCI that would likely include plaque modification with orbital atherectomy.   ECHOCARDIOGRAM COMPLETE  Result Date: 06/23/2022    ECHOCARDIOGRAM REPORT   Patient Name:   Kelly Lang Date of Exam: 06/23/2022 Medical Rec #:  [id number]      Height:       65.0 in Accession #:    [id number]     Weight:       226.9 lb Date of Birth:  20-Sep-1961      BSA:          2.087 m Patient Age:    61 years       BP:           110/72 mmHg Patient Gender: F              HR:           74 bpm. Exam Location:  Inpatient Procedure: 2D Echo, Cardiac Doppler and Color Doppler Indications:    CHF  History:        Patient has no prior history of Echocardiogram examinations.                 Arrythmias:Atrial Fibrillation; Risk  Factors:Hypertension,                 Diabetes and Dyslipidemia.  Sonographer:    Cleatis Polka Referring Phys: [id number] Delila Pereyra A KYLE IMPRESSIONS  1. Septal , inferior apical hypokinesis . Left ventricular ejection fraction, by estimation, is 45 to 50%. The left ventricle has mildly decreased function. The left ventricle demonstrates regional wall motion abnormalities (see scoring diagram/findings  for description). There is mild left ventricular hypertrophy. Left ventricular diastolic parameters were normal.  2. Right ventricular systolic function is normal. The right ventricular size is normal.  3. Left atrial size was mildly dilated.  4. The mitral valve is abnormal. No evidence of mitral valve regurgitation. No evidence of mitral stenosis.  5. The aortic valve is tricuspid. There is mild calcification of the aortic valve. Aortic valve regurgitation is not visualized. Aortic valve sclerosis is present, with no evidence of aortic valve stenosis.  6. Aortic dilatation noted. There is dilatation of the aortic root, measuring 38 mm.  7. The inferior vena cava is normal in size with greater than 50% respiratory variability, suggesting right atrial pressure of 3 mmHg. FINDINGS  Left Ventricle: Septal , inferior apical hypokinesis. Left ventricular ejection fraction, by estimation, is 45 to 50%. The left ventricle has mildly decreased function. The left ventricle demonstrates regional wall motion abnormalities. The left ventricular internal cavity size was normal in size. There is mild left ventricular hypertrophy. Left ventricular diastolic parameters were normal. Right Ventricle: The right ventricular size is normal. No increase in right ventricular wall thickness. Right ventricular systolic function is normal. Left Atrium: Left atrial size was mildly dilated. Right Atrium: Right atrial size was normal in size. Pericardium: There is no evidence of pericardial effusion. Mitral Valve: The mitral valve is abnormal. There  is mild thickening of the mitral valve leaflet(s). There is mild calcification of the mitral valve leaflet(s). Mild mitral annular calcification. No evidence of mitral valve regurgitation. No evidence of mitral valve stenosis.  Tricuspid Valve: The tricuspid valve is normal in structure. Tricuspid valve regurgitation is not demonstrated. No evidence of tricuspid stenosis. Aortic Valve: The aortic valve is tricuspid. There is mild calcification of the aortic valve. Aortic valve regurgitation is not visualized. Aortic valve sclerosis is present, with no evidence of aortic valve stenosis. Aortic valve peak gradient measures 7.6 mmHg. Pulmonic Valve: The pulmonic valve was normal in structure. Pulmonic valve regurgitation is trivial. No evidence of pulmonic stenosis. Aorta: Aortic dilatation noted. There is dilatation of the aortic root, measuring 38 mm. Venous: The inferior vena cava is normal in size with greater than 50% respiratory variability, suggesting right atrial pressure of 3 mmHg. IAS/Shunts: No atrial level shunt detected by color flow Doppler.  LEFT VENTRICLE PLAX 2D LVIDd:         4.50 cm      Diastology LVIDs:         3.20 cm      LV e' medial:    4.24 cm/s LV PW:         1.20 cm      LV E/e' medial:  13.3 LV IVS:        1.20 cm      LV e' lateral:   6.20 cm/s LVOT diam:     2.00 cm      LV E/e' lateral: 9.1 LV SV:         43 LV SV Index:   21 LVOT Area:     3.14 cm  LV Volumes (MOD) LV vol d, MOD A2C: 106.0 ml LV vol d, MOD A4C: 120.0 ml LV vol s, MOD A2C: 47.5 ml LV vol s, MOD A4C: 64.0 ml LV SV MOD A2C:     58.5 ml LV SV MOD A4C:     120.0 ml LV SV MOD BP:      55.0 ml RIGHT VENTRICLE             IVC RV Basal diam:  2.70 cm     IVC diam: 1.50 cm RV Mid diam:    2.40 cm RV S prime:     10.20 cm/s TAPSE (M-mode): 1.6 cm LEFT ATRIUM             Index        RIGHT ATRIUM           Index LA diam:        3.80 cm 1.82 cm/m   RA Area:     12.80 cm LA Vol (A2C):   47.1 ml 22.57 ml/m  RA Volume:   28.90 ml   13.85 ml/m LA Vol (A4C):   49.1 ml 23.53 ml/m LA Biplane Vol: 50.8 ml 24.34 ml/m  AORTIC VALVE AV Area (Vmax): 1.72 cm AV Vmax:        138.00 cm/s AV Peak Grad:   7.6 mmHg LVOT Vmax:      75.70 cm/s LVOT Vmean:     55.300 cm/s LVOT VTI:       0.138 m  AORTA Ao Root diam: 3.80 cm Ao Asc diam:  3.50 cm MITRAL VALVE               TRICUSPID VALVE MV Area (PHT): 4.26 cm    TR Peak grad:   21.3 mmHg MV Decel Time: 178 msec    TR Vmax:        231.00 cm/s MV E velocity: 56.60 cm/s MV A velocity: 51.00 cm/s  SHUNTS MV E/A ratio:  1.11  Systemic VTI:  0.14 m                            Systemic Diam: 2.00 cm Jenkins Rouge MD Electronically signed by Jenkins Rouge MD Signature Date/Time: 06/23/2022/11:48:55 AM    Final      Scheduled Meds:  [START ON 06/25/2022] aspirin  81 mg Oral Daily   atorvastatin  80 mg Oral Daily   [START ON 06/25/2022] clopidogrel  75 mg Oral Q breakfast   DULoxetine  30 mg Oral Daily   enoxaparin (LOVENOX) injection  40 mg Subcutaneous Q24H   furosemide  40 mg Intravenous Q12H   insulin aspart  0-15 Units Subcutaneous TID WC   insulin aspart  0-5 Units Subcutaneous QHS   insulin glargine-yfgn  20 Units Subcutaneous QHS   lisinopril  10 mg Oral Daily   metoprolol succinate  75 mg Oral Daily   pantoprazole  40 mg Oral Daily   sodium chloride flush  3 mL Intravenous Q12H     LOS: 2 days   Flora Lipps, MD [facility] If 7PM-7AM, please contact night-coverage [email or web address] 06/24/2022, 1:03 PM

## 2022-06-24 NOTE — CV Procedure (Signed)
Totally occluded proximal to mid LAD with an heavily calcified segment right to left collaterals are well formed with the TIMI grade II flow visible thrombus is noted distal to the total occlusion. Able to cross with a wire but unable to advance a balloon across the occlusion. Widely patent left main, circumflex, and RCA Decreased LV function with EF 45% and EDP 5 mmHg  Patient was loaded with Plavix, IV heparin will be started after sheath is removed, start anti-ischemic therapy, and consider CTO PCI in 4 to 6 weeks if no significant symptoms on medical therapy.  If recurrent symptoms, may require repeat attempted intervention this hospital stay after thrombus has resolved with management strategy being orbital atherectomy followed by stenting.

## 2022-06-24 NOTE — Progress Notes (Signed)
ANTICOAGULATION CONSULT NOTE - Initial Consult  Pharmacy Consult for heparin Indication: chest pain/ACS  Allergies  Allergen Reactions   Jardiance [Empagliflozin] Other (See Comments)    Yeast infections   Codeine Itching and Rash    Patient Measurements: Height: 5\' 5"  (165.1 cm) Weight: 102.9 kg (226 lb 13.7 oz) IBW/kg (Calculated) : 57 Heparin Dosing Weight: 81.2 kg   Vital Signs: Temp: 98.2 F (36.8 C) (07/25 0431) Temp Source: Oral (07/25 1245) BP: 102/73 (07/25 1355) Pulse Rate: 71 (07/25 1355)  Labs: Recent Labs    06/21/22 1615 06/21/22 1755 06/22/22 1504 06/23/22 0407 06/23/22 1711 06/24/22 0352  HGB 14.0  --    < > 13.8 14.6 15.0  HCT 41.1  --    < > 41.7 43.0 44.5  PLT 258  --    < > 232 268 256  CREATININE 0.73  --    < > 0.67 0.83 0.88  TROPONINIHS 106* 80*  --   --   --   --    < > = values in this interval not displayed.    Estimated Creatinine Clearance: 81.9 mL/min (by C-G formula based on SCr of 0.88 mg/dL).   Medical History: Past Medical History:  Diagnosis Date   Arthritis    "all over" (06/30/2017)   Chicken pox    Dyslipidemia    Dysrhythmia ablation 2018   a-fib   GERD (gastroesophageal reflux disease)    H/O: hypothyroidism    a. thyroid biopsy 1996 with synthroid. Stopped taking rx and was loss to follow up b. normal thyroid function 10/20/13   High cholesterol    Hx of cardiovascular stress test    ETT/Lexiscan Myoview (12/2013):  No ischemia; not gated; low risk.   Hypertension    Obesity    Persistent atrial fibrillation (HCC)    a diagnosed 10/25/2013   Psoriasis    on Skyrizi   Seasonal allergies    Stroke [facility]) 2016   "some speech problems since" (06/30/2017)   Tachycardia induced cardiomyopathy (HCC)    a. 2/2 Afib with RVR for unknown duration (EF: 40-45%)   Thyroid disease    Type II diabetes mellitus (HCC)    a. hg A1c 10, newly diagnosed (10/26/13)    Medications:  Scheduled:   [START ON 06/25/2022] aspirin   81 mg Oral Daily   atorvastatin  80 mg Oral Daily   [START ON 06/25/2022] clopidogrel  75 mg Oral Q breakfast   DULoxetine  30 mg Oral Daily   enoxaparin (LOVENOX) injection  40 mg Subcutaneous Q24H   furosemide  40 mg Intravenous Q12H   insulin aspart  0-15 Units Subcutaneous TID WC   insulin aspart  0-5 Units Subcutaneous QHS   insulin glargine-yfgn  20 Units Subcutaneous QHS   lisinopril  10 mg Oral Daily   metoprolol succinate  75 mg Oral Daily   pantoprazole  40 mg Oral Daily   sodium chloride flush  3 mL Intravenous Q12H    Assessment: 61 yom who presented with chest pain - on Xarelto for hx Afib (LD 7/23).   Underwent cardiac cath 7/25 finding 100% prox-mid LAD with thrombus noted. Plan to start heparin 2 hours after TR band removed (documented on 7/25@1400 ).   Hgb 15, plt 256. No s/sx of bleeding. Will check initial heparin level and aPTT to see if correlate.   Goal of Therapy:  Heparin level 0.3-0.7 units/ml aPTT 66-102 seconds Monitor platelets by anticoagulation protocol: Yes   Plan:  Start heparin infusion at 1000 units/hr on 7/25 at 1600 Order heparin level in 6 hr  Monitor daily HL, CBC, and for s/sx of bleeding   Sherron Monday, PharmD, BCCCP Clinical Pharmacist  Phone: [phone] 06/24/2022 2:12 PM  Please check AMION for all [facility] phone numbers After 10:00 PM, call Main Pharmacy [phone]

## 2022-06-24 NOTE — Progress Notes (Addendum)
Pt brought to cath lab holding, [place] 6, alert and oriented, denies pain, IV NS placed to PIV to LAC by Eunice Blase, RN, accu-check completed, safety maintained, CL team at bedside at 1008 to take pt to procedure

## 2022-06-24 NOTE — Inpatient Diabetes Management (Signed)
Inpatient Diabetes Program Recommendations  AACE/ADA: New Consensus Statement on Inpatient Glycemic Control  Target Ranges:  Prepandial:   less than 140 mg/dL      Peak postprandial:   less than 180 mg/dL (1-2 hours)      Critically ill patients:  140 - 180 mg/dL    Latest Reference Range & Units 06/24/22 07:33 06/24/22 10:03 06/24/22 11:36  Glucose-Capillary 70 - 99 mg/dL 270 (H) 350 (H) 093 (H)    Latest Reference Range & Units 06/23/22 07:43 06/23/22 11:34 06/23/22 16:31 06/23/22 20:38  Glucose-Capillary 70 - 99 mg/dL 818 (H) 299 (H) 371 (H) 235 (H)   Review of Glycemic Control  Diabetes history: DM2 Outpatient Diabetes medications: Semglee 80 units QHS, Ozempic 2 mg Qweek, Glipizide XL 5 mg QAM, Metformin 1000 mg BID Current orders for Inpatient glycemic control: Semglee 20 units QHS, Novolog 0-15 units TID with meals, Novolog 0-5 units QHS  Inpatient Diabetes Program Recommendations:    Insulin: Please consider increasing Semglee to 22 units QHS and ordering Novolog 4 units TID with meals for meal coverage if patient eats at least 50% of meals.  Thanks, Orlando Penner, RN, MSN, CDCES Diabetes Coordinator Inpatient Diabetes Program [phone] (Team Pager from 8am to 5pm)

## 2022-06-24 NOTE — H&P (Signed)
Dyspnea and angina. EF 40-50 with RWMA Drop in EF c/w 2019 DM II H/O AF H/O AF ablation Cor angio to define anatomy and guide therapy

## 2022-06-24 NOTE — Interval H&P Note (Signed)
Cath Lab Visit (complete for each Cath Lab visit)  Clinical Evaluation Leading to the Procedure:   ACS: Yes.    Non-ACS:    Anginal Classification: CCS III  Anti-ischemic medical therapy: Minimal Therapy (1 class of medications)  Non-Invasive Test Results: No non-invasive testing performed  Prior CABG: No previous CABG      History and Physical Interval Note:  06/24/2022 7:59 AM  Kelly Lang  has presented today for surgery, with the diagnosis of unstable angina.  The various methods of treatment have been discussed with the patient and family. After consideration of risks, benefits and other options for treatment, the patient has consented to  Procedure(s): LEFT HEART CATH AND CORONARY ANGIOGRAPHY (N/A) as a surgical intervention.  The patient's history has been reviewed, patient examined, no change in status, stable for surgery.  I have reviewed the patient's chart and labs.  Questions were answered to the patient's satisfaction.     Lyn Records III

## 2022-06-25 ENCOUNTER — Other Ambulatory Visit (HOSPITAL_COMMUNITY): Payer: Self-pay

## 2022-06-25 DIAGNOSIS — I4891 Unspecified atrial fibrillation: Secondary | ICD-10-CM | POA: Diagnosis not present

## 2022-06-25 DIAGNOSIS — I5041 Acute combined systolic (congestive) and diastolic (congestive) heart failure: Secondary | ICD-10-CM | POA: Diagnosis not present

## 2022-06-25 DIAGNOSIS — E66812 Obesity, class 2: Secondary | ICD-10-CM | POA: Diagnosis present

## 2022-06-25 DIAGNOSIS — I214 Non-ST elevation (NSTEMI) myocardial infarction: Secondary | ICD-10-CM

## 2022-06-25 DIAGNOSIS — E1169 Type 2 diabetes mellitus with other specified complication: Secondary | ICD-10-CM

## 2022-06-25 DIAGNOSIS — I5023 Acute on chronic systolic (congestive) heart failure: Secondary | ICD-10-CM | POA: Diagnosis not present

## 2022-06-25 DIAGNOSIS — E669 Obesity, unspecified: Secondary | ICD-10-CM

## 2022-06-25 DIAGNOSIS — I10 Essential (primary) hypertension: Secondary | ICD-10-CM | POA: Diagnosis not present

## 2022-06-25 DIAGNOSIS — E041 Nontoxic single thyroid nodule: Secondary | ICD-10-CM

## 2022-06-25 LAB — BASIC METABOLIC PANEL
Anion gap: 6 (ref 5–15)
BUN: 17 mg/dL (ref 6–20)
CO2: 26 mmol/L (ref 22–32)
Calcium: 8.9 mg/dL (ref 8.9–10.3)
Chloride: 104 mmol/L (ref 98–111)
Creatinine, Ser: 0.94 mg/dL (ref 0.44–1.00)
GFR, Estimated: >60 mL/min (ref >60)
Glucose, Bld: 232 mg/dL — ABNORMAL HIGH (ref 70–99)
Potassium: 4.8 mmol/L (ref 3.5–5.1)
Sodium: 136 mmol/L (ref 135–145)

## 2022-06-25 LAB — LIPID PANEL
Cholesterol: 170 mg/dL (ref 0–200)
HDL: 25 mg/dL — ABNORMAL LOW (ref >40)
LDL Cholesterol: 84 mg/dL (ref 0–99)
Total CHOL/HDL Ratio: 6.8 RATIO
Triglycerides: 305 mg/dL — ABNORMAL HIGH (ref <150)
VLDL: 61 mg/dL — ABNORMAL HIGH (ref 0–40)

## 2022-06-25 LAB — APTT
aPTT: 28 seconds (ref 24–36)
aPTT: 36 seconds (ref 24–36)

## 2022-06-25 LAB — CBC
HCT: 39.7 % (ref 36.0–46.0)
Hemoglobin: 13.5 g/dL (ref 12.0–15.0)
MCH: 32.4 pg (ref 26.0–34.0)
MCHC: 34 g/dL (ref 30.0–36.0)
MCV: 95.2 fL (ref 80.0–100.0)
Platelets: 234 10*3/uL (ref 150–400)
RBC: 4.17 MIL/uL (ref 3.87–5.11)
RDW: 12.5 % (ref 11.5–15.5)
WBC: 6.8 10*3/uL (ref 4.0–10.5)
nRBC: 0 % (ref 0.0–0.2)

## 2022-06-25 LAB — HEPARIN LEVEL (UNFRACTIONATED): Heparin Unfractionated: 0.23 IU/mL — ABNORMAL LOW (ref 0.30–0.70)

## 2022-06-25 LAB — GLUCOSE, CAPILLARY
Glucose-Capillary: 226 mg/dL — ABNORMAL HIGH (ref 70–99)
Glucose-Capillary: 190 mg/dL — ABNORMAL HIGH (ref 70–99)

## 2022-06-25 LAB — MAGNESIUM: Magnesium: 2 mg/dL (ref 1.7–2.4)

## 2022-06-25 MED ORDER — CLOPIDOGREL BISULFATE 75 MG PO TABS
75.0000 mg | ORAL_TABLET | Freq: Every day | ORAL | 0 refills | Status: DC
  Filled 2022-06-25: qty 30, 30d supply, fill #0

## 2022-06-25 MED ORDER — RIVAROXABAN 20 MG PO TABS
20.0000 mg | ORAL_TABLET | Freq: Every day | ORAL | Status: DC
  Administered 2022-06-25: 20 mg via ORAL
  Filled 2022-06-25: qty 1

## 2022-06-25 MED ORDER — ISOSORBIDE MONONITRATE ER 30 MG PO TB24
30.0000 mg | ORAL_TABLET | Freq: Every day | ORAL | 0 refills | Status: DC
Start: 2022-06-26 — End: 2022-07-18
  Filled 2022-06-25: qty 30, 30d supply, fill #0

## 2022-06-25 MED ORDER — PANTOPRAZOLE SODIUM 40 MG PO TBEC
40.0000 mg | DELAYED_RELEASE_TABLET | Freq: Every day | ORAL | 3 refills | Status: DC
  Filled 2022-06-25: qty 30, 30d supply, fill #0

## 2022-06-25 MED ORDER — ISOSORBIDE MONONITRATE ER 30 MG PO TB24
30.0000 mg | ORAL_TABLET | Freq: Every day | ORAL | Status: DC
  Administered 2022-06-25: 30 mg via ORAL
  Filled 2022-06-25: qty 1

## 2022-06-25 MED ORDER — METOPROLOL SUCCINATE ER 25 MG PO TB24
75.0000 mg | ORAL_TABLET | Freq: Every day | ORAL | 0 refills | Status: DC
  Filled 2022-06-25: qty 90, 30d supply, fill #0

## 2022-06-25 MED ORDER — ATORVASTATIN CALCIUM 80 MG PO TABS
80.0000 mg | ORAL_TABLET | Freq: Every day | ORAL | 0 refills | Status: DC
  Filled 2022-06-25: qty 30, 30d supply, fill #0

## 2022-06-25 MED FILL — Nitroglycerin IV Soln 100 MCG/ML in D5W: INTRA_ARTERIAL | Qty: 10 | Status: AC

## 2022-06-25 NOTE — Assessment & Plan Note (Signed)
Patient was placed on insulin sliding scale during her hospitalization for glucose cover and monitoring  At the time of her discharge will resume her oral antihyperglycemic regimen along with metformin.

## 2022-06-25 NOTE — Discharge Summary (Addendum)
Physician Discharge Summary   Patient: Kelly Lang MRN: [id number] DOB: Jul 10, 1961  Admit date:     06/21/2022  Discharge date: 06/25/22  Discharge Physician: Kelly Lang   PCP: Kelly Pier, MD   Recommendations at discharge:    Patient has been placed on medical therapy for coronary artery disease, including clopidogrel (receive load while inpatient), high dose high potency statin with atorvastatin, and isosorbide. Up titrated metoprolol. If patient continue to have refractive angina on medical therapy may need re attempt PCI.  Follow up with cardiology as scheduled Follow up with Kelly Lang in 7 to 10 days.  Follow up thyroid nodule with elective ultrasound   Discharge Diagnoses: Principal Problem:   Acute CHF (congestive heart failure) (HCC) Active Problems:   Atrial fibrillation    Tachycardia induced cardiomyopathy (HCC)   DM2 (diabetes mellitus, type 2) (HCC)   Dyslipidemia   Chronic anticoagulation   Essential hypertension, benign   Acute on chronic HFrEF (heart failure with reduced ejection fraction) ([place])   Cerebrovascular accident (CVA) due to 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)   Gastroparesis   Thyroid nodule   Elevated troponin   Chest pain   CHF (congestive heart failure) (HCC)   NSTEMI (non-ST elevated myocardial infarction) ([place])  Resolved Problems:   * No resolved hospital problems. [facility] Course: [name] was admitted to the hospital with the working diagnosis of acute on chronic diastolic heart failure exacerbation.   61 yo female with the past medical history of hypertension, T2DM, atrial fibrillation and dyslipidemia who presented with dyspnea. Her symptoms started 4 days prior to admission with intermittent chest pain, associated with dyspnea. Worse symptoms on exertion that prompted her to come to the ED. On her initial physical examination her blood pressure was 121/75, HR 75, RR 22 and 02 saturation 93%. Lungs  with no wheezing or rales, heart with S1 and S2 present and rhythmic, abdomen not distended and no lower extremity edema.   Na 140, K 3,8 Cl 104 bicarbonate 26 glucose 181 bun 10 cr 0,65  High sensitive troponin 106 and 80  Wbc 7,9 hgb 14 plt 253   Chest radiograph with cardiomegaly, bilateral interstitial infiltrates, with cephalization of the vasculature.   CT chest, abdomen and pelvis, angiography with no evidence of dissection. Bilateral ground glass opacities. 3.5 cm right thyrpod nodule with calcification with recommendation follow up US (elective)   EKG 89 bpm, normal axis, normal intervals, sinus rhythm with PVC, no significant ST segment or T wave changes.   Patient was placed on furosemide for diuresis with improvement in her symptoms.  Echocardiogram with mild reduction in LV systolic function with EF 45 to 50%, mild LVH, septal, inferior apical hypokinesis. Preserved RV systolic function, no significant valvular disease.   07/25 cardiac catheterization 100% occulusion of the proximal mid LAD. Positive thrombus noted beyond the high grade obstruction in the proximal vessel.  Final recommendations to continue medical therapy and attempt PCI in case of refractory angina despite medical therapy.     Assessment and Plan: * NSTEMI (non-ST elevated myocardial infarction) [facility]) Patient was placed on heparin for anticoagulation.  Cardiac catheterization with positive coronary artery disease with positive thrombus at the LAD  Her symptoms improved with medical therapy, no further angina.  Patient was placed on full medical therapy and plan for follow up as outpatient.   Acute on chronic HFrEF (heart failure with reduced ejection fraction) ([place]) Patient with improvement in volume status Plan to  continue blood pressure control. Will need close follow up as outpatient.   Essential hypertension, benign Continue blood pressure control with metoprolol.   Atrial fibrillation  Continue  rate control with metoprolol and anticoagulation with rivaroxaban.   Cerebrovascular accident (CVA) due to 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) Continue blood pressure control, antiplatelet therapy and statin.   Type 2 diabetes mellitus with hyperlipidemia (HCC) Patient was placed on insulin sliding scale during her hospitalization for glucose cover and monitoring  At the time of her discharge will resume her oral antihyperglycemic regimen along with metformin.   Thyroid nodule Follow up as outpatient with elective [place]   Class 2 obesity Calculated BMI 37.8          Consultants: cardiology  Procedures performed: cardiac catheterization   Disposition: Home Diet recommendation:  Discharge Diet Orders (From admission, onward)     Start     Ordered   06/25/22 0000  Diet - low sodium heart healthy        06/25/22 1539           Cardiac diet DISCHARGE MEDICATION: Allergies as of 06/25/2022       Reactions   Jardiance [empagliflozin] Other (See Comments)   Yeast infections   Codeine Itching, Rash        Medication List     STOP taking these medications    metoprolol tartrate 25 MG tablet Commonly known as: LOPRESSOR   mupirocin ointment 2 % Commonly known as: BACTROBAN   omeprazole 20 MG capsule Commonly known as: PRILOSEC Replaced by: pantoprazole 40 MG tablet       TAKE these medications    acetaminophen 500 MG tablet Commonly known as: TYLENOL Take 1,000 mg by mouth every 6 (six) hours as needed for moderate pain.   atorvastatin 80 MG tablet Commonly known as: LIPITOR Take 1 tablet (80 mg total) by mouth daily. Start taking on: June 26, 2022 What changed:  medication strength how much to take   clopidogrel 75 MG tablet Commonly known as: PLAVIX Take 1 tablet (75 mg total) by mouth daily with breakfast. Start taking on: June 26, 2022   Dexcom G7 Receiver Hardie Pulley Use to monitor BG continuously   Dexcom G7 Sensor Misc CHANGE SENSOR  EVERY 10 DAYS   diclofenac Sodium 1 % Gel Commonly known as: VOLTAREN Apply 2 g topically 4 (four) times daily.   DULoxetine 30 MG capsule Commonly known as: CYMBALTA Take 1 capsule (30 mg total) by mouth daily.   glipiZIDE 5 MG 24 hr tablet Commonly known as: GLUCOTROL XL Take 1 tablet (5 mg total) by mouth daily with breakfast.   isosorbide mononitrate 30 MG 24 hr tablet Commonly known as: IMDUR Take 1 tablet (30 mg total) by mouth daily. Start taking on: June 26, 2022   lisinopril 10 MG tablet Commonly known as: ZESTRIL Take 1 tablet (10 mg total) by mouth daily.   metFORMIN 1000 MG tablet Commonly known as: GLUCOPHAGE Take 1 tablet (1,000 mg total) by mouth 2 (two) times daily with a meal.   metoCLOPramide 10 MG tablet Commonly known as: REGLAN Take 1 tablet (10 mg total) by mouth every 6 (six) hours. What changed:  when to take this reasons to take this   metoprolol succinate 25 MG 24 hr tablet Commonly known as: TOPROL-XL Take 3 tablets (75 mg total) by mouth daily. Start taking on: June 26, 2022   OneTouch Delica Lancets 33G Misc Use as instructed to check blood sugar  up to 3 times daily.   OneTouch Verio test strip Generic drug: glucose blood USE STRIPS TO CHECK GLUCOSE UP TO THREE TIMES DAILY AS DIRECTED   Ozempic (2 MG/DOSE) 8 MG/3ML Sopn Generic drug: Semaglutide (2 MG/DOSE) INJECT 2MG  SUBCUTANEOUSLY ONCE A WEEK What changed:  how much to take how to take this when to take this additional instructions   pantoprazole 40 MG tablet Commonly known as: PROTONIX Take 1 tablet (40 mg total) by mouth daily. Start taking on: June 26, 2022 Replaces: omeprazole 20 MG capsule   rivaroxaban 20 MG Tabs tablet Commonly known as: Xarelto Take 1 tablet (20 mg total) by mouth daily with supper.   Semglee (yfgn) 100 UNIT/ML Pen Generic drug: insulin glargine-yfgn INJECT 80 UNITS SUBCUTANEOUSLY  AT BEDTIME What changed: See the new instructions.   Skyrizi  Pen 150 MG/ML Soaj Generic drug: Risankizumab-rzaa Inject 150 mg into the skin every 3 (three) months.   TRUEplus Pen Needles 32G X 4 MM Misc Generic drug: Insulin Pen Needle USE AS DIRECTED AT BEDTIME.        Follow-up Information     Swinyer, Lanice Schwab, NP Follow up on 07/02/2022.   Specialty: Nurse Practitioner Why: at 10am for your follow up appt with Kelly. Lanny Hurst' NP Natasha Mead information: [place] [address] [place] [id number] [phone]                Discharge Exam: Danley Danker Weights   06/21/22 1612 06/23/22 0455 06/25/22 0344  Weight: 104.3 kg 102.9 kg 103.1 kg   BP (!) 99/59 (BP Location: Left Arm)   Pulse 69   Temp 98.3 F (36.8 C) (Oral)   Resp 19   Ht 5\' 5"  (1.651 m)   Wt 103.1 kg   LMP 04/15/2015 (LMP Unknown) Comment: last in may  SpO2 95%   BMI 37.82 kg/m   Patient with no chest pain or dyspnea.  Neurology awake and alert EMT with no pallor Cardiovascular with S1 and S2 present and rhythmic Respiratory with no rales or wheezing Abdomen not distended No lower extremity edema   Condition at discharge: stable  The results of significant diagnostics from this hospitalization (including imaging, microbiology, ancillary and laboratory) are listed below for reference.   Imaging Studies: CARDIAC CATHETERIZATION  Result Date: 06/24/2022 CONCLUSIONS: 100% occlusion of the proximal to mid LAD within the heavily calcified segment.  Right to left collaterals fill the LAD retrograde.  Thrombus is noted beyond the high-grade obstruction in the proximal vessel. Widely patent left main, circumflex, and right coronary Mild reduction in anteroapical contractility.  EF 45 to 50%.  EDP 5 mmHg. RECOMMENDATIONS: IV heparin rather than Xarelto until a final decision concerning treatment strategy is made. Start anti-ischemic therapy. Patient is loaded with Plavix. Discussed with Kelly. [place].  Consider repeat attempt at PCI after several days to allow  thrombus resolution versus medical therapy for 6 weeks returning to the CTO team for consideration of PCI that would likely include plaque modification with orbital atherectomy.   ECHOCARDIOGRAM COMPLETE  Result Date: 06/23/2022    ECHOCARDIOGRAM REPORT   Patient Name:   KADYNCE ION Date of Exam: 06/23/2022 Medical Rec #:  [id number]      Height:       65.0 in Accession #:    [id number]     Weight:       226.9 lb Date of Birth:  [birth year]      BSA:  2.087 m Patient Age:    5 years       BP:           110/72 mmHg Patient Gender: F              HR:           74 bpm. Exam Location:  Inpatient Procedure: 2D Echo, Cardiac Doppler and Color Doppler Indications:    CHF  History:        Patient has no prior history of Echocardiogram examinations.                 Arrythmias:Atrial Fibrillation; Risk Factors:Hypertension,                 Diabetes and Dyslipidemia.  Sonographer:    Jyl Heinz Referring Phys: [id number] [place]  1. Septal , inferior apical hypokinesis . Left ventricular ejection fraction, by estimation, is 45 to 50%. The left ventricle has mildly decreased function. The left ventricle demonstrates regional wall motion abnormalities (see scoring diagram/findings  for description). There is mild left ventricular hypertrophy. Left ventricular diastolic parameters were normal.  2. Right ventricular systolic function is normal. The right ventricular size is normal.  3. Left atrial size was mildly dilated.  4. The mitral valve is abnormal. No evidence of mitral valve regurgitation. No evidence of mitral stenosis.  5. The aortic valve is tricuspid. There is mild calcification of the aortic valve. Aortic valve regurgitation is not visualized. Aortic valve sclerosis is present, with no evidence of aortic valve stenosis.  6. Aortic dilatation noted. There is dilatation of the aortic root, measuring 38 mm.  7. The inferior vena cava is normal in size with greater than 50% respiratory  variability, suggesting right atrial pressure of 3 mmHg. FINDINGS  Left Ventricle: Septal , inferior apical hypokinesis. Left ventricular ejection fraction, by estimation, is 45 to 50%. The left ventricle has mildly decreased function. The left ventricle demonstrates regional wall motion abnormalities. The left ventricular internal cavity size was normal in size. There is mild left ventricular hypertrophy. Left ventricular diastolic parameters were normal. Right Ventricle: The right ventricular size is normal. No increase in right ventricular wall thickness. Right ventricular systolic function is normal. Left Atrium: Left atrial size was mildly dilated. Right Atrium: Right atrial size was normal in size. Pericardium: There is no evidence of pericardial effusion. Mitral Valve: The mitral valve is abnormal. There is mild thickening of the mitral valve leaflet(s). There is mild calcification of the mitral valve leaflet(s). Mild mitral annular calcification. No evidence of mitral valve regurgitation. No evidence of mitral valve stenosis. Tricuspid Valve: The tricuspid valve is normal in structure. Tricuspid valve regurgitation is not demonstrated. No evidence of tricuspid stenosis. Aortic Valve: The aortic valve is tricuspid. There is mild calcification of the aortic valve. Aortic valve regurgitation is not visualized. Aortic valve sclerosis is present, with no evidence of aortic valve stenosis. Aortic valve peak gradient measures 7.6 mmHg. Pulmonic Valve: The pulmonic valve was normal in structure. Pulmonic valve regurgitation is trivial. No evidence of pulmonic stenosis. Aorta: Aortic dilatation noted. There is dilatation of the aortic root, measuring 38 mm. Venous: The inferior vena cava is normal in size with greater than 50% respiratory variability, suggesting right atrial pressure of 3 mmHg. IAS/Shunts: No atrial level shunt detected by color flow Doppler.  LEFT VENTRICLE PLAX 2D LVIDd:         4.50 cm       Diastology LVIDs:  3.20 cm      LV e' medial:    4.24 cm/s LV PW:         1.20 cm      LV E/e' medial:  13.3 LV IVS:        1.20 cm      LV e' lateral:   6.20 cm/s LVOT diam:     2.00 cm      LV E/e' lateral: 9.1 LV SV:         43 LV SV Index:   21 LVOT Area:     3.14 cm  LV Volumes (MOD) LV vol d, MOD A2C: 106.0 ml LV vol d, MOD A4C: 120.0 ml LV vol s, MOD A2C: 47.5 ml LV vol s, MOD A4C: 64.0 ml LV SV MOD A2C:     58.5 ml LV SV MOD A4C:     120.0 ml LV SV MOD BP:      55.0 ml RIGHT VENTRICLE             IVC RV Basal diam:  2.70 cm     IVC diam: 1.50 cm RV Mid diam:    2.40 cm RV S prime:     10.20 cm/s TAPSE (M-mode): 1.6 cm LEFT ATRIUM             Index        RIGHT ATRIUM           Index LA diam:        3.80 cm 1.82 cm/m   RA Area:     12.80 cm LA Vol (A2C):   47.1 ml 22.57 ml/m  RA Volume:   28.90 ml  13.85 ml/m LA Vol (A4C):   49.1 ml 23.53 ml/m LA Biplane Vol: 50.8 ml 24.34 ml/m  AORTIC VALVE AV Area (Vmax): 1.72 cm AV Vmax:        138.00 cm/s AV Peak Grad:   7.6 mmHg LVOT Vmax:      75.70 cm/s LVOT Vmean:     55.300 cm/s LVOT VTI:       0.138 m  AORTA Ao Root diam: 3.80 cm Ao Asc diam:  3.50 cm MITRAL VALVE               TRICUSPID VALVE MV Area (PHT): 4.26 cm    TR Peak grad:   21.3 mmHg MV Decel Time: 178 msec    TR Vmax:        231.00 cm/s MV E velocity: 56.60 cm/s MV A velocity: 51.00 cm/s  SHUNTS MV E/A ratio:  1.11        Systemic VTI:  0.14 m                            Systemic Diam: 2.00 cm Jenkins Rouge MD Electronically signed by Jenkins Rouge MD Signature Date/Time: 06/23/2022/11:48:55 AM    Final    CT Angio Chest/Abd/Pel for Dissection W and/or W/WO  Result Date: 06/21/2022 CLINICAL DATA:  Chest pain or back pain with suspected aortic dissection. EXAM: CT ANGIOGRAPHY CHEST, ABDOMEN AND PELVIS TECHNIQUE: Non-contrast CT of the chest was initially obtained. Multidetector CT imaging through the chest, abdomen and pelvis was performed using the standard protocol during bolus  administration of intravenous contrast. Multiplanar reconstructed images and MIPs were obtained and reviewed to evaluate the vascular anatomy. RADIATION DOSE REDUCTION: This exam was performed according to the departmental dose-optimization program which includes automated exposure control,  adjustment of the mA and/or kV according to patient size and/or use of iterative reconstruction technique. CONTRAST:  164mL OMNIPAQUE IOHEXOL 350 MG/ML SOLN COMPARISON:  CT abdomen and pelvis 04/29/2022 FINDINGS: CTA CHEST FINDINGS Cardiovascular: Noncontrast images of the chest demonstrate a few scattered calcifications in the aorta. Moderate calcification in the coronary arteries. No evidence of intramural hematoma. Images obtained during arterial phase after contrast administration demonstrate normal caliber thoracic aorta. No aortic dissection. Great vessel origins are patent. Normal heart size. No pericardial effusions. Central pulmonary arteries are well opacified without evidence of significant pulmonary embolus. Mediastinum/Nodes: Hypodense mass with calcification in the right thyroid gland measuring 3.5 cm diameter. Esophagus is decompressed. No significant lymphadenopathy. Lungs/Pleura: Small bilateral pleural effusions. Diffuse interstitial changes in the lungs likely represent edema although chronic fibrosis could also have this appearance. Atelectasis in the lung bases. Musculoskeletal: No chest wall abnormality. No acute or significant osseous findings. Review of the MIP images confirms the above findings. CTA ABDOMEN AND PELVIS FINDINGS VASCULAR Aorta: Normal caliber aorta without aneurysm, dissection, vasculitis or significant stenosis. Celiac: Patent without evidence of aneurysm, dissection, vasculitis or significant stenosis. SMA: Patent without evidence of aneurysm, dissection, vasculitis or significant stenosis. Renals: Both renal arteries are patent without evidence of aneurysm, dissection, vasculitis,  fibromuscular dysplasia or significant stenosis. IMA: Patent without evidence of aneurysm, dissection, vasculitis or significant stenosis. Inflow: Patent without evidence of aneurysm, dissection, vasculitis or significant stenosis. Veins: No obvious venous abnormality within the limitations of this arterial phase study. Review of the MIP images confirms the above findings. NON-VASCULAR Hepatobiliary: No focal liver abnormality is seen. No gallstones, gallbladder wall thickening, or biliary dilatation. Pancreas: Unremarkable. No pancreatic ductal dilatation or surrounding inflammatory changes. Spleen: Normal in size without focal abnormality. Adrenals/Urinary Tract: Adrenal glands are unremarkable. Kidneys are normal, without renal calculi, focal lesion, or hydronephrosis. Bladder is unremarkable. Stomach/Bowel: Stomach is within normal limits. Appendix appears normal. No evidence of bowel wall thickening, distention, or inflammatory changes. Lymphatic: No significant lymphadenopathy. Reproductive: Uterus and bilateral adnexa are unremarkable. Other: No abdominal wall hernia or abnormality. No abdominopelvic ascites. Musculoskeletal: No acute or significant osseous findings. Review of the MIP images confirms the above findings. IMPRESSION: 1. No evidence of aortic aneurysm or dissection. Mild aortic atherosclerosis. 2. Small bilateral pleural effusions. Diffuse interstitial pattern to the lungs likely representing edema. 3. No acute process demonstrated in the abdomen or pelvis. No evidence of bowel obstruction or inflammation. 4. 3.5 cm diameter right thyroid nodule with calcification. Recommend follow-up with elective thyroid US (ref: J Am Coll Radiol. 2015 Feb;12(2): 143-50). Electronically Signed   By: Lucienne Capers M.D.   On: 06/21/2022 19:11   DG Chest 2 View  Result Date: 06/21/2022 CLINICAL DATA:  Chest pain, shortness of breath EXAM: CHEST - 2 VIEW COMPARISON:  09/14/2015 FINDINGS: The heart size and  mediastinal contours are within normal limits. Mild, diffuse bilateral interstitial pulmonary opacity. Disc degenerative disease of the thoracic spine. IMPRESSION: Mild, diffuse bilateral interstitial pulmonary opacity, likely mild edema. No focal airspace opacity. Electronically Signed   By: Delanna Ahmadi M.D.   On: 06/21/2022 16:48    Microbiology: Results for orders placed or performed in visit on 10/04/21  Microscopic Examination     Status: Abnormal   Collection Time: 10/04/21 10:21 AM   Urine  Result Value Ref Range Status   WBC, UA 6-10 (A) 0 - 5 /hpf Final   RBC, Urine 0-2 0 - 2 /hpf Final   Epithelial Cells (non renal)  0-10 0 - 10 /hpf Final   Casts Present (A) None seen /lpf Final   Cast Type Granular casts (A) N/A Final   Bacteria, UA None seen None seen/Few Final    Labs: CBC: Recent Labs  Lab 06/22/22 1504 06/23/22 0407 06/23/22 1711 06/24/22 0352 06/25/22 0511  WBC 7.9 7.1 8.3 9.2 6.8  NEUTROABS 4.8  --   --   --   --   HGB 14.0 13.8 14.6 15.0 13.5  HCT 42.4 41.7 43.0 44.5 39.7  MCV 97.2 96.8 94.7 96.9 95.2  PLT 253 232 268 256 234   Basic Metabolic Panel: Recent Labs  Lab 06/21/22 1615 06/22/22 1504 06/23/22 0407 06/23/22 1711 06/24/22 0352 06/25/22 0511  NA 137 140 140  --  136 136  K 4.5 3.8 4.2  --  4.0 4.8  CL 103 104 104  --  99 104  CO2 24 26 28   --  27 26  GLUCOSE 218* 181* 180*  --  232* 232*  BUN 12 10 13   --  22* 17  CREATININE 0.73 0.65 0.67 0.83 0.88 0.94  CALCIUM 9.3 9.0 9.5  --  9.3 8.9  MG  --   --   --   --  2.0 2.0   Liver Function Tests: Recent Labs  Lab 06/22/22 1504 06/23/22 0407  AST 16 17  ALT 26 26  ALKPHOS 55 54  BILITOT 0.7 0.7  PROT 7.2 7.3  ALBUMIN 3.5 3.5   CBG: Recent Labs  Lab 06/24/22 1136 06/24/22 1620 06/24/22 2128 06/25/22 0744 06/25/22 1202  GLUCAP 228* 245* 233* 226* 190*    Discharge time spent: greater than 30 minutes.  Signed: 06/27/22, MD [facility] 06/25/2022

## 2022-06-25 NOTE — Inpatient Diabetes Management (Signed)
Inpatient Diabetes Program Recommendations  AACE/ADA: New Consensus Statement on Inpatient Glycemic Control  Target Ranges:  Prepandial:   less than 140 mg/dL      Peak postprandial:   less than 180 mg/dL (1-2 hours)      Critically ill patients:  140 - 180 mg/dL    Latest Reference Range & Units 06/24/22 07:33 06/24/22 10:03 06/24/22 11:36 06/24/22 16:20 06/24/22 21:28 06/25/22 07:44  Glucose-Capillary 70 - 99 mg/dL 742 (H) 595 (H) 638 (H) 245 (H) 233 (H) 226 (H)   Review of Glycemic Control  Diabetes history: DM2 Outpatient Diabetes medications: Semglee 80 units QHS, Ozempic 2 mg Qweek, Glipizide XL 5 mg QAM, Metformin 1000 mg BID Current orders for Inpatient glycemic control: Semglee 20 units QHS, Novolog 0-15 units TID with meals, Novolog 0-5 units QHS   Inpatient Diabetes Program Recommendations:     Insulin: Please consider increasing Semglee to 22 units QHS and ordering Novolog 4 units TID with meals for meal coverage if patient eats at least 50% of meals.   Thanks, Orlando Penner, RN, MSN, CDCES Diabetes Coordinator Inpatient Diabetes Program [phone] (Team Pager from 8am to 5pm)

## 2022-06-25 NOTE — Progress Notes (Signed)
Patient discharging home. Vital signs stable at time of discharge as reflected in discharge summary. Discharge instructions given and verbal understanding returned. No questions at this time. Patient discharging medications from TOC.

## 2022-06-25 NOTE — Assessment & Plan Note (Signed)
Calculated BMI 37.8

## 2022-06-25 NOTE — Hospital Course (Signed)
Mrs. Engert was admitted to the hospital with the working diagnosis of acute on chronic diastolic heart failure exacerbation.   61 yo female with the past medical history of hypertension, T2DM, atrial fibrillation and dyslipidemia who presented with dyspnea. Her symptoms started 4 days prior to admission with intermittent chest pain, associated with dyspnea. Worse symptoms on exertion that prompted her to come to the ED. On her initial physical examination her blood pressure was 121/75, HR 75, RR 22 and 02 saturation 93%. Lungs with no wheezing or rales, heart with S1 and S2 present and rhythmic, abdomen not distended and no lower extremity edema.   Na 140, K 3,8 Cl 104 bicarbonate 26 glucose 181 bun 10 cr 0,65  High sensitive troponin 106 and 80  Wbc 7,9 hgb 14 plt 253   Chest radiograph with cardiomegaly, bilateral interstitial infiltrates, with cephalization of the vasculature.   CT chest, abdomen and pelvis, angiography with no evidence of dissection. Bilateral ground glass opacities. 3.5 cm right thyrpod nodule with calcification with recommendation follow up US (elective)   EKG 89 bpm, normal axis, normal intervals, sinus rhythm with PVC, no significant ST segment or T wave changes.   Patient was placed on furosemide for diuresis with improvement in her symptoms.  Echocardiogram with mild reduction in LV systolic function with EF 45 to 50%, mild LVH, septal, inferior apical hypokinesis. Preserved RV systolic function, no significant valvular disease.   07/25 cardiac catheterization 100% occulusion of the proximal mid LAD. Positive thrombus noted beyond the high grade obstruction in the proximal vessel.  Final recommendations to continue medical therapy and attempt PCI in case of refractory angina despite medical therapy.

## 2022-06-25 NOTE — Assessment & Plan Note (Signed)
Continue blood pressure control, antiplatelet therapy and statin  

## 2022-06-25 NOTE — Progress Notes (Signed)
ANTICOAGULATION CONSULT NOTE - Initial Consult  Pharmacy Consult for heparin Indication: chest pain/ACS  Allergies  Allergen Reactions   Jardiance [Empagliflozin] Other (See Comments)    Yeast infections   Codeine Itching and Rash    Patient Measurements: Height: 5\' 5"  (165.1 cm) Weight: 103.1 kg (227 lb 4.7 oz) IBW/kg (Calculated) : 57  HEPARIN DW (KG): 81.2 kg   Vital Signs: Temp: 98.6 F (37 C) (07/26 0344) Temp Source: Oral (07/26 0344) BP: 111/68 (07/26 0344) Pulse Rate: 71 (07/26 0344)  Labs: Recent Labs    06/23/22 1711 06/24/22 0352 06/24/22 2157 06/25/22 0511  HGB 14.6 15.0  --  13.5  HCT 43.0 44.5  --  39.7  PLT 268 256  --  234  APTT  --   --  28 36  HEPARINUNFRC  --   --  0.15* 0.23*  CREATININE 0.83 0.88  --  0.94    Estimated Creatinine Clearance: 76.7 mL/min (by C-G formula based on SCr of 0.94 mg/dL).   Medical History: Past Medical History:  Diagnosis Date   Arthritis    "all over" (06/30/2017)   Chicken pox    Dyslipidemia    Dysrhythmia ablation 2018   a-fib   GERD (gastroesophageal reflux disease)    H/O: hypothyroidism    a. thyroid biopsy 1996 with synthroid. Stopped taking rx and was loss to follow up b. normal thyroid function 10/20/13   High cholesterol    Hx of cardiovascular stress test    ETT/Lexiscan Myoview (12/2013):  No ischemia; not gated; low risk.   Hypertension    Obesity    Persistent atrial fibrillation (HCC)    a diagnosed 10/25/2013   Psoriasis    on Skyrizi   Seasonal allergies    Stroke [facility]) 2016   "some speech problems since" (06/30/2017)   Tachycardia induced cardiomyopathy (HCC)    a. 2/2 Afib with RVR for unknown duration (EF: 40-45%)   Thyroid disease    Type II diabetes mellitus (HCC)    a. hg A1c 10, newly diagnosed (10/26/13)    Medications:  Scheduled:   aspirin  81 mg Oral Daily   atorvastatin  80 mg Oral Daily   clopidogrel  75 mg Oral Q breakfast   DULoxetine  30 mg Oral Daily   insulin  aspart  0-15 Units Subcutaneous TID WC   insulin aspart  0-5 Units Subcutaneous QHS   insulin glargine-yfgn  20 Units Subcutaneous QHS   lisinopril  10 mg Oral Daily   metoprolol succinate  75 mg Oral Daily   pantoprazole  40 mg Oral Daily   sodium chloride flush  3 mL Intravenous Q12H    Assessment: 61 yom who presented with chest pain - on Xarelto for hx Afib (LD 7/23).   Underwent cardiac cath 7/25 finding 100% prox-mid LAD with thrombus noted and started heparin 2 hours after TR band removed (documented on 7/25@1400 ).   Heparin currently running at 1300 units/hr. Heparin level 0.23 and aPTT 36 both subtherapeutic. Second subtherapeutic levels obtained during infusion. Hgb 13.5, plt 234. No s/sx of bleeding noted in chart.    Goal of Therapy:  Heparin level 0.3-0.7 units/ml aPTT 66-102 seconds Monitor platelets by anticoagulation protocol: Yes   Plan:  Increase heparin rate to 1500 units/hr  Order heparin level and aPTT in 8 hrs Monitor daily HL, CBC, and for s/sx of bleeding  Continue to monitor H&H     8/25, PharmD PGY1 Pharmacy Resident   06/25/2022  7:37 AM

## 2022-06-25 NOTE — Assessment & Plan Note (Signed)
Continue rate control with metoprolol and anticoagulation with rivaroxaban.

## 2022-06-25 NOTE — Progress Notes (Signed)
CARDIAC REHAB PHASE I   PRE:  Rate/Rhythm: 81 SR  BP:  Sitting: 113/98     SaO2: 97 RA  MODE:  Ambulation: 450 ft   POST:  Rate/Rhythm: 96 SR  BP:  Sitting: 113/91    SaO2: 95 RA   Pt ambulated 457ft in hallway independently with steady gait. Pt denies CP, SOB, or dizziness throughout session. Pt returned to EOB. Pt educated on importance of Plavix and daily weights. Pt given heart healthy and diabetic diets. Reviewed site care, restrictions, and exercise guidelines. Will refer to CRP II [place].  [id number] Reynold Bowen, RN BSN 06/25/2022 10:22 AM

## 2022-06-25 NOTE — Progress Notes (Signed)
Progress Note  Patient Name: Kelly Lang Date of Encounter: 06/25/2022  [facility] Cardiologist: Kristeen Miss, MD   Subjective   Feeling well this morning. No further episodes of chest pain.   Inpatient Medications    Scheduled Meds:  aspirin  81 mg Oral Daily   atorvastatin  80 mg Oral Daily   clopidogrel  75 mg Oral Q breakfast   DULoxetine  30 mg Oral Daily   insulin aspart  0-15 Units Subcutaneous TID WC   insulin aspart  0-5 Units Subcutaneous QHS   insulin glargine-yfgn  20 Units Subcutaneous QHS   lisinopril  10 mg Oral Daily   metoprolol succinate  75 mg Oral Daily   pantoprazole  40 mg Oral Daily   sodium chloride flush  3 mL Intravenous Q12H   Continuous Infusions:  sodium chloride     heparin 1,500 Units/hr (06/25/22 0807)   PRN Meds: sodium chloride, acetaminophen, HYDROcodone-acetaminophen, nitroGLYCERIN, ondansetron (ZOFRAN) IV, ondansetron **OR** [DISCONTINUED] ondansetron (ZOFRAN) IV, sodium chloride flush   Vital Signs    Vitals:   06/24/22 1532 06/24/22 2008 06/25/22 0034 06/25/22 0344  BP: 98/69 (!) 99/50 116/76 111/68  Pulse: 72 72 69 71  Resp: 20 20 16 20   Temp: 97.6 F (36.4 C) 98.7 F (37.1 C) (!) 97.5 F (36.4 C) 98.6 F (37 C)  TempSrc: Oral Oral Oral Oral  SpO2: 92% 96% 98% 95%  Weight:    103.1 kg  Height:        Intake/Output Summary (Last 24 hours) at 06/25/2022 0924 Last data filed at 06/25/2022 0100 Gross per 24 hour  Intake 1192.87 ml  Output --  Net 1192.87 ml      06/25/2022    3:44 AM 06/23/2022    4:55 AM 06/21/2022    4:12 PM  Last 3 Weights  Weight (lbs) 227 lb 4.7 oz 226 lb 13.7 oz 230 lb  Weight (kg) 103.1 kg 102.9 kg 104.327 kg      Telemetry    Sinus Rhythm - Personally Reviewed  ECG    Sinus Rhythm 70 bpm, LAFB - Personally Reviewed  Physical Exam   GEN: No acute distress.   Neck: No JVD Cardiac: RRR, no murmurs, rubs, or gallops.  Respiratory: Clear to auscultation bilaterally. GI:  Soft, nontender, non-distended  MS: No edema; No deformity. Right radial cath site stable.  Neuro:  Nonfocal  Psych: Normal affect   Labs    High Sensitivity Troponin:   Recent Labs  Lab 06/21/22 1615 06/21/22 1755  TROPONINIHS 106* 80*     Chemistry Recent Labs  Lab 06/22/22 1504 06/23/22 0407 06/23/22 1711 06/24/22 0352 06/25/22 0511  NA 140 140  --  136 136  K 3.8 4.2  --  4.0 4.8  CL 104 104  --  99 104  CO2 26 28  --  27 26  GLUCOSE 181* 180*  --  232* 232*  BUN 10 13  --  22* 17  CREATININE 0.65 0.67 0.83 0.88 0.94  CALCIUM 9.0 9.5  --  9.3 8.9  MG  --   --   --  2.0 2.0  PROT 7.2 7.3  --   --   --   ALBUMIN 3.5 3.5  --   --   --   AST 16 17  --   --   --   ALT 26 26  --   --   --   ALKPHOS 55 54  --   --   --  BILITOT 0.7 0.7  --   --   --   GFRNONAA >60 >60 >60 >60 >60  ANIONGAP 10 8  --  10 6    Lipids  Recent Labs  Lab 06/25/22 0514  CHOL 170  TRIG 305*  HDL 25*  LDLCALC 84  CHOLHDL 6.8    Hematology Recent Labs  Lab 06/23/22 1711 06/24/22 0352 06/25/22 0511  WBC 8.3 9.2 6.8  RBC 4.54 4.59 4.17  HGB 14.6 15.0 13.5  HCT 43.0 44.5 39.7  MCV 94.7 96.9 95.2  MCH 32.2 32.7 32.4  MCHC 34.0 33.7 34.0  RDW 12.6 12.5 12.5  PLT 268 256 234   Thyroid No results for input(s): "TSH", "FREET4" in the last 168 hours.  BNP Recent Labs  Lab 06/21/22 1615  BNP 294.6*    DDimer No results for input(s): "DDIMER" in the last 168 hours.   Radiology    CARDIAC CATHETERIZATION  Result Date: 06/24/2022 CONCLUSIONS: 100% occlusion of the proximal to mid LAD within the heavily calcified segment.  Right to left collaterals fill the LAD retrograde.  Thrombus is noted beyond the high-grade obstruction in the proximal vessel. Widely patent left main, circumflex, and right coronary Mild reduction in anteroapical contractility.  EF 45 to 50%.  EDP 5 mmHg. RECOMMENDATIONS: IV heparin rather than Xarelto until a final decision concerning treatment strategy is  made. Start anti-ischemic therapy. Patient is loaded with Plavix. Discussed with Dr. Martinique.  Consider repeat attempt at PCI after several days to allow thrombus resolution versus medical therapy for 6 weeks returning to the CTO team for consideration of PCI that would likely include plaque modification with orbital atherectomy.   ECHOCARDIOGRAM COMPLETE  Result Date: 06/23/2022    ECHOCARDIOGRAM REPORT   Patient Name:   Kelly Lang Date of Exam: 06/23/2022 Medical Rec #:  [id number]      Height:       65.0 in Accession #:    [id number]     Weight:       226.9 lb Date of Birth:  16-Apr-1961      BSA:          2.087 m Patient Age:    61 years       BP:           110/72 mmHg Patient Gender: F              HR:           74 bpm. Exam Location:  Inpatient Procedure: 2D Echo, Cardiac Doppler and Color Doppler Indications:    CHF  History:        Patient has no prior history of Echocardiogram examinations.                 Arrythmias:Atrial Fibrillation; Risk Factors:Hypertension,                 Diabetes and Dyslipidemia.  Sonographer:    Jyl Heinz Referring Phys: [id number] [place]  1. Septal , inferior apical hypokinesis . Left ventricular ejection fraction, by estimation, is 45 to 50%. The left ventricle has mildly decreased function. The left ventricle demonstrates regional wall motion abnormalities (see scoring diagram/findings  for description). There is mild left ventricular hypertrophy. Left ventricular diastolic parameters were normal.  2. Right ventricular systolic function is normal. The right ventricular size is normal.  3. Left atrial size was mildly dilated.  4. The mitral valve is abnormal. No evidence of mitral valve regurgitation. No  evidence of mitral stenosis.  5. The aortic valve is tricuspid. There is mild calcification of the aortic valve. Aortic valve regurgitation is not visualized. Aortic valve sclerosis is present, with no evidence of aortic valve stenosis.  6. Aortic  dilatation noted. There is dilatation of the aortic root, measuring 38 mm.  7. The inferior vena cava is normal in size with greater than 50% respiratory variability, suggesting right atrial pressure of 3 mmHg. FINDINGS  Left Ventricle: Septal , inferior apical hypokinesis. Left ventricular ejection fraction, by estimation, is 45 to 50%. The left ventricle has mildly decreased function. The left ventricle demonstrates regional wall motion abnormalities. The left ventricular internal cavity size was normal in size. There is mild left ventricular hypertrophy. Left ventricular diastolic parameters were normal. Right Ventricle: The right ventricular size is normal. No increase in right ventricular wall thickness. Right ventricular systolic function is normal. Left Atrium: Left atrial size was mildly dilated. Right Atrium: Right atrial size was normal in size. Pericardium: There is no evidence of pericardial effusion. Mitral Valve: The mitral valve is abnormal. There is mild thickening of the mitral valve leaflet(s). There is mild calcification of the mitral valve leaflet(s). Mild mitral annular calcification. No evidence of mitral valve regurgitation. No evidence of mitral valve stenosis. Tricuspid Valve: The tricuspid valve is normal in structure. Tricuspid valve regurgitation is not demonstrated. No evidence of tricuspid stenosis. Aortic Valve: The aortic valve is tricuspid. There is mild calcification of the aortic valve. Aortic valve regurgitation is not visualized. Aortic valve sclerosis is present, with no evidence of aortic valve stenosis. Aortic valve peak gradient measures 7.6 mmHg. Pulmonic Valve: The pulmonic valve was normal in structure. Pulmonic valve regurgitation is trivial. No evidence of pulmonic stenosis. Aorta: Aortic dilatation noted. There is dilatation of the aortic root, measuring 38 mm. Venous: The inferior vena cava is normal in size with greater than 50% respiratory variability, suggesting  right atrial pressure of 3 mmHg. IAS/Shunts: No atrial level shunt detected by color flow Doppler.  LEFT VENTRICLE PLAX 2D LVIDd:         4.50 cm      Diastology LVIDs:         3.20 cm      LV e' medial:    4.24 cm/s LV PW:         1.20 cm      LV E/e' medial:  13.3 LV IVS:        1.20 cm      LV e' lateral:   6.20 cm/s LVOT diam:     2.00 cm      LV E/e' lateral: 9.1 LV SV:         43 LV SV Index:   21 LVOT Area:     3.14 cm  LV Volumes (MOD) LV vol d, MOD A2C: 106.0 ml LV vol d, MOD A4C: 120.0 ml LV vol s, MOD A2C: 47.5 ml LV vol s, MOD A4C: 64.0 ml LV SV MOD A2C:     58.5 ml LV SV MOD A4C:     120.0 ml LV SV MOD BP:      55.0 ml RIGHT VENTRICLE             IVC RV Basal diam:  2.70 cm     IVC diam: 1.50 cm RV Mid diam:    2.40 cm RV S prime:     10.20 cm/s TAPSE (M-mode): 1.6 cm LEFT ATRIUM  Index        RIGHT ATRIUM           Index LA diam:        3.80 cm 1.82 cm/m   RA Area:     12.80 cm LA Vol (A2C):   47.1 ml 22.57 ml/m  RA Volume:   28.90 ml  13.85 ml/m LA Vol (A4C):   49.1 ml 23.53 ml/m LA Biplane Vol: 50.8 ml 24.34 ml/m  AORTIC VALVE AV Area (Vmax): 1.72 cm AV Vmax:        138.00 cm/s AV Peak Grad:   7.6 mmHg LVOT Vmax:      75.70 cm/s LVOT Vmean:     55.300 cm/s LVOT VTI:       0.138 m  AORTA Ao Root diam: 3.80 cm Ao Asc diam:  3.50 cm MITRAL VALVE               TRICUSPID VALVE MV Area (PHT): 4.26 cm    TR Peak grad:   21.3 mmHg MV Decel Time: 178 msec    TR Vmax:        231.00 cm/s MV E velocity: 56.60 cm/s MV A velocity: 51.00 cm/s  SHUNTS MV E/A ratio:  1.11        Systemic VTI:  0.14 m                            Systemic Diam: 2.00 cm Jenkins Rouge MD Electronically signed by Jenkins Rouge MD Signature Date/Time: 06/23/2022/11:48:55 AM    Final     Cardiac Studies   Cath: 06/25/22  CONCLUSIONS: 100% occlusion of the proximal to mid LAD within the heavily calcified segment.  Right to left collaterals fill the LAD retrograde.  Thrombus is noted beyond the high-grade obstruction in  the proximal vessel. Widely patent left main, circumflex, and right coronary Mild reduction in anteroapical contractility.  EF 45 to 50%.  EDP 5 mmHg.   RECOMMENDATIONS:   IV heparin rather than Xarelto until a final decision concerning treatment strategy is made. Start anti-ischemic therapy. Patient is loaded with Plavix. Discussed with Dr. Martinique.  Consider repeat attempt at PCI after several days to allow thrombus resolution versus medical therapy for 6 weeks returning to the CTO team for consideration of PCI that would likely include plaque modification with orbital atherectomy.  Diagnostic Dominance: Right  Echo: 06/23/22  IMPRESSIONS     1. Septal , inferior apical hypokinesis . Left ventricular ejection  fraction, by estimation, is 45 to 50%. The left ventricle has mildly  decreased function. The left ventricle demonstrates regional wall motion  abnormalities (see scoring diagram/findings   for description). There is mild left ventricular hypertrophy. Left  ventricular diastolic parameters were normal.   2. Right ventricular systolic function is normal. The right ventricular  size is normal.   3. Left atrial size was mildly dilated.   4. The mitral valve is abnormal. No evidence of mitral valve  regurgitation. No evidence of mitral stenosis.   5. The aortic valve is tricuspid. There is mild calcification of the  aortic valve. Aortic valve regurgitation is not visualized. Aortic valve  sclerosis is present, with no evidence of aortic valve stenosis.   6. Aortic dilatation noted. There is dilatation of the aortic root,  measuring 38 mm.   7. The inferior vena cava is normal in size with greater than 50%  respiratory variability, suggesting right atrial pressure of  3 mmHg.   FINDINGS   Left Ventricle: Septal , inferior apical hypokinesis. Left ventricular  ejection fraction, by estimation, is 45 to 50%. The left ventricle has  mildly decreased function. The left ventricle  demonstrates regional wall  motion abnormalities. The left  ventricular internal cavity size was normal in size. There is mild left  ventricular hypertrophy. Left ventricular diastolic parameters were  normal.   Right Ventricle: The right ventricular size is normal. No increase in  right ventricular wall thickness. Right ventricular systolic function is  normal.   Left Atrium: Left atrial size was mildly dilated.   Right Atrium: Right atrial size was normal in size.   Pericardium: There is no evidence of pericardial effusion.   Mitral Valve: The mitral valve is abnormal. There is mild thickening of  the mitral valve leaflet(s). There is mild calcification of the mitral  valve leaflet(s). Mild mitral annular calcification. No evidence of mitral  valve regurgitation. No evidence of  mitral valve stenosis.   Tricuspid Valve: The tricuspid valve is normal in structure. Tricuspid  valve regurgitation is not demonstrated. No evidence of tricuspid  stenosis.   Aortic Valve: The aortic valve is tricuspid. There is mild calcification  of the aortic valve. Aortic valve regurgitation is not visualized. Aortic  valve sclerosis is present, with no evidence of aortic valve stenosis.  Aortic valve peak gradient measures  7.6 mmHg.   Pulmonic Valve: The pulmonic valve was normal in structure. Pulmonic valve  regurgitation is trivial. No evidence of pulmonic stenosis.   Aorta: Aortic dilatation noted. There is dilatation of the aortic root,  measuring 38 mm.   Venous: The inferior vena cava is normal in size with greater than 50%  respiratory variability, suggesting right atrial pressure of 3 mmHg.   IAS/Shunts: No atrial level shunt detected by color flow Doppler.   Patient Profile     61 y.o. female with a hx of PAF, chronic anticoagulation, chronic systolic heart failure, DM2, CVA, HLD, HTN, thyroid nodule, gastroparesis, and chronic pain who was seen 06/23/2022 for the evaluation of  chest pain and DOE at the request of Dr. Sherryll Burger.  Assessment & Plan    NSTEMI Chest pain --Presented with chest pain.  High-sensitivity troponin 103>> 80.  EKG with septal Q waves noted on prior tracings.  Underwent cardiac catheterization noted above with totally occluded proximal/mid LAD and a heavily calcified segment with right to left collaterals, TIMI grade II flow visible thrombus noted distal to the total occlusion.  Able to cross lesion with wire but unsuccessful balloon angioplasty across the occlusion.  Recommendations for medical therapy and she was loaded with Plavix.  No recurrent chest pain.  Cardiac rehab to see him this morning. -- Resume Xarelto, continue Plavix, metoprolol XL 75 mg daily, lisinopril 10 mg daily. Add Imdur 30mg  daily -- Recommendations to consider CTO PCI in 4 to 6 weeks if no significant improvement on medical therapy  DOE HFmrEF --Echocardiogram showed LVEF of 45 to 50%, septal and inferior apical hypokinesis with mild LVH, normal normal RV size and function.  Mildly dilated left atrium.  No significant valvular disease. --She has been diuresed with IV Lasix on admission with improvement in her dyspnea --Continue metoprolol XL 75 mg daily as well as lisinopril 10 mg daily  Paroxysmal atrial fibrillation: History of ablation, flecainide stopped.  Maintaining sinus rhythm --Resume Xarelto  We will arrange outpatient follow-up in the office for [facility] appointment.  For questions or updates, please contact [facility] Please consult [email or web address] for contact info under        Signed, Reino Bellis, NP  06/25/2022, 9:24 AM

## 2022-06-25 NOTE — Assessment & Plan Note (Signed)
Patient was placed on heparin for anticoagulation.  Cardiac catheterization with positive coronary artery disease with positive thrombus at the LAD  Her symptoms improved with medical therapy, no further angina.  Patient was placed on full medical therapy and plan for follow up as outpatient.

## 2022-06-25 NOTE — Assessment & Plan Note (Signed)
Follow up as outpatient with elective [place]

## 2022-06-25 NOTE — Assessment & Plan Note (Signed)
Patient with improvement in volume status Plan to continue blood pressure control. Will need close follow up as outpatient.

## 2022-06-25 NOTE — Assessment & Plan Note (Signed)
Continue blood pressure control with metoprolol.  

## 2022-06-25 NOTE — Progress Notes (Signed)
ANTICOAGULATION CONSULT NOTE - Follow Up Consult  Pharmacy Consult for heparin Indication:  CAD and Afib  Labs: Recent Labs    06/23/22 0407 06/23/22 1711 06/24/22 0352 06/24/22 2157  HGB 13.8 14.6 15.0  --   HCT 41.7 43.0 44.5  --   PLT 232 268 256  --   APTT  --   --   --  28  HEPARINUNFRC  --   --   --  0.15*  CREATININE 0.67 0.83 0.88  --     Assessment: 61yo female subtherapeutic on heparin after started post-cath; no infusion issues or signs of bleeding per RN.  Goal of Therapy:  Heparin level 0.3-0.7 units/ml aPTT 66-102 seconds   Plan:  Will increase heparin infusion by 4 units/kgABW/hr to 1300 units/hr and check level in 6 hours.    Vernard Gambles, PharmD, BCPS  06/25/2022,12:29 AM

## 2022-06-26 LAB — LIPOPROTEIN A (LPA): Lipoprotein (a): 274.2 nmol/L — ABNORMAL HIGH (ref <75.0)

## 2022-06-30 ENCOUNTER — Ambulatory Visit: Payer: 59 | Admitting: Internal Medicine

## 2022-07-01 NOTE — Progress Notes (Unsigned)
Cardiology Office Note:    Date:  07/02/2022   ID:  Kelly Lang, DOB February 12, 1961, MRN [id number]  PCP:  Ladell Pier, MD   [facility] Providers Cardiologist:  Mertie Moores, MD Electrophysiologist:  Thompson Grayer, MD     Referring MD: Ladell Pier, MD   Chief Complaint: follow-up CAD  History of Present Illness:    Kelly Lang is a pleasant 61 y.o. female with a hx of persistent atrial fibrillation on chronic anticoagulation, chronic HFpEF, hypertension, CVA, hyperlipidemia, type 2 diabetes, morbid obesity, and former tobacco abuse.   Diagnosed with atrial fibrillation 2014. Nuclear stress test negative for ischemia in 2015.  On flecainide 2015, stopped in 2018 with failure to maintain NSR. PVI ablation 06/30/2017.  CVA 09/2015 in the setting of Coumadin/flecainide noncompliance.  Her insurance was not paying for flecainide. Echo at that time with LVEF 20 to 25%, she was placed on Xarelto and metoprolol.  Seen in our office on 03/31/2022 by Dr. Acie Fredrickson at which time she was maintaining sinus rhythm. Seen by Tommye Standard, PA on 5/3 for EP follow-up. She was agreeable to continue to follow-up with Dr. Acie Fredrickson and with EP as needed.   Admission 7/22-7/26/23 exertional dyspnea, chest pain, nausea and vomiting. Cardiology consulted. She initially thought it was due to gastroparesis. BNP mildly elevated to 295. Hs tropoinin elevated to a peak of 106.  On EKG she had no acute ischemic changes but did have multiple PVCs.  Echo this admission revealed LVEF 45 to 50% with septal and apical inferior hypokinesis that was not present on echo 12/2017. Chest CT showed no pulmonary embolism but she did have moderate coronary atherosclerosis.  Nuclear stress test and ETT in the past were low risk but no previous definitive assessment of her coronaries. Q waves present on septal leads on EKG. Recommended to proceed with LHC by Dr. Oval Linsey which was performed on 06/24/2022.  LHC revealed 100%  occlusion of proximal to mid LAD within the heavily calcified segment.  Right to left collaterals fill the LAD retrograde.  Thrombus noted beyond the high-grade obstruction in the proximal vessel.  Widely patent left main, circumflex, and right coronary.  EF 45 to 50%, LVEDP 5 mmHg.  Consideration of repeat attempt at PCI after several days to allow thrombus resolution versus medical therapy for 6 weeks and return to CTO team for consideration of PCI.  Per Dr. Martinique on 06/25/2022 patient was asymptomatic on medical therapy. LAD collateralized from the RCA. Recommend continued medical therapy.  Indication for repeat attempted PCI would be refractory angina despite medical therapy.  Today, she is here with a friend for post-hospital follow-up. Feeling "tired,  weak and shaky." Having headaches that occur in the afternoon. 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 No chest pain. Gets a little out of breath walking. Felt a poking sensation in left chest for a short duration yesterday, no further episodes like this.  States that she has noticed her hands trembling since hospital discharge.  New medications for her are Imdur and Plavix.  Her metoprolol dose was increased.  Blood sugar readings have been within normal range during this time.  Has not been monitoring BP at home, but her friend is a Marine scientist and has a cuff and will start to do so. She denies lower extremity edema, fatigue, palpitations, melena, hematuria, hemoptysis, diaphoresis, presyncope, syncope, orthopnea, and PND.  Past Medical History:  Diagnosis Date  Arthritis    "all over" (06/30/2017)   Chicken pox    Dyslipidemia    Dysrhythmia ablation 2018   a-fib   GERD (gastroesophageal reflux disease)    H/O: hypothyroidism    a. thyroid biopsy 1996 with synthroid. Stopped taking rx and was loss to follow up b. normal thyroid function 10/20/13   High cholesterol    Hx of  cardiovascular stress test    ETT/Lexiscan Myoview (12/2013):  No ischemia; not gated; low risk.   Hypertension    Obesity    Persistent atrial fibrillation (HCC)    a diagnosed 10/25/2013   Psoriasis    on Skyrizi   Seasonal allergies    Stroke [facility]) 2016   "some speech problems since" (06/30/2017)   Tachycardia induced cardiomyopathy (HCC)    a. 2/2 Afib with RVR for unknown duration (EF: 40-45%)   Thyroid disease    Type II diabetes mellitus (HCC)    a. hg A1c 10, newly diagnosed (10/26/13)    Past Surgical History:  Procedure Laterality Date   ATRIAL FIBRILLATION ABLATION  06/30/2017   ATRIAL FIBRILLATION ABLATION N/A 06/30/2017   Procedure: Atrial Fibrillation Ablation;  Surgeon: Hillis Range, MD;  Location: [facility];  Service: Cardiovascular;  Laterality: N/A;   BICEPT TENODESIS Right 05/24/2020   Procedure: BICEPS TENODESIS;  Surgeon: Cammy Copa, MD;  Location: [facility];  Service: Orthopedics;  Laterality: Right;   BIOPSY THYROID  1996   CARDIOVERSION N/A 12/07/2013   Procedure: CARDIOVERSION;  Surgeon: Everette Rank, MD;  Location: [facility] ENDOSCOPY;  Service: Cardiovascular;  Laterality: N/A;   CARDIOVERSION N/A 03/10/2016   Procedure: CARDIOVERSION;  Surgeon: Vesta Mixer, MD;  Location: [facility] ENDOSCOPY;  Service: Cardiovascular;  Laterality: N/A;   CORONARY BALLOON ANGIOPLASTY N/A 06/24/2022   Procedure: CORONARY BALLOON ANGIOPLASTY;  Surgeon: Lyn Records, MD;  Location: [facility];  Service: Cardiovascular;  Laterality: N/A;   ESOPHAGOGASTRODUODENOSCOPY N/A 12/07/2017   Procedure: ESOPHAGOGASTRODUODENOSCOPY (EGD);  Surgeon: Hilarie Fredrickson, MD;  Location[facility];  Service: Endoscopy;  Laterality: N/A;   EYE MUSCLE SURGERY Right ~ 1966   LEFT HEART CATH AND CORONARY ANGIOGRAPHY N/A 06/24/2022   Procedure: LEFT HEART CATH AND CORONARY ANGIOGRAPHY;  Surgeon: Lyn Records, MD;  Location: [facility];  Service: Cardiovascular;   Laterality: N/A;   SHOULDER ARTHROSCOPY WITH ROTATOR CUFF REPAIR Right 05/24/2020   Procedure: right shoulder arthroscopy, rotator cuff tear repair biceps tenodesis;  Surgeon: Cammy Copa, MD;  Location: [facility];  Service: Orthopedics;  Laterality: Right;   TEE WITHOUT CARDIOVERSION N/A 06/29/2017   Procedure: TRANSESOPHAGEAL ECHOCARDIOGRAM (TEE);  Surgeon: Pricilla Riffle, MD;  Location: [facility] ENDOSCOPY;  Service: Cardiovascular;  Laterality: N/A;   TONSILLECTOMY  1971    Current Medications: Current Meds  Medication Sig   acetaminophen (TYLENOL) 500 MG tablet Take 1,000 mg by mouth every 6 (six) hours as needed for moderate pain.   atorvastatin (LIPITOR) 80 MG tablet Take 1 tablet (80 mg total) by mouth daily.   clopidogrel (PLAVIX) 75 MG tablet Take 1 tablet (75 mg total) by mouth daily with breakfast.   Continuous Blood Gluc Receiver (DEXCOM G7 RECEIVER) DEVI Use to monitor BG continuously   Continuous Blood Gluc Sensor (DEXCOM G7 SENSOR) MISC CHANGE SENSOR EVERY 10 DAYS   diclofenac Sodium (VOLTAREN) 1 % GEL Apply 2 g topically 4 (four) times daily.   DULoxetine (CYMBALTA) 30 MG capsule Take 1 capsule (30  mg total) by mouth daily.   glipiZIDE (GLUCOTROL XL) 5 MG 24 hr tablet Take 1 tablet (5 mg total) by mouth daily with breakfast.   isosorbide mononitrate (IMDUR) 30 MG 24 hr tablet Take 1 tablet (30 mg total) by mouth daily.   lisinopril (ZESTRIL) 10 MG tablet Take 1 tablet (10 mg total) by mouth daily.   metFORMIN (GLUCOPHAGE) 1000 MG tablet Take 1 tablet (1,000 mg total) by mouth 2 (two) times daily with a meal.   metoCLOPramide (REGLAN) 10 MG tablet Take 1 tablet (10 mg total) by mouth every 6 (six) hours. (Patient taking differently: Take 10 mg by mouth every 6 (six) hours as needed (gastroparesis).)   metoprolol succinate (TOPROL-XL) 25 MG 24 hr tablet Take 3 tablets (75 mg total) by mouth daily.   ONETOUCH DELICA LANCETS 33G MISC Use as instructed to check blood  sugar up to 3 times daily.   ONETOUCH VERIO test strip USE STRIPS TO CHECK GLUCOSE UP TO THREE TIMES DAILY AS DIRECTED   pantoprazole (PROTONIX) 40 MG tablet Take 1 tablet (40 mg total) by mouth daily.   rivaroxaban (XARELTO) 20 MG TABS tablet Take 1 tablet (20 mg total) by mouth daily with supper.   Semaglutide, 2 MG/DOSE, (OZEMPIC, 2 MG/DOSE,) 8 MG/3ML SOPN INJECT 2MG  SUBCUTANEOUSLY ONCE A WEEK (Patient taking differently: Inject 2 mg into the skin once a week.)   SEMGLEE, YFGN, 100 UNIT/ML Pen INJECT 80 UNITS SUBCUTANEOUSLY  AT BEDTIME (Patient taking differently: Inject 80 Units into the skin at bedtime.)   SKYRIZI PEN 150 MG/ML SOAJ Inject 150 mg into the skin every 3 (three) months.   TRUEPLUS PEN NEEDLES 32G X 4 MM MISC USE AS DIRECTED AT BEDTIME.     Allergies:   Jardiance [empagliflozin] and Codeine   Social History   Socioeconomic History   Marital status: Divorced    Spouse name: Not on file   Number of children: 0   Years of education: 14   Highest education level: Not on file  Occupational History   Occupation:  Tobacco Use   Smoking status: Former    Packs/day: 1.00    Years: 22.00    Total pack years: 22.00    Types: Cigarettes    Quit date: 12/01/2010    Years since quitting: 11.5   Smokeless tobacco: Never  Vaping Use   Vaping Use: Never used  Substance and Sexual Activity   Alcohol use: Yes    Comment: social   Drug use: No   Sexual activity: Not Currently  Other Topics Concern   Not on file  Social History Narrative   [place] from [place] in 2002.     Fun: shop, sew, decorate   Denies any beliefs effecting healthcare   Social Determinants of Health   Financial Resource Strain: Not on file  Food Insecurity: Not on file  Transportation Needs: Not on file  Physical Activity: Not on file  Stress: Not on file  Social Connections: Not on file     Family History: The patient's family history includes Hyperlipidemia in  her mother; Hypertension in her mother. She was adopted.  ROS:   Please see the history of present illness.    + weakness + shaking + fatigue + headache All other systems reviewed and are negative.  Labs/Other Studies Reviewed:    The following studies were reviewed today:  Echo 06/23/22   1. Septal , inferior apical hypokinesis . Left ventricular ejection  fraction, by estimation, is 45 to 50%. The left ventricle has mildly  decreased function. The left ventricle demonstrates regional wall motion  abnormalities (see scoring diagram/findings   for description). There is mild left ventricular hypertrophy. Left  ventricular diastolic parameters were normal.   2. Right ventricular systolic function is normal. The right ventricular  size is normal.   3. Left atrial size was mildly dilated.   4. The mitral valve is abnormal. No evidence of mitral valve  regurgitation. No evidence of mitral stenosis.   5. The aortic valve is tricuspid. There is mild calcification of the  aortic valve. Aortic valve regurgitation is not visualized. Aortic valve  sclerosis is present, with no evidence of aortic valve stenosis.   6. Aortic dilatation noted. There is dilatation of the aortic root,  measuring 38 mm.   7. The inferior vena cava is normal in size with greater than 50%  respiratory variability, suggesting right atrial pressure of 3 mmHg.   LHC 06/24/22  CONCLUSIONS: 100% occlusion of the proximal to mid LAD within the heavily calcified segment.  Right to left collaterals fill the LAD retrograde.  Thrombus is noted beyond the high-grade obstruction in the proximal vessel. Widely patent left main, circumflex, and right coronary Mild reduction in anteroapical contractility.  EF 45 to 50%.  EDP 5 mmHg.   RECOMMENDATIONS:   IV heparin rather than Xarelto until a final decision concerning treatment strategy is made. Start anti-ischemic therapy. Patient is loaded with Plavix. Discussed with  Dr. Martinique.  Consider repeat attempt at PCI after several days to allow thrombus resolution versus medical therapy for 6 weeks returning to the CTO team for consideration of PCI that would likely include plaque modification with orbital atherectomy.  Diagnostic Dominance: Right    Recent Labs: 06/21/2022: B Natriuretic Peptide 294.6 06/23/2022: ALT 26 06/25/2022: BUN 17; Creatinine, Ser 0.94; Hemoglobin 13.5; Magnesium 2.0; Platelets 234; Potassium 4.8; Sodium 136  Recent Lipid Panel    Component Value Date/Time   CHOL 170 06/25/2022 0514   CHOL 149 01/21/2022 0935   TRIG 305 (H) 06/25/2022 0514   HDL 25 (L) 06/25/2022 0514   HDL 37 (L) 01/21/2022 0935   CHOLHDL 6.8 06/25/2022 0514   VLDL 61 (H) 06/25/2022 0514   LDLCALC 84 06/25/2022 0514   LDLCALC 78 01/21/2022 0935   LDLDIRECT 114.0 02/13/2015 0750     Risk Assessment/Calculations:     CHA2DS2-VASc Score = 6  The patient's score is based upon: CHF History: 1 HTN History: 1 Diabetes History: 1 Stroke History: 2 Vascular Disease History: 0 Age Score: 0 Gender Score: 1      Physical Exam:    VS:  BP 130/72   Pulse 82   Ht 5' 5.5" (1.664 m)   Wt 231 lb (104.8 kg)   LMP 04/15/2015 (LMP Unknown) Comment: last in may  SpO2 96%   BMI 37.86 kg/m     Wt Readings from Last 3 Encounters:  07/02/22 231 lb (104.8 kg)  06/25/22 227 lb 4.7 oz (103.1 kg)  05/21/22 230 lb 9.6 oz (104.6 kg)     GEN: Well developed, obese female in no acute distress HEENT: Normal NECK: No JVD; No carotid bruits CARDIAC: RRR, no murmurs, rubs, gallops RESPIRATORY:  Clear to auscultation without rales, wheezing or rhonchi  ABDOMEN: Soft, non-tender, non-distended MUSCULOSKELETAL:  No edema; No deformity. 2+ pedal pulses, equal bilaterally SKIN: Warm and dry. Right radial cath site well healed with minor bruising distal to wrist.  NEUROLOGIC:  Alert and oriented x 3 PSYCHIATRIC:  Normal affect   EKG:  EKG is ordered today.  The ekg  ordered today demonstrates NSR at 82 bpm, TWI aVL, V1-V2, Q waves septal leads, LAFB  Diagnoses:    1. Coronary artery disease involving native coronary artery of native heart without angina pectoris   2. Persistent atrial fibrillation (HCC)   3. Essential hypertension, benign   4. Hyperlipidemia LDL goal <70   5. Chronic anticoagulation   6. NSTEMI (non-ST elevated myocardial infarction) (HCC)   7. HFrEF (heart failure with reduced ejection fraction) (HCC)    Assessment and Plan:     CAD without angina: LHC 06/24/2022 revealed 100% occlusion of proximal and mid LAD with right to left collaterals. Consideration of PCI after several days to allow thrombus resolution versus medical therapy for 6 weeks. Final recommendation to continue medical therapy and attempt PCI in the case of refractory angina despite medical therapy. Has mild dyspnea with exertion, does not feel that this is significantly worse since hospital discharge. No further episodes of chest pain. Tolerating Imdur with mild headache in the afternoons. Reports hands shaking, appears quite anxious today.  Encouraged gradual resumption of regular activities and continue to monitor for worsening dyspnea or chest pain with increased activity. No bleeding concerns. Continue atorvastatin, Plavix, Imdur, lisinopril, metoprolol. No asa in the setting of chronic anticoagulation.  Advised her to notify us if she develops symptoms prior to next office visit. Follow-up in 2 months.   Hypertension: BP is well controlled.  I have asked her to monitor on a regular basis at home and notify us if BP is consistently > 140/80.   Atrial fibrillation on chronic anticoagulation: History of ablation. No AAD. Currently maintaining NSR. No bleeding concerns presently. Continue metoprolol, Xarelto.  HFmrEF: LVEF 45 to 50%, normal diastolic parameters, mild LVH, normal RV, no significant valve disease on echo 06/23/2022. Mild DOE. Has not been very active since  discharge. Appears euvolemic on exam, however body habitus makes it difficult to assess. No orthopnea, edema, PND.  Encouraged low-sodium, heart healthy diet.  Continue lisinopril, metoprolol.  Hyperlipidemia LDL goal < 70: LDL 84 on 06/25/22. Will recheck at next office visit. Continue atorvastatin.      Cardiac Rehabilitation Eligibility Assessment  The patient is ready to start cardiac rehabilitation from a cardiac standpoint.       Medication Adjustments/Labs and Tests Ordered: Current medicines are reviewed at length with the patient today.  Concerns regarding medicines are outlined above.  Orders Placed This Encounter  Procedures   EKG 12-Lead   No orders of the defined types were placed in this encounter.   Patient Instructions  Medication Instructions:   Your physician recommends that you continue on your current medications as directed. Please refer to the Current Medication list given to you today.   *If you need a refill on your cardiac medications before your next appointment, please call your pharmacy*   Lab Work:  None ordered.  If you have labs (blood work) drawn today and your tests are completely normal, you will receive your results only by: MyChart Message (if you have MyChart) OR A paper copy in the mail If you have any lab test that is abnormal or we need to change your treatment, we will call you to review the results.   Testing/Procedures:  None ordered.   Follow-Up: At [facility], you and your health needs are our priority.  As part of our continuing mission  to provide you with exceptional heart care, we have created designated Provider Care Teams.  These Care Teams include your primary Cardiologist (physician) and Advanced Practice Providers (APPs -  Physician Assistants and Nurse Practitioners) who all work together to provide you with the care you need, when you need it.  We recommend signing up for the patient portal called "MyChart".   Sign up information is provided on this After Visit Summary.  MyChart is used to connect with patients for Virtual Visits (Telemedicine).  Patients are able to view lab/test results, encounter notes, upcoming appointments, etc.  Non-urgent messages can be sent to your provider as well.   To learn more about what you can do with MyChart, go to [email or web address].    Your next appointment:   2 month(s)  The format for your next appointment:   In Person  Provider:   Mertie Moores, MD     Important Information About Sugar         Signed, Emmaline Life, NP  07/02/2022 4:36 PM    [place]

## 2022-07-02 ENCOUNTER — Encounter: Payer: Self-pay | Admitting: Nurse Practitioner

## 2022-07-02 ENCOUNTER — Ambulatory Visit (INDEPENDENT_AMBULATORY_CARE_PROVIDER_SITE_OTHER): Payer: 59 | Admitting: Nurse Practitioner

## 2022-07-02 VITALS — BP 130/72 | HR 82 | Ht 65.5 in | Wt 231.0 lb

## 2022-07-02 DIAGNOSIS — I5020 Unspecified systolic (congestive) heart failure: Secondary | ICD-10-CM

## 2022-07-02 DIAGNOSIS — I2510 Atherosclerotic heart disease of native coronary artery without angina pectoris: Secondary | ICD-10-CM | POA: Diagnosis not present

## 2022-07-02 DIAGNOSIS — Z7901 Long term (current) use of anticoagulants: Secondary | ICD-10-CM

## 2022-07-02 DIAGNOSIS — E785 Hyperlipidemia, unspecified: Secondary | ICD-10-CM

## 2022-07-02 DIAGNOSIS — I10 Essential (primary) hypertension: Secondary | ICD-10-CM

## 2022-07-02 DIAGNOSIS — I4819 Other persistent atrial fibrillation: Secondary | ICD-10-CM | POA: Diagnosis not present

## 2022-07-02 DIAGNOSIS — I214 Non-ST elevation (NSTEMI) myocardial infarction: Secondary | ICD-10-CM

## 2022-07-02 NOTE — Patient Instructions (Signed)
Medication Instructions:   Your physician recommends that you continue on your current medications as directed. Please refer to the Current Medication list given to you today.   *If you need a refill on your cardiac medications before your next appointment, please call your pharmacy*   Lab Work:  None ordered.  If you have labs (blood work) drawn today and your tests are completely normal, you will receive your results only by: MyChart Message (if you have MyChart) OR A paper copy in the mail If you have any lab test that is abnormal or we need to change your treatment, we will call you to review the results.   Testing/Procedures:  None ordered.   Follow-Up: At [facility], you and your health needs are our priority.  As part of our continuing mission to provide you with exceptional heart care, we have created designated Provider Care Teams.  These Care Teams include your primary Cardiologist (physician) and Advanced Practice Providers (APPs -  Physician Assistants and Nurse Practitioners) who all work together to provide you with the care you need, when you need it.  We recommend signing up for the patient portal called "MyChart".  Sign up information is provided on this After Visit Summary.  MyChart is used to connect with patients for Virtual Visits (Telemedicine).  Patients are able to view lab/test results, encounter notes, upcoming appointments, etc.  Non-urgent messages can be sent to your provider as well.   To learn more about what you can do with MyChart, go to [email or web address].    Your next appointment:   2 month(s)  The format for your next appointment:   In Person  Provider:   Kristeen Miss, MD     Important Information About Sugar

## 2022-07-13 ENCOUNTER — Other Ambulatory Visit: Payer: Self-pay | Admitting: Nurse Practitioner

## 2022-07-18 ENCOUNTER — Other Ambulatory Visit (HOSPITAL_COMMUNITY): Payer: Self-pay

## 2022-07-18 ENCOUNTER — Other Ambulatory Visit: Payer: Self-pay

## 2022-07-18 ENCOUNTER — Other Ambulatory Visit: Payer: Self-pay | Admitting: Cardiovascular Disease

## 2022-07-18 DIAGNOSIS — I4891 Unspecified atrial fibrillation: Secondary | ICD-10-CM

## 2022-07-18 MED ORDER — ISOSORBIDE MONONITRATE ER 30 MG PO TB24: 30.0000 mg | ORAL_TABLET | Freq: Every day | ORAL | 3 refills | Status: DC

## 2022-07-18 MED ORDER — ATORVASTATIN CALCIUM 80 MG PO TABS: 80.0000 mg | ORAL_TABLET | Freq: Every day | ORAL | 3 refills | Status: DC

## 2022-07-18 MED ORDER — CLOPIDOGREL BISULFATE 75 MG PO TABS: 75.0000 mg | ORAL_TABLET | Freq: Every day | ORAL | 3 refills | Status: DC

## 2022-07-18 MED ORDER — METOPROLOL SUCCINATE ER 25 MG PO TB24: 75.0000 mg | ORAL_TABLET | Freq: Every day | ORAL | 3 refills | Status: DC

## 2022-07-18 MED ORDER — RIVAROXABAN 20 MG PO TABS: 20.0000 mg | ORAL_TABLET | Freq: Every day | ORAL | 3 refills | Status: DC

## 2022-07-18 NOTE — Telephone Encounter (Signed)
Xarelto 20mg  refill request received. Pt is 61 years old, weight-104.8kg, Crea-0.94 on 06/25/2022, last seen by 06/27/2022, NP on 07/02/2022, Diagnosis-Afib, CrCl-106.82ml/min; Dose is appropriate based on dosing criteria. Will send in refill to requested pharmacy.

## 2022-07-21 ENCOUNTER — Other Ambulatory Visit: Payer: Self-pay | Admitting: Nurse Practitioner

## 2022-08-14 ENCOUNTER — Other Ambulatory Visit: Payer: Self-pay | Admitting: "Endocrinology

## 2022-08-31 ENCOUNTER — Other Ambulatory Visit: Payer: Self-pay | Admitting: "Endocrinology

## 2022-08-31 DIAGNOSIS — E1169 Type 2 diabetes mellitus with other specified complication: Secondary | ICD-10-CM

## 2022-09-04 ENCOUNTER — Telehealth (HOSPITAL_COMMUNITY): Payer: Self-pay

## 2022-09-04 NOTE — Telephone Encounter (Signed)
Pt insurance is active and benefits verified through [place]. Co-pay $0.00, DED $7,500.00/$7,500.00 met, out of pocket $7,500.00/$7,500.00 met, co-insurance 0%. No pre-authorization required. Cess/BCBS, 09/04/22 @ 4:05PM, DIY#[id number]   How many CR sessions are covered? (36 sessions for TCR, 72 sessions for ICR)ONLY 30 VISITS Is this a lifetime maximum or an annual maximum? Annual Has the member used any of these services to date? No Is there a time limit (weeks/months) on start of program and/or program completion? No

## 2022-09-05 ENCOUNTER — Encounter (HOSPITAL_COMMUNITY): Payer: Self-pay

## 2022-09-05 NOTE — Telephone Encounter (Signed)
Pt returned CR phone call and stated she is interested in CR.Patient stated yes. Patient will come in for orientation on 09/11/22 @ 8AM and will attend the 6:45AM exercise class. Went over insurance, patient verbalized understanding.    Tourist information centre manager.

## 2022-09-05 NOTE — Telephone Encounter (Signed)
Attempted to call patient in regards to Cardiac Rehab - LM on VM Mailed letter 

## 2022-09-07 ENCOUNTER — Encounter: Payer: Self-pay | Admitting: Cardiovascular Disease

## 2022-09-07 NOTE — Progress Notes (Unsigned)
Cardiology Office Note   Date:  09/07/2022   ID:  Kelly Lang, DOB 1961/08/16, MRN [id number]  PCP:  Marcine Matar, MD  Cardiologist:   Kristeen Miss, MD   Chief Complaint  Patient presents with   Atrial Fibrillation        1. Paroxysmal Atrial fibrillation 2. Hypothyroidism 3. Type 2 diabetes mellitus 4. Chronic systolic CHF    Kelly Lang is a 61 y.o. female with a hx of T2DM, HL, thyroid disease and atrial fibrillation.  She had been on Synthroid for some time after a thyroid bx in 1996.  She was admitted 10/2013 with AFib with RVR.  Echo (10/26/13):  EF 40-45%, normal wall motion, mild LAE.  CHADS2-VASc= 3 (LV dysfunction, DM, female).  She was placed on coumadin.  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  She was last seen 11/29/2013.  She was set up for DCCV after 3-4 weeks of appropriate anticoagulation.  DCCV was performed 12/07/2013 but was unsuccessful.  She returns for follow up.  She is stable. She is frustrated with her atrial fibrillation. She does get tired more easily. She denies chest pain. She denies significant dyspnea. She is NYHA class II. She denies syncope. She denies orthopnea, PND, edema, palpitations.  Feb. 16, 2015:  She had a cardioversion twice.  Converted back to Afib both times. She has had a stress myoview which was negative for ischemia.    we will start flecainide today.  February 15, 2014:  Kelly Lang returns for further evaluation of her atrial fib.  She has been on Flecainide 100 BID for a month.  She started feeling better about 2 weeks ago.  She has converted to NSR.   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    Sept. 16, 2015:  Kelly Lang is doing well.  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     February 13, 2015:  Kelly Lang is a 61 y.o. female who presents for follow-up of atrial fibrillation. Is on coumadin . INR is 2.0 this am.   No CP or dyspnea.    Glucose levels are ok.   HbA1C is a bit high . Was started on Tragenta. Only a little bit of exercise.   Missed 4 days of flecainide due to insurance not paying for it. Staying in NSR  Nov. 29, 2016:  Had a CVA in Oct, 2016.  Had not been on her coumadin and all of her medications.  Echo revealed severe LV dysfunction with EF of 20-25%.   Was started on Xarelto and metoprolol at that time   Has had some some chills and achy feeling for the past couple of days. No cough, no fever, no runny nose.  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     February 08, 2016:  Kelly Lang has been back on her meds.   She had stopped her medications and had developed worsening congestive heart failure. Previous echocardiogram revealed an EF of 25%. She restart her medications and the recent echo 2 days ago reveals recovery of her left ventricular systolic function to an ejection fraction 45% which is her normal.  She's feeling quite a bit better. She has taken flecainide in the past which helped her stay in normal sinus rhythm.  She stopped the flecainide due to lack of insurance. She now has [employer] and wants to get back on flecainide.  March 07, 2016:  Kelly Lang is seen back today for follow-up of her atrial fibrillation. She's been on flecainide 100 mg twice a day.  She's had her stress test for flecainide testing and it revealed no QRS widening. She had an unsuccessful cardioversion Jan. 7, 2015 She cannot tell how much she is in NSR vs. Afib.   Oct. 9, 2017:  She is s/p cardioversion in April She is feeling better.  Able to walk without any dyspnea   May 18, 2017:  Kelly Lang is seen back today for follow up  No CP or dyspnea  Is now on Jardiance for the past week   Apr 12, 2018  Kelly Lang is seen for follow up visit  Has had an ablation since I last  Feels so much  better.    Able to walk but not getting any regular exercise  Still on Xarelto.  Body mass index is 39.88 kg/m.  October 05, 2019: Kelly Lang is seen today for follow-up visit.  He has a history of paroxysmal atrial fibrillation, obesity, hyperlipidemia and diabetes mellitus. Is not exercising much .  Working on getting diabetes regulated   Wt is 250 ( up 6 lbs)  Is s/p AFIB ablation    February 26, 2021:  Kelly Lang is seen today - hx of atrial fib, - s/p ablation Hx of HLD, DM, obesity Wt is 235 lbs   Chol levels are ok  Chol = 115 HDL = 32 LDL = 49 Trigs are 208  !!  Is trying to lose some weight   Mar 31, 2022: Kelly Lang is seen for follow  up Is maintaining  NSR   She still on Xarelto.  Weight today is 231 pounds.  She is down 19 pounds from 3 years ago.  Oct. 10, 2023 Kelly Lang is seen for follow up of her atrial fib, obesity,   Past Medical History:  Diagnosis Date   Arthritis    "all over" (06/30/2017)   Chicken pox    Dyslipidemia    Dysrhythmia ablation 2018   a-fib   GERD (gastroesophageal reflux disease)    H/O: hypothyroidism    a. thyroid biopsy 1996 with synthroid. Stopped taking rx and was loss to follow up b. normal thyroid function 10/20/13   High cholesterol    Hx of cardiovascular stress test    ETT/Lexiscan Myoview (12/2013):  No ischemia; not gated; low risk.   Hypertension    Obesity    Persistent atrial fibrillation (HCC)    a diagnosed 10/25/2013   Psoriasis    on Skyrizi   Seasonal allergies    Stroke [facility]) 2016   "some speech problems since" (06/30/2017)   Tachycardia induced cardiomyopathy (HCC)    a. 2/2 Afib with RVR for unknown duration (EF: 40-45%)   Thyroid disease    Type II diabetes mellitus (HCC)    a. hg A1c 10, newly diagnosed (10/26/13)    Past Surgical History:  Procedure Laterality Date   ATRIAL FIBRILLATION ABLATION  06/30/2017   ATRIAL FIBRILLATION ABLATION N/A 06/30/2017   Procedure: Atrial Fibrillation Ablation;  Surgeon:  Hillis Range, MD;  Location: [facility];  Service: Cardiovascular;  Laterality: N/A;   BICEPT TENODESIS Right 05/24/2020   Procedure: BICEPS TENODESIS;  Surgeon: Cammy Copa, MD;  Location: [facility];  Service: Orthopedics;  Laterality: Right;  BIOPSY THYROID  1996   CARDIOVERSION N/A 12/07/2013   Procedure: CARDIOVERSION;  Surgeon: Everette Rank, MD;  Location: [facility] ENDOSCOPY;  Service: Cardiovascular;  Laterality: N/A;   CARDIOVERSION N/A 03/10/2016   Procedure: CARDIOVERSION;  Surgeon: Vesta Mixer, MD;  Location: [facility] ENDOSCOPY;  Service: Cardiovascular;  Laterality: N/A;   CORONARY BALLOON ANGIOPLASTY N/A 06/24/2022   Procedure: CORONARY BALLOON ANGIOPLASTY;  Surgeon: Lyn Records, MD;  Location: [facility] INVASIVE CV LAB;  Service: Cardiovascular;  Laterality: N/A;   ESOPHAGOGASTRODUODENOSCOPY N/A 12/07/2017   Procedure: ESOPHAGOGASTRODUODENOSCOPY (EGD);  Surgeon: Hilarie Fredrickson, MD;  Location: [facility] ENDOSCOPY;  Service: Endoscopy;  Laterality: N/A;   EYE MUSCLE SURGERY Right ~ 1966   LEFT HEART CATH AND CORONARY ANGIOGRAPHY N/A 06/24/2022   Procedure: LEFT HEART CATH AND CORONARY ANGIOGRAPHY;  Surgeon: Lyn Records, MD;  Location: [facility];  Service: Cardiovascular;  Laterality: N/A;   SHOULDER ARTHROSCOPY WITH ROTATOR CUFF REPAIR Right 05/24/2020   Procedure: right shoulder arthroscopy, rotator cuff tear repair biceps tenodesis;  Surgeon: Cammy Copa, MD;  Location:  SURGERY CENTER;  Service: Orthopedics;  Laterality: Right;   TEE WITHOUT CARDIOVERSION N/A 06/29/2017   Procedure: TRANSESOPHAGEAL ECHOCARDIOGRAM (TEE);  Surgeon: Pricilla Riffle, MD;  Location: [facility] ENDOSCOPY;  Service: Cardiovascular;  Laterality: N/A;   TONSILLECTOMY  1971     Current Outpatient Medications  Medication Sig Dispense Refill   acetaminophen (TYLENOL) 500 MG tablet Take 1,000 mg by mouth every 6 (six) hours as needed for moderate pain.     atorvastatin (LIPITOR) 80  MG tablet Take 1 tablet (80 mg total) by mouth daily. 90 tablet 3   clopidogrel (PLAVIX) 75 MG tablet Take 1 tablet (75 mg total) by mouth daily with breakfast. 90 tablet 3   Continuous Blood Gluc Receiver (DEXCOM G7 RECEIVER) DEVI Use to monitor BG continuously 1 each 0   Continuous Blood Gluc Sensor (DEXCOM G7 SENSOR) MISC CHANGE SENSOR EVERY 10 DAYS 3 each 2   diclofenac Sodium (VOLTAREN) 1 % GEL Apply 2 g topically 4 (four) times daily. 100 g 2   DULoxetine (CYMBALTA) 30 MG capsule Take 1 capsule (30 mg total) by mouth daily. 90 capsule 0   glipiZIDE (GLUCOTROL XL) 5 MG 24 hr tablet Take 1 tablet (5 mg total) by mouth daily with breakfast. 90 tablet 1   isosorbide mononitrate (IMDUR) 30 MG 24 hr tablet Take 1 tablet (30 mg total) by mouth daily. 90 tablet 3   lisinopril (ZESTRIL) 10 MG tablet Take 1 tablet (10 mg total) by mouth daily. 90 tablet 0   metFORMIN (GLUCOPHAGE) 1000 MG tablet TAKE 1 TABLET BY MOUTH TWICE DAILY WITH A MEAL 180 tablet 0   metoCLOPramide (REGLAN) 10 MG tablet Take 1 tablet (10 mg total) by mouth every 6 (six) hours. (Patient taking differently: Take 10 mg by mouth every 6 (six) hours as needed (gastroparesis).) 30 tablet 0   metoprolol succinate (TOPROL-XL) 25 MG 24 hr tablet Take 3 tablets (75 mg total) by mouth daily. 270 tablet 3   ONETOUCH DELICA LANCETS 33G MISC Use as instructed to check blood sugar up to 3 times daily. 100 each 11   ONETOUCH VERIO test strip USE STRIPS TO CHECK GLUCOSE UP TO THREE TIMES DAILY AS DIRECTED 100 each 6   OZEMPIC, 2 MG/DOSE, 8 MG/3ML SOPN INJECT 2MG  SUBCUTANEOUSLY ONCE A WEEK 9 mL 0   pantoprazole (PROTONIX) 40 MG tablet Take 1 tablet (40 mg total) by mouth daily. 30  tablet 3   rivaroxaban (XARELTO) 20 MG TABS tablet Take 1 tablet (20 mg total) by mouth daily with supper. 90 tablet 3   SEMGLEE, YFGN, 100 UNIT/ML Pen INJECT 80 UNITS SUBCUTANEOUSLY  AT BEDTIME (Patient taking differently: Inject 80 Units into the skin at bedtime.) 30 mL  1   SKYRIZI PEN 150 MG/ML SOAJ Inject 150 mg into the skin every 3 (three) months.     TRUEPLUS PEN NEEDLES 32G X 4 MM MISC USE AS DIRECTED AT BEDTIME. 100 each 2   No current facility-administered medications for this visit.    Allergies:   Jardiance [empagliflozin] and Codeine    Social History:  The patient  reports that she quit smoking about 11 years ago. Her smoking use included cigarettes. She has a 22.00 pack-year smoking history. She has never used smokeless tobacco. She reports current alcohol use. She reports that she does not use drugs.   Family History:  The patient's family history includes Hyperlipidemia in her mother; Hypertension in her mother. She was adopted.     Physical Exam: Last menstrual period 04/15/2015.  No BP recorded.  {Refresh Note OR Click here to enter BP  :1}***    GEN:  Well nourished, well developed in no acute distress HEENT: Normal NECK: No JVD; No carotid bruits LYMPHATICS: No lymphadenopathy CARDIAC: RRR ***, no murmurs, rubs, gallops RESPIRATORY:  Clear to auscultation without rales, wheezing or rhonchi  ABDOMEN: Soft, non-tender, non-distended MUSCULOSKELETAL:  No edema; No deformity  SKIN: Warm and dry NEUROLOGIC:  Alert and oriented x 3     EKG:      Recent Labs: 06/21/2022: B Natriuretic Peptide 294.6 06/23/2022: ALT 26 06/25/2022: BUN 17; Creatinine, Ser 0.94; Hemoglobin 13.5; Magnesium 2.0; Platelets 234; Potassium 4.8; Sodium 136    Lipid Panel    Component Value Date/Time   CHOL 170 06/25/2022 0514   CHOL 149 01/21/2022 0935   TRIG 305 (H) 06/25/2022 0514   HDL 25 (L) 06/25/2022 0514   HDL 37 (L) 01/21/2022 0935   CHOLHDL 6.8 06/25/2022 0514   VLDL 61 (H) 06/25/2022 0514   LDLCALC 84 06/25/2022 0514   LDLCALC 78 01/21/2022 0935   LDLDIRECT 114.0 02/13/2015 0750      Wt Readings from Last 3 Encounters:  07/02/22 231 lb (104.8 kg)  06/25/22 227 lb 4.7 oz (103.1 kg)  05/21/22 230 lb 9.6 oz (104.6 kg)       Other studies Reviewed: Additional studies/ records that were reviewed today include: . Review of the above records demonstrates:    ASSESSMENT AND PLAN:  1. Atrial fibrillation-she status post A. fib ablation.      2. Hypothyroidism -      3. Type 2 diabetes mellitus -   4.   Hyperlipidemia:   5.  Morbid obesity:       Current medicines are reviewed at length with the patient today.  The patient does not have concerns regarding medicines.  The following changes have been made:  no change  Labs/ tests ordered today include:   No orders of the defined types were placed in this encounter.   Disposition:     1 year with me or APP     Kristeen Miss, MD  09/07/2022 10:28 AM    [facility] [address] [place], [place], [place]  [address] Phone: [phone]; Fax: [phone]

## 2022-09-07 NOTE — H&P (View-Only) (Signed)
Cardiology Office Note   Date:  09/09/2022   ID:  Kelly Lang, DOB 1961-10-25, MRN [id number]  PCP:  Ladell Pier, MD  Cardiologist:   Mertie Moores, MD   Chief Complaint  Patient presents with   Atrial Fibrillation        1. Paroxysmal Atrial fibrillation 2. Hypothyroidism 3. Type 2 diabetes mellitus 4. Chronic systolic CHF    Kelly Lang is a 61 y.o. female with a hx of T2DM, HL, thyroid disease and atrial fibrillation.  She had been on Synthroid for some time after a thyroid bx in 1996.  She was admitted 10/2013 with AFib with RVR.  Echo (10/26/13):  EF 40-45%, normal wall motion, mild LAE.  CHADS2-VASc= 3 (LV dysfunction, DM, female).  She was placed on coumadin.  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  She was last seen 11/29/2013.  She was set up for DCCV after 3-4 weeks of appropriate anticoagulation.  DCCV was performed 12/07/2013 but was unsuccessful.  She returns for follow up.  She is stable. She is frustrated with her atrial fibrillation. She does get tired more easily. She denies chest pain. She denies significant dyspnea. She is NYHA class II. She denies syncope. She denies orthopnea, PND, edema, palpitations.  Feb. 16, 2015:  She had a cardioversion twice.  Converted back to Afib both times. She has had a stress myoview which was negative for ischemia.    we will start flecainide today.  February 15, 2014:  [name] returns for further evaluation of her atrial fib.  She has been on Flecainide 100 BID for a month.  She started feeling better about 2 weeks ago.  She has converted to NSR.   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    Sept. 16, 2015:  Kelly Lang is doing well.  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     February 13, 2015:  Kelly Lang is a 61 y.o. female who presents for follow-up of atrial fibrillation. Is on coumadin . INR is 2.0 this am.   No CP or dyspnea.    Glucose levels are ok.   HbA1C is a bit high . Was started on Tragenta. Only a little bit of exercise.   Missed 4 days of flecainide due to insurance not paying for it. Staying in NSR  Nov. 29, 2016:  Had a CVA in Oct, 2016.  Had not been on her coumadin and all of her medications.  Echo revealed severe LV dysfunction with EF of 20-25%.   Was started on Xarelto and metoprolol at that time   Has had some some chills and achy feeling for the past couple of days. No cough, no fever, no runny nose.  Cannot tell when she has atrial fib Not Exercising regularly  Works in [employer]	[place] of a hotel and also in [employer] room  Glucose levels are ok  Sleeping ok .     February 08, 2016:  Kelly Lang has been back on her meds.   She had stopped her medications and had developed worsening congestive heart failure. Previous echocardiogram revealed an EF of 25%. She restart her medications and the recent echo 2 days ago reveals recovery of her left ventricular systolic function to an ejection fraction 45% which is her normal.  She's feeling quite a bit better. She has taken flecainide in the past which helped her stay in normal sinus rhythm.  She stopped the flecainide due to lack of insurance. She now has [employer] and wants to get back on flecainide.  March 07, 2016:  Kelly Lang is seen back today for follow-up of her atrial fibrillation. She's been on flecainide 100 mg twice a day.  She's had her stress test for flecainide testing and it revealed no QRS widening. She had an unsuccessful cardioversion Jan. 7, 2015 She cannot tell how much she is in NSR vs. Afib.   Oct. 9, 2017:  She is s/p cardioversion in April She is feeling better.  Able to walk without any dyspnea   May 18, 2017:  Kelly Lang is seen back today for follow up  No CP or dyspnea  Is now on Jardiance for the past week   Apr 12, 2018  Kelly Lang is seen for follow up visit  Has had an ablation since I last  Feels so much  better.    Able to walk but not getting any regular exercise  Still on Xarelto.  Body mass index is 39.88 kg/m.  October 05, 2019: Kelly Lang is seen today for follow-up visit.  He has a history of paroxysmal atrial fibrillation, obesity, hyperlipidemia and diabetes mellitus. Is not exercising much .  Working on getting diabetes regulated   Wt is 250 ( up 6 lbs)  Is s/p AFIB ablation    February 26, 2021:  Kelly Lang is seen today - hx of atrial fib, - s/p ablation Hx of HLD, DM, obesity Wt is 235 lbs   Chol levels are ok  Chol = 115 HDL = 32 LDL = 49 Trigs are 208  !!  Is trying to lose some weight   Mar 31, 2022: Kelly Lang is seen for follow  up Is maintaining  NSR   She still on Xarelto.  Weight today is 231 pounds.  She is down 19 pounds from 3 years ago.  Oct. 10, 2023 Kelly Lang is seen for follow up of her atrial fib, obesity,   Past Medical History:  Diagnosis Date   Arthritis    "all over" (06/30/2017)   Chicken pox    Dyslipidemia    Dysrhythmia ablation 2018   a-fib   GERD (gastroesophageal reflux disease)    H/O: hypothyroidism    a. thyroid biopsy 1996 with synthroid. Stopped taking rx and was loss to follow up b. normal thyroid function 10/20/13   High cholesterol    Hx of cardiovascular stress test    ETT/Lexiscan Myoview (12/2013):  No ischemia; not gated; low risk.   Hypertension    Obesity    Persistent atrial fibrillation (HCC)    a diagnosed 10/25/2013   Psoriasis    on Skyrizi   Seasonal allergies    Stroke (HCC) 2016   "some speech problems since" (06/30/2017)   Tachycardia induced cardiomyopathy (HCC)    a. 2/2 Afib with RVR for unknown duration (EF: 40-45%)   Thyroid disease    Type II diabetes mellitus (HCC)    a. hg A1c 10, newly diagnosed (10/26/13)    Past Surgical History:  Procedure Laterality Date   ATRIAL FIBRILLATION ABLATION  06/30/2017   ATRIAL FIBRILLATION ABLATION N/A 06/30/2017   Procedure: Atrial Fibrillation Ablation;  Surgeon:  Allred, James, MD;  Location: [facility];  Service: Cardiovascular;  Laterality: N/A;   BICEPT TENODESIS Right 05/24/2020   Procedure: BICEPS TENODESIS;  Surgeon: Dean, Gregory Scott, MD;  Location: [facility];  Service: Orthopedics;  Laterality: Right;     BIOPSY THYROID  1996   CARDIOVERSION N/A 12/07/2013   Procedure: CARDIOVERSION;  Surgeon: Jay Varanasi, MD;  Location: [facility];  Service: Cardiovascular;  Laterality: N/A;   CARDIOVERSION N/A 03/10/2016   Procedure: CARDIOVERSION;  Surgeon: Argenis Kumari J Abbye Lao, MD;  Location: [facility];  Service: Cardiovascular;  Laterality: N/A;   CORONARY BALLOON ANGIOPLASTY N/A 06/24/2022   Procedure: CORONARY BALLOON ANGIOPLASTY;  Surgeon: Smith, Henry W, MD;  Location: [facility];  Service: Cardiovascular;  Laterality: N/A;   ESOPHAGOGASTRODUODENOSCOPY N/A 12/07/2017   Procedure: ESOPHAGOGASTRODUODENOSCOPY (EGD);  Surgeon: Perry, John N, MD;  Location: [facility];  Service: Endoscopy;  Laterality: N/A;   EYE MUSCLE SURGERY Right ~ 1966   LEFT HEART CATH AND CORONARY ANGIOGRAPHY N/A 06/24/2022   Procedure: LEFT HEART CATH AND CORONARY ANGIOGRAPHY;  Surgeon: Smith, Henry W, MD;  Location: [facility];  Service: Cardiovascular;  Laterality: N/A;   SHOULDER ARTHROSCOPY WITH ROTATOR CUFF REPAIR Right 05/24/2020   Procedure: right shoulder arthroscopy, rotator cuff tear repair biceps tenodesis;  Surgeon: Dean, Gregory Scott, MD;  Location: [facility];  Service: Orthopedics;  Laterality: Right;   TEE WITHOUT CARDIOVERSION N/A 06/29/2017   Procedure: TRANSESOPHAGEAL ECHOCARDIOGRAM (TEE);  Surgeon: Ross, Paula V, MD;  Location: [facility];  Service: Cardiovascular;  Laterality: N/A;   TONSILLECTOMY  1971     Current Outpatient Medications  Medication Sig Dispense Refill   acetaminophen (TYLENOL) 500 MG tablet Take 1,000 mg by mouth every 6 (six) hours as needed for moderate pain.     atorvastatin (LIPITOR) 80  MG tablet Take 1 tablet (80 mg total) by mouth daily. 90 tablet 3   clopidogrel (PLAVIX) 75 MG tablet Take 1 tablet (75 mg total) by mouth daily with breakfast. 90 tablet 3   Continuous Blood Gluc Receiver (DEXCOM G7 RECEIVER) DEVI Use to monitor BG continuously 1 each 0   Continuous Blood Gluc Sensor (DEXCOM G7 SENSOR) MISC CHANGE SENSOR EVERY 10 DAYS 3 each 2   diclofenac Sodium (VOLTAREN) 1 % GEL Apply 2 g topically 4 (four) times daily. 100 g 2   DULoxetine (CYMBALTA) 30 MG capsule Take 1 capsule (30 mg total) by mouth daily. 90 capsule 0   glipiZIDE (GLUCOTROL XL) 5 MG 24 hr tablet Take 1 tablet (5 mg total) by mouth daily with breakfast. 90 tablet 1   isosorbide mononitrate (IMDUR) 30 MG 24 hr tablet Take 1 tablet (30 mg total) by mouth daily. 90 tablet 3   lisinopril (ZESTRIL) 10 MG tablet Take 1 tablet (10 mg total) by mouth daily. 90 tablet 0   metFORMIN (GLUCOPHAGE) 1000 MG tablet TAKE 1 TABLET BY MOUTH TWICE DAILY WITH A MEAL 180 tablet 0   metoCLOPramide (REGLAN) 10 MG tablet Take 1 tablet (10 mg total) by mouth every 6 (six) hours. (Patient taking differently: Take 10 mg by mouth every 6 (six) hours as needed (gastroparesis).) 30 tablet 0   metoprolol succinate (TOPROL-XL) 25 MG 24 hr tablet Take 3 tablets (75 mg total) by mouth daily. 270 tablet 3   ONETOUCH DELICA LANCETS 33G MISC Use as instructed to check blood sugar up to 3 times daily. 100 each 11   ONETOUCH VERIO test strip USE STRIPS TO CHECK GLUCOSE UP TO THREE TIMES DAILY AS DIRECTED 100 each 6   OZEMPIC, 2 MG/DOSE, 8 MG/3ML SOPN INJECT 2MG SUBCUTANEOUSLY ONCE A WEEK 9 mL 0   pantoprazole (PROTONIX) 40 MG tablet Take 1 tablet (40 mg total) by mouth daily. 30   isosorbide mononitrate (IMDUR) 30 MG 24 hr tablet Take 1 tablet (30 mg total) by mouth daily. 90 tablet 3   lisinopril (ZESTRIL) 10 MG tablet Take 1 tablet (10 mg total) by mouth daily. 90 tablet  0   metFORMIN (GLUCOPHAGE) 1000 MG tablet Take 1 tablet (1,000 mg total) by mouth 2 (two) times daily with a meal. 180 tablet 0   metoCLOPramide (REGLAN) 10 MG tablet Take 1 tablet (10 mg total) by mouth every 6 (six) hours. 30 tablet 0   metoprolol succinate (TOPROL-XL) 25 MG 24 hr tablet Take 3 tablets (75 mg total) by mouth daily. 270 tablet 3   ONETOUCH DELICA LANCETS 99991111 MISC Use as instructed to check blood sugar up to 3 times daily. 100 each 11   ONETOUCH VERIO test strip USE STRIPS TO CHECK GLUCOSE UP TO THREE TIMES DAILY AS DIRECTED 100 each 6   OZEMPIC, 2 MG/DOSE, 8 MG/3ML SOPN INJECT 2MG  SUBCUTANEOUSLY ONCE A WEEK 9 mL 0   pantoprazole (PROTONIX) 40 MG tablet Take 1 tablet (40 mg total) by mouth daily. 30 tablet 3   rivaroxaban (XARELTO) 20 MG TABS tablet Take 1 tablet (20 mg total) by mouth daily with supper. 90 tablet 3   SEMGLEE, YFGN, 100 UNIT/ML Pen INJECT 80 UNITS SUBCUTANEOUSLY  AT BEDTIME 30 mL 1   SKYRIZI PEN 150 MG/ML SOAJ Inject 150 mg into the skin every 3 (three) months.     TRUEPLUS PEN NEEDLES 32G X 4 MM MISC USE AS DIRECTED AT BEDTIME. 100 each 2   No current facility-administered medications for this visit.    Allergies:   Jardiance [empagliflozin] and Codeine    Social History:  The patient  reports that she quit smoking about 11 years ago. Her smoking use included cigarettes. She has a 22.00 pack-year smoking history. She has never used smokeless tobacco. She reports current alcohol use. She reports that she does not use drugs.   Family History:  The patient's family history includes Hyperlipidemia in her mother; Hypertension in her mother. She was adopted.     Physical Exam: Blood pressure 100/62, pulse 70, height 5' 5.5" (1.664 m), weight 231 lb 1.6 oz (104.8 kg), last menstrual period 04/15/2015, SpO2 97 %.       GEN:  middle age female, morbidly obese in no acute distress HEENT: Normal NECK: No JVD; No carotid bruits LYMPHATICS: No  lymphadenopathy CARDIAC: RRR, no significant murmurs RESPIRATORY:  Clear to auscultation without rales, wheezing or rhonchi  ABDOMEN: Soft, non-tender, non-distended MUSCULOSKELETAL:  No edema; No deformity  SKIN: Warm and dry NEUROLOGIC:  Alert and oriented x 3  EKG: September 09, 2022: Normal sinus rhythm at 73.  Left anterior fascicular block.  No ST or T wave changes.  Moderate voltage criteria for left ventricular hypertrophy.    Recent Labs: 06/21/2022: B Natriuretic Peptide 294.6 06/23/2022: ALT 26 06/25/2022: BUN 17; Creatinine, Ser 0.94; Hemoglobin 13.5; Magnesium 2.0; Platelets 234; Potassium 4.8; Sodium 136    Lipid Panel    Component Value Date/Time   CHOL 170 06/25/2022 0514   CHOL 149 01/21/2022 0935   TRIG 305 (H) 06/25/2022 0514   HDL 25 (L) 06/25/2022 0514   HDL 37 (L) 01/21/2022 0935   CHOLHDL 6.8 06/25/2022 0514   VLDL 61 (H) 06/25/2022 0514   LDLCALC 84 06/25/2022 0514   LDLCALC 78 01/21/2022 0935   LDLDIRECT 114.0 02/13/2015 0750      Wt Readings from Last 3 Encounters:  09/09/22 231 lb 1.6  oz (104.8 kg)  09/08/22 229 lb 3.2 oz (104 kg)  07/02/22 231 lb (104.8 kg)      Other studies Reviewed: Additional studies/ records that were reviewed today include: . Review of the above records demonstrates:    ASSESSMENT AND PLAN:  1. Atrial fibrillation-she status post A. fib ablation.  She is not having any further episodes of afib     2. CAD : She presented in July with intrascapular chest pain and associated shortness of breath.  Troponins were elevated.  Heart catheterization reveals a proximal LAD occlusion with left-to-right collateral filling.  PCI was attempted but Dr. Tamala Julian was not able to get any balloon across the lesion.  There was some concern that there was a thrombus just distal to the high-grade occlusion.  She continues to have episodes of intrascapular pain and shortness of breath with any sort of exertion.  We will reschedule her for  PCI/CSI  atherectomy of her proximal LAD with Dr. Burt Knack.  We discussed the risk, benefits, options of heart catheterization.  She understands and agrees to proceed.  She understands that she may ultimately need coronary artery bypass grafting.  She will need to have her diabetes well controlled in order to allow for adequate healing.   3. Type 2 diabetes mellitus -advised her to greatly improve her diet.  She admits that she eats a lot of cake.  We need her diabetes to be under much better control.  4.   Hyperlipidemia:   5.  Morbid obesity:   I encouraged her to work on weight loss.    Current medicines are reviewed at length with the patient today.  The patient does not have concerns regarding medicines.  The following changes have been made:  no change  Labs/ tests ordered today include:   No orders of the defined types were placed in this encounter.   Disposition:     6-8 weeks follow up with me    Mertie Moores, MD  09/09/2022 9:29 AM    [address], [place], [place]  [address] Phone: [phone]; Fax: [phone]

## 2022-09-08 ENCOUNTER — Ambulatory Visit: Payer: BC Managed Care – PPO | Attending: Internal Medicine | Admitting: Internal Medicine

## 2022-09-08 ENCOUNTER — Encounter: Payer: Self-pay | Admitting: Internal Medicine

## 2022-09-08 VITALS — BP 117/79 | HR 79 | Ht 65.5 in | Wt 229.2 lb

## 2022-09-08 DIAGNOSIS — I5022 Chronic systolic (congestive) heart failure: Secondary | ICD-10-CM

## 2022-09-08 DIAGNOSIS — Z1231 Encounter for screening mammogram for malignant neoplasm of breast: Secondary | ICD-10-CM

## 2022-09-08 DIAGNOSIS — Z23 Encounter for immunization: Secondary | ICD-10-CM | POA: Diagnosis not present

## 2022-09-08 DIAGNOSIS — I25118 Atherosclerotic heart disease of native coronary artery with other forms of angina pectoris: Secondary | ICD-10-CM

## 2022-09-08 DIAGNOSIS — I152 Hypertension secondary to endocrine disorders: Secondary | ICD-10-CM

## 2022-09-08 DIAGNOSIS — E1169 Type 2 diabetes mellitus with other specified complication: Secondary | ICD-10-CM

## 2022-09-08 DIAGNOSIS — Z6837 Body mass index (BMI) 37.0-37.9, adult: Secondary | ICD-10-CM

## 2022-09-08 DIAGNOSIS — E1159 Type 2 diabetes mellitus with other circulatory complications: Secondary | ICD-10-CM | POA: Diagnosis not present

## 2022-09-08 DIAGNOSIS — M2550 Pain in unspecified joint: Secondary | ICD-10-CM

## 2022-09-08 DIAGNOSIS — E669 Obesity, unspecified: Secondary | ICD-10-CM

## 2022-09-08 MED ORDER — DICLOFENAC SODIUM 1 % EX GEL: 2.0000 g | Freq: Four times a day (QID) | CUTANEOUS | 2 refills | Status: AC

## 2022-09-08 MED ORDER — DULOXETINE HCL 30 MG PO CPEP: 30.0000 mg | ORAL_CAPSULE | Freq: Every day | ORAL | 0 refills | Status: DC

## 2022-09-08 MED ORDER — METFORMIN HCL 1000 MG PO TABS: 1000.0000 mg | ORAL_TABLET | Freq: Two times a day (BID) | ORAL | 0 refills | Status: DC

## 2022-09-08 MED ORDER — LISINOPRIL 10 MG PO TABS: 10.0000 mg | ORAL_TABLET | Freq: Every day | ORAL | 0 refills | Status: DC

## 2022-09-08 NOTE — Progress Notes (Unsigned)
Patient ID: Kelly Lang, female    DOB: Apr 24, 1961  MRN: [id number]  CC: Diabetes   Subjective: Kelly Lang is a 61 y.o. 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  DM: Patient is followed by endocrinology. Currently on Ozempic 2 mg weekly, Semglee 80 units daily, metformin 1 g twice a day and glipizide 5 mg.  Reports compliance with medications. She has a Dexcom device.  Device is showing that she has been in range 68% of the times over the past 2 weeks and high 31% of the times.  Average blood glucose was 160.  A1c showing on her device is 7.1. She feels she does much better with her eating habits now that she has a Dexcom device. Not getting in as much exercise but we will be starting cardiac rehab in the next week.  HTN/CAD/a.flutter: reports compliance with lisinopril 10 mg daily, Xarelto 20 mg daily, isosorbide 30 mg daily, metoprolol 75 mg daily, atorvastatin 80 mg and Plavix 75 mg daily.  Denies any bruising or bleeding being that she is on Xarelto and Plavix. -Reports shortness of breath and chest pains sometimes if she overexerts herself. No lower extremity edema.  No palpitations.  Polyarthralgia: Request refill on Cymbalta and also Voltaren gel.    Patient Active Problem List   Diagnosis Date Noted   Class 2 obesity 06/25/2022   NSTEMI (non-ST elevated myocardial infarction) (HCC)    Thyroid nodule 06/22/2022   Trigger ring finger of left hand 02/05/2021   Trigger little finger of right hand 02/05/2021   Mixed hyperlipidemia 09/17/2020   Multinodular goiter 09/16/2019   Chronic pain disorder 08/09/2018   Diabetic polyneuropathy associated with type 2 diabetes mellitus (HCC) 08/09/2018   GI  bleed 12/06/2017   Hepatic steatosis 12/04/2017   Acute on chronic HFrEF (heart failure with reduced ejection fraction) (HCC) 09/16/2015   Cerebrovascular accident (CVA) due to 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)    Low back pain 04/03/2014   Vitamin D deficiency 01/20/2014   Essential hypertension, benign 01/20/2014   H/O: hypothyroidism    Class 2 severe obesity due to excess calories with serious comorbidity and body mass index (BMI) of 37.0 to 37.9 in adult [facility])    Type 2 diabetes mellitus with hyperlipidemia (HCC)    Arthritis    Atrial fibrillation  10/25/2013     Current Outpatient Medications on File Prior to Visit  Medication Sig Dispense Refill   acetaminophen (TYLENOL) 500 MG tablet Take 1,000 mg by mouth every 6 (six) hours as needed for moderate pain.     atorvastatin (LIPITOR) 80 MG tablet Take 1 tablet (80 mg total) by mouth daily. 90 tablet 3   clopidogrel (PLAVIX) 75 MG tablet Take 1 tablet (75 mg total) by mouth daily with breakfast. 90 tablet 3   Continuous Blood Gluc Receiver (DEXCOM G7 RECEIVER) DEVI Use to monitor BG continuously 1 each 0   Continuous Blood Gluc Sensor (DEXCOM G7 SENSOR) MISC CHANGE SENSOR EVERY 10 DAYS 3 each 2   diclofenac Sodium (VOLTAREN) 1 % GEL Apply 2 g topically 4 (four) times daily. 100 g 2   DULoxetine (CYMBALTA) 30 MG capsule Take 1 capsule (30 mg total) by mouth daily. 90 capsule  0   glipiZIDE (GLUCOTROL XL) 5 MG 24 hr tablet Take 1 tablet (5 mg total) by mouth daily with breakfast. 90 tablet 1   isosorbide mononitrate (IMDUR) 30 MG 24 hr tablet Take 1 tablet (30 mg total) by mouth daily. 90 tablet 3   lisinopril (ZESTRIL) 10 MG tablet Take 1 tablet (10 mg total) by mouth daily. 90 tablet 0   metFORMIN (GLUCOPHAGE) 1000 MG tablet TAKE 1 TABLET BY MOUTH TWICE DAILY WITH A MEAL 180 tablet 0   metoCLOPramide (REGLAN) 10 MG tablet Take 1 tablet (10 mg total) by mouth every 6 (six) hours. (Patient taking differently: Take 10 mg by  mouth every 6 (six) hours as needed (gastroparesis).) 30 tablet 0   metoprolol succinate (TOPROL-XL) 25 MG 24 hr tablet Take 3 tablets (75 mg total) by mouth daily. 270 tablet 3   ONETOUCH DELICA LANCETS 33G MISC Use as instructed to check blood sugar up to 3 times daily. 100 each 11   ONETOUCH VERIO test strip USE STRIPS TO CHECK GLUCOSE UP TO THREE TIMES DAILY AS DIRECTED 100 each 6   OZEMPIC, 2 MG/DOSE, 8 MG/3ML SOPN INJECT 2MG  SUBCUTANEOUSLY ONCE A WEEK 9 mL 0   pantoprazole (PROTONIX) 40 MG tablet Take 1 tablet (40 mg total) by mouth daily. 30 tablet 3   rivaroxaban (XARELTO) 20 MG TABS tablet Take 1 tablet (20 mg total) by mouth daily with supper. 90 tablet 3   SEMGLEE, YFGN, 100 UNIT/ML Pen INJECT 80 UNITS SUBCUTANEOUSLY  AT BEDTIME (Patient taking differently: Inject 80 Units into the skin at bedtime.) 30 mL 1   SKYRIZI PEN 150 MG/ML SOAJ Inject 150 mg into the skin every 3 (three) months.     TRUEPLUS PEN NEEDLES 32G X 4 MM MISC USE AS DIRECTED AT BEDTIME. 100 each 2   No current facility-administered medications on file prior to visit.    Allergies  Allergen Reactions   Jardiance [Empagliflozin] Other (See Comments)    Yeast infections   Codeine Itching and Rash    Social History   Socioeconomic History   Marital status: Divorced    Spouse name: Not on file   Number of children: 0   Years of education: 14   Highest education level: Not on file  Occupational History   Occupation:  Tobacco Use   Smoking status: Former    Packs/day: 1.00    Years: 22.00    Total pack years: 22.00    Types: Cigarettes    Quit date: 12/01/2010    Years since quitting: 11.7   Smokeless tobacco: Never  Vaping Use   Vaping Use: Never used  Substance and Sexual Activity   Alcohol use: Yes    Comment: social   Drug use: No   Sexual activity: Not Currently  Other Topics Concern   Not on file  Social History Narrative   [place]oved [place] from [place] in 2002.      Fun: shop, sew, decorate   Denies any beliefs effecting healthcare   Social Determinants of Health   Financial Resource Strain: Not on file  Food Insecurity: Not on file  Transportation Needs: Not on file  Physical Activity: Not on file  Stress: Not on file  Social Connections: Not on file  Intimate Partner Violence: Not on file    Family History  Adopted: Yes  Problem Relation Age of Onset   Hypertension Mother    Hyperlipidemia Mother     Past Surgical History:  Procedure Laterality Date   ATRIAL FIBRILLATION ABLATION  06/30/2017   ATRIAL FIBRILLATION ABLATION N/A 06/30/2017   Procedure: Atrial Fibrillation Ablation;  Surgeon: Thompson Grayer, MD;  Location: [place] [facility];  Service: Cardiovascular;  Laterality: N/A;   BICEPT TENODESIS Right 05/24/2020   Procedure: BICEPS TENODESIS;  Surgeon: Meredith Pel, MD;  Location: [place];  Service: Orthopedics;  Laterality: Right;   BIOPSY THYROID  1996   CARDIOVERSION N/A 12/07/2013   Procedure: CARDIOVERSION;  Surgeon: Casandra Doffing, MD;  Location: [facility] ENDOSCOPY;  Service: Cardiovascular;  Laterality: N/A;   CARDIOVERSION N/A 03/10/2016   Procedure: CARDIOVERSION;  Surgeon: Thayer Headings, MD;  Location: [place];  Service: Cardiovascular;  Laterality: N/A;   CORONARY BALLOON ANGIOPLASTY N/A 06/24/2022   Procedure: CORONARY BALLOON ANGIOPLASTY;  Surgeon: Belva Crome, MD;  Location: [place] [facility];  Service: Cardiovascular;  Laterality: N/A;   ESOPHAGOGASTRODUODENOSCOPY N/A 12/07/2017   Procedure: ESOPHAGOGASTRODUODENOSCOPY (EGD);  Surgeon: Irene Shipper, MD;  Location: [facility] ENDOSCOPY;  Service: Endoscopy;  Laterality: N/A;   EYE MUSCLE SURGERY Right ~ 1966   LEFT HEART CATH AND CORONARY ANGIOGRAPHY N/A 06/24/2022   Procedure: LEFT HEART CATH AND CORONARY ANGIOGRAPHY;  Surgeon: Belva Crome, MD;  Location: [place] [facility];  Service: Cardiovascular;  Laterality: N/A;   SHOULDER ARTHROSCOPY WITH ROTATOR  CUFF REPAIR Right 05/24/2020   Procedure: right shoulder arthroscopy, rotator cuff tear repair biceps tenodesis;  Surgeon: Meredith Pel, MD;  Location: [place];  Service: Orthopedics;  Laterality: Right;   TEE WITHOUT CARDIOVERSION N/A 06/29/2017   Procedure: TRANSESOPHAGEAL ECHOCARDIOGRAM (TEE);  Surgeon: Fay Records, MD;  Location: [facility] ENDOSCOPY;  Service: Cardiovascular;  Laterality: N/A;   TONSILLECTOMY  1971    ROS: Review of Systems Negative except as stated above  PHYSICAL EXAM: BP 117/79   Pulse 79   Ht 5' 5.5" (1.664 m)   Wt 229 lb 3.2 oz (104 kg)   LMP 04/15/2015 (LMP Unknown) Comment: last in may  SpO2 96%   BMI 37.56 kg/m   Physical Exam  General appearance - alert, well appearing, and in no distress Mental status - normal mood, behavior, speech, dress, motor activity, and thought processes Neck - supple, no significant adenopathy Chest - clear to auscultation, no wheezes, rales or rhonchi, symmetric air entry Heart - normal rate, regular rhythm, normal S1, S2, no murmurs, rubs, clicks or gallops Extremities - peripheral pulses normal, no pedal edema, no clubbing or cyanosis      Latest Ref Rng & Units 06/25/2022    5:11 AM 06/24/2022    3:52 AM 06/23/2022    5:11 PM  CMP  Glucose 70 - 99 mg/dL 232  232    BUN 6 - 20 mg/dL 17  22    Creatinine 0.44 - 1.00 mg/dL 0.94  0.88  0.83   Sodium 135 - 145 mmol/L 136  136    Potassium 3.5 - 5.1 mmol/L 4.8  4.0    Chloride 98 - 111 mmol/L 104  99    CO2 22 - 32 mmol/L 26  27    Calcium 8.9 - 10.3 mg/dL 8.9  9.3     Lipid Panel     Component Value Date/Time   CHOL 170 06/25/2022 0514   CHOL 149 01/21/2022 0935   TRIG 305 (H) 06/25/2022 0514   HDL 25 (L) 06/25/2022 0514   HDL 37 (L) 01/21/2022 0935   CHOLHDL 6.8 06/25/2022 0514  VLDL 61 (H) 06/25/2022 0514   LDLCALC 84 06/25/2022 0514   LDLCALC 78 01/21/2022 0935   LDLDIRECT 114.0 02/13/2015 0750    CBC    Component Value Date/Time    WBC 6.8 06/25/2022 0511   RBC 4.17 06/25/2022 0511   HGB 13.5 06/25/2022 0511   HGB 13.6 08/23/2021 1421   HCT 39.7 06/25/2022 0511   HCT 40.0 08/23/2021 1421   PLT 234 06/25/2022 0511   PLT 285 08/23/2021 1421   MCV 95.2 06/25/2022 0511   MCV 95 08/23/2021 1421   MCH 32.4 06/25/2022 0511   MCHC 34.0 06/25/2022 0511   RDW 12.5 06/25/2022 0511   RDW 12.3 08/23/2021 1421   LYMPHSABS 2.0 06/22/2022 1504   LYMPHSABS 1.7 11/25/2017 1017   MONOABS 0.7 06/22/2022 1504   EOSABS 0.3 06/22/2022 1504   EOSABS 0.3 11/25/2017 1017   BASOSABS 0.1 06/22/2022 1504   BASOSABS 0.1 11/25/2017 1017    ASSESSMENT AND PLAN:  1. 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(HCC) *** - metFORMIN (GLUCOPHAGE) 1000 MG tablet; Take 1 tablet (1,000 mg total) by mouth 2 (two) times daily with a meal.  Dispense: 180 tablet; Refill: 0 - Microalbumin / creatinine urine ratio  2. Hypertension associated with diabetes (HCC) *** - lisinopril (ZESTRIL) 10 MG tablet; Take 1 tablet (10 mg total) by mouth daily.  Dispense: 90 tablet; Refill: 0  3. Coronary artery disease of native artery of native heart with stable angina pectoris (HCC) ***  4. Chronic systolic congestive heart failure (HCC) ***  5. Polyarthralgia *** - diclofenac Sodium (VOLTAREN) 1 % GEL; Apply 2 g topically 4 (four) times daily.  Dispense: 100 g; Refill: 2 - DULoxetine (CYMBALTA) 30 MG capsule; Take 1 capsule (30 mg total) by mouth daily.  Dispense: 90 capsule; Refill: 0  6. Need for immunization against influenza *** - Flu Vaccine QUAD 45mo+IM (Fluarix, Fluzone & Alfiuria Quad PF)  7. Encounter for screening mammogram for malignant neoplasm of breast ***    Patient was given the opportunity to ask questions.  Patient verbalized understanding of the plan and was able to repeat key elements of the plan.   This documentation was completed using Paediatric nurse.  Any transcriptional errors are unintentional.  Orders  Placed This Encounter  Procedures   Flu Vaccine QUAD 42mo+IM (Fluarix, Fluzone & Alfiuria Quad PF)     Requested Prescriptions    No prescriptions requested or ordered in this encounter    No follow-ups on file.  Jonah Blue, MD, FACP

## 2022-09-09 ENCOUNTER — Telehealth (HOSPITAL_COMMUNITY): Payer: Self-pay | Admitting: Internal Medicine

## 2022-09-09 ENCOUNTER — Encounter: Payer: Self-pay | Admitting: Cardiovascular Disease

## 2022-09-09 ENCOUNTER — Ambulatory Visit: Payer: BC Managed Care – PPO | Attending: Cardiovascular Disease | Admitting: Cardiovascular Disease

## 2022-09-09 VITALS — BP 100/62 | HR 70 | Ht 65.5 in | Wt 231.1 lb

## 2022-09-09 DIAGNOSIS — Z01812 Encounter for preprocedural laboratory examination: Secondary | ICD-10-CM | POA: Diagnosis not present

## 2022-09-09 LAB — MICROALBUMIN / CREATININE URINE RATIO
Creatinine, Urine: 176.3 mg/dL
Microalb/Creat Ratio: 31 mg/g creat — ABNORMAL HIGH (ref 0–29)
Microalbumin, Urine: 54.2 ug/mL

## 2022-09-09 NOTE — Patient Instructions (Signed)
Medication Instructions:  NO CHANGES *If you need a refill on your cardiac medications before your next appointment, please call your pharmacy*   Lab Work: TODAY CBC BMET If you have labs (blood work) drawn today and your tests are completely normal, you will receive your results only by: [place] (if you have MyChart) OR A paper copy in the mail If you have any lab test that is abnormal or we need to change your treatment, we will call you to review the results.   Testing/Procedure   [place] A DEPT OF [place] A DEPT OF [facility] [place], [place] [id number] [place] [address] Dept: [phone] Loc: [phone]  Kelly Lang  09/09/2022  You are scheduled for a Cardiac Catheterization on Thursday, October 19 with Dr. Sherren Mocha.  1. Please arrive at the [facility] (Main Entrance A) at [facility]: [address] [place], [place] [address] at 9:30 AM (This time is two hours before your procedure to ensure your preparation). Free valet parking service is available.   Special note: Every effort is made to have your procedure done on time. Please understand that emergencies sometimes delay scheduled procedures.  2. Diet: Do not eat solid foods after midnight.  The patient may have clear liquids until 5am upon the day of the procedure.  3. Labs: You will need to have blood drawn on Tuesday, October 10 at [facility]. [address]  Open: 7:30am - 5pm    Phone: [phone]. You do not need to be fasting.  4. Medication instructions in preparation for your procedure:   Contrast Allergy: No   Stop taking Xarelto (Rivaroxaban) on Wednesday, October 18.    Take only 40 units of insulin the night before your procedure. Do not take any insulin on the day of the procedure.  Do not take Diabetes Med  Glucophage (Metformin) on the day of the procedure and HOLD 48 HOURS AFTER THE PROCEDURE. HOLD GLIPIZIDE AM OF CATH  On the morning of your procedure, take your Plavix/Clopidogrel and any morning medicines NOT listed above.  You may use sips of water.  5. Plan for one night stay--bring personal belongings. 6. Bring a current list of your medications and current insurance cards. 7. You MUST have a responsible person to drive you home. 8. Someone MUST be with you the first 24 hours after you arrive home or your discharge will be delayed. 9. Please wear clothes that are easy to get on and off and wear slip-on shoes.  Thank you for allowing [place] to care for you!   --  Invasive Cardiovascular services    Follow-Up: At [facility], you and your health needs are our priority.  As part of our continuing mission to provide you with exceptional heart care, we have created designated Provider Care Teams.  These Care Teams include your primary Cardiologist (physician) and Advanced Practice Providers (APPs -  Physician Assistants and Nurse Practitioners) who all work together to provide you with the care you need, when you need it.  We recommend signing up for the patient portal called "MyChart".  Sign up information is provided on this After Visit Summary.  MyChart is used to connect with patients for Virtual Visits (Telemedicine).  Patients are able to view lab/test results, encounter notes, upcoming appointments, etc.  Non-urgent messages can be sent  to your provider as well.   To learn more about what you can do with MyChart, go to [email or web address].    Your next appointment:   8 week(s)  The format for your next appointment:   In Person  Provider:   Kristeen Miss, MD     Other Instructions   Important Information About Sugar

## 2022-09-10 LAB — CBC
Hematocrit: 39.4 % (ref 34.0–46.6)
Hemoglobin: 13.8 g/dL (ref 11.1–15.9)
MCH: 32.4 pg (ref 26.6–33.0)
MCHC: 35 g/dL (ref 31.5–35.7)
MCV: 93 fL (ref 79–97)
Platelets: 266 10*3/uL (ref 150–450)
RBC: 4.26 x10E6/uL (ref 3.77–5.28)
RDW: 12 % (ref 11.7–15.4)
WBC: 6.2 10*3/uL (ref 3.4–10.8)

## 2022-09-10 LAB — BASIC METABOLIC PANEL
BUN/Creatinine Ratio: 17 (ref 9–23)
BUN: 12 mg/dL (ref 6–24)
CO2: 23 mmol/L (ref 20–29)
Calcium: 8.9 mg/dL (ref 8.7–10.2)
Chloride: 103 mmol/L (ref 96–106)
Creatinine, Ser: 0.69 mg/dL (ref 0.57–1.00)
Glucose: 171 mg/dL — ABNORMAL HIGH (ref 70–99)
Potassium: 4.2 mmol/L (ref 3.5–5.2)
Sodium: 139 mmol/L (ref 134–144)
eGFR: 100 mL/min/{1.73_m2} (ref >59)

## 2022-09-11 ENCOUNTER — Inpatient Hospital Stay (HOSPITAL_COMMUNITY): Admission: RE | Admit: 2022-09-11 | Payer: BC Managed Care – PPO | Source: Ambulatory Visit

## 2022-09-12 ENCOUNTER — Other Ambulatory Visit: Payer: Self-pay | Admitting: "Endocrinology

## 2022-09-12 DIAGNOSIS — E1159 Type 2 diabetes mellitus with other circulatory complications: Secondary | ICD-10-CM

## 2022-09-15 ENCOUNTER — Ambulatory Visit (HOSPITAL_COMMUNITY): Payer: BC Managed Care – PPO

## 2022-09-16 ENCOUNTER — Telehealth: Payer: Self-pay | Admitting: *Deleted

## 2022-09-16 NOTE — Telephone Encounter (Addendum)
Coronary Thombectomy scheduled at [facility] for: Thursday September 18, 2022 11:30 AM Arrival time and place: [place] Entrance A at: 9:30 AM  Nothing to eat after midnight prior to procedure, clear liquids until 5 AM day of procedure.  Medication instructions: -Hold:  Xarelto -none 09/16/22 until post procedure-okay to hold 2 days per Dr Acie Fredrickson  Metformin-day of procedure and 48 hours post procedure  Glipizide-AM of procedure  Semglee-1/2 usual dose HS prior to procedure  Ozempic-weekly on Sundays -pt reports last dose 09/14/22 -Except hold medications usual morning medications can be taken with sips of water including aspirin 81 mg and Plavix 75 mg  Confirmed patient has responsible adult to drive home post procedure and be with patient first 24 hours after arriving home.  Patient reports no new symptoms concerning for COVID-19 in the past 10 days.  Reviewed procedure instructions with patient.

## 2022-09-17 ENCOUNTER — Ambulatory Visit (HOSPITAL_COMMUNITY): Payer: BC Managed Care – PPO

## 2022-09-18 ENCOUNTER — Encounter (HOSPITAL_COMMUNITY)
Admission: RE | Disposition: A | Discharge status: Home / Self Care | Payer: Self-pay | Source: Ambulatory Visit | Attending: Cardiovascular Disease

## 2022-09-18 ENCOUNTER — Ambulatory Visit (HOSPITAL_COMMUNITY)
Admission: RE | Admit: 2022-09-18 | Discharge: 2022-09-19 | Disposition: A | Discharge status: Home / Self Care | Payer: BC Managed Care – PPO | Source: Ambulatory Visit | Attending: Cardiovascular Disease | Admitting: Cardiovascular Disease

## 2022-09-18 DIAGNOSIS — E785 Hyperlipidemia, unspecified: Secondary | ICD-10-CM | POA: Insufficient documentation

## 2022-09-18 DIAGNOSIS — I5022 Chronic systolic (congestive) heart failure: Secondary | ICD-10-CM | POA: Diagnosis not present

## 2022-09-18 DIAGNOSIS — E039 Hypothyroidism, unspecified: Secondary | ICD-10-CM | POA: Insufficient documentation

## 2022-09-18 DIAGNOSIS — I2089 Other forms of angina pectoris: Secondary | ICD-10-CM | POA: Diagnosis present

## 2022-09-18 DIAGNOSIS — Z87891 Personal history of nicotine dependence: Secondary | ICD-10-CM | POA: Diagnosis not present

## 2022-09-18 DIAGNOSIS — I110 Hypertensive heart disease with heart failure: Secondary | ICD-10-CM | POA: Diagnosis not present

## 2022-09-18 DIAGNOSIS — I2584 Coronary atherosclerosis due to calcified coronary lesion: Secondary | ICD-10-CM | POA: Insufficient documentation

## 2022-09-18 DIAGNOSIS — Z6839 Body mass index (BMI) 39.0-39.9, adult: Secondary | ICD-10-CM | POA: Insufficient documentation

## 2022-09-18 DIAGNOSIS — E119 Type 2 diabetes mellitus without complications: Secondary | ICD-10-CM | POA: Diagnosis not present

## 2022-09-18 DIAGNOSIS — I2582 Chronic total occlusion of coronary artery: Secondary | ICD-10-CM | POA: Insufficient documentation

## 2022-09-18 DIAGNOSIS — I25118 Atherosclerotic heart disease of native coronary artery with other forms of angina pectoris: Secondary | ICD-10-CM | POA: Insufficient documentation

## 2022-09-18 DIAGNOSIS — I480 Paroxysmal atrial fibrillation: Secondary | ICD-10-CM

## 2022-09-18 DIAGNOSIS — I25119 Atherosclerotic heart disease of native coronary artery with unspecified angina pectoris: Secondary | ICD-10-CM

## 2022-09-18 DIAGNOSIS — Z955 Presence of coronary angioplasty implant and graft: Secondary | ICD-10-CM | POA: Diagnosis not present

## 2022-09-18 DIAGNOSIS — Z79890 Hormone replacement therapy (postmenopausal): Secondary | ICD-10-CM | POA: Insufficient documentation

## 2022-09-18 HISTORY — PX: CORONARY STENT INTERVENTION: CATH118234

## 2022-09-18 HISTORY — PX: CORONARY ATHERECTOMY: CATH118238

## 2022-09-18 LAB — HEMOGLOBIN A1C
Hgb A1c MFr Bld: 7.4 % — ABNORMAL HIGH (ref 4.8–5.6)
Mean Plasma Glucose: 165.68 mg/dL

## 2022-09-18 LAB — POCT ACTIVATED CLOTTING TIME
Activated Clotting Time: 263 seconds
Activated Clotting Time: 251 seconds
Activated Clotting Time: 263 seconds

## 2022-09-18 LAB — GLUCOSE, CAPILLARY
Glucose-Capillary: 133 mg/dL — ABNORMAL HIGH (ref 70–99)
Glucose-Capillary: 79 mg/dL (ref 70–99)

## 2022-09-18 SURGERY — CORONARY ATHERECTOMY
Anesthesia: LOCAL

## 2022-09-18 MED ORDER — LIDOCAINE HCL (PF) 1 % IJ SOLN
INTRAMUSCULAR | Status: AC
  Filled 2022-09-18: qty 30

## 2022-09-18 MED ORDER — MIDAZOLAM HCL 2 MG/2ML IJ SOLN
INTRAMUSCULAR | Status: AC
  Filled 2022-09-18: qty 2

## 2022-09-18 MED ORDER — ISOSORBIDE MONONITRATE ER 30 MG PO TB24
30.0000 mg | ORAL_TABLET | Freq: Every day | ORAL | Status: DC
  Administered 2022-09-19: 30 mg via ORAL
  Filled 2022-09-18 (×2): qty 1

## 2022-09-18 MED ORDER — SODIUM CHLORIDE 0.9 % WEIGHT BASED INFUSION
1.0000 mL/kg/h | INTRAVENOUS | Status: AC
  Administered 2022-09-18: 1 mL/kg/h via INTRAVENOUS

## 2022-09-18 MED ORDER — SODIUM CHLORIDE 0.9% FLUSH: 3.0000 mL | INTRAVENOUS | Status: DC | PRN

## 2022-09-18 MED ORDER — VERAPAMIL HCL 2.5 MG/ML IV SOLN
INTRAVENOUS | Status: DC | PRN
  Administered 2022-09-18 (×3): 200 ug via INTRACORONARY
  Administered 2022-09-18 (×2): 100 ug via INTRACORONARY

## 2022-09-18 MED ORDER — ATORVASTATIN CALCIUM 80 MG PO TABS
80.0000 mg | ORAL_TABLET | Freq: Every day | ORAL | Status: DC
  Administered 2022-09-19: 80 mg via ORAL
  Filled 2022-09-18: qty 1

## 2022-09-18 MED ORDER — RIVAROXABAN 20 MG PO TABS: 20.0000 mg | ORAL_TABLET | Freq: Every day | ORAL | Status: DC

## 2022-09-18 MED ORDER — MIDAZOLAM HCL 2 MG/2ML IJ SOLN
INTRAMUSCULAR | Status: DC | PRN
  Administered 2022-09-18 (×2): 2 mg via INTRAVENOUS

## 2022-09-18 MED ORDER — SODIUM CHLORIDE 0.9 % WEIGHT BASED INFUSION
3.0000 mL/kg/h | INTRAVENOUS | Status: DC
  Administered 2022-09-18: 3 mL/kg/h via INTRAVENOUS

## 2022-09-18 MED ORDER — HEPARIN (PORCINE) IN NACL 1000-0.9 UT/500ML-% IV SOLN
INTRAVENOUS | Status: AC
  Filled 2022-09-18: qty 500

## 2022-09-18 MED ORDER — CLOPIDOGREL BISULFATE 75 MG PO TABS: 75.0000 mg | ORAL_TABLET | ORAL | Status: DC

## 2022-09-18 MED ORDER — SODIUM CHLORIDE 0.9 % WEIGHT BASED INFUSION: 1.0000 mL/kg/h | INTRAVENOUS | Status: DC

## 2022-09-18 MED ORDER — LISINOPRIL 10 MG PO TABS
10.0000 mg | ORAL_TABLET | Freq: Every day | ORAL | Status: DC
  Administered 2022-09-18 – 2022-09-19 (×2): 10 mg via ORAL
  Filled 2022-09-18 (×2): qty 1

## 2022-09-18 MED ORDER — FENTANYL CITRATE (PF) 100 MCG/2ML IJ SOLN
INTRAMUSCULAR | Status: AC
  Filled 2022-09-18: qty 2

## 2022-09-18 MED ORDER — HEPARIN SODIUM (PORCINE) 1000 UNIT/ML IJ SOLN
INTRAMUSCULAR | Status: AC
  Filled 2022-09-18: qty 10

## 2022-09-18 MED ORDER — METOCLOPRAMIDE HCL 10 MG PO TABS: 10.0000 mg | ORAL_TABLET | Freq: Four times a day (QID) | ORAL | Status: DC | PRN

## 2022-09-18 MED ORDER — METOPROLOL SUCCINATE ER 50 MG PO TB24
75.0000 mg | ORAL_TABLET | Freq: Every day | ORAL | Status: DC
  Administered 2022-09-19: 75 mg via ORAL
  Filled 2022-09-18: qty 1

## 2022-09-18 MED ORDER — LIDOCAINE HCL (PF) 1 % IJ SOLN
INTRAMUSCULAR | Status: DC | PRN
  Administered 2022-09-18: 2 mL

## 2022-09-18 MED ORDER — FENTANYL CITRATE (PF) 100 MCG/2ML IJ SOLN
INTRAMUSCULAR | Status: DC | PRN
  Administered 2022-09-18 (×2): 25 ug via INTRAVENOUS

## 2022-09-18 MED ORDER — DULOXETINE HCL 30 MG PO CPEP
30.0000 mg | ORAL_CAPSULE | Freq: Every day | ORAL | Status: DC
  Administered 2022-09-19: 30 mg via ORAL
  Filled 2022-09-18: qty 1

## 2022-09-18 MED ORDER — IOHEXOL 350 MG/ML SOLN
INTRAVENOUS | Status: DC | PRN
  Administered 2022-09-18: 130 mL

## 2022-09-18 MED ORDER — ONDANSETRON HCL 4 MG/2ML IJ SOLN: 4.0000 mg | Freq: Four times a day (QID) | INTRAMUSCULAR | Status: DC | PRN

## 2022-09-18 MED ORDER — ACETAMINOPHEN 500 MG PO TABS
1000.0000 mg | ORAL_TABLET | Freq: Four times a day (QID) | ORAL | Status: DC | PRN
  Administered 2022-09-18: 1000 mg via ORAL

## 2022-09-18 MED ORDER — CLOPIDOGREL BISULFATE 75 MG PO TABS
75.0000 mg | ORAL_TABLET | Freq: Every day | ORAL | Status: DC
  Administered 2022-09-19: 75 mg via ORAL
  Filled 2022-09-18: qty 1

## 2022-09-18 MED ORDER — VERAPAMIL HCL 2.5 MG/ML IV SOLN
INTRAVENOUS | Status: AC
  Filled 2022-09-18: qty 2

## 2022-09-18 MED ORDER — PANTOPRAZOLE SODIUM 40 MG PO TBEC
40.0000 mg | DELAYED_RELEASE_TABLET | Freq: Every day | ORAL | Status: DC
  Administered 2022-09-18 – 2022-09-19 (×2): 40 mg via ORAL
  Filled 2022-09-18 (×2): qty 1

## 2022-09-18 MED ORDER — VERAPAMIL HCL 2.5 MG/ML IV SOLN
INTRAVENOUS | Status: DC | PRN
  Administered 2022-09-18: 10 mL via INTRA_ARTERIAL

## 2022-09-18 MED ORDER — ASPIRIN 81 MG PO CHEW
81.0000 mg | CHEWABLE_TABLET | Freq: Every day | ORAL | Status: DC
  Administered 2022-09-19: 81 mg via ORAL
  Filled 2022-09-18: qty 1

## 2022-09-18 MED ORDER — HYDRALAZINE HCL 20 MG/ML IJ SOLN
INTRAMUSCULAR | Status: AC
  Filled 2022-09-18: qty 1

## 2022-09-18 MED ORDER — HEPARIN SODIUM (PORCINE) 1000 UNIT/ML IJ SOLN
INTRAMUSCULAR | Status: DC | PRN
  Administered 2022-09-18 (×2): 4000 [IU] via INTRAVENOUS
  Administered 2022-09-18: 10000 [IU] via INTRAVENOUS

## 2022-09-18 MED ORDER — ACETAMINOPHEN 500 MG PO TABS
ORAL_TABLET | ORAL | Status: AC
  Filled 2022-09-18: qty 2

## 2022-09-18 MED ORDER — SODIUM CHLORIDE 0.9 % IV SOLN: 250.0000 mL | INTRAVENOUS | Status: DC | PRN

## 2022-09-18 MED ORDER — NITROGLYCERIN 1 MG/10 ML FOR IR/CATH LAB
INTRA_ARTERIAL | Status: AC
  Filled 2022-09-18: qty 10

## 2022-09-18 MED ORDER — SODIUM CHLORIDE 0.9% FLUSH: 3.0000 mL | Freq: Two times a day (BID) | INTRAVENOUS | Status: DC

## 2022-09-18 MED ORDER — GLIPIZIDE ER 5 MG PO TB24
5.0000 mg | ORAL_TABLET | Freq: Every day | ORAL | Status: DC
  Administered 2022-09-19: 5 mg via ORAL
  Filled 2022-09-18: qty 1

## 2022-09-18 MED ORDER — LABETALOL HCL 5 MG/ML IV SOLN
10.0000 mg | INTRAVENOUS | Status: AC | PRN
  Administered 2022-09-18 (×2): 10 mg via INTRAVENOUS

## 2022-09-18 MED ORDER — ASPIRIN 81 MG PO CHEW: 81.0000 mg | CHEWABLE_TABLET | ORAL | Status: DC

## 2022-09-18 MED ORDER — LABETALOL HCL 5 MG/ML IV SOLN
INTRAVENOUS | Status: AC
  Filled 2022-09-18: qty 4

## 2022-09-18 MED ORDER — HYDRALAZINE HCL 20 MG/ML IJ SOLN: 10.0000 mg | INTRAMUSCULAR | Status: AC | PRN

## 2022-09-18 MED ORDER — HEPARIN (PORCINE) IN NACL 1000-0.9 UT/500ML-% IV SOLN
INTRAVENOUS | Status: DC | PRN
  Administered 2022-09-18 (×2): 500 mL

## 2022-09-18 MED ORDER — INSULIN ASPART 100 UNIT/ML IJ SOLN
0.0000 [IU] | Freq: Three times a day (TID) | INTRAMUSCULAR | Status: DC
  Administered 2022-09-19: 2 [IU] via SUBCUTANEOUS

## 2022-09-18 MED ORDER — NITROGLYCERIN 1 MG/10 ML FOR IR/CATH LAB
INTRA_ARTERIAL | Status: DC | PRN
  Administered 2022-09-18: 150 ug via INTRACORONARY
  Administered 2022-09-18: 200 ug via INTRACORONARY
  Administered 2022-09-18: 150 ug via INTRACORONARY
  Administered 2022-09-18: 200 ug via INTRACORONARY

## 2022-09-18 SURGICAL SUPPLY — 23 items
BALL SAPPHIRE NC24 3.25X18 (BALLOONS) ×1
BALLN SAPPHIRE 2.5X20 (BALLOONS) ×1
BALLOON SAPPHIRE 2.5X20 (BALLOONS) IMPLANT
BALLOON SAPPHIRE NC24 3.25X18 (BALLOONS) IMPLANT
CATH INFINITI JR4 5F (CATHETERS) IMPLANT
CATH LAUNCHER 6FR EBU3.5 (CATHETERS) IMPLANT
CROWN DIAMONDBACK CLASSIC 1.25 (BURR) IMPLANT
DEVICE RAD COMP TR BAND LRG (VASCULAR PRODUCTS) IMPLANT
ELECT DEFIB PAD ADLT CADENCE (PAD) IMPLANT
GLIDESHEATH SLEND SS 6F .021 (SHEATH) IMPLANT
GUIDEWIRE INQWIRE 1.5J.035X260 (WIRE) IMPLANT
INQWIRE 1.5J .035X260CM (WIRE) ×1
KIT ENCORE 26 ADVANTAGE (KITS) IMPLANT
KIT HEART LEFT (KITS) ×1 IMPLANT
LUBRICANT VIPERSLIDE CORONARY (MISCELLANEOUS) IMPLANT
PACK CARDIAC CATHETERIZATION (CUSTOM PROCEDURE TRAY) ×1 IMPLANT
STENT SYNERGY XD 2.75X32 (Permanent Stent) IMPLANT
SYNERGY XD 2.75X32 (Permanent Stent) ×1 IMPLANT
TRANSDUCER W/STOPCOCK (MISCELLANEOUS) ×1 IMPLANT
TUBING CIL FLEX 10 FLL-RA (TUBING) ×1 IMPLANT
VALVE GUARDIAN II ~~LOC~~ HEMO (MISCELLANEOUS) IMPLANT
WIRE COUGAR XT STRL 190CM (WIRE) IMPLANT
WIRE VIPERWIRE COR FLEX .012 (WIRE) IMPLANT

## 2022-09-18 NOTE — Interval H&P Note (Signed)
Cath Lab Visit (complete for each Cath Lab visit)  Clinical Evaluation Leading to the Procedure:   ACS: No.  Non-ACS:    Anginal Classification: CCS III  Anti-ischemic medical therapy: Maximal Therapy (2 or more classes of medications)  Non-Invasive Test Results: No non-invasive testing performed  Prior CABG: No previous CABG      History and Physical Interval Note:  09/18/2022 11:41 AM  Kelly Lang  has presented today for surgery, with the diagnosis of cad.  The various methods of treatment have been discussed with the patient and family. After consideration of risks, benefits and other options for treatment, the patient has consented to  Procedure(s): Coronary Thrombectomy (N/A) as a surgical intervention.  The patient's history has been reviewed, patient examined, no change in status, stable for surgery.  I have reviewed the patient's chart and labs.  Questions were answered to the patient's satisfaction.     Sherren Mocha

## 2022-09-18 NOTE — Plan of Care (Signed)

## 2022-09-19 ENCOUNTER — Encounter (HOSPITAL_COMMUNITY): Payer: Self-pay | Admitting: Cardiovascular Disease

## 2022-09-19 ENCOUNTER — Other Ambulatory Visit: Payer: Self-pay

## 2022-09-19 ENCOUNTER — Ambulatory Visit (HOSPITAL_COMMUNITY): Payer: BC Managed Care – PPO

## 2022-09-19 DIAGNOSIS — I2089 Other forms of angina pectoris: Secondary | ICD-10-CM

## 2022-09-19 DIAGNOSIS — I2582 Chronic total occlusion of coronary artery: Secondary | ICD-10-CM | POA: Diagnosis not present

## 2022-09-19 DIAGNOSIS — E039 Hypothyroidism, unspecified: Secondary | ICD-10-CM | POA: Diagnosis not present

## 2022-09-19 DIAGNOSIS — I25118 Atherosclerotic heart disease of native coronary artery with other forms of angina pectoris: Secondary | ICD-10-CM | POA: Diagnosis not present

## 2022-09-19 DIAGNOSIS — I480 Paroxysmal atrial fibrillation: Secondary | ICD-10-CM | POA: Diagnosis not present

## 2022-09-19 DIAGNOSIS — I2584 Coronary atherosclerosis due to calcified coronary lesion: Secondary | ICD-10-CM | POA: Diagnosis not present

## 2022-09-19 LAB — BASIC METABOLIC PANEL
Anion gap: 7 (ref 5–15)
BUN: 6 mg/dL (ref 6–20)
CO2: 23 mmol/L (ref 22–32)
Calcium: 8.5 mg/dL — ABNORMAL LOW (ref 8.9–10.3)
Chloride: 107 mmol/L (ref 98–111)
Creatinine, Ser: 0.63 mg/dL (ref 0.44–1.00)
GFR, Estimated: >60 mL/min (ref >60)
Glucose, Bld: 111 mg/dL — ABNORMAL HIGH (ref 70–99)
Potassium: 3.9 mmol/L (ref 3.5–5.1)
Sodium: 137 mmol/L (ref 135–145)

## 2022-09-19 LAB — CBC
HCT: 36.2 % (ref 36.0–46.0)
Hemoglobin: 12 g/dL (ref 12.0–15.0)
MCH: 31.7 pg (ref 26.0–34.0)
MCHC: 33.1 g/dL (ref 30.0–36.0)
MCV: 95.8 fL (ref 80.0–100.0)
Platelets: 211 10*3/uL (ref 150–400)
RBC: 3.78 MIL/uL — ABNORMAL LOW (ref 3.87–5.11)
RDW: 12.9 % (ref 11.5–15.5)
WBC: 6 10*3/uL (ref 4.0–10.5)
nRBC: 0 % (ref 0.0–0.2)

## 2022-09-19 LAB — GLUCOSE, CAPILLARY
Glucose-Capillary: 126 mg/dL — ABNORMAL HIGH (ref 70–99)
Glucose-Capillary: 114 mg/dL — ABNORMAL HIGH (ref 70–99)
Glucose-Capillary: 130 mg/dL — ABNORMAL HIGH (ref 70–99)

## 2022-09-19 MED ORDER — ASPIRIN 81 MG PO CHEW: 81.0000 mg | CHEWABLE_TABLET | Freq: Every day | ORAL | 0 refills | Status: AC

## 2022-09-19 NOTE — Progress Notes (Signed)
CARDIAC REHAB PHASE I   PRE:  Rate/Rhythm: 77 SR  BP:  Sitting: 133/80      SaO2: 97 RA  MODE:  Ambulation: 470 ft   POST:  Rate/Rhythm: 83 SR  BP:  Sitting: 119/108      SaO2: 98 RA    Pt ambulated in hall independently with no cp or sob. Returned to room to bed with call bell and bedside table in reach. Post stent education including heart healthy diabetic diet, risk factors, restrictions, exercise guidelines, antiplatelet therapy importance, site care and CRP2 reviewed. All questions and concerns addressed. Will refer to [facility] for CRP2.   [id number]  Vanessa Barbara, RN BSN 09/19/2022 9:16 AM

## 2022-09-19 NOTE — Discharge Summary (Cosign Needed)
Discharge Summary    Patient ID: Mairim Bade MRN: [id number]; DOB: 03/09/1961  Admit date: 09/18/2022 Discharge date: 09/19/2022  PCP:  Ladell Pier, MD   [place] Prov[facility] Cardiologist:  Mertie Moores, MD  Electrophysiologist:  Thompson Grayer, MD       Discharge Diagnoses    Principal Problem:   Exertional angina Active Problems:   PAF (paroxysmal atrial fibrillation) [facility])    Diagnostic Studies/Procedures    09/18/22 PCI and Atherectomy    Prox LAD to Mid LAD lesion is 100% stenosed.   RPDA lesion is 20% stenosed.   A drug-eluting stent was successfully placed using a SYNERGY XD 2.75X32.   Post intervention, there is a 0% residual stenosis.   Successful atherectomy and stenting of subtotal 99% proximal LAD stenosis with heavy calcification using orbital atherectomy with a CSI classic crown and stenting with a 2.75 x 32 mm Synergy DES.  Procedure complicated by distal embolization with apical occlusion of the LAD at the completion of the procedure.  Otherwise TIMI-3 flow is present throughout.  Recommend overnight observation and would continue DAPT with aspirin and clopidogrel for at least 12 months.  Diagnostic Dominance: Right  Intervention   _____________   History of Present Illness     Bethanie Bloxom is a 61 y.o. female with medical history of T2DM, HL, thyroid disease, atrial fibrillation, CAD.  Patient was noted to have subtotal occlusion of the proximal LAD in July of this year.  PCI was attempted at that time but was unsuccessful due to an inability to pass a balloon across the lesion.  Her LAD is collateralized from the RCA but despite medical therapy she continued to have CCS class III angina.  Patient presenting for repeat attempt at PCI with anticipation of atherectomy.  Hospital Course      On 10/19 patient presented to [facility] for planned PCI/atherectomy of known LAD lesion.  Vascular access was obtained via the right wrist.   Angiography demonstrated a patent LAD and subtotal stenosis in the proximal vessel.  This time the patient's lesion was able to be crossed and a 2.75x32 mm Synergy DES deployed.  Following deployment of the stent, there is TIMI-3 flow throughout the LAD.  Patient subsequently taken to post-cath recovery area for monitoring with a TR band in place.  This morning patient reports no pain or swelling at the site of vascular access on her right wrist.  She is without chest pain or shortness of breath.  Discussed plans triple therapy with ASA/Plavix/Xarelto for 1 month then dropping ASA to continue with Plavix/Xarelto only.  Confirmed the patient has Plavix available to take at home. Vascular access wound care discussed with patient who confirms understanding.      Did the patient have an acute coronary syndrome (MI, NSTEMI, STEMI, etc) this admission?:  No                               Did the patient have a percutaneous coronary intervention (stent / angioplasty)?:  Yes.     Cath/PCI Registry Performance & Quality Measures: Aspirin prescribed? - Yes ADP Receptor Inhibitor (Plavix/Clopidogrel, Brilinta/Ticagrelor or Effient/Prasugrel) prescribed (includes medically managed patients)? - Yes High Intensity Statin (Lipitor 40-24m or Crestor 20-438m prescribed? - Yes For EF <40%, was ACEI/ARB prescribed? - Yes (already taking) For EF <40%, Aldosterone Antagonist (Spironolactone or Eplerenone) prescribed? - Not Applicable (EF >/= 4096%Cardiac Rehab Phase II ordered? -  No - staged PCI          _____________  Discharge Vitals Blood pressure 133/80, pulse 66, temperature 97.9 F (36.6 C), temperature source Oral, resp. rate 18, height _0  (1.651 m), weight 103.9 kg, last menstrual period 04/15/2015, SpO2 94 %.  Filed Weights   09/18/22 1003  Weight: 103.9 kg    Labs & Radiologic Studies    CBC Recent Labs    09/19/22 0159  WBC 6.0  HGB 12.0  HCT 36.2  MCV 95.8  PLT 833   Basic  Metabolic Panel Recent Labs    09/19/22 0159  NA 137  K 3.9  CL 107  CO2 23  GLUCOSE 111*  BUN 6  CREATININE 0.63  CALCIUM 8.5*   Liver Function Tests No results for input(s): "AST", "ALT", "ALKPHOS", "BILITOT", "PROT", "ALBUMIN" in the last 72 hours. No results for input(s): "LIPASE", "AMYLASE" in the last 72 hours. High Sensitivity Troponin:   No results for input(s): "TROPONINIHS" in the last 720 hours.  BNP Invalid input(s): "POCBNP" D-Dimer No results for input(s): "DDIMER" in the last 72 hours. Hemoglobin A1C Recent Labs    09/18/22 1848  HGBA1C 7.4*   Fasting Lipid Panel No results for input(s): "CHOL", "HDL", "LDLCALC", "TRIG", "CHOLHDL", "LDLDIRECT" in the last 72 hours. Thyroid Function Tests No results for input(s): "TSH", "T4TOTAL", "T3FREE", "THYROIDAB" in the last 72 hours.  Invalid input(s): "FREET3" _____________  CARDIAC CATHETERIZATION  Result Date: 09/18/2022   Prox LAD to Mid LAD lesion is 100% stenosed.   RPDA lesion is 20% stenosed.   A drug-eluting stent was successfully placed using a SYNERGY XD 2.75X32.   Post intervention, there is a 0% residual stenosis. Successful atherectomy and stenting of subtotal 99% proximal LAD stenosis with heavy calcification using orbital atherectomy with a CSI classic crown and stenting with a 2.75 x 32 mm Synergy DES.  Procedure complicated by distal embolization with apical occlusion of the LAD at the completion of the procedure.  Otherwise TIMI-3 flow is present throughout.  Recommend overnight observation and would continue DAPT with aspirin and clopidogrel for at least 12 months.   Disposition   Pt is being discharged home today in good condition.  Follow-up Plans & Appointments     Follow-up Information     Emmaline Life, NP Follow up on 10/03/2022.   Specialty: Nurse Practitioner Why: Follow up appointment at 8:25am with Christen Bame (one of Dr. Elmarie Shiley NP's). Contact information: [address] [place] [address] [phone]                Discharge Instructions     Amb Referral to Cardiac Rehabilitation   Complete by: As directed    Diagnosis: Coronary Stents   After initial evaluation and assessments completed: Virtual Based Care may be provided alone or in conjunction with Phase 2 Cardiac Rehab based on patient barriers.: Yes   Intensive Cardiac Rehabilitation (ICR) [place] location only OR Traditional Cardiac Rehabilitation (TCR) *If criteria for ICR are not met will enroll in TCR [facility] only): Yes   Diet - low sodium heart healthy   Complete by: As directed    Discharge instructions   Complete by: As directed    NO HEAVY LIFTING OR SEXUAL ACTIVITY X 7 DAYS. NO DRIVING X 2-3 DAYS. NO SOAKING BATHS, HOT TUBS, POOLS, ETC., X 7 DAYS.   Increase activity slowly   Complete by: As directed         Discharge  Medications   Allergies as of 09/19/2022       Reactions   Jardiance [empagliflozin] Other (See Comments)   Yeast infections   Codeine Itching, Rash        Medication List     TAKE these medications    acetaminophen 500 MG tablet Commonly known as: TYLENOL Take 1,000 mg by mouth every 6 (six) hours as needed for headache.   aspirin 81 MG chewable tablet Chew 1 tablet (81 mg total) by mouth daily. Stop taking after 30 days. Start taking on: September 20, 2022   atorvastatin 80 MG tablet Commonly known as: LIPITOR Take 1 tablet (80 mg total) by mouth daily.   clopidogrel 75 MG tablet Commonly known as: PLAVIX Take 1 tablet (75 mg total) by mouth daily with breakfast.   Dexcom G7 Receiver Devi Use to monitor BG continuously   Dexcom G7 Sensor Misc CHANGE SENSOR EVERY 10 DAYS   diclofenac Sodium 1 % Gel Commonly known as: VOLTAREN Apply 2 g topically 4 (four) times daily. What changed:  when to take this reasons to take this   DULoxetine 30 MG capsule Commonly known as: CYMBALTA Take 1 capsule (30 mg total) by mouth  daily.   glipiZIDE 5 MG 24 hr tablet Commonly known as: GLUCOTROL XL Take 1 tablet by mouth once daily with breakfast   isosorbide mononitrate 30 MG 24 hr tablet Commonly known as: IMDUR Take 1 tablet (30 mg total) by mouth daily.   lisinopril 10 MG tablet Commonly known as: ZESTRIL Take 1 tablet (10 mg total) by mouth daily.   metFORMIN 1000 MG tablet Commonly known as: GLUCOPHAGE Take 1 tablet (1,000 mg total) by mouth 2 (two) times daily with a meal.   metoCLOPramide 10 MG tablet Commonly known as: REGLAN Take 1 tablet (10 mg total) by mouth every 6 (six) hours. What changed:  when to take this reasons to take this   metoprolol succinate 25 MG 24 hr tablet Commonly known as: TOPROL-XL Take 3 tablets (75 mg total) by mouth daily.   OneTouch Delica Lancets 95J Misc Use as instructed to check blood sugar up to 3 times daily.   OneTouch Verio test strip Generic drug: glucose blood USE STRIPS TO CHECK GLUCOSE UP TO THREE TIMES DAILY AS DIRECTED   Ozempic (2 MG/DOSE) 8 MG/3ML Sopn Generic drug: Semaglutide (2 MG/DOSE) INJECT 2MG SUBCUTANEOUSLY ONCE A WEEK   pantoprazole 40 MG tablet Commonly known as: PROTONIX Take 1 tablet (40 mg total) by mouth daily.   rivaroxaban 20 MG Tabs tablet Commonly known as: Xarelto Take 1 tablet (20 mg total) by mouth daily with supper.   Semglee (yfgn) 100 UNIT/ML Pen Generic drug: insulin glargine-yfgn INJECT 80 UNITS SUBCUTANEOUSLY  AT BEDTIME   Skyrizi Pen 150 MG/ML Soaj Generic drug: Risankizumab-rzaa Inject 150 mg into the skin every 3 (three) months.   TRUEplus Pen Needles 32G X 4 MM Misc Generic drug: Insulin Pen Needle USE AS DIRECTED AT BEDTIME.           Outstanding Labs/Studies     Duration of Discharge Encounter   Greater than 30 minutes including physician time.  Signed, Lily Kocher, PA-C 09/19/2022, 1:25 PM   Patient seen and examined.  Agree with above documentation.  She underwent successful  PCI/atherectomy of LAD lesion on 10/19.  Did have distal embolization with apical occlusion of the LAD but otherwise TIMI-3 flow.  Plan to continue aspirin plus Plavix plus Xarelto x1 month then drop aspirin and  continue with Plavix/Xarelto only.  Patient denies any chest pain or dyspnea currently.  GEN: Well nourished, well developed, in no acute distress HEENT: normal Neck: no JVD, carotid bruits, or masses Cardiac: RRR; no murmurs, rubs, or gallops,no edema  Respiratory:  clear to auscultation bilaterally, normal work of breathing GI: soft, nontender MS: no deformity or atrophy Skin: warm and dry, no rash Neuro:  Alert and Oriented x 3 Psych: normal affect  Donato Heinz, MD

## 2022-09-22 ENCOUNTER — Ambulatory Visit: Payer: 59 | Admitting: "Endocrinology

## 2022-09-22 ENCOUNTER — Ambulatory Visit (HOSPITAL_COMMUNITY): Payer: BC Managed Care – PPO

## 2022-09-24 ENCOUNTER — Ambulatory Visit (HOSPITAL_COMMUNITY): Payer: BC Managed Care – PPO

## 2022-09-24 ENCOUNTER — Ambulatory Visit: Payer: Self-pay | Admitting: "Endocrinology

## 2022-09-26 ENCOUNTER — Ambulatory Visit (HOSPITAL_COMMUNITY): Payer: BC Managed Care – PPO

## 2022-09-29 ENCOUNTER — Ambulatory Visit (HOSPITAL_COMMUNITY): Payer: BC Managed Care – PPO

## 2022-10-01 ENCOUNTER — Ambulatory Visit (HOSPITAL_COMMUNITY): Payer: BC Managed Care – PPO

## 2022-10-01 NOTE — Progress Notes (Signed)
Cardiology Office Note:    Date:  10/03/2022   ID:  [name], DOB [birth year], MRN [id number]  PCP:  Ladell Pier, MD   [facility] Providers Cardiologist:  Mertie Moores, MD Electrophysiologist:  Thompson Grayer, MD     Referring MD: Ladell Pier, MD   Chief Complaint: follow-up post  History of Present Illness:    Kelly Lang is a pleasant 61 y.o. female with a hx of persistent atrial fibrillation on chronic anticoagulation, chronic HFpEF, hypertension, CVA, hyperlipidemia, type 2 diabetes, morbid obesity, and former tobacco abuse.   Diagnosed with atrial fibrillation 2014. Nuclear stress test negative for ischemia in 2015.  On flecainide 2015, stopped in 2018 with failure to maintain NSR. PVI ablation 06/30/2017.  CVA 09/2015 in the setting of Coumadin/flecainide noncompliance.  Her insurance was not paying for flecainide. Echo at that time with LVEF 20 to 25%, she was placed on Xarelto and metoprolol.  Seen in our office on 03/31/2022 by Dr. Acie Fredrickson at which time she was maintaining sinus rhythm. Seen by Tommye Standard, PA on 5/3 for EP follow-up. She was agreeable to continue to follow-up with Dr. Acie Fredrickson and with EP as needed.   Admission 7/22-7/26/23 exertional dyspnea, chest pain, nausea and vomiting. Cardiology consulted. She initially thought it was due to gastroparesis. BNP mildly elevated to 295. Hs tropoinin elevated to a peak of 106.  On EKG she had no acute ischemic changes but did have multiple PVCs.  Echo this admission revealed LVEF 45 to 50% with septal and apical inferior hypokinesis that was not present on echo 12/2017. Chest CT showed no pulmonary embolism but she did have moderate coronary atherosclerosis.  Nuclear stress test and ETT in the past were low risk but no previous definitive assessment of her coronaries. Q waves present on septal leads on EKG. Recommended to proceed with LHC by Dr. Oval Linsey which was performed on 06/24/2022.  LHC revealed 100%  occlusion of proximal to mid LAD within the heavily calcified segment.  Right to left collaterals fill the LAD retrograde.  Thrombus noted beyond the high-grade obstruction in the proximal vessel.  Widely patent left main, circumflex, and right coronary.  EF 45 to 50%, LVEDP 5 mmHg.  Consideration of repeat attempt at PCI after several days to allow thrombus resolution versus medical therapy for 6 weeks and return to CTO team for consideration of PCI.  Per Dr. Martinique on 06/25/2022 patient was asymptomatic on medical therapy. LAD collateralized from the RCA. Recommend continued medical therapy.  Indication for repeat attempted PCI would be refractory angina despite medical therapy.  Seen by me on 07/02/22 for hospital follow-up, accompanied by a friend. Feeling "tired,  weak and shaky." Having headaches that occur in the afternoon. 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 No chest pain. Gets a little out of breath walking. Felt a poking sensation in left chest for a short duration yesterday, no further episodes like this.  States that she has noticed her hands trembling since hospital discharge. New medications for her are Imdur and Plavix.  Her metoprolol dose was increased.  Blood sugar readings have been within normal range during this time.  Has not been monitoring BP at home, but her friend is a Marine scientist and will start to do so. She denies lower extremity edema, fatigue, palpitations, melena, hematuria, hemoptysis, diaphoresis, presyncope, syncope, orthopnea, and PND. Advised to gradually increase activity and return in 2 months,  sooner if more symptomatic.   Seen by Dr. Acie Fredrickson 09/09/22 with concerns for intrascapular pain and SOB with exertion.  Due to previous findings on catheterization, she was scheduled for PCI/CSI atherectomy of her proximal LAD. On 10/19 she underwent successful atherectomy and stenting of subtotal 99% proximal LAD  stenosis with heavy calcification using orbital atherectomy with a CSI classic crown and stenting with a 2.75 x 32 mm Synergy DES. Procedure complicated by distal embolization with apical occlusion of the LAD at the completion of the procedure.  Overnight observation and DAPT with aspirin and clopidogrel for at least 12 months.   Today, she is here alone for follow-up. Reports she is feeling better since PCI/DES. 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 She denies chest pain, shortness of breath, lower extremity edema, fatigue, palpitations, melena, hematuria, hemoptysis, diaphoresis, weakness, presyncope, syncope, orthopnea, and PND. She is aware to stop aspirin on 11/20 and continue Plavix and Xarelto. No bleeding problems currently. PVC on EKG and auscultated during exam, she is unaware of these. She has no specific cardiac concerns today.   Past Medical History:  Diagnosis Date   Arthritis    "all over" (06/30/2017)   Chicken pox    Dyslipidemia    Dysrhythmia ablation 2018   a-fib   GERD (gastroesophageal reflux disease)    H/O: hypothyroidism    a. thyroid biopsy 1996 with synthroid. Stopped taking rx and was loss to follow up b. normal thyroid function 10/20/13   High cholesterol    Hx of cardiovascular stress test    ETT/Lexiscan Myoview (12/2013):  No ischemia; not gated; low risk.   Hypertension    Obesity    Persistent atrial fibrillation ([place])    a diagnosed 10/25/2013   Psoriasis    on Skyrizi   Seasonal allergies    Stroke [facility]) 2016   "some speech problems since" (06/30/2017)   Tachycardia induced cardiomyopathy (HCC)    a. 2/2 Afib with RVR for unknown duration (EF: 40-45%)   Thyroid disease    Type II diabetes mellitus ([place])    a. hg A1c 10, newly diagnosed (10/26/13)    Past Surgical History:  Procedure Laterality Date   ATRIAL FIBRILLATION ABLATION  06/30/2017   ATRIAL FIBRILLATION ABLATION N/A 06/30/2017    Procedure: Atrial Fibrillation Ablation;  Surgeon: Thompson Grayer, MD;  Location: [place] CV LAB;  Service: Cardiovascular;  Laterality: N/A;   BICEPT TENODESIS Right 05/24/2020   Procedure: BICEPS TENODESIS;  Surgeon: Meredith Pel, MD;  Location: [place];  Service: Orthopedics;  Laterality: Right;   BIOPSY THYROID  1996   CARDIOVERSION N/A 12/07/2013   Procedure: CARDIOVERSION;  Surgeon: Casandra Doffing, MD;  Location: [facility] ENDOSCOPY;  Service: Cardiovascular;  Laterality: N/A;   CARDIOVERSION N/A 03/10/2016   Procedure: CARDIOVERSION;  Surgeon: Thayer Headings, MD;  Location[facility] ENDOSCOPY;  Service: Cardiovascular;  Laterality: N/A;   CORONARY ATHERECTOMY N/A 09/18/2022   Procedure: CORONARY ATHERECTOMY;  Surgeon: Sherren Mocha, MD;  Location: [place] CV LAB;  Service: Cardiovascular;  Laterality: N/A;   CORONARY BALLOON ANGIOPLASTY N/A 06/24/2022   Procedure: CORONARY BALLOON ANGIOPLASTY;  Surgeon: Belva Crome, MD;  Location: [place] CV LAB;  Service: Cardiovascular;  Laterality: N/A;   CORONARY STENT INTERVENTION N/A 09/18/2022   Procedure: CORONARY STENT INTERVENTION;  Surgeon: Sherren Mocha, MD;  Location: [place] CV LAB;  Service: Cardiovascular;  Laterality: N/A;   ESOPHAGOGASTRODUODENOSCOPY N/A  12/07/2017   Procedure: ESOPHAGOGASTRODUODENOSCOPY (EGD);  Surgeon: Irene Shipper, MD;  Location: [facility] ENDOSCOPY;  Service: Endoscopy;  Laterality: N/A;   EYE MUSCLE SURGERY Right ~ 1966   LEFT HEART CATH AND CORONARY ANGIOGRAPHY N/A 06/24/2022   Procedure: LEFT HEART CATH AND CORONARY ANGIOGRAPHY;  Surgeon: Belva Crome, MD;  Location: [place] CV LAB;  Service: Cardiovascular;  Laterality: N/A;   SHOULDER ARTHROSCOPY WITH ROTATOR CUFF REPAIR Right 05/24/2020   Procedure: right shoulder arthroscopy, rotator cuff tear repair biceps tenodesis;  Surgeon: Meredith Pel, MD;  Location: [place];  Service: Orthopedics;  Laterality: Right;    TEE WITHOUT CARDIOVERSION N/A 06/29/2017   Procedure: TRANSESOPHAGEAL ECHOCARDIOGRAM (TEE);  Surgeon: Fay Records, MD;  Location: [facility] ENDOSCOPY;  Service: Cardiovascular;  Laterality: N/A;   TONSILLECTOMY  1971    Current Medications: Current Meds  Medication Sig   acetaminophen (TYLENOL) 500 MG tablet Take 1,000 mg by mouth every 6 (six) hours as needed for headache.   aspirin 81 MG chewable tablet Chew 1 tablet (81 mg total) by mouth daily. Stop taking after 30 days.   atorvastatin (LIPITOR) 80 MG tablet Take 1 tablet (80 mg total) by mouth daily.   clopidogrel (PLAVIX) 75 MG tablet Take 1 tablet (75 mg total) by mouth daily with breakfast.   Continuous Blood Gluc Receiver (DEXCOM G7 RECEIVER) DEVI Use to monitor BG continuously   Continuous Blood Gluc Sensor (DEXCOM G7 SENSOR) MISC CHANGE SENSOR EVERY 10 DAYS   diclofenac Sodium (VOLTAREN) 1 % GEL Apply 2 g topically 4 (four) times daily. (Patient taking differently: Apply 2 g topically daily as needed (pain).)   DULoxetine (CYMBALTA) 30 MG capsule Take 1 capsule (30 mg total) by mouth daily.   glipiZIDE (GLUCOTROL XL) 5 MG 24 hr tablet Take 1 tablet by mouth once daily with breakfast   isosorbide mononitrate (IMDUR) 30 MG 24 hr tablet Take 1 tablet (30 mg total) by mouth daily.   lisinopril (ZESTRIL) 10 MG tablet Take 1 tablet (10 mg total) by mouth daily.   metFORMIN (GLUCOPHAGE) 1000 MG tablet Take 1 tablet (1,000 mg total) by mouth 2 (two) times daily with a meal.   metoCLOPramide (REGLAN) 10 MG tablet Take 1 tablet (10 mg total) by mouth every 6 (six) hours. (Patient taking differently: Take 10 mg by mouth every 6 (six) hours as needed (gastroparesis).)   metoprolol succinate (TOPROL-XL) 25 MG 24 hr tablet Take 3 tablets (75 mg total) by mouth daily.   ONETOUCH DELICA LANCETS 41R MISC Use as instructed to check blood sugar up to 3 times daily.   ONETOUCH VERIO test strip USE STRIPS TO CHECK GLUCOSE UP TO THREE TIMES DAILY AS DIRECTED    OZEMPIC, 2 MG/DOSE, 8 MG/3ML SOPN INJECT 2MG SUBCUTANEOUSLY ONCE A WEEK   pantoprazole (PROTONIX) 40 MG tablet Take 1 tablet (40 mg total) by mouth daily.   rivaroxaban (XARELTO) 20 MG TABS tablet Take 1 tablet (20 mg total) by mouth daily with supper.   SEMGLEE, YFGN, 100 UNIT/ML Pen INJECT 80 UNITS SUBCUTANEOUSLY  AT BEDTIME   SKYRIZI PEN 150 MG/ML SOAJ Inject 150 mg into the skin every 3 (three) months.   TRUEPLUS PEN NEEDLES 32G X 4 MM MISC USE AS DIRECTED AT BEDTIME.     Allergies:   Jardiance [empagliflozin] and Codeine   Social History   Socioeconomic History   Marital status: Divorced    Spouse name: Not on file   Number of children: 0  Years of education: 53   Highest education level: Not on file  Occupational History   Occupation: Chief Technology Officer  Tobacco Use   Smoking status: Former    Packs/day: 1.00    Years: 22.00    Total pack years: 22.00    Types: Cigarettes    Quit date: 12/01/2010    Years since quitting: 11.8   Smokeless tobacco: Never  Vaping Use   Vaping Use: Never used  Substance and Sexual Activity   Alcohol use: Yes    Comment: social   Drug use: No   Sexual activity: Not Currently  Other Topics Concern   Not on file  Social History Narrative   Moved to [place] from [place] in 2002.     Fun: shop, sew, decorate   Denies any beliefs effecting healthcare   Social Determinants of Health   Financial Resource Strain: Not on file  Food Insecurity: No Food Insecurity (09/19/2022)   Hunger Vital Sign    Worried About Running Out of Food in the Last Year: Never true    Ran Out of Food in the Last Year: Never true  Transportation Needs: No Transportation Needs (09/19/2022)   PRAPARE - Hydrologist (Medical): No    Lack of Transportation (Non-Medical): No  Physical Activity: Not on file  Stress: Not on file  Social Connections: Not on file     Family History: The patient's family history includes  Hyperlipidemia in her mother; Hypertension in her mother. She was adopted.  ROS:   Please see the history of present illness.  All other systems reviewed and are negative.  Labs/Other Studies Reviewed:    The following studies were reviewed today:  Coronary atherectomy 09/18/22    Prox LAD to Mid LAD lesion is 100% stenosed.   RPDA lesion is 20% stenosed.   A drug-eluting stent was successfully placed using a SYNERGY XD 2.75X32.   Post intervention, there is a 0% residual stenosis.   Successful atherectomy and stenting of subtotal 99% proximal LAD stenosis with heavy calcification using orbital atherectomy with a CSI classic crown and stenting with a 2.75 x 32 mm Synergy DES.  Procedure complicated by distal embolization with apical occlusion of the LAD at the completion of the procedure.  Otherwise TIMI-3 flow is present throughout.  Recommend overnight observation and would continue DAPT with aspirin and clopidogrel for at least 12 months.  Echo 06/23/22   1. Septal , inferior apical hypokinesis . Left ventricular ejection  fraction, by estimation, is 45 to 50%. The left ventricle has mildly  decreased function. The left ventricle demonstrates regional wall motion  abnormalities (see scoring diagram/findings   for description). There is mild left ventricular hypertrophy. Left  ventricular diastolic parameters were normal.   2. Right ventricular systolic function is normal. The right ventricular  size is normal.   3. Left atrial size was mildly dilated.   4. The mitral valve is abnormal. No evidence of mitral valve  regurgitation. No evidence of mitral stenosis.   5. The aortic valve is tricuspid. There is mild calcification of the  aortic valve. Aortic valve regurgitation is not visualized. Aortic valve  sclerosis is present, with no evidence of aortic valve stenosis.   6. Aortic dilatation noted. There is dilatation of the aortic root,  measuring 38 mm.   7. The inferior vena  cava is normal in size with greater than 50%  respiratory variability, suggesting right atrial pressure of 3 mmHg.  LHC 06/24/22  CONCLUSIONS: 100% occlusion of the proximal to mid LAD within the heavily calcified segment.  Right to left collaterals fill the LAD retrograde.  Thrombus is noted beyond the high-grade obstruction in the proximal vessel. Widely patent left main, circumflex, and right coronary Mild reduction in anteroapical contractility.  EF 45 to 50%.  EDP 5 mmHg.   RECOMMENDATIONS:   IV heparin rather than Xarelto until a final decision concerning treatment strategy is made. Start anti-ischemic therapy. Patient is loaded with Plavix. Discussed with Dr. Martinique.  Consider repeat attempt at PCI after several days to allow thrombus resolution versus medical therapy for 6 weeks returning to the CTO team for consideration of PCI that would likely include plaque modification with orbital atherectomy.  Diagnostic Dominance: Right    Recent Labs: 06/21/2022: B Natriuretic Peptide 294.6 06/23/2022: ALT 26 06/25/2022: Magnesium 2.0 09/19/2022: BUN 6; Creatinine, Ser 0.63; Hemoglobin 12.0; Platelets 211; Potassium 3.9; Sodium 137  Recent Lipid Panel    Component Value Date/Time   CHOL 170 06/25/2022 0514   CHOL 149 01/21/2022 0935   TRIG 305 (H) 06/25/2022 0514   HDL 25 (L) 06/25/2022 0514   HDL 37 (L) 01/21/2022 0935   CHOLHDL 6.8 06/25/2022 0514   VLDL 61 (H) 06/25/2022 0514   LDLCALC 84 06/25/2022 0514   LDLCALC 78 01/21/2022 0935   LDLDIRECT 114.0 02/13/2015 0750     Risk Assessment/Calculations:     CHA2DS2-VASc Score = 6  The patient's score is based upon: CHF History: 1 HTN History: 1 Diabetes History: 1 Stroke History: 2 Vascular Disease History: 0 Age Score: 0 Gender Score: 1      Physical Exam:    VS:  BP 118/82   Pulse 75   Ht 5' 5.5" (1.664 m)   Wt 233 lb (105.7 kg)   LMP 04/15/2015 (LMP Unknown) Comment: last in may  SpO2 97%   BMI 38.18  kg/m     Wt Readings from Last 3 Encounters:  10/03/22 233 lb (105.7 kg)  09/18/22 229 lb (103.9 kg)  09/09/22 231 lb 1.6 oz (104.8 kg)     GEN: Well developed, obese female in no acute distress HEENT: Normal NECK: No JVD; No carotid bruits CARDIAC: RRR, no murmurs, rubs, gallops RESPIRATORY:  Clear to auscultation without rales, wheezing or rhonchi  ABDOMEN: Soft, non-tender, non-distended MUSCULOSKELETAL:  No edema; No deformity. 2+ pedal pulses, equal bilaterally SKIN: Warm and dry. Right radial cath site well healed with minor bruising distal to wrist.  NEUROLOGIC:  Alert and oriented x 3 PSYCHIATRIC:  Normal affect   EKG:  EKG is ordered today.  The ekg ordered today demonstrates NSR at 78 bpm, LAFB, septal infarct, age undetermined, no acute change from previous tracing  Diagnoses:    1. Coronary artery disease involving native coronary artery of native heart without angina pectoris   2. Hyperlipidemia LDL goal <70   3. Persistent atrial fibrillation ([place])   4. Essential hypertension, benign   5. HFrEF (heart failure with reduced ejection fraction) ([place])   6. Chronic anticoagulation     Assessment and Plan:     CAD s/p atherectomy and DES to pLAD: LHC 06/24/2022 revealed 100% occlusion of proximal and mid LAD with right to left collaterals. Successful atherectomy and stenting of subtotal 99% proximal LAD stenosis with heavy calcification using orbital atherectomy with a CSI classic crown and DES placement on 10/19. Complicated by distal embolization with apical occlusion of LAD at completion of procedure. Overnight observation w/out  complications. Triple therapy x 1 month ASA/Plavix/Xarelto then stop aspirin and continue Plavix/Xarelto. She denies chest pain, dyspnea, or other symptoms concerning for angina. Continue GDMT including aspirin, atorvastatin, clopidogrel, Imdur, lisinopril, metoprolol. Rechecking lipid next week when fasting for LDL goal < 70.   Hypertension: BP  is well controlled.     Atrial fibrillation on chronic anticoagulation: History of ablation. No AAD. Currently maintaining NSR, rate well-controlled. Does not feel occasional PVCs noted on exam and on EKG.  No bleeding concerns presently. Continue metoprolol, Xarelto.  HFmrEF: LVEF 45 to 51%, normal diastolic parameters, mild LVH, normal RV, no significant valve disease on echo 06/23/2022. Is walking daily for exercise. Appears euvolemic on exam, however body habitus makes it difficult to assess. No dyspnea, orthopnea, edema, PND. Reports avoiding high sodium, processed foods. Continue lisinopril, metoprolol.  Hyperlipidemia LDL goal < 70: LDL 84 on 06/25/22.  Emphasized the importance of LDL less than 70.  We will recheck next week and when she can return to the office fasting.  Continue atorvastatin  Disposition: 3 months with Dr. Acie Fredrickson    Cardiac Rehabilitation Eligibility Assessment  The patient is ready to start cardiac rehabilitation from a cardiac standpoint.       Medication Adjustments/Labs and Tests Ordered: Current medicines are reviewed at length with the patient today.  Concerns regarding medicines are outlined above.  Orders Placed This Encounter  Procedures   Lipid Profile   Comp Met (CMET)   CBC   EKG 12-Lead   No orders of the defined types were placed in this encounter.   Patient Instructions  Medication Instructions:   Your physician recommends that you continue on your current medications as directed. Please refer to the Current Medication list given to you today.   *If you need a refill on your cardiac medications before your next appointment, please call your pharmacy*  Lab Work:  Your physician recommends that you return for a FASTING lipid profile/cbc/cmet. On Wednesday, November 8 You can come in on the day of your appointment anytime between 7:30-4:30 fasting from midnight the night before.   If you have labs (blood work) drawn today and your tests  are completely normal, you will receive your results only by: [place] (if you have MyChart) OR A paper copy in the mail If you have any lab test that is abnormal or we need to change your treatment, we will call you to review the results.   Testing/Procedures:  None ordered.   Follow-Up: At [facility], you and your health needs are our priority.  As part of our continuing mission to provide you with exceptional heart care, we have created designated Provider Care Teams.  These Care Teams include your primary Cardiologist (physician) and Advanced Practice Providers (APPs -  Physician Assistants and Nurse Practitioners) who all work together to provide you with the care you need, when you need it.  We recommend signing up for the patient portal called "MyChart".  Sign up information is provided on this After Visit Summary.  MyChart is used to connect with patients for Virtual Visits (Telemedicine).  Patients are able to view lab/test results, encounter notes, upcoming appointments, etc.  Non-urgent messages can be sent to your provider as well.   To learn more about what you can do with MyChart, go to [email or web address].    Your next appointment:   2 month(s)  The format for your next appointment:   In Person  Provider:   Mertie Moores, MD  Important Information About Sugar         Signed, Emmaline Life, NP  10/03/2022 9:18 AM    [place]

## 2022-10-03 ENCOUNTER — Ambulatory Visit (HOSPITAL_COMMUNITY): Payer: BC Managed Care – PPO

## 2022-10-03 ENCOUNTER — Ambulatory Visit: Payer: BC Managed Care – PPO | Attending: Nurse Practitioner | Admitting: Nurse Practitioner

## 2022-10-03 ENCOUNTER — Encounter: Payer: Self-pay | Admitting: Nurse Practitioner

## 2022-10-03 VITALS — BP 118/82 | HR 75 | Ht 65.5 in | Wt 233.0 lb

## 2022-10-03 DIAGNOSIS — I10 Essential (primary) hypertension: Secondary | ICD-10-CM | POA: Diagnosis not present

## 2022-10-03 DIAGNOSIS — E785 Hyperlipidemia, unspecified: Secondary | ICD-10-CM | POA: Diagnosis not present

## 2022-10-03 DIAGNOSIS — I5020 Unspecified systolic (congestive) heart failure: Secondary | ICD-10-CM

## 2022-10-03 DIAGNOSIS — Z7901 Long term (current) use of anticoagulants: Secondary | ICD-10-CM

## 2022-10-03 DIAGNOSIS — I4819 Other persistent atrial fibrillation: Secondary | ICD-10-CM | POA: Diagnosis not present

## 2022-10-03 DIAGNOSIS — I2510 Atherosclerotic heart disease of native coronary artery without angina pectoris: Secondary | ICD-10-CM

## 2022-10-03 NOTE — Patient Instructions (Signed)
Medication Instructions:   Your physician recommends that you continue on your current medications as directed. Please refer to the Current Medication list given to you today.   *If you need a refill on your cardiac medications before your next appointment, please call your pharmacy*  Lab Work:  Your physician recommends that you return for a FASTING lipid profile/cbc/cmet. On Wednesday, November 8 You can come in on the day of your appointment anytime between 7:30-4:30 fasting from midnight the night before.   If you have labs (blood work) drawn today and your tests are completely normal, you will receive your results only by: [place] (if you have MyChart) OR A paper copy in the mail If you have any lab test that is abnormal or we need to change your treatment, we will call you to review the results.   Testing/Procedures:  None ordered.   Follow-Up: At [facility], you and your health needs are our priority.  As part of our continuing mission to provide you with exceptional heart care, we have created designated Provider Care Teams.  These Care Teams include your primary Cardiologist (physician) and Advanced Practice Providers (APPs -  Physician Assistants and Nurse Practitioners) who all work together to provide you with the care you need, when you need it.  We recommend signing up for the patient portal called "MyChart".  Sign up information is provided on this After Visit Summary.  MyChart is used to connect with patients for Virtual Visits (Telemedicine).  Patients are able to view lab/test results, encounter notes, upcoming appointments, etc.  Non-urgent messages can be sent to your provider as well.   To learn more about what you can do with MyChart, go to [email or web address].    Your next appointment:   2 month(s)  The format for your next appointment:   In Person  Provider:   Mertie Moores, MD     Important Information About Sugar

## 2022-10-06 ENCOUNTER — Ambulatory Visit (HOSPITAL_COMMUNITY): Payer: BC Managed Care – PPO

## 2022-10-07 ENCOUNTER — Other Ambulatory Visit (HOSPITAL_COMMUNITY): Payer: Self-pay

## 2022-10-07 ENCOUNTER — Telehealth: Payer: Self-pay

## 2022-10-07 NOTE — Telephone Encounter (Signed)
Noted  

## 2022-10-07 NOTE — Telephone Encounter (Signed)
Received notification from [facility] regarding a prior authorization for Ozempic (2mg /dose) 8mg /64ml pen.  Authorization has been APPROVED from 10/07/2022 to 10/06/2023.   Per test claim, copay for 84 days supply is $0 per [place] at [place]. She stated the medicine is on a backorder.  Key: [id number]

## 2022-10-08 ENCOUNTER — Other Ambulatory Visit: Payer: BC Managed Care – PPO

## 2022-10-08 ENCOUNTER — Ambulatory Visit (HOSPITAL_COMMUNITY): Payer: BC Managed Care – PPO

## 2022-10-09 ENCOUNTER — Ambulatory Visit: Payer: BC Managed Care – PPO | Attending: Nurse Practitioner

## 2022-10-09 DIAGNOSIS — E785 Hyperlipidemia, unspecified: Secondary | ICD-10-CM

## 2022-10-09 DIAGNOSIS — I5020 Unspecified systolic (congestive) heart failure: Secondary | ICD-10-CM

## 2022-10-09 DIAGNOSIS — I4819 Other persistent atrial fibrillation: Secondary | ICD-10-CM

## 2022-10-09 DIAGNOSIS — I10 Essential (primary) hypertension: Secondary | ICD-10-CM

## 2022-10-09 DIAGNOSIS — I2510 Atherosclerotic heart disease of native coronary artery without angina pectoris: Secondary | ICD-10-CM

## 2022-10-10 ENCOUNTER — Ambulatory Visit (HOSPITAL_COMMUNITY): Payer: BC Managed Care – PPO

## 2022-10-10 LAB — LIPID PANEL
Chol/HDL Ratio: 3.4 ratio (ref 0.0–4.4)
Cholesterol, Total: 122 mg/dL (ref 100–199)
HDL: 36 mg/dL — ABNORMAL LOW (ref >39)
LDL Chol Calc (NIH): 65 mg/dL (ref 0–99)
Triglycerides: 118 mg/dL (ref 0–149)
VLDL Cholesterol Cal: 21 mg/dL (ref 5–40)

## 2022-10-10 LAB — CBC
Hematocrit: 38.9 % (ref 34.0–46.6)
Hemoglobin: 12.8 g/dL (ref 11.1–15.9)
MCH: 32 pg (ref 26.6–33.0)
MCHC: 32.9 g/dL (ref 31.5–35.7)
MCV: 97 fL (ref 79–97)
Platelets: 265 10*3/uL (ref 150–450)
RBC: 4 x10E6/uL (ref 3.77–5.28)
RDW: 12.5 % (ref 11.7–15.4)
WBC: 6.5 10*3/uL (ref 3.4–10.8)

## 2022-10-10 LAB — COMPREHENSIVE METABOLIC PANEL
ALT: 21 IU/L (ref 0–32)
AST: 11 IU/L (ref 0–40)
Albumin/Globulin Ratio: 1.4 (ref 1.2–2.2)
Albumin: 3.9 g/dL (ref 3.8–4.9)
Alkaline Phosphatase: 76 IU/L (ref 44–121)
BUN/Creatinine Ratio: 14 (ref 9–23)
BUN: 10 mg/dL (ref 6–24)
Bilirubin Total: 0.4 mg/dL (ref 0.0–1.2)
CO2: 25 mmol/L (ref 20–29)
Calcium: 9.1 mg/dL (ref 8.7–10.2)
Chloride: 102 mmol/L (ref 96–106)
Creatinine, Ser: 0.69 mg/dL (ref 0.57–1.00)
Globulin, Total: 2.7 g/dL (ref 1.5–4.5)
Glucose: 144 mg/dL — ABNORMAL HIGH (ref 70–99)
Potassium: 4.6 mmol/L (ref 3.5–5.2)
Sodium: 140 mmol/L (ref 134–144)
Total Protein: 6.6 g/dL (ref 6.0–8.5)
eGFR: 100 mL/min/{1.73_m2} (ref >59)

## 2022-10-13 ENCOUNTER — Ambulatory Visit (HOSPITAL_COMMUNITY): Payer: BC Managed Care – PPO

## 2022-10-15 ENCOUNTER — Ambulatory Visit (HOSPITAL_COMMUNITY): Payer: BC Managed Care – PPO

## 2022-10-15 ENCOUNTER — Ambulatory Visit (INDEPENDENT_AMBULATORY_CARE_PROVIDER_SITE_OTHER): Payer: Self-pay | Admitting: "Endocrinology

## 2022-10-15 ENCOUNTER — Encounter: Payer: Self-pay | Admitting: "Endocrinology

## 2022-10-15 VITALS — BP 136/86 | HR 60 | Ht 65.5 in | Wt 234.4 lb

## 2022-10-15 DIAGNOSIS — E1159 Type 2 diabetes mellitus with other circulatory complications: Secondary | ICD-10-CM

## 2022-10-15 DIAGNOSIS — Z6837 Body mass index (BMI) 37.0-37.9, adult: Secondary | ICD-10-CM

## 2022-10-15 DIAGNOSIS — E559 Vitamin D deficiency, unspecified: Secondary | ICD-10-CM

## 2022-10-15 DIAGNOSIS — E782 Mixed hyperlipidemia: Secondary | ICD-10-CM

## 2022-10-15 DIAGNOSIS — I10 Essential (primary) hypertension: Secondary | ICD-10-CM

## 2022-10-15 NOTE — Patient Instructions (Signed)
                                     Advice for Weight Management  -For most of us the best way to lose weight is by diet management. Generally speaking, diet management means consuming less calories intentionally which over time brings about progressive weight loss.  This can be achieved more effectively by avoiding ultra processed carbohydrates, processed meats, unhealthy fats.    It is critically important to know your numbers: how much calorie you are consuming and how much calorie you need. More importantly, our carbohydrates sources should be unprocessed naturally occurring  complex starch food items.  It is always important to balance nutrition also by  appropriate intake of proteins (mainly plant-based), healthy fats/oils, plenty of fruits and vegetables.   -The American College of Lifestyle Medicine (ACL M) recommends nutrition derived mostly from Whole Food, Plant Predominant Sources example an apple instead of applesauce or apple pie. Eat Plenty of vegetables, Mushrooms, fruits, Legumes, Whole Grains, Nuts, seeds in lieu of processed meats, processed snacks/pastries red meat, poultry, eggs.  Use only water or unsweetened tea for hydration.  The College also recommends the need to stay away from risky substances including alcohol, smoking; obtaining 7-9 hours of restorative sleep, at least 150 minutes of moderate intensity exercise weekly, importance of healthy social connections, and being mindful of stress and seek help when it is overwhelming.    -Sticking to a routine mealtime to eat 3 meals a day and avoiding unnecessary snacks is shown to have a big role in weight control. Under normal circumstances, the only time we burn stored energy is when we are hungry, so allow  some hunger to take place- hunger means no food between appropriate meal times, only water.  It is not advisable to starve.   -It is better to avoid simple carbohydrates including:  Cakes, Sweet Desserts, Ice Cream, Soda (diet and regular), Sweet Tea, Candies, Chips, Cookies, Store Bought Juices, Alcohol in Excess of  1-2 drinks a day, Lemonade,  Artificial Sweeteners, Doughnuts, Coffee Creamers, "Sugar-free" Products, etc, etc.  This is not a complete list.....    -Consulting with certified diabetes educators is proven to provide you with the most accurate and current information on diet.  Also, you may be  interested in discussing diet options/exchanges , we can schedule a visit with Kelly Lang, RDN, CDE for individualized nutrition education.  -Exercise: If you are able: 30 -60 minutes a day ,4 days a week, or 150 minutes of moderate intensity exercise weekly.    The longer the better if tolerated.  Combine stretch, strength, and aerobic activities.  If you were told in the past that you have high risk for cardiovascular diseases, or if you are currently symptomatic, you may seek evaluation by your heart doctor prior to initiating moderate to intense exercise programs.                                  Additional Care Considerations for Diabetes/Prediabetes   -Diabetes  is a chronic disease.  The most important care consideration is regular follow-up with your diabetes care provider with the goal being avoiding or delaying its complications and to take advantage of advances in medications and technology.  If appropriate actions are taken early enough, type 2 diabetes can even be   reversed.  Seek information from the right source.  - Whole Food, Plant Predominant Nutrition is highly recommended: Eat Plenty of vegetables, Mushrooms, fruits, Legumes, Whole Grains, Nuts, seeds in lieu of processed meats, processed snacks/pastries red meat, poultry, eggs as recommended by American College of  Lifestyle Medicine (ACLM).  -Type 2 diabetes is known to coexist with other important comorbidities such as high blood pressure and high cholesterol.  It is critical to control not only the  diabetes but also the high blood pressure and high cholesterol to minimize and delay the risk of complications including coronary artery disease, stroke, amputations, blindness, etc.  The good news is that this diet recommendation for type 2 diabetes is also very helpful for managing high cholesterol and high blood blood pressure.  - Studies showed that people with diabetes will benefit from a class of medications known as ACE inhibitors and statins.  Unless there are specific reasons not to be on these medications, the standard of care is to consider getting one from these groups of medications at an optimal doses.  These medications are generally considered safe and proven to help protect the heart and the kidneys.    - People with diabetes are encouraged to initiate and maintain regular follow-up with eye doctors, foot doctors, dentists , and if necessary heart and kidney doctors.     - It is highly recommended that people with diabetes quit smoking or stay away from smoking, and get yearly  flu vaccine and pneumonia vaccine at least every 5 years.  See above for additional recommendations on exercise, sleep, stress management , and healthy social connections.      

## 2022-10-15 NOTE — Progress Notes (Signed)
10/15/2022, 12:22 PM  Endocrinology follow-up note   Subjective:    Patient ID: Kelly Lang, female    DOB: [birth year].  Kelly Lang is being seen in follow-up after she was seen in consultation for management of currently uncontrolled symptomatic diabetes requested by  Kelly Matar, MD.   Past Medical History:  Diagnosis Date   Arthritis    "all over" (06/30/2017)   Chicken pox    Dyslipidemia    Dysrhythmia ablation 2018   a-fib   GERD (gastroesophageal reflux disease)    H/O: hypothyroidism    a. thyroid biopsy 1996 with synthroid. Stopped taking rx and was loss to follow up b. normal thyroid function 10/20/13   High cholesterol    Hx of cardiovascular stress test    ETT/Lexiscan Myoview (12/2013):  No ischemia; not gated; low risk.   Hypertension    Obesity    Persistent atrial fibrillation (HCC)    a diagnosed 10/25/2013   Psoriasis    on Skyrizi   Seasonal allergies    Stroke [facility]) 2016   "some speech problems since" (06/30/2017)   Tachycardia induced cardiomyopathy (HCC)    a. 2/2 Afib with RVR for unknown duration (EF: 40-45%)   Thyroid disease    Type II diabetes mellitus (HCC)    a. hg A1c 10, newly diagnosed (10/26/13)    Past Surgical History:  Procedure Laterality Date   ATRIAL FIBRILLATION ABLATION  06/30/2017   ATRIAL FIBRILLATION ABLATION N/A 06/30/2017   Procedure: Atrial Fibrillation Ablation;  Surgeon: Hillis Range, MD;  [facility];  Service: Cardiovascular;  Laterality: N/A;   BICEPT TENODESIS Right 05/24/2020   Procedure: BICEPS TENODESIS;  Surgeon: Cammy Copa, MD;  Location: [facility];  Service: Orthopedics;  Laterality: Right;   BIOPSY THYROID  1996   CARDIOVERSION N/A 12/07/2013   Procedure: CARDIOVERSION;  Surgeon: Everette Rank, MD;  Location: [facility] ENDOSCOPY;  Service: Cardiovascular;  Laterality: N/A;    CARDIOVERSION N/A 03/10/2016   Procedure: CARDIOVERSION;  Surgeon: Vesta Mixer, MD;  Location: [facility] ENDOSCOPY;  Service: Cardiovascular;  Laterality: N/A;   CORONARY ATHERECTOMY N/A 09/18/2022   Procedure: CORONARY ATHERECTOMY;  Surgeon: Tonny Bollman, MD;  Location: [facility] INVASIVE CV LAB;  Service: Cardiovascular;  Laterality: N/A;   CORONARY BALLOON ANGIOPLASTY N/A 06/24/2022   Procedure: CORONARY BALLOON ANGIOPLASTY;  Surgeon: Lyn Records, MD;  [facility]: [facility];  Service: Cardiovascular;  Laterality: N/A;   CORONARY STENT INTERVENTION N/A 09/18/2022   Procedure: CORONARY STENT INTERVENTION;  Surgeon: Tonny Bollman, MD;  Location: [facility] INVASIVE CV LAB;  Service: Cardiovascular;  Laterality: N/A;   ESOPHAGOGASTRODUODENOSCOPY N/A 12/07/2017   Procedure: ESOPHAGOGASTRODUODENOSCOPY (EGD);  Surgeon: Hilarie Fredrickson, MD;  Location: [facility] ENDOSCOPY;  Service: Endoscopy;  Laterality: N/A;   EYE MUSCLE SURGERY Right ~ 1966   LEFT HEART CATH AND CORONARY ANGIOGRAPHY N/A 06/24/2022   Procedure: LEFT HEART CATH AND CORONARY ANGIOGRAPHY;  Surgeon: Lyn Records, MD;  [facility];  Service: Cardiovascular;  Laterality: N/A;   SHOULDER ARTHROSCOPY WITH ROTATOR CUFF REPAIR Right 05/24/2020   Procedure: right  shoulder arthroscopy, rotator cuff tear repair biceps tenodesis;  Surgeon: Cammy Copa, MD;  Location: [facility];  Service: Orthopedics;  Laterality: Right;   TEE WITHOUT CARDIOVERSION N/A 06/29/2017   Procedure: TRANSESOPHAGEAL ECHOCARDIOGRAM (TEE);  Surgeon: Pricilla Riffle, MD;  Location: [facility] ENDOSCOPY;  Service: Cardiovascular;  Laterality: N/A;   TONSILLECTOMY  1971    Social History   Socioeconomic History   Marital status: Divorced    Spouse name: Not on file   Number of children: 0   Years of education: 14   Highest education level: Not on file  Occupational History   Occupation: Designer, television/film set  Tobacco Use   Smoking status: Former     Packs/day: 1.00    Years: 22.00    Total pack years: 22.00    Types: Cigarettes    Quit date: 12/01/2010    Years since quitting: 11.8   Smokeless tobacco: Never  Vaping Use   Vaping Use: Never used  Substance and Sexual Activity   Alcohol use: Yes    Comment: social   Drug use: No   Sexual activity: Not Currently  Other Topics Concern   Not on file  Social History Narrative   Moved to [place] from [place] in 2002.     Fun: shop, sew, decorate   Denies any beliefs effecting healthcare   Social Determinants of Health   Financial Resource Strain: Not on file  Food Insecurity: No Food Insecurity (09/19/2022)   Hunger Vital Sign    Worried About Running Out of Food in the Last Year: Never true    Ran Out of Food in the Last Year: Never true  Transportation Needs: No Transportation Needs (09/19/2022)   PRAPARE - Administrator, Civil Service (Medical): No    Lack of Transportation (Non-Medical): No  Physical Activity: Not on file  Stress: Not on file  Social Connections: Not on file    Family History  Adopted: Yes  Problem Relation Age of Onset   Hypertension Mother    Hyperlipidemia Mother     Outpatient Encounter Medications as of 10/15/2022  Medication Sig   acetaminophen (TYLENOL) 500 MG tablet Take 1,000 mg by mouth every 6 (six) hours as needed for headache.   aspirin 81 MG chewable tablet Chew 1 tablet (81 mg total) by mouth daily. Stop taking after 30 days.   atorvastatin (LIPITOR) 80 MG tablet Take 1 tablet (80 mg total) by mouth daily.   clopidogrel (PLAVIX) 75 MG tablet Take 1 tablet (75 mg total) by mouth daily with breakfast.   Continuous Blood Gluc Receiver (DEXCOM G7 RECEIVER) DEVI Use to monitor BG continuously   Continuous Blood Gluc Sensor (DEXCOM G7 SENSOR) MISC CHANGE SENSOR EVERY 10 DAYS   diclofenac Sodium (VOLTAREN) 1 % GEL Apply 2 g topically 4 (four) times daily. (Patient taking differently: Apply 2 g topically daily as needed  (pain).)   DULoxetine (CYMBALTA) 30 MG capsule Take 1 capsule (30 mg total) by mouth daily.   glipiZIDE (GLUCOTROL XL) 5 MG 24 hr tablet Take 1 tablet by mouth once daily with breakfast   isosorbide mononitrate (IMDUR) 30 MG 24 hr tablet Take 1 tablet (30 mg total) by mouth daily.   lisinopril (ZESTRIL) 10 MG tablet Take 1 tablet (10 mg total) by mouth daily.   metFORMIN (GLUCOPHAGE) 1000 MG tablet Take 1 tablet (1,000 mg total) by mouth 2 (two) times daily with a meal.   metoCLOPramide (REGLAN) 10 MG tablet Take  1 tablet (10 mg total) by mouth every 6 (six) hours. (Patient taking differently: Take 10 mg by mouth every 6 (six) hours as needed (gastroparesis).)   metoprolol succinate (TOPROL-XL) 25 MG 24 hr tablet Take 3 tablets (75 mg total) by mouth daily.   ONETOUCH DELICA LANCETS 33G MISC Use as instructed to check blood sugar up to 3 times daily.   ONETOUCH VERIO test strip USE STRIPS TO CHECK GLUCOSE UP TO THREE TIMES DAILY AS DIRECTED   OZEMPIC, 2 MG/DOSE, 8 MG/3ML SOPN INJECT 2MG  SUBCUTANEOUSLY ONCE A WEEK   pantoprazole (PROTONIX) 40 MG tablet Take 1 tablet (40 mg total) by mouth daily. (Patient not taking: Reported on 10/15/2022)   rivaroxaban (XARELTO) 20 MG TABS tablet Take 1 tablet (20 mg total) by mouth daily with supper.   SEMGLEE, YFGN, 100 UNIT/ML Pen INJECT 80 UNITS SUBCUTANEOUSLY  AT BEDTIME   SKYRIZI PEN 150 MG/ML SOAJ Inject 150 mg into the skin every 3 (three) months.   TRUEPLUS PEN NEEDLES 32G X 4 MM MISC USE AS DIRECTED AT BEDTIME.   No facility-administered encounter medications on file as of 10/15/2022.    ALLERGIES: Allergies  Allergen Reactions   Jardiance [Empagliflozin] Other (See Comments)    Yeast infections   Codeine Itching and Rash    VACCINATION STATUS: Immunization History  Administered Date(s) Administered   Influenza,inj,Quad PF,6+ Mos 11/19/2016, 09/07/2017, 08/09/2018, 08/16/2020, 08/23/2021, 09/08/2022   Moderna Sars-Covid-2 Vaccination  01/04/2020, 02/01/2020, 10/12/2020, 06/09/2021   Pneumococcal Polysaccharide-23 06/16/2016   Tdap 06/16/2016    Diabetes She presents for her follow-up diabetic visit. She has type 2 diabetes mellitus. Onset time: She was diagnosed at approximate age of 50 years. Her disease course has been improving. There are no hypoglycemic associated symptoms. Pertinent negatives for hypoglycemia include no confusion, headaches, pallor or seizures. Pertinent negatives for diabetes include no chest pain, no fatigue, no polydipsia, no polyphagia and no polyuria. There are no hypoglycemic complications. Symptoms are improving. Diabetic complications include a CVA and heart disease. Risk factors for coronary artery disease include dyslipidemia, diabetes mellitus, obesity, hypertension, post-menopausal, sedentary lifestyle and tobacco exposure. Current diabetic treatment includes insulin injections (She is currently on Semglee 80 units nightly, Ozempic 2 mg weekly.  Glipizide 5 mg daily.  Metformin 1000 mg p.o. twice daily.). Her weight is fluctuating minimally. She is following a generally unhealthy diet. When asked about meal planning, she reported none. She has had a previous visit with a dietitian. She participates in exercise intermittently. Her home blood glucose trend is decreasing steadily. Her breakfast blood glucose range is generally 140-180 mg/dl. Her lunch blood glucose range is generally 140-180 mg/dl. Her dinner blood glucose range is generally 140-180 mg/dl. Her bedtime blood glucose range is generally 140-180 mg/dl. Her overall blood glucose range is 140-180 mg/dl. 06/18/2016 presents with controlled glycemic profile to near target levels.  Her Dexcom CGM was analyzed showing 66% time range, 30% level 1 hyperglycemia, 4% level 2 hyperglycemia.  She has no high glycemia.  Her point-of-care A1c is 7.4% improving from 9.1%.    ) An ACE inhibitor/angiotensin II receptor blocker is being taken. Eye exam is current.   Hypertension This is a chronic problem. The current episode started more than 1 year ago. The problem is controlled. Pertinent negatives include no chest pain, headaches, palpitations or shortness of breath. Risk factors for coronary artery disease include diabetes mellitus, dyslipidemia, obesity, sedentary lifestyle, smoking/tobacco exposure and post-menopausal state. Past treatments include ACE inhibitors and beta blockers. Hypertensive end-organ  damage includes CVA.  Hyperlipidemia This is a chronic problem. The current episode started more than 1 year ago. The problem is uncontrolled. Exacerbating diseases include diabetes and obesity. Pertinent negatives include no chest pain, myalgias or shortness of breath. Risk factors for coronary artery disease include dyslipidemia, diabetes mellitus, hypertension, obesity, a sedentary lifestyle and post-menopausal.     Review of Systems  Constitutional:  Negative for chills, fatigue, fever and unexpected weight change.  HENT:  Negative for trouble swallowing and voice change.   Eyes:  Negative for visual disturbance.  Respiratory:  Negative for cough, shortness of breath and wheezing.   Cardiovascular:  Negative for chest pain, palpitations and leg swelling.  Gastrointestinal:  Negative for diarrhea, nausea and vomiting.  Endocrine: Negative for cold intolerance, heat intolerance, polydipsia, polyphagia and polyuria.  Musculoskeletal:  Negative for arthralgias and myalgias.  Skin:  Negative for color change, pallor, rash and wound.  Neurological:  Negative for seizures and headaches.  Psychiatric/Behavioral:  Negative for confusion and suicidal ideas.     Objective:       10/15/2022    9:14 AM 10/03/2022    8:23 AM 09/19/2022    7:33 AM  Vitals with BMI  Height 5' 5.5" 5' 5.5"   Weight 234 lbs 6 oz 233 lbs   BMI 38.4 38.17   Systolic 136 118 903  Diastolic 86 82 80  Pulse 60 75 66    BP 136/86   Pulse 60   Ht 5' 5.5" (1.664 m)   Wt  234 lb 6.4 oz (106.3 kg)   LMP 04/15/2015 (LMP Unknown) Comment: last in may  BMI 38.41 kg/m   Wt Readings from Last 3 Encounters:  10/15/22 234 lb 6.4 oz (106.3 kg)  10/03/22 233 lb (105.7 kg)  09/18/22 229 lb (103.9 kg)     CMP     Component Value Date/Time   NA 140 10/09/2022 0829   K 4.6 10/09/2022 0829   CL 102 10/09/2022 0829   CO2 25 10/09/2022 0829   GLUCOSE 144 (H) 10/09/2022 0829   GLUCOSE 111 (H) 09/19/2022 0159   BUN 10 10/09/2022 0829   CREATININE 0.69 10/09/2022 0829   CREATININE 0.90 06/16/2016 1128   CALCIUM 9.1 10/09/2022 0829   PROT 6.6 10/09/2022 0829   ALBUMIN 3.9 10/09/2022 0829   AST 11 10/09/2022 0829   ALT 21 10/09/2022 0829   ALKPHOS 76 10/09/2022 0829   BILITOT 0.4 10/09/2022 0829   GFRNONAA >60 09/19/2022 0159   GFRNONAA 73 06/16/2016 1128   GFRAA 118 12/25/2020 0802   GFRAA 84 06/16/2016 1128     Diabetic Labs (most recent): Lab Results  Component Value Date   HGBA1C 7.4 (H) 09/18/2022   HGBA1C 7.4 (A) 05/08/2022   HGBA1C 9.1 (A) 01/22/2022   MICROALBUR 150 09/24/2020   MICROALBUR 6.2 12/26/2015   MICROALBUR 2.9 (H) 11/13/2014     Lipid Panel ( most recent) Lipid Panel     Component Value Date/Time   CHOL 122 10/09/2022 0829   TRIG 118 10/09/2022 0829   HDL 36 (L) 10/09/2022 0829   CHOLHDL 3.4 10/09/2022 0829   CHOLHDL 6.8 06/25/2022 0514   VLDL 61 (H) 06/25/2022 0514   LDLCALC 65 10/09/2022 0829   LDLDIRECT 114.0 02/13/2015 0750   LABVLDL 21 10/09/2022 0829      Lab Results  Component Value Date   TSH 0.597 12/25/2020   TSH 1.01 09/16/2019   TSH 0.718 12/26/2015   TSH 0.950 09/15/2015  TSH 0.95 11/13/2014   TSH 1.020 10/25/2013   FREET4 0.99 12/25/2020   FREET4 0.98 12/26/2015   FREET4 0.98 10/25/2013      Assessment & Plan:   1. DM type 2 causing vascular disease (HCC) - Angelik Walls has currently uncontrolled symptomatic type 2 DM since  61 years of age.  Holiday presents with controlled glycemic profile  to near target levels.  Her Dexcom CGM was analyzed showing 66% time range, 30% level 1 hyperglycemia, 4% level 2 hyperglycemia.  She has no high glycemia.  Her point-of-care A1c is 7.4% improving from 9.1%.     - I had a long discussion with her about the progressive nature of diabetes and the pathology behind its complications. -her diabetes is complicated by CVA, coronary artery disease now status post stent placement, peripheral neuropathy and she remains at a high risk for more acute and chronic complications which include CAD, CVA, CKD, retinopathy, and neuropathy. These are all discussed in detail with her.  - I have counseled her on diet  and weight management  by adopting a carbohydrate restricted/protein rich diet. Patient is encouraged to switch to  unprocessed or minimally processed     complex starch and increased protein intake (animal or plant source), fruits, and vegetables. -  she is advised to stick to a routine mealtimes to eat 3 meals  a day and avoid unnecessary snacks ( to snack only to correct hypoglycemia).   - she acknowledges that there is a room for improvement in her food and drink choices. - Suggestion is made for her to avoid simple carbohydrates  from her diet including Cakes, Sweet Desserts, Ice Cream, Soda (diet and regular), Sweet Tea, Candies, Chips, Cookies, Store Bought Juices, Alcohol , Artificial Sweeteners,  Coffee Creamer, and "Sugar-free" Products, Lemonade. This will help patient to have more stable blood glucose profile and potentially avoid unintended weight gain.  The following Lifestyle Medicine recommendations according to American College of Lifestyle Medicine  [facility]) were discussed and and offered to patient and she  agrees to start the journey:  A. Whole Foods, Plant-Based Nutrition comprising of fruits and vegetables, plant-based proteins, whole-grain carbohydrates was discussed in detail with the patient.   A list for source of those nutrients were  also provided to the patient.  Patient will use only water or unsweetened tea for hydration. B.  The need to stay away from risky substances including alcohol, smoking; obtaining 7 to 9 hours of restorative sleep, at least 150 minutes of moderate intensity exercise weekly, the importance of healthy social connections,  and stress management techniques were discussed. C.  A full color page of  Calorie density of various food groups per pound showing examples of each food groups was provided to the patient.   - she reports that she has had enough encounters with dietitian in the past.    - I have approached her with the following individualized plan to manage  her diabetes and patient agrees:   -She has not engaged in lifestyle medicine optimally.  Liver, in light of her presentation with improving glycemic profile, she will not need prandial insulin for now.    -She is approached to continue monitoring blood glucose continuously using her CGM.  She is advised to continue Semglee 80 units nightly, metformin 1000 mg p.o. twice daily, glipizide 5 mg XL p.o. daily at breakfast.  She is also advised to continue Ozempic 2 mg subcutaneously weekly.   - she is encouraged to  call clinic for blood glucose levels less than 70 or above 200 mg /dl.   - Specific targets for  A1c;  LDL, HDL,  and Triglycerides were discussed with the patient.  2) Blood Pressure /Hypertension:  Her blood pressure is controlled to target.  She has a chance to minimize her blood pressure medications on subsequent visits.  The above recommended plant dominant whole food approach will address hypertension, hyperlipidemia as well.   she is advised to continue her current medications including lisinopril 10 mg p.o. daily, metoprolol 25 mg p.o. twice daily.   3) Lipids/Hyperlipidemia:   Review of her recent lipid panel showed improved LDL at 55.  She is advised to continue atorvastatin 40 mg p.o. nightly.  Her LDL has improved from  114.   Side effects and precautions discussed with her.     4)  Weight/Diet:  Body mass index is 38.41 kg/m.  -   clearly complicating her diabetes care.   she is  a candidate for weight loss. I discussed with her the fact that loss of 5 - 10% of her  current body weight will have the most impact on her diabetes management.  Exercise, and detailed carbohydrates information provided  -  detailed on discharge instructions.   5) Chronic Care/Health Maintenance:  -she  is on ACEI/ARB and Statin medications and  is encouraged to initiate and continue to follow up with Ophthalmology, Dentist,  Podiatrist at least yearly or according to recommendations, and advised to   stay away from smoking. I have recommended yearly flu vaccine and pneumonia vaccine at least every 5 years; moderate intensity exercise for up to 150 minutes weekly; and  sleep for at least 7 hours a day.  Her screening ABI was negative for PAD in January 2022, the study will be repeated in January 2027, or sooner if needed.  - she is  advised to maintain close follow up with Kelly Matar, MD for primary care needs, as well as her other providers for optimal and coordinated care.    I spent 41 minutes in the care of the patient today including review of labs from CMP, Lipids, Thyroid Function, Hematology (current and previous including abstractions from other facilities); face-to-face time discussing  her blood glucose readings/logs, discussing hypoglycemia and hyperglycemia episodes and symptoms, medications doses, her options of short and long term treatment based on the latest standards of care / guidelines;  discussion about incorporating lifestyle medicine;  and documenting the encounter. Risk reduction counseling performed per USPSTF guidelines to reduce obesity and cardiovascular risk factors.     Please refer to Patient Instructions for Blood Glucose Monitoring and Insulin/Medications Dosing Guide"  in media tab for  additional information. Please  also refer to " Patient Self Inventory" in the Media  tab for reviewed elements of pertinent patient history.  Kelly Lang participated in the discussions, expressed understanding, and voiced agreement with the above plans.  All questions were answered to her satisfaction. she is encouraged to contact clinic should she have any questions or concerns prior to her return visit.     Follow up plan: - Return in about 3 months (around 01/15/2023) for Bring Meter/CGM Device/Logs- A1c in Office.  Marquis Lunch, MD [facility] [address] [place], [place] [address] Phone: [phone]  Fax: [phone]    10/15/2022, 12:22 PM  This note was partially dictated with voice recognition software. Similar sounding words can be transcribed inadequately or may not  be corrected upon review.

## 2022-11-11 ENCOUNTER — Ambulatory Visit: Payer: BC Managed Care – PPO | Admitting: Cardiovascular Disease

## 2022-11-17 ENCOUNTER — Telehealth (HOSPITAL_COMMUNITY): Payer: Self-pay

## 2022-11-17 NOTE — Telephone Encounter (Signed)
Pt is not interested in the cardiac rehab program. Closed referral 

## 2022-12-05 ENCOUNTER — Other Ambulatory Visit: Payer: Self-pay | Admitting: Nurse Practitioner

## 2022-12-05 DIAGNOSIS — E1159 Type 2 diabetes mellitus with other circulatory complications: Secondary | ICD-10-CM

## 2022-12-08 ENCOUNTER — Other Ambulatory Visit: Payer: Self-pay | Admitting: Internal Medicine

## 2022-12-08 ENCOUNTER — Other Ambulatory Visit: Payer: Self-pay | Admitting: Nurse Practitioner

## 2022-12-08 DIAGNOSIS — E1159 Type 2 diabetes mellitus with other circulatory complications: Secondary | ICD-10-CM

## 2022-12-08 DIAGNOSIS — M2550 Pain in unspecified joint: Secondary | ICD-10-CM

## 2022-12-09 NOTE — Telephone Encounter (Signed)
Requested Prescriptions  Pending Prescriptions Disp Refills   DULoxetine (CYMBALTA) 30 MG capsule [Pharmacy Med Name: DULoxetine HCl 30 MG Oral Capsule Delayed Release Particles] 90 capsule 0    Sig: Take 1 capsule by mouth once daily     Psychiatry: Antidepressants - SNRI - duloxetine Passed - 12/08/2022  7:49 AM      Passed - Cr in normal range and within 360 days    Creat  Date Value Ref Range Status  06/16/2016 0.90 0.50 - 1.05 mg/dL Final    Comment:      For patients > or = 60 years of age: The upper reference limit for Creatinine is approximately 13% higher for people identified as African-American.      Creatinine, Ser  Date Value Ref Range Status  10/09/2022 0.69 0.57 - 1.00 mg/dL Final   Creatinine, POC  Date Value Ref Range Status  09/24/2020 300 mg/dL Final         Passed - eGFR is 30 or above and within 360 days    GFR, Est African American  Date Value Ref Range Status  06/16/2016 84 >=60 mL/min Final   GFR calc Af Amer  Date Value Ref Range Status  12/25/2020 118 >59 mL/min/1.73 Final    Comment:    **In accordance with recommendations from the NKF-ASN Task force,**   [facility] is in the process of updating its eGFR calculation to the   2021 CKD-EPI creatinine equation that estimates kidney function   without a race variable.    GFR, Est Non African American  Date Value Ref Range Status  06/16/2016 73 >=60 mL/min Final   GFR, Estimated  Date Value Ref Range Status  09/19/2022 >60 >60 mL/min Final    Comment:    (NOTE) Calculated using the CKD-EPI Creatinine Equation (2021)    GFR  Date Value Ref Range Status  02/13/2015 107.47 >60.00 mL/min Final   eGFR  Date Value Ref Range Status  10/09/2022 100 >59 mL/min/1.73 Final         Passed - Completed PHQ-2 or PHQ-9 in the last 360 days      Passed - Last BP in normal range    BP Readings from Last 1 Encounters:  10/15/22 136/86         Passed - Valid encounter within last 6 months    Recent  Outpatient Visits           3 months ago 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([place])   [place], MD   7 months ago 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[facility])   [place], Levada Dy M, [place]   9 months ago [place], [place] W, NP   1 year ago 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[facility])   [place], Deborah B, MD   1 year ago 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[facility])   [place], MD       Future Appointments             In 1 week Nahser, Wonda Cheng, MD [place] [facility] [place]. [facility]   In 1 month Wynetta Emery, Dalbert Batman, MD [place]

## 2022-12-11 ENCOUNTER — Other Ambulatory Visit: Payer: Self-pay | Admitting: "Endocrinology

## 2022-12-17 ENCOUNTER — Other Ambulatory Visit: Payer: Self-pay | Admitting: "Endocrinology

## 2022-12-17 DIAGNOSIS — E1142 Type 2 diabetes mellitus with diabetic polyneuropathy: Secondary | ICD-10-CM

## 2022-12-18 ENCOUNTER — Encounter: Payer: Self-pay | Admitting: Cardiovascular Disease

## 2022-12-18 NOTE — Progress Notes (Signed)
Cardiology Office Note   Date:  12/19/2022   ID:  Kelly Lang, DOB 09-24-1961, MRN [id number]  PCP:  Marcine Matar, MD  Cardiologist:   Kristeen Miss, MD   Chief Complaint  Patient presents with   Atrial Fibrillation        1. Paroxysmal Atrial fibrillation 2. Hypothyroidism 3. Type 2 diabetes mellitus 4. Chronic systolic CHF    Kelly Lang is a 62 y.o. female with a hx of T2DM, HL, thyroid disease and atrial fibrillation.  She had been on Synthroid for some time after a thyroid bx in 1996.  She was admitted 10/2013 with AFib with RVR.  Echo (10/26/13):  EF 40-45%, normal wall motion, mild LAE.  CHADS2-VASc= 3 (LV dysfunction, DM, female).  She was placed on coumadin.  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  She was last seen 11/29/2013.  She was set up for DCCV after 3-4 weeks of appropriate anticoagulation.  DCCV was performed 12/07/2013 but was unsuccessful.  She returns for follow up.  She is stable. She is frustrated with her atrial fibrillation. She does get tired more easily. She denies chest pain. She denies significant dyspnea. She is NYHA class II. She denies syncope. She denies orthopnea, PND, edema, palpitations.  Feb. 16, 2015:  She had a cardioversion twice.  Converted back to Afib both times. She has had a stress myoview which was negative for ischemia.    we will start flecainide today.  February 15, 2014:  Kelly Lang returns for further evaluation of her atrial fib.  She has been on Flecainide 100 BID for a month.  She started feeling better about 2 weeks ago.  She has converted to NSR.   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    Sept. 16, 2015:  Kelly Lang is doing well.  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     February 13, 2015:  Kelly Lang is a 62 y.o. female who presents for follow-up of atrial fibrillation. Is on coumadin . INR is 2.0 this am.   No CP or dyspnea.    Glucose levels are ok.   HbA1C is a bit high . Was started on Tragenta. Only a little bit of exercise.   Missed 4 days of flecainide due to insurance not paying for it. Staying in NSR  Nov. 29, 2016:  Had a CVA in Oct, 2016.  Had not been on her coumadin and all of her medications.  Echo revealed severe LV dysfunction with EF of 20-25%.   Was started on Xarelto and metoprolol at that time   Has had some some chills and achy feeling for the past couple of days. No cough, no fever, no runny nose.  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     February 08, 2016:  Kelly Lang has been back on her meds.   She had stopped her medications and had developed worsening congestive heart failure. Previous echocardiogram revealed an EF of 25%. She restart her medications and the recent echo 2 days ago reveals recovery of her left ventricular systolic function to an ejection fraction 45% which is her normal.  She's feeling quite a bit better. She has taken flecainide in the past which helped her stay in normal sinus rhythm.  She stopped the flecainide due to lack of insurance. She now has [employer] and wants to get back on flecainide.  March 07, 2016:  Kelly Lang is seen back today for follow-up of her atrial fibrillation. She's been on flecainide 100 mg twice a day.  She's had her stress test for flecainide testing and it revealed no QRS widening. She had an unsuccessful cardioversion Jan. 7, 2015 She cannot tell how much she is in NSR vs. Afib.   Oct. 9, 2017:  She is s/p cardioversion in April She is feeling better.  Able to walk without any dyspnea   May 18, 2017:  Kelly Lang is seen back today for follow up  No CP or dyspnea  Is now on Jardiance for the past week   Apr 12, 2018  Kelly Lang is seen for follow up visit  Has had an ablation since I last  Feels so much  better.    Able to walk but not getting any regular exercise  Still on Xarelto.  Body mass index is 39.88 kg/m.  October 05, 2019: Kelly Lang is seen today for follow-up visit.  He has a history of paroxysmal atrial fibrillation, obesity, hyperlipidemia and diabetes mellitus. Is not exercising much .  Working on getting diabetes regulated   Wt is 250 ( up 6 lbs)  Is s/p AFIB ablation    February 26, 2021:  Kelly Lang is seen today - hx of atrial fib, - s/p ablation Hx of HLD, DM, obesity Wt is 235 lbs   Chol levels are ok  Chol = 115 HDL = 32 LDL = 49 Trigs are 208  !!  Is trying to lose some weight   Mar 31, 2022: Kelly Lang is seen for follow  up Is maintaining  NSR   She still on Xarelto.  Weight today is 231 pounds.  She is down 19 pounds from 3 years ago.  Oct. 10, 2023 Kelly Lang is seen for follow up of her atrial fib, obesity, Was hospitalized in July with dyspnea and intrascapular pain  Early in the am Lasted for several hours  Went to work  Recurrent several days later,  went to [employer] [facility], then ultimately to [facility]    Aorta CTA : showed no dissection  Tropononis 106, 80  Heart catheterization on June 24, 2022 revealed 100% occlusion of the proximal to mid LAD.  This was a very heavily calcified segment.  She has right to left collaterals that fill the LAD in a retrograde fashion.  There is thrombus noted just distal to the high-grade obstruction.  The left circumflex and the right coronary artery are normal.  LV function was mildly reduced with an EF of 45 to 50%.  The proximal LAD lesion was crossed with a wire.  There was no balloon that was able to cross.  The plan was to anticoagulate for 4 to 6 weeks and then consider repeat procedure by the CTO team.  There was also consideration to perform a orbital atherectomy prior to stenting.   Still has the intrascapular pain with exertion , which is associated with dyspnea   Jan. 19, 2024 Kelly Lang had PCI of her  prox LAD , complicated by distal embolization  Hx of PAF Is exercising only a little bit Is working quite a [employer] is 234 Still struggling with her weight. She does not have any acute problems.  Will have her see Sharyn Lull, NP in 3 months for further counseling on diet,  exercise, weight loss    Past Medical History:  Diagnosis Date   Arthritis    "all over" (06/30/2017)   Chicken pox    Dyslipidemia    Dysrhythmia ablation 2018   a-fib   GERD (gastroesophageal reflux disease)    H/O: hypothyroidism    a. thyroid biopsy 1996 with synthroid. Stopped taking rx and was loss to follow up b. normal thyroid function 10/20/13   High cholesterol    Hx of cardiovascular stress test    ETT/Lexiscan Myoview (12/2013):  No ischemia; not gated; low risk.   Hypertension    Obesity    Persistent atrial fibrillation (HCC)    a diagnosed 10/25/2013   Psoriasis    on Skyrizi   Seasonal allergies    Stroke [facility]) 2016   "some speech problems since" (06/30/2017)   Tachycardia induced cardiomyopathy (HCC)    a. 2/2 Afib with RVR for unknown duration (EF: 40-45%)   Thyroid disease    Type II diabetes mellitus (HCC)    a. hg A1c 10, newly diagnosed (10/26/13)    Past Surgical History:  Procedure Laterality Date   ATRIAL FIBRILLATION ABLATION  06/30/2017   ATRIAL FIBRILLATION ABLATION N/A 06/30/2017   Procedure: Atrial Fibrillation Ablation;  Surgeon: Hillis Range, MD;  [facility]: [facility] IN[facility]IVE [facility] LAB;  Service: Cardiovascular;  Laterality: N/A;   BICEPT TENODESIS Right 05/24/2020   Procedure: BICEPS TENODESIS;  Surgeon: Cammy Copa, MD;  Location: [facility];  Service: Orthopedics;  Laterality: Right;   BIOPSY THYROID  1996   CARDIOVERSION N/A 12/07/2013   Procedure: CARDIOVERSION;  Surgeon: Everette Rank, MD;  Location: [facility] ENDOSCOPY;  Service: Cardiovascular;  Laterality: N/A;   CARDIOVERSION N/A 03/10/2016   Procedure: CARDIOVERSION;  Surgeon: Vesta Mixer, MD;  Location:  [facility]COPY;  Service: Cardiovascular;  Laterality: N/A;   CORONARY ATHERECTOMY N/A 09/18/2022   Procedure: CORONARY ATHERECTOMY;  Surgeon: Tonny Bollman, MD;  Location: [facility] INVASIVE CV LAB;  Service: Cardiovascular;  Laterality: N/A;   CORONARY BALLOON ANGIOPLASTY N/A 06/24/2022   Procedure: CORONARY BALLOON ANGIOPLASTY;  Surgeon: Lyn Records, MD;  Location: [facility]VAS[facility];  Service: Cardiovascular;  Laterality: N/A;   CORONARY STENT INTERVENTION N/A 09/18/2022   Procedure: CORONARY STENT INTERVENTION;  Surgeon: Tonny Bollman, MD;  Location: [facility] INVASIVE CV LAB;  Service: Cardiovascular;  Laterality: N/A;   ESOPHAGOGASTRODUODENOSCOPY N/A 12/07/2017   Procedure: ESOPHAGOGASTRODUODENOSCOPY (EGD);  Surgeon: Hilarie Fredrickson, MD;  Location: [facility] ENDOSCOPY;  Service: Endoscopy;  Laterality: N/A;   EYE MUSCLE SURGERY Right ~ 1966   LEFT HEART CATH AND CORONARY ANGIOGRAPHY N/A 06/24/2022   Procedure: LEFT HEART CATH AND CORONARY ANGIOGRAPHY;  Surgeon: Lyn Records, MD;  Location: [facility];  Service: Cardiovascular;  Laterality: N/A;   SHOULDER ARTHROSCOPY WITH ROTATOR CUFF REPAIR Right 05/24/2020   Procedure: right shoulder arthroscopy, rotator cuff tear repair biceps tenodesis;  Surgeon: Cammy Copa, MD;  Location: [facility];  Service: Orthopedics;  Laterality: Right;   TEE WITHOUT CARDIOVERSION N/A 06/29/2017   Procedure: TRANSESOPHAGEAL ECHOCARDIOGRAM (TEE);  Surgeon: Pricilla Riffle, MD;  Location: [facility] ENDOSCOPY;  Service: Cardiovascular;  Laterality: N/A;   TONSILLECTOMY  1971     Current Outpatient Medications  Medication Sig Dispense Refill   acetaminophen (TYLENOL) 500 MG tablet Take 1,000 mg by mouth every 6 (six) hours as needed for headache.     atorvastatin (LIPITOR) 80 MG tablet Take 1 tablet (80 mg total) by mouth daily. 90  tablet 3   clopidogrel (PLAVIX) 75 MG tablet Take 1 tablet (75 mg total) by mouth daily with breakfast. 90 tablet 3   Continuous  Blood Gluc Receiver (DEXCOM G7 RECEIVER) DEVI Use to monitor BG continuously 1 each 0   Continuous Blood Gluc Sensor (DEXCOM G7 SENSOR) MISC CHANGE SENSOR EVERY 10 DAYS 3 each 3   diclofenac Sodium (VOLTAREN) 1 % GEL Apply 2 g topically 4 (four) times daily. 100 g 2   DULoxetine (CYMBALTA) 30 MG capsule Take 1 capsule by mouth once daily 90 capsule 0   glipiZIDE (GLUCOTROL XL) 5 MG 24 hr tablet Take 1 tablet by mouth once daily with breakfast 90 tablet 0   insulin glargine-yfgn (SEMGLEE, YFGN,) 100 UNIT/ML Pen INJECT 80 UNITS  SUBCUTANEOUSLY AT BEDTIME 30 mL 1   isosorbide mononitrate (IMDUR) 30 MG 24 hr tablet Take 1 tablet (30 mg total) by mouth daily. 90 tablet 3   lisinopril (ZESTRIL) 10 MG tablet Take 1 tablet (10 mg total) by mouth daily. 90 tablet 0   metFORMIN (GLUCOPHAGE) 1000 MG tablet Take 1 tablet (1,000 mg total) by mouth 2 (two) times daily with a meal. 180 tablet 0   metoCLOPramide (REGLAN) 10 MG tablet Take 1 tablet (10 mg total) by mouth every 6 (six) hours. 30 tablet 0   metoprolol succinate (TOPROL-XL) 25 MG 24 hr tablet Take 3 tablets (75 mg total) by mouth daily. 270 tablet 3   ONETOUCH DELICA LANCETS 33G MISC Use as instructed to check blood sugar up to 3 times daily. 100 each 11   ONETOUCH VERIO test strip USE STRIPS TO CHECK GLUCOSE UP TO THREE TIMES DAILY AS DIRECTED 100 each 6   OZEMPIC, 2 MG/DOSE, 8 MG/3ML SOPN INJECT 2MG  SUBCUTANEOUSLY ONCE A WEEK 9 mL 0   pantoprazole (PROTONIX) 40 MG tablet Take 1 tablet (40 mg total) by mouth daily. 30 tablet 3   rivaroxaban (XARELTO) 20 MG TABS tablet Take 1 tablet (20 mg total) by mouth daily with supper. 90 tablet 3   SKYRIZI PEN 150 MG/ML SOAJ Inject 150 mg into the skin every 3 (three) months.     TRUEPLUS PEN NEEDLES 32G X 4 MM MISC USE AS DIRECTED AT BEDTIME. 100 each 2   No current facility-administered medications for this visit.    Allergies:   Jardiance [empagliflozin] and Codeine    Social History:  The patient   reports that she quit smoking about 12 years ago. Her smoking use included cigarettes. She has a 22.00 pack-year smoking history. She has never used smokeless tobacco. She reports current alcohol use. She reports that she does not use drugs.   Family History:  The patient's family history includes Hyperlipidemia in her mother; Hypertension in her mother. She was adopted.    Physical Exam: Blood pressure 112/68, pulse 74, height 5' 5.5" (1.664 m), weight 234 lb 12.8 oz (106.5 kg), last menstrual period 04/15/2015, SpO2 96 %.       GEN:  obese middle age female  in no acute distress HEENT: Normal NECK: No JVD; No carotid bruits LYMPHATICS: No lymphadenopathy CARDIAC: RRR , no murmurs, rubs, gallops RESPIRATORY:  Clear to auscultation without rales, wheezing or rhonchi  ABDOMEN: Soft, non-tender, non-distended MUSCULOSKELETAL:  No edema; No deformity  SKIN: Warm and dry NEUROLOGIC:  Alert and oriented x 3   EKG:      Recent Labs: 06/21/2022: B Natriuretic Peptide 294.6 06/25/2022: Magnesium 2.0 10/09/2022: ALT 21; BUN 10; Creatinine, Ser 0.69; Hemoglobin 12.8;  Platelets 265; Potassium 4.6; Sodium 140    Lipid Panel    Component Value Date/Time   CHOL 122 10/09/2022 0829   TRIG 118 10/09/2022 0829   HDL 36 (L) 10/09/2022 0829   CHOLHDL 3.4 10/09/2022 0829   CHOLHDL 6.8 06/25/2022 0514   VLDL 61 (H) 06/25/2022 0514   LDLCALC 65 10/09/2022 0829   LDLDIRECT 114.0 02/13/2015 0750      Wt Readings from Last 3 Encounters:  12/19/22 234 lb 12.8 oz (106.5 kg)  10/15/22 234 lb 6.4 oz (106.3 kg)  10/03/22 233 lb (105.7 kg)      Other studies Reviewed: Additional studies/ records that were reviewed today include: . Review of the above records demonstrates:    ASSESSMENT AND PLAN:  1. Atrial fibrillation-she status post A. fib ablation.  She is not having any further episodes of afib     2. CAD : She had successful atherectomy and stenting several months ago.  She is  doing well.  No angina.      3. Type 2 diabetes mellitus -  4.   Hyperlipidemia:   5.  Moderate obesity:    Body mass index is 38.48 kg/m. I strongly encouraged her to work on weight loss.  She has very little time for exercise.  Will have her see Christen Bame, NP  for some counseling regarding improving her diet and working in some exercise/ improving lifestyle     Current medicines are reviewed at length with the patient today.  The patient does not have concerns regarding medicines.  The following changes have been made:  no change  Labs/ tests ordered today include:   No orders of the defined types were placed in this encounter.   Disposition:    3 months with Christen Bame, NP Will see me in a year - sooner if needed.     Mertie Moores, MD  12/19/2022 8:58 AM    [address], [place], [place]  [address] Phone: [phone]; Fax: [phone]

## 2022-12-19 ENCOUNTER — Ambulatory Visit: Payer: BC Managed Care – PPO | Attending: Cardiovascular Disease | Admitting: Cardiovascular Disease

## 2022-12-19 ENCOUNTER — Encounter: Payer: Self-pay | Admitting: Cardiovascular Disease

## 2022-12-19 VITALS — BP 112/68 | HR 74 | Ht 65.5 in | Wt 234.8 lb

## 2022-12-19 DIAGNOSIS — Z6838 Body mass index (BMI) 38.0-38.9, adult: Secondary | ICD-10-CM

## 2022-12-19 DIAGNOSIS — I2510 Atherosclerotic heart disease of native coronary artery without angina pectoris: Secondary | ICD-10-CM | POA: Diagnosis not present

## 2022-12-19 DIAGNOSIS — E6609 Other obesity due to excess calories: Secondary | ICD-10-CM

## 2022-12-19 DIAGNOSIS — I480 Paroxysmal atrial fibrillation: Secondary | ICD-10-CM

## 2022-12-19 NOTE — Patient Instructions (Signed)
Medication Instructions:  Your physician recommends that you continue on your current medications as directed. Please refer to the Current Medication list given to you today.  *If you need a refill on your cardiac medications before your next appointment, please call your pharmacy*   Lab Work: NONE If you have labs (blood work) drawn today and your tests are completely normal, you will receive your results only by: [place] (if you have MyChart) OR A paper copy in the mail If you have any lab test that is abnormal or we need to change your treatment, we will call you to review the results.   Testing/Procedures: NONE   Follow-Up: At [facility], you and your health needs are our priority.  As part of our continuing mission to provide you with exceptional heart care, we have created designated Provider Care Teams.  These Care Teams include your primary Cardiologist (physician) and Advanced Practice Providers (APPs -  Physician Assistants and Nurse Practitioners) who all work together to provide you with the care you need, when you need it.  We recommend signing up for the patient portal called "MyChart".  Sign up information is provided on this After Visit Summary.  MyChart is used to connect with patients for Virtual Visits (Telemedicine).  Patients are able to view lab/test results, encounter notes, upcoming appointments, etc.  Non-urgent messages can be sent to your provider as well.   To learn more about what you can do with MyChart, go to [email or web address].    Your next appointment:   3 month(s)  Provider:   Christen Bame, NP

## 2022-12-22 ENCOUNTER — Other Ambulatory Visit: Payer: Self-pay | Admitting: Internal Medicine

## 2022-12-22 DIAGNOSIS — I152 Hypertension secondary to endocrine disorders: Secondary | ICD-10-CM

## 2022-12-23 NOTE — Telephone Encounter (Signed)
Requested medication (s) are due for refill today: yes  Requested medication (s) are on the active medication list: yes  Last refill:  09/08/22 #90 0 refills  Future visit scheduled: yes in 2 weeks  Notes to clinic:  no refills remain. Do you want to give #90 or #30 until next OV?     Requested Prescriptions  Pending Prescriptions Disp Refills   lisinopril (ZESTRIL) 10 MG tablet [Pharmacy Med Name: Lisinopril 10 MG Oral Tablet] 90 tablet 0    Sig: Take 1 tablet by mouth once daily     Cardiovascular:  ACE Inhibitors Passed - 12/22/2022  7:39 AM      Passed - Cr in normal range and within 180 days    Creat  Date Value Ref Range Status  06/16/2016 0.90 0.50 - 1.05 mg/dL Final    Comment:      For patients > or = 60 years of age: The upper reference limit for Creatinine is approximately 13% higher for people identified as African-American.      Creatinine, Ser  Date Value Ref Range Status  10/09/2022 0.69 0.57 - 1.00 mg/dL Final   Creatinine, POC  Date Value Ref Range Status  09/24/2020 300 mg/dL Final         Passed - K in normal range and within 180 days    Potassium  Date Value Ref Range Status  10/09/2022 4.6 3.5 - 5.2 mmol/L Final         Passed - Patient is not pregnant      Passed - Last BP in normal range    BP Readings from Last 1 Encounters:  12/19/22 112/68         Passed - Valid encounter within last 6 months    Recent Outpatient Visits           3 months ago 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([place])   [place] Karle Plumber B, MD   7 months ago 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[facility])   [place], [place], [place]   10 months ago [place] Herman, [place] W, NP   1 year ago 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[facility])   [place] Karle Plumber B, MD   1 year ago Type 2  diabetes mellitus with obesity [facility])   [place], MD       Future Appointments             In 2 weeks Ladell Pier, MD [place]   In 3 months [place], Lanice Schwab, NP [place] [facility], [place]

## 2022-12-30 LAB — HM DIABETES EYE EXAM

## 2023-01-05 ENCOUNTER — Other Ambulatory Visit: Payer: Self-pay | Admitting: "Endocrinology

## 2023-01-07 ENCOUNTER — Ambulatory Visit
Admission: EM | Admit: 2023-01-07 | Discharge: 2023-01-07 | Disposition: A | Discharge status: Home / Self Care | Payer: BC Managed Care – PPO | Attending: Urgent Care | Admitting: Urgent Care

## 2023-01-07 DIAGNOSIS — H6992 Unspecified Eustachian tube disorder, left ear: Secondary | ICD-10-CM | POA: Diagnosis not present

## 2023-01-07 DIAGNOSIS — J309 Allergic rhinitis, unspecified: Secondary | ICD-10-CM

## 2023-01-07 DIAGNOSIS — I4891 Unspecified atrial fibrillation: Secondary | ICD-10-CM | POA: Diagnosis not present

## 2023-01-07 MED ORDER — CETIRIZINE HCL 10 MG PO TABS: 10.0000 mg | ORAL_TABLET | Freq: Every day | ORAL | 0 refills | Status: DC

## 2023-01-07 MED ORDER — FLUTICASONE PROPIONATE 50 MCG/ACT NA SUSP: 2.0000 | Freq: Every day | NASAL | 0 refills | Status: DC

## 2023-01-07 NOTE — ED Triage Notes (Signed)
Pt c/o left earache started yesterday-NAD-steady gait

## 2023-01-07 NOTE — ED Provider Notes (Signed)
[facility] URGENT [facility]  Note:  This document was prepared using Systems analyst and may include unintentional dictation errors.  MRN: [id number] DOB: [birth year]  Subjective:   Kelly Lang is a 62 y.o. female presenting for 1 day history of left ear pain, left-sided facial fullness, sinus drainage.  Patient has a history of allergic rhinitis.  Generally has a cough that is unchanged.  No ear drainage, tinnitus, throat pain, painful swallowing, chest pain, shortness of breath or wheezing.  Patient has type 2 diabetes treated with insulin, history of paroxysmal atrial fibrillation and is status post cardioversion.  Has not had any issues with it since 2018.  Has not tried any medications for her symptoms out of concern for her chronic conditions.  No current facility-administered medications for this encounter.  Current Outpatient Medications:    acetaminophen (TYLENOL) 500 MG tablet, Take 1,000 mg by mouth every 6 (six) hours as needed for headache., Disp: , Rfl:    atorvastatin (LIPITOR) 80 MG tablet, Take 1 tablet (80 mg total) by mouth daily., Disp: 90 tablet, Rfl: 3   clopidogrel (PLAVIX) 75 MG tablet, Take 1 tablet (75 mg total) by mouth daily with breakfast., Disp: 90 tablet, Rfl: 3   Continuous Blood Gluc Receiver ([place]) DEVI, Use to monitor BG continuously, Disp: 1 each, Rfl: 0   Continuous Blood Gluc Sensor (DEXCOM G7 SENSOR) MISC, CHANGE SENSOR EVERY 10 DAYS, Disp: 3 each, Rfl: 3   diclofenac Sodium (VOLTAREN) 1 % GEL, Apply 2 g topically 4 (four) times daily., Disp: 100 g, Rfl: 2   DULoxetine (CYMBALTA) 30 MG capsule, Take 1 capsule by mouth once daily, Disp: 90 capsule, Rfl: 0   glipiZIDE (GLUCOTROL XL) 5 MG 24 hr tablet, Take 1 tablet by mouth once daily with breakfast, Disp: 90 tablet, Rfl: 0   insulin glargine-yfgn (SEMGLEE, YFGN,) 100 UNIT/ML Pen, INJECT 80 UNITS  SUBCUTANEOUSLY AT BEDTIME, Disp: 30 mL, Rfl: 1   isosorbide  mononitrate (IMDUR) 30 MG 24 hr tablet, Take 1 tablet (30 mg total) by mouth daily., Disp: 90 tablet, Rfl: 3   lisinopril (ZESTRIL) 10 MG tablet, Take 1 tablet by mouth once daily, Disp: 90 tablet, Rfl: 3   metFORMIN (GLUCOPHAGE) 1000 MG tablet, Take 1 tablet (1,000 mg total) by mouth 2 (two) times daily with a meal., Disp: 180 tablet, Rfl: 0   metoCLOPramide (REGLAN) 10 MG tablet, Take 1 tablet (10 mg total) by mouth every 6 (six) hours., Disp: 30 tablet, Rfl: 0   metoprolol succinate (TOPROL-XL) 25 MG 24 hr tablet, Take 3 tablets (75 mg total) by mouth daily., Disp: 270 tablet, Rfl: 3   ONETOUCH DELICA LANCETS 32T MISC, Use as instructed to check blood sugar up to 3 times daily., Disp: 100 each, Rfl: 11   ONETOUCH VERIO test strip, USE STRIPS TO CHECK GLUCOSE UP TO THREE TIMES DAILY AS DIRECTED, Disp: 100 each, Rfl: 6   pantoprazole (PROTONIX) 40 MG tablet, Take 1 tablet (40 mg total) by mouth daily., Disp: 30 tablet, Rfl: 3   rivaroxaban (XARELTO) 20 MG TABS tablet, Take 1 tablet (20 mg total) by mouth daily with supper., Disp: 90 tablet, Rfl: 3   Semaglutide, 2 MG/DOSE, (OZEMPIC, 2 MG/DOSE,) 8 MG/3ML SOPN, INJECT 2MG  INTO THE SKIN ONCE A WEEK, Disp: 9 mL, Rfl: 0   SKYRIZI PEN 150 MG/ML SOAJ, Inject 150 mg into the skin every 3 (three) months., Disp: , Rfl:    TRUEPLUS PEN NEEDLES 32G X  4 MM MISC, USE AS DIRECTED AT BEDTIME., Disp: 100 each, Rfl: 2   Allergies  Allergen Reactions   Jardiance [Empagliflozin] Other (See Comments)    Yeast infections   Codeine Itching and Rash    Past Medical History:  Diagnosis Date   Arthritis    "all over" (06/30/2017)   Chicken pox    Dyslipidemia    Dysrhythmia ablation 2018   a-fib   GERD (gastroesophageal reflux disease)    H/O: hypothyroidism    a. thyroid biopsy 1996 with synthroid. Stopped taking rx and was loss to follow up b. normal thyroid function 10/20/13   High cholesterol    Hx of cardiovascular stress test    ETT/Lexiscan Myoview  (12/2013):  No ischemia; not gated; low risk.   Hypertension    Obesity    Persistent atrial fibrillation ([place])    a diagnosed 10/25/2013   Psoriasis    on Skyrizi   Seasonal allergies    Stroke [facility]) 2016   "some speech problems since" (06/30/2017)   Tachycardia induced cardiomyopathy (HCC)    a. 2/2 Afib with RVR for unknown duration (EF: 40-45%)   Thyroid disease    Type II diabetes mellitus ([place])    a. hg A1c 10, newly diagnosed (10/26/13)     Past Surgical History:  Procedure Laterality Date   ATRIAL FIBRILLATION ABLATION  06/30/2017   ATRIAL FIBRILLATION ABLATION N/A 06/30/2017   Procedure: Atrial Fibrillation Ablation;  Surgeon: Thompson Grayer, MD;  Location: [place] CV LAB;  Service: Cardiovascular;  Laterality: N/A;   BICEPT TENODESIS Right 05/24/2020   Procedure: BICEPS TENODESIS;  Surgeon: Meredith Pel, MD;  Location: [place];  Service: Orthopedics;  Laterality: Right;   BIOPSY THYROID  1996   CARDIOVERSION N/A 12/07/2013   Procedure: CARDIOVERSION;  Surgeon: Casandra Doffing, MD;  Location: [facility] ENDOSCOPY;  Service: Cardiovascular;  Laterality: N/A;   CARDIOVERSION N/A 03/10/2016   Procedure: CARDIOVERSION;  Surgeon: Thayer Headings, MD;  Location: [facility] ENDOSCOPY;  Service: Cardiovascular;  Laterality: N/A;   CORONARY ATHERECTOMY N/A 09/18/2022   Procedure: CORONARY ATHERECTOMY;  Surgeon: Sherren Mocha, MD;  Location: [place] CV LAB;  Service: Cardiovascular;  Laterality: N/A;   CORONARY BALLOON ANGIOPLASTY N/A 06/24/2022   Procedure: CORONARY BALLOON ANGIOPLASTY;  Surgeon: Belva Crome, MD;  Location: [place] CV LAB;  Service: Cardiovascular;  Laterality: N/A;   CORONARY STENT INTERVENTION N/A 09/18/2022   Procedure: CORONARY STENT INTERVENTION;  Surgeon: Sherren Mocha, MD;  Location: [place] CV LAB;  Service: Cardiovascular;  Laterality: N/A;   ESOPHAGOGASTRODUODENOSCOPY N/A 12/07/2017   Procedure: ESOPHAGOGASTRODUODENOSCOPY (EGD);   Surgeon: Irene Shipper, MD;  Location: [facility] ENDOSCOPY;  Service: Endoscopy;  Laterality: N/A;   EYE MUSCLE SURGERY Right ~ 1966   LEFT HEART CATH AND CORONARY ANGIOGRAPHY N/A 06/24/2022   Procedure: LEFT HEART CATH AND CORONARY ANGIOGRAPHY;  Surgeon: Belva Crome, MD;  Location: [place] CV LAB;  Service: Cardiovascular;  Laterality: N/A;   SHOULDER ARTHROSCOPY WITH ROTATOR CUFF REPAIR Right 05/24/2020   Procedure: right shoulder arthroscopy, rotator cuff tear repair biceps tenodesis;  Surgeon: Meredith Pel, MD;  Location: [place];  Service: Orthopedics;  Laterality: Right;   TEE WITHOUT CARDIOVERSION N/A 06/29/2017   Procedure: TRANSESOPHAGEAL ECHOCARDIOGRAM (TEE);  Surgeon: Fay Records, MD;  Location: [facility] ENDOSCOPY;  Service: Cardiovascular;  Laterality: N/A;   TONSILLECTOMY  1971    Family History  Adopted: Yes  Problem Relation Age of Onset  Hypertension Mother    Hyperlipidemia Mother     Social History   Tobacco Use   Smoking status: Former    Packs/day: 1.00    Years: 22.00    Total pack years: 22.00    Types: Cigarettes    Quit date: 12/01/2010    Years since quitting: 12.1   Smokeless tobacco: Never  Vaping Use   Vaping Use: Never used  Substance Use Topics   Alcohol use: Yes    Comment: social   Drug use: No    ROS   Objective:   Vitals: BP 117/71 (BP Location: Right Arm)   Pulse 70   Temp 98.9 F (37.2 C) (Oral)   Resp 18   LMP 04/15/2015 (LMP Unknown) Comment: last in may  SpO2 95%   Physical Exam Constitutional:      General: She is not in acute distress.    Appearance: Normal appearance. She is well-developed and normal weight. She is not ill-appearing, toxic-appearing or diaphoretic.  HENT:     Head: Normocephalic and atraumatic.     Right Ear: Tympanic membrane, ear canal and external ear normal. No drainage or tenderness. No middle ear effusion. There is no impacted cerumen. Tympanic membrane is not injected,  perforated, erythematous or bulging.     Left Ear: Tympanic membrane, ear canal and external ear normal. No drainage or tenderness.  No middle ear effusion. There is no impacted cerumen. Tympanic membrane is not injected, perforated, erythematous or bulging.     Nose: Nose normal. No congestion or rhinorrhea.     Mouth/Throat:     Mouth: Mucous membranes are moist. No oral lesions.     Pharynx: No pharyngeal swelling, oropharyngeal exudate, posterior oropharyngeal erythema or uvula swelling.     Tonsils: No tonsillar exudate or tonsillar abscesses.  Eyes:     General: No scleral icterus.       Right eye: No discharge.        Left eye: No discharge.     Extraocular Movements: Extraocular movements intact.     Right eye: Normal extraocular motion.     Left eye: Normal extraocular motion.     Conjunctiva/sclera: Conjunctivae normal.  Cardiovascular:     Rate and Rhythm: Normal rate.  Pulmonary:     Effort: Pulmonary effort is normal.  Musculoskeletal:     Cervical back: Normal range of motion and neck supple.  Lymphadenopathy:     Cervical: No cervical adenopathy.  Skin:    General: Skin is warm and dry.  Neurological:     General: No focal deficit present.     Mental Status: She is alert and oriented to person, place, and time.  Psychiatric:        Mood and Affect: Mood normal.        Behavior: Behavior normal.     Assessment and Plan :   PDMP not reviewed this encounter.  1. Eustachian tube dysfunction, left   2. Allergic rhinitis, unspecified seasonality, unspecified trigger   3. Atrial fibrillation, unspecified type ([place])     Unremarkable ENT exam.  Will use conservative management for what I suspect is eustachian tube dysfunction.  Recommended starting Flonase, Zyrtec, avoid decongestants like Sudafed.  Will also hold off on prednisone due to her chronic conditions.  Counseled patient on potential for adverse effects with medications prescribed/recommended today, ER and  return-to-clinic precautions discussed, patient verbalized understanding.    Jaynee Eagles, PA-C 01/07/23 1049

## 2023-01-09 ENCOUNTER — Encounter: Payer: Self-pay | Admitting: Internal Medicine

## 2023-01-09 ENCOUNTER — Ambulatory Visit: Payer: BC Managed Care – PPO | Attending: Internal Medicine | Admitting: Internal Medicine

## 2023-01-09 VITALS — BP 123/81 | HR 81 | Temp 98.2°F | Ht 65.0 in | Wt 233.0 lb

## 2023-01-09 DIAGNOSIS — E669 Obesity, unspecified: Secondary | ICD-10-CM

## 2023-01-09 DIAGNOSIS — T887XXA Unspecified adverse effect of drug or medicament, initial encounter: Secondary | ICD-10-CM

## 2023-01-09 DIAGNOSIS — I5022 Chronic systolic (congestive) heart failure: Secondary | ICD-10-CM

## 2023-01-09 DIAGNOSIS — E1159 Type 2 diabetes mellitus with other circulatory complications: Secondary | ICD-10-CM | POA: Diagnosis not present

## 2023-01-09 DIAGNOSIS — Z6838 Body mass index (BMI) 38.0-38.9, adult: Secondary | ICD-10-CM

## 2023-01-09 DIAGNOSIS — E1169 Type 2 diabetes mellitus with other specified complication: Secondary | ICD-10-CM

## 2023-01-09 DIAGNOSIS — I2510 Atherosclerotic heart disease of native coronary artery without angina pectoris: Secondary | ICD-10-CM

## 2023-01-09 DIAGNOSIS — Z1231 Encounter for screening mammogram for malignant neoplasm of breast: Secondary | ICD-10-CM

## 2023-01-09 DIAGNOSIS — I152 Hypertension secondary to endocrine disorders: Secondary | ICD-10-CM

## 2023-01-09 LAB — POCT GLYCOSYLATED HEMOGLOBIN (HGB A1C): HbA1c, POC (controlled diabetic range): 7.5 % — AB (ref 0.0–7.0)

## 2023-01-09 LAB — GLUCOSE, POCT (MANUAL RESULT ENTRY): POC Glucose: 223 mg/dl — AB (ref 70–99)

## 2023-01-09 MED ORDER — LORATADINE 10 MG PO TABS: 10.0000 mg | ORAL_TABLET | Freq: Every day | ORAL | 0 refills | Status: DC

## 2023-01-09 NOTE — Progress Notes (Signed)
Patient ID: Kelly Lang, female    DOB: May 21, 1961  MRN: [id number]  CC: Diabetes (Dm f/u./Pt went to UC & was prescribed Citirizine - causing drowsiness & requesting a change./Already received flu vax. )   Subjective: Kelly Lang is a 62 y.o. 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   HTN/systolic CHF/CAD/a.flutter: Since last visit with me, patient underwent cardiac catheterization 09/19/2022 and found to have LAD lesion for which stent was placed. -No chest pain since then. Confirms taking lisinopril 20 mg daily, metoprolol 75 mg daily, isosorbide 30 mg daily, Plavix 75 mg daily, atorvastatin 80 mg daily and Xarelto 20 mg daily No bruising or bleeding   DM:  Results for orders placed or performed in visit on 01/09/23  POCT glucose (manual entry)  Result Value Ref Range   POC Glucose 223 (A) 70 - 99 mg/dl  POCT glycosylated hemoglobin (Hb A1C)  Result Value Ref Range   Hemoglobin A1C     HbA1c POC (<> result, manual entry)     HbA1c, POC (prediabetic range)     HbA1c, POC (controlled diabetic range) 7.5 (A) 0.0 - 7.0 %  Saw Dr. Dorris Fetch 10/15/2022.  A1c had improved to 7.4. Consistently taking Semglee 80 units daily, Ozempic 2 mg weekly, glipizide 5 mg daily and metformin 1 g twice a day. -no longer has Dexcom, has to wait until her deductible gets to a certain level before she could afford to get it again.  Checking BS before BF  and gives range of 130s, before dinner range 98-150s.  No lows.   She tries her best to eat healthy.  Not getting in as much exercise as she would like at this time.  Seen in UC 2 days ago for LT ear pain, sided facial fullness and sinus drainage.  Diagnosed with eustachian  tube dysfunction and allergic rhinitis.  Prescribed Zyrtec and Flonase.  Zyrtec has caused a lot of drowsiness.  Reports symptoms are better but would like something other than Zyrtec  HM: never called for MMG.  She reports completing Shingrix vac series and COVID booster at [facility]. Due for PAP.   Patient Active Problem List   Diagnosis Date Noted   PAF (paroxysmal atrial fibrillation) (HCC)    Exertional angina 09/18/2022   Class 2 obesity 06/25/2022   NSTEMI (non-ST elevated myocardial infarction) ([place])    Thyroid nodule 06/22/2022   Trigger ring finger of left hand 02/05/2021   Trigger little finger of right hand 02/05/2021   Mixed hyperlipidemia 09/17/2020   Multinodular goiter 09/16/2019   Chronic pain disorder 08/09/2018   Diabetic polyneuropathy associated with type 2 diabetes mellitus ([place]) 08/09/2018   GI bleed 12/06/2017   Hepatic steatosis 12/04/2017   Cerebrovascular accident (CVA) due to embolism of left middle cerebral artery ([place])    Low back pain 04/03/2014   Vitamin D deficiency 01/20/2014   Essential hypertension, benign 01/20/2014   H/O: hypothyroidism    Class 2 severe obesity due to excess calories with serious comorbidity and body mass index (BMI) of 37.0 to 37.9 in adult [facility])    DM type 2 causing vascular disease ([place])    Arthritis    Atrial fibrillation  10/25/2013  Current Outpatient Medications on File Prior to Visit  Medication Sig Dispense Refill   acetaminophen (TYLENOL) 500 MG tablet Take 1,000 mg by mouth every 6 (six) hours as needed for headache.     atorvastatin (LIPITOR) 80 MG tablet Take 1 tablet (80 mg total) by mouth daily. 90 tablet 3   clopidogrel (PLAVIX) 75 MG tablet Take 1 tablet (75 mg total) by mouth daily with breakfast. 90 tablet 3   diclofenac Sodium (VOLTAREN) 1 % GEL Apply 2 g topically 4 (four) times daily. 100 g 2   DULoxetine (CYMBALTA) 30 MG capsule Take 1 capsule by mouth once daily 90 capsule 0    fluticasone (FLONASE) 50 MCG/ACT nasal spray Place 2 sprays into both nostrils daily. 16 g 0   glipiZIDE (GLUCOTROL XL) 5 MG 24 hr tablet Take 1 tablet by mouth once daily with breakfast 90 tablet 0   insulin glargine-yfgn (SEMGLEE, YFGN,) 100 UNIT/ML Pen INJECT 80 UNITS  SUBCUTANEOUSLY AT BEDTIME 30 mL 1   isosorbide mononitrate (IMDUR) 30 MG 24 hr tablet Take 1 tablet (30 mg total) by mouth daily. 90 tablet 3   lisinopril (ZESTRIL) 10 MG tablet Take 1 tablet by mouth once daily 90 tablet 3   metFORMIN (GLUCOPHAGE) 1000 MG tablet Take 1 tablet (1,000 mg total) by mouth 2 (two) times daily with a meal. 180 tablet 0   metoCLOPramide (REGLAN) 10 MG tablet Take 1 tablet (10 mg total) by mouth every 6 (six) hours. 30 tablet 0   metoprolol succinate (TOPROL-XL) 25 MG 24 hr tablet Take 3 tablets (75 mg total) by mouth daily. 270 tablet 3   ONETOUCH DELICA LANCETS 99991111 MISC Use as instructed to check blood sugar up to 3 times daily. 100 each 11   ONETOUCH VERIO test strip USE STRIPS TO CHECK GLUCOSE UP TO THREE TIMES DAILY AS DIRECTED 100 each 6   pantoprazole (PROTONIX) 40 MG tablet Take 1 tablet (40 mg total) by mouth daily. 30 tablet 3   rivaroxaban (XARELTO) 20 MG TABS tablet Take 1 tablet (20 mg total) by mouth daily with supper. 90 tablet 3   Semaglutide, 2 MG/DOSE, (OZEMPIC, 2 MG/DOSE,) 8 MG/3ML SOPN INJECT 2MG INTO THE SKIN ONCE A WEEK 9 mL 0   SKYRIZI PEN 150 MG/ML SOAJ Inject 150 mg into the skin every 3 (three) months.     TRUEPLUS PEN NEEDLES 32G X 4 MM MISC USE AS DIRECTED AT BEDTIME. 100 each 2   Continuous Blood Gluc Receiver (DEXCOM G7 RECEIVER) DEVI Use to monitor BG continuously (Patient not taking: Reported on 01/09/2023) 1 each 0   Continuous Blood Gluc Sensor (DEXCOM G7 SENSOR) MISC CHANGE SENSOR EVERY 10 DAYS (Patient not taking: Reported on 01/09/2023) 3 each 3   No current facility-administered medications on file prior to visit.    Allergies  Allergen Reactions   Jardiance  [Empagliflozin] Other (See Comments)    Yeast infections   Codeine Itching and Rash    Social History   Socioeconomic History   Marital status: Divorced    Spouse name: Not on file   Number of children: 0   Years of education: 14   Highest education level: Not on file  Occupational History   Occupation: Chief Technology Officer  Tobacco Use   Smoking status: Former    Packs/day: 1.00    Years: 22.00    Total pack years: 22.00    Types: Cigarettes    Quit date: 12/01/2010    Years since quitting: 12.1  Smokeless tobacco: Never  Vaping Use   Vaping Use: Never used  Substance and Sexual Activity   Alcohol use: Yes    Comment: social   Drug use: No   Sexual activity: Not Currently  Other Topics Concern   Not on file  Social History Narrative   Moved to [place] from [place] in 2002.     Fun: shop, sew, decorate   Denies any beliefs effecting healthcare   Social Determinants of Health   Financial Resource Strain: Not on file  Food Insecurity: No Food Insecurity (09/19/2022)   Hunger Vital Sign    Worried About Running Out of Food in the Last Year: Never true    Ran Out of Food in the Last Year: Never true  Transportation Needs: No Transportation Needs (09/19/2022)   PRAPARE - Hydrologist (Medical): No    Lack of Transportation (Non-Medical): No  Physical Activity: Not on file  Stress: Not on file  Social Connections: Not on file  Intimate Partner Violence: Not At Risk (09/19/2022)   Humiliation, Afraid, Rape, and Kick questionnaire    Fear of Current or Ex-Partner: No    Emotionally Abused: No    Physically Abused: No    Sexually Abused: No    Family History  Adopted: Yes  Problem Relation Age of Onset   Hypertension Mother    Hyperlipidemia Mother     Past Surgical History:  Procedure Laterality Date   ATRIAL FIBRILLATION ABLATION  06/30/2017   ATRIAL FIBRILLATION ABLATION N/A 06/30/2017   Procedure: Atrial Fibrillation  Ablation;  Surgeon: Thompson Grayer, MD;  Location: [place] CV LAB;  Service: Cardiovascular;  Laterality: N/A;   BICEPT TENODESIS Right 05/24/2020   Procedure: BICEPS TENODESIS;  Surgeon: Meredith Pel, MD;  Location: [place];  Service: Orthopedics;  Laterality: Right;   BIOPSY THYROID  1996   CARDIOVERSION N/A 12/07/2013   Procedure: CARDIOVERSION;  Surgeon: Casandra Doffing, MD;  Location: [facility] ENDOSCOPY;  Service: Cardiovascular;  Laterality: N/A;   CARDIOVERSION N/A 03/10/2016   Procedure: CARDIOVERSION;  Surgeon: Thayer Headings, MD;  Location: [facility] ENDOSCOPY;  Service: Cardiovascular;  Laterality: N/A;   CORONARY ATHERECTOMY N/A 09/18/2022   Procedure: CORONARY ATHERECTOMY;  Surgeon: Sherren Mocha, MD;  Location: [place] CV LAB;  Service: Cardiovascular;  Laterality: N/A;   CORONARY BALLOON ANGIOPLASTY N/A 06/24/2022   Procedure: CORONARY BALLOON ANGIOPLASTY;  Surgeon: Belva Crome, MD;  Location: [place] CV LAB;  Service: Cardiovascular;  Laterality: N/A;   CORONARY STENT INTERVENTION N/A 09/18/2022   Procedure: CORONARY STENT INTERVENTION;  Surgeon: Sherren Mocha, MD;  Location: [place] CV LAB;  Service: Cardiovascular;  Laterality: N/A;   ESOPHAGOGASTRODUODENOSCOPY N/A 12/07/2017   Procedure: ESOPHAGOGASTRODUODENOSCOPY (EGD);  Surgeon: Irene Shipper, MD;  Location: [facility] ENDOSCOPY;  Service: Endoscopy;  Laterality: N/A;   EYE MUSCLE SURGERY Right ~ 1966   LEFT HEART CATH AND CORONARY ANGIOGRAPHY N/A 06/24/2022   Procedure: LEFT HEART CATH AND CORONARY ANGIOGRAPHY;  Surgeon: Belva Crome, MD;  Location: [place] CV LAB;  Service: Cardiovascular;  Laterality: N/A;   SHOULDER ARTHROSCOPY WITH ROTATOR CUFF REPAIR Right 05/24/2020   Procedure: right shoulder arthroscopy, rotator cuff tear repair biceps tenodesis;  Surgeon: Meredith Pel, MD;  Location: [place];  Service: Orthopedics;  Laterality: Right;   TEE WITHOUT CARDIOVERSION N/A  06/29/2017   Procedure: TRANSESOPHAGEAL ECHOCARDIOGRAM (TEE);  Surgeon: Fay Records, MD;  Location: [place];  Service:  Cardiovascular;  Laterality: N/A;   TONSILLECTOMY  1971    ROS: Review of Systems Negative except as stated above  PHYSICAL EXAM: BP 123/81 (BP Location: Left Arm, Patient Position: Sitting, Cuff Size: Large)   Pulse 81   Temp 98.2 F (36.8 C) (Oral)   Ht 5' 5"$  (1.651 m)   Wt 233 lb (105.7 kg)   LMP 04/15/2015 (LMP Unknown) Comment: last in may  SpO2 98%   BMI 38.77 kg/m   Wt Readings from Last 3 Encounters:  01/09/23 233 lb (105.7 kg)  12/19/22 234 lb 12.8 oz (106.5 kg)  10/15/22 234 lb 6.4 oz (106.3 kg)    Physical Exam  General appearance - alert, well appearing, and in no distress Mental status - normal mood, behavior, speech, dress, motor activity, and thought processes Ears - bilateral TM's and external ear canals normal Nose -mild enlargement of nasal turbinates Mouth - mucous membranes moist, pharynx normal without lesions Neck - supple, no significant adenopathy Chest - clear to auscultation, no wheezes, rales or rhonchi, symmetric air entry Heart - RRR, no gallops Extremities - no LE edema      Latest Ref Rng & Units 10/09/2022    8:29 AM 09/19/2022    1:59 AM 09/09/2022   10:20 AM  CMP  Glucose 70 - 99 mg/dL 144  111  171   BUN 6 - 24 mg/dL 10  6  12   $ Creatinine 0.57 - 1.00 mg/dL 0.69  0.63  0.69   Sodium 134 - 144 mmol/L 140  137  139   Potassium 3.5 - 5.2 mmol/L 4.6  3.9  4.2   Chloride 96 - 106 mmol/L 102  107  103   CO2 20 - 29 mmol/L 25  23  23   $ Calcium 8.7 - 10.2 mg/dL 9.1  8.5  8.9   Total Protein 6.0 - 8.5 g/dL 6.6     Total Bilirubin 0.0 - 1.2 mg/dL 0.4     Alkaline Phos 44 - 121 IU/L 76     AST 0 - 40 IU/L 11     ALT 0 - 32 IU/L 21      Lipid Panel     Component Value Date/Time   CHOL 122 10/09/2022 0829   TRIG 118 10/09/2022 0829   HDL 36 (L) 10/09/2022 0829   CHOLHDL 3.4 10/09/2022 0829   CHOLHDL 6.8  06/25/2022 0514   VLDL 61 (H) 06/25/2022 0514   LDLCALC 65 10/09/2022 0829   LDLDIRECT 114.0 02/13/2015 0750    CBC    Component Value Date/Time   WBC 6.5 10/09/2022 0829   WBC 6.0 09/19/2022 0159   RBC 4.00 10/09/2022 0829   RBC 3.78 (L) 09/19/2022 0159   HGB 12.8 10/09/2022 0829   HCT 38.9 10/09/2022 0829   PLT 265 10/09/2022 0829   MCV 97 10/09/2022 0829   MCH 32.0 10/09/2022 0829   MCH 31.7 09/19/2022 0159   MCHC 32.9 10/09/2022 0829   MCHC 33.1 09/19/2022 0159   RDW 12.5 10/09/2022 0829   LYMPHSABS 2.0 06/22/2022 1504   LYMPHSABS 1.7 11/25/2017 1017   MONOABS 0.7 06/22/2022 1504   EOSABS 0.3 06/22/2022 1504   EOSABS 0.3 11/25/2017 1017   BASOSABS 0.1 06/22/2022 1504   BASOSABS 0.1 11/25/2017 1017    ASSESSMENT AND PLAN:  1. 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(HCC) A1c not at goal. 10 years to encourage healthy eating habits and trying to move is much as she can. Recommend increasing  glipizide to 10 mg daily until she sees Dr. Dorris Fetch later this mth.  Advised of signs and symptoms of hypoglycemia and how to treat. Continue Semglee 80 units daily, Ozempic 2 mg weekly,  and metformin 1 g twice a day. - POCT glucose (manual entry) - POCT glycosylated hemoglobin (Hb A1C)  2. Hypertension associated with diabetes ([place]) Close to goal.  Continue current medications including isosorbide 30 mg daily, lisinopril 10 mg daily, metoprolol 75 mg daily  3. Chronic systolic congestive heart failure (HCC) Stable and compensated. Continue low-salt diet. 4. Coronary artery disease involving native coronary artery of native heart without angina pectoris Stable.  Continue atorvastatin 80 mg daily, isosorbide 30 mg daily, metoprolol 75 mg daily and Plavix  5. Medication side effect Stop Zyrtec.  Changed to Claritin instead  6. Encounter for screening mammogram for malignant neoplasm of breast - MM Digital Screening; Future    Patient was given the opportunity to ask questions.   Patient verbalized understanding of the plan and was able to repeat key elements of the plan.   This documentation was completed using Radio producer.  Any transcriptional errors are unintentional.  Orders Placed This Encounter  Procedures   MM Digital Screening   POCT glucose (manual entry)   POCT glycosylated hemoglobin (Hb A1C)     Requested Prescriptions   Signed Prescriptions Disp Refills   loratadine (CLARITIN) 10 MG tablet 30 tablet 0    Sig: Take 1 tablet (10 mg total) by mouth daily.    Return in about 5 weeks (around 02/13/2023) for PAP.  Karle Plumber, MD, FACP

## 2023-01-09 NOTE — Patient Instructions (Signed)
Start taking Glipizide XL 10 mg daily until you see Dr. Dorris Fetch later this month.  If you develop any low blood sugars with this increase, decrease back to once a day.

## 2023-01-15 ENCOUNTER — Ambulatory Visit: Payer: Self-pay | Admitting: "Endocrinology

## 2023-01-19 ENCOUNTER — Ambulatory Visit
Admission: EM | Admit: 2023-01-19 | Discharge: 2023-01-19 | Disposition: A | Discharge status: Home / Self Care | Payer: BC Managed Care – PPO | Attending: Nurse Practitioner | Admitting: Nurse Practitioner

## 2023-01-19 DIAGNOSIS — Z794 Long term (current) use of insulin: Secondary | ICD-10-CM | POA: Diagnosis not present

## 2023-01-19 DIAGNOSIS — J069 Acute upper respiratory infection, unspecified: Secondary | ICD-10-CM | POA: Insufficient documentation

## 2023-01-19 DIAGNOSIS — R0602 Shortness of breath: Secondary | ICD-10-CM | POA: Insufficient documentation

## 2023-01-19 DIAGNOSIS — E119 Type 2 diabetes mellitus without complications: Secondary | ICD-10-CM | POA: Insufficient documentation

## 2023-01-19 DIAGNOSIS — Z7984 Long term (current) use of oral hypoglycemic drugs: Secondary | ICD-10-CM | POA: Diagnosis not present

## 2023-01-19 DIAGNOSIS — Z79899 Other long term (current) drug therapy: Secondary | ICD-10-CM | POA: Insufficient documentation

## 2023-01-19 DIAGNOSIS — Z1152 Encounter for screening for COVID-19: Secondary | ICD-10-CM | POA: Diagnosis not present

## 2023-01-19 DIAGNOSIS — Z7902 Long term (current) use of antithrombotics/antiplatelets: Secondary | ICD-10-CM | POA: Diagnosis not present

## 2023-01-19 DIAGNOSIS — Z87891 Personal history of nicotine dependence: Secondary | ICD-10-CM | POA: Diagnosis not present

## 2023-01-19 DIAGNOSIS — Z7951 Long term (current) use of inhaled steroids: Secondary | ICD-10-CM | POA: Diagnosis not present

## 2023-01-19 DIAGNOSIS — R058 Other specified cough: Secondary | ICD-10-CM | POA: Insufficient documentation

## 2023-01-19 MED ORDER — IPRATROPIUM BROMIDE 0.03 % NA SOLN: 2.0000 | Freq: Two times a day (BID) | NASAL | 12 refills | Status: DC

## 2023-01-19 MED ORDER — PROMETHAZINE-DM 6.25-15 MG/5ML PO SYRP: 5.0000 mL | ORAL_SOLUTION | Freq: Four times a day (QID) | ORAL | 0 refills | Status: DC | PRN

## 2023-01-19 MED ORDER — ALBUTEROL SULFATE HFA 108 (90 BASE) MCG/ACT IN AERS: 1.0000 | INHALATION_SPRAY | Freq: Four times a day (QID) | RESPIRATORY_TRACT | 0 refills | Status: DC | PRN

## 2023-01-19 NOTE — ED Provider Notes (Signed)
[facility]    CSN: [id number] Arrival date & time: 01/19/23  1303      History   Chief Complaint Chief Complaint  Patient presents with   Sore Throat   Nasal Congestion    HPI Kelly Lang is a 62 y.o. female  presents for evaluation of URI symptoms for 4 days. Patient reports associated symptoms of dry cough, congestion, sore throat, ear pain, shortness of breath with coughing. Denies N/V/D, body aches. Patient does not have a hx of asthma.  She is a previous smoker.  No known sick contacts.  Pt has taken Sinex OTC for symptoms. Pt has no other concerns at this time.    Sore Throat    Past Medical History:  Diagnosis Date   Arthritis    "all over" (06/30/2017)   Chicken pox    Dyslipidemia    Dysrhythmia ablation 2018   a-fib   GERD (gastroesophageal reflux disease)    H/O: hypothyroidism    a. thyroid biopsy 1996 with synthroid. Stopped taking rx and was loss to follow up b. normal thyroid function 10/20/13   High cholesterol    Hx of cardiovascular stress test    ETT/Lexiscan Myoview (12/2013):  No ischemia; not gated; low risk.   Hypertension    Obesity    Persistent atrial fibrillation ([place])    a diagnosed 10/25/2013   Psoriasis    on Skyrizi   Seasonal allergies    Stroke [facility]) 2016   "some speech problems since" (06/30/2017)   Tachycardia induced cardiomyopathy (HCC)    a. 2/2 Afib with RVR for unknown duration (EF: 40-45%)   Thyroid disease    Type II diabetes mellitus ([place])    a. hg A1c 10, newly diagnosed (10/26/13)    Patient Active Problem List   Diagnosis Date Noted   PAF (paroxysmal atrial fibrillation) ([place])    Exertional angina 09/18/2022   Class 2 obesity 06/25/2022   NSTEMI (non-ST elevated myocardial infarction) ([place])    Thyroid nodule 06/22/2022   Trigger ring finger of left hand 02/05/2021   Trigger little finger of right hand 02/05/2021   Mixed hyperlipidemia 09/17/2020   Multinodular goiter 09/16/2019   Chronic pain  disorder 08/09/2018   Diabetic polyneuropathy associated with type 2 diabetes mellitus ([place]) 08/09/2018   GI bleed 12/06/2017   Hepatic steatosis 12/04/2017   Cerebrovascular accident (CVA) due to embolism of left middle cerebral artery ([place])    Low back pain 04/03/2014   Vitamin D deficiency 01/20/2014   Essential hypertension, benign 01/20/2014   H/O: hypothyroidism    Class 2 severe obesity due to excess calories with serious comorbidity and body mass index (BMI) of 37.0 to 37.9 in adult [facility])    DM type 2 causing vascular disease ([place])    Arthritis    Atrial fibrillation  10/25/2013    Past Surgical History:  Procedure Laterality Date   ATRIAL FIBRILLATION ABLATION  06/30/2017   ATRIAL FIBRILLATION ABLATION N/A 06/30/2017   Procedure: Atrial Fibrillation Ablation;  Surgeon: Thompson Grayer, MD;  Location: [place] CV LAB;  Service: Cardiovascular;  Laterality: N/A;   BICEPT TENODESIS Right 05/24/2020   Procedure: BICEPS TENODESIS;  Surgeon: Meredith Pel, MD;  Location: [place];  Service: Orthopedics;  Laterality: Right;   BIOPSY THYROID  1996   CARDIOVERSION N/A 12/07/2013   Procedure: CARDIOVERSION;  Surgeon: Casandra Doffing, MD;  Location: [facility] ENDOSCOPY;  Service: Cardiovascular;  Laterality: N/A;   CARDIOVERSION N/A 03/10/2016  Procedure: CARDIOVERSION;  Surgeon: Thayer Headings, MD;  Location: [facility] ENDOSCOPY;  Service: Cardiovascular;  Laterality: N/A;   CORONARY ATHERECTOMY N/A 09/18/2022   Procedure: CORONARY ATHERECTOMY;  Surgeon: Sherren Mocha, MD;  Location: [place] CV LAB;  Service: Cardiovascular;  Laterality: N/A;   CORONARY BALLOON ANGIOPLASTY N/A 06/24/2022   Procedure: CORONARY BALLOON ANGIOPLASTY;  Surgeon: Belva Crome, MD;  Location: [place] [facility];  Service: Cardiovascular;  Laterality: N/A;   CORONARY STENT INTERVENTION N/A 09/18/2022   Procedure: CORONARY STENT INTERVENTION;  Surgeon: Sherren Mocha, MD;  Location: [place] CV  LAB;  Service: Cardiovascular;  Laterality: N/A;   ESOPHAGOGASTRODUODENOSCOPY N/A 12/07/2017   Procedure: ESOPHAGOGASTRODUODENOSCOPY (EGD);  Surgeon: Irene Shipper, MD;  Location: [facility] ENDOSCOPY;  Service: Endoscopy;  Laterality: N/A;   EYE MUSCLE SURGERY Right ~ 1966   LEFT HEART CATH AND CORONARY ANGIOGRAPHY N/A 06/24/2022   Procedure: LEFT HEART CATH AND CORONARY ANGIOGRAPHY;  Surgeon: Belva Crome, MD;  Location: [place] [facility];  Service: Cardiovascular;  Laterality: N/A;   SHOULDER ARTHROSCOPY WITH ROTATOR CUFF REPAIR Right 05/24/2020   Procedure: right shoulder arthroscopy, rotator cuff tear repair biceps tenodesis;  Surgeon: Meredith Pel, MD;  Location: [place];  Service: Orthopedics;  Laterality: Right;   TEE WITHOUT CARDIOVERSION N/A 06/29/2017   Procedure: TRANSESOPHAGEAL ECHOCARDIOGRAM (TEE);  Surgeon: Fay Records, MD;  Location: [facility] ENDOSCOPY;  Service: Cardiovascular;  Laterality: N/A;   TONSILLECTOMY  1971    OB History     Gravida  0   Para  0   Term  0   Preterm  0   AB  0   Living  0      SAB  0   IAB  0   Ectopic  0   Multiple  0   Live Births  0            Home Medications    Prior to Admission medications   Medication Sig Start Date End Date Taking? Authorizing Provider  albuterol (VENTOLIN HFA) 108 (90 Base) MCG/ACT inhaler Inhale 1-2 puffs into the lungs every 6 (six) hours as needed for wheezing or shortness of breath. 01/19/23  Yes Melynda Ripple, NP  ipratropium (ATROVENT) 0.03 % nasal spray Place 2 sprays into both nostrils every 12 (twelve) hours. 01/19/23  Yes Melynda Ripple, NP  promethazine-dextromethorphan (PROMETHAZINE-DM) 6.25-15 MG/5ML syrup Take 5 mLs by mouth 4 (four) times daily as needed for cough. 01/19/23  Yes Melynda Ripple, NP  acetaminophen (TYLENOL) 500 MG tablet Take 1,000 mg by mouth every 6 (six) hours as needed for headache.    [provider]  atorvastatin (LIPITOR) 80 MG tablet Take 1  tablet (80 mg total) by mouth daily. 07/18/22   Nahser, Wonda Cheng, MD  clopidogrel (PLAVIX) 75 MG tablet Take 1 tablet (75 mg total) by mouth daily with breakfast. 07/18/22   Nahser, Wonda Cheng, MD  Continuous Blood Gluc Receiver (DEXCOM G7 RECEIVER) DEVI Use to monitor BG continuously Patient not taking: Reported on 01/09/2023 03/12/22   Cassandria Anger, MD  Continuous Blood Gluc Sensor (DEXCOM G7 SENSOR) MISC CHANGE SENSOR EVERY 10 DAYS Patient not taking: Reported on 01/09/2023 12/11/22   Cassandria Anger, MD  diclofenac Sodium (VOLTAREN) 1 % GEL Apply 2 g topically 4 (four) times daily. 09/08/22   Ladell Pier, MD  DULoxetine (CYMBALTA) 30 MG capsule Take 1 capsule by mouth once daily 12/09/22   Karle Plumber  B, MD  fluticasone (FLONASE) 50 MCG/ACT nasal spray Place 2 sprays into both nostrils daily. 01/07/23   Jaynee Eagles, PA-C  glipiZIDE (GLUCOTROL XL) 5 MG 24 hr tablet Take 1 tablet by mouth once daily with breakfast 12/05/22   Cassandria Anger, MD  insulin glargine-yfgn [facility], YFGN,) 100 UNIT/ML Pen INJECT 80 UNITS  SUBCUTANEOUSLY AT BEDTIME 12/18/22   Cassandria Anger, MD  isosorbide mononitrate (IMDUR) 30 MG 24 hr tablet Take 1 tablet (30 mg total) by mouth daily. 07/18/22   Nahser, Wonda Cheng, MD  lisinopril (ZESTRIL) 10 MG tablet Take 1 tablet by mouth once daily 12/23/22   Ladell Pier, MD  loratadine (CLARITIN) 10 MG tablet Take 1 tablet (10 mg total) by mouth daily. 01/09/23   Ladell Pier, MD  metFORMIN (GLUCOPHAGE) 1000 MG tablet Take 1 tablet (1,000 mg total) by mouth 2 (two) times daily with a meal. 09/08/22   Ladell Pier, MD  metoCLOPramide (REGLAN) 10 MG tablet Take 1 tablet (10 mg total) by mouth every 6 (six) hours. 04/29/22   Wilnette Kales, PA  metoprolol succinate (TOPROL-XL) 25 MG 24 hr tablet Take 3 tablets (75 mg total) by mouth daily. 07/18/22   Nahser, Wonda Cheng, MD  [facility] DELICA LANCETS [id number] MISC Use as instructed to check blood sugar up to  3 times daily. 10/05/18   Ladell Pier, MD  ONETOUCH VERIO test strip USE STRIPS TO CHECK GLUCOSE UP TO THREE TIMES DAILY AS DIRECTED 12/14/20   Ladell Pier, MD  pantoprazole (PROTONIX) 40 MG tablet Take 1 tablet (40 mg total) by mouth daily. 06/26/22   Arrien, Jimmy Picket, MD  rivaroxaban (XARELTO) 20 MG TABS tablet Take 1 tablet (20 mg total) by mouth daily with supper. 07/18/22   Nahser, Wonda Cheng, MD  Semaglutide, 2 MG/DOSE, (OZEMPIC, 2 MG/DOSE,) 8 MG/3ML SOPN INJECT 2MG INTO THE SKIN ONCE A WEEK 01/05/23   Cassandria Anger, MD  SKYRIZI PEN 150 MG/ML SOAJ Inject 150 mg into the skin every 3 (three) months. 06/18/22   [provider]  TRUEPLUS PEN NEEDLES 32G X 4 MM MISC USE AS DIRECTED AT BEDTIME. 08/23/20   Ladell Pier, MD    Family History Family History  Adopted: Yes  Problem Relation Age of Onset   Hypertension Mother    Hyperlipidemia Mother     Social History Social History   Tobacco Use   Smoking status: Former    Packs/day: 1.00    Years: 22.00    Total pack years: 22.00    Types: Cigarettes    Quit date: 12/01/2010    Years since quitting: 12.1   Smokeless tobacco: Never  Vaping Use   Vaping Use: Never used  Substance Use Topics   Alcohol use: Yes    Comment: social   Drug use: No     Allergies   Jardiance [empagliflozin] and Codeine   Review of Systems Review of Systems  HENT:  Positive for congestion, ear pain and sore throat.   Respiratory:  Positive for cough.        Shortness of breath with coughing     Physical Exam Triage Vital Signs ED Triage Vitals  Enc Vitals Group     BP 01/19/23 1344 129/82     Pulse Rate 01/19/23 1344 86     Resp 01/19/23 1344 20     Temp 01/19/23 1344 98.3 F (36.8 C)     Temp Source 01/19/23 1344 Oral  SpO2 01/19/23 1344 94 %     Weight --      Height --      Head Circumference --      Peak Flow --      Pain Score 01/19/23 1343 5     Pain Loc --      Pain Edu? --      Excl.  in [place]? --    No data found.  Updated Vital Signs BP 129/82 (BP Location: Right Arm)   Pulse 86   Temp 98.3 F (36.8 C) (Oral)   Resp 20   LMP 04/15/2015 (LMP Unknown) Comment: last in may  SpO2 94%   Visual Acuity Right Eye Distance:   Left Eye Distance:   Bilateral Distance:    Right Eye Near:   Left Eye Near:    Bilateral Near:     Physical Exam Vitals and nursing note reviewed.  Constitutional:      General: She is not in acute distress.    Appearance: She is well-developed. She is not ill-appearing.  HENT:     Head: Normocephalic and atraumatic.     Right Ear: Tympanic membrane and ear canal normal.     Left Ear: Tympanic membrane and ear canal normal.     Nose: Congestion present.     Mouth/Throat:     Mouth: Mucous membranes are moist.     Pharynx: Oropharynx is clear. Uvula midline. Posterior oropharyngeal erythema present.     Tonsils: No tonsillar exudate or tonsillar abscesses.  Eyes:     Conjunctiva/sclera: Conjunctivae normal.     Pupils: Pupils are equal, round, and reactive to light.  Cardiovascular:     Rate and Rhythm: Normal rate and regular rhythm.     Heart sounds: Normal heart sounds.  Pulmonary:     Effort: Pulmonary effort is normal.     Breath sounds: Normal breath sounds.  Musculoskeletal:     Cervical back: Normal range of motion and neck supple.  Lymphadenopathy:     Cervical: No cervical adenopathy.  Skin:    General: Skin is warm and dry.  Neurological:     General: No focal deficit present.     Mental Status: She is alert and oriented to person, place, and time.  Psychiatric:        Mood and Affect: Mood normal.        Behavior: Behavior normal.      UC Treatments / Results  Labs (all labs ordered are listed, but only abnormal results are displayed) Labs Reviewed  SARS CORONAVIRUS 2 (TAT 6-24 HRS)    EKG   Radiology No results found.  Procedures Procedures (including critical care time)  Medications Ordered in  UC Medications - No data to display  Initial Impression / Assessment and Plan / UC Course  I have reviewed the triage vital signs and the nursing notes.  Pertinent labs & imaging results that were available during my care of the patient were reviewed by me and considered in my medical decision making (see chart for details).     Reviewed exam and symptoms with patient.  No red flags on exam COVID PCR and will contact if positive Atrovent nasal spray twice daily Albuterol inhaler as needed for shortness of breath with coughing Promethazine DM for cough.  Side effect profile reviewed Nasal rinses as tolerated Rest and fluids PCP follow-up 2 to 3 days for recheck ER precautions reviewed and patient verbalized understanding Final Clinical Impressions(s) / UC  Diagnoses   Final diagnoses:  Acute upper respiratory infection     Discharge Instructions      Atrovent nasal spray twice daily Promethazine DM as needed for cough.  Please note this medication can make you drowsy.  Do not drink alcohol or drive on the medication Albuterol inhaler as needed for shortness of breath Follow-up with your PCP in 2 days for recheck Please go to the ER for any worsening symptoms    ED Prescriptions     Medication Sig Dispense Auth. Provider   ipratropium (ATROVENT) 0.03 % nasal spray Place 2 sprays into both nostrils every 12 (twelve) hours. 30 mL Melynda Ripple, NP   promethazine-dextromethorphan (PROMETHAZINE-DM) 6.25-15 MG/5ML syrup Take 5 mLs by mouth 4 (four) times daily as needed for cough. 118 mL Melynda Ripple, NP   albuterol (VENTOLIN HFA) 108 (90 Base) MCG/ACT inhaler Inhale 1-2 puffs into the lungs every 6 (six) hours as needed for wheezing or shortness of breath. 1 each Melynda Ripple, NP      PDMP not reviewed this encounter.   Melynda Ripple, NP 01/19/23 1430

## 2023-01-19 NOTE — Discharge Instructions (Signed)
Atrovent nasal spray twice daily Promethazine DM as needed for cough.  Please note this medication can make you drowsy.  Do not drink alcohol or drive on the medication Albuterol inhaler as needed for shortness of breath Follow-up with your PCP in 2 days for recheck Please go to the ER for any worsening symptoms

## 2023-01-19 NOTE — ED Triage Notes (Signed)
Pt c/o sore throat and congestion, since Thursday.  Home interventions: mucinex

## 2023-01-20 LAB — SARS CORONAVIRUS 2 (TAT 6-24 HRS): SARS Coronavirus 2: NEGATIVE

## 2023-01-23 ENCOUNTER — Ambulatory Visit: Payer: Self-pay | Admitting: "Endocrinology

## 2023-02-02 ENCOUNTER — Other Ambulatory Visit: Payer: Self-pay | Admitting: Internal Medicine

## 2023-02-03 NOTE — Telephone Encounter (Signed)
Requested Prescriptions  Pending Prescriptions Disp Refills   loratadine (CLARITIN) 10 MG tablet [Pharmacy Med Name: Loratadine 10 MG Oral Tablet] 90 tablet 3    Sig: Take 1 tablet by mouth once daily     Ear, Nose, and Throat:  Antihistamines 2 Passed - 02/02/2023  9:28 AM      Passed - Cr in normal range and within 360 days    Creat  Date Value Ref Range Status  06/16/2016 0.90 0.50 - 1.05 mg/dL Final    Comment:      For patients > or = 60 years of age: The upper reference limit for Creatinine is approximately 13% higher for people identified as African-American.      Creatinine, Ser  Date Value Ref Range Status  10/09/2022 0.69 0.57 - 1.00 mg/dL Final   Creatinine, POC  Date Value Ref Range Status  09/24/2020 300 mg/dL Final         Passed - Valid encounter within last 12 months    Recent Outpatient Visits           3 weeks ago 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([place])   [place] Karle Plumber B, MD   4 months ago 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[facility])   [place] Karle Plumber B, MD   9 months ago 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[facility])   [place], [place], [place]   11 months ago [place], [place] W, NP   1 year ago 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[facility])   [place], MD       Future Appointments             In 6 days Ladell Pier, MD [place]   In 1 month [place], Lanice Schwab, NP [place] [facility] [place]

## 2023-02-09 ENCOUNTER — Other Ambulatory Visit (HOSPITAL_COMMUNITY)
Admission: RE | Admit: 2023-02-09 | Discharge: 2023-02-09 | Disposition: A | Discharge status: Home / Self Care | Payer: BC Managed Care – PPO | Source: Ambulatory Visit | Attending: Internal Medicine | Admitting: Internal Medicine

## 2023-02-09 ENCOUNTER — Ambulatory Visit: Payer: BC Managed Care – PPO | Attending: Internal Medicine | Admitting: Internal Medicine

## 2023-02-09 ENCOUNTER — Encounter: Payer: Self-pay | Admitting: Internal Medicine

## 2023-02-09 VITALS — BP 113/71 | HR 84 | Temp 98.0°F | Ht 65.0 in | Wt 234.0 lb

## 2023-02-09 DIAGNOSIS — Z124 Encounter for screening for malignant neoplasm of cervix: Secondary | ICD-10-CM | POA: Diagnosis not present

## 2023-02-09 NOTE — Progress Notes (Signed)
Patient ID: Kelly Lang, female    DOB: Sep 11, 1961  MRN: [id number]  CC: Gynecologic Exam (Pap. Neoma Laming flu vax. )   Subjective: Kelly Lang is a 62 y.o. 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    GYN History:  Pt is G2P0 (miscarriage, and one abortion) Any hx of abn paps?: no Menses regular or irregular?: NA How long does menses last?  Menstrual flow light or heavy?:  NA Method of birth control?:  NA Any vaginal dischg at this time?:  NO Dysuria?: NO Any hx of STI?:  no Sexually active with how many partners:  not active in the past 1 yr Desires STI screen: no Last MMG: scheduled for next mth Family hx of uterine, cervical or breast cancer?:  she was adopted.     Patient Active Problem List   Diagnosis Date Noted   PAF (paroxysmal atrial fibrillation) ([place])    Exertional angina 09/18/2022   Class 2 obesity 06/25/2022   NSTEMI (non-ST elevated myocardial infarction) ([place])    Thyroid nodule 06/22/2022   Trigger ring finger of left hand 02/05/2021   Trigger little finger of right hand 02/05/2021   Mixed hyperlipidemia 09/17/2020   Multinodular goiter 09/16/2019   Chronic pain disorder 08/09/2018   Diabetic polyneuropathy associated with type 2 diabetes mellitus ([place]) 08/09/2018   GI bleed 12/06/2017   Hepatic steatosis 12/04/2017   Cerebrovascular accident (CVA) due to embolism of left middle cerebral artery ([place])    Low back pain 04/03/2014   Vitamin D deficiency 01/20/2014   Essential hypertension, benign 01/20/2014   H/O: hypothyroidism    Class 2 severe obesity due to excess calories with serious comorbidity and body mass index (BMI) of 37.0 to 37.9 in adult [facility])    DM type 2 causing vascular  disease ([place])    Arthritis    Atrial fibrillation  10/25/2013     Current Outpatient Medications on File Prior to Visit  Medication Sig Dispense Refill   acetaminophen (TYLENOL) 500 MG tablet Take 1,000 mg by mouth every 6 (six) hours as needed for headache.     albuterol (VENTOLIN HFA) 108 (90 Base) MCG/ACT inhaler Inhale 1-2 puffs into the lungs every 6 (six) hours as needed for wheezing or shortness of breath. 1 each 0   atorvastatin (LIPITOR) 80 MG tablet Take 1 tablet (80 mg total) by mouth daily. 90 tablet 3   clopidogrel (PLAVIX) 75 MG tablet Take 1 tablet (75 mg total) by mouth daily with breakfast. 90 tablet 3   Continuous Blood Gluc Receiver (DEXCOM G7 RECEIVER) DEVI Use to monitor BG continuously 1 each 0   Continuous Blood Gluc Sensor (DEXCOM G7 SENSOR) MISC CHANGE SENSOR EVERY 10 DAYS 3 each 3   diclofenac Sodium (VOLTAREN) 1 % GEL Apply 2 g topically 4 (four) times daily. 100 g 2   DULoxetine (CYMBALTA) 30 MG capsule Take 1 capsule by mouth once daily 90 capsule 0   fluticasone (FLONASE) 50 MCG/ACT nasal spray Place 2 sprays into both nostrils daily. 16 g 0   glipiZIDE (GLUCOTROL XL) 5 MG 24 hr tablet Take 1 tablet by mouth once daily with breakfast 90 tablet 0   insulin glargine-yfgn (SEMGLEE, YFGN,) 100 UNIT/ML Pen INJECT 80 UNITS  SUBCUTANEOUSLY  AT BEDTIME 30 mL 1   isosorbide mononitrate (IMDUR) 30 MG 24 hr tablet Take 1 tablet (30 mg total) by mouth daily. 90 tablet 3   lisinopril (ZESTRIL) 10 MG tablet Take 1 tablet by mouth once daily 90 tablet 3   loratadine (CLARITIN) 10 MG tablet Take 1 tablet by mouth once daily 90 tablet 3   metFORMIN (GLUCOPHAGE) 1000 MG tablet Take 1 tablet (1,000 mg total) by mouth 2 (two) times daily with a meal. 180 tablet 0   metoCLOPramide (REGLAN) 10 MG tablet Take 1 tablet (10 mg total) by mouth every 6 (six) hours. 30 tablet 0   metoprolol succinate (TOPROL-XL) 25 MG 24 hr tablet Take 3 tablets (75 mg total) by mouth daily. 270 tablet 3    ONETOUCH DELICA LANCETS 99991111 MISC Use as instructed to check blood sugar up to 3 times daily. 100 each 11   ONETOUCH VERIO test strip USE STRIPS TO CHECK GLUCOSE UP TO THREE TIMES DAILY AS DIRECTED 100 each 6   pantoprazole (PROTONIX) 40 MG tablet Take 1 tablet (40 mg total) by mouth daily. 30 tablet 3   rivaroxaban (XARELTO) 20 MG TABS tablet Take 1 tablet (20 mg total) by mouth daily with supper. 90 tablet 3   Semaglutide, 2 MG/DOSE, (OZEMPIC, 2 MG/DOSE,) 8 MG/3ML SOPN INJECT '2MG'$  INTO THE SKIN ONCE A WEEK 9 mL 0   SKYRIZI PEN 150 MG/ML SOAJ Inject 150 mg into the skin every 3 (three) months.     TRUEPLUS PEN NEEDLES 32G X 4 MM MISC USE AS DIRECTED AT BEDTIME. 100 each 2   ipratropium (ATROVENT) 0.03 % nasal spray Place 2 sprays into both nostrils every 12 (twelve) hours. (Patient not taking: Reported on 02/09/2023) 30 mL 12   promethazine-dextromethorphan (PROMETHAZINE-DM) 6.25-15 MG/5ML syrup Take 5 mLs by mouth 4 (four) times daily as needed for cough. (Patient not taking: Reported on 02/09/2023) 118 mL 0   No current facility-administered medications on file prior to visit.    Allergies  Allergen Reactions   Jardiance [Empagliflozin] Other (See Comments)    Yeast infections   Codeine Itching and Rash    Social History   Socioeconomic History   Marital status: Divorced    Spouse name: Not on file   Number of children: 0   Years of education: 14   Highest education level: Not on file  Occupational History   Occupation: Chief Technology Officer  Tobacco Use   Smoking status: Former    Packs/day: 1.00    Years: 22.00    Total pack years: 22.00    Types: Cigarettes    Quit date: 12/01/2010    Years since quitting: 12.2   Smokeless tobacco: Never  Vaping Use   Vaping Use: Never used  Substance and Sexual Activity   Alcohol use: Yes    Comment: social   Drug use: No   Sexual activity: Not Currently  Other Topics Concern   Not on file  Social History Narrative   Moved to [place]  from [place] in 2002.     Fun: shop, sew, decorate   Denies any beliefs effecting healthcare   Social Determinants of Health   Financial Resource Strain: Not on file  Food Insecurity: No Food Insecurity (09/19/2022)   Hunger Vital Sign    Worried About Running Out of Food in the Last Year: Never true    Ran Out of Food in the Last Year: Never true  Transportation Needs: No Transportation Needs (09/19/2022)  PRAPARE - Hydrologist (Medical): No    Lack of Transportation (Non-Medical): No  Physical Activity: Not on file  Stress: Not on file  Social Connections: Not on file  Intimate Partner Violence: Not At Risk (09/19/2022)   Humiliation, Afraid, Rape, and Kick questionnaire    Fear of Current or Ex-Partner: No    Emotionally Abused: No    Physically Abused: No    Sexually Abused: No    Family History  Adopted: Yes  Problem Relation Age of Onset   Hypertension Mother    Hyperlipidemia Mother     Past Surgical History:  Procedure Laterality Date   ATRIAL FIBRILLATION ABLATION  06/30/2017   ATRIAL FIBRILLATION ABLATION N/A 06/30/2017   Procedure: Atrial Fibrillation Ablation;  Surgeon: Thompson Grayer, MD;  Location: [facility];  Service: Cardiovascular;  Laterality: N/A;   BICEPT TENODESIS Right 05/24/2020   Procedure: BICEPS TENODESIS;  Surgeon: Meredith Pel, MD;  Location: [facility];  Service: Orthopedics;  Laterality: Right;   BIOPSY THYROID  1996   CARDIOVERSION N/A 12/07/2013   Procedure: CARDIOVERSION;  Surgeon: Casandra Doffing, MD;  Location[facility] ENDOSCOPY;  Service: Cardiovascular;  Laterality: N/A;   CARDIOVERSION N/A 03/10/2016   Procedure: CARDIOVERSION;  Surgeon: Thayer Headings, MD;  Location: [facility] ENDOSCOPY;  Service: Cardiovascular;  Laterality: N/A;   CORONARY ATHERECTOMY N/A 09/18/2022   Procedure: CORONARY ATHERECTOMY;  Surgeon: Sherren Mocha, MD;  Location: [place] [facility];  Service: Cardiovascular;   Laterality: N/A;   CORONARY BALLOON ANGIOPLASTY N/A 06/24/2022   Procedure: CORONARY BALLOON ANGIOPLASTY;  Surgeon: Belva Crome, MD;  Location: [place] [facility];  Service: Cardiovascular;  Laterality: N/A;   CORONARY STENT INTERVENTION N/A 09/18/2022   Procedure: CORONARY STENT INTERVENTION;  Surgeon: Sherren Mocha, MD;  Location: [place] [facility];  Service: Cardiovascular;  Laterality: N/A;   ESOPHAGOGASTRODUODENOSCOPY N/A 12/07/2017   Procedure: ESOPHAGOGASTRODUODENOSCOPY (EGD);  Surgeon: Irene Shipper, MD;  Location[facility]COPY;  Service: Endoscopy;  Laterality: N/A;   EYE MUSCLE SURGERY Right ~ 1966   LEFT HEART CATH AND CORONARY ANGIOGRAPHY N/A 06/24/2022   Procedure: LEFT HEART CATH AND CORONARY ANGIOGRAPHY;  Surgeon: Belva Crome, MD;  Location: [place] [facility];  Service: Cardiovascular;  Laterality: N/A;   SHOULDER ARTHROSCOPY WITH ROTATOR CUFF REPAIR Right 05/24/2020   Procedure: right shoulder arthroscopy, rotator cuff tear repair biceps tenodesis;  Surgeon: Meredith Pel, MD;  Location: [place];  Service: Orthopedics;  Laterality: Right;   TEE WITHOUT CARDIOVERSION N/A 06/29/2017   Procedure: TRANSESOPHAGEAL ECHOCARDIOGRAM (TEE);  Surgeon: Fay Records, MD;  Location: [facility] ENDOSCOPY;  Service: Cardiovascular;  Laterality: N/A;   TONSILLECTOMY  1971    ROS: Review of Systems Negative except as stated above  PHYSICAL EXAM: BP 113/71 (BP Location: Left Arm, Patient Position: Sitting, Cuff Size: Large)   Pulse 84   Temp 98 F (36.7 C) (Oral)   Ht '5\' 5"'$  (1.651 m)   Wt 234 lb (106.1 kg)   LMP 04/15/2015 (LMP Unknown) Comment: last in may  SpO2 96%   BMI 38.94 kg/m   Physical Exam  General appearance - alert, well appearing, and in no distress Pelvic - CMA Clarissa present:  normal external genitalia, vulva, vagina, cervix, uterus and adnexa.  Cervical os was slightly stenosed.      Latest Ref Rng & Units 10/09/2022    8:29 AM 09/19/2022     1:59 AM 09/09/2022  10:20 AM  CMP  Glucose 70 - 99 mg/dL 144  111  171   BUN 6 - 24 mg/dL '10  6  12   '$ Creatinine 0.57 - 1.00 mg/dL 0.69  0.63  0.69   Sodium 134 - 144 mmol/L 140  137  139   Potassium 3.5 - 5.2 mmol/L 4.6  3.9  4.2   Chloride 96 - 106 mmol/L 102  107  103   CO2 20 - 29 mmol/L '25  23  23   '$ Calcium 8.7 - 10.2 mg/dL 9.1  8.5  8.9   Total Protein 6.0 - 8.5 g/dL 6.6     Total Bilirubin 0.0 - 1.2 mg/dL 0.4     Alkaline Phos 44 - 121 IU/L 76     AST 0 - 40 IU/L 11     ALT 0 - 32 IU/L 21      Lipid Panel     Component Value Date/Time   CHOL 122 10/09/2022 0829   TRIG 118 10/09/2022 0829   HDL 36 (L) 10/09/2022 0829   CHOLHDL 3.4 10/09/2022 0829   CHOLHDL 6.8 06/25/2022 0514   VLDL 61 (H) 06/25/2022 0514   LDLCALC 65 10/09/2022 0829   LDLDIRECT 114.0 02/13/2015 0750    CBC    Component Value Date/Time   WBC 6.5 10/09/2022 0829   WBC 6.0 09/19/2022 0159   RBC 4.00 10/09/2022 0829   RBC 3.78 (L) 09/19/2022 0159   HGB 12.8 10/09/2022 0829   HCT 38.9 10/09/2022 0829   PLT 265 10/09/2022 0829   MCV 97 10/09/2022 0829   MCH 32.0 10/09/2022 0829   MCH 31.7 09/19/2022 0159   MCHC 32.9 10/09/2022 0829   MCHC 33.1 09/19/2022 0159   RDW 12.5 10/09/2022 0829   LYMPHSABS 2.0 06/22/2022 1504   LYMPHSABS 1.7 11/25/2017 1017   MONOABS 0.7 06/22/2022 1504   EOSABS 0.3 06/22/2022 1504   EOSABS 0.3 11/25/2017 1017   BASOSABS 0.1 06/22/2022 1504   BASOSABS 0.1 11/25/2017 1017    ASSESSMENT AND PLAN:  1. Pap smear for cervical cancer screening - Cytology - PAP    Patient was given the opportunity to ask questions.  Patient verbalized understanding of the plan and was able to repeat key elements of the plan.   This documentation was completed using Radio producer.  Any transcriptional errors are unintentional.  No orders of the defined types were placed in this encounter.    Requested Prescriptions    No prescriptions requested or ordered  in this encounter    Return in about 4 months (around 06/11/2023).  Karle Plumber, MD, FACP

## 2023-02-11 LAB — CYTOLOGY - PAP
Comment: NEGATIVE
Diagnosis: NEGATIVE
High risk HPV: NEGATIVE

## 2023-02-24 ENCOUNTER — Other Ambulatory Visit: Payer: Self-pay | Admitting: "Endocrinology

## 2023-02-24 DIAGNOSIS — Z794 Long term (current) use of insulin: Secondary | ICD-10-CM

## 2023-03-04 ENCOUNTER — Encounter: Payer: Self-pay | Admitting: "Endocrinology

## 2023-03-04 ENCOUNTER — Ambulatory Visit (INDEPENDENT_AMBULATORY_CARE_PROVIDER_SITE_OTHER): Payer: BC Managed Care – PPO | Admitting: "Endocrinology

## 2023-03-04 VITALS — BP 100/58 | HR 60 | Ht 65.0 in | Wt 236.0 lb

## 2023-03-04 DIAGNOSIS — E1159 Type 2 diabetes mellitus with other circulatory complications: Secondary | ICD-10-CM | POA: Diagnosis not present

## 2023-03-04 DIAGNOSIS — E782 Mixed hyperlipidemia: Secondary | ICD-10-CM | POA: Diagnosis not present

## 2023-03-04 DIAGNOSIS — I10 Essential (primary) hypertension: Secondary | ICD-10-CM | POA: Diagnosis not present

## 2023-03-04 DIAGNOSIS — E559 Vitamin D deficiency, unspecified: Secondary | ICD-10-CM

## 2023-03-04 DIAGNOSIS — Z6837 Body mass index (BMI) 37.0-37.9, adult: Secondary | ICD-10-CM

## 2023-03-04 LAB — POCT GLYCOSYLATED HEMOGLOBIN (HGB A1C): HbA1c, POC (controlled diabetic range): 7.8 % — AB (ref 0.0–7.0)

## 2023-03-04 MED ORDER — TIRZEPATIDE 5 MG/0.5ML ~~LOC~~ SOAJ: 5.0000 mg | SUBCUTANEOUS | 2 refills | Status: DC

## 2023-03-04 NOTE — Patient Instructions (Signed)

## 2023-03-04 NOTE — Progress Notes (Signed)
03/04/2023, 8:15 PM  Endocrinology follow-up note   Subjective:    Patient ID: Kelly Lang, female    DOB: 21-Jan-1961.  Kelly Lang is being seen in follow-up after she was seen in consultation for management of currently uncontrolled symptomatic diabetes requested by  Ladell Pier, MD.   Past Medical History:  Diagnosis Date   Arthritis    "all over" (06/30/2017)   Chicken pox    Dyslipidemia    Dysrhythmia ablation 2018   a-fib   GERD (gastroesophageal reflux disease)    H/O: hypothyroidism    a. thyroid biopsy 1996 with synthroid. Stopped taking rx and was loss to follow up b. normal thyroid function 10/20/13   High cholesterol    Hx of cardiovascular stress test    ETT/Lexiscan Myoview (12/2013):  No ischemia; not gated; low risk.   Hypertension    Obesity    Persistent atrial fibrillation    a diagnosed 10/25/2013   Psoriasis    on Skyrizi   Seasonal allergies    Stroke 2016   "some speech problems since" (06/30/2017)   Tachycardia induced cardiomyopathy    a. 2/2 Afib with RVR for unknown duration (EF: 40-45%)   Thyroid disease    Type II diabetes mellitus    a. hg A1c 10, newly diagnosed (10/26/13)    Past Surgical History:  Procedure Laterality Date   ATRIAL FIBRILLATION ABLATION  06/30/2017   ATRIAL FIBRILLATION ABLATION N/A 06/30/2017   Procedure: Atrial Fibrillation Ablation;  Surgeon: Thompson Grayer, MD;  Location: [place] [facility];  Service: Cardiovascular;  Laterality: N/A;   BICEPT TENODESIS Right 05/24/2020   Procedure: BICEPS TENODESIS;  Surgeon: Meredith Pel, MD;  Location: [place];  Service: Orthopedics;  Laterality: Right;   BIOPSY THYROID  1996   CARDIOVERSION N/A 12/07/2013   Procedure: CARDIOVERSION;  Surgeon: Casandra Doffing, MD;  Location: [facility] ENDOSCOPY;  Service: Cardiovascular;  Laterality: N/A;   CARDIOVERSION N/A 03/10/2016    Procedure: CARDIOVERSION;  Surgeon: Thayer Headings, MD;  Location: [facility] ENDOSCOPY;  Service: Cardiovascular;  Laterality: N/A;   CORONARY ATHERECTOMY N/A 09/18/2022   Procedure: CORONARY ATHERECTOMY;  Surgeon: Sherren Mocha, MD;  Location: [place] [facility];  Service: Cardiovascular;  Laterality: N/A;   CORONARY BALLOON ANGIOPLASTY N/A 06/24/2022   Procedure: CORONARY BALLOON ANGIOPLASTY;  Surgeon: Belva Crome, MD;  Location: [place] [facility];  Service: Cardiovascular;  Laterality: N/A;   CORONARY STENT INTERVENTION N/A 09/18/2022   Procedure: CORONARY STENT INTERVENTION;  Surgeon: Sherren Mocha, MD;  Location: [place] [facility];  Service: Cardiovascular;  Laterality: N/A;   ESOPHAGOGASTRODUODENOSCOPY N/A 12/07/2017   Procedure: ESOPHAGOGASTRODUODENOSCOPY (EGD);  Surgeon: Irene Shipper, MD;  [facility];  Service: Endoscopy;  Laterality: N/A;   EYE MUSCLE SURGERY Right ~ 1966   LEFT HEART CATH AND CORONARY ANGIOGRAPHY N/A 06/24/2022   Procedure: LEFT HEART CATH AND CORONARY ANGIOGRAPHY;  Surgeon: Belva Crome, MD;  Location: [place] [facility];  Service: Cardiovascular;  Laterality: N/A;   SHOULDER ARTHROSCOPY WITH ROTATOR CUFF REPAIR Right 05/24/2020   Procedure: right shoulder arthroscopy, rotator cuff  tear repair biceps tenodesis;  Surgeon: Meredith Pel, MD;  Location: [place];  Service: Orthopedics;  Laterality: Right;   TEE WITHOUT CARDIOVERSION N/A 06/29/2017   Procedure: TRANSESOPHAGEAL ECHOCARDIOGRAM (TEE);  Surgeon: Fay Records, MD;  Location[facility] ENDOSCOPY;  Service: Cardiovascular;  Laterality: N/A;   TONSILLECTOMY  1971    Social History   Socioeconomic History   Marital status: Divorced    Spouse name: Not on file   Number of children: 0   Years of education: 14   Highest education level: Not on file  Occupational History   Occupation: Chief Technology Officer  Tobacco Use   Smoking status: Former    Packs/day: 1.00    Years: 22.00     Additional pack years: 0.00    Total pack years: 22.00    Types: Cigarettes    Quit date: 12/01/2010    Years since quitting: 12.2   Smokeless tobacco: Never  Vaping Use   Vaping Use: Never used  Substance and Sexual Activity   Alcohol use: Yes    Comment: social   Drug use: No   Sexual activity: Not Currently  Other Topics Concern   Not on file  Social History Narrative   Moved to [place] from [place] in 2002.     Fun: shop, sew, decorate   Denies any beliefs effecting healthcare   Social Determinants of Health   Financial Resource Strain: Not on file  Food Insecurity: No Food Insecurity (09/19/2022)   Hunger Vital Sign    Worried About Running Out of Food in the Last Year: Never true    Ran Out of Food in the Last Year: Never true  Transportation Needs: No Transportation Needs (09/19/2022)   PRAPARE - Hydrologist (Medical): No    Lack of Transportation (Non-Medical): No  Physical Activity: Not on file  Stress: Not on file  Social Connections: Not on file    Family History  Adopted: Yes  Problem Relation Age of Onset   Hypertension Mother    Hyperlipidemia Mother     Outpatient Encounter Medications as of 03/04/2023  Medication Sig   tirzepatide (MOUNJARO) 5 MG/0.5ML Pen Inject 5 mg into the skin once a week.   acetaminophen (TYLENOL) 500 MG tablet Take 1,000 mg by mouth every 6 (six) hours as needed for headache.   albuterol (VENTOLIN HFA) 108 (90 Base) MCG/ACT inhaler Inhale 1-2 puffs into the lungs every 6 (six) hours as needed for wheezing or shortness of breath.   atorvastatin (LIPITOR) 80 MG tablet Take 1 tablet (80 mg total) by mouth daily.   clopidogrel (PLAVIX) 75 MG tablet Take 1 tablet (75 mg total) by mouth daily with breakfast.   Continuous Blood Gluc Receiver (DEXCOM G7 RECEIVER) DEVI Use to monitor BG continuously   Continuous Blood Gluc Sensor (DEXCOM G7 SENSOR) MISC CHANGE SENSOR EVERY 10 DAYS   diclofenac Sodium  (VOLTAREN) 1 % GEL Apply 2 g topically 4 (four) times daily.   DULoxetine (CYMBALTA) 30 MG capsule Take 1 capsule by mouth once daily   fluticasone (FLONASE) 50 MCG/ACT nasal spray Place 2 sprays into both nostrils daily.   glipiZIDE (GLUCOTROL XL) 5 MG 24 hr tablet Take 1 tablet by mouth once daily with breakfast   ipratropium (ATROVENT) 0.03 % nasal spray Place 2 sprays into both nostrils every 12 (twelve) hours. (Patient not taking: Reported on 02/09/2023)   isosorbide mononitrate (IMDUR) 30 MG 24 hr tablet Take 1 tablet (  30 mg total) by mouth daily.   lisinopril (ZESTRIL) 10 MG tablet Take 1 tablet by mouth once daily   loratadine (CLARITIN) 10 MG tablet Take 1 tablet by mouth once daily   metFORMIN (GLUCOPHAGE) 1000 MG tablet Take 1 tablet (1,000 mg total) by mouth 2 (two) times daily with a meal.   metoCLOPramide (REGLAN) 10 MG tablet Take 1 tablet (10 mg total) by mouth every 6 (six) hours.   metoprolol succinate (TOPROL-XL) 25 MG 24 hr tablet Take 3 tablets (75 mg total) by mouth daily.   ONETOUCH DELICA LANCETS 99991111 MISC Use as instructed to check blood sugar up to 3 times daily.   ONETOUCH VERIO test strip USE STRIPS TO CHECK GLUCOSE UP TO THREE TIMES DAILY AS DIRECTED   pantoprazole (PROTONIX) 40 MG tablet Take 1 tablet (40 mg total) by mouth daily.   promethazine-dextromethorphan (PROMETHAZINE-DM) 6.25-15 MG/5ML syrup Take 5 mLs by mouth 4 (four) times daily as needed for cough. (Patient not taking: Reported on 02/09/2023)   rivaroxaban (XARELTO) 20 MG TABS tablet Take 1 tablet (20 mg total) by mouth daily with supper.   SEMGLEE, YFGN, 100 UNIT/ML Pen INJECT 80 UNITS SUBCUTANEOUSLY AT BEDTIME   SKYRIZI PEN 150 MG/ML SOAJ Inject 150 mg into the skin every 3 (three) months.   TRUEPLUS PEN NEEDLES 32G X 4 MM MISC USE AS DIRECTED AT BEDTIME.   [DISCONTINUED] Semaglutide, 2 MG/DOSE, (OZEMPIC, 2 MG/DOSE,) 8 MG/3ML SOPN INJECT 2MG  INTO THE SKIN ONCE A WEEK   No facility-administered encounter  medications on file as of 03/04/2023.    ALLERGIES: Allergies  Allergen Reactions   Jardiance [Empagliflozin] Other (See Comments)    Yeast infections   Codeine Itching and Rash    VACCINATION STATUS: Immunization History  Administered Date(s) Administered   Influenza,inj,Quad PF,6+ Mos 11/19/2016, 09/07/2017, 08/09/2018, 08/16/2020, 08/23/2021, 09/08/2022   Moderna Covid-19 Vaccine Bivalent Booster 72yrs & up 09/10/2022   Moderna Sars-Covid-2 Vaccination 01/04/2020, 02/01/2020, 10/12/2020, 06/09/2021   Pneumococcal Polysaccharide-23 06/16/2016   Tdap 06/16/2016   Zoster Recombinat (Shingrix) 07/14/2022, 10/13/2022    Diabetes She presents for her follow-up diabetic visit. She has type 2 diabetes mellitus. Onset time: She was diagnosed at approximate age of 66 years. Her disease course has been worsening. There are no hypoglycemic associated symptoms. Pertinent negatives for hypoglycemia include no confusion, headaches, pallor or seizures. Pertinent negatives for diabetes include no chest pain, no fatigue, no polydipsia, no polyphagia and no polyuria. There are no hypoglycemic complications. Symptoms are worsening. Diabetic complications include a CVA and heart disease. Risk factors for coronary artery disease include dyslipidemia, diabetes mellitus, obesity, hypertension, post-menopausal, sedentary lifestyle and tobacco exposure. Current diabetic treatment includes insulin injections (She is currently on Semglee 80 units nightly, Ozempic 2 mg weekly.  Glipizide 5 mg daily.  Metformin 1000 mg p.o. twice daily.). Her weight is fluctuating minimally. She is following a generally unhealthy diet. When asked about meal planning, she reported none. She has had a previous visit with a dietitian. She participates in exercise intermittently. Her home blood glucose trend is fluctuating minimally. Her breakfast blood glucose range is generally 180-200 mg/dl. Her lunch blood glucose range is generally  180-200 mg/dl. Her dinner blood glucose range is generally 180-200 mg/dl. Her bedtime blood glucose range is generally 180-200 mg/dl. Her overall blood glucose range is 180-200 mg/dl. Kelly Lang presents with above target glycemic profile.  Her Dexcom CGM shows 42% time in range, 48% level 1 hyperglycemia, 10% level 2 hyperglycemia.  Her point-of-care A1c  is 7.8% increasing from 7.4% during her last visit.  She did not document any hypoglycemia. Her most recent blood glucose averages 192 for the last 14 days.  ) An ACE inhibitor/angiotensin II receptor blocker is being taken. Eye exam is current.  Hypertension This is a chronic problem. The current episode started more than 1 year ago. The problem is controlled. Pertinent negatives include no chest pain, headaches, palpitations or shortness of breath. Risk factors for coronary artery disease include diabetes mellitus, dyslipidemia, obesity, sedentary lifestyle, smoking/tobacco exposure and post-menopausal state. Past treatments include ACE inhibitors and beta blockers. Hypertensive end-organ damage includes CVA.  Hyperlipidemia This is a chronic problem. The current episode started more than 1 year ago. The problem is uncontrolled. Exacerbating diseases include diabetes and obesity. Pertinent negatives include no chest pain, myalgias or shortness of breath. Risk factors for coronary artery disease include dyslipidemia, diabetes mellitus, hypertension, obesity, a sedentary lifestyle and post-menopausal.    Review of Systems  Constitutional:  Negative for chills, fatigue, fever and unexpected weight change.  HENT:  Negative for trouble swallowing and voice change.   Eyes:  Negative for visual disturbance.  Respiratory:  Negative for cough, shortness of breath and wheezing.   Cardiovascular:  Negative for chest pain, palpitations and leg swelling.  Gastrointestinal:  Negative for diarrhea, nausea and vomiting.  Endocrine: Negative for cold intolerance,  heat intolerance, polydipsia, polyphagia and polyuria.  Musculoskeletal:  Negative for arthralgias and myalgias.  Skin:  Negative for color change, pallor, rash and wound.  Neurological:  Negative for seizures and headaches.  Psychiatric/Behavioral:  Negative for confusion and suicidal ideas.     Objective:       03/04/2023    3:47 PM 02/09/2023    2:00 PM 01/19/2023    1:44 PM  Vitals with BMI  Height 5\' 5"  5\' 5"    Weight 236 lbs 234 lbs   BMI [id number]   Systolic [id number]  Diastolic 58 71 82  Pulse 60 84 86    BP (!) 100/58   Pulse 60   Ht 5\' 5"  (1.651 m)   Wt 236 lb (107 kg)   LMP 04/15/2015 (LMP Unknown) Comment: last in may  BMI 39.27 kg/m   Wt Readings from Last 3 Encounters:  03/04/23 236 lb (107 kg)  02/09/23 234 lb (106.1 kg)  01/09/23 233 lb (105.7 kg)     CMP     Component Value Date/Time   NA 140 10/09/2022 0829   K 4.6 10/09/2022 0829   CL 102 10/09/2022 0829   CO2 25 10/09/2022 0829   GLUCOSE 144 (H) 10/09/2022 0829   GLUCOSE 111 (H) 09/19/2022 0159   BUN 10 10/09/2022 0829   CREATININE 0.69 10/09/2022 0829   CREATININE 0.90 06/16/2016 1128   CALCIUM 9.1 10/09/2022 0829   PROT 6.6 10/09/2022 0829   ALBUMIN 3.9 10/09/2022 0829   AST 11 10/09/2022 0829   ALT 21 10/09/2022 0829   ALKPHOS 76 10/09/2022 0829   BILITOT 0.4 10/09/2022 0829   GFRNONAA >60 09/19/2022 0159   GFRNONAA 73 06/16/2016 1128   GFRAA 118 12/25/2020 0802   GFRAA 84 06/16/2016 1128     Diabetic Labs (most recent): Lab Results  Component Value Date   HGBA1C 7.8 (A) 03/04/2023   HGBA1C 7.5 (A) 01/09/2023   HGBA1C 7.4 (H) 09/18/2022   MICROALBUR 150 09/24/2020   MICROALBUR 6.2 12/26/2015   MICROALBUR 2.9 (H) 11/13/2014     Lipid Panel ( most recent) Lipid  Panel     Component Value Date/Time   CHOL 122 10/09/2022 0829   TRIG 118 10/09/2022 0829   HDL 36 (L) 10/09/2022 0829   CHOLHDL 3.4 10/09/2022 0829   CHOLHDL 6.8 06/25/2022 0514   VLDL 61 (H) 06/25/2022  0514   LDLCALC 65 10/09/2022 0829   LDLDIRECT 114.0 02/13/2015 0750   LABVLDL 21 10/09/2022 0829      Lab Results  Component Value Date   TSH 0.597 12/25/2020   TSH 1.01 09/16/2019   TSH 0.718 12/26/2015   TSH 0.950 09/15/2015   TSH 0.95 11/13/2014   TSH 1.020 10/25/2013   FREET4 0.99 12/25/2020   FREET4 0.98 12/26/2015   FREET4 0.98 10/25/2013      Assessment & Plan:   1. DM type 2 causing vascular disease (Kelly Lang) - Kelly Lang has currently uncontrolled symptomatic type 2 DM since  62 years of age.  Kelly Lang presents with above target glycemic profile.  Her Dexcom CGM shows 42% time in range, 48% level 1 hyperglycemia, 10% level 2 hyperglycemia.  Her point-of-care A1c is 7.8% increasing from 7.4% during her last visit.  She did not document any hypoglycemia. Her most recent blood glucose averages 192 for the last 14 days.   - I had a long discussion with her about the progressive nature of diabetes and the pathology behind its complications. -her diabetes is complicated by CVA, coronary artery disease now status post stent placement, peripheral neuropathy and she remains at a high risk for more acute and chronic complications which include CAD, CVA, CKD, retinopathy, and neuropathy. These are all discussed in detail with her.  - I have counseled her on diet  and weight management  by adopting a carbohydrate restricted/protein rich diet. Patient is encouraged to switch to  unprocessed or minimally processed     complex starch and increased protein intake (animal or plant source), fruits, and vegetables. -  she is advised to stick to a routine mealtimes to eat 3 meals  a day and avoid unnecessary snacks ( to snack only to correct hypoglycemia).  - she acknowledges that there is a room for improvement in her food and drink choices. - Suggestion is made for her to avoid simple carbohydrates  from her diet including Cakes, Sweet Desserts, Ice Cream, Soda (diet and regular), Sweet Tea,  Candies, Chips, Cookies, Store Bought Juices, Alcohol , Artificial Sweeteners,  Coffee Creamer, and "Sugar-free" Products, Lemonade. This will help patient to have more stable blood glucose profile and potentially avoid unintended weight gain.  The following Lifestyle Medicine recommendations according to [facility]) were discussed and and offered to patient and she  agrees to start the journey:  A. Whole Foods, Plant-Based Nutrition comprising of fruits and vegetables, plant-based proteins, whole-grain carbohydrates was discussed in detail with the patient.   A list for source of those nutrients were also provided to the patient.  Patient will use only water or unsweetened tea for hydration. B.  The need to stay away from risky substances including alcohol, smoking; obtaining 7 to 9 hours of restorative sleep, at least 150 minutes of moderate intensity exercise weekly, the importance of healthy social connections,  and stress management techniques were discussed. C.  A full color page of  Calorie density of various food groups per pound showing examples of each food groups was provided to the patient.   - she reports that she has had enough encounters with dietitian in the past.    -  I have approached her with the following individualized plan to manage  her diabetes and patient agrees:   -She has not engaged in lifestyle medicine optimally.  However, she has made significant changes and engaging better with her medication compliance.   She will not need prandial insulin.  -She is advised to use her CGM continuously.  She is advised to continue Semglee 80 units nightly, switch her Ozempic to Mounjaro 5 mg subcutaneously weekly.  Side effects and precautions discussed with her.   She does not tolerate SGLT2 inhibitors.  I discussed and advised her to stay on metformin 1000 mg p.o. twice daily and glipizide 5 mg XL p.o. daily at breakfast.   - she is encouraged  to call clinic for blood glucose levels less than 70 or above 200 mg /dl.   - Specific targets for  A1c;  LDL, HDL,  and Triglycerides were discussed with the patient.  2) Blood Pressure /Hypertension:  Her blood pressure is controlled to target.  She has a chance to minimize her blood pressure medications on subsequent visits.  The above recommended plant dominant whole food approach will address hypertension, hyperlipidemia as well.   she is advised to continue her current medications including lisinopril 10 mg p.o. daily, metoprolol 25 mg p.o. twice daily.   3) Lipids/Hyperlipidemia:   Review of her recent lipid panel showed improved LDL at 67.  She is advised to continue atorvastatin 40 mg p.o. nightly.   Her LDL has improved from 114.   Side effects and precautions discussed with her.     4)  Weight/Diet:  Body mass index is 39.27 kg/m.  -   clearly complicating her diabetes care.   she is  a candidate for weight loss. I discussed with her the fact that loss of 5 - 10% of her  current body weight will have the most impact on her diabetes management.  Exercise, and detailed carbohydrates information provided  -  detailed on discharge instructions.   5) Chronic Care/Health Maintenance:  -she  is on ACEI/ARB and Statin medications and  is encouraged to initiate and continue to follow up with Ophthalmology, Dentist,  Podiatrist at least yearly or according to recommendations, and advised to   stay away from smoking. I have recommended yearly flu vaccine and pneumonia vaccine at least every 5 years; moderate intensity exercise for up to 150 minutes weekly; and  sleep for at least 7 hours a day.  Her diabetes foot exam today is significant for partial  neuropathy.   - she is  advised to maintain close follow up with Ladell Pier, MD for primary care needs, as well as her other providers for optimal and coordinated care.  I spent  26  minutes in the care of the patient today including  review of labs from [place], Lipids, Thyroid Function, Hematology (current and previous including abstractions from other facilities); face-to-face time discussing  her blood glucose readings/logs, discussing hypoglycemia and hyperglycemia episodes and symptoms, medications doses, her options of short and long term treatment based on the latest standards of care / guidelines;  discussion about incorporating lifestyle medicine;  and documenting the encounter. Risk reduction counseling performed per USPSTF guidelines to reduce  obesity and cardiovascular risk factors.     Please refer to Patient Instructions for Blood Glucose Monitoring and Insulin/Medications Dosing Guide"  in media tab for additional information. Please  also refer to " Patient Self Inventory" in the Media  tab for reviewed elements of  pertinent patient history.  Kelly Lang participated in the discussions, expressed understanding, and voiced agreement with the above plans.  All questions were answered to her satisfaction. she is encouraged to contact clinic should she have any questions or concerns prior to her return visit.    Follow up plan: - Return in about 4 months (around 07/04/2023) for F/U with Pre-visit Labs, Meter/CGM/Logs, A1c here.  Kelly Lloyd, MD [facility] [address] [place], [place] [address] Phone: [phone]  Fax: [phone]    03/04/2023, 8:15 PM  This note was partially dictated with voice recognition software. Similar sounding words can be transcribed inadequately or may not  be corrected upon review.

## 2023-03-05 ENCOUNTER — Ambulatory Visit: Payer: BC Managed Care – PPO

## 2023-03-06 ENCOUNTER — Other Ambulatory Visit: Payer: Self-pay | Admitting: "Endocrinology

## 2023-03-06 ENCOUNTER — Other Ambulatory Visit: Payer: Self-pay | Admitting: Internal Medicine

## 2023-03-06 DIAGNOSIS — M2550 Pain in unspecified joint: Secondary | ICD-10-CM

## 2023-03-06 DIAGNOSIS — E1159 Type 2 diabetes mellitus with other circulatory complications: Secondary | ICD-10-CM

## 2023-03-09 NOTE — Telephone Encounter (Signed)
Requested Prescriptions  Pending Prescriptions Disp Refills   DULoxetine (CYMBALTA) 30 MG capsule [Pharmacy Med Name: DULoxetine HCl 30 MG Oral Capsule Delayed Release Particles] 90 capsule 1    Sig: Take 1 capsule by mouth once daily     Psychiatry: Antidepressants - SNRI - duloxetine Passed - 03/06/2023  9:21 PM      Passed - Cr in normal range and within 360 days    Creat  Date Value Ref Range Status  06/16/2016 0.90 0.50 - 1.05 mg/dL Final    Comment:      For patients > or = 60 years of age: The upper reference limit for Creatinine is approximately 13% higher for people identified as African-American.      Creatinine, Ser  Date Value Ref Range Status  10/09/2022 0.69 0.57 - 1.00 mg/dL Final   Creatinine, POC  Date Value Ref Range Status  09/24/2020 300 mg/dL Final         Passed - eGFR is 30 or above and within 360 days    GFR, Est African American  Date Value Ref Range Status  06/16/2016 84 >=60 mL/min Final   GFR calc Af Amer  Date Value Ref Range Status  12/25/2020 118 >59 mL/min/1.73 Final    Comment:    **In accordance with recommendations from the NKF-ASN Task force,**   [facility] is in the process of updating its eGFR calculation to the   2021 CKD-EPI creatinine equation that estimates kidney function   without a race variable.    GFR, Est Non African American  Date Value Ref Range Status  06/16/2016 73 >=60 mL/min Final   GFR, Estimated  Date Value Ref Range Status  09/19/2022 >60 >60 mL/min Final    Comment:    (NOTE) Calculated using the CKD-EPI Creatinine Equation (2021)    GFR  Date Value Ref Range Status  02/13/2015 107.47 >60.00 mL/min Final   eGFR  Date Value Ref Range Status  10/09/2022 100 >59 mL/min/1.73 Final         Passed - Completed PHQ-2 or PHQ-9 in the last 360 days      Passed - Last BP in normal range    BP Readings from Last 1 Encounters:  03/04/23 (!) 100/58         Passed - Valid encounter within last 6 months     Recent Outpatient Visits           4 weeks ago Pap smear for cervical cancer screening   [facility] Jonah Blue B, MD   1 month ago 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[facility])   [facility] Jonah Blue B, MD   6 months ago 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[facility])   [place] Jonah Blue B, MD   10 months ago 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[facility])   [facility] [place], [place], [place]   1 year ago Bronchitis   [place] [facility] [place], Shea Stakes, NP       Future Appointments             In 2 weeks Swinyer, Zachary George, NP [facility]   In 3 months Laural Benes, Binnie Rail, MD [facility]

## 2023-03-19 NOTE — Progress Notes (Signed)
Cardiology Office Note:    Date:  03/27/2023   ID:  Kelly Lang, DOB [birth year], MRN [id number]  PCP:  Marcine Matar, MD   [facility] Providers Cardiologist:  Kristeen Miss, MD Electrophysiologist:  Hillis Range, MD (Inactive)     Referring MD: Marcine Matar, MD   Chief Complaint: follow-up post  History of Present Illness:    Kelly Lang is a pleasant 62 y.o. female with a hx of persistent atrial fibrillation on chronic anticoagulation, chronic HFpEF, hypertension, CVA, hyperlipidemia, type 2 diabetes, morbid obesity, and former tobacco abuse.   Diagnosed with atrial fibrillation 2014. Nuclear stress test negative for ischemia in 2015.  On flecainide 2015, stopped in 2018 with failure to maintain NSR. PVI ablation 06/30/2017.  CVA 09/2015 in the setting of Coumadin/flecainide noncompliance.  Her insurance was not paying for flecainide. Echo at that time with LVEF 20 to 25%, she was placed on Xarelto and metoprolol.  Seen in our office on 03/31/2022 by Dr. Elease Hashimoto at which time she was maintaining sinus rhythm. Seen by Francis Dowse, PA on 5/3 for EP follow-up. She was agreeable to continue to follow-up with Dr. Elease Hashimoto and with EP as needed.   Admission 7/22-7/26/23 exertional dyspnea, chest pain, nausea and vomiting. Cardiology consulted. She initially thought it was due to gastroparesis. BNP mildly elevated to 295. Hs tropoinin elevated to a peak of 106.  On EKG she had no acute ischemic changes but did have multiple PVCs.  Echo this admission revealed LVEF 45 to 50% with septal and apical inferior hypokinesis that was not present on echo 12/2017. Chest CT showed no pulmonary embolism but she did have moderate coronary atherosclerosis.  Nuclear stress test and ETT in the past were low risk but no previous definitive assessment of her coronaries. Q waves present on septal leads on EKG. Recommended to proceed with LHC by Dr. Duke Salvia which was performed on 06/24/2022.  LHC revealed  100% occlusion of proximal to mid LAD within the heavily calcified segment.  Right to left collaterals fill the LAD retrograde.  Thrombus noted beyond the high-grade obstruction in the proximal vessel.  Widely patent left main, circumflex, and right coronary.  EF 45 to 50%, LVEDP 5 mmHg.  Consideration of repeat attempt at PCI after several days to allow thrombus resolution versus medical therapy for 6 weeks and return to CTO team for consideration of PCI.  Per Dr. Swaziland on 06/25/2022 patient was asymptomatic on medical therapy. LAD collateralized from the RCA. Recommend continued medical therapy.  Indication for repeat attempted PCI would be refractory angina despite medical therapy.  Seen by me on 07/02/22 for hospital follow-up, accompanied by a friend. Feeling "tired,  weak and shaky." Having headaches that occur in the afternoon. 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 No chest pain. Gets a little out of breath walking. Felt a poking sensation in left chest for a short duration yesterday, no further episodes like this.  States that she has noticed her hands trembling since hospital discharge. New medications for her are Imdur and Plavix.  Her metoprolol dose was increased.  Blood sugar readings have been within normal range during this time.  Has not been monitoring BP at home, but her friend is a Engineer, civil (consulting) and will start to do so. She denies lower extremity edema, fatigue, palpitations, melena, hematuria, hemoptysis, diaphoresis, presyncope, syncope, orthopnea, and PND. Advised to gradually increase activity and return in 2  months, sooner if more symptomatic.   Seen by Dr. Elease Hashimoto 09/09/22 with concerns for intrascapular pain and SOB with exertion.  Due to previous findings on catheterization, she was scheduled for PCI/CSI atherectomy of her proximal LAD. On 10/19 she underwent successful atherectomy and stenting of subtotal 99% proximal LAD  stenosis with heavy calcification using orbital atherectomy with a CSI classic crown and stenting with a 2.75 x 32 mm Synergy DES. Procedure complicated by distal embolization with apical occlusion of the LAD at the completion of the procedure.  Overnight observation and DAPT with aspirin and clopidogrel for at least 12 months.   Seen by me in cardiology clinic on 10/03/22. Reported she is feeling better since PCI/DES. Returned to work and is walking around [employer] where she works 2 x daily for exercise. No chest pain, shortness of breath, lower extremity edema, fatigue, palpitations, melena, hematuria, hemoptysis, diaphoresis, weakness, presyncope, syncope, orthopnea, and PND. Is aware to stop aspirin on 11/20 and continue Plavix and Xarelto. No bleeding problems currently. PVC on EKG and auscultated during exam, she is unaware of these. She has no specific cardiac concerns today.   Seen for 3 month follow-up by Dr. Elease Hashimoto on 12/19/22.  She was doing well from a cardiac perspective but voiced frustration with her weight.  Felt she had little time to exercise.  She was advised to see me in 3 months for further discussion of weight loss.  Today, she is here for follow-up. Reports overall feeling well but she is exhausted at the end of her work day and does not have a good time to exercise. Her commute to work is 35-40 minutes and she often has to stay late at work. Walks her 60 year old poodle in the evenings. Has been working on eating a healthier diet, has reduced intake of red meat and eating less fried food but has not seen any weight loss. Also sees Dr. Fransico Him for management of diabetes. Has noted increased shortness of breath recently with walking. No orthopnea, PND, weight gain or edema. Has persistent headache in one spot at the back of her head. Recently had her eyes checked and there is no problem with her eyes. Head pain does not worsen with position changes or with activity.  She denies chest pain,  palpitations, presyncope, syncope.  Is tolerating her medications without any concerns. No bleeding problems.    Past Medical History:  Diagnosis Date   Arthritis    "all over" (06/30/2017)   Chicken pox    Dyslipidemia    Dysrhythmia ablation 2018   a-fib   GERD (gastroesophageal reflux disease)    H/O: hypothyroidism    a. thyroid biopsy 1996 with synthroid. Stopped taking rx and was loss to follow up b. normal thyroid function 10/20/13   High cholesterol    Hx of cardiovascular stress test    ETT/Lexiscan Myoview (12/2013):  No ischemia; not gated; low risk.   Hypertension    Obesity    Persistent atrial fibrillation (HCC)    a diagnosed 10/25/2013   Psoriasis    on Skyrizi   Seasonal allergies    Stroke [facility]) 2016   "some speech problems since" (06/30/2017)   Tachycardia induced cardiomyopathy (HCC)    a. 2/2 Afib with RVR for unknown duration (EF: 40-45%)   Thyroid disease    Type II diabetes mellitus (HCC)    a. hg A1c 10, newly diagnosed (10/26/13)    Past Surgical History:  Procedure Laterality Date  ATRIAL FIBRILLATION ABLATION  06/30/2017   ATRIAL FIBRILLATION ABLATION N/A 06/30/2017   Procedure: Atrial Fibrillation Ablation;  Surgeon: Hillis Range, MD;  Location: [facility] INVASIVE CV [facility];  Service: Cardiovascular;  Laterality: N/A;   BICEPT TENODESIS Right 05/24/2020   Procedure: BICEPS TENODESIS;  Surgeon: Cammy Copa, MD;  Location: [facility];  Service: Orthopedics;  Laterality: Right;   BIOPSY THYROID  1996   CARDIOVERSION N/A 12/07/2013   Procedure: CARDIOVERSION;  Surgeon: Everette Rank, MD;  Location: [facility] ENDOSCOPY;  Service: Cardiovascular;  Laterality: N/A;   CARDIOVERSION N/A 03/10/2016   Procedure: CARDIOVERSION;  Surgeon: Vesta Mixer, MD;  Location: [facility] ENDOSCOPY;  Service: Cardiovascular;  Laterality: N/A;   CORONARY ATHERECTOMY N/A 09/18/2022   Procedure: CORONARY ATHERECTOMY;  Surgeon: Tonny Bollman, MD;  Location: [facility] INVASIVE CV  LAB;  Service: Cardiovascular;  Laterality: N/A;   CORONARY BALLOON ANGIOPLASTY N/A 06/24/2022   Procedure: CORONARY BALLOON ANGIOPLASTY;  Surgeon: Lyn Records, MD;  Location: [facility];  Service: Cardiovascular;  Laterality: N/A;   CORONARY STENT INTERVENTION N/A 09/18/2022   Procedure: CORONARY STENT INTERVENTION;  Surgeon: Tonny Bollman, MD;  Location: [facility] INVASIVE CV LAB;  Service: Cardiovascular;  Laterality: N/A;   ESOPHAGOGASTRODUODENOSCOPY N/A 12/07/2017   Procedure: ESOPHAGOGASTRODUODENOSCOPY (EGD);  Surgeon: Hilarie Fredrickson, MD;  Location: [facility] ENDOSCOPY;  Service: Endoscopy;  Laterality: N/A;   EYE MUSCLE SURGERY Right ~ 1966   LEFT HEART CATH AND CORONARY ANGIOGRAPHY N/A 06/24/2022   Procedure: LEFT HEART CATH AND CORONARY ANGIOGRAPHY;  Surgeon: Lyn Records, MD;  Location: [facility];  Service: Cardiovascular;  Laterality: N/A;   SHOULDER ARTHROSCOPY WITH ROTATOR CUFF REPAIR Right 05/24/2020   Procedure: right shoulder arthroscopy, rotator cuff tear repair biceps tenodesis;  Surgeon: Cammy Copa, MD;  Location: [facility];  Service: Orthopedics;  Laterality: Right;   TEE WITHOUT CARDIOVERSION N/A 06/29/2017   Procedure: TRANSESOPHAGEAL ECHOCARDIOGRAM (TEE);  Surgeon: Pricilla Riffle, MD;  Location: [facility] ENDOSCOPY;  Service: Cardiovascular;  Laterality: N/A;   TONSILLECTOMY  1971    Current Medications: Current Meds  Medication Sig   acetaminophen (TYLENOL) 500 MG tablet Take 1,000 mg by mouth every 6 (six) hours as needed for headache.   atorvastatin (LIPITOR) 80 MG tablet Take 1 tablet (80 mg total) by mouth daily.   clopidogrel (PLAVIX) 75 MG tablet Take 1 tablet (75 mg total) by mouth daily with breakfast.   Continuous Blood Gluc Receiver (DEXCOM G7 RECEIVER) DEVI Use to monitor BG continuously   Continuous Blood Gluc Sensor (DEXCOM G7 SENSOR) MISC CHANGE SENSOR EVERY 10 DAYS   diclofenac Sodium (VOLTAREN) 1 % GEL Apply 2 g topically 4 (four)  times daily.   DULoxetine (CYMBALTA) 30 MG capsule Take 1 capsule by mouth once daily   glipiZIDE (GLUCOTROL XL) 5 MG 24 hr tablet Take 1 tablet by mouth once daily with breakfast   isosorbide mononitrate (IMDUR) 30 MG 24 hr tablet Take 1 tablet (30 mg total) by mouth daily.   lisinopril (ZESTRIL) 10 MG tablet Take 1 tablet by mouth once daily   metFORMIN (GLUCOPHAGE) 1000 MG tablet TAKE 1 TABLET BY MOUTH TWICE DAILY WITH A MEAL   metoCLOPramide (REGLAN) 10 MG tablet Take 1 tablet (10 mg total) by mouth every 6 (six) hours.   metoprolol succinate (TOPROL-XL) 25 MG 24 hr tablet Take 3 tablets (75 mg total) by mouth daily.   ONETOUCH DELICA LANCETS 33G MISC Use as instructed to check blood sugar  up to 3 times daily.   ONETOUCH VERIO test strip USE STRIPS TO CHECK GLUCOSE UP TO THREE TIMES DAILY AS DIRECTED   pantoprazole (PROTONIX) 40 MG tablet Take 1 tablet (40 mg total) by mouth daily.   rivaroxaban (XARELTO) 20 MG TABS tablet Take 1 tablet (20 mg total) by mouth daily with supper.   SEMGLEE, YFGN, 100 UNIT/ML Pen INJECT 80 UNITS SUBCUTANEOUSLY AT BEDTIME   SKYRIZI PEN 150 MG/ML SOAJ Inject 150 mg into the skin every 3 (three) months.   spironolactone (ALDACTONE) 25 MG tablet Take 0.5 tablets (12.5 mg total) by mouth daily.   tirzepatide [facility]) 5 MG/0.5ML Pen Inject 5 mg into the skin once a week.   TRUEPLUS PEN NEEDLES 32G X 4 MM MISC USE AS DIRECTED AT BEDTIME.     Allergies:   Jardiance [empagliflozin] and Codeine   Social History   Socioeconomic History   Marital status: Divorced    Spouse name: Not on file   Number of children: 0   Years of education: 14   Highest education level: Not on file  Occupational History   Occupation: Designer, television/film set  Tobacco Use   Smoking status: Former    Packs/day: 1.00    Years: 22.00    Additional pack years: 0.00    Total pack years: 22.00    Types: Cigarettes    Quit date: 12/01/2010    Years since quitting: 12.3   Smokeless tobacco:  Never  Vaping Use   Vaping Use: Never used  Substance and Sexual Activity   Alcohol use: Yes    Comment: social   Drug use: No   Sexual activity: Not Currently  Other Topics Concern   Not on file  Social History Narrative   Moved to [place] from [place] in 2002.     Fun: shop, sew, decorate   Denies any beliefs effecting healthcare   Social Determinants of Health   Financial Resource Strain: Not on file  Food Insecurity: No Food Insecurity (09/19/2022)   Hunger Vital Sign    Worried About Running Out of Food in the Last Year: Never true    Ran Out of Food in the Last Year: Never true  Transportation Needs: No Transportation Needs (09/19/2022)   PRAPARE - Administrator, Civil Service (Medical): No    Lack of Transportation (Non-Medical): No  Physical Activity: Not on file  Stress: Not on file  Social Connections: Not on file     Family History: The patient's family history includes Hyperlipidemia in her mother; Hypertension in her mother. She was adopted.  ROS:   Please see the history of present illness.  All other systems reviewed and are negative.  Labs/Other Studies Reviewed:    The following studies were reviewed today:  Coronary atherectomy 09/18/22    Prox LAD to Mid LAD lesion is 100% stenosed.   RPDA lesion is 20% stenosed.   A drug-eluting stent was successfully placed using a SYNERGY XD 2.75X32.   Post intervention, there is a 0% residual stenosis.   Successful atherectomy and stenting of subtotal 99% proximal LAD stenosis with heavy calcification using orbital atherectomy with a CSI classic crown and stenting with a 2.75 x 32 mm Synergy DES.  Procedure complicated by distal embolization with apical occlusion of the LAD at the completion of the procedure.  Otherwise TIMI-3 flow is present throughout.  Recommend overnight observation and would continue DAPT with aspirin and clopidogrel for at least 12 months.  Echo  06/23/22   1. Septal ,  inferior apical hypokinesis . Left ventricular ejection  fraction, by estimation, is 45 to 50%. The left ventricle has mildly  decreased function. The left ventricle demonstrates regional wall motion  abnormalities (see scoring diagram/findings   for description). There is mild left ventricular hypertrophy. Left  ventricular diastolic parameters were normal.   2. Right ventricular systolic function is normal. The right ventricular  size is normal.   3. Left atrial size was mildly dilated.   4. The mitral valve is abnormal. No evidence of mitral valve  regurgitation. No evidence of mitral stenosis.   5. The aortic valve is tricuspid. There is mild calcification of the  aortic valve. Aortic valve regurgitation is not visualized. Aortic valve  sclerosis is present, with no evidence of aortic valve stenosis.   6. Aortic dilatation noted. There is dilatation of the aortic root,  measuring 38 mm.   7. The inferior vena cava is normal in size with greater than 50%  respiratory variability, suggesting right atrial pressure of 3 mmHg.   LHC 06/24/22  CONCLUSIONS: 100% occlusion of the proximal to mid LAD within the heavily calcified segment.  Right to left collaterals fill the LAD retrograde.  Thrombus is noted beyond the high-grade obstruction in the proximal vessel. Widely patent left main, circumflex, and right coronary Mild reduction in anteroapical contractility.  EF 45 to 50%.  EDP 5 mmHg.   RECOMMENDATIONS:   IV heparin rather than Xarelto until a final decision concerning treatment strategy is made. Start anti-ischemic therapy. Patient is loaded with Plavix. Discussed with Dr. Swaziland.  Consider repeat attempt at PCI after several days to allow thrombus resolution versus medical therapy for 6 weeks returning to the CTO team for consideration of PCI that would likely include plaque modification with orbital atherectomy.  Diagnostic Dominance: Right    Recent Labs: 06/21/2022: B  Natriuretic Peptide 294.6 06/25/2022: Magnesium 2.0 10/09/2022: Hemoglobin 12.8; Platelets 265 03/26/2023: ALT 21; BUN 12; Creatinine, Ser 0.59; Potassium 4.3; Sodium 140  Recent Lipid Panel    Component Value Date/Time   CHOL 128 03/26/2023 1117   TRIG 146 03/26/2023 1117   HDL 36 (L) 03/26/2023 1117   CHOLHDL 3.6 03/26/2023 1117   CHOLHDL 6.8 06/25/2022 0514   VLDL 61 (H) 06/25/2022 0514   LDLCALC 66 03/26/2023 1117   LDLDIRECT 114.0 02/13/2015 0750     Risk Assessment/Calculations:     CHA2DS2-VASc Score = 6  The patient's score is based upon: CHF History: 1 HTN History: 1 Diabetes History: 1 Stroke History: 2 Vascular Disease History: 0 Age Score: 0 Gender Score: 1      Physical Exam:    VS:  BP 112/78 (BP Location: Left Arm, Patient Position: Sitting, Cuff Size: Normal)   Pulse 82   Ht 5\' 5"  (1.651 m)   Wt 235 lb 6.4 oz (106.8 kg)   LMP 04/15/2015 (LMP Unknown) Comment: last in may  SpO2 98%   BMI 39.17 kg/m     Wt Readings from Last 3 Encounters:  03/26/23 235 lb 6.4 oz (106.8 kg)  03/04/23 236 lb (107 kg)  02/09/23 234 lb (106.1 kg)     GEN: Well developed, obese female in no acute distress HEENT: Normal NECK: No JVD; No carotid bruits CARDIAC: RRR, no murmurs, rubs, gallops RESPIRATORY:  Clear to auscultation without rales, wheezing or rhonchi  ABDOMEN: Soft, non-tender, non-distended MUSCULOSKELETAL:  No edema; No deformity. 2+ pedal pulses, equal bilaterally SKIN: Warm and dry. Right  radial cath site well healed with minor bruising distal to wrist.  NEUROLOGIC:  Alert and oriented x 3 PSYCHIATRIC:  Normal affect   EKG:  EKG is not ordered today  Diagnoses:    1. Coronary artery disease involving native coronary artery of native heart without angina pectoris   2. PAF (paroxysmal atrial fibrillation) (HCC)   3. Hyperlipidemia LDL goal <70   4. HFrEF (heart failure with reduced ejection fraction) (HCC)   5. Chronic anticoagulation   6. Essential  hypertension, benign   7. Class 2 obesity due to excess calories with body mass index (BMI) of 38.0 to 38.9 in adult, unspecified whether serious comorbidity present      Assessment and Plan:     CAD without angina: LHC 05/2022 with 100% occlusion of proximal and mid LAD with right to left collaterals. Started on medical therapy with worsening of unstable angina and subsequent successful atherectomy and stenting of subtotal 99% proximal LAD stenosis with heavy calcification using orbital atherectomy with a CSI classic crown and DES placement on 09/18/22. She denies chest pain, dyspnea, or other symptoms concerning for angina. No bleeding concerns. Continue to work on LDL goal < 55. Continue GDMT including aspirin, atorvastatin, clopidogrel, Imdur, lisinopril, metoprolol.   Hypertension: BP is well controlled.     Atrial fibrillation on chronic anticoagulation: S/p a fib ablation 05/2017. Clinically appears to be maintaining NSR. HR is well-controlled. No bleeding concerns presently. Continue metoprolol for rate control. Continue Xarelto for stroke prevention for CHA2DS2-VASc score of 6.   HFmrEF: Echo 06/23/22 with LVEF 45 to 50%, normal diastolic parameters, mild LVH, normal RV, no significant valve disease. Appears euvolemic on exam, however body habitus makes it difficult to assess. Reports slight increase in shortness of breath with walking. No dyspnea, orthopnea, weight gain, edema, PND. Reports she is avoiding high sodium, processed foods.  We will add low-dose spironolactone 12.5 mg daily to see if this improves shortness of breath. Will get repeat BMP in 2 weeks. Encouraged her to increase intensity of walking gradually and report to [place] if symptoms worsen.   Hyperlipidemia LDL goal < 70: LDL 65 on 10/09/22. Emphasized the importance of LDL 55 or less. Continue atorvastatin.   Obesity: Lengthy discussion about heart healthy, mostly plant based diet, weight loss, and exercise. Encouraged 150  minutes moderate intensity exercise each week.  Disposition: 6 months with Dr. Elease Hashimoto or APP    Medication Adjustments/Labs and Tests Ordered: Current medicines are reviewed at length with the patient today.  Concerns regarding medicines are outlined above.  Orders Placed This Encounter  Procedures   Basic Metabolic Panel (BMET)   Comp Met (CMET)   Lipid Profile   Meds ordered this encounter  Medications   spironolactone (ALDACTONE) 25 MG tablet    Sig: Take 0.5 tablets (12.5 mg total) by mouth daily.    Dispense:  45 tablet    Refill:  3    Patient Instructions  Medication Instructions:   START Aldactone one half (0.5) tablet by mouth (12.5 mg) daily.   *If you need a refill on your cardiac medications before your next appointment, please call your pharmacy*   Lab Work:  TODAY!!!! LIPID/CMET  Your physician recommends that you return for lab work on Wednesday, May 8. You can come in on the day of your appointment anytime.  If you have labs (blood work) drawn today and your tests are completely normal, you will receive your results only by: MyChart Message (if you have  MyChart) OR A paper copy in the mail If you have any lab test that is abnormal or we need to change your treatment, we will call you to review the results.   Testing/Procedures:  None ordered.   Follow-Up: At [facility], you and your health needs are our priority.  As part of our continuing mission to provide you with exceptional heart care, we have created designated Provider Care Teams.  These Care Teams include your primary Cardiologist (physician) and Advanced Practice Providers (APPs -  Physician Assistants and Nurse Practitioners) who all work together to provide you with the care you need, when you need it.  We recommend signing up for the patient portal called "MyChart".  Sign up information is provided on this After Visit Summary.  MyChart is used to connect with patients for Virtual  Visits (Telemedicine).  Patients are able to view lab/test results, encounter notes, upcoming appointments, etc.  Non-urgent messages can be sent to your provider as well.   To learn more about what you can do with MyChart, go to [email or web address].    Your next appointment:   6 month(s)  Provider:   Kristeen Miss, MD       Signed, Levi Aland, NP  03/27/2023 8:07 AM    [facility]

## 2023-03-23 ENCOUNTER — Other Ambulatory Visit: Payer: Self-pay | Admitting: Internal Medicine

## 2023-03-23 DIAGNOSIS — E1169 Type 2 diabetes mellitus with other specified complication: Secondary | ICD-10-CM

## 2023-03-24 NOTE — Telephone Encounter (Signed)
Requested Prescriptions  Pending Prescriptions Disp Refills   metFORMIN (GLUCOPHAGE) 1000 MG tablet [Pharmacy Med Name: metFORMIN HCl 1000 MG Oral Tablet] 180 tablet 0    Sig: TAKE 1 TABLET BY MOUTH TWICE DAILY WITH A MEAL     Endocrinology:  Diabetes - Biguanides Failed - 03/23/2023 11:03 PM      Failed - B12 Level in normal range and within 720 days    Vitamin B-12  Date Value Ref Range Status  08/16/2020 349 232 - 1,245 pg/mL Final         Passed - Cr in normal range and within 360 days    Creat  Date Value Ref Range Status  06/16/2016 0.90 0.50 - 1.05 mg/dL Final    Comment:      For patients > or = 60 years of age: The upper reference limit for Creatinine is approximately 13% higher for people identified as African-American.      Creatinine, Ser  Date Value Ref Range Status  10/09/2022 0.69 0.57 - 1.00 mg/dL Final   Creatinine, POC  Date Value Ref Range Status  09/24/2020 300 mg/dL Final         Passed - HBA1C is between 0 and 7.9 and within 180 days    HbA1c, POC (controlled diabetic range)  Date Value Ref Range Status  03/04/2023 7.8 (A) 0.0 - 7.0 % Final         Passed - eGFR in normal range and within 360 days    GFR, Est African American  Date Value Ref Range Status  06/16/2016 84 >=60 mL/min Final   GFR calc Af Amer  Date Value Ref Range Status  12/25/2020 118 >59 mL/min/1.73 Final    Comment:    **In accordance with recommendations from the NKF-ASN Task force,**   [facility] is in the process of updating its eGFR calculation to the   2021 CKD-EPI creatinine equation that estimates kidney function   without a race variable.    GFR, Est Non African American  Date Value Ref Range Status  06/16/2016 73 >=60 mL/min Final   GFR, Estimated  Date Value Ref Range Status  09/19/2022 >60 >60 mL/min Final    Comment:    (NOTE) Calculated using the CKD-EPI Creatinine Equation (2021)    GFR  Date Value Ref Range Status  02/13/2015 107.47 >60.00 mL/min  Final   eGFR  Date Value Ref Range Status  10/09/2022 100 >59 mL/min/1.73 Final         Passed - Valid encounter within last 6 months    Recent Outpatient Visits           1 month ago Pap smear for cervical cancer screening   [place] [facility] Jonah Blue B, MD   2 months ago 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[facility])   [place] [facility] Jonah Blue B, MD   6 months ago 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[facility])   [place] [facility] Jonah Blue B, MD   10 months ago 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[facility])   [place] [facility] [place], [place], [place]   1 year ago Bronchitis   [place] [facility] [place], Shea Stakes, NP       Future Appointments             In 2 days Swinyer, Zachary George, NP [facility] at  [address], Administrator   In 2 months Laural Benes, Binnie Rail, MD [facility]            Passed - CBC within normal limits and completed in the last 12 months    WBC  Date Value Ref Range Status  10/09/2022 6.5 3.4 - 10.8 x10E3/uL Final  09/19/2022 6.0 4.0 - 10.5 K/uL Final   RBC  Date Value Ref Range Status  10/09/2022 4.00 3.77 - 5.28 x10E6/uL Final  09/19/2022 3.78 (L) 3.87 - 5.11 MIL/uL Final   Hemoglobin  Date Value Ref Range Status  10/09/2022 12.8 11.1 - 15.9 g/dL Final   Hematocrit  Date Value Ref Range Status  10/09/2022 38.9 34.0 - 46.6 % Final   MCHC  Date Value Ref Range Status  10/09/2022 32.9 31.5 - 35.7 g/dL Final  16/09/9603 54.0 30.0 - 36.0 g/dL Final   [facility]  Date Value Ref Range Status  10/09/2022 32.0 26.6 - 33.0 pg Final  09/19/2022 31.7 26.0 - 34.0 pg Final   MCV  Date Value Ref Range Status  10/09/2022 97 79 - 97 fL Final   No results found for: "PLTCOUNTKUC", "LABPLAT", "POCPLA" RDW  Date Value Ref Range Status  10/09/2022 12.5  11.7 - 15.4 % Final

## 2023-03-26 ENCOUNTER — Ambulatory Visit: Payer: BC Managed Care – PPO | Attending: Nurse Practitioner | Admitting: Nurse Practitioner

## 2023-03-26 ENCOUNTER — Encounter: Payer: Self-pay | Admitting: Nurse Practitioner

## 2023-03-26 VITALS — BP 112/78 | HR 82 | Ht 65.0 in | Wt 235.4 lb

## 2023-03-26 DIAGNOSIS — I5020 Unspecified systolic (congestive) heart failure: Secondary | ICD-10-CM

## 2023-03-26 DIAGNOSIS — E785 Hyperlipidemia, unspecified: Secondary | ICD-10-CM | POA: Diagnosis not present

## 2023-03-26 DIAGNOSIS — E6609 Other obesity due to excess calories: Secondary | ICD-10-CM

## 2023-03-26 DIAGNOSIS — I480 Paroxysmal atrial fibrillation: Secondary | ICD-10-CM | POA: Diagnosis not present

## 2023-03-26 DIAGNOSIS — I10 Essential (primary) hypertension: Secondary | ICD-10-CM

## 2023-03-26 DIAGNOSIS — I2510 Atherosclerotic heart disease of native coronary artery without angina pectoris: Secondary | ICD-10-CM | POA: Diagnosis not present

## 2023-03-26 DIAGNOSIS — Z7901 Long term (current) use of anticoagulants: Secondary | ICD-10-CM

## 2023-03-26 DIAGNOSIS — Z6838 Body mass index (BMI) 38.0-38.9, adult: Secondary | ICD-10-CM

## 2023-03-26 MED ORDER — SPIRONOLACTONE 25 MG PO TABS: 12.5000 mg | ORAL_TABLET | Freq: Every day | ORAL | 3 refills | Status: DC

## 2023-03-26 NOTE — Patient Instructions (Signed)
Medication Instructions:   START Aldactone one half (0.5) tablet by mouth (12.5 mg) daily.   *If you need a refill on your cardiac medications before your next appointment, please call your pharmacy*   Lab Work:  TODAY!!!! LIPID/CMET  Your physician recommends that you return for lab work on Wednesday, May 8. You can come in on the day of your appointment anytime.  If you have labs (blood work) drawn today and your tests are completely normal, you will receive your results only by: MyChart Message (if you have MyChart) OR A paper copy in the mail If you have any lab test that is abnormal or we need to change your treatment, we will call you to review the results.   Testing/Procedures:  None ordered.   Follow-Up: At [facility], you and your health needs are our priority.  As part of our continuing mission to provide you with exceptional heart care, we have created designated Provider Care Teams.  These Care Teams include your primary Cardiologist (physician) and Advanced Practice Providers (APPs -  Physician Assistants and Nurse Practitioners) who all work together to provide you with the care you need, when you need it.  We recommend signing up for the patient portal called "MyChart".  Sign up information is provided on this After Visit Summary.  MyChart is used to connect with patients for Virtual Visits (Telemedicine).  Patients are able to view lab/test results, encounter notes, upcoming appointments, etc.  Non-urgent messages can be sent to your provider as well.   To learn more about what you can do with MyChart, go to [email or web address].    Your next appointment:   6 month(s)  Provider:   Kristeen Miss, MD

## 2023-03-27 ENCOUNTER — Encounter: Payer: Self-pay | Admitting: Nurse Practitioner

## 2023-03-27 LAB — COMPREHENSIVE METABOLIC PANEL
ALT: 21 IU/L (ref 0–32)
AST: 14 IU/L (ref 0–40)
Albumin/Globulin Ratio: 1.6 (ref 1.2–2.2)
Albumin: 4 g/dL (ref 3.8–4.9)
Alkaline Phosphatase: 78 IU/L (ref 44–121)
BUN/Creatinine Ratio: 20 (ref 12–28)
BUN: 12 mg/dL (ref 8–27)
Bilirubin Total: 0.5 mg/dL (ref 0.0–1.2)
CO2: 22 mmol/L (ref 20–29)
Calcium: 9.2 mg/dL (ref 8.7–10.3)
Chloride: 105 mmol/L (ref 96–106)
Creatinine, Ser: 0.59 mg/dL (ref 0.57–1.00)
Globulin, Total: 2.5 g/dL (ref 1.5–4.5)
Glucose: 149 mg/dL — ABNORMAL HIGH (ref 70–99)
Potassium: 4.3 mmol/L (ref 3.5–5.2)
Sodium: 140 mmol/L (ref 134–144)
Total Protein: 6.5 g/dL (ref 6.0–8.5)
eGFR: 103 mL/min/{1.73_m2} (ref >59)

## 2023-03-27 LAB — LIPID PANEL
Chol/HDL Ratio: 3.6 ratio (ref 0.0–4.4)
Cholesterol, Total: 128 mg/dL (ref 100–199)
HDL: 36 mg/dL — ABNORMAL LOW (ref >39)
LDL Chol Calc (NIH): 66 mg/dL (ref 0–99)
Triglycerides: 146 mg/dL (ref 0–149)
VLDL Cholesterol Cal: 26 mg/dL (ref 5–40)

## 2023-04-07 ENCOUNTER — Other Ambulatory Visit: Payer: Self-pay | Admitting: "Endocrinology

## 2023-04-07 DIAGNOSIS — Z794 Long term (current) use of insulin: Secondary | ICD-10-CM

## 2023-04-08 ENCOUNTER — Ambulatory Visit: Payer: BC Managed Care – PPO | Attending: Nurse Practitioner

## 2023-04-08 ENCOUNTER — Ambulatory Visit
Admission: RE | Admit: 2023-04-08 | Discharge: 2023-04-08 | Disposition: A | Discharge status: Home / Self Care | Payer: BC Managed Care – PPO | Source: Ambulatory Visit | Attending: Internal Medicine | Admitting: Internal Medicine

## 2023-04-08 DIAGNOSIS — I2510 Atherosclerotic heart disease of native coronary artery without angina pectoris: Secondary | ICD-10-CM

## 2023-04-08 DIAGNOSIS — I480 Paroxysmal atrial fibrillation: Secondary | ICD-10-CM

## 2023-04-08 DIAGNOSIS — I5020 Unspecified systolic (congestive) heart failure: Secondary | ICD-10-CM

## 2023-04-08 DIAGNOSIS — Z1231 Encounter for screening mammogram for malignant neoplasm of breast: Secondary | ICD-10-CM

## 2023-04-08 DIAGNOSIS — E785 Hyperlipidemia, unspecified: Secondary | ICD-10-CM

## 2023-04-08 LAB — BASIC METABOLIC PANEL
BUN/Creatinine Ratio: 13 (ref 12–28)
BUN: 10 mg/dL (ref 8–27)
CO2: 21 mmol/L (ref 20–29)
Calcium: 8.9 mg/dL (ref 8.7–10.3)
Chloride: 102 mmol/L (ref 96–106)
Creatinine, Ser: 0.76 mg/dL (ref 0.57–1.00)
Glucose: 239 mg/dL — ABNORMAL HIGH (ref 70–99)
Potassium: 4.7 mmol/L (ref 3.5–5.2)
Sodium: 137 mmol/L (ref 134–144)
eGFR: 90 mL/min/{1.73_m2} (ref >59)

## 2023-05-08 ENCOUNTER — Telehealth: Payer: Self-pay

## 2023-05-08 NOTE — Telephone Encounter (Signed)
Left a message requesting pt return call to the office. 

## 2023-05-11 ENCOUNTER — Other Ambulatory Visit: Payer: Self-pay | Admitting: "Endocrinology

## 2023-05-13 ENCOUNTER — Other Ambulatory Visit: Payer: Self-pay | Admitting: "Endocrinology

## 2023-05-13 MED ORDER — TIRZEPATIDE 7.5 MG/0.5ML ~~LOC~~ SOAJ: 7.5000 mg | SUBCUTANEOUS | 0 refills | Status: DC

## 2023-05-29 ENCOUNTER — Emergency Department (HOSPITAL_BASED_OUTPATIENT_CLINIC_OR_DEPARTMENT_OTHER)
Admission: EM | Admit: 2023-05-29 | Discharge: 2023-05-30 | Disposition: A | Discharge status: Home / Self Care | Payer: BC Managed Care – PPO | Attending: Emergency Medicine | Admitting: Emergency Medicine

## 2023-05-29 ENCOUNTER — Emergency Department (HOSPITAL_BASED_OUTPATIENT_CLINIC_OR_DEPARTMENT_OTHER): Payer: BC Managed Care – PPO

## 2023-05-29 ENCOUNTER — Encounter (HOSPITAL_BASED_OUTPATIENT_CLINIC_OR_DEPARTMENT_OTHER): Payer: Self-pay

## 2023-05-29 ENCOUNTER — Other Ambulatory Visit: Payer: Self-pay

## 2023-05-29 DIAGNOSIS — Z7901 Long term (current) use of anticoagulants: Secondary | ICD-10-CM | POA: Diagnosis not present

## 2023-05-29 DIAGNOSIS — Y92481 Parking lot as the place of occurrence of the external cause: Secondary | ICD-10-CM | POA: Diagnosis not present

## 2023-05-29 DIAGNOSIS — Z7902 Long term (current) use of antithrombotics/antiplatelets: Secondary | ICD-10-CM | POA: Insufficient documentation

## 2023-05-29 DIAGNOSIS — S92354A Nondisplaced fracture of fifth metatarsal bone, right foot, initial encounter for closed fracture: Secondary | ICD-10-CM | POA: Diagnosis not present

## 2023-05-29 DIAGNOSIS — X501XXA Overexertion from prolonged static or awkward postures, initial encounter: Secondary | ICD-10-CM | POA: Diagnosis not present

## 2023-05-29 DIAGNOSIS — S99921A Unspecified injury of right foot, initial encounter: Secondary | ICD-10-CM | POA: Diagnosis present

## 2023-05-29 MED ORDER — IBUPROFEN 800 MG PO TABS
800.0000 mg | ORAL_TABLET | Freq: Once | ORAL | Status: AC
  Administered 2023-05-29: 800 mg via ORAL
  Filled 2023-05-29: qty 1

## 2023-05-29 MED ORDER — HYDROCODONE-ACETAMINOPHEN 5-325 MG PO TABS: 1.0000 | ORAL_TABLET | ORAL | 0 refills | Status: DC | PRN

## 2023-05-29 NOTE — ED Provider Notes (Signed)
[place] EMERGENCY DEPARTMENT AT [facility] Provider Note   CSN: [id number] Arrival date & time: 05/29/23  2131     History  Chief Complaint  Patient presents with   Foot Injury    Kelly Lang is a 62 y.o. female.  Patient presents with right foot and ankle pain.  States she was walking in a parking lot about 3 PM when she stepped in a pothole and twisted her right foot.  Foot twisted outward.  She did not fall or hit her head.  She does take Xarelto for history of atrial fibrillation.  Denies any other injury.  Complains of difficulty with weightbearing to her right foot and is having to walk on her heels.  No numbness or tingling.  No head, neck, back, chest or abdominal pain.  No focal weakness, numbness or tingling.  Did not take anything at home for pain.  Drove herself here.  She is confident she did not fall or hit her head.  The history is provided by the patient.  Foot Injury Associated symptoms: no fever        Home Medications Prior to Admission medications   Medication Sig Start Date End Date Taking? Authorizing Provider  acetaminophen (TYLENOL) 500 MG tablet Take 1,000 mg by mouth every 6 (six) hours as needed for headache.    [provider]  atorvastatin (LIPITOR) 80 MG tablet Take 1 tablet (80 mg total) by mouth daily. 07/18/22   Nahser, [name], MD  clopidogrel (PLAVIX) 75 MG tablet Take 1 tablet (75 mg total) by mouth daily with breakfast. 07/18/22   Nahser, [name], MD  Continuous Blood Gluc Receiver (DEXCOM G7 RECEIVER) DEVI Use to monitor BG continuously 03/12/22   Nida, Denman George, MD  Continuous Glucose Sensor (DEXCOM G7 SENSOR) MISC CHANGE SENSOR EVERY 10 DAYS 05/11/23   Roma Kayser, MD  diclofenac Sodium (VOLTAREN) 1 % GEL Apply 2 g topically 4 (four) times daily. 09/08/22   Marcine Matar, MD  DULoxetine (CYMBALTA) 30 MG capsule Take 1 capsule by mouth once daily 03/09/23   Marcine Matar, MD  glipiZIDE (GLUCOTROL  XL) 5 MG 24 hr tablet Take 1 tablet by mouth once daily with breakfast 03/09/23   Roma Kayser, MD  insulin glargine-yfgn (SEMGLEE, YFGN,) 100 UNIT/ML Pen INJECT 80 UNITS SUBCUTANEOUSLY AT BEDTIME 04/07/23   Roma Kayser, MD  isosorbide mononitrate (IMDUR) 30 MG 24 hr tablet Take 1 tablet (30 mg total) by mouth daily. 07/18/22   Nahser, [name], MD  lisinopril (ZESTRIL) 10 MG tablet Take 1 tablet by mouth once daily 12/23/22   Marcine Matar, MD  metFORMIN (GLUCOPHAGE) 1000 MG tablet TAKE 1 TABLET BY MOUTH TWICE DAILY WITH A MEAL 03/24/23   Marcine Matar, MD  metoCLOPramide (REGLAN) 10 MG tablet Take 1 tablet (10 mg total) by mouth every 6 (six) hours. 04/29/22   Peter Garter, PA  metoprolol succinate (TOPROL-XL) 25 MG 24 hr tablet Take 3 tablets (75 mg total) by mouth daily. 07/18/22   Nahser, [name], MD  [facility] DELICA LANCETS 33G MISC Use as instructed to check blood sugar up to 3 times daily. 10/05/18   Marcine Matar, MD  ONETOUCH VERIO test strip USE STRIPS TO CHECK GLUCOSE UP TO THREE TIMES DAILY AS DIRECTED 12/14/20   Marcine Matar, MD  pantoprazole (PROTONIX) 40 MG tablet Take 1 tablet (40 mg total) by mouth daily. 06/26/22   Arrien, York Ram, MD  rivaroxaban (XARELTO) 20 MG TABS tablet Take 1 tablet (20 mg total) by mouth daily with supper. 07/18/22   Nahser, [name], MD  SKYRIZI PEN 150 MG/ML SOAJ Inject 150 mg into the skin every 3 (three) months. 06/18/22   [provider]  spironolactone (ALDACTONE) 25 MG tablet Take 0.5 tablets (12.5 mg total) by mouth daily. 03/26/23 03/25/24  Swinyer, Zachary George, NP  tirzepatide [facility]) 7.5 MG/0.5ML Pen Inject 7.5 mg into the skin once a week. 05/13/23   Roma Kayser, MD  TRUEPLUS PEN NEEDLES 32G X 4 MM MISC USE AS DIRECTED AT BEDTIME. 08/23/20   Marcine Matar, MD      Allergies    Jardiance [empagliflozin] and Codeine    Review of Systems   Review of Systems  Constitutional:   Negative for activity change, appetite change and fever.  HENT:  Negative for congestion.   Respiratory:  Negative for cough, chest tightness and shortness of breath.   Cardiovascular:  Negative for chest pain.  Gastrointestinal:  Negative for abdominal pain, nausea and vomiting.  Genitourinary:  Negative for dysuria and menstrual problem.  Musculoskeletal:  Positive for arthralgias and myalgias.  Skin:  Negative for wound.  Neurological:  Negative for dizziness, weakness and headaches.   all other systems are negative except as noted in the HPI and PMH.    Physical Exam Updated Vital Signs BP 104/69 (BP Location: Right Arm)   Pulse 75   Temp 98.2 F (36.8 C) (Oral)   Resp 18   Ht 5\' 5"  (1.651 m)   Wt 106.6 kg   LMP 04/15/2015 (LMP Unknown) Comment: last in may  SpO2 100%   BMI 39.11 kg/m  Physical Exam Vitals and nursing note reviewed.  Constitutional:      General: She is not in acute distress.    Appearance: She is well-developed.  HENT:     Head: Normocephalic and atraumatic.     Mouth/Throat:     Pharynx: No oropharyngeal exudate.  Eyes:     Conjunctiva/sclera: Conjunctivae normal.     Pupils: Pupils are equal, round, and reactive to light.  Neck:     Comments: No meningismus. Cardiovascular:     Rate and Rhythm: Normal rate and regular rhythm.     Heart sounds: Normal heart sounds. No murmur heard. Pulmonary:     Effort: Pulmonary effort is normal. No respiratory distress.     Breath sounds: Normal breath sounds.  Abdominal:     Palpations: Abdomen is soft.     Tenderness: There is no abdominal tenderness. There is no guarding or rebound.  Musculoskeletal:        General: Swelling and tenderness present.     Cervical back: Normal range of motion and neck supple.     Comments: Tenderness to right lateral foot.  Intact DP and PT pulse.  Full range of motion of ankle.  Achilles tendon intact.  No proximal fibular tenderness.  Skin:    General: Skin is warm.   Neurological:     Mental Status: She is alert and oriented to person, place, and time.     Cranial Nerves: No cranial nerve deficit.     Motor: No abnormal muscle tone.     Coordination: Coordination normal.     Comments: No ataxia on finger to nose bilaterally. No pronator drift. 5/5 strength throughout. CN 2-12 intact.Equal grip strength. Sensation intact.   Psychiatric:        Behavior: Behavior normal.  ED Results / Procedures / Treatments   Labs (all labs ordered are listed, but only abnormal results are displayed) Labs Reviewed - No data to display  EKG None  Radiology DG Ankle Complete Right  Result Date: 05/29/2023 CLINICAL DATA:  Twisting injury EXAM: RIGHT ANKLE - COMPLETE 3+ VIEW COMPARISON:  None Available. FINDINGS: No acute fracture or malalignment. Ankle mortise is symmetric. Vascular calcifications. Plantar calcaneal spur IMPRESSION: No acute osseous abnormality. Electronically Signed   By: Jasmine Pang M.D.   On: 05/29/2023 22:50   DG Foot Complete Right  Result Date: 05/29/2023 CLINICAL DATA:  Twisting injury EXAM: RIGHT FOOT COMPLETE - 3+ VIEW COMPARISON:  None Available. FINDINGS: Acute nondisplaced fracture involving proximal shaft of fifth metatarsal. Vascular calcifications. Plantar calcaneal spur. Mild degenerative change at the first MTP joint IMPRESSION: Acute nondisplaced fracture involving proximal shaft of fifth metatarsal. Electronically Signed   By: Jasmine Pang M.D.   On: 05/29/2023 22:49    Procedures Procedures    Medications Ordered in ED Medications - No data to display  ED Course/ Medical Decision Making/ A&P                             Medical Decision Making Amount and/or Complexity of Data Reviewed Labs: ordered. Decision-making details documented in ED Course. Radiology: ordered and independent interpretation performed. Decision-making details documented in ED Course. ECG/medicine tests: ordered and independent interpretation  performed. Decision-making details documented in ED Course.  Risk Prescription drug management.   Right foot injury.  Did not fall or hit head.  She is neurovascular intact.  Does take Xarelto.  X-ray is revealing for right fifth metatarsal fracture.  Ankle mortise is intact.  Results reviewed and interpreted by me.  Will give pain control, cam walker boot, crutches, orthopedic follow-up. Recommend ice, elevation, pain control, orthopedic follow-up.  Return precautions discussed.       Final Clinical Impression(s) / ED Diagnoses Final diagnoses:  Closed nondisplaced fracture of fifth metatarsal bone of right foot, initial encounter    Rx / DC Orders ED Discharge Orders     None         Braelen Sproule, Jeannett Senior, MD 05/29/23 2346

## 2023-05-29 NOTE — Discharge Instructions (Signed)
Followup with the orthopedic doctor. You may bear weight as tolerated.  Use the crutches, ice, elevate the leg.  Take the pain medication as prescribed.  Return to the ED for worsening pain, weakness, numbness, tingling or other concerns

## 2023-05-29 NOTE — ED Triage Notes (Signed)
POV from home, A&O x 4, GCS 15, amb in lobby  Sts that 3pm she stepped into pot hole and twisted right ankle and foot. Has been able to walk with minimal weight placed on foot.

## 2023-06-04 ENCOUNTER — Other Ambulatory Visit: Payer: Self-pay | Admitting: "Endocrinology

## 2023-06-04 DIAGNOSIS — E1159 Type 2 diabetes mellitus with other circulatory complications: Secondary | ICD-10-CM

## 2023-06-11 ENCOUNTER — Ambulatory Visit: Payer: BC Managed Care – PPO | Attending: Internal Medicine | Admitting: Internal Medicine

## 2023-06-11 DIAGNOSIS — E1159 Type 2 diabetes mellitus with other circulatory complications: Secondary | ICD-10-CM

## 2023-06-11 DIAGNOSIS — I4892 Unspecified atrial flutter: Secondary | ICD-10-CM | POA: Diagnosis not present

## 2023-06-11 DIAGNOSIS — E1169 Type 2 diabetes mellitus with other specified complication: Secondary | ICD-10-CM | POA: Diagnosis not present

## 2023-06-11 DIAGNOSIS — Z122 Encounter for screening for malignant neoplasm of respiratory organs: Secondary | ICD-10-CM

## 2023-06-11 DIAGNOSIS — I2510 Atherosclerotic heart disease of native coronary artery without angina pectoris: Secondary | ICD-10-CM

## 2023-06-11 DIAGNOSIS — I5022 Chronic systolic (congestive) heart failure: Secondary | ICD-10-CM

## 2023-06-11 DIAGNOSIS — Z794 Long term (current) use of insulin: Secondary | ICD-10-CM

## 2023-06-11 DIAGNOSIS — I152 Hypertension secondary to endocrine disorders: Secondary | ICD-10-CM

## 2023-06-11 NOTE — Progress Notes (Signed)
Patient ID: Kelly Lang, female    DOB: May 04, 1961  MRN: [id number]  CC: Hypertension   Subjective: Kelly Lang is a 62 y.o. 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    Twisted RT foot when she walked into a pot hole 6/26/204. Sustained Fx 5th MT.  Currently in cast; plan for total 3 wks.  Seeing [facility].  Taking Tylenol for pain  HTN/CHF/CAD/A.flutter:  Confirms taking lisinopril 20 mg daily, metoprolol 75 mg daily, isosorbide 30 mg daily, Plavix 75 mg daily, atorvastatin 80 mg daily and Xarelto 20 mg daily No bruising or bleeding No CP/SOB/LE edema  DM:   Lab Results  Component Value Date   HGBA1C 7.8 (A) 03/04/2023  Saw Dr. Fransico Him 03/04/23. Ozempic changed to [facility]; currently at 7.5 mg dose.  Continued Semglee 80 units, Metformin 1 gram BID and Glipizide.  Tolerating Mounjaro.  Feels it has not decreased her appetite.  Can not stand long enough to cook since toe fx; so eating out more.  Current A1c from her CGM is 8.1.  Quit smoking 12 yrs ago.  Was 3/4 pk/day smoker for 28 yrs which makes her a 88 pk/yr history.  She is agreeable to lung cancer screening with low-dose CT of the chest. Patient Active Problem List   Diagnosis Date Noted   PAF (paroxysmal atrial fibrillation) (HCC)    Class 2 obesity 06/25/2022   NSTEMI (non-ST elevated myocardial infarction) (HCC)    Thyroid nodule 06/22/2022   Trigger ring finger of left hand 02/05/2021   Trigger little finger of right hand 02/05/2021   Mixed hyperlipidemia 09/17/2020   Multinodular goiter 09/16/2019   Chronic pain disorder 08/09/2018   Diabetic polyneuropathy associated with type 2 diabetes mellitus (HCC) 08/09/2018   GI bleed  12/06/2017   Hepatic steatosis 12/04/2017   Cerebrovascular accident (CVA) due to 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)    Low back pain 04/03/2014   Vitamin D deficiency 01/20/2014   Essential hypertension, benign 01/20/2014   H/O: hypothyroidism    Class 2 severe obesity due to excess calories with serious comorbidity and body mass index (BMI) of 37.0 to 37.9 in adult [facility])    DM type 2 causing vascular disease (HCC)    Arthritis    Atrial fibrillation  10/25/2013     Current Outpatient Medications on File Prior to Visit  Medication Sig Dispense Refill   acetaminophen (TYLENOL) 500 MG tablet Take 1,000 mg by mouth every 6 (six) hours as needed for headache.     atorvastatin (LIPITOR) 80 MG tablet Take 1 tablet (80 mg total) by mouth daily. 90 tablet 3   clopidogrel (PLAVIX) 75 MG tablet Take 1 tablet (75 mg total) by mouth daily with breakfast. 90 tablet 3   Continuous Blood Gluc Receiver (DEXCOM G7 RECEIVER) DEVI Use to monitor BG continuously 1 each 0   Continuous Glucose Sensor (DEXCOM G7 SENSOR) MISC CHANGE SENSOR EVERY 10 DAYS 3 each 0   diclofenac Sodium (VOLTAREN) 1 % GEL Apply 2 g topically 4 (four) times daily. 100 g 2   DULoxetine (CYMBALTA) 30 MG capsule Take 1 capsule by mouth once daily 90 capsule 1   glipiZIDE (GLUCOTROL XL) 5 MG 24 hr tablet Take  1 tablet by mouth once daily with breakfast 90 tablet 0   insulin glargine-yfgn (SEMGLEE, YFGN,) 100 UNIT/ML Pen INJECT 80 UNITS SUBCUTANEOUSLY AT BEDTIME 30 mL 2   isosorbide mononitrate (IMDUR) 30 MG 24 hr tablet Take 1 tablet (30 mg total) by mouth daily. 90 tablet 3   lisinopril (ZESTRIL) 10 MG tablet Take 1 tablet by mouth once daily 90 tablet 3   metFORMIN (GLUCOPHAGE) 1000 MG tablet TAKE 1 TABLET BY MOUTH TWICE DAILY WITH A MEAL 180 tablet 0   metoCLOPramide (REGLAN) 10 MG tablet Take 1 tablet (10 mg total) by mouth every 6 (six) hours. 30 tablet 0   metoprolol succinate (TOPROL-XL) 25 MG 24 hr tablet Take 3  tablets (75 mg total) by mouth daily. 270 tablet 3   ONETOUCH DELICA LANCETS 33G MISC Use as instructed to check blood sugar up to 3 times daily. 100 each 11   ONETOUCH VERIO test strip USE STRIPS TO CHECK GLUCOSE UP TO THREE TIMES DAILY AS DIRECTED 100 each 6   pantoprazole (PROTONIX) 40 MG tablet Take 1 tablet (40 mg total) by mouth daily. 30 tablet 3   rivaroxaban (XARELTO) 20 MG TABS tablet Take 1 tablet (20 mg total) by mouth daily with supper. 90 tablet 3   SKYRIZI PEN 150 MG/ML SOAJ Inject 150 mg into the skin every 3 (three) months.     spironolactone (ALDACTONE) 25 MG tablet Take 0.5 tablets (12.5 mg total) by mouth daily. 45 tablet 3   tirzepatide (MOUNJARO) 7.5 MG/0.5ML Pen Inject 7.5 mg into the skin once a week. 6 mL 0   TRUEPLUS PEN NEEDLES 32G X 4 MM MISC USE AS DIRECTED AT BEDTIME. 100 each 2   HYDROcodone-acetaminophen (NORCO/VICODIN) 5-325 MG tablet Take 1 tablet by mouth every 4 (four) hours as needed. (Patient not taking: Reported on 06/11/2023) 10 tablet 0   No current facility-administered medications on file prior to visit.    Allergies  Allergen Reactions   Jardiance [Empagliflozin] Other (See Comments)    Yeast infections   Codeine Itching and Rash    Social History   Socioeconomic History   Marital status: Divorced    Spouse name: Not on file   Number of children: 0   Years of education: 14   Highest education level: Some college, no degree  Occupational History   Occupation: Designer, television/film set  Tobacco Use   Smoking status: Former    Current packs/day: 0.00    Average packs/day: 1 pack/day for 22.0 years (22.0 ttl pk-yrs)    Types: Cigarettes    Start date: 12/01/1988    Quit date: 12/01/2010    Years since quitting: 12.5   Smokeless tobacco: Never  Vaping Use   Vaping status: Never Used  Substance and Sexual Activity   Alcohol use: Yes    Comment: social   Drug use: No   Sexual activity: Not Currently  Other Topics Concern   Not on file  Social  History Narrative   Moved to [place] from [place] in 2002.     Fun: shop, sew, decorate   Denies any beliefs effecting healthcare   Social Determinants of Health   Financial Resource Strain: Low Risk  (06/11/2023)   Overall Financial Resource Strain (CARDIA)    Difficulty of Paying Living Expenses: Not very hard  Food Insecurity: No Food Insecurity (06/11/2023)   Hunger Vital Sign    Worried About Running Out of Food in the Last Year: Never true    Ran  Out of Food in the Last Year: Never true  Transportation Needs: No Transportation Needs (06/11/2023)   PRAPARE - Administrator, Civil Service (Medical): No    Lack of Transportation (Non-Medical): No  Physical Activity: Insufficiently Active (06/11/2023)   Exercise Vital Sign    Days of Exercise per Week: 2 days    Minutes of Exercise per Session: 30 min  Stress: Stress Concern Present (06/11/2023)   [employer] of Occupational Health - Occupational Stress Questionnaire    Feeling of Stress : To some extent  Social Connections: Moderately Integrated (06/11/2023)   Social Connection and Isolation Panel [NHANES]    Frequency of Communication with Friends and Family: More than three times a week    Frequency of Social Gatherings with Friends and Family: Once a week    Attends Religious Services: 1 to 4 times per year    Active Member of [employer] or Organizations: Yes    Attends Banker Meetings: 1 to 4 times per year    Marital Status: Divorced  Intimate Partner Violence: Not At Risk (09/19/2022)   Humiliation, Afraid, Rape, and Kick questionnaire    Fear of Current or Ex-Partner: No    Emotionally Abused: No    Physically Abused: No    Sexually Abused: No    Family History  Adopted: Yes  Problem Relation Age of Onset   Hypertension Mother    Hyperlipidemia Mother     Past Surgical History:  Procedure Laterality Date   ATRIAL FIBRILLATION ABLATION  06/30/2017   ATRIAL FIBRILLATION ABLATION N/A  06/30/2017   Procedure: Atrial Fibrillation Ablation;  Surgeon: Hillis Range, MD;  Location: [facility];  Service: Cardiovascular;  Laterality: N/A;   BICEPT TENODESIS Right 05/24/2020   Procedure: BICEPS TENODESIS;  Surgeon: Cammy Copa, MD;  Location: [facility];  Service: Orthopedics;  Laterality: Right;   BIOPSY THYROID  1996   CARDIOVERSION N/A 12/07/2013   Procedure: CARDIOVERSION;  Surgeon: Everette Rank, MD;  Location: [facility] ENDOSCOPY;  Service: Cardiovascular;  Laterality: N/A;   CARDIOVERSION N/A 03/10/2016   Procedure: CARDIOVERSION;  Surgeon: Vesta Mixer, MD;  Location: [facility] ENDOSCOPY;  Service: Cardiovascular;  Laterality: N/A;   CORONARY ATHERECTOMY N/A 09/18/2022   Procedure: CORONARY ATHERECTOMY;  Surgeon: Tonny Bollman, MD;  Location: [facility] INVASIVE CV LAB;  Service: Cardiovascular;  Laterality: N/A;   CORONARY BALLOON ANGIOPLASTY N/A 06/24/2022   Procedure: CORONARY BALLOON ANGIOPLASTY;  Surgeon: Lyn Records, MD;  Location: [facility];  Service: Cardiovascular;  Laterality: N/A;   CORONARY STENT INTERVENTION N/A 09/18/2022   Procedure: CORONARY STENT INTERVENTION;  Surgeon: Tonny Bollman, MD;  Location: [facility] INVASIVE CV LAB;  Service: Cardiovascular;  Laterality: N/A;   ESOPHAGOGASTRODUODENOSCOPY N/A 12/07/2017   Procedure: ESOPHAGOGASTRODUODENOSCOPY (EGD);  Surgeon: Hilarie Fredrickson, MD;  Location: [facility] ENDOSCOPY;  Service: Endoscopy;  Laterality: N/A;   EYE MUSCLE SURGERY Right ~ 1966   LEFT HEART CATH AND CORONARY ANGIOGRAPHY N/A 06/24/2022   Procedure: LEFT HEART CATH AND CORONARY ANGIOGRAPHY;  Surgeon: Lyn Records, MD;  Location: [facility];  Service: Cardiovascular;  Laterality: N/A;   SHOULDER ARTHROSCOPY WITH ROTATOR CUFF REPAIR Right 05/24/2020   Procedure: right shoulder arthroscopy, rotator cuff tear repair biceps tenodesis;  Surgeon: Cammy Copa, MD;  Location:  SURGERY CENTER;  Service: Orthopedics;  Laterality:  Right;   TEE WITHOUT CARDIOVERSION N/A 06/29/2017   Procedure: TRANSESOPHAGEAL ECHOCARDIOGRAM (TEE);  Surgeon: Pricilla Riffle, MD;  Location: [facility] ENDOSCOPY;  Service: Cardiovascular;  Laterality: N/A;   TONSILLECTOMY  1971    ROS: Review of Systems Negative except as stated above  PHYSICAL EXAM: BP 114/70   Pulse (!) 59   Temp 98.4 F (36.9 C) (Oral)   Ht 5\' 5"  (1.651 m)   Wt 239 lb 3.2 oz (108.5 kg)   LMP 04/15/2015 (LMP Unknown) Comment: last in may  SpO2 96%   BMI 39.80 kg/m   Wt Readings from Last 3 Encounters:  06/11/23 239 lb 3.2 oz (108.5 kg)  05/29/23 235 lb (106.6 kg)  03/26/23 235 lb 6.4 oz (106.8 kg)    Physical Exam  General appearance - alert, well appearing, older Caucasian female and in no distress Mental status - normal mood, behavior, speech, dress, motor activity, and thought processes Neck - supple, no significant adenopathy Chest - clear to auscultation, no wheezes, rales or rhonchi, symmetric air entry Heart - normal rate, regular rhythm, normal S1, S2, no murmurs, rubs, clicks or gallops Extremities -no edema of the left lower leg.  Right lower leg is in a cast.  She has crutches.      Latest Ref Rng & Units 04/08/2023    8:16 AM 03/26/2023   11:17 AM 10/09/2022    8:29 AM  CMP  Glucose 70 - 99 mg/dL 784  696  295   BUN 8 - 27 mg/dL 10  12  10    Creatinine 0.57 - 1.00 mg/dL 2.84  1.32  4.40   Sodium 134 - 144 mmol/L 137  140  140   Potassium 3.5 - 5.2 mmol/L 4.7  4.3  4.6   Chloride 96 - 106 mmol/L 102  105  102   CO2 20 - 29 mmol/L 21  22  25    Calcium 8.7 - 10.3 mg/dL 8.9  9.2  9.1   Total Protein 6.0 - 8.5 g/dL  6.5  6.6   Total Bilirubin 0.0 - 1.2 mg/dL  0.5  0.4   Alkaline Phos 44 - 121 IU/L  78  76   AST 0 - 40 IU/L  14  11   ALT 0 - 32 IU/L  21  21    Lipid Panel     Component Value Date/Time   CHOL 128 03/26/2023 1117   TRIG 146 03/26/2023 1117   HDL 36 (L) 03/26/2023 1117   CHOLHDL 3.6 03/26/2023 1117   CHOLHDL 6.8 06/25/2022  0514   VLDL 61 (H) 06/25/2022 0514   LDLCALC 66 03/26/2023 1117   LDLDIRECT 114.0 02/13/2015 0750    CBC    Component Value Date/Time   WBC 6.5 10/09/2022 0829   WBC 6.0 09/19/2022 0159   RBC 4.00 10/09/2022 0829   RBC 3.78 (L) 09/19/2022 0159   HGB 12.8 10/09/2022 0829   HCT 38.9 10/09/2022 0829   PLT 265 10/09/2022 0829   MCV 97 10/09/2022 0829   MCH 32.0 10/09/2022 0829   MCH 31.7 09/19/2022 0159   MCHC 32.9 10/09/2022 0829   MCHC 33.1 09/19/2022 0159   RDW 12.5 10/09/2022 0829   LYMPHSABS 2.0 06/22/2022 1504   LYMPHSABS 1.7 11/25/2017 1017   MONOABS 0.7 06/22/2022 1504   EOSABS 0.3 06/22/2022 1504   EOSABS 0.3 11/25/2017 1017   BASOSABS 0.1 06/22/2022 1504   BASOSABS 0.1 11/25/2017 1017    ASSESSMENT AND PLAN: 1. Type 2 diabetes mellitus with morbid obesity (HCC) -Advised patient that she can try sitting down on a stool to cook her food  until cast is removed from the right lower extremity.  Avoid fast foods which she has been doing a lot more of lately.  She expressed understanding. Continue metformin 1000 mg twice a day, Mounjaro once a week, Glucotrol XL 5 mg once a day and Semglee insulin 80 units as prescribed by her endocrinologist.  2. Hypertension associated with diabetes (HCC) At goal.  Continue lisinopril 10 mg daily, isosorbide 30 mg daily, spironolactone 25 mg daily  3. Chronic systolic congestive heart failure (HCC) Stable and compensated.  Continue lisinopril, isosorbide, metoprolol 25 mg daily and spironolactone 25 mg daily  4. Atrial flutter, paroxysmal (HCC) Continue Xarelto and metoprolol  5. Coronary artery disease involving native coronary artery of native heart without angina pectoris Stable.  Continue isosorbide, atorvastatin 80 mg daily, Plavix 75 mg daily, metoprolol XL 25 mg daily  6. Screening for lung cancer - CT CHEST LUNG CA SCREEN LOW DOSE W/O CM; Future    Patient was given the opportunity to ask questions.  Patient verbalized  understanding of the plan and was able to repeat key elements of the plan.   This documentation was completed using Paediatric nurse.  Any transcriptional errors are unintentional.  Orders Placed This Encounter  Procedures   CT CHEST LUNG CA SCREEN LOW DOSE W/O CM     Requested Prescriptions    No prescriptions requested or ordered in this encounter    Return in about 4 months (around 10/12/2023).  Jonah Blue, MD, FACP

## 2023-06-18 ENCOUNTER — Other Ambulatory Visit: Payer: Self-pay | Admitting: Internal Medicine

## 2023-06-18 DIAGNOSIS — E669 Obesity, unspecified: Secondary | ICD-10-CM

## 2023-06-23 ENCOUNTER — Other Ambulatory Visit: Payer: Self-pay | Admitting: "Endocrinology

## 2023-07-01 ENCOUNTER — Other Ambulatory Visit: Payer: Self-pay | Admitting: Cardiovascular Disease

## 2023-07-10 ENCOUNTER — Other Ambulatory Visit: Payer: Self-pay | Admitting: Cardiovascular Disease

## 2023-07-13 ENCOUNTER — Encounter: Payer: Self-pay | Admitting: "Endocrinology

## 2023-07-13 ENCOUNTER — Ambulatory Visit: Payer: BC Managed Care – PPO | Admitting: "Endocrinology

## 2023-07-13 VITALS — BP 110/76 | HR 72 | Ht 65.0 in | Wt 237.8 lb

## 2023-07-13 DIAGNOSIS — E782 Mixed hyperlipidemia: Secondary | ICD-10-CM

## 2023-07-13 DIAGNOSIS — Z794 Long term (current) use of insulin: Secondary | ICD-10-CM

## 2023-07-13 DIAGNOSIS — E1159 Type 2 diabetes mellitus with other circulatory complications: Secondary | ICD-10-CM | POA: Diagnosis not present

## 2023-07-13 DIAGNOSIS — I10 Essential (primary) hypertension: Secondary | ICD-10-CM

## 2023-07-13 DIAGNOSIS — Z6837 Body mass index (BMI) 37.0-37.9, adult: Secondary | ICD-10-CM

## 2023-07-13 LAB — POCT GLYCOSYLATED HEMOGLOBIN (HGB A1C): HbA1c, POC (controlled diabetic range): 7.6 % — AB (ref 0.0–7.0)

## 2023-07-13 MED ORDER — TIRZEPATIDE 10 MG/0.5ML ~~LOC~~ SOAJ: 10.0000 mg | SUBCUTANEOUS | 1 refills | Status: DC

## 2023-07-13 NOTE — Patient Instructions (Signed)

## 2023-07-13 NOTE — Progress Notes (Signed)
07/13/2023, 4:13 PM  Endocrinology follow-up note   Subjective:    Patient ID: Kelly Lang, female    DOB: [birth year].  Kelly Lang is being seen in follow-up after she was seen in consultation for management of currently uncontrolled symptomatic diabetes requested by  Marcine Matar, MD.   Past Medical History:  Diagnosis Date   Arthritis    "all over" (06/30/2017)   Chicken pox    Dyslipidemia    Dysrhythmia ablation 2018   a-fib   GERD (gastroesophageal reflux disease)    H/O: hypothyroidism    a. thyroid biopsy 1996 with synthroid. Stopped taking rx and was loss to follow up b. normal thyroid function 10/20/13   High cholesterol    Hx of cardiovascular stress test    ETT/Lexiscan Myoview (12/2013):  No ischemia; not gated; low risk.   Hypertension    Obesity    Persistent atrial fibrillation (HCC)    a diagnosed 10/25/2013   Psoriasis    on Skyrizi   Seasonal allergies    Stroke [facility]) 2016   "some speech problems since" (06/30/2017)   Tachycardia induced cardiomyopathy (HCC)    a. 2/2 Afib with RVR for unknown duration (EF: 40-45%)   Thyroid disease    Type II diabetes mellitus (HCC)    a. hg A1c 10, newly diagnosed (10/26/13)    Past Surgical History:  Procedure Laterality Date   ATRIAL FIBRILLATION ABLATION  06/30/2017   ATRIAL FIBRILLATION ABLATION N/A 06/30/2017   Procedure: Atrial Fibrillation Ablation;  Surgeon: Hillis Range, MD;  Location: [facility];  Service: Cardiovascular;  Laterality: N/A;   BICEPT TENODESIS Right 05/24/2020   Procedure: BICEPS TENODESIS;  Surgeon: Cammy Copa, MD;  Location: [facility];  Service: Orthopedics;  Laterality: Right;   BIOPSY THYROID  1996   CARDIOVERSION N/A 12/07/2013   Procedure: CARDIOVERSION;  Surgeon: Everette Rank, MD;  Location: [facility] ENDOSCOPY;  Service: Cardiovascular;  Laterality: N/A;    CARDIOVERSION N/A 03/10/2016   Procedure: CARDIOVERSION;  Surgeon: Vesta Mixer, MD;  Location: [facility] ENDOSCOPY;  Service: Cardiovascular;  Laterality: N/A;   CORONARY ATHERECTOMY N/A 09/18/2022   Procedure: CORONARY ATHERECTOMY;  Surgeon: Tonny Bollman, MD;  Location: [facility] INVASIVE CV LAB;  Service: Cardiovascular;  Laterality: N/A;   CORONARY BALLOON ANGIOPLASTY N/A 06/24/2022   Procedure: CORONARY BALLOON ANGIOPLASTY;  Surgeon: Lyn Records, MD;  Location: [facility];  Service: Cardiovascular;  Laterality: N/A;   CORONARY STENT INTERVENTION N/A 09/18/2022   Procedure: CORONARY STENT INTERVENTION;  Surgeon: Tonny Bollman, MD;  Location[facility] INVASIVE CV LAB;  Service: Cardiovascular;  Laterality: N/A;   ESOPHAGOGASTRODUODENOSCOPY N/A 12/07/2017   Procedure: ESOPHAGOGASTRODUODENOSCOPY (EGD);  Surgeon: Hilarie Fredrickson, MD;  Location: [facility] ENDOSCOPY;  Service: Endoscopy;  Laterality: N/A;   EYE MUSCLE SURGERY Right ~ 1966   LEFT HEART CATH AND CORONARY ANGIOGRAPHY N/A 06/24/2022   Procedure: LEFT HEART CATH AND CORONARY ANGIOGRAPHY;  Surgeon: Lyn Records, MD;  Location: [facility];  Service: Cardiovascular;  Laterality: N/A;   SHOULDER ARTHROSCOPY WITH ROTATOR CUFF REPAIR Right 05/24/2020   Procedure: right  shoulder arthroscopy, rotator cuff tear repair biceps tenodesis;  Surgeon: Cammy Copa, MD;  Location: [facility];  Service: Orthopedics;  Laterality: Right;   TEE WITHOUT CARDIOVERSION N/A 06/29/2017   Procedure: TRANSESOPHAGEAL ECHOCARDIOGRAM (TEE);  Surgeon: Pricilla Riffle, MD;  Location: [facility] ENDOSCOPY;  Service: Cardiovascular;  Laterality: N/A;   TONSILLECTOMY  1971    Social History   Socioeconomic History   Marital status: Divorced    Spouse name: Not on file   Number of children: 0   Years of education: 14   Highest education level: Some college, no degree  Occupational History   Occupation: Designer, television/film set  Tobacco Use   Smoking status: Former     Current packs/day: 0.00    Average packs/day: 1 pack/day for 22.0 years (22.0 ttl pk-yrs)    Types: Cigarettes    Start date: 12/01/1988    Quit date: 12/01/2010    Years since quitting: 12.6   Smokeless tobacco: Never  Vaping Use   Vaping status: Never Used  Substance and Sexual Activity   Alcohol use: Yes    Comment: social   Drug use: No   Sexual activity: Not Currently  Other Topics Concern   Not on file  Social History Narrative   Moved to [place] from [place] in 2002.     Fun: shop, sew, decorate   Denies any beliefs effecting healthcare   Social Determinants of Health   Financial Resource Strain: Low Risk  (06/11/2023)   Overall Financial Resource Strain (CARDIA)    Difficulty of Paying Living Expenses: Not very hard  Food Insecurity: No Food Insecurity (06/11/2023)   Hunger Vital Sign    Worried About Running Out of Food in the Last Year: Never true    Ran Out of Food in the Last Year: Never true  Transportation Needs: No Transportation Needs (06/11/2023)   PRAPARE - Administrator, Civil Service (Medical): No    Lack of Transportation (Non-Medical): No  Physical Activity: Insufficiently Active (06/11/2023)   Exercise Vital Sign    Days of Exercise per Week: 2 days    Minutes of Exercise per Session: 30 min  Stress: Stress Concern Present (06/11/2023)   [employer] of Occupational Health - Occupational Stress Questionnaire    Feeling of Stress : To some extent  Social Connections: Moderately Integrated (06/11/2023)   Social Connection and Isolation Panel [NHANES]    Frequency of Communication with Friends and Family: More than three times a week    Frequency of Social Gatherings with Friends and Family: Once a week    Attends Religious Services: 1 to 4 times per year    Active Member of [employer] or Organizations: Yes    Attends Banker Meetings: 1 to 4 times per year    Marital Status: Divorced    Family History  Adopted: Yes   Problem Relation Age of Onset   Hypertension Mother    Hyperlipidemia Mother     Outpatient Encounter Medications as of 07/13/2023  Medication Sig   tirzepatide (MOUNJARO) 10 MG/0.5ML Pen Inject 10 mg into the skin once a week.   acetaminophen (TYLENOL) 500 MG tablet Take 1,000 mg by mouth every 6 (six) hours as needed for headache.   atorvastatin (LIPITOR) 80 MG tablet Take 1 tablet by mouth once daily   clopidogrel (PLAVIX) 75 MG tablet Take 1 tablet (75 mg total) by mouth daily with breakfast.   Continuous Blood Gluc Receiver (DEXCOM G7 RECEIVER)  DEVI Use to monitor BG continuously   Continuous Glucose Sensor (DEXCOM G7 SENSOR) MISC USE AS DIRECTED CHANGE  EVERY  10  DAYS   diclofenac Sodium (VOLTAREN) 1 % GEL Apply 2 g topically 4 (four) times daily.   DULoxetine (CYMBALTA) 30 MG capsule Take 1 capsule by mouth once daily   HYDROcodone-acetaminophen (NORCO/VICODIN) 5-325 MG tablet Take 1 tablet by mouth every 4 (four) hours as needed. (Patient not taking: Reported on 06/11/2023)   insulin glargine-yfgn (SEMGLEE, YFGN,) 100 UNIT/ML Pen INJECT 80 UNITS SUBCUTANEOUSLY AT BEDTIME   isosorbide mononitrate (IMDUR) 30 MG 24 hr tablet Take 1 tablet by mouth once daily   lisinopril (ZESTRIL) 10 MG tablet Take 1 tablet by mouth once daily   metFORMIN (GLUCOPHAGE) 1000 MG tablet TAKE 1 TABLET BY MOUTH TWICE DAILY WITH A MEAL   metoCLOPramide (REGLAN) 10 MG tablet Take 1 tablet (10 mg total) by mouth every 6 (six) hours.   metoprolol succinate (TOPROL-XL) 25 MG 24 hr tablet Take 3 tablets by mouth once daily   ONETOUCH DELICA LANCETS 33G MISC Use as instructed to check blood sugar up to 3 times daily.   ONETOUCH VERIO test strip USE STRIPS TO CHECK GLUCOSE UP TO THREE TIMES DAILY AS DIRECTED   pantoprazole (PROTONIX) 40 MG tablet Take 1 tablet (40 mg total) by mouth daily.   rivaroxaban (XARELTO) 20 MG TABS tablet Take 1 tablet (20 mg total) by mouth daily with supper.   SKYRIZI PEN 150 MG/ML SOAJ  Inject 150 mg into the skin every 3 (three) months.   spironolactone (ALDACTONE) 25 MG tablet Take 0.5 tablets (12.5 mg total) by mouth daily.   TRUEPLUS PEN NEEDLES 32G X 4 MM MISC USE AS DIRECTED AT BEDTIME.   [DISCONTINUED] glipiZIDE (GLUCOTROL XL) 5 MG 24 hr tablet Take 1 tablet by mouth once daily with breakfast   [DISCONTINUED] tirzepatide (MOUNJARO) 7.5 MG/0.5ML Pen Inject 7.5 mg into the skin once a week.   No facility-administered encounter medications on file as of 07/13/2023.    ALLERGIES: Allergies  Allergen Reactions   Jardiance [Empagliflozin] Other (See Comments)    Yeast infections   Codeine Itching and Rash    VACCINATION STATUS: Immunization History  Administered Date(s) Administered   Influenza,inj,Quad PF,6+ Mos 11/19/2016, 09/07/2017, 08/09/2018, 08/16/2020, 08/23/2021, 09/08/2022   Moderna Covid-19 Vaccine Bivalent Booster 33yrs & up 09/10/2022   Moderna Sars-Covid-2 Vaccination 01/04/2020, 02/01/2020, 10/12/2020, 06/09/2021   Pneumococcal Polysaccharide-23 06/16/2016   Tdap 06/16/2016   Zoster Recombinant(Shingrix) 07/14/2022, 10/13/2022    Diabetes She presents for her follow-up diabetic visit. She has type 2 diabetes mellitus. Onset time: She was diagnosed at approximate age of 50 years. Her disease course has been improving. There are no hypoglycemic associated symptoms. Pertinent negatives for hypoglycemia include no confusion, headaches, pallor or seizures. Pertinent negatives for diabetes include no chest pain, no fatigue, no polydipsia, no polyphagia and no polyuria. There are no hypoglycemic complications. Symptoms are worsening. Diabetic complications include a CVA and heart disease. Risk factors for coronary artery disease include dyslipidemia, diabetes mellitus, obesity, hypertension, post-menopausal, sedentary lifestyle and tobacco exposure. Current diabetic treatment includes insulin injections (She is currently on Semglee 80 units nightly, Ozempic 2 mg  weekly.  Glipizide 5 mg daily.  Metformin 1000 mg p.o. twice daily.). Her weight is fluctuating minimally. She is following a generally unhealthy diet. When asked about meal planning, she reported none. She has had a previous visit with a dietitian. She participates in exercise intermittently. Her home blood  glucose trend is decreasing steadily. Her breakfast blood glucose range is generally 180-200 mg/dl. Her lunch blood glucose range is generally 180-200 mg/dl. Her dinner blood glucose range is generally 180-200 mg/dl. Her bedtime blood glucose range is generally 180-200 mg/dl. Her overall blood glucose range is 180-200 mg/dl. Harriett Sine presents with improving but still slightly above target glycemic profile.  Her CGM average shows 53% in range, 47% above range.  She does not have hypoglycemia.  Her point-of-care A1c is 7.6%, progressively improving.  Her recent glycemic profile average is 180 mg per DL. ) An ACE inhibitor/angiotensin II receptor blocker is being taken. Eye exam is current.  Hypertension This is a chronic problem. The current episode started more than 1 year ago. The problem is controlled. Pertinent negatives include no chest pain, headaches, palpitations or shortness of breath. Risk factors for coronary artery disease include diabetes mellitus, dyslipidemia, obesity, sedentary lifestyle, smoking/tobacco exposure and post-menopausal state. Past treatments include ACE inhibitors and beta blockers. Hypertensive end-organ damage includes CVA.  Hyperlipidemia This is a chronic problem. The current episode started more than 1 year ago. The problem is uncontrolled. Exacerbating diseases include diabetes and obesity. Pertinent negatives include no chest pain, myalgias or shortness of breath. Risk factors for coronary artery disease include dyslipidemia, diabetes mellitus, hypertension, obesity, a sedentary lifestyle and post-menopausal.    Review of Systems  Constitutional:  Negative for chills,  fatigue, fever and unexpected weight change.  HENT:  Negative for trouble swallowing and voice change.   Eyes:  Negative for visual disturbance.  Respiratory:  Negative for cough, shortness of breath and wheezing.   Cardiovascular:  Negative for chest pain, palpitations and leg swelling.  Gastrointestinal:  Negative for diarrhea, nausea and vomiting.  Endocrine: Negative for cold intolerance, heat intolerance, polydipsia, polyphagia and polyuria.  Musculoskeletal:  Negative for arthralgias and myalgias.  Skin:  Negative for color change, pallor, rash and wound.  Neurological:  Negative for seizures and headaches.  Psychiatric/Behavioral:  Negative for confusion and suicidal ideas.     Objective:       07/13/2023    3:41 PM 06/11/2023    2:10 PM 05/29/2023   11:33 PM  Vitals with BMI  Height 5\' 5"  5\' 5"    Weight 237 lbs 13 oz 239 lbs 3 oz   BMI 39.57 39.8   Systolic 110 114 161  Diastolic 76 70 69  Pulse 72 59 78    BP 110/76   Pulse 72   Ht 5\' 5"  (1.651 m)   Wt 237 lb 12.8 oz (107.9 kg)   LMP 04/15/2015 (LMP Unknown) Comment: last in may  BMI 39.57 kg/m   Wt Readings from Last 3 Encounters:  07/13/23 237 lb 12.8 oz (107.9 kg)  06/11/23 239 lb 3.2 oz (108.5 kg)  05/29/23 235 lb (106.6 kg)     CMP     Component Value Date/Time   NA 137 07/10/2023 0842   K 4.4 07/10/2023 0842   CL 101 07/10/2023 0842   CO2 23 07/10/2023 0842   GLUCOSE 133 (H) 07/10/2023 0842   GLUCOSE 111 (H) 09/19/2022 0159   BUN 12 07/10/2023 0842   CREATININE 0.71 07/10/2023 0842   CREATININE 0.90 06/16/2016 1128   CALCIUM 9.2 07/10/2023 0842   PROT 6.8 07/10/2023 0842   ALBUMIN 4.0 07/10/2023 0842   AST 12 07/10/2023 0842   ALT 19 07/10/2023 0842   ALKPHOS 75 07/10/2023 0842   BILITOT 0.4 07/10/2023 0842   GFRNONAA >60 09/19/2022 0159  GFRNONAA 73 06/16/2016 1128   GFRAA 118 12/25/2020 0802   GFRAA 84 06/16/2016 1128     Diabetic Labs (most recent): Lab Results  Component Value  Date   HGBA1C 7.6 (A) 07/13/2023   HGBA1C 7.8 (A) 03/04/2023   HGBA1C 7.5 (A) 01/09/2023   MICROALBUR 150 09/24/2020   MICROALBUR 6.2 12/26/2015   MICROALBUR 2.9 (H) 11/13/2014     Lipid Panel ( most recent) Lipid Panel     Component Value Date/Time   CHOL 125 07/10/2023 0842   TRIG 131 07/10/2023 0842   HDL 36 (L) 07/10/2023 0842   CHOLHDL 3.5 07/10/2023 0842   CHOLHDL 6.8 06/25/2022 0514   VLDL 61 (H) 06/25/2022 0514   LDLCALC 66 07/10/2023 0842   LDLDIRECT 114.0 02/13/2015 0750   LABVLDL 23 07/10/2023 0842      Lab Results  Component Value Date   TSH 0.597 12/25/2020   TSH 1.01 09/16/2019   TSH 0.718 12/26/2015   TSH 0.950 09/15/2015   TSH 0.95 11/13/2014   TSH 1.020 10/25/2013   FREET4 0.99 12/25/2020   FREET4 0.98 12/26/2015   FREET4 0.98 10/25/2013      Assessment & Plan:   1. DM type 2 causing vascular disease (HCC) - Shina Junes has currently uncontrolled symptomatic type 2 DM since  62 years of age.  Ailene presents with improving but still slightly above target glycemic profile.  Her CGM average shows 53% in range, 47% above range.  She does not have hypoglycemia.  Her point-of-care A1c is 7.6%, progressively improving.  Her recent glycemic profile average is 180 mg per DL.  - I had a long discussion with her about the progressive nature of diabetes and the pathology behind its complications. -her diabetes is complicated by CVA, coronary artery disease now status post stent placement, peripheral neuropathy and she remains at a high risk for more acute and chronic complications which include CAD, CVA, CKD, retinopathy, and neuropathy. These are all discussed in detail with her.  - I have counseled her on diet  and weight management  by adopting a carbohydrate restricted/protein rich diet. Patient is encouraged to switch to  unprocessed or minimally processed     complex starch and increased protein intake (animal or plant source), fruits, and vegetables. -   she is advised to stick to a routine mealtimes to eat 3 meals  a day and avoid unnecessary snacks ( to snack only to correct hypoglycemia).   - she acknowledges that there is a room for improvement in her food and drink choices. - Suggestion is made for her to avoid simple carbohydrates  from her diet including Cakes, Sweet Desserts, Ice Cream, Soda (diet and regular), Sweet Tea, Candies, Chips, Cookies, Store Bought Juices, Alcohol , Artificial Sweeteners,  Coffee Creamer, and "Sugar-free" Products, Lemonade. This will help patient to have more stable blood glucose profile and potentially avoid unintended weight gain.  The following Lifestyle Medicine recommendations according to American College of Lifestyle Medicine  [facility]) were discussed and and offered to patient and she  agrees to start the journey:  A. Whole Foods, Plant-Based Nutrition comprising of fruits and vegetables, plant-based proteins, whole-grain carbohydrates was discussed in detail with the patient.   A list for source of those nutrients were also provided to the patient.  Patient will use only water or unsweetened tea for hydration. B.  The need to stay away from risky substances including alcohol, smoking; obtaining 7 to 9 hours of restorative sleep, at  least 150 minutes of moderate intensity exercise weekly, the importance of healthy social connections,  and stress management techniques were discussed. C.  A full color page of  Calorie density of various food groups per pound showing examples of each food groups was provided to the patient.   - she reports that she has had enough encounters with dietitian in the past.    - I have approached her with the following individualized plan to manage  her diabetes and patient agrees:   -She has not engaged in lifestyle medicine optimally.  However, she has made significant changes and engaging better with her medication compliance.   -She will not need prandial insulin treatment for  now. -She is advised to continue Semglee 80 units nightly, discussed and increase her Mounjaro to 10 mg subcutaneously weekly.  She is advised to discontinue glipizide at this time.  She is advised to continue metformin 1000 mg p.o. daily.  She does not tolerate SGLT2 inhibitors.   - she is encouraged to call clinic for blood glucose levels less than 70 or above 200 mg /dl.   - Specific targets for  A1c;  LDL, HDL,  and Triglycerides were discussed with the patient.  2) Blood Pressure /Hypertension:  -Her blood pressure is controlled to target.  She has a chance to minimize her blood pressure medications on subsequent visits.  The above recommended plant dominant whole food approach will address hypertension, hyperlipidemia as well.   she is advised to continue her current medications including lisinopril 10 mg p.o. daily, metoprolol 25 mg p.o. twice daily.   3) Lipids/Hyperlipidemia:   Review of her recent lipid panel showed improved LDL at 66.  She is advised to continue atorvastatin 40 mg p.o. nightly.       Side effects and precautions discussed with her.     4)  Weight/Diet:  Body mass index is 39.57 kg/m.  -   clearly complicating her diabetes care.   she is  a candidate for weight loss. I discussed with her the fact that loss of 5 - 10% of her  current body weight will have the most impact on her diabetes management.  Exercise, and detailed carbohydrates information provided  -  detailed on discharge instructions.   5) Chronic Care/Health Maintenance:  -she  is on ACEI/ARB and Statin medications and  is encouraged to initiate and continue to follow up with Ophthalmology, Dentist,  Podiatrist at least yearly or according to recommendations, and advised to   stay away from smoking. I have recommended yearly flu vaccine and pneumonia vaccine at least every 5 years; moderate intensity exercise for up to 150 minutes weekly; and  sleep for at least 7 hours a day.  Her diabetes foot exam  today is significant for partial  neuropathy.   - she is  advised to maintain close follow up with Marcine Matar, MD for primary care needs, as well as her other providers for optimal and coordinated care.   I spent  26  minutes in the care of the patient today including review of labs from CMP, Lipids, Thyroid Function, Hematology (current and previous including abstractions from other facilities); face-to-face time discussing  her blood glucose readings/logs, discussing hypoglycemia and hyperglycemia episodes and symptoms, medications doses, her options of short and long term treatment based on the latest standards of care / guidelines;  discussion about incorporating lifestyle medicine;  and documenting the encounter. Risk reduction counseling performed per USPSTF guidelines to reduce  obesity and cardiovascular risk factors.     Please refer to Patient Instructions for Blood Glucose Monitoring and Insulin/Medications Dosing Guide"  in media tab for additional information. Please  also refer to " Patient Self Inventory" in the Media  tab for reviewed elements of pertinent patient history.  Kelly Lang participated in the discussions, expressed understanding, and voiced agreement with the above plans.  All questions were answered to her satisfaction. she is encouraged to contact clinic should she have any questions or concerns prior to her return visit.    Follow up plan: - Return in about 4 months (around 11/12/2023) for Bring Meter/CGM Device/Logs- A1c in Office.  Marquis Lunch, MD [facility] [address] [place], [place] [address] Phone: [phone]  Fax: [phone]    07/13/2023, 4:13 PM  This note was partially dictated with voice recognition software. Similar sounding words can be transcribed inadequately or may not  be corrected upon review.

## 2023-07-16 ENCOUNTER — Other Ambulatory Visit: Payer: Self-pay | Admitting: "Endocrinology

## 2023-07-22 ENCOUNTER — Other Ambulatory Visit: Payer: Self-pay | Admitting: Cardiovascular Disease

## 2023-07-25 ENCOUNTER — Other Ambulatory Visit: Payer: Self-pay | Admitting: "Endocrinology

## 2023-07-25 DIAGNOSIS — E1142 Type 2 diabetes mellitus with diabetic polyneuropathy: Secondary | ICD-10-CM

## 2023-07-30 ENCOUNTER — Other Ambulatory Visit: Payer: Self-pay | Admitting: Cardiovascular Disease

## 2023-07-30 DIAGNOSIS — I4891 Unspecified atrial fibrillation: Secondary | ICD-10-CM

## 2023-07-30 NOTE — Telephone Encounter (Signed)
Prescription refill request for Xarelto received.   Indication: afib  Last office visit: 03/26/2023, [place] Weight: 107.9 kg  Age: 62 yo  Scr: 0.71, 07/10/2023 CrCl: 144 ml/min   Refill sent.

## 2023-08-29 ENCOUNTER — Other Ambulatory Visit: Payer: Self-pay | Admitting: "Endocrinology

## 2023-09-06 ENCOUNTER — Encounter: Payer: Self-pay | Admitting: Cardiovascular Disease

## 2023-09-06 NOTE — Progress Notes (Signed)
Cardiology Office Note   Date:  09/06/2023   ID:  Kelly Lang, DOB Jul 09, 1961, MRN [id number]  PCP:  Marcine Matar, MD  Cardiologist:   Kristeen Miss, MD   Chief Complaint  Patient presents with   Coronary Artery Disease        Atrial Fibrillation        1. Paroxysmal Atrial fibrillation 2. Hypothyroidism 3. Type 2 diabetes mellitus 4. Chronic systolic CHF    Kelly Lang is a 62 y.o. female with a hx of T2DM, HL, thyroid disease and atrial fibrillation.  She had been on Synthroid for some time after a thyroid bx in 1996.  She was admitted 10/2013 with AFib with RVR.  Echo (10/26/13):  EF 40-45%, normal wall motion, mild LAE.  CHADS2-VASc= 3 (LV dysfunction, DM, female).  She was placed on coumadin.  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  She was last seen 11/29/2013.  She was set up for DCCV after 3-4 weeks of appropriate anticoagulation.  DCCV was performed 12/07/2013 but was unsuccessful.  She returns for follow up.  She is stable. She is frustrated with her atrial fibrillation. She does get tired more easily. She denies chest pain. She denies significant dyspnea. She is NYHA class II. She denies syncope. She denies orthopnea, PND, edema, palpitations.  Feb. 16, 2015:  She had a cardioversion twice.  Converted back to Afib both times. She has had a stress myoview which was negative for ischemia.    we will start flecainide today.  February 15, 2014:  Kelly Lang returns for further evaluation of her atrial fib.  She has been on Flecainide 100 BID for a month.  She started feeling better about 2 weeks ago.  She has converted to NSR.   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    Sept. 16, 2015:  Kelly Lang is doing well.  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     February 13, 2015:  Kelly Lang is a 62 y.o. female who presents for follow-up of atrial fibrillation. Is on coumadin . INR is 2.0  this am.   No CP or dyspnea.   Glucose levels are ok.   HbA1C is a bit high . Was started on Tragenta. Only a little bit of exercise.   Missed 4 days of flecainide due to insurance not paying for it. Staying in NSR  Nov. 29, 2016:  Had a CVA in Oct, 2016.  Had not been on her coumadin and all of her medications.  Echo revealed severe LV dysfunction with EF of 20-25%.   Was started on Xarelto and metoprolol at that time   Has had some some chills and achy feeling for the past couple of days. No cough, no fever, no runny nose.  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     February 08, 2016:  Kelly Lang has been back on her meds.   She had stopped her medications and had developed worsening congestive heart failure. Previous echocardiogram revealed an EF of 25%. She restart her medications and the recent echo 2 days ago reveals recovery of her left ventricular systolic function to an ejection fraction 45% which is her normal.  She's feeling quite a bit better. She has taken flecainide in  the past which helped her stay in normal sinus rhythm. She stopped the flecainide due to lack of insurance. She now has [employer] and wants to get back on flecainide.  March 07, 2016:  Kelly Lang is seen back today for follow-up of her atrial fibrillation. She's been on flecainide 100 mg twice a day.  She's had her stress test for flecainide testing and it revealed no QRS widening. She had an unsuccessful cardioversion Jan. 7, 2015 She cannot tell how much she is in NSR vs. Afib.   Oct. 9, 2017:  She is s/p cardioversion in April She is feeling better.  Able to walk without any dyspnea   May 18, 2017:  Kelly Lang is seen back today for follow up  No CP or dyspnea  Is now on Jardiance for the past week   Apr 12, 2018  Kelly Lang is seen for follow up visit  Has had an  ablation since I last  Feels so much better.    Able to walk but not getting any regular exercise  Still on Xarelto.  Body mass index is 39.88 kg/m.  October 05, 2019: Kelly Lang is seen today for follow-up visit.  He has a history of paroxysmal atrial fibrillation, obesity, hyperlipidemia and diabetes mellitus. Is not exercising much .  Working on getting diabetes regulated   Wt is 250 ( up 6 lbs)  Is s/p AFIB ablation    February 26, 2021:  Kelly Lang is seen today - hx of atrial fib, - s/p ablation Hx of HLD, DM, obesity Wt is 235 lbs   Chol levels are ok  Chol = 115 HDL = 32 LDL = 49 Trigs are 208  !!  Is trying to lose some weight   Mar 31, 2022: Kelly Lang is seen for follow  up Is maintaining  NSR   She still on Xarelto.  Weight today is 231 pounds.  She is down 19 pounds from 3 years ago.  Oct. 10, 2023 Kelly Lang is seen for follow up of her atrial fib, obesity, Was hospitalized in July with dyspnea and intrascapular pain  Early in the am Lasted for several hours  Went to work  Recurrent several days later,  went to [employer] [facility], then ultimately to [facility]    Aorta CTA : showed no dissection  Tropononis 106, 80  Heart catheterization on June 24, 2022 revealed 100% occlusion of the proximal to mid LAD.  This was a very heavily calcified segment.  She has right to left collaterals that fill the LAD in a retrograde fashion.  There is thrombus noted just distal to the high-grade obstruction.  The left circumflex and the right coronary artery are normal.  LV function was mildly reduced with an EF of 45 to 50%.  The proximal LAD lesion was crossed with a wire.  There was no balloon that was able to cross.  The plan was to anticoagulate for 4 to 6 weeks and then consider repeat procedure by the CTO team.  There was also consideration to perform a orbital atherectomy prior to stenting.   Still has the intrascapular pain with exertion , which is associated with dyspnea    Jan. 19, 2024 Kelly Lang had PCI of her prox LAD , complicated by distal embolization  Hx of PAF Is exercising only a little bit Is working quite a [employer] is 234 Still struggling with her weight. She does not have any acute problems.  Will have her see  Marcelino Duster, NP in 3 months for further counseling on diet, exercise, weight loss    Oct. 7, 2024 Kelly Lang is seen for follow up of her CAD, atrial fib Morbid obesity  Wt is        Past Medical History:  Diagnosis Date   Arthritis    "all over" (06/30/2017)   Chicken pox    Dyslipidemia    Dysrhythmia ablation 2018   a-fib   GERD (gastroesophageal reflux disease)    H/O: hypothyroidism    a. thyroid biopsy 1996 with synthroid. Stopped taking rx and was loss to follow up b. normal thyroid function 10/20/13   High cholesterol    Hx of cardiovascular stress test    ETT/Lexiscan Myoview (12/2013):  No ischemia; not gated; low risk.   Hypertension    Obesity    Persistent atrial fibrillation (HCC)    a diagnosed 10/25/2013   Psoriasis    on Skyrizi   Seasonal allergies    Stroke [facility]) 2016   "some speech problems since" (06/30/2017)   Tachycardia induced cardiomyopathy (HCC)    a. 2/2 Afib with RVR for unknown duration (EF: 40-45%)   Thyroid disease    Type II diabetes mellitus (HCC)    a. hg A1c 10, newly diagnosed (10/26/13)    Past Surgical History:  Procedure Laterality Date   ATRIAL FIBRILLATION ABLATION  06/30/2017   ATRIAL FIBRILLATION ABLATION N/A 06/30/2017   Procedure: Atrial Fibrillation Ablation;  Surgeon: Hillis Range, MD;  [facility]: [facility];  Service: Cardiovascular;  Laterality: N/A;   BICEPT TENODESIS Right 05/24/2020   Procedure: BICEPS TENODESIS;  Surgeon: Cammy Copa, MD;  Location: [facility];  Service: Orthopedics;  Laterality: Right;   BIOPSY THYROID  1996   CARDIOVERSION N/A 12/07/2013   Procedure: CARDIOVERSION;  Surgeon: Everette Rank, MD;  Location: [facility];   Service: Cardiovascular;  Laterality: N/A;   CARDIOVERSION N/A 03/10/2016   Procedure: CARDIOVERSION;  Surgeon: Vesta Mixer, MD;  Location: [facility] END[facility]COPY;  Service: Cardiovascular;  Laterality: N/A;   CORONARY ATHERECTOMY N/A 09/18/2022   Procedure: CORONARY ATHERECTOMY;  Surgeon: Tonny Bollman, MD;  Location: [facility] INVASIVE CV LAB;  Service: Cardiovascular;  Laterality: N/A;   CORONARY BALLOON ANGIOPLASTY N/A 06/24/2022   Procedure: CORONARY BALLOON ANGIOPLASTY;  Surgeon: Lyn Records, MD;  Location: [facility];  Service: Cardiovascular;  Laterality: N/A;   CORONARY STENT INTERVENTION N/A 09/18/2022   Procedure: CORONARY STENT INTERVENTION;  Surgeon: Tonny Bollman, MD;  Location: [facility] INVASIVE CV LAB;  Service: Cardiovascular;  Laterality: N/A;   ESOPHAGOGASTRODUODENOSCOPY N/A 12/07/2017   Procedure: ESOPHAGOGASTRODUODENOSCOPY (EGD);  Surgeon: Hilarie Fredrickson, MD;  Location: [facility] ENDOSCOPY;  Service: Endoscopy;  Laterality: N/A;   EYE MUSCLE SURGERY Right ~ 1966   LEFT HEART CATH AND CORONARY ANGIOGRAPHY N/A 06/24/2022   Procedure: LEFT HEART CATH AND CORONARY ANGIOGRAPHY;  Surgeon: Lyn Records, MD;  Location: [facility];  Service: Cardiovascular;  Laterality: N/A;   SHOULDER ARTHROSCOPY WITH ROTATOR CUFF REPAIR Right 05/24/2020   Procedure: right shoulder arthroscopy, rotator cuff tear repair biceps tenodesis;  Surgeon: Cammy Copa, MD;  Location: [facility];  Service: Orthopedics;  Laterality: Right;   TEE WITHOUT CARDIOVERSION N/A 06/29/2017   Procedure: TRANSESOPHAGEAL ECHOCARDIOGRAM (TEE);  Surgeon: Pricilla Riffle, MD;  Location: [facility]COPY;  Service: Cardiovascular;  Laterality: N/A;   TONSILLECTOMY  1971     Current Outpatient Medications  Medication Sig Dispense Refill   acetaminophen (  TYLENOL) 500 MG tablet Take 1,000 mg by mouth every 6 (six) hours as needed for headache.     atorvastatin (LIPITOR) 80 MG tablet Take 1 tablet by mouth once daily  90 tablet 3   clopidogrel (PLAVIX) 75 MG tablet Take 1 tablet by mouth once daily with breakfast 90 tablet 2   Continuous Blood Gluc Receiver (DEXCOM G7 RECEIVER) DEVI Use to monitor BG continuously 1 each 0   Continuous Glucose Sensor (DEXCOM G7 SENSOR) MISC USE AS DIRECTED CHANGE  EVERY  10  DAYS. 3 each 2   diclofenac Sodium (VOLTAREN) 1 % GEL Apply 2 g topically 4 (four) times daily. 100 g 2   DULoxetine (CYMBALTA) 30 MG capsule Take 1 capsule by mouth once daily 90 capsule 1   HYDROcodone-acetaminophen (NORCO/VICODIN) 5-325 MG tablet Take 1 tablet by mouth every 4 (four) hours as needed. (Patient not taking: Reported on 06/11/2023) 10 tablet 0   isosorbide mononitrate (IMDUR) 30 MG 24 hr tablet Take 1 tablet by mouth once daily 90 tablet 1   lisinopril (ZESTRIL) 10 MG tablet Take 1 tablet by mouth once daily 90 tablet 3   metFORMIN (GLUCOPHAGE) 1000 MG tablet TAKE 1 TABLET BY MOUTH TWICE DAILY WITH A MEAL 180 tablet 1   metoCLOPramide (REGLAN) 10 MG tablet Take 1 tablet (10 mg total) by mouth every 6 (six) hours. 30 tablet 0   metoprolol succinate (TOPROL-XL) 25 MG 24 hr tablet Take 3 tablets by mouth once daily 270 tablet 1   ONETOUCH DELICA LANCETS 33G MISC Use as instructed to check blood sugar up to 3 times daily. 100 each 11   ONETOUCH VERIO test strip USE STRIPS TO CHECK GLUCOSE UP TO THREE TIMES DAILY AS DIRECTED 100 each 6   pantoprazole (PROTONIX) 40 MG tablet Take 1 tablet (40 mg total) by mouth daily. 30 tablet 3   rivaroxaban (XARELTO) 20 MG TABS tablet TAKE 1 TABLET BY MOUTH ONCE DAILY WITH SUPPER 90 tablet 1   SEMGLEE, YFGN, 100 UNIT/ML Pen INJECT 80 UNITS  SUBCUTANEOUSLY AT BEDTIME 30 mL 0   SKYRIZI PEN 150 MG/ML SOAJ Inject 150 mg into the skin every 3 (three) months.     spironolactone (ALDACTONE) 25 MG tablet Take 0.5 tablets (12.5 mg total) by mouth daily. 45 tablet 3   tirzepatide (MOUNJARO) 10 MG/0.5ML Pen Inject 10 mg into the skin once a week. 6 mL 1   TRUEPLUS PEN  NEEDLES 32G X 4 MM MISC USE AS DIRECTED AT BEDTIME. 100 each 2   No current facility-administered medications for this visit.    Allergies:   Jardiance [empagliflozin] and Codeine    Social History:  The patient  reports that she quit smoking about 12 years ago. Her smoking use included cigarettes. She started smoking about 34 years ago. She has a 22 pack-year smoking history. She has never used smokeless tobacco. She reports current alcohol use. She reports that she does not use drugs.   Family History:  The patient's family history includes Hyperlipidemia in her mother; Hypertension in her mother. She was adopted.    Physical Exam: Last menstrual period 04/15/2015.  No BP recorded.  {Refresh Note OR Click here to enter BP  :1}***    GEN:  Well nourished, well developed in no acute distress HEENT: Normal NECK: No JVD; No carotid bruits LYMPHATICS: No lymphadenopathy CARDIAC: RRR ***, no murmurs, rubs, gallops RESPIRATORY:  Clear to auscultation without rales, wheezing or rhonchi  ABDOMEN: Soft, non-tender, non-distended MUSCULOSKELETAL:  No edema; No deformity  SKIN: Warm and dry NEUROLOGIC:  Alert and oriented x 3    EKG:            Recent Labs: 10/09/2022: Hemoglobin 12.8; Platelets 265 07/10/2023: ALT 19; BUN 12; Creatinine, Ser 0.71; Potassium 4.4; Sodium 137    Lipid Panel    Component Value Date/Time   CHOL 125 07/10/2023 0842   TRIG 131 07/10/2023 0842   HDL 36 (L) 07/10/2023 0842   CHOLHDL 3.5 07/10/2023 0842   CHOLHDL 6.8 06/25/2022 0514   VLDL 61 (H) 06/25/2022 0514   LDLCALC 66 07/10/2023 0842   LDLDIRECT 114.0 02/13/2015 0750      Wt Readings from Last 3 Encounters:  07/13/23 237 lb 12.8 oz (107.9 kg)  06/11/23 239 lb 3.2 oz (108.5 kg)  05/29/23 235 lb (106.6 kg)      Other studies Reviewed: Additional studies/ records that were reviewed today include: . Review of the above records demonstrates:    ASSESSMENT AND PLAN:  1. Atrial  fibrillation-      2. CAD :        3. Type 2 diabetes mellitus -  4.   Hyperlipidemia:   5.  Moderate obesity:         Current medicines are reviewed at length with the patient today.  The patient does not have concerns regarding medicines.  The following changes have been made:  no change  Labs/ tests ordered today include:   No orders of the defined types were placed in this encounter.   Disposition:    3 months with Eligha Bridegroom, NP Will see me in a year - sooner if needed.     Kristeen Miss, MD  09/06/2023 7:47 PM    [facility] [address] [place], [place], [place]  [address] Phone: [phone]; Fax: [phone]

## 2023-09-07 ENCOUNTER — Other Ambulatory Visit: Payer: Self-pay | Admitting: "Endocrinology

## 2023-09-07 ENCOUNTER — Ambulatory Visit: Payer: BC Managed Care – PPO | Attending: Cardiovascular Disease | Admitting: Cardiovascular Disease

## 2023-09-07 DIAGNOSIS — E1142 Type 2 diabetes mellitus with diabetic polyneuropathy: Secondary | ICD-10-CM

## 2023-09-08 ENCOUNTER — Encounter: Payer: Self-pay | Admitting: Cardiovascular Disease

## 2023-09-08 ENCOUNTER — Telehealth: Payer: Self-pay | Admitting: Cardiovascular Disease

## 2023-09-08 NOTE — Telephone Encounter (Signed)
Reached out to patient who apologizes for missing her appointment and states she must've forgotten it. Rescheduled for 09/28/23 at 1:40pm. Pt appreciative.

## 2023-09-08 NOTE — Telephone Encounter (Signed)
Patient is upset because she said she didn't receive a notification or nothing in regards to her appt that was no show. Patient stated that she would like to talk with Dr. Elease Hashimoto or nurse about this situation

## 2023-09-09 ENCOUNTER — Other Ambulatory Visit: Payer: Self-pay

## 2023-09-10 ENCOUNTER — Other Ambulatory Visit: Payer: Self-pay | Admitting: Internal Medicine

## 2023-09-10 DIAGNOSIS — M2550 Pain in unspecified joint: Secondary | ICD-10-CM

## 2023-09-10 MED ORDER — DULOXETINE HCL 30 MG PO CPEP
30.0000 mg | ORAL_CAPSULE | Freq: Every day | ORAL | 1 refills | Status: DC
Start: 2023-09-10 — End: 2023-10-12

## 2023-09-10 NOTE — Telephone Encounter (Signed)
Medication Refill - Medication:  DULoxetine (CYMBALTA) 30 MG capsule  *will be out of medication after today  Has the patient contacted their pharmacy?  Yes, states the pharmacy has been trying to request this medication since last week  Preferred Pharmacy (with phone number or street name):  [facility] [address], [place] - [address]  Phone: [phone] Fax: [phone]   Has the patient been seen for an appointment in the last year OR does the patient have an upcoming appointment? Yes. F/U with PCP scheduled for 11.11.24

## 2023-09-10 NOTE — Telephone Encounter (Signed)
Requested Prescriptions  Pending Prescriptions Disp Refills   DULoxetine (CYMBALTA) 30 MG capsule 90 capsule 1    Sig: Take 1 capsule (30 mg total) by mouth daily.     Psychiatry: Antidepressants - SNRI - duloxetine Passed - 09/10/2023  8:38 AM      Passed - Cr in normal range and within 360 days    Creat  Date Value Ref Range Status  06/16/2016 0.90 0.50 - 1.05 mg/dL Final    Comment:      For patients > or = 60 years of age: The upper reference limit for Creatinine is approximately 13% higher for people identified as African-American.      Creatinine, Ser  Date Value Ref Range Status  07/10/2023 0.71 0.57 - 1.00 mg/dL Final   Creatinine, POC  Date Value Ref Range Status  09/24/2020 300 mg/dL Final         Passed - eGFR is 30 or above and within 360 days    GFR, Est African American  Date Value Ref Range Status  06/16/2016 84 >=60 mL/min Final   GFR calc Af Amer  Date Value Ref Range Status  12/25/2020 118 >59 mL/min/1.73 Final    Comment:    **In accordance with recommendations from the NKF-ASN Task force,**   [facility] is in the process of updating its eGFR calculation to the   2021 CKD-EPI creatinine equation that estimates kidney function   without a race variable.    GFR, Est Non African American  Date Value Ref Range Status  06/16/2016 73 >=60 mL/min Final   GFR, Estimated  Date Value Ref Range Status  09/19/2022 >60 >60 mL/min Final    Comment:    (NOTE) Calculated using the CKD-EPI Creatinine Equation (2021)    GFR  Date Value Ref Range Status  02/13/2015 107.47 >60.00 mL/min Final   eGFR  Date Value Ref Range Status  07/10/2023 97 >59 mL/min/1.73 Final         Passed - Completed PHQ-2 or PHQ-9 in the last 360 days      Passed - Last BP in normal range    BP Readings from Last 1 Encounters:  07/13/23 110/76         Passed - Valid encounter within last 6 months    Recent Outpatient Visits           3 months ago Type 2 diabetes mellitus  with morbid obesity (HCC)   [place] [facility] Jonah Blue B, MD   7 months ago Pap smear for cervical cancer screening   [place] [facility] Jonah Blue B, MD   8 months ago 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[facility])   [place] [facility] Jonah Blue B, MD   1 year ago 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[facility])   [place] Marcine Matar, MD   1 year ago 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[facility])    [facility] [place], Marzella Schlein, [place]       Future Appointments             In 2 weeks Nahser, [name], MD [facility]   In 1 month Laural Benes, Binnie Rail, MD [facility]

## 2023-09-28 ENCOUNTER — Ambulatory Visit: Payer: BC Managed Care – PPO | Admitting: Cardiovascular Disease

## 2023-10-06 ENCOUNTER — Other Ambulatory Visit (HOSPITAL_COMMUNITY): Payer: Self-pay

## 2023-10-11 ENCOUNTER — Other Ambulatory Visit: Payer: Self-pay | Admitting: "Endocrinology

## 2023-10-11 DIAGNOSIS — E1142 Type 2 diabetes mellitus with diabetic polyneuropathy: Secondary | ICD-10-CM

## 2023-10-12 ENCOUNTER — Ambulatory Visit: Payer: BC Managed Care – PPO | Attending: Internal Medicine | Admitting: Internal Medicine

## 2023-10-12 DIAGNOSIS — F321 Major depressive disorder, single episode, moderate: Secondary | ICD-10-CM | POA: Diagnosis not present

## 2023-10-12 DIAGNOSIS — Z23 Encounter for immunization: Secondary | ICD-10-CM

## 2023-10-12 DIAGNOSIS — I152 Hypertension secondary to endocrine disorders: Secondary | ICD-10-CM

## 2023-10-12 DIAGNOSIS — E1169 Type 2 diabetes mellitus with other specified complication: Secondary | ICD-10-CM | POA: Diagnosis not present

## 2023-10-12 DIAGNOSIS — Z6379 Other stressful life events affecting family and household: Secondary | ICD-10-CM

## 2023-10-12 DIAGNOSIS — I2510 Atherosclerotic heart disease of native coronary artery without angina pectoris: Secondary | ICD-10-CM

## 2023-10-12 DIAGNOSIS — E1159 Type 2 diabetes mellitus with other circulatory complications: Secondary | ICD-10-CM | POA: Diagnosis not present

## 2023-10-12 DIAGNOSIS — Z7984 Long term (current) use of oral hypoglycemic drugs: Secondary | ICD-10-CM

## 2023-10-12 DIAGNOSIS — M2550 Pain in unspecified joint: Secondary | ICD-10-CM | POA: Insufficient documentation

## 2023-10-12 DIAGNOSIS — I4892 Unspecified atrial flutter: Secondary | ICD-10-CM

## 2023-10-12 LAB — POCT GLYCOSYLATED HEMOGLOBIN (HGB A1C): HbA1c, POC (controlled diabetic range): 8.3 % — AB (ref 0.0–7.0)

## 2023-10-12 LAB — GLUCOSE, POCT (MANUAL RESULT ENTRY): POC Glucose: 316 mg/dL — AB (ref 70–99)

## 2023-10-12 MED ORDER — DULOXETINE HCL 60 MG PO CPEP: 60.0000 mg | ORAL_CAPSULE | Freq: Every day | ORAL | 4 refills | Status: DC

## 2023-10-12 NOTE — Progress Notes (Signed)
Patient ID: Kelly Lang, female    DOB: Apr 17, 1961  MRN: [id number]  CC: Diabetes (DM f/u. Herold Harms dosage increase for Cymbalta - increased stress/Discuss covid vax/Yes to flu vax. )   Subjective: Kelly Lang is a 62 y.o. female who presents for chronic ds management. Her concerns today include:  Pt with hx of a.flutter s/p ablation 05/2017 on Xarelto, DM type 2 , gastroparesis, obesity, HL, HTN, chronic systolic CHF with EF 60-65% done to 45-50% 05/2022, CVA, polyarthalgia on Cymbalta, psoriasis, COVID infection 01/2020, relative B12 def.    HTN/CHF/CAD/A.flutter:  Confirms taking lisinopril 20 mg daily, metoprolol 75 mg daily, isosorbide 30 mg daily, Plavix 75 mg daily, atorvastatin 80 mg daily and Xarelto 20 mg daily  No bleeding.  Some bruising on abdomen from CGM sensor when she accidentally hit it against the door but normally no bruising.   No CP/SOB/LE edema  DM: Results for orders placed or performed in visit on 10/12/23  POCT glycosylated hemoglobin (Hb A1C)  Result Value Ref Range   Hemoglobin A1C     HbA1c POC (<> result, manual entry)     HbA1c, POC (prediabetic range)     HbA1c, POC (controlled diabetic range) 8.3 (A) 0.0 - 7.0 %  POCT glucose (manual entry)  Result Value Ref Range   POC Glucose 316 (A) 70 - 99 mg/dl  Saw Dr. Fransico Him 03/09/80.  Glucotrol D/C and Mounjaro increased to 10 mg, Semglee 80 units daily, metformin 1 g twice a day  Has CGM but just RF, off for 10 days prior.  BS in a.m 180-210.   Doing a little better with her eating habits.  Tolerating Mounjaro.  Weight down 9 pounds from when I last saw her in July.  Reports a lot of stress b/w work and dealing with financial stress from mom over past few mths.  Finds herself crying a lot.  Cymbalta works well but would like higher dose.  Does not want counseling at this time.  She listens to music to help decrease stress.  HM:  yes to flu and COVID booster Patient Active Problem List   Diagnosis Date  Noted   Polyarthralgia 10/12/2023   Insulin long-term use (HCC) 07/13/2023   PAF (paroxysmal atrial fibrillation) (HCC)    Class 2 obesity 06/25/2022   NSTEMI (non-ST elevated myocardial infarction) (HCC)    Thyroid nodule 06/22/2022   Trigger ring finger of left hand 02/05/2021   Trigger little finger of right hand 02/05/2021   Mixed hyperlipidemia 09/17/2020   Multinodular goiter 09/16/2019   Chronic pain disorder 08/09/2018   Diabetic polyneuropathy associated with type 2 diabetes mellitus (HCC) 08/09/2018   GI bleed 12/06/2017   Hepatic steatosis 12/04/2017   Cerebrovascular accident (CVA) due to 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)    Low back pain 04/03/2014   Vitamin D deficiency 01/20/2014   Essential hypertension, benign 01/20/2014   H/O: hypothyroidism    Class 2 severe obesity due to excess calories with serious comorbidity and body mass index (BMI) of 37.0 to 37.9 in adult [facility])    DM type 2 causing vascular disease (HCC)    Arthritis    Atrial fibrillation  10/25/2013     Current Outpatient Medications on File Prior to Visit  Medication Sig Dispense Refill   acetaminophen (TYLENOL) 500 MG tablet Take 1,000 mg by mouth every 6 (six) hours as needed for headache.     atorvastatin (LIPITOR) 80 MG tablet Take 1 tablet by mouth  once daily 90 tablet 3   clopidogrel (PLAVIX) 75 MG tablet Take 1 tablet by mouth once daily with breakfast 90 tablet 2   Continuous Blood Gluc Receiver (DEXCOM G7 RECEIVER) DEVI Use to monitor BG continuously 1 each 0   Continuous Glucose Sensor (DEXCOM G7 SENSOR) MISC USE AS DIRECTED CHANGE  EVERY  10  DAYS. 3 each 2   diclofenac Sodium (VOLTAREN) 1 % GEL Apply 2 g topically 4 (four) times daily. 100 g 2   isosorbide mononitrate (IMDUR) 30 MG 24 hr tablet Take 1 tablet by mouth once daily 90 tablet 1   lisinopril (ZESTRIL) 10 MG tablet Take 1 tablet by mouth once daily 90 tablet 3   metFORMIN (GLUCOPHAGE) 1000 MG tablet TAKE 1 TABLET BY  MOUTH TWICE DAILY WITH A MEAL 180 tablet 1   metoprolol succinate (TOPROL-XL) 25 MG 24 hr tablet Take 3 tablets by mouth once daily 270 tablet 1   ONETOUCH DELICA LANCETS 33G MISC Use as instructed to check blood sugar up to 3 times daily. 100 each 11   ONETOUCH VERIO test strip USE STRIPS TO CHECK GLUCOSE UP TO THREE TIMES DAILY AS DIRECTED 100 each 6   pantoprazole (PROTONIX) 40 MG tablet Take 1 tablet (40 mg total) by mouth daily. 30 tablet 3   rivaroxaban (XARELTO) 20 MG TABS tablet TAKE 1 TABLET BY MOUTH ONCE DAILY WITH SUPPER 90 tablet 1   SEMGLEE, YFGN, 100 UNIT/ML Pen INJECT 80 UNITS SUBCUTANEOUSLY AT BEDTIME 30 mL 0   SKYRIZI PEN 150 MG/ML SOAJ Inject 150 mg into the skin every 3 (three) months.     spironolactone (ALDACTONE) 25 MG tablet Take 0.5 tablets (12.5 mg total) by mouth daily. 45 tablet 3   tirzepatide (MOUNJARO) 10 MG/0.5ML Pen Inject 10 mg into the skin once a week. 6 mL 1   TRUEPLUS PEN NEEDLES 32G X 4 MM MISC USE AS DIRECTED AT BEDTIME. 100 each 2   No current facility-administered medications on file prior to visit.    Allergies  Allergen Reactions   Jardiance [Empagliflozin] Other (See Comments)    Yeast infections   Codeine Itching and Rash    Social History   Socioeconomic History   Marital status: Divorced    Spouse name: Not on file   Number of children: 0   Years of education: 14   Highest education level: Some college, no degree  Occupational History   Occupation: Designer, television/film set  Tobacco Use   Smoking status: Former    Current packs/day: 0.00    Average packs/day: 1 pack/day for 22.0 years (22.0 ttl pk-yrs)    Types: Cigarettes    Start date: 12/01/1988    Quit date: 12/01/2010    Years since quitting: 12.8   Smokeless tobacco: Never  Vaping Use   Vaping status: Never Used  Substance and Sexual Activity   Alcohol use: Yes    Comment: social   Drug use: No   Sexual activity: Not Currently  Other Topics Concern   Not on file  Social History  Narrative   Moved to [place] from [place] in 2002.     Fun: shop, sew, decorate   Denies any beliefs effecting healthcare   Social Determinants of Health   Financial Resource Strain: Low Risk  (10/12/2023)   Overall Financial Resource Strain (CARDIA)    Difficulty of Paying Living Expenses: Not very hard  Food Insecurity: No Food Insecurity (10/12/2023)   Hunger Vital Sign    Worried  About Running Out of Food in the Last Year: Never true    Ran Out of Food in the Last Year: Never true  Transportation Needs: No Transportation Needs (10/12/2023)   PRAPARE - Administrator, Civil Service (Medical): No    Lack of Transportation (Non-Medical): No  Physical Activity: Insufficiently Active (10/12/2023)   Exercise Vital Sign    Days of Exercise per Week: 1 day    Minutes of Exercise per Session: 30 min  Stress: Stress Concern Present (10/12/2023)   [employer] of Occupational Health - Occupational Stress Questionnaire    Feeling of Stress : Rather much  Social Connections: Moderately Integrated (10/12/2023)   Social Connection and Isolation Panel [NHANES]    Frequency of Communication with Friends and Family: Three times a week    Frequency of Social Gatherings with Friends and Family: Once a week    Attends Religious Services: 1 to 4 times per year    Active Member of Clubs or Organizations: No    Attends Banker Meetings: 1 to 4 times per year    Marital Status: Divorced  Catering manager Violence: Not At Risk (09/19/2022)   Humiliation, Afraid, Rape, and Kick questionnaire    Fear of Current or Ex-Partner: No    Emotionally Abused: No    Physically Abused: No    Sexually Abused: No    Family History  Adopted: Yes  Problem Relation Age of Onset   Hypertension Mother    Hyperlipidemia Mother     Past Surgical History:  Procedure Laterality Date   ATRIAL FIBRILLATION ABLATION  06/30/2017   ATRIAL FIBRILLATION ABLATION N/A 06/30/2017    Procedure: Atrial Fibrillation Ablation;  Surgeon: Hillis Range, MD;  Location: [facility];  Service: Cardiovascular;  Laterality: N/A;   BICEPT TENODESIS Right 05/24/2020   Procedure: BICEPS TENODESIS;  Surgeon: Cammy Copa, MD;  Location: [facility];  Service: Orthopedics;  Laterality: Right;   BIOPSY THYROID  1996   CARDIOVERSION N/A 12/07/2013   Procedure: CARDIOVERSION;  Surgeon: Everette Rank, MD;  Location: [facility];  Service: Cardiovascular;  Laterality: N/A;   CARDIOVERSION N/A 03/10/2016   Procedure: CARDIOVERSION;  Surgeon: Vesta Mixer, MD;  Location: [facility] ENDOSCOPY;  Service: Cardiovascular;  Laterality: N/A;   CORONARY ATHERECTOMY N/A 09/18/2022   Procedure: CORONARY ATHERECTOMY;  Surgeon: Tonny Bollman, MD;  Location[facility] INVASIVE CV LAB;  Service: Cardiovascular;  Laterality: N/A;   CORONARY BALLOON ANGIOPLASTY N/A 06/24/2022   Procedure: CORONARY BALLOON ANGIOPLASTY;  Surgeon: Lyn Records, MD;  Location: [facility];  Service: Cardiovascular;  Laterality: N/A;   CORONARY STENT INTERVENTION N/A 09/18/2022   Procedure: CORONARY STENT INTERVENTION;  Surgeon: Tonny Bollman, MD;  Location: [facility] INVASIVE CV LAB;  Service: Cardiovascular;  Laterality: N/A;   ESOPHAGOGASTRODUODENOSCOPY N/A 12/07/2017   Procedure: ESOPHAGOGASTRODUODENOSCOPY (EGD);  Surgeon: Hilarie Fredrickson, MD;  Location: [facility] ENDOSCOPY;  Service: Endoscopy;  Laterality: N/A;   EYE MUSCLE SURGERY Right ~ 1966   LEFT HEART CATH AND CORONARY ANGIOGRAPHY N/A 06/24/2022   Procedure: LEFT HEART CATH AND CORONARY ANGIOGRAPHY;  Surgeon: Lyn Records, MD;  Location: [facility];  Service: Cardiovascular;  Laterality: N/A;   SHOULDER ARTHROSCOPY WITH ROTATOR CUFF REPAIR Right 05/24/2020   Procedure: right shoulder arthroscopy, rotator cuff tear repair biceps tenodesis;  Surgeon: Cammy Copa, MD;  Location: [facility];  Service: Orthopedics;  Laterality: Right;    TEE WITHOUT CARDIOVERSION N/A 06/29/2017  Procedure: TRANSESOPHAGEAL ECHOCARDIOGRAM (TEE);  Surgeon: Pricilla Riffle, MD;  Location: [facility] ENDOSCOPY;  Service: Cardiovascular;  Laterality: N/A;   TONSILLECTOMY  1971    ROS: Review of Systems Negative except as stated above  PHYSICAL EXAM: BP 116/72 (BP Location: Left Arm, Patient Position: Sitting, Cuff Size: Large)   Pulse (!) 59   Temp 97.7 F (36.5 C) (Oral)   Ht 5\' 5"  (1.651 m)   Wt 230 lb (104.3 kg)   LMP 04/15/2015 (LMP Unknown) Comment: last in may  SpO2 98%   BMI 38.27 kg/m   Wt Readings from Last 3 Encounters:  10/12/23 230 lb (104.3 kg)  07/13/23 237 lb 12.8 oz (107.9 kg)  06/11/23 239 lb 3.2 oz (108.5 kg)    Physical Exam  General appearance - alert, well appearing, and in no distress Mental status - normal mood, behavior, speech, dress, motor activity, and thought processes Neck - supple, no significant adenopathy Chest - clear to auscultation, no wheezes, rales or rhonchi, symmetric air entry Heart - normal rate, regular rhythm, normal S1, S2, no murmurs, rubs, clicks or gallops Extremities - peripheral pulses normal, no pedal edema, no clubbing or cyanosis     10/12/2023    3:10 PM 06/11/2023    2:10 PM 02/09/2023    2:05 PM  Depression screen PHQ 2/9  Decreased Interest 3 1 0  Down, Depressed, Hopeless 3 1 0  PHQ - 2 Score 6 2 0  Altered sleeping 2 1 0  Tired, decreased energy 2 1 0  Change in appetite 2 1 0  Feeling bad or failure about yourself  2 1 0  Trouble concentrating 2 1 0  Moving slowly or fidgety/restless 1 1 0  Suicidal thoughts 0 0 0  PHQ-9 Score 17 8 0  Difficult doing work/chores Somewhat difficult        10/12/2023    3:10 PM 06/11/2023    2:10 PM 02/09/2023    2:06 PM 01/09/2023    9:29 AM  GAD 7 : Generalized Anxiety Score  Nervous, Anxious, on Edge 2 1 0 1  Control/stop worrying 2 0 0 1  Worry too much - different things 2 0 0 1  Trouble relaxing 2 0 0 0  Restless 1 0 0 0   Easily annoyed or irritable 3 1 0 1  Afraid - awful might happen 1 0 0 0  Total GAD 7 Score 13 2 0 4  Anxiety Difficulty Somewhat difficult           Latest Ref Rng & Units 07/10/2023    8:42 AM 04/08/2023    8:16 AM 03/26/2023   11:17 AM  CMP  Glucose 70 - 99 mg/dL 409  811  914   BUN 8 - 27 mg/dL 12  10  12    Creatinine 0.57 - 1.00 mg/dL 7.82  9.56  2.13   Sodium 134 - 144 mmol/L 137  137  140   Potassium 3.5 - 5.2 mmol/L 4.4  4.7  4.3   Chloride 96 - 106 mmol/L 101  102  105   CO2 20 - 29 mmol/L 23  21  22    Calcium 8.7 - 10.3 mg/dL 9.2  8.9  9.2   Total Protein 6.0 - 8.5 g/dL 6.8   6.5   Total Bilirubin 0.0 - 1.2 mg/dL 0.4   0.5   Alkaline Phos 44 - 121 IU/L 75   78   AST 0 - 40 IU/L 12  14   ALT 0 - 32 IU/L 19   21    Lipid Panel     Component Value Date/Time   CHOL 125 07/10/2023 0842   TRIG 131 07/10/2023 0842   HDL 36 (L) 07/10/2023 0842   CHOLHDL 3.5 07/10/2023 0842   CHOLHDL 6.8 06/25/2022 0514   VLDL 61 (H) 06/25/2022 0514   LDLCALC 66 07/10/2023 0842   LDLDIRECT 114.0 02/13/2015 0750    CBC    Component Value Date/Time   WBC 6.5 10/09/2022 0829   WBC 6.0 09/19/2022 0159   RBC 4.00 10/09/2022 0829   RBC 3.78 (L) 09/19/2022 0159   HGB 12.8 10/09/2022 0829   HCT 38.9 10/09/2022 0829   PLT 265 10/09/2022 0829   MCV 97 10/09/2022 0829   MCH 32.0 10/09/2022 0829   MCH 31.7 09/19/2022 0159   MCHC 32.9 10/09/2022 0829   MCHC 33.1 09/19/2022 0159   RDW 12.5 10/09/2022 0829   LYMPHSABS 2.0 06/22/2022 1504   LYMPHSABS 1.7 11/25/2017 1017   MONOABS 0.7 06/22/2022 1504   EOSABS 0.3 06/22/2022 1504   EOSABS 0.3 11/25/2017 1017   BASOSABS 0.1 06/22/2022 1504   BASOSABS 0.1 11/25/2017 1017    ASSESSMENT AND PLAN: 1. Type 2 diabetes mellitus with morbid obesity (HCC) Encourage healthy eating habits. A1c increased from when she last saw her endocrinologist.  She reports morning blood sugar readings above 180.  I recommend increasing Semglee insulin to 84  units daily until she sees her endocrinologist.  Continue Mounjaro 10 mg once a week and metformin 1 g twice a day. - POCT glycosylated hemoglobin (Hb A1C) - POCT glucose (manual entry) - Microalbumin / creatinine urine ratio  2. Moderate major depression (HCC) We discussed increasing the Cymbalta from 30 mg daily to 60 mg daily.  She declines referral for counseling at this time but will let me know if she changes her mind.  Denies any suicidal ideation at this time. - DULoxetine (CYMBALTA) 60 MG capsule; Take 1 capsule (60 mg total) by mouth daily.  Dispense: 30 capsule; Refill: 4  3. Stressful life event affecting family See #2 above.  4. Hypertension associated with diabetes (HCC) At goal.  Continue metoprolol 75 mg daily, spironolactone 25 mg half a tablet daily, isosorbide 30 mg daily, lisinopril 10 mg daily  5. Coronary artery disease involving native coronary artery of native heart without angina pectoris Stable.  Continue atorvastatin 80 mg daily, Plavix 75 mg daily, metoprolol 75 mg daily - CBC  6. Atrial flutter, paroxysmal (HCC) On Xarelto and stable.  Due for CBC check.  7. Encounter for immunization - Flu vaccine trivalent PF, 6mos and older(Flulaval,Afluria,Fluarix,Fluzone)   Patient was given the opportunity to ask questions.  Patient verbalized understanding of the plan and was able to repeat key elements of the plan.   This documentation was completed using Paediatric nurse.  Any transcriptional errors are unintentional.  Orders Placed This Encounter  Procedures   Flu vaccine trivalent PF, 6mos and older(Flulaval,Afluria,Fluarix,Fluzone)   Microalbumin / creatinine urine ratio   CBC   POCT glycosylated hemoglobin (Hb A1C)   POCT glucose (manual entry)     Requested Prescriptions   Signed Prescriptions Disp Refills   DULoxetine (CYMBALTA) 60 MG capsule 30 capsule 4    Sig: Take 1 capsule (60 mg total) by mouth daily.    Return in  about 4 months (around 02/09/2024).  Jonah Blue, MD, FACP

## 2023-10-12 NOTE — Patient Instructions (Addendum)
Call [facility] to schedule your lung screening appointment.  Address: [address] [place], [place]  Phone number: [phone]

## 2023-10-13 LAB — CBC
Hematocrit: 41.8 % (ref 34.0–46.6)
Hemoglobin: 14 g/dL (ref 11.1–15.9)
MCH: 32.6 pg (ref 26.6–33.0)
MCHC: 33.5 g/dL (ref 31.5–35.7)
MCV: 97 fL (ref 79–97)
Platelets: 294 10*3/uL (ref 150–450)
RBC: 4.29 x10E6/uL (ref 3.77–5.28)
RDW: 12.1 % (ref 11.7–15.4)
WBC: 8.1 10*3/uL (ref 3.4–10.8)

## 2023-10-13 LAB — MICROALBUMIN / CREATININE URINE RATIO
Creatinine, Urine: 88.2 mg/dL
Microalb/Creat Ratio: 59 mg/g{creat} — ABNORMAL HIGH (ref 0–29)
Microalbumin, Urine: 51.9 ug/mL

## 2023-10-24 ENCOUNTER — Encounter: Payer: Self-pay | Admitting: Cardiovascular Disease

## 2023-10-24 NOTE — Progress Notes (Signed)
Pt cancelled  This encounter was created in error - please disregard. 

## 2023-10-26 ENCOUNTER — Ambulatory Visit: Payer: BC Managed Care – PPO | Attending: Cardiovascular Disease | Admitting: Cardiovascular Disease

## 2023-10-26 ENCOUNTER — Encounter: Payer: Self-pay | Admitting: Cardiovascular Disease

## 2023-10-26 VITALS — BP 122/78 | HR 76 | Ht 65.0 in | Wt 227.8 lb

## 2023-10-26 DIAGNOSIS — I2510 Atherosclerotic heart disease of native coronary artery without angina pectoris: Secondary | ICD-10-CM | POA: Diagnosis not present

## 2023-10-26 DIAGNOSIS — E782 Mixed hyperlipidemia: Secondary | ICD-10-CM | POA: Diagnosis not present

## 2023-10-26 DIAGNOSIS — R0609 Other forms of dyspnea: Secondary | ICD-10-CM | POA: Diagnosis not present

## 2023-10-26 DIAGNOSIS — Z79899 Other long term (current) drug therapy: Secondary | ICD-10-CM | POA: Diagnosis not present

## 2023-10-26 MED ORDER — SPIRONOLACTONE 25 MG PO TABS: 25.0000 mg | ORAL_TABLET | Freq: Every day | ORAL | 3 refills | Status: DC

## 2023-10-26 NOTE — Progress Notes (Signed)
Cardiology Office Note   Date:  10/26/2023   ID:  Kelly Lang, DOB 1961-08-18, MRN [id number]  PCP:  Marcine Matar, MD  Cardiologist:   Kristeen Miss, MD   Chief Complaint  Patient presents with   Atrial Fibrillation   Coronary Artery Disease        1. Paroxysmal Atrial fibrillation 2. Hypothyroidism 3. Type 2 diabetes mellitus 4. Chronic systolic CHF 5.  CAD  6. Obesity     Kelly Lang is a 62 y.o. female with a hx of T2DM, HL, thyroid disease and atrial fibrillation.  She had been on Synthroid for some time after a thyroid bx in 1996.  She was admitted 10/2013 with AFib with RVR.  Echo (10/26/13):  EF 40-45%, normal wall motion, mild LAE.  CHADS2-VASc= 3 (LV dysfunction, DM, female).  She was placed on coumadin.  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  She was last seen 11/29/2013.  She was set up for DCCV after 3-4 weeks of appropriate anticoagulation.  DCCV was performed 12/07/2013 but was unsuccessful.  She returns for follow up.  She is stable. She is frustrated with her atrial fibrillation. She does get tired more easily. She denies chest pain. She denies significant dyspnea. She is NYHA class II. She denies syncope. She denies orthopnea, PND, edema, palpitations.  Feb. 16, 2015:  She had a cardioversion twice.  Converted back to Afib both times. She has had a stress myoview which was negative for ischemia.    we will start flecainide today.  February 15, 2014:  Kelly Lang returns for further evaluation of her atrial fib.  She has been on Flecainide 100 BID for a month.  She started feeling better about 2 weeks ago.  She has converted to NSR.   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    Sept. 16, 2015:  Kelly Lang is doing well.  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     February 13, 2015:  Kelly Lang is a 62 y.o. female who presents for follow-up of atrial fibrillation. Is on  coumadin . INR is 2.0 this am.   No CP or dyspnea.   Glucose levels are ok.   HbA1C is a bit high . Was started on Tragenta. Only a little bit of exercise.   Missed 4 days of flecainide due to insurance not paying for it. Staying in NSR  Nov. 29, 2016:  Had a CVA in Oct, 2016.  Had not been on her coumadin and all of her medications.  Echo revealed severe LV dysfunction with EF of 20-25%.   Was started on Xarelto and metoprolol at that time   Has had some some chills and achy feeling for the past couple of days. No cough, no fever, no runny nose.  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     February 08, 2016:  Kelly Lang has been back on her meds.   She had stopped her medications and had developed worsening congestive heart failure. Previous echocardiogram revealed an EF of 25%. She restart her medications and the recent echo 2 days ago reveals recovery of her left ventricular systolic function to an ejection fraction 45% which is her normal.  She's feeling quite a bit better. She has taken  flecainide in the past which helped her stay in normal sinus rhythm. She stopped the flecainide due to lack of insurance. She now has [employer] and wants to get back on flecainide.  March 07, 2016:  Kelly Lang is seen back today for follow-up of her atrial fibrillation. She's been on flecainide 100 mg twice a day.  She's had her stress test for flecainide testing and it revealed no QRS widening. She had an unsuccessful cardioversion Jan. 7, 2015 She cannot tell how much she is in NSR vs. Afib.   Oct. 9, 2017:  She is s/p cardioversion in April She is feeling better.  Able to walk without any dyspnea   May 18, 2017:  Kelly Lang is seen back today for follow up  No CP or dyspnea  Is now on Jardiance for the past week   Apr 12, 2018  Kelly Lang is seen for follow up  visit  Has had an ablation since I last  Feels so much better.    Able to walk but not getting any regular exercise  Still on Xarelto.  Body mass index is 39.88 kg/m.  October 05, 2019: Kelly Lang is seen today for follow-up visit.  He has a history of paroxysmal atrial fibrillation, obesity, hyperlipidemia and diabetes mellitus. Is not exercising much .  Working on getting diabetes regulated   Wt is 250 ( up 6 lbs)  Is s/p AFIB ablation    February 26, 2021:  Kelly Lang is seen today - hx of atrial fib, - s/p ablation Hx of HLD, DM, obesity Wt is 235 lbs   Chol levels are ok  Chol = 115 HDL = 32 LDL = 49 Trigs are 208  !!  Is trying to lose some weight   Mar 31, 2022: Kelly Lang is seen for follow  up Is maintaining  NSR   She still on Xarelto.  Weight today is 231 pounds.  She is down 19 pounds from 3 years ago.  Oct. 10, 2023 Kelly Lang is seen for follow up of her atrial fib, obesity, Was hospitalized in July with dyspnea and intrascapular pain  Early in the am Lasted for several hours  Went to work  Recurrent several days later,  went to [employer] [facility], then ultimately to [facility]    Aorta CTA : showed no dissection  Tropononis 106, 80  Heart catheterization on June 24, 2022 revealed 100% occlusion of the proximal to mid LAD.  This was a very heavily calcified segment.  She has right to left collaterals that fill the LAD in a retrograde fashion.  There is thrombus noted just distal to the high-grade obstruction.  The left circumflex and the right coronary artery are normal.  LV function was mildly reduced with an EF of 45 to 50%.  The proximal LAD lesion was crossed with a wire.  There was no balloon that was able to cross.  The plan was to anticoagulate for 4 to 6 weeks and then consider repeat procedure by the CTO team.  There was also consideration to perform a orbital atherectomy prior to stenting.   Still has the intrascapular pain with exertion , which is  associated with dyspnea   Jan. 19, 2024 Kelly Lang had PCI of her prox LAD , complicated by distal embolization  Hx of PAF Is exercising only a little bit Is working quite a [employer] is 234 Still struggling with her weight. She does not have any acute problems.  Will have  her see Marcelino Duster, NP in 3 months for further counseling on diet, exercise, weight loss  Nov. 25 , 2024  Kelly Lang is here for follow-up of her coronary artery disease and atrial fibrillation.  Weight today is 227 pounds which is down 7 pounds from her last visit in January.  Has had some dyspnea with exertion  Had dyspnea moving a banquet chair 5 feet.   Echo from July, 2023 showed LVEF of 45-50%. She is avoiding salt   No CP      Past Medical History:  Diagnosis Date   Arthritis    "all over" (06/30/2017)   Chicken pox    Dyslipidemia    Dysrhythmia ablation 2018   a-fib   GERD (gastroesophageal reflux disease)    H/O: hypothyroidism    a. thyroid biopsy 1996 with synthroid. Stopped taking rx and was loss to follow up b. normal thyroid function 10/20/13   High cholesterol    Hx of cardiovascular stress test    ETT/Lexiscan Myoview (12/2013):  No ischemia; not gated; low risk.   Hypertension    Obesity    Persistent atrial fibrillation (HCC)    a diagnosed 10/25/2013   Psoriasis    on Skyrizi   Seasonal allergies    Stroke [facility]) 2016   "some speech problems since" (06/30/2017)   Tachycardia induced cardiomyopathy (HCC)    a. 2/2 Afib with RVR for unknown duration (EF: 40-45%)   Thyroid disease    Type II diabetes mellitus (HCC)    a. hg A1c 10, newly diagnosed (10/26/13)    Past Surgical History:  Procedure Laterality Date   ATRIAL FIBRILLATION ABLATION  06/30/2017   ATRIAL FIBRILLATION ABLATION N/A 06/30/2017   Procedure: Atrial Fibrillation Ablation;  Surgeon: Hillis Range, MD;  Location: [facility];  Service: Cardiovascular;  Laterality: N/A;   BICEPT TENODESIS Right 05/24/2020    Procedure: BICEPS TENODESIS;  Surgeon: Cammy Copa, MD;  Location: [facility];  Service: Orthopedics;  Laterality: Right;   BIOPSY THYROID  1996   CARDIOVERSION N/A 12/07/2013   Procedure: CARDIOVERSION;  Surgeon: Everette Rank, MD;  Location: [facility];  Service: Cardiovascular;  Laterality: N/A;   CARDIOVERSION N/A 03/10/2016   Procedure: CARDIOVERSION;  Surgeon: Vesta Mixer, MD;  Location: [facility] END[facility]COPY;  Service: Cardiovascular;  Laterality: N/A;   CORONARY ATHERECTOMY N/A 09/18/2022   Procedure: CORONARY ATHERECTOMY;  Surgeon: Tonny Bollman, MD;  Location: [facility] INVASIVE CV LAB;  Service: Cardiovascular;  Laterality: N/A;   CORONARY BALLOON ANGIOPLASTY N/A 06/24/2022   Procedure: CORONARY BALLOON ANGIOPLASTY;  Surgeon: Lyn Records, MD;  Location: [facility];  Service: Cardiovascular;  Laterality: N/A;   CORONARY STENT INTERVENTION N/A 09/18/2022   Procedure: CORONARY STENT INTERVENTION;  Surgeon: Tonny Bollman, MD;  Location: [facility] INVASIVE CV LAB;  Service: Cardiovascular;  Laterality: N/A;   ESOPHAGOGASTRODUODENOSCOPY N/A 12/07/2017   Procedure: ESOPHAGOGASTRODUODENOSCOPY (EGD);  Surgeon: Hilarie Fredrickson, MD;  Location: [facility]COPY;  Service: Endoscopy;  Laterality: N/A;   EYE MUSCLE SURGERY Right ~ 1966   LEFT HEART CATH AND CORONARY ANGIOGRAPHY N/A 06/24/2022   Procedure: LEFT HEART CATH AND CORONARY ANGIOGRAPHY;  Surgeon: Lyn Records, MD;  Location: [facility];  Service: Cardiovascular;  Laterality: N/A;   SHOULDER ARTHROSCOPY WITH ROTATOR CUFF REPAIR Right 05/24/2020   Procedure: right shoulder arthroscopy, rotator cuff tear repair biceps tenodesis;  Surgeon: Cammy Copa, MD;  Location: [facility];  Service: Orthopedics;  Laterality: Right;  TEE WITHOUT CARDIOVERSION N/A 06/29/2017   Procedure: TRANSESOPHAGEAL ECHOCARDIOGRAM (TEE);  Surgeon: Pricilla Riffle, MD;  Location: [facility] ENDOSCOPY;  Service: Cardiovascular;  Laterality:  N/A;   TONSILLECTOMY  1971     Current Outpatient Medications  Medication Sig Dispense Refill   acetaminophen (TYLENOL) 500 MG tablet Take 1,000 mg by mouth every 6 (six) hours as needed for headache.     atorvastatin (LIPITOR) 80 MG tablet Take 1 tablet by mouth once daily 90 tablet 3   clopidogrel (PLAVIX) 75 MG tablet Take 1 tablet by mouth once daily with breakfast 90 tablet 2   Continuous Blood Gluc Receiver (DEXCOM G7 RECEIVER) DEVI Use to monitor BG continuously 1 each 0   Continuous Glucose Sensor (DEXCOM G7 SENSOR) MISC USE AS DIRECTED CHANGE  EVERY  10  DAYS. 3 each 2   diclofenac Sodium (VOLTAREN) 1 % GEL Apply 2 g topically 4 (four) times daily. 100 g 2   DULoxetine (CYMBALTA) 60 MG capsule Take 1 capsule (60 mg total) by mouth daily. 30 capsule 4   isosorbide mononitrate (IMDUR) 30 MG 24 hr tablet Take 1 tablet by mouth once daily 90 tablet 1   lisinopril (ZESTRIL) 10 MG tablet Take 1 tablet by mouth once daily 90 tablet 3   loratadine (CLARITIN) 10 MG tablet Take 10 mg by mouth daily.     metFORMIN (GLUCOPHAGE) 1000 MG tablet TAKE 1 TABLET BY MOUTH TWICE DAILY WITH A MEAL 180 tablet 1   metoprolol succinate (TOPROL-XL) 25 MG 24 hr tablet Take 3 tablets by mouth once daily 270 tablet 1   ONETOUCH DELICA LANCETS 33G MISC Use as instructed to check blood sugar up to 3 times daily. 100 each 11   ONETOUCH VERIO test strip USE STRIPS TO CHECK GLUCOSE UP TO THREE TIMES DAILY AS DIRECTED 100 each 6   pantoprazole (PROTONIX) 40 MG tablet Take 1 tablet (40 mg total) by mouth daily. 30 tablet 3   rivaroxaban (XARELTO) 20 MG TABS tablet TAKE 1 TABLET BY MOUTH ONCE DAILY WITH SUPPER 90 tablet 1   SEMGLEE, YFGN, 100 UNIT/ML Pen INJECT 80 UNITS SUBCUTANEOUSLY AT BEDTIME 30 mL 0   SKYRIZI PEN 150 MG/ML SOAJ Inject 150 mg into the skin every 3 (three) months.     spironolactone (ALDACTONE) 25 MG tablet Take 1 tablet (25 mg total) by mouth daily. 90 tablet 3   tirzepatide (MOUNJARO) 10 MG/0.5ML  Pen Inject 10 mg into the skin once a week. 6 mL 1   TRUEPLUS PEN NEEDLES 32G X 4 MM MISC USE AS DIRECTED AT BEDTIME. 100 each 2   No current facility-administered medications for this visit.    Allergies:   Jardiance [empagliflozin] and Codeine    Social History:  The patient  reports that she quit smoking about 12 years ago. Her smoking use included cigarettes. She started smoking about 34 years ago. She has a 22 pack-year smoking history. She has never used smokeless tobacco. She reports current alcohol use. She reports that she does not use drugs.   Family History:  The patient's family history includes Hyperlipidemia in her mother; Hypertension in her mother. She was adopted.    Physical Exam: Blood pressure 122/78, pulse 76, height 5\' 5"  (1.651 m), weight 227 lb 12.8 oz (103.3 kg), last menstrual period 04/15/2015, SpO2 96%.       GEN:  obese middle age female  in no acute distress HEENT: Normal NECK: No JVD; No carotid bruits LYMPHATICS: No lymphadenopathy CARDIAC:  RRR , no murmurs, rubs, gallops RESPIRATORY:  Clear to auscultation without rales, wheezing or rhonchi  ABDOMEN: Soft, non-tender, non-distended MUSCULOSKELETAL:  No edema; No deformity  SKIN: Warm and dry NEUROLOGIC:  Alert and oriented x 3   EKG:    EKG Interpretation Date/Time:  Monday October 26 2023 10:00:09 EST Ventricular Rate:  78 PR Interval:  214 QRS Duration:  98 QT Interval:  396 QTC Calculation: 451 R Axis:   -51  Text Interpretation: Sinus rhythm with sinus arrhythmia with 1st degree A-V block with occasional Premature ventricular complexes Left anterior fascicular block Minimal voltage criteria for LVH, may be normal variant ( Cornell product ) Septal infarct (cited on or before 25-Jun-2022) When compared with ECG of 19-Sep-2022 04:39, Premature ventricular complexes are now Present Nonspecific T wave abnormality no longer evident in Inferior leads Confirmed by Kristeen Miss ([id number]) on  10/26/2023 10:08:10 AM       Recent Labs: 07/10/2023: ALT 19; BUN 12; Creatinine, Ser 0.71; Potassium 4.4; Sodium 137 10/12/2023: Hemoglobin 14.0; Platelets 294    Lipid Panel    Component Value Date/Time   CHOL 125 07/10/2023 0842   TRIG 131 07/10/2023 0842   HDL 36 (L) 07/10/2023 0842   CHOLHDL 3.5 07/10/2023 0842   CHOLHDL 6.8 06/25/2022 0514   VLDL 61 (H) 06/25/2022 0514   LDLCALC 66 07/10/2023 0842   LDLDIRECT 114.0 02/13/2015 0750      Wt Readings from Last 3 Encounters:  10/26/23 227 lb 12.8 oz (103.3 kg)  10/12/23 230 lb (104.3 kg)  07/13/23 237 lb 12.8 oz (107.9 kg)      Other studies Reviewed: Additional studies/ records that were reviewed today include: . Review of the above records demonstrates:    ASSESSMENT AND PLAN:  1. Atrial fibrillation- remains in NSR  Cont xarelto      2. CAD :   no angina .  Cont meds    3. HFrEF:   EF is 45%.   Will increase spironolactone to 25 mg a day ,  BMP in 2-3 weeks. Will consider changing lisinopril to Entesto at her next visit    4.   Hyperlipidemia:   5.  Moderate obesity:    Body mass index is 37.91 kg/m.  ing improving her diet and working in some exercise/ improving lifestyle  Is on tirzepatide     Current medicines are reviewed at length with the patient today.  The patient does not have concerns regarding medicines.  The following changes have been made:  no change  Labs/ tests ordered today include:   Orders Placed This Encounter  Procedures   Basic metabolic panel   EKG 12-Lead   ECHOCARDIOGRAM COMPLETE    Disposition:    3 months with Eligha Bridegroom, NP Will see me in a year - sooner if needed.     Kristeen Miss, MD  10/26/2023 10:44 AM    [facility] [address] [place], [place], [place]  [address] Phone: [phone]; Fax: [phone]

## 2023-10-26 NOTE — Patient Instructions (Signed)
Medication Instructions:  INCREASE Spironolactone to 25mg  daily *If you need a refill on your cardiac medications before your next appointment, please call your pharmacy*  Lab Work: BMET in 2-3 weeks If you have labs (blood work) drawn today and your tests are completely normal, you will receive your results only by: MyChart Message (if you have MyChart) OR A paper copy in the mail If you have any lab test that is abnormal or we need to change your treatment, we will call you to review the results.  Testing/Procedures: ECHO Your physician has requested that you have an echocardiogram. Echocardiography is a painless test that uses sound waves to create images of your heart. It provides your doctor with information about the size and shape of your heart and how well your heart's chambers and valves are working. This procedure takes approximately one hour. There are no restrictions for this procedure. Please do NOT wear cologne, perfume, aftershave, or lotions (deodorant is allowed). Please arrive 15 minutes prior to your appointment time.  Please note: We ask at that you not bring children with you during ultrasound (echo/ vascular) testing. Due to room size and safety concerns, children are not allowed in the ultrasound rooms during exams. Our front office staff cannot provide observation of children in our lobby area while testing is being conducted. An adult accompanying a patient to their appointment will only be allowed in the ultrasound room at the discretion of the ultrasound technician under special circumstances. We apologize for any inconvenience.  Follow-Up: At [facility], you and your health needs are our priority.  As part of our continuing mission to provide you with exceptional heart care, we have created designated Provider Care Teams.  These Care Teams include your primary Cardiologist (physician) and Advanced Practice Providers (APPs -  Physician Assistants and Nurse  Practitioners) who all work together to provide you with the care you need, when you need it.  Your next appointment:   3 month(s)  Provider:   Kristeen Miss, MD

## 2023-10-27 ENCOUNTER — Ambulatory Visit: Payer: BC Managed Care – PPO | Admitting: Cardiovascular Disease

## 2023-11-06 LAB — BASIC METABOLIC PANEL
BUN/Creatinine Ratio: 17 (ref 12–28)
BUN: 12 mg/dL (ref 8–27)
CO2: 23 mmol/L (ref 20–29)
Calcium: 9.1 mg/dL (ref 8.7–10.3)
Chloride: 102 mmol/L (ref 96–106)
Creatinine, Ser: 0.69 mg/dL (ref 0.57–1.00)
Glucose: 128 mg/dL — ABNORMAL HIGH (ref 70–99)
Potassium: 4.6 mmol/L (ref 3.5–5.2)
Sodium: 137 mmol/L (ref 134–144)
eGFR: 99 mL/min/{1.73_m2} (ref >59)

## 2023-11-12 ENCOUNTER — Encounter: Payer: Self-pay | Admitting: "Endocrinology

## 2023-11-12 ENCOUNTER — Ambulatory Visit: Payer: BC Managed Care – PPO | Admitting: "Endocrinology

## 2023-11-12 VITALS — BP 138/66 | HR 64 | Ht 65.0 in | Wt 236.0 lb

## 2023-11-12 DIAGNOSIS — E1159 Type 2 diabetes mellitus with other circulatory complications: Secondary | ICD-10-CM

## 2023-11-12 DIAGNOSIS — I10 Essential (primary) hypertension: Secondary | ICD-10-CM | POA: Diagnosis not present

## 2023-11-12 DIAGNOSIS — E66812 Obesity, class 2: Secondary | ICD-10-CM

## 2023-11-12 DIAGNOSIS — Z794 Long term (current) use of insulin: Secondary | ICD-10-CM | POA: Diagnosis not present

## 2023-11-12 DIAGNOSIS — E782 Mixed hyperlipidemia: Secondary | ICD-10-CM

## 2023-11-12 DIAGNOSIS — Z6837 Body mass index (BMI) 37.0-37.9, adult: Secondary | ICD-10-CM

## 2023-11-12 MED ORDER — TIRZEPATIDE 12.5 MG/0.5ML ~~LOC~~ SOAJ: 12.5000 mg | SUBCUTANEOUS | 1 refills | Status: DC

## 2023-11-12 NOTE — Patient Instructions (Signed)

## 2023-11-12 NOTE — Progress Notes (Signed)
11/12/2023, 5:16 PM  Endocrinology follow-up note   Subjective:    Patient ID: Kelly Lang, female    DOB: 1960-12-26.  Kelly Lang is being seen in follow-up after she was seen in consultation for management of currently uncontrolled symptomatic diabetes requested by  Marcine Matar, MD.   Past Medical History:  Diagnosis Date   Arthritis    "all over" (06/30/2017)   Chicken pox    Dyslipidemia    Dysrhythmia ablation 2018   a-fib   GERD (gastroesophageal reflux disease)    H/O: hypothyroidism    a. thyroid biopsy 1996 with synthroid. Stopped taking rx and was loss to follow up b. normal thyroid function 10/20/13   High cholesterol    Hx of cardiovascular stress test    ETT/Lexiscan Myoview (12/2013):  No ischemia; not gated; low risk.   Hypertension    Obesity    Persistent atrial fibrillation (HCC)    a diagnosed 10/25/2013   Psoriasis    on Skyrizi   Seasonal allergies    Stroke [facility]) 2016   "some speech problems since" (06/30/2017)   Tachycardia induced cardiomyopathy (HCC)    a. 2/2 Afib with RVR for unknown duration (EF: 40-45%)   Thyroid disease    Type II diabetes mellitus (HCC)    a. hg A1c 10, newly diagnosed (10/26/13)    Past Surgical History:  Procedure Laterality Date   ATRIAL FIBRILLATION ABLATION  06/30/2017   ATRIAL FIBRILLATION ABLATION N/A 06/30/2017   Procedure: Atrial Fibrillation Ablation;  Surgeon: Hillis Range, MD;  Location: [facility];  Service: Cardiovascular;  Laterality: N/A;   BICEPT TENODESIS Right 05/24/2020   Procedure: BICEPS TENODESIS;  Surgeon: Cammy Copa, MD;  Location: [facility];  Service: Orthopedics;  Laterality: Right;   BIOPSY THYROID  1996   CARDIOVERSION N/A 12/07/2013   Procedure: CARDIOVERSION;  Surgeon: Everette Rank, MD;  Location: [facility] ENDOSCOPY;  Service: Cardiovascular;  Laterality: N/A;    CARDIOVERSION N/A 03/10/2016   Procedure: CARDIOVERSION;  Surgeon: Vesta Mixer, MD;  Location: [facility] ENDOSCOPY;  Service: Cardiovascular;  Laterality: N/A;   CORONARY ATHERECTOMY N/A 09/18/2022   Procedure: CORONARY ATHERECTOMY;  Surgeon: Tonny Bollman, MD;  Location[facility]VAS[facility] CV LAB;  Service: Cardiovascular;  Laterality: N/A;   CORONARY BALLOON ANGIOPLASTY N/A 06/24/2022   Procedure: CORONARY BALLOON ANGIOPLASTY;  Surgeon: Lyn Records, MD;  Location: [facility];  Service: Cardiovascular;  Laterality: N/A;   CORONARY STENT INTERVENTION N/A 09/18/2022   Procedure: CORONARY STENT INTERVENTION;  Surgeon: Tonny Bollman, MD;  Location: [facility] INVASIVE CV LAB;  Service: Cardiovascular;  Laterality: N/A;   ESOPHAGOGASTRODUODENOSCOPY N/A 12/07/2017   Procedure: ESOPHAGOGASTRODUODENOSCOPY (EGD);  Surgeon: Hilarie Fredrickson, MD;  Location: [facility] ENDOSCOPY;  Service: Endoscopy;  Laterality: N/A;   EYE MUSCLE SURGERY Right ~ 1966   LEFT HEART CATH AND CORONARY ANGIOGRAPHY N/A 06/24/2022   Procedure: LEFT HEART CATH AND CORONARY ANGIOGRAPHY;  Surgeon: Lyn Records, MD;  Location: [facility];  Service: Cardiovascular;  Laterality: N/A;   SHOULDER ARTHROSCOPY WITH ROTATOR CUFF REPAIR Right 05/24/2020   Procedure: right  shoulder arthroscopy, rotator cuff tear repair biceps tenodesis;  Surgeon: Cammy Copa, MD;  Location: [facility];  Service: Orthopedics;  Laterality: Right;   TEE WITHOUT CARDIOVERSION N/A 06/29/2017   Procedure: TRANSESOPHAGEAL ECHOCARDIOGRAM (TEE);  Surgeon: Pricilla Riffle, MD;  Location: [facility] ENDOSCOPY;  Service: Cardiovascular;  Laterality: N/A;   TONSILLECTOMY  1971    Social History   Socioeconomic History   Marital status: Divorced    Spouse name: Not on file   Number of children: 0   Years of education: 14   Highest education level: Some college, no degree  Occupational History   Occupation: Designer, television/film set  Tobacco Use   Smoking status: Former     Current packs/day: 0.00    Average packs/day: 1 pack/day for 22.0 years (22.0 ttl pk-yrs)    Types: Cigarettes    Start date: 12/01/1988    Quit date: 12/01/2010    Years since quitting: 12.9   Smokeless tobacco: Never  Vaping Use   Vaping status: Never Used  Substance and Sexual Activity   Alcohol use: Yes    Comment: social   Drug use: No   Sexual activity: Not Currently  Other Topics Concern   Not on file  Social History Narrative   Moved to [place] from [place] in 2002.     Fun: shop, sew, decorate   Denies any beliefs effecting healthcare   Social Drivers of Health   Financial Resource Strain: Low Risk  (10/12/2023)   Overall Financial Resource Strain (CARDIA)    Difficulty of Paying Living Expenses: Not very hard  Food Insecurity: No Food Insecurity (10/12/2023)   Hunger Vital Sign    Worried About Running Out of Food in the Last Year: Never true    Ran Out of Food in the Last Year: Never true  Transportation Needs: No Transportation Needs (10/12/2023)   PRAPARE - Administrator, Civil Service (Medical): No    Lack of Transportation (Non-Medical): No  Physical Activity: Insufficiently Active (10/12/2023)   Exercise Vital Sign    Days of Exercise per Week: 1 day    Minutes of Exercise per Session: 30 min  Stress: Stress Concern Present (10/12/2023)   [employer] of Occupational Health - Occupational Stress Questionnaire    Feeling of Stress : Rather much  Social Connections: Moderately Integrated (10/12/2023)   Social Connection and Isolation Panel [NHANES]    Frequency of Communication with Friends and Family: Three times a week    Frequency of Social Gatherings with Friends and Family: Once a week    Attends Religious Services: 1 to 4 times per year    Active Member of [employer] or Organizations: No    Attends Engineer, structural: 1 to 4 times per year    Marital Status: Divorced    Family History  Adopted: Yes  Problem Relation  Age of Onset   Hypertension Mother    Hyperlipidemia Mother     Outpatient Encounter Medications as of 11/12/2023  Medication Sig   tirzepatide (MOUNJARO) 12.5 MG/0.5ML Pen Inject 12.5 mg into the skin once a week.   acetaminophen (TYLENOL) 500 MG tablet Take 1,000 mg by mouth every 6 (six) hours as needed for headache.   atorvastatin (LIPITOR) 80 MG tablet Take 1 tablet by mouth once daily   clopidogrel (PLAVIX) 75 MG tablet Take 1 tablet by mouth once daily with breakfast   Continuous Blood Gluc Receiver (DEXCOM G7 RECEIVER) DEVI Use to monitor BG  continuously   Continuous Glucose Sensor (DEXCOM G7 SENSOR) MISC USE AS DIRECTED CHANGE  EVERY  10  DAYS.   diclofenac Sodium (VOLTAREN) 1 % GEL Apply 2 g topically 4 (four) times daily.   DULoxetine (CYMBALTA) 60 MG capsule Take 1 capsule (60 mg total) by mouth daily.   isosorbide mononitrate (IMDUR) 30 MG 24 hr tablet Take 1 tablet by mouth once daily   lisinopril (ZESTRIL) 10 MG tablet Take 1 tablet by mouth once daily   loratadine (CLARITIN) 10 MG tablet Take 10 mg by mouth daily.   metFORMIN (GLUCOPHAGE) 1000 MG tablet TAKE 1 TABLET BY MOUTH TWICE DAILY WITH A MEAL   metoprolol succinate (TOPROL-XL) 25 MG 24 hr tablet Take 3 tablets by mouth once daily   ONETOUCH DELICA LANCETS 33G MISC Use as instructed to check blood sugar up to 3 times daily.   ONETOUCH VERIO test strip USE STRIPS TO CHECK GLUCOSE UP TO THREE TIMES DAILY AS DIRECTED   pantoprazole (PROTONIX) 40 MG tablet Take 1 tablet (40 mg total) by mouth daily.   rivaroxaban (XARELTO) 20 MG TABS tablet TAKE 1 TABLET BY MOUTH ONCE DAILY WITH SUPPER   SEMGLEE, YFGN, 100 UNIT/ML Pen INJECT 80 UNITS SUBCUTANEOUSLY AT BEDTIME   SKYRIZI PEN 150 MG/ML SOAJ Inject 150 mg into the skin every 3 (three) months.   spironolactone (ALDACTONE) 25 MG tablet Take 1 tablet (25 mg total) by mouth daily.   TRUEPLUS PEN NEEDLES 32G X 4 MM MISC USE AS DIRECTED AT BEDTIME.   [DISCONTINUED] tirzepatide  (MOUNJARO) 10 MG/0.5ML Pen Inject 10 mg into the skin once a week.   No facility-administered encounter medications on file as of 11/12/2023.    ALLERGIES: Allergies  Allergen Reactions   Jardiance [Empagliflozin] Other (See Comments)    Yeast infections   Codeine Itching and Rash    VACCINATION STATUS: Immunization History  Administered Date(s) Administered   Influenza, Seasonal, Injecte, Preservative Fre 10/12/2023   Influenza,inj,Quad PF,6+ Mos 11/19/2016, 09/07/2017, 08/09/2018, 08/16/2020, 08/23/2021, 09/08/2022   Moderna Covid-19 Vaccine Bivalent Booster 8yrs & up 09/10/2022   Moderna Sars-Covid-2 Vaccination 01/04/2020, 02/01/2020, 10/12/2020, 06/09/2021   Pneumococcal Polysaccharide-23 06/16/2016   Tdap 06/16/2016   Zoster Recombinant(Shingrix) 07/14/2022, 10/13/2022    Diabetes She presents for her follow-up diabetic visit. She has type 2 diabetes mellitus. Onset time: She was diagnosed at approximate age of 50 years. Her disease course has been worsening. There are no hypoglycemic associated symptoms. Pertinent negatives for hypoglycemia include no confusion, headaches, pallor or seizures. Pertinent negatives for diabetes include no chest pain, no fatigue, no polydipsia, no polyphagia and no polyuria. There are no hypoglycemic complications. Symptoms are worsening. Diabetic complications include a CVA and heart disease. Risk factors for coronary artery disease include dyslipidemia, diabetes mellitus, obesity, hypertension, post-menopausal, sedentary lifestyle and tobacco exposure. Current diabetic treatment includes insulin injections (She is currently on Semglee 80 units nightly, Ozempic 2 mg weekly.  Glipizide 5 mg daily.  Metformin 1000 mg p.o. twice daily.). Her weight is increasing steadily. She is following a generally unhealthy diet. When asked about meal planning, she reported none. She has had a previous visit with a dietitian. She participates in exercise  intermittently. Her home blood glucose trend is decreasing steadily. Her breakfast blood glucose range is generally 180-200 mg/dl. Her lunch blood glucose range is generally 180-200 mg/dl. Her dinner blood glucose range is generally 180-200 mg/dl. Her bedtime blood glucose range is generally 180-200 mg/dl. Her overall blood glucose range is 180-200 mg/dl. (  Lillyanah presents with her CGM device showing average blood glucose of 198 mg per DL.  Her overview shows 38% time in range, 49% level 1 hyperglycemia, 13% level 2 hyperglycemia.  She did not document any hypoglycemia.  Her recent previsit labs show A1c of 8.3% increasing from 7.6%.     ) An ACE inhibitor/angiotensin II receptor blocker is being taken. Eye exam is current.  Hypertension This is a chronic problem. The current episode started more than 1 year ago. The problem is controlled. Pertinent negatives include no chest pain, headaches, palpitations or shortness of breath. Risk factors for coronary artery disease include diabetes mellitus, dyslipidemia, obesity, sedentary lifestyle, smoking/tobacco exposure and post-menopausal state. Past treatments include ACE inhibitors and beta blockers. Hypertensive end-organ damage includes CVA.  Hyperlipidemia This is a chronic problem. The current episode started more than 1 year ago. The problem is uncontrolled. Exacerbating diseases include diabetes and obesity. Pertinent negatives include no chest pain, myalgias or shortness of breath. Risk factors for coronary artery disease include dyslipidemia, diabetes mellitus, hypertension, obesity, a sedentary lifestyle and post-menopausal.    Review of Systems  Constitutional:  Negative for chills, fatigue, fever and unexpected weight change.  HENT:  Negative for trouble swallowing and voice change.   Eyes:  Negative for visual disturbance.  Respiratory:  Negative for cough, shortness of breath and wheezing.   Cardiovascular:  Negative for chest pain,  palpitations and leg swelling.  Gastrointestinal:  Negative for diarrhea, nausea and vomiting.  Endocrine: Negative for cold intolerance, heat intolerance, polydipsia, polyphagia and polyuria.  Musculoskeletal:  Negative for arthralgias and myalgias.  Skin:  Negative for color change, pallor, rash and wound.  Neurological:  Negative for seizures and headaches.  Psychiatric/Behavioral:  Negative for confusion and suicidal ideas.     Objective:       11/12/2023    3:02 PM 10/26/2023    9:51 AM 10/12/2023    3:04 PM  Vitals with BMI  Height 5\' 5"  5\' 5"  5\' 5"   Weight 236 lbs 227 lbs 13 oz 230 lbs  BMI 39.27 37.91 38.27  Systolic 138 122 347  Diastolic 66 78 72  Pulse 64 76 59    BP 138/66   Pulse 64   Ht 5\' 5"  (1.651 m)   Wt 236 lb (107 kg)   LMP 04/15/2015 (LMP Unknown) Comment: last in may  BMI 39.27 kg/m   Wt Readings from Last 3 Encounters:  11/12/23 236 lb (107 kg)  10/26/23 227 lb 12.8 oz (103.3 kg)  10/12/23 230 lb (104.3 kg)     CMP     Component Value Date/Time   NA 137 11/06/2023 0838   K 4.6 11/06/2023 0838   CL 102 11/06/2023 0838   CO2 23 11/06/2023 0838   GLUCOSE 128 (H) 11/06/2023 0838   GLUCOSE 111 (H) 09/19/2022 0159   BUN 12 11/06/2023 0838   CREATININE 0.69 11/06/2023 0838   CREATININE 0.90 06/16/2016 1128   CALCIUM 9.1 11/06/2023 0838   PROT 6.8 07/10/2023 0842   ALBUMIN 4.0 07/10/2023 0842   AST 12 07/10/2023 0842   ALT 19 07/10/2023 0842   ALKPHOS 75 07/10/2023 0842   BILITOT 0.4 07/10/2023 0842   GFRNONAA >60 09/19/2022 0159   GFRNONAA 73 06/16/2016 1128   GFRAA 118 12/25/2020 0802   GFRAA 84 06/16/2016 1128     Diabetic Labs (most recent): Lab Results  Component Value Date   HGBA1C 8.3 (A) 10/12/2023   HGBA1C 7.6 (A) 07/13/2023  HGBA1C 7.8 (A) 03/04/2023   MICROALBUR 150 09/24/2020   MICROALBUR 6.2 12/26/2015   MICROALBUR 2.9 (H) 11/13/2014     Lipid Panel ( most recent) Lipid Panel     Component Value Date/Time    CHOL 125 07/10/2023 0842   TRIG 131 07/10/2023 0842   HDL 36 (L) 07/10/2023 0842   CHOLHDL 3.5 07/10/2023 0842   CHOLHDL 6.8 06/25/2022 0514   VLDL 61 (H) 06/25/2022 0514   LDLCALC 66 07/10/2023 0842   LDLDIRECT 114.0 02/13/2015 0750   LABVLDL 23 07/10/2023 0842      Lab Results  Component Value Date   TSH 0.597 12/25/2020   TSH 1.01 09/16/2019   TSH 0.718 12/26/2015   TSH 0.950 09/15/2015   TSH 0.95 11/13/2014   TSH 1.020 10/25/2013   FREET4 0.99 12/25/2020   FREET4 0.98 12/26/2015   FREET4 0.98 10/25/2013      Assessment & Plan:   1. DM type 2 causing vascular disease (HCC) - Janya Surti has currently uncontrolled symptomatic type 2 DM since  62 years of age.  Jermica presents with her CGM device showing average blood glucose of 198 mg per DL.  Her overview shows 38% time in range, 49% level 1 hyperglycemia, 13% level 2 hyperglycemia.  She did not document any hypoglycemia.  Her recent previsit labs show A1c of 8.3% increasing from 7.6%.    - I had a long discussion with her about the progressive nature of diabetes and the pathology behind its complications. -her diabetes is complicated by CVA, coronary artery disease now status post stent placement, peripheral neuropathy and she remains at a high risk for more acute and chronic complications which include CAD, CVA, CKD, retinopathy, and neuropathy. These are all discussed in detail with her.  - I have counseled her on diet  and weight management  by adopting a carbohydrate restricted/protein rich diet. Patient is encouraged to switch to  unprocessed or minimally processed     complex starch and increased protein intake (animal or plant source), fruits, and vegetables. -  she is advised to stick to a routine mealtimes to eat 3 meals  a day and avoid unnecessary snacks ( to snack only to correct hypoglycemia).   - she acknowledges that there is a room for improvement in her food and drink choices. - Suggestion is made for  her to avoid simple carbohydrates  from her diet including Cakes, Sweet Desserts, Ice Cream, Soda (diet and regular), Sweet Tea, Candies, Chips, Cookies, Store Bought Juices, Alcohol , Artificial Sweeteners,  Coffee Creamer, and "Sugar-free" Products, Lemonade. This will help patient to have more stable blood glucose profile and potentially avoid unintended weight gain.  The following Lifestyle Medicine recommendations according to American College of Lifestyle Medicine  [facility]) were discussed and and offered to patient and she  agrees to start the journey:  A. Whole Foods, Plant-Based Nutrition comprising of fruits and vegetables, plant-based proteins, whole-grain carbohydrates was discussed in detail with the patient.   A list for source of those nutrients were also provided to the patient.  Patient will use only water or unsweetened tea for hydration. B.  The need to stay away from risky substances including alcohol, smoking; obtaining 7 to 9 hours of restorative sleep, at least 150 minutes of moderate intensity exercise weekly, the importance of healthy social connections,  and stress management techniques were discussed. C.  A full color page of  Calorie density of various food groups per pound showing examples  of each food groups was provided to the patient.   - she reports that she has had enough encounters with dietitian in the past.    - I have approached her with the following individualized plan to manage  her diabetes and patient agrees:   -She has not engaged with lifestyle medicine yet.   -Based on her presentation, she was still not need prandial insulin.  She is advised to continue Semglee 80 units nightly.  I discussed and increase her Mounjaro to 12.5 mg subcutaneously weekly.  She is advised to continue metformin 1000 mg p.o. twice daily.  She did not tolerate SGLT2 inhibitors.  - she is encouraged to call clinic for blood glucose levels less than 70 or above 200 mg /dl.   -  Specific targets for  A1c;  LDL, HDL,  and Triglycerides were discussed with the patient.  2) Blood Pressure /Hypertension:  -Her blood pressure is controlled to target.  She has a chance to minimize her blood pressure medications on subsequent visits.  The above recommended plant dominant whole food approach will address hypertension, hyperlipidemia as well.   she is advised to continue her current medications including lisinopril 10 mg p.o. daily, metoprolol 25 mg p.o. twice daily.   3) Lipids/Hyperlipidemia:   Review of her recent lipid panel showed improved LDL at 66.  She is advised to continue atorvastatin 40 mg p.o. nightly.  Side effects and precautions discussed with her.     4)  Weight/Diet:  Body mass index is 39.27 kg/m.  -   clearly complicating her diabetes care.   she is  a candidate for weight loss. I discussed with her the fact that loss of 5 - 10% of her  current body weight will have the most impact on her diabetes management.  Exercise, and detailed carbohydrates information provided  -  detailed on discharge instructions.   5) Chronic Care/Health Maintenance:  -she  is on ACEI/ARB and Statin medications and  is encouraged to initiate and continue to follow up with Ophthalmology, Dentist,  Podiatrist at least yearly or according to recommendations, and advised to   stay away from smoking. I have recommended yearly flu vaccine and pneumonia vaccine at least every 5 years; moderate intensity exercise for up to 150 minutes weekly; and  sleep for at least 7 hours a day.  Her diabetes foot exam today is significant for partial  neuropathy.   - she is  advised to maintain close follow up with Marcine Matar, MD for primary care needs, as well as her other providers for optimal and coordinated care.   I spent  41  minutes in the care of the patient today including review of labs from CMP, Lipids, Thyroid Function, Hematology (current and previous including abstractions from  other facilities); face-to-face time discussing  her blood glucose readings/logs, discussing hypoglycemia and hyperglycemia episodes and symptoms, medications doses, her options of short and long term treatment based on the latest standards of care / guidelines;  discussion about incorporating lifestyle medicine;  and documenting the encounter. Risk reduction counseling performed per USPSTF guidelines to reduce obesity and cardiovascular risk factors.     Please refer to Patient Instructions for Blood Glucose Monitoring and Insulin/Medications Dosing Guide"  in media tab for additional information. Please  also refer to " Patient Self Inventory" in the Media  tab for reviewed elements of pertinent patient history.  Kelly Lang participated in the discussions, expressed understanding, and voiced agreement with the  above plans.  All questions were answered to her satisfaction. she is encouraged to contact clinic should she have any questions or concerns prior to her return visit.    Follow up plan: - Return in about 4 months (around 03/12/2024) for Bring Meter/CGM Device/Logs- A1c in Office.  Marquis Lunch, MD [facility] [address] [place], [place] [address] Phone: [phone]  Fax: [phone]    11/12/2023, 5:16 PM  This note was partially dictated with voice recognition software. Similar sounding words can be transcribed inadequately or may not  be corrected upon review.

## 2023-11-16 ENCOUNTER — Other Ambulatory Visit: Payer: Self-pay | Admitting: "Endocrinology

## 2023-11-16 DIAGNOSIS — Z794 Long term (current) use of insulin: Secondary | ICD-10-CM

## 2023-11-19 ENCOUNTER — Other Ambulatory Visit: Payer: Self-pay

## 2023-11-19 DIAGNOSIS — E1142 Type 2 diabetes mellitus with diabetic polyneuropathy: Secondary | ICD-10-CM

## 2023-11-19 MED ORDER — INSULIN GLARGINE-YFGN 100 UNIT/ML ~~LOC~~ SOPN
80.0000 [IU] | PEN_INJECTOR | Freq: Every day | SUBCUTANEOUS | 0 refills | Status: DC
Start: 2023-11-19 — End: 2024-01-12

## 2023-11-22 ENCOUNTER — Ambulatory Visit (HOSPITAL_COMMUNITY)
Admission: EM | Admit: 2023-11-22 | Discharge: 2023-11-22 | Disposition: A | Discharge status: Home / Self Care | Payer: BC Managed Care – PPO | Attending: Nurse Practitioner | Admitting: Nurse Practitioner

## 2023-11-22 ENCOUNTER — Encounter (HOSPITAL_COMMUNITY): Payer: Self-pay

## 2023-11-22 ENCOUNTER — Ambulatory Visit (INDEPENDENT_AMBULATORY_CARE_PROVIDER_SITE_OTHER): Payer: BC Managed Care – PPO

## 2023-11-22 DIAGNOSIS — R062 Wheezing: Secondary | ICD-10-CM | POA: Diagnosis not present

## 2023-11-22 DIAGNOSIS — J069 Acute upper respiratory infection, unspecified: Secondary | ICD-10-CM | POA: Diagnosis not present

## 2023-11-22 LAB — POC COVID19/FLU A&B COMBO
Covid Antigen, POC: NEGATIVE
Influenza A Antigen, POC: NEGATIVE
Influenza B Antigen, POC: NEGATIVE

## 2023-11-22 MED ORDER — METHYLPREDNISOLONE ACETATE 40 MG/ML IJ SUSP
INTRAMUSCULAR | Status: AC
  Filled 2023-11-22: qty 1

## 2023-11-22 MED ORDER — IPRATROPIUM-ALBUTEROL 0.5-2.5 (3) MG/3ML IN SOLN
RESPIRATORY_TRACT | Status: AC
  Filled 2023-11-22: qty 3

## 2023-11-22 MED ORDER — IPRATROPIUM-ALBUTEROL 0.5-2.5 (3) MG/3ML IN SOLN
3.0000 mL | Freq: Once | RESPIRATORY_TRACT | Status: AC
  Administered 2023-11-22: 3 mL via RESPIRATORY_TRACT

## 2023-11-22 MED ORDER — METHYLPREDNISOLONE ACETATE 40 MG/ML IJ SUSP
20.0000 mg | Freq: Once | INTRAMUSCULAR | Status: AC
  Administered 2023-11-22: 20 mg via INTRAMUSCULAR

## 2023-11-22 MED ORDER — BENZONATATE 100 MG PO CAPS: 100.0000 mg | ORAL_CAPSULE | Freq: Three times a day (TID) | ORAL | 0 refills | Status: DC | PRN

## 2023-11-22 MED ORDER — ALBUTEROL SULFATE HFA 108 (90 BASE) MCG/ACT IN AERS: 1.0000 | INHALATION_SPRAY | Freq: Four times a day (QID) | RESPIRATORY_TRACT | 0 refills | Status: DC | PRN

## 2023-11-22 NOTE — Discharge Instructions (Addendum)
We gave you a DuoNeb today and injection of steroid medicine to help with your breathing.  R chest x-ray ecommend using albuterol inhaler every 4-6 hours around-the-clock for the next 2 days, then use as needed.  COVID-19 and influenza test today is negative.  Chest xray is also negative for signs of pneumonia.  You most likely have a viral upper respiratory infection.  Symptoms should improve over the next week to 10 days.  If chest pain or shortness of breath worsens, seek care emergently.  Some things that can make you feel better are: - Increased rest - Increasing fluid with water/sugar free electrolytes - Acetaminophen and ibuprofen as needed for fever/pain - Salt water gargling, chloraseptic spray and throat lozenges - OTC guaifenesin (Mucinex) 600 mg twice daily for congestion - Saline sinus flushes or a neti pot - Humidifying the air -Tessalon Perles every 8 hours as needed for dry cough

## 2023-11-22 NOTE — ED Provider Notes (Signed)
[facility]    CSN: [id number] Arrival date & time: 11/22/23  1257      History   Chief Complaint Chief Complaint  Patient presents with   Cough    HPI Kelly Lang is a 62 y.o. female.   Patient presents today with 3-day history of cough, fever Tmax 100.9 F, shortness of breath, wheezing, stuffy nose, sore throat, slight headache, bilateral ear pain, abdominal pain and rib cage pain from coughing, decreased appetite, and fatigue.  Also endorses body ache, chills, and night sweats.  No chest pain or tightness, ear drainage or decreased hearing from her ears, nausea/vomiting, diarrhea.  No known sick contacts.  Has been using expired albuterol inhaler, DayQuil/NyQuil HBP, and mother's nebulizer.  Reports her mother's nebulizer helped the most with her symptoms.  Patient reports she has needed inhaler in the past when she is sick.  Has not been formally diagnosed with any lung disease.  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    Past Medical History:  Diagnosis Date   Arthritis    "all over" (06/30/2017)   Chicken pox    Dyslipidemia    Dysrhythmia ablation 2018   a-fib   GERD (gastroesophageal reflux disease)    H/O: hypothyroidism    a. thyroid biopsy 1996 with synthroid. Stopped taking rx and was loss to follow up b. normal thyroid function 10/20/13   High cholesterol    Hx of cardiovascular stress test    ETT/Lexiscan Myoview (12/2013):  No ischemia; not gated; low risk.   Hypertension    Obesity    Persistent atrial fibrillation (HCC)    a diagnosed 10/25/2013   Psoriasis    on Skyrizi   Seasonal allergies    Stroke [facility]) 2016   "some speech problems since" (06/30/2017)   Tachycardia induced cardiomyopathy (HCC)    a. 2/2 Afib with RVR for unknown duration (EF: 40-45%)   Thyroid disease    Type II diabetes mellitus (HCC)    a. hg A1c 10, newly diagnosed (10/26/13)    Patient Active Problem List    Diagnosis Date Noted   Polyarthralgia 10/12/2023   Insulin long-term use (HCC) 07/13/2023   PAF (paroxysmal atrial fibrillation) (HCC)    Class 2 obesity 06/25/2022   NSTEMI (non-ST elevated myocardial infarction) (HCC)    Thyroid nodule 06/22/2022   Trigger ring finger of left hand 02/05/2021   Trigger little finger of right hand 02/05/2021   Mixed hyperlipidemia 09/17/2020   Multinodular goiter 09/16/2019   Chronic pain disorder 08/09/2018   Diabetic polyneuropathy associated with type 2 diabetes mellitus (HCC) 08/09/2018   GI bleed 12/06/2017   Hepatic steatosis 12/04/2017   Cerebrovascular accident (CVA) due to 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)    Low back pain 04/03/2014   Vitamin D deficiency 01/20/2014   Essential hypertension, benign 01/20/2014   H/O: hypothyroidism    Class 2 severe obesity due to excess calories with serious comorbidity and body mass index (BMI) of 37.0 to 37.9 in adult [facility])    DM type 2 causing vascular disease (HCC)    Arthritis    Atrial fibrillation  10/25/2013    Past Surgical History:  Procedure Laterality Date   ATRIAL FIBRILLATION ABLATION  06/30/2017   ATRIAL FIBRILLATION ABLATION N/A 06/30/2017   Procedure: Atrial Fibrillation Ablation;  Surgeon: Hillis Range, MD;  Location: [facility];  Service: Cardiovascular;  Laterality: N/A;   BICEPT TENODESIS Right  05/24/2020   Procedure: BICEPS TENODESIS;  Surgeon: Cammy Copa, MD;  Location: [facility];  Service: Orthopedics;  Laterality: Right;   BIOPSY THYROID  1996   CARDIOVERSION N/A 12/07/2013   Procedure: CARDIOVERSION;  Surgeon: Everette Rank, MD;  Location: [facility] END[facility]COPY;  Service: Cardiovascular;  Laterality: N/A;   CARDIOVERSION N/A 03/10/2016   Procedure: CARDIOVERSION;  Surgeon: Vesta Mixer, MD;  Location: [facility] ENDOSCOPY;  Service: Cardiovascular;  Laterality: N/A;   CORONARY ATHERECTOMY N/A 09/18/2022   Procedure: CORONARY ATHERECTOMY;  Surgeon:  Tonny Bollman, MD;  Location: [facility];  Service: Cardiovascular;  Laterality: N/A;   CORONARY BALLOON ANGIOPLASTY N/A 06/24/2022   Procedure: CORONARY BALLOON ANGIOPLASTY;  Surgeon: Lyn Records, MD;  Location: [facility];  Service: Cardiovascular;  Laterality: N/A;   CORONARY STENT INTERVENTION N/A 09/18/2022   Procedure: CORONARY STENT INTERVENTION;  Surgeon: Tonny Bollman, MD;  Location: [facility] INVASIVE CV LAB;  Service: Cardiovascular;  Laterality: N/A;   ESOPHAGOGASTRODUODENOSCOPY N/A 12/07/2017   Procedure: ESOPHAGOGASTRODUODENOSCOPY (EGD);  Surgeon: Hilarie Fredrickson, MD;  Location: [facility] ENDOSCOPY;  Service: Endoscopy;  Laterality: N/A;   EYE MUSCLE SURGERY Right ~ 1966   LEFT HEART CATH AND CORONARY ANGIOGRAPHY N/A 06/24/2022   Procedure: LEFT HEART CATH AND CORONARY ANGIOGRAPHY;  Surgeon: Lyn Records, MD;  Location: [facility];  Service: Cardiovascular;  Laterality: N/A;   SHOULDER ARTHROSCOPY WITH ROTATOR CUFF REPAIR Right 05/24/2020   Procedure: right shoulder arthroscopy, rotator cuff tear repair biceps tenodesis;  Surgeon: Cammy Copa, MD;  Location: [facility];  Service: Orthopedics;  Laterality: Right;   TEE WITHOUT CARDIOVERSION N/A 06/29/2017   Procedure: TRANSESOPHAGEAL ECHOCARDIOGRAM (TEE);  Surgeon: Pricilla Riffle, MD;  Location: [facility] ENDOSCOPY;  Service: Cardiovascular;  Laterality: N/A;   TONSILLECTOMY  1971    OB History     Gravida  0   Para  0   Term  0   Preterm  0   AB  0   Living  0      SAB  0   IAB  0   Ectopic  0   Multiple  0   Live Births  0            Home Medications    Prior to Admission medications   Medication Sig Start Date End Date Taking? Authorizing Provider  albuterol (VENTOLIN HFA) 108 (90 Base) MCG/ACT inhaler Inhale 1-2 puffs into the lungs every 6 (six) hours as needed for wheezing or shortness of breath. 11/22/23  Yes Valentino Nose, NP  benzonatate (TESSALON) 100 MG  capsule Take 1 capsule (100 mg total) by mouth 3 (three) times daily as needed for cough. Do not take with alcohol or while driving or operating heavy machinery.  May cause drowsiness. 11/22/23  Yes Valentino Nose, NP  acetaminophen (TYLENOL) 500 MG tablet Take 1,000 mg by mouth every 6 (six) hours as needed for headache.    [provider]  atorvastatin (LIPITOR) 80 MG tablet Take 1 tablet by mouth once daily 07/10/23   Nahser, [name], MD  clopidogrel (PLAVIX) 75 MG tablet Take 1 tablet by mouth once daily with breakfast 07/23/23   Nahser, [name], MD  Continuous Blood Gluc Receiver (DEXCOM G7 RECEIVER) DEVI Use to monitor BG continuously 03/12/22   Nida, Denman George, MD  Continuous Glucose Sensor (DEXCOM G7 SENSOR) MISC USE AS DIRECTED CHANGE  EVERY  10  DAYS. 09/01/23  Roma Kayser, MD  diclofenac Sodium (VOLTAREN) 1 % GEL Apply 2 g topically 4 (four) times daily. 09/08/22   Marcine Matar, MD  DULoxetine (CYMBALTA) 60 MG capsule Take 1 capsule (60 mg total) by mouth daily. 10/12/23   Marcine Matar, MD  insulin glargine-yfgn (SEMGLEE, YFGN,) 100 UNIT/ML Pen Inject 80 Units into the skin at bedtime. 11/19/23   Roma Kayser, MD  isosorbide mononitrate (IMDUR) 30 MG 24 hr tablet Take 1 tablet by mouth once daily 07/01/23   Nahser, [name], MD  lisinopril (ZESTRIL) 10 MG tablet Take 1 tablet by mouth once daily 12/23/22   Marcine Matar, MD  loratadine (CLARITIN) 10 MG tablet Take 10 mg by mouth daily. 08/10/23   [provider]  metFORMIN (GLUCOPHAGE) 1000 MG tablet TAKE 1 TABLET BY MOUTH TWICE DAILY WITH A MEAL 06/18/23   Marcine Matar, MD  metoprolol succinate (TOPROL-XL) 25 MG 24 hr tablet Take 3 tablets by mouth once daily 07/01/23   Nahser, [name], MD  [facility] DELICA LANCETS 33G MISC Use as instructed to check blood sugar up to 3 times daily. 10/05/18   Marcine Matar, MD  ONETOUCH VERIO test strip USE STRIPS TO CHECK GLUCOSE UP TO  THREE TIMES DAILY AS DIRECTED 12/14/20   Marcine Matar, MD  pantoprazole (PROTONIX) 40 MG tablet Take 1 tablet (40 mg total) by mouth daily. 06/26/22   Arrien, York Ram, MD  rivaroxaban (XARELTO) 20 MG TABS tablet TAKE 1 TABLET BY MOUTH ONCE DAILY WITH SUPPER 07/30/23   Nahser, [name], MD  SKYRIZI PEN 150 MG/ML SOAJ Inject 150 mg into the skin every 3 (three) months. 06/18/22   [provider]  spironolactone (ALDACTONE) 25 MG tablet Take 1 tablet (25 mg total) by mouth daily. 10/26/23   Nahser, [name], MD  tirzepatide [facility]) 12.5 MG/0.5ML Pen Inject 12.5 mg into the skin once a week. 11/12/23   Roma Kayser, MD  TRUEPLUS PEN NEEDLES 32G X 4 MM MISC USE AS DIRECTED AT BEDTIME. 08/23/20   Marcine Matar, MD    Family History Family History  Adopted: Yes  Problem Relation Age of Onset   Hypertension Mother    Hyperlipidemia Mother     Social History Social History   Tobacco Use   Smoking status: Former    Current packs/day: 0.00    Average packs/day: 1 pack/day for 22.0 years (22.0 ttl pk-yrs)    Types: Cigarettes    Start date: 12/01/1988    Quit date: 12/01/2010    Years since quitting: 12.9   Smokeless tobacco: Never  Vaping Use   Vaping status: Never Used  Substance Use Topics   Alcohol use: Yes    Comment: social   Drug use: No     Allergies   Jardiance [empagliflozin] and Codeine   Review of Systems Review of Systems Per HPI  Physical Exam Triage Vital Signs ED Triage Vitals  Encounter Vitals Group     BP 11/22/23 1355 (!) 150/82     Systolic BP Percentile --      Diastolic BP Percentile --      Pulse Rate 11/22/23 1355 61     Resp 11/22/23 1355 16     Temp 11/22/23 1355 98.9 F (37.2 C)     Temp Source 11/22/23 1355 Oral     SpO2 11/22/23 1355 93 %     Weight 11/22/23 1354 230 lb (104.3 kg)  Height 11/22/23 1354 5' 5.5" (1.664 m)     Head Circumference --      Peak Flow --      Pain Score 11/22/23 1354 0     Pain  Loc --      Pain Education --      Exclude from Growth Chart --    No data found.  Updated Vital Signs BP (!) 150/82 (BP Location: Left Arm)   Pulse 61   Temp 98.9 F (37.2 C) (Oral)   Resp 16   Ht 5' 5.5" (1.664 m)   Wt 230 lb (104.3 kg)   LMP 04/15/2015 (LMP Unknown) Comment: last in may  SpO2 93%   BMI 37.69 kg/m   Visual Acuity Right Eye Distance:   Left Eye Distance:   Bilateral Distance:    Right Eye Near:   Left Eye Near:    Bilateral Near:     Physical Exam Vitals and nursing note reviewed.  Constitutional:      General: She is not in acute distress.    Appearance: Normal appearance. She is not ill-appearing or toxic-appearing.  HENT:     Head: Normocephalic and atraumatic.     Right Ear: Tympanic membrane, ear canal and external ear normal.     Left Ear: Tympanic membrane, ear canal and external ear normal.     Nose: Congestion present. No rhinorrhea.     Mouth/Throat:     Mouth: Mucous membranes are moist.     Pharynx: Oropharynx is clear. No oropharyngeal exudate or posterior oropharyngeal erythema.  Eyes:     General: No scleral icterus.    Extraocular Movements: Extraocular movements intact.  Cardiovascular:     Rate and Rhythm: Normal rate and regular rhythm.  Pulmonary:     Effort: Pulmonary effort is normal. No respiratory distress.     Breath sounds: Decreased air movement present. No wheezing, rhonchi or rales.  Musculoskeletal:     Cervical back: Normal range of motion and neck supple.  Lymphadenopathy:     Cervical: No cervical adenopathy.  Skin:    General: Skin is warm and dry.     Coloration: Skin is not jaundiced or pale.     Findings: No erythema or rash.  Neurological:     Mental Status: She is alert and oriented to person, place, and time.  Psychiatric:        Behavior: Behavior is cooperative.      UC Treatments / Results  Labs (all labs ordered are listed, but only abnormal results are displayed) Labs Reviewed  POC  COVID19/FLU A&B COMBO    EKG   Radiology DG Chest 2 View Result Date: 11/22/2023 CLINICAL DATA:  cough and wheezing EXAM: CHEST - 2 VIEW COMPARISON:  06/21/2022 chest radiograph. FINDINGS: Stable cardiomediastinal silhouette with top normal heart size. No pneumothorax. Probable trace bilateral pleural effusions. Borderline mild pulmonary edema. No consolidative airspace disease. IMPRESSION: Borderline mild pulmonary edema. Probable trace bilateral pleural effusions. Electronically Signed   By: Delbert Phenix M.D.   On: 11/22/2023 15:23    Procedures Procedures (including critical care time)  Medications Ordered in UC Medications  ipratropium-albuterol (DUONEB) 0.5-2.5 (3) MG/3ML nebulizer solution 3 mL (3 mLs Nebulization Given 11/22/23 1446)  methylPREDNISolone acetate (DEPO-MEDROL) injection 20 mg (20 mg Intramuscular Given 11/22/23 1503)    Initial Impression / Assessment and Plan / UC Course  I have reviewed the triage vital signs and the nursing notes.  Pertinent labs & imaging results that were  available during my care of the patient were reviewed by me and considered in my medical decision making (see chart for details).   Patient is well-appearing, normotensive, afebrile, not tachycardic, not tachypneic, oxygenating well on room air.    1. Viral URI with cough 2. Wheezing Overall, vitals and exam are reassuring DuoNeb breathing treatment and Depo-Medrol 20 mg IM given in urgent care today for decreased air movement, patient with increased wheezing thereafter We will hold off on oral corticosteroids given history of diabetes Chest x-ray obtained showing borderline mild pulmonary edema, trace bilateral pleural effusions which appears to be stable when compared with previous EKG July 2023; no pneumonia COVID-19 and influenza testing is negative Treat with continued albuterol inhaler every 4-6 hours around-the-clock for the next 2 days, then use as needed Start cough suppressant  medication Will defer antibiotic therapy at this time as patient does not have history of chronic lung disease, she is early in illness, and x-ray is negative Return and ER precautions discussed with patient  The patient was given the opportunity to ask questions.  All questions answered to their satisfaction.  The patient is in agreement to this plan.  Final Clinical Impressions(s) / UC Diagnoses   Final diagnoses:  Viral URI with cough  Wheezing     Discharge Instructions      We gave you a DuoNeb today and injection of steroid medicine to help with your breathing.  R chest x-ray ecommend using albuterol inhaler every 4-6 hours around-the-clock for the next 2 days, then use as needed.  COVID-19 and influenza test today is negative.  Chest xray is also negative for signs of pneumonia.  You most likely have a viral upper respiratory infection.  Symptoms should improve over the next week to 10 days.  If chest pain or shortness of breath worsens, seek care emergently.  Some things that can make you feel better are: - Increased rest - Increasing fluid with water/sugar free electrolytes - Acetaminophen and ibuprofen as needed for fever/pain - Salt water gargling, chloraseptic spray and throat lozenges - OTC guaifenesin (Mucinex) 600 mg twice daily for congestion - Saline sinus flushes or a neti pot - Humidifying the air -Tessalon Perles every 8 hours as needed for dry cough      ED Prescriptions     Medication Sig Dispense Auth. Provider   albuterol (VENTOLIN HFA) 108 (90 Base) MCG/ACT inhaler Inhale 1-2 puffs into the lungs every 6 (six) hours as needed for wheezing or shortness of breath. 18 g Cathlean Marseilles A, NP   benzonatate (TESSALON) 100 MG capsule Take 1 capsule (100 mg total) by mouth 3 (three) times daily as needed for cough. Do not take with alcohol or while driving or operating heavy machinery.  May cause drowsiness. 21 capsule Valentino Nose, NP      PDMP not  reviewed this encounter.   Valentino Nose, NP 11/23/23 1436

## 2023-11-22 NOTE — ED Triage Notes (Signed)
Patient here today with c/o cough, wheeze, fever, SOB, body aches, chills, and night sweats X 3 days. She has been taking Dayquil and Nyquil with little relief. No sick contacts.

## 2023-11-26 ENCOUNTER — Ambulatory Visit (HOSPITAL_COMMUNITY): Payer: BC Managed Care – PPO | Attending: Cardiology

## 2023-11-26 DIAGNOSIS — Z79899 Other long term (current) drug therapy: Secondary | ICD-10-CM | POA: Diagnosis present

## 2023-11-26 DIAGNOSIS — R0609 Other forms of dyspnea: Secondary | ICD-10-CM | POA: Insufficient documentation

## 2023-11-26 DIAGNOSIS — I2510 Atherosclerotic heart disease of native coronary artery without angina pectoris: Secondary | ICD-10-CM | POA: Diagnosis present

## 2023-11-26 DIAGNOSIS — E782 Mixed hyperlipidemia: Secondary | ICD-10-CM | POA: Insufficient documentation

## 2023-11-26 LAB — ECHOCARDIOGRAM COMPLETE
Area-P 1/2: 3.92 cm2
S' Lateral: 4.2 cm

## 2023-11-30 ENCOUNTER — Other Ambulatory Visit: Payer: Self-pay | Admitting: "Endocrinology

## 2023-12-18 ENCOUNTER — Other Ambulatory Visit: Payer: Self-pay | Admitting: Internal Medicine

## 2023-12-18 DIAGNOSIS — E1169 Type 2 diabetes mellitus with other specified complication: Secondary | ICD-10-CM

## 2023-12-18 DIAGNOSIS — I152 Hypertension secondary to endocrine disorders: Secondary | ICD-10-CM

## 2023-12-23 ENCOUNTER — Other Ambulatory Visit: Payer: Self-pay | Admitting: Cardiovascular Disease

## 2023-12-30 ENCOUNTER — Other Ambulatory Visit: Payer: Self-pay | Admitting: "Endocrinology

## 2024-01-12 ENCOUNTER — Other Ambulatory Visit: Payer: Self-pay

## 2024-01-12 DIAGNOSIS — Z794 Long term (current) use of insulin: Secondary | ICD-10-CM

## 2024-01-12 MED ORDER — INSULIN GLARGINE-YFGN 100 UNIT/ML ~~LOC~~ SOPN: 80.0000 [IU] | PEN_INJECTOR | Freq: Every day | SUBCUTANEOUS | 0 refills | Status: DC

## 2024-01-12 NOTE — Telephone Encounter (Signed)
Left a message requesting pt return call to the office.

## 2024-01-18 ENCOUNTER — Other Ambulatory Visit: Payer: Self-pay

## 2024-01-18 DIAGNOSIS — E1159 Type 2 diabetes mellitus with other circulatory complications: Secondary | ICD-10-CM

## 2024-01-18 MED ORDER — INSULIN GLARGINE 100 UNIT/ML SOLOSTAR PEN: 80.0000 [IU] | PEN_INJECTOR | Freq: Every day | SUBCUTANEOUS | 0 refills | Status: DC

## 2024-01-28 ENCOUNTER — Other Ambulatory Visit: Payer: Self-pay | Admitting: Cardiovascular Disease

## 2024-01-28 DIAGNOSIS — I4891 Unspecified atrial fibrillation: Secondary | ICD-10-CM

## 2024-01-28 NOTE — Telephone Encounter (Signed)
 Prescription refill request for Xarelto received.  Indication:  Last office visit: 10/26/23 (Nahser)  Weight: 104.3kg Age: 63 Scr: 0.69 (11/06/23)  CrCl: 143.54ml/min Appropriate dose. Refill sent.

## 2024-02-01 ENCOUNTER — Encounter: Payer: Self-pay | Admitting: Cardiovascular Disease

## 2024-02-01 ENCOUNTER — Ambulatory Visit: Payer: BC Managed Care – PPO | Admitting: Cardiovascular Disease

## 2024-02-01 NOTE — Progress Notes (Unsigned)
 Cardiology Office Note   Date:  02/02/2024   ID:  Kelly Herandez, DOB 11-May-1961, MRN [id number]  PCP:  Marcine Matar, MD  Cardiologist:   Kristeen Miss, MD   Chief Complaint  Patient presents with   Coronary Artery Disease        Atrial Fibrillation        1. Paroxysmal Atrial fibrillation 2. Hypothyroidism 3. Type 2 diabetes mellitus 4. Chronic systolic CHF 5.  CAD  6. Obesity     Kelly Lang is a 63 y.o. female with a hx of T2DM, HL, thyroid disease and atrial fibrillation.  She had been on Synthroid for some time after a thyroid bx in 1996.  She was admitted 10/2013 with AFib with RVR.  Echo (10/26/13):  EF 40-45%, normal wall motion, mild LAE.  CHADS2-VASc= 3 (LV dysfunction, DM, female).  She was placed on coumadin.  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  She was last seen 11/29/2013.  She was set up for DCCV after 3-4 weeks of appropriate anticoagulation.  DCCV was performed 12/07/2013 but was unsuccessful.  She returns for follow up.  She is stable. She is frustrated with her atrial fibrillation. She does get tired more easily. She denies chest pain. She denies significant dyspnea. She is NYHA class II. She denies syncope. She denies orthopnea, PND, edema, palpitations.  Feb. 16, 2015:  She had a cardioversion twice.  Converted back to Afib both times. She has had a stress myoview which was negative for ischemia.    we will start flecainide today.  February 15, 2014:  Kelly Lang returns for further evaluation of her atrial fib.  She has been on Flecainide 100 BID for a month.  She started feeling better about 2 weeks ago.  She has converted to NSR.   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    Sept. 16, 2015:  Kelly Lang is doing well.  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     February 13, 2015:  Kelly Lang is a 63 y.o. female who presents for follow-up of atrial fibrillation. Is on  coumadin . INR is 2.0 this am.   No CP or dyspnea.   Glucose levels are ok.   HbA1C is a bit high . Was started on Tragenta. Only a little bit of exercise.   Missed 4 days of flecainide due to insurance not paying for it. Staying in NSR  Nov. 29, 2016:  Had a CVA in Oct, 2016.  Had not been on her coumadin and all of her medications.  Echo revealed severe LV dysfunction with EF of 20-25%.   Was started on Xarelto and metoprolol at that time   Has had some some chills and achy feeling for the past couple of days. No cough, no fever, no runny nose.  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     February 08, 2016:  Kelly Lang has been back on her meds.   She had stopped her medications and had developed worsening congestive heart failure. Previous echocardiogram revealed an EF of 25%. She restart her medications and the recent echo 2 days ago reveals recovery of her left ventricular systolic function to an ejection fraction 45% which is her normal.  She's feeling quite a  bit better. She has taken flecainide in the past which helped her stay in normal sinus rhythm. She stopped the flecainide due to lack of insurance. She now has [employer] and wants to get back on flecainide.  March 07, 2016:  Kelly Lang is seen back today for follow-up of her atrial fibrillation. She's been on flecainide 100 mg twice a day.  She's had her stress test for flecainide testing and it revealed no QRS widening. She had an unsuccessful cardioversion Jan. 7, 2015 She cannot tell how much she is in NSR vs. Afib.   Oct. 9, 2017:  She is s/p cardioversion in April She is feeling better.  Able to walk without any dyspnea   May 18, 2017:  Kelly Lang is seen back today for follow up  No CP or dyspnea  Is now on Jardiance for the past week   Apr 12, 2018  Kelly Lang is seen for follow up  visit  Has had an ablation since I last  Feels so much better.    Able to walk but not getting any regular exercise  Still on Xarelto.  Body mass index is 39.88 kg/m.  October 05, 2019: Kelly Lang is seen today for follow-up visit.  He has a history of paroxysmal atrial fibrillation, obesity, hyperlipidemia and diabetes mellitus. Is not exercising much .  Working on getting diabetes regulated   Wt is 250 ( up 6 lbs)  Is s/p AFIB ablation    February 26, 2021:  Kelly Lang is seen today - hx of atrial fib, - s/p ablation Hx of HLD, DM, obesity Wt is 235 lbs   Chol levels are ok  Chol = 115 HDL = 32 LDL = 49 Trigs are 208  !!  Is trying to lose some weight   Mar 31, 2022: Kelly Lang is seen for follow  up Is maintaining  NSR   She still on Xarelto.  Weight today is 231 pounds.  She is down 19 pounds from 3 years ago.  Oct. 10, 2023 Kelly Lang is seen for follow up of her atrial fib, obesity, Was hospitalized in July with dyspnea and intrascapular pain  Early in the am Lasted for several hours  Went to work  Recurrent several days later,  went to [employer] [facility], then ultimately to [facility]    Aorta CTA : showed no dissection  Tropononis 106, 80  Heart catheterization on June 24, 2022 revealed 100% occlusion of the proximal to mid LAD.  This was a very heavily calcified segment.  She has right to left collaterals that fill the LAD in a retrograde fashion.  There is thrombus noted just distal to the high-grade obstruction.  The left circumflex and the right coronary artery are normal.  LV function was mildly reduced with an EF of 45 to 50%.  The proximal LAD lesion was crossed with a wire.  There was no balloon that was able to cross.  The plan was to anticoagulate for 4 to 6 weeks and then consider repeat procedure by the CTO team.  There was also consideration to perform a orbital atherectomy prior to stenting.   Still has the intrascapular pain with exertion , which is  associated with dyspnea   Jan. 19, 2024 Kelly Lang had PCI of her prox LAD , complicated by distal embolization  Hx of PAF Is exercising only a little bit Is working quite a [employer] is 234 Still struggling with her weight. She does not have any  acute problems.  Will have her see Marcelino Duster, NP in 3 months for further counseling on diet, exercise, weight loss  Nov. 25 , 2024  Kelly Lang is here for follow-up of her coronary artery disease and atrial fibrillation.  Weight today is 227 pounds which is down 7 pounds from her last visit in January.  Has had some dyspnea with exertion  Had dyspnea moving a banquet chair 5 feet.   Echo from July, 2023 showed LVEF of 45-50%. She is avoiding salt   No CP   February 02, 2024 Kelly Lang is seen for follow up of her CAD, atrial fib  Wt is is 231 lbs   No CP , no dyspnea  No Afib or flutter  Getting a litte exercise   She is on atorvastatin 80 mg a day.  Her last lipids from July 10, 2023 reveal Total cholesterol of 125 HDL is 36 LDL is 66 Triglyceride levels 131  Echocardiogram performed in December reveals mildly reduced LV systolic function. Will start her on Entresto 24-26 twice a day.  She is currently on lisinopril.  Will hold lisinopril for 48 hours and then start Entresto at that time.       Past Medical History:  Diagnosis Date   Arthritis    "all over" (06/30/2017)   Chicken pox    Dyslipidemia    Dysrhythmia ablation 2018   a-fib   GERD (gastroesophageal reflux disease)    H/O: hypothyroidism    a. thyroid biopsy 1996 with synthroid. Stopped taking rx and was loss to follow up b. normal thyroid function 10/20/13   High cholesterol    Hx of cardiovascular stress test    ETT/Lexiscan Myoview (12/2013):  No ischemia; not gated; low risk.   Hypertension    Obesity    Persistent atrial fibrillation (HCC)    a diagnosed 10/25/2013   Psoriasis    on Skyrizi   Seasonal allergies    Stroke [facility]) 2016   "some speech problems since"  (06/30/2017)   Tachycardia induced cardiomyopathy (HCC)    a. 2/2 Afib with RVR for unknown duration (EF: 40-45%)   Thyroid disease    Type II diabetes mellitus (HCC)    a. hg A1c 10, newly diagnosed (10/26/13)    Past Surgical History:  Procedure Laterality Date   ATRIAL FIBRILLATION ABLATION  06/30/2017   ATRIAL FIBRILLATION ABLATION N/A 06/30/2017   Procedure: Atrial Fibrillation Ablation;  Surgeon: Hillis Range, MD;  [facility] CV LAB;  Service: Cardiovascular;  Laterality: N/A;   BICEPT TENODESIS Right 05/24/2020   Procedure: BICEPS TENODESIS;  Surgeon: Cammy Copa, MD;  Location: [facility];  Service: Orthopedics;  Laterality: Right;   BIOPSY THYROID  1996   CARDIOVERSION N/A 12/07/2013   Procedure: CARDIOVERSION;  Surgeon: Everette Rank, MD;  Location: [facility] ENDOSCOPY;  Service: Cardiovascular;  Laterality: N/A;   CARDIOVERSION N/A 03/10/2016   Procedure: CARDIOVERSION;  Surgeon: Vesta Mixer, MD;  Location: [facility] ENDOSCOPY;  Service: Cardiovascular;  Laterality: N/A;   CORONARY ATHERECTOMY N/A 09/18/2022   Procedure: CORONARY ATHERECTOMY;  Surgeon: Tonny Bollman, MD;  Location: [facility] INVASIVE CV LAB;  Service: Cardiovascular;  Laterality: N/A;   CORONARY BALLOON ANGIOPLASTY N/A 06/24/2022   Procedure: CORONARY BALLOON ANGIOPLASTY;  Surgeon: Lyn Records, MD;  [facility]: [facility];  Service: Cardiovascular;  Laterality: N/A;   CORONARY STENT INTERVENTION N/A 09/18/2022   Procedure: CORONARY STENT INTERVENTION;  Surgeon: Tonny Bollman, MD;  Location: [facility] INVASIVE CV LAB;  Service: Cardiovascular;  Laterality: N/A;   ESOPHAGOGASTRODUODENOSCOPY N/A 12/07/2017   Procedure: ESOPHAGOGASTRODUODENOSCOPY (EGD);  Surgeon: Hilarie Fredrickson, MD;  Location: [facility] END[facility]COPY;  Service: Endoscopy;  Laterality: N/A;   EYE MUSCLE SURGERY Right ~ 1966   LEFT HEART CATH AND CORONARY ANGIOGRAPHY N/A 06/24/2022   Procedure: LEFT HEART CATH AND CORONARY ANGIOGRAPHY;  Surgeon:  Lyn Records, MD;  [facility]: [facility];  Service: Cardiovascular;  Laterality: N/A;   SHOULDER ARTHROSCOPY WITH ROTATOR CUFF REPAIR Right 05/24/2020   Procedure: right shoulder arthroscopy, rotator cuff tear repair biceps tenodesis;  Surgeon: Cammy Copa, MD;  Location: [facility];  Service: Orthopedics;  Laterality: Right;   TEE WITHOUT CARDIOVERSION N/A 06/29/2017   Procedure: TRANSESOPHAGEAL ECHOCARDIOGRAM (TEE);  Surgeon: Pricilla Riffle, MD;  Location: [facility] ENDOSCOPY;  Service: Cardiovascular;  Laterality: N/A;   TONSILLECTOMY  1971     Current Outpatient Medications  Medication Sig Dispense Refill   acetaminophen (TYLENOL) 500 MG tablet Take 1,000 mg by mouth every 6 (six) hours as needed for headache.     albuterol (VENTOLIN HFA) 108 (90 Base) MCG/ACT inhaler Inhale 1-2 puffs into the lungs every 6 (six) hours as needed for wheezing or shortness of breath. 18 g 0   atorvastatin (LIPITOR) 80 MG tablet Take 1 tablet by mouth once daily 90 tablet 3   clopidogrel (PLAVIX) 75 MG tablet Take 1 tablet by mouth once daily with breakfast 90 tablet 2   Continuous Blood Gluc Receiver (DEXCOM G7 RECEIVER) DEVI Use to monitor BG continuously 1 each 0   Continuous Glucose Sensor (DEXCOM G7 SENSOR) MISC USE AS DIRECTED CHANGE  EVERY  10  DAYS 3 each 1   diclofenac Sodium (VOLTAREN) 1 % GEL Apply 2 g topically 4 (four) times daily. 100 g 2   DULoxetine (CYMBALTA) 60 MG capsule Take 1 capsule (60 mg total) by mouth daily. 30 capsule 4   insulin glargine (LANTUS) 100 UNIT/ML Solostar Pen Inject 80 Units into the skin at bedtime. 75 mL 0   isosorbide mononitrate (IMDUR) 30 MG 24 hr tablet Take 1 tablet by mouth once daily 90 tablet 3   lisinopril (ZESTRIL) 10 MG tablet Take 1 tablet by mouth once daily 90 tablet 0   loratadine (CLARITIN) 10 MG tablet Take 10 mg by mouth daily.     metFORMIN (GLUCOPHAGE) 1000 MG tablet TAKE 1 TABLET BY MOUTH TWICE DAILY WITH A MEAL 180 tablet 0    metoprolol succinate (TOPROL-XL) 25 MG 24 hr tablet Take 3 tablets by mouth once daily 270 tablet 3   ONETOUCH DELICA LANCETS 33G MISC Use as instructed to check blood sugar up to 3 times daily. 100 each 11   ONETOUCH VERIO test strip USE STRIPS TO CHECK GLUCOSE UP TO THREE TIMES DAILY AS DIRECTED 100 each 6   rivaroxaban (XARELTO) 20 MG TABS tablet TAKE 1 TABLET BY MOUTH ONCE DAILY WITH SUPPER 90 tablet 1   SKYRIZI PEN 150 MG/ML SOAJ Inject 150 mg into the skin every 3 (three) months.     spironolactone (ALDACTONE) 25 MG tablet Take 1 tablet (25 mg total) by mouth daily. 90 tablet 3   tirzepatide (MOUNJARO) 12.5 MG/0.5ML Pen Inject 12.5 mg into the skin once a week. 6 mL 1   TRUEPLUS PEN NEEDLES 32G X 4 MM MISC USE AS DIRECTED AT BEDTIME. 100 each 2   No current facility-administered medications for this visit.    Allergies:   Jardiance [empagliflozin] and Codeine  Social History:  The patient  reports that she quit smoking about 13 years ago. Her smoking use included cigarettes. She started smoking about 35 years ago. She has a 22 pack-year smoking history. She has never used smokeless tobacco. She reports current alcohol use. She reports that she does not use drugs.   Family History:  The patient's family history includes Hyperlipidemia in her mother; Hypertension in her mother. She was adopted.    Physical Exam: Blood pressure 124/76, pulse 82, height 5' 5.5" (1.664 m), weight 231 lb 9.6 oz (105.1 kg), last menstrual period 04/15/2015, SpO2 97%.       GEN:  Well nourished, well developed in no acute distress HEENT: Normal NECK: No JVD; No carotid bruits LYMPHATICS: No lymphadenopathy CARDIAC: RRR , no murmurs, rubs, gallops RESPIRATORY:  Clear to auscultation without rales, wheezing or rhonchi  ABDOMEN: Soft, non-tender, non-distended MUSCULOSKELETAL:  No edema; No deformity  SKIN: Warm and dry NEUROLOGIC:  Alert and oriented x 3   EKG:            Recent  Labs: 07/10/2023: ALT 19 10/12/2023: Hemoglobin 14.0; Platelets 294 11/06/2023: BUN 12; Creatinine, Ser 0.69; Potassium 4.6; Sodium 137    Lipid Panel    Component Value Date/Time   CHOL 125 07/10/2023 0842   TRIG 131 07/10/2023 0842   HDL 36 (L) 07/10/2023 0842   CHOLHDL 3.5 07/10/2023 0842   CHOLHDL 6.8 06/25/2022 0514   VLDL 61 (H) 06/25/2022 0514   LDLCALC 66 07/10/2023 0842   LDLDIRECT 114.0 02/13/2015 0750      Wt Readings from Last 3 Encounters:  02/02/24 231 lb 9.6 oz (105.1 kg)  11/22/23 230 lb (104.3 kg)  11/12/23 236 lb (107 kg)      Other studies Reviewed: Additional studies/ records that were reviewed today include: . Review of the above records demonstrates:    ASSESSMENT AND PLAN:  1. Atrial fibrillation-   remains in NSR      2. CAD :   on plavix, no angina    3. HFrEF:     Echocardiogram performed in December reveals mildly reduced LV systolic function. Will start her on Entresto 24-26 twice a day.  She is currently on lisinopril.  Will hold lisinopril for 48 hours and then start Entresto at that time.   4.   Hyperlipidemia:  lipids in aug looked great    5.  Moderate obesity:    Body mass index is 37.95 kg/m.      Current medicines are reviewed at length with the patient today.  The patient does not have concerns regarding medicines.  The following changes have been made:  no change  Labs/ tests ordered today include:   No orders of the defined types were placed in this encounter.   Disposition:        Kristeen Miss, MD  02/02/2024 10:47 AM    [facility] [address] [place], [place], [place]  [address] Phone: [phone]; Fax: [phone]

## 2024-02-02 ENCOUNTER — Ambulatory Visit: Payer: BC Managed Care – PPO | Attending: Cardiovascular Disease | Admitting: Cardiovascular Disease

## 2024-02-02 ENCOUNTER — Encounter: Payer: Self-pay | Admitting: Cardiovascular Disease

## 2024-02-02 VITALS — BP 124/76 | HR 82 | Ht 65.5 in | Wt 231.6 lb

## 2024-02-02 DIAGNOSIS — E782 Mixed hyperlipidemia: Secondary | ICD-10-CM

## 2024-02-02 DIAGNOSIS — Z79899 Other long term (current) drug therapy: Secondary | ICD-10-CM | POA: Diagnosis not present

## 2024-02-02 DIAGNOSIS — I5020 Unspecified systolic (congestive) heart failure: Secondary | ICD-10-CM

## 2024-02-02 MED ORDER — SACUBITRIL-VALSARTAN 24-26 MG PO TABS: 1.0000 | ORAL_TABLET | Freq: Two times a day (BID) | ORAL | 11 refills | Status: AC

## 2024-02-02 NOTE — Patient Instructions (Signed)
 Medication Instructions:  STOP Lisinopril START Entresto 24/26mg  twice daily (beginning Thursday morning) *If you need a refill on your cardiac medications before your next appointment, please call your pharmacy*  Lab Work: BMET in 3 weeks If you have labs (blood work) drawn today and your tests are completely normal, you will receive your results only by: MyChart Message (if you have MyChart) OR A paper copy in the mail If you have any lab test that is abnormal or we need to change your treatment, we will call you to review the results.  Follow-Up: At [facility], you and your health needs are our priority.  As part of our continuing mission to provide you with exceptional heart care, we have created designated Provider Care Teams.  These Care Teams include your primary Cardiologist (physician) and Advanced Practice Providers (APPs -  Physician Assistants and Nurse Practitioners) who all work together to provide you with the care you need, when you need it.  Your next appointment:   3 month(s)  Provider:   Kristeen Miss, MD      1st Floor: - Lobby - Registration  - Pharmacy  - Lab - Cafe  2nd Floor: - PV Lab - Diagnostic Testing (echo, CT, nuclear med)  3rd Floor: - Vacant  4th Floor: - TCTS (cardiothoracic surgery) - AFib Clinic - Structural Heart Clinic - Vascular Surgery  - Vascular Ultrasound  5th Floor: - HeartCare Cardiology (general and EP) - Clinical Pharmacy for coumadin, hypertension, lipid, weight-loss medications, and med management appointments    Valet parking services will be available as well.

## 2024-02-09 ENCOUNTER — Ambulatory Visit: Payer: BC Managed Care – PPO | Admitting: Internal Medicine

## 2024-02-10 ENCOUNTER — Telehealth: Payer: Self-pay

## 2024-02-10 NOTE — Telephone Encounter (Signed)
Tried to return a call to pt but had to leave a message requesting she return call to the office.

## 2024-02-11 ENCOUNTER — Other Ambulatory Visit: Payer: Self-pay | Admitting: Internal Medicine

## 2024-02-11 ENCOUNTER — Ambulatory Visit: Admitting: Critical Care Medicine

## 2024-02-11 NOTE — Telephone Encounter (Signed)
 Requested medication (s) are due for refill today: yes  Requested medication (s) are on the active medication list: yes  Last refill:  10/26/23  Future visit scheduled: yes  Notes to clinic:  Unable to refill per protocol, last refill by another/historical provider.      Requested Prescriptions  Pending Prescriptions Disp Refills   EQ LORATADINE 10 MG tablet [Pharmacy Med Name: EQ Loratadine 10 MG Oral Tablet] 90 tablet 0    Sig: Take 1 tablet by mouth once daily     Ear, Nose, and Throat:  Antihistamines 2 Passed - 02/11/2024  1:40 PM      Passed - Cr in normal range and within 360 days    Creat  Date Value Ref Range Status  06/16/2016 0.90 0.50 - 1.05 mg/dL Final    Comment:      For patients > or = 61 years of age: The upper reference limit for Creatinine is approximately 13% higher for people identified as African-American.      Creatinine, Ser  Date Value Ref Range Status  11/06/2023 0.69 0.57 - 1.00 mg/dL Final   Creatinine, POC  Date Value Ref Range Status  09/24/2020 300 mg/dL Final         Passed - Valid encounter within last 12 months    Recent Outpatient Visits           4 months ago Type 2 diabetes mellitus with morbid obesity (HCC)   [place] [facility] - A Dept Of [place]. [facility] Jonah Blue B, MD   8 months ago Type 2 diabetes mellitus with morbid obesity [facility])   [place] [facility] - A Dept Of [place]. [facility] Marcine Matar, MD   1 year ago Pap smear for cervical cancer screening   [place] [facility] - A Dept Of [place]. [facility] Marcine Matar, MD   1 year ago 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[facility])   [place] [facility] - A Dept Of [place]. [facility] Marcine Matar, MD   1 year ago 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[facility])   [place] [facility] - A Dept Of [place]. [facility] Marcine Matar,  MD       Future Appointments             In 1 month Laural Benes Binnie Rail, MD [facility] [place] - A Dept Of [place] Bridegroom. [facility]   In 2 months Nahser, [name], MD [facility] at [facility], [facility]

## 2024-02-11 NOTE — Telephone Encounter (Signed)
Left a message requesting a return call to the office. 

## 2024-03-05 ENCOUNTER — Other Ambulatory Visit: Payer: Self-pay | Admitting: Internal Medicine

## 2024-03-05 DIAGNOSIS — F321 Major depressive disorder, single episode, moderate: Secondary | ICD-10-CM

## 2024-03-07 NOTE — Telephone Encounter (Signed)
 Requested Prescriptions  Pending Prescriptions Disp Refills   DULoxetine (CYMBALTA) 60 MG capsule [Pharmacy Med Name: DULoxetine HCl 60 MG Oral Capsule Delayed Release Particles] 30 capsule 0    Sig: Take 1 capsule by mouth once daily     Psychiatry: Antidepressants - SNRI - duloxetine Passed - 03/07/2024 11:57 AM      Passed - Cr in normal range and within 360 days    Creat  Date Value Ref Range Status  06/16/2016 0.90 0.50 - 1.05 mg/dL Final    Comment:      For patients > or = 61 years of age: The upper reference limit for Creatinine is approximately 13% higher for people identified as African-American.      Creatinine, Ser  Date Value Ref Range Status  11/06/2023 0.69 0.57 - 1.00 mg/dL Final   Creatinine, POC  Date Value Ref Range Status  09/24/2020 300 mg/dL Final         Passed - eGFR is 30 or above and within 360 days    GFR, Est African American  Date Value Ref Range Status  06/16/2016 84 >=60 mL/min Final   GFR calc Af Amer  Date Value Ref Range Status  12/25/2020 118 >59 mL/min/1.73 Final    Comment:    **In accordance with recommendations from the NKF-ASN Task force,**   [facility] is in the process of updating its eGFR calculation to the   2021 CKD-EPI creatinine equation that estimates kidney function   without a race variable.    GFR, Est Non African American  Date Value Ref Range Status  06/16/2016 73 >=60 mL/min Final   GFR, Estimated  Date Value Ref Range Status  09/19/2022 >60 >60 mL/min Final    Comment:    (NOTE) Calculated using the CKD-EPI Creatinine Equation (2021)    GFR  Date Value Ref Range Status  02/13/2015 107.47 >60.00 mL/min Final   eGFR  Date Value Ref Range Status  11/06/2023 99 >59 mL/min/1.73 Final         Passed - Completed PHQ-2 or PHQ-9 in the last 360 days      Passed - Last BP in normal range    BP Readings from Last 1 Encounters:  02/02/24 124/76         Passed - Valid encounter within last 6 months    Recent  Outpatient Visits           4 months ago Type 2 diabetes mellitus with morbid obesity (HCC)   [place] [facility] - A Dept Of [place]. [facility] Jonah Blue B, MD   9 months ago Type 2 diabetes mellitus with morbid obesity [facility])   [place] [facility] - A Dept Of [place]. [facility] Marcine Matar, MD   1 year ago Pap smear for cervical cancer screening   [place] [facility] - A Dept Of [place]. [facility] Marcine Matar, MD   1 year ago 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[facility])   [place] [facility] - A Dept Of [place]. [facility] Marcine Matar, MD   1 year ago 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[facility])   [place] [facility] - A Dept Of . [facility] Marcine Matar, MD       Future Appointments             In 2 weeks Laural Benes,  Binnie Rail, MD [place] [facility] [place] - A Dept Of [place] [facility]ide[place]. [facility]   In 1 month Nahser, [name], MD [facility] at [facility], [facility]

## 2024-03-14 ENCOUNTER — Ambulatory Visit: Payer: BC Managed Care – PPO | Admitting: "Endocrinology

## 2024-03-17 ENCOUNTER — Other Ambulatory Visit: Payer: Self-pay | Admitting: Internal Medicine

## 2024-03-17 DIAGNOSIS — E1169 Type 2 diabetes mellitus with other specified complication: Secondary | ICD-10-CM

## 2024-03-17 DIAGNOSIS — E1159 Type 2 diabetes mellitus with other circulatory complications: Secondary | ICD-10-CM

## 2024-03-18 ENCOUNTER — Other Ambulatory Visit: Payer: Self-pay | Admitting: "Endocrinology

## 2024-03-22 ENCOUNTER — Ambulatory Visit: Attending: Internal Medicine | Admitting: Internal Medicine

## 2024-03-22 DIAGNOSIS — I2510 Atherosclerotic heart disease of native coronary artery without angina pectoris: Secondary | ICD-10-CM

## 2024-03-22 DIAGNOSIS — E1159 Type 2 diabetes mellitus with other circulatory complications: Secondary | ICD-10-CM

## 2024-03-22 DIAGNOSIS — Z7984 Long term (current) use of oral hypoglycemic drugs: Secondary | ICD-10-CM

## 2024-03-22 DIAGNOSIS — I5022 Chronic systolic (congestive) heart failure: Secondary | ICD-10-CM

## 2024-03-22 DIAGNOSIS — E119 Type 2 diabetes mellitus without complications: Secondary | ICD-10-CM | POA: Diagnosis not present

## 2024-03-22 DIAGNOSIS — E1169 Type 2 diabetes mellitus with other specified complication: Secondary | ICD-10-CM | POA: Diagnosis not present

## 2024-03-22 DIAGNOSIS — Z7985 Long-term (current) use of injectable non-insulin antidiabetic drugs: Secondary | ICD-10-CM | POA: Diagnosis not present

## 2024-03-22 DIAGNOSIS — Z794 Long term (current) use of insulin: Secondary | ICD-10-CM | POA: Diagnosis not present

## 2024-03-22 DIAGNOSIS — I152 Hypertension secondary to endocrine disorders: Secondary | ICD-10-CM

## 2024-03-22 DIAGNOSIS — F32A Depression, unspecified: Secondary | ICD-10-CM

## 2024-03-22 LAB — POCT GLYCOSYLATED HEMOGLOBIN (HGB A1C): HbA1c, POC (controlled diabetic range): 8.4 % — AB (ref 0.0–7.0)

## 2024-03-22 LAB — GLUCOSE, POCT (MANUAL RESULT ENTRY): POC Glucose: 180 mg/dL — AB (ref 70–99)

## 2024-03-22 MED ORDER — INSULIN GLARGINE 100 UNIT/ML SOLOSTAR PEN: 84.0000 [IU] | PEN_INJECTOR | Freq: Every day | SUBCUTANEOUS | 3 refills | Status: DC

## 2024-03-22 MED ORDER — TIRZEPATIDE 15 MG/0.5ML ~~LOC~~ SOAJ
15.0000 mg | SUBCUTANEOUS | 1 refills | Status: DC
Start: 2024-03-22 — End: 2024-09-21

## 2024-03-22 NOTE — Progress Notes (Signed)
 Patient ID: Kelly Lang, female    DOB: [birth year]  MRN: [id number]  CC: Medical Management of Chronic Issues   Subjective: Kelly Lang is a 63 y.o. female who presents for chronic ds management. 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    Discussed the use of AI scribe software for clinical note transcription with the patient, who gave verbal consent to proceed.  History of Present Illness    For her depression, she is currently on Cymbalta  and reports no current issues with depression.   HTN/CHF/CAD/A.flutter:   In terms of her heart disease, she was recently switched from lisinopril  to Entresto  by her cardiologist. She has not noticed any chest pain, shortness of breath, or swelling in the legs. Confirms taking Entresto  24/26 mg BID, metoprolol  75 mg daily, isosorbide  30 mg daily, Plavix  75 mg daily, atorvastatin  80 mg daily and Xarelto  20 mg daily  No bleeding.  Limits salt in foods  DM: Results for orders placed or performed in visit on 03/22/24  POCT glucose (manual entry)   Collection Time: 03/22/24  4:09 PM  Result Value Ref Range   POC Glucose 180 (A) 70 - 99 mg/dl  POCT glycosylated hemoglobin (Hb A1C)   Collection Time: 03/22/24  4:14 PM  Result Value Ref Range   Hemoglobin A1C     HbA1c POC (<> result, manual entry)     HbA1c, POC (prediabetic range)     HbA1c, POC (controlled diabetic range) 8.4 (A) 0.0 - 7.0 %   presents with poorly controlled blood sugars. She reports that her A1c is 8.4 and her fasting blood sugar is 180. She has been on Mounjaro  12.5mg  for the past 5 months, but ever since she was taken off glipizide , she has been struggling to control her blood sugars. She is also on Lantus  80 units daily and metformin  twice  daily. She uses a Dexcom 7 continuous glucose monitor, which shows most of her blood sugars are between 200-300. She has not noticed any significant weight loss or appetite suppression with Mounjaro . Patient Active Problem List   Diagnosis Date Noted   Polyarthralgia 10/12/2023   Insulin  long-term use (HCC) 07/13/2023   PAF (paroxysmal atrial fibrillation) (HCC)    Class 2 obesity 06/25/2022   NSTEMI (non-ST elevated myocardial infarction) (HCC)    Thyroid  nodule 06/22/2022   Trigger ring finger of left hand 02/05/2021   Trigger little finger of right hand 02/05/2021   Mixed hyperlipidemia 09/17/2020   Multinodular goiter 09/16/2019   Chronic pain disorder 08/09/2018   Diabetic polyneuropathy associated with type 2 diabetes mellitus (HCC) 08/09/2018   GI bleed 12/06/2017   Hepatic steatosis 12/04/2017   Cerebrovascular accident (CVA) due to 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)    Low back pain 04/03/2014   Vitamin D  deficiency 01/20/2014   Essential hypertension, benign 01/20/2014   H/O: hypothyroidism    Class 2 severe obesity due to excess calories with serious comorbidity and body mass index (BMI) of 37.0 to 37.9 in adult [facility])    DM type 2 causing vascular disease (HCC)    Arthritis    Atrial fibrillation  10/25/2013     Current Outpatient Medications on File Prior to Visit  Medication Sig Dispense Refill   acetaminophen  (TYLENOL ) 500  MG tablet Take 1,000 mg by mouth every 6 (six) hours as needed for headache.     albuterol  (VENTOLIN  HFA) 108 (90 Base) MCG/ACT inhaler Inhale 1-2 puffs into the lungs every 6 (six) hours as needed for wheezing or shortness of breath. 18 g 0   atorvastatin  (LIPITOR ) 80 MG tablet Take 1 tablet by mouth once daily 90 tablet 3   clopidogrel  (PLAVIX ) 75 MG tablet Take 1 tablet by mouth once daily with breakfast 90 tablet 2   Continuous Blood Gluc Receiver (DEXCOM G7 RECEIVER) DEVI Use to monitor BG continuously 1 each 0   Continuous Glucose  Sensor (DEXCOM G7 SENSOR) MISC USE AS DIRECTED CHANGE  EVERY  10  DAYS 9 each 0   diclofenac  Sodium (VOLTAREN ) 1 % GEL Apply 2 g topically 4 (four) times daily. 100 g 2   DULoxetine  (CYMBALTA ) 60 MG capsule Take 1 capsule by mouth once daily 30 capsule 0   EQ LORATADINE  10 MG tablet Take 1 tablet by mouth once daily 90 tablet 0   isosorbide  mononitrate (IMDUR ) 30 MG 24 hr tablet Take 1 tablet by mouth once daily 90 tablet 3   metFORMIN  (GLUCOPHAGE ) 1000 MG tablet TAKE 1 TABLET BY MOUTH TWICE DAILY WITH A MEAL 60 tablet 0   metoprolol  succinate (TOPROL -XL) 25 MG 24 hr tablet Take 3 tablets by mouth once daily 270 tablet 3   ONETOUCH DELICA LANCETS 33G MISC Use as instructed to check blood sugar up to 3 times daily. 100 each 11   ONETOUCH VERIO test strip USE STRIPS TO CHECK GLUCOSE UP TO THREE TIMES DAILY AS DIRECTED 100 each 6   rivaroxaban  (XARELTO ) 20 MG TABS tablet TAKE 1 TABLET BY MOUTH ONCE DAILY WITH SUPPER 90 tablet 1   sacubitril -valsartan  (ENTRESTO ) 24-26 MG Take 1 tablet by mouth 2 (two) times daily. 60 tablet 11   SKYRIZI PEN 150 MG/ML SOAJ Inject 150 mg into the skin every 3 (three) months.     spironolactone  (ALDACTONE ) 25 MG tablet Take 1 tablet (25 mg total) by mouth daily. 90 tablet 3   TRUEPLUS PEN NEEDLES 32G X 4 MM MISC USE AS DIRECTED AT BEDTIME. 100 each 2   No current facility-administered medications on file prior to visit.    Allergies  Allergen Reactions   Jardiance  [Empagliflozin ] Other (See Comments)    Yeast infections   Codeine Itching and Rash    Social History   Socioeconomic History   Marital status: Divorced    Spouse name: Not on file   Number of children: 0   Years of education: 14   Highest education level: Some college, no degree  Occupational History   Occupation: Designer, television/film set  Tobacco Use   Smoking status: Former    Current packs/day: 0.00    Average packs/day: 1 pack/day for 22.0 years (22.0 ttl pk-yrs)    Types: Cigarettes    Start  date: 12/01/1988    Quit date: 12/01/2010    Years since quitting: 13.3   Smokeless tobacco: Never  Vaping Use   Vaping status: Never Used  Substance and Sexual Activity   Alcohol use: Yes    Comment: social   Drug use: No   Sexual activity: Not Currently  Other Topics Concern   Not on file  Social History Narrative   Moved to [place] from [place]  in 2002.     Fun: shop, sew, decorate   Denies any beliefs effecting healthcare   Social Drivers of Health  Financial Resource Strain: Medium Risk (03/22/2024)   Overall Financial Resource Strain (CARDIA)    Difficulty of Paying Living Expenses: Somewhat hard  Food Insecurity: Patient Declined (03/22/2024)   Hunger Vital Sign    Worried About Running Out of Food in the Last Year: Patient declined    Ran Out of Food in the Last Year: Patient declined  Transportation Needs: No Transportation Needs (03/22/2024)   PRAPARE - Administrator, Civil Service (Medical): No    Lack of Transportation (Non-Medical): No  Physical Activity: Insufficiently Active (10/12/2023)   Exercise Vital Sign    Days of Exercise per Week: 1 day    Minutes of Exercise per Session: 30 min  Stress: Stress Concern Present (10/12/2023)   [employer] of Occupational Health - Occupational Stress Questionnaire    Feeling of Stress : Rather much  Social Connections: Moderately Integrated (03/22/2024)   Social Connection and Isolation Panel [NHANES]    Frequency of Communication with Friends and Family: Three times a week    Frequency of Social Gatherings with Friends and Family: Once a week    Attends Religious Services: 1 to 4 times per year    Active Member of Clubs or Organizations: No    Attends Banker Meetings: 1 to 4 times per year    Marital Status: Divorced  Catering manager Violence: Not At Risk (09/19/2022)   Humiliation, Afraid, Rape, and Kick questionnaire    Fear of Current or Ex-Partner: No    Emotionally Abused: No     Physically Abused: No    Sexually Abused: No    Family History  Adopted: Yes  Problem Relation Age of Onset   Hypertension Mother    Hyperlipidemia Mother     Past Surgical History:  Procedure Laterality Date   ATRIAL FIBRILLATION ABLATION  06/30/2017   ATRIAL FIBRILLATION ABLATION N/A 06/30/2017   Procedure: Atrial Fibrillation Ablation;  Surgeon: Jolly Needle, MD;  Location: [facility];  Service: Cardiovascular;  Laterality: N/A;   BICEPT TENODESIS Right 05/24/2020   Procedure: BICEPS TENODESIS;  Surgeon: Jasmine Mesi, MD;  Location: [facility];  Service: Orthopedics;  Laterality: Right;   BIOPSY THYROID   1996   CARDIOVERSION N/A 12/07/2013   Procedure: CARDIOVERSION;  Surgeon: Acie Acosta, MD;  Location: [facility];  Service: Cardiovascular;  Laterality: N/A;   CARDIOVERSION N/A 03/10/2016   Procedure: CARDIOVERSION;  Surgeon: Lake Pilgrim, MD;  Location: [facility] ENDOSCOPY;  Service: Cardiovascular;  Laterality: N/A;   CORONARY ATHERECTOMY N/A 09/18/2022   Procedure: CORONARY ATHERECTOMY;  Surgeon: Arnoldo Lapping, MD;  Location: [facility] INVASIVE CV LAB;  Service: Cardiovascular;  Laterality: N/A;   CORONARY BALLOON ANGIOPLASTY N/A 06/24/2022   Procedure: CORONARY BALLOON ANGIOPLASTY;  Surgeon: Arty Binning, MD;  Location: [facility];  Service: Cardiovascular;  Laterality: N/A;   CORONARY STENT INTERVENTION N/A 09/18/2022   Procedure: CORONARY STENT INTERVENTION;  Surgeon: Arnoldo Lapping, MD;  Location: [facility] INVASIVE CV LAB;  Service: Cardiovascular;  Laterality: N/A;   ESOPHAGOGASTRODUODENOSCOPY N/A 12/07/2017   Procedure: ESOPHAGOGASTRODUODENOSCOPY (EGD);  Surgeon: Tobin Forts, MD;  Location[facility] ENDOSCOPY;  Service: Endoscopy;  Laterality: N/A;   EYE MUSCLE SURGERY Right ~ 1966   LEFT HEART CATH AND CORONARY ANGIOGRAPHY N/A 06/24/2022   Procedure: LEFT HEART CATH AND CORONARY ANGIOGRAPHY;  Surgeon: Arty Binning, MD;  Location: [facility];   Service: Cardiovascular;  Laterality: N/A;   SHOULDER ARTHROSCOPY WITH ROTATOR CUFF REPAIR Right  05/24/2020   Procedure: right shoulder arthroscopy, rotator cuff tear repair biceps tenodesis;  Surgeon: Jasmine Mesi, MD;  Location: [facility];  Service: Orthopedics;  Laterality: Right;   TEE WITHOUT CARDIOVERSION N/A 06/29/2017   Procedure: TRANSESOPHAGEAL ECHOCARDIOGRAM (TEE);  Surgeon: Elmyra Haggard, MD;  Location: [facility] ENDOSCOPY;  Service: Cardiovascular;  Laterality: N/A;   TONSILLECTOMY  1971    ROS: Review of Systems Negative except as stated above  PHYSICAL EXAM: BP 120/60   Pulse (!) 101   Wt 234 lb 3.2 oz (106.2 kg)   LMP 04/15/2015 (LMP Unknown) Comment: last in may  SpO2 100%   BMI 38.38 kg/m   Wt Readings from Last 3 Encounters:  03/22/24 234 lb 3.2 oz (106.2 kg)  02/02/24 231 lb 9.6 oz (105.1 kg)  11/22/23 230 lb (104.3 kg)    Physical Exam  General appearance - alert, well appearing, older caucasian female and in no distress Mental status - normal mood, behavior, speech, dress, motor activity, and thought processes Neck - supple, no significant adenopathy Chest - clear to auscultation, no wheezes, rales or rhonchi, symmetric air entry Heart - normal rate, regular rhythm, normal S1, S2, no murmurs, rubs, clicks or gallops Extremities - peripheral pulses normal, no pedal edema, no clubbing or cyanosis      Latest Ref Rng & Units 11/06/2023    8:38 AM 07/10/2023    8:42 AM 04/08/2023    8:16 AM  CMP  Glucose 70 - 99 mg/dL 562  130  865   BUN 8 - 27 mg/dL 12  12  10    Creatinine 0.57 - 1.00 mg/dL 7.84  6.96  2.95   Sodium 134 - 144 mmol/L 137  137  137   Potassium 3.5 - 5.2 mmol/L 4.6  4.4  4.7   Chloride 96 - 106 mmol/L 102  101  102   CO2 20 - 29 mmol/L 23  23  21    Calcium  8.7 - 10.3 mg/dL 9.1  9.2  8.9   Total Protein 6.0 - 8.5 g/dL  6.8    Total Bilirubin 0.0 - 1.2 mg/dL  0.4    Alkaline Phos 44 - 121 IU/L  75    AST 0 - 40 IU/L  12     ALT 0 - 32 IU/L  19     Lipid Panel     Component Value Date/Time   CHOL 125 07/10/2023 0842   TRIG 131 07/10/2023 0842   HDL 36 (L) 07/10/2023 0842   CHOLHDL 3.5 07/10/2023 0842   CHOLHDL 6.8 06/25/2022 0514   VLDL 61 (H) 06/25/2022 0514   LDLCALC 66 07/10/2023 0842   LDLDIRECT 114.0 02/13/2015 0750    CBC    Component Value Date/Time   WBC 8.1 10/12/2023 1626   WBC 6.0 09/19/2022 0159   RBC 4.29 10/12/2023 1626   RBC 3.78 (L) 09/19/2022 0159   HGB 14.0 10/12/2023 1626   HCT 41.8 10/12/2023 1626   PLT 294 10/12/2023 1626   MCV 97 10/12/2023 1626   MCH 32.6 10/12/2023 1626   MCH 31.7 09/19/2022 0159   MCHC 33.5 10/12/2023 1626   MCHC 33.1 09/19/2022 0159   RDW 12.1 10/12/2023 1626   LYMPHSABS 2.0 06/22/2022 1504   LYMPHSABS 1.7 11/25/2017 1017   MONOABS 0.7 06/22/2022 1504   EOSABS 0.3 06/22/2022 1504   EOSABS 0.3 11/25/2017 1017   BASOSABS 0.1 06/22/2022 1504   BASOSABS 0.1 11/25/2017 1017      03/22/2024  4:55 PM 10/12/2023    3:10 PM 06/11/2023    2:10 PM  Depression screen PHQ 2/9  Decreased Interest 0 3 1  Down, Depressed, Hopeless 0 3 1  PHQ - 2 Score 0 6 2  Altered sleeping 0 2 1  Tired, decreased energy 0 2 1  Change in appetite 0 2 1  Feeling bad or failure about yourself  0 2 1  Trouble concentrating 0 2 1  Moving slowly or fidgety/restless 0 1 1  Suicidal thoughts 0 0 0  PHQ-9 Score 0 17 8  Difficult doing work/chores Not difficult at all Somewhat difficult     ASSESSMENT AND PLAN: 1. Type 2 diabetes mellitus associated with morbid obesity (HCC) (Primary) A1c is not at goal.  Discussed and encouraged healthy eating habits. Increase Mounjaro  to 15 mg once a week.  Increase Lantus  insulin  to 84 units daily.  Keep appointment with endocrinology in June. - POCT glucose (manual entry) - POCT glycosylated hemoglobin (Hb A1C) - tirzepatide  (MOUNJARO ) 15 MG/0.5ML Pen; Inject 15 mg into the skin once a week.  Dispense: 6 mL; Refill: 1 - insulin   glargine (LANTUS ) 100 UNIT/ML Solostar Pen; Inject 84 Units into the skin at bedtime.  Dispense: 75 mL; Refill: 3  2. Insulin  long-term use (HCC) 3. Diabetes mellitus treated with oral medication (HCC) 4. Long-term (current) use of injectable non-insulin  antidiabetic drugs See #1 above.  5. Hypertension associated with diabetes (HCC) At goal. Continue metoprolol  75 mg daily, isosorbide  30 mg daily, Entresto   6. Coronary artery disease involving native coronary artery of native heart without angina pectoris Stable. metoprolol  75 mg daily, isosorbide  30 mg daily, Plavix  75 mg daily, atorvastatin  80 mg daily   7. Chronic systolic congestive heart failure (HCC) Stable and compensated.  Continue Entresto  and metoprolol   8.  Depressive disorder in remission Continue Cymbalta .    Patient was given the opportunity to ask questions.  Patient verbalized understanding of the plan and was able to repeat key elements of the plan.   This documentation was completed using Paediatric nurse.  Any transcriptional errors are unintentional.  Orders Placed This Encounter  Procedures   POCT glucose (manual entry)   POCT glycosylated hemoglobin (Hb A1C)     Requested Prescriptions   Signed Prescriptions Disp Refills   tirzepatide  (MOUNJARO ) 15 MG/0.5ML Pen 6 mL 1    Sig: Inject 15 mg into the skin once a week.   insulin  glargine (LANTUS ) 100 UNIT/ML Solostar Pen 75 mL 3    Sig: Inject 84 Units into the skin at bedtime.    Return in about 4 months (around 07/22/2024).  Concetta Dee, MD, FACP

## 2024-03-22 NOTE — Patient Instructions (Signed)
 Increase Mounjaro  to 15 units once a week.  Increase Lantus  insulin  to 84 units daily.

## 2024-03-23 ENCOUNTER — Other Ambulatory Visit: Payer: Self-pay

## 2024-04-13 ENCOUNTER — Other Ambulatory Visit: Payer: Self-pay | Admitting: Cardiovascular Disease

## 2024-04-18 ENCOUNTER — Other Ambulatory Visit: Payer: Self-pay | Admitting: Internal Medicine

## 2024-04-18 DIAGNOSIS — E1169 Type 2 diabetes mellitus with other specified complication: Secondary | ICD-10-CM

## 2024-05-03 ENCOUNTER — Ambulatory Visit: Admitting: Cardiovascular Disease

## 2024-05-07 ENCOUNTER — Other Ambulatory Visit: Payer: Self-pay | Admitting: Internal Medicine

## 2024-05-07 DIAGNOSIS — F321 Major depressive disorder, single episode, moderate: Secondary | ICD-10-CM

## 2024-05-17 ENCOUNTER — Other Ambulatory Visit: Payer: Self-pay | Admitting: Internal Medicine

## 2024-05-23 ENCOUNTER — Ambulatory Visit: Payer: Self-pay | Admitting: "Endocrinology

## 2024-06-07 ENCOUNTER — Ambulatory Visit: Payer: Self-pay | Admitting: "Endocrinology

## 2024-06-15 ENCOUNTER — Encounter: Payer: Self-pay | Admitting: "Endocrinology

## 2024-06-15 ENCOUNTER — Ambulatory Visit (INDEPENDENT_AMBULATORY_CARE_PROVIDER_SITE_OTHER): Payer: Self-pay | Admitting: "Endocrinology

## 2024-06-15 VITALS — BP 124/76 | HR 52 | Ht 65.0 in | Wt 235.4 lb

## 2024-06-15 DIAGNOSIS — E782 Mixed hyperlipidemia: Secondary | ICD-10-CM

## 2024-06-15 DIAGNOSIS — Z6837 Body mass index (BMI) 37.0-37.9, adult: Secondary | ICD-10-CM

## 2024-06-15 DIAGNOSIS — E66812 Obesity, class 2: Secondary | ICD-10-CM

## 2024-06-15 DIAGNOSIS — I10 Essential (primary) hypertension: Secondary | ICD-10-CM

## 2024-06-15 DIAGNOSIS — E1159 Type 2 diabetes mellitus with other circulatory complications: Secondary | ICD-10-CM

## 2024-06-15 DIAGNOSIS — Z794 Long term (current) use of insulin: Secondary | ICD-10-CM

## 2024-06-15 LAB — POCT GLYCOSYLATED HEMOGLOBIN (HGB A1C): HbA1c, POC (controlled diabetic range): 8.1 % — AB (ref 0.0–7.0)

## 2024-06-15 MED ORDER — TOUJEO MAX SOLOSTAR 300 UNIT/ML ~~LOC~~ SOPN: 80.0000 [IU] | PEN_INJECTOR | Freq: Every evening | SUBCUTANEOUS | 1 refills | Status: DC

## 2024-06-15 NOTE — Progress Notes (Signed)
 06/15/2024, 12:37 PM  Endocrinology follow-up note   Subjective:    Patient ID: Kelly Lang, female    DOB: Jun 05, 1961.  Kelly Lang is being seen in follow-up after she was seen in consultation for management of currently uncontrolled symptomatic diabetes requested by  Vicci Barnie NOVAK, MD.   Past Medical History:  Diagnosis Date   Arthritis    all over (06/30/2017)   Chicken pox    Dyslipidemia    Dysrhythmia ablation 2018   a-fib   GERD (gastroesophageal reflux disease)    H/O: hypothyroidism    a. thyroid  biopsy 1996 with synthroid. Stopped taking rx and was loss to follow up b. normal thyroid  function 10/20/13   High cholesterol    Hx of cardiovascular stress test    ETT/Lexiscan  Myoview (12/2013):  No ischemia; not gated; low risk.   Hypertension    Obesity    Persistent atrial fibrillation (HCC)    a diagnosed 10/25/2013   Psoriasis    on Skyrizi   Seasonal allergies    Stroke [facility]) 2016   some speech problems since (06/30/2017)   Tachycardia induced cardiomyopathy (HCC)    a. 2/2 Afib with RVR for unknown duration (EF: 40-45%)   Thyroid  disease    Type II diabetes mellitus (HCC)    a. hg A1c 10, newly diagnosed (10/26/13)    Past Surgical History:  Procedure Laterality Date   ATRIAL FIBRILLATION ABLATION  06/30/2017   ATRIAL FIBRILLATION ABLATION N/A 06/30/2017   Procedure: Atrial Fibrillation Ablation;  Surgeon: Kelsie Agent, MD;  Location: [facility];  Service: Cardiovascular;  Laterality: N/A;   BICEPT TENODESIS Right 05/24/2020   Procedure: BICEPS TENODESIS;  Surgeon: Addie Cordella Hamilton, MD;  Location: [facility];  Service: Orthopedics;  Laterality: Right;   BIOPSY THYROID   1996   CARDIOVERSION N/A 12/07/2013   Procedure: CARDIOVERSION;  Surgeon: Gordy Reek, MD;  Location: [facility] ENDOSCOPY;  Service: Cardiovascular;  Laterality: N/A;    CARDIOVERSION N/A 03/10/2016   Procedure: CARDIOVERSION;  Surgeon: Aleene JINNY Passe, MD;  Location: [facility] END[facility]COPY;  Service: Cardiovascular;  Laterality: N/A;   CORONARY ATHERECTOMY N/A 09/18/2022   Procedure: CORONARY ATHERECTOMY;  Surgeon: Wonda Sharper, MD;  Location: [facility];  Service: Cardiovascular;  Laterality: N/A;   CORONARY BALLOON ANGIOPLASTY N/A 06/24/2022   Procedure: CORONARY BALLOON ANGIOPLASTY;  Surgeon: Claudene Victory ORN, MD;  Location: [facility];  Service: Cardiovascular;  Laterality: N/A;   CORONARY STENT INTERVENTION N/A 09/18/2022   Procedure: CORONARY STENT INTERVENTION;  Surgeon: Wonda Sharper, MD;  Location: [facility];  Service: Cardiovascular;  Laterality: N/A;   ESOPHAGOGASTRODUODENOSCOPY N/A 12/07/2017   Procedure: ESOPHAGOGASTRODUODENOSCOPY (EGD);  Surgeon: Abran Norleen SAILOR, MD;  Location: [facility];  Service: Endoscopy;  Laterality: N/A;   EYE MUSCLE SURGERY Right ~ 1966   LEFT HEART CATH AND CORONARY ANGIOGRAPHY N/A 06/24/2022   Procedure: LEFT HEART CATH AND CORONARY ANGIOGRAPHY;  Surgeon: Claudene Victory ORN, MD;  Location: [facility];  Service: Cardiovascular;  Laterality: N/A;   SHOULDER ARTHROSCOPY WITH ROTATOR CUFF REPAIR Right 05/24/2020   Procedure: right  shoulder arthroscopy, rotator cuff tear repair biceps tenodesis;  Surgeon: Addie Cordella Hamilton, MD;  Location: [facility];  Service: Orthopedics;  Laterality: Right;   TEE WITHOUT CARDIOVERSION N/A 06/29/2017   Procedure: TRANSESOPHAGEAL ECHOCARDIOGRAM (TEE);  Surgeon: Okey Vina GAILS, MD;  Location: [facility] END[facility]COPY;  Service: Cardiovascular;  Laterality: N/A;   TONSILLECTOMY  1971    Social History   Socioeconomic History   Marital status: Divorced    Spouse name: Not on file   Number of children: 0   Years of education: 14   Highest education level: Some college, no degree  Occupational History   Occupation: Designer, television/film set  Tobacco Use   Smoking status: Former     Current packs/day: 0.00    Average packs/day: 1 pack/day for 22.0 years (22.0 ttl pk-yrs)    Types: Cigarettes    Start date: 12/01/1988    Quit date: 12/01/2010    Years since quitting: 13.5   Smokeless tobacco: Never  Vaping Use   Vaping status: Never Used  Substance and Sexual Activity   Alcohol use: Yes    Comment: social   Drug use: No   Sexual activity: Not Currently  Other Topics Concern   Not on file  Social History Narrative   Moved to [place] from [place]  in 2002.     Fun: shop, sew, decorate   Denies any beliefs effecting healthcare   Social Drivers of Health   Financial Resource Strain: Medium Risk (03/22/2024)   Overall Financial Resource Strain (CARDIA)    Difficulty of Paying Living Expenses: Somewhat hard  Food Insecurity: Patient Declined (03/22/2024)   Hunger Vital Sign    Worried About Running Out of Food in the Last Year: Patient declined    Ran Out of Food in the Last Year: Patient declined  Transportation Needs: No Transportation Needs (03/22/2024)   PRAPARE - Administrator, Civil Service (Medical): No    Lack of Transportation (Non-Medical): No  Physical Activity: Insufficiently Active (10/12/2023)   Exercise Vital Sign    Days of Exercise per Week: 1 day    Minutes of Exercise per Session: 30 min  Stress: Stress Concern Present (10/12/2023)   [employer] of Occupational Health - Occupational Stress Questionnaire    Feeling of Stress : Rather much  Social Connections: Moderately Integrated (03/22/2024)   Social Connection and Isolation Panel    Frequency of Communication with Friends and Family: Three times a week    Frequency of Social Gatherings with Friends and Family: Once a week    Attends Religious Services: 1 to 4 times per year    Active Member of [employer] or Organizations: No    Attends Engineer, structural: 1 to 4 times per year    Marital Status: Divorced    Family History  Adopted: Yes  Problem Relation  Age of Onset   Hypertension Mother    Hyperlipidemia Mother     Outpatient Encounter Medications as of 06/15/2024  Medication Sig   insulin  glargine, 2 Unit Dial, (TOUJEO  MAX SOLOSTAR) 300 UNIT/ML Solostar Pen Inject 80 Units into the skin at bedtime.   acetaminophen  (TYLENOL ) 500 MG tablet Take 1,000 mg by mouth every 6 (six) hours as needed for headache.   albuterol  (VENTOLIN  HFA) 108 (90 Base) MCG/ACT inhaler Inhale 1-2 puffs into the lungs every 6 (six) hours as needed for wheezing or shortness of breath.   atorvastatin  (LIPITOR ) 80 MG tablet Take 1 tablet by mouth once daily  clopidogrel  (PLAVIX ) 75 MG tablet Take 1 tablet by mouth once daily with breakfast   Continuous Blood Gluc Receiver (DEXCOM G7 RECEIVER) DEVI Use to monitor BG continuously   Continuous Glucose Sensor (DEXCOM G7 SENSOR) MISC USE AS DIRECTED CHANGE  EVERY  10  DAYS   diclofenac  Sodium (VOLTAREN ) 1 % GEL Apply 2 g topically 4 (four) times daily.   DULoxetine  (CYMBALTA ) 60 MG capsule Take 1 capsule by mouth once daily   EQ LORATADINE  10 MG tablet Take 1 tablet by mouth once daily   isosorbide  mononitrate (IMDUR ) 30 MG 24 hr tablet Take 1 tablet by mouth once daily   metFORMIN  (GLUCOPHAGE ) 1000 MG tablet TAKE 1 TABLET BY MOUTH TWICE DAILY WITH A MEAL   metoprolol  succinate (TOPROL -XL) 25 MG 24 hr tablet Take 3 tablets by mouth once daily   ONETOUCH DELICA LANCETS 33G MISC Use as instructed to check blood sugar up to 3 times daily.   ONETOUCH VERIO test strip USE STRIPS TO CHECK GLUCOSE UP TO THREE TIMES DAILY AS DIRECTED   rivaroxaban  (XARELTO ) 20 MG TABS tablet TAKE 1 TABLET BY MOUTH ONCE DAILY WITH SUPPER   sacubitril -valsartan  (ENTRESTO ) 24-26 MG Take 1 tablet by mouth 2 (two) times daily.   SKYRIZI PEN 150 MG/ML SOAJ Inject 150 mg into the skin every 3 (three) months.   spironolactone  (ALDACTONE ) 25 MG tablet Take 1 tablet (25 mg total) by mouth daily.   tirzepatide  (MOUNJARO ) 15 MG/0.5ML Pen Inject 15 mg into the  skin once a week.   TRUEPLUS PEN NEEDLES 32G X 4 MM MISC USE AS DIRECTED AT BEDTIME.   [DISCONTINUED] insulin  glargine (LANTUS ) 100 UNIT/ML Solostar Pen Inject 84 Units into the skin at bedtime.   No facility-administered encounter medications on file as of 06/15/2024.    ALLERGIES: Allergies  Allergen Reactions   Jardiance  [Empagliflozin ] Other (See Comments)    Yeast infections   Codeine Itching and Rash    VACCINATION STATUS: Immunization History  Administered Date(s) Administered   Influenza, Seasonal, Injecte, Preservative Fre 10/12/2023   Influenza,inj,Quad PF,6+ Mos 11/19/2016, 09/07/2017, 08/09/2018, 08/16/2020, 08/23/2021, 09/08/2022   Moderna Covid-19 Vaccine Bivalent Booster 45yrs & up 09/10/2022   Moderna Sars-Covid-2 Vaccination 01/04/2020, 02/01/2020, 10/12/2020, 06/09/2021   Pneumococcal Polysaccharide-23 06/16/2016   Tdap 06/16/2016   Zoster Recombinant(Shingrix) 07/14/2022, 10/13/2022    Diabetes She presents for her follow-up diabetic visit. She has type 2 diabetes mellitus. Onset time: She was diagnosed at approximate age of 50 years. Her disease course has been improving. There are no hypoglycemic associated symptoms. Pertinent negatives for hypoglycemia include no confusion, headaches, pallor or seizures. Pertinent negatives for diabetes include no chest pain, no fatigue, no polydipsia, no polyphagia and no polyuria. There are no hypoglycemic complications. Symptoms are improving. Diabetic complications include a CVA and heart disease. Risk factors for coronary artery disease include dyslipidemia, diabetes mellitus, obesity, hypertension, post-menopausal, sedentary lifestyle and tobacco exposure. Current diabetic treatment includes insulin  injections (She is currently on Semglee  80 units nightly, Ozempic  2 mg weekly.  Glipizide  5 mg daily.  Metformin  1000 mg p.o. twice daily.). Her weight is decreasing steadily. She is following a generally unhealthy diet. When asked  about meal planning, she reported none. She has had a previous visit with a dietitian. She participates in exercise intermittently. Her home blood glucose trend is decreasing steadily. Her breakfast blood glucose range is generally 140-180 mg/dl. Her lunch blood glucose range is generally 140-180 mg/dl. Her dinner blood glucose range is generally 140-180 mg/dl. Her  bedtime blood glucose range is generally 140-180 mg/dl. Her overall blood glucose range is 140-180 mg/dl. Kelly Lang presents with her Dexcom CGM device with her.  Her most recent 2 weeks average blood glucose is 168 mg/day.  Her overview shows 64% time in range, 31% in 1 hyperglycemia, 5% Libre to hyperglycemia.  She has no hypoglycemia.  Her point-of-care A1c is 8.1%, progressively improving.     ) An ACE inhibitor/angiotensin II receptor blocker is being taken. Eye exam is current.  Hypertension This is a chronic problem. The current episode started more than 1 year ago. The problem is controlled. Pertinent negatives include no chest pain, headaches, palpitations or shortness of breath. Risk factors for coronary artery disease include diabetes mellitus, dyslipidemia, obesity, sedentary lifestyle, smoking/tobacco exposure and post-menopausal state. Past treatments include ACE inhibitors and beta blockers. Hypertensive end-organ damage includes CVA.  Hyperlipidemia This is a chronic problem. The current episode started more than 1 year ago. The problem is uncontrolled. Exacerbating diseases include diabetes and obesity. Pertinent negatives include no chest pain, myalgias or shortness of breath. Risk factors for coronary artery disease include dyslipidemia, diabetes mellitus, hypertension, obesity, a sedentary lifestyle and post-menopausal.      Objective:       06/15/2024   11:19 AM 03/22/2024    4:44 PM 03/22/2024    4:03 PM  Vitals with BMI  Height 5' 5    Weight 235 lbs 6 oz  234 lbs 3 oz  BMI 39.17    Systolic 124 120 854  Diastolic  76 60 114  Pulse 52  101    BP 124/76   Pulse (!) 52   Ht 5' 5 (1.651 m)   Wt 235 lb 6.4 oz (106.8 kg)   LMP 04/15/2015 (LMP Unknown) Comment: last in may  BMI 39.17 kg/m   Wt Readings from Last 3 Encounters:  06/15/24 235 lb 6.4 oz (106.8 kg)  03/22/24 234 lb 3.2 oz (106.2 kg)  02/02/24 231 lb 9.6 oz (105.1 kg)     CMP     Component Value Date/Time   NA 137 11/06/2023 0838   K 4.6 11/06/2023 0838   CL 102 11/06/2023 0838   CO2 23 11/06/2023 0838   GLUCOSE 128 (H) 11/06/2023 0838   GLUCOSE 111 (H) 09/19/2022 0159   BUN 12 11/06/2023 0838   CREATININE 0.69 11/06/2023 0838   CREATININE 0.90 06/16/2016 1128   CALCIUM  9.1 11/06/2023 0838   PROT 6.8 07/10/2023 0842   ALBUMIN 4.0 07/10/2023 0842   AST 12 07/10/2023 0842   ALT 19 07/10/2023 0842   ALKPHOS 75 07/10/2023 0842   BILITOT 0.4 07/10/2023 0842   GFRNONAA >60 09/19/2022 0159   GFRNONAA 73 06/16/2016 1128   GFRAA 118 12/25/2020 0802   GFRAA 84 06/16/2016 1128     Diabetic Labs (most recent): Lab Results  Component Value Date   HGBA1C 8.1 (A) 06/15/2024   HGBA1C 8.4 (A) 03/22/2024   HGBA1C 8.3 (A) 10/12/2023   MICROALBUR 150 09/24/2020   MICROALBUR 6.2 12/26/2015     Lipid Panel ( most recent) Lipid Panel     Component Value Date/Time   CHOL 125 07/10/2023 0842   TRIG 131 07/10/2023 0842   HDL 36 (L) 07/10/2023 0842   CHOLHDL 3.5 07/10/2023 0842   CHOLHDL 6.8 06/25/2022 0514   VLDL 61 (H) 06/25/2022 0514   LDLCALC 66 07/10/2023 0842   LDLDIRECT 114.0 02/13/2015 0750   LABVLDL 23 07/10/2023 0842      Lab  Results  Component Value Date   TSH 0.597 12/25/2020   TSH 1.01 09/16/2019   TSH 0.718 12/26/2015   TSH 0.950 09/15/2015   TSH 0.95 11/13/2014   TSH 1.020 10/25/2013   FREET4 0.99 12/25/2020   FREET4 0.98 12/26/2015   FREET4 0.98 10/25/2013      Assessment & Plan:   1. DM type 2 causing vascular disease (HCC) - Kelly Lang has currently uncontrolled symptomatic type 2 DM since   63 years of age.  Kelly Lang presents with her Dexcom CGM device with her.  Her most recent 2 weeks average blood glucose is 168 mg/day.  Her overview shows 64% time in range, 31% in 1 hyperglycemia, 5% Libre to hyperglycemia.  She has no hypoglycemia.  Her point-of-care A1c is 8.1%, progressively improving.    - I had a long discussion with her about the progressive nature of diabetes and the pathology behind its complications. -her diabetes is complicated by CVA, coronary artery disease now status post stent placement, peripheral neuropathy and she remains at a high risk for more acute and chronic complications which include CAD, CVA, CKD, retinopathy, and neuropathy. These are all discussed in detail with her.  - I have counseled her on diet  and weight management  by adopting a carbohydrate restricted/protein rich diet. Patient is encouraged to switch to  unprocessed or minimally processed     complex starch and increased protein intake (animal or plant source), fruits, and vegetables. -  she is advised to stick to a routine mealtimes to eat 3 meals  a day and avoid unnecessary snacks ( to snack only to correct hypoglycemia).   - she acknowledges that there is a room for improvement in her food and drink choices. - Suggestion is made for her to avoid simple carbohydrates  from her diet including Cakes, Sweet Desserts, Ice Cream, Soda (diet and regular), Sweet Tea, Candies, Chips, Cookies, Store Bought Juices, Alcohol , Artificial Sweeteners,  Coffee Creamer, and Sugar-free Products, Lemonade. This will help patient to have more stable blood glucose profile and potentially avoid unintended weight gain.  The following Lifestyle Medicine recommendations according to American College of Lifestyle Medicine  Kelly Lang) were discussed and and offered to patient and she  agrees to start the journey:  A. Whole Foods, Plant-Based Nutrition comprising of fruits and vegetables, plant-based proteins, whole-grain  carbohydrates was discussed in detail with the patient.   A list for source of those nutrients were also provided to the patient.  Patient will use only water or unsweetened tea for hydration. B.  The need to stay away from risky substances including alcohol, smoking; obtaining 7 to 9 hours of restorative sleep, at least 150 minutes of moderate intensity exercise weekly, the importance of healthy social connections,  and stress management techniques were discussed. C.  A full color page of  Calorie density of various food groups per pound showing examples of each food groups was provided to the patient.   - she reports that she has had enough encounters with dietitian in the past.    - I have approached her with the following individualized plan to manage  her diabetes and patient agrees:   -She has not engaged with lifestyle medicine nutrition.   - Based on her presentation with near target glycemic profile, she will not need prandial insulin  for now.  I discussed and switched her basal insulin  to Toujeo  80 units nightly.  She is encouraged to continue to use her CGM  at all times.   - She has benefited from GLP-1 receptor agonist.  She is advised to continue Mounjaro  12 mg subcutaneously weekly.   - Side effects and precautions discussed with her. - She did not tolerate SGLT2 inhibitors.  - she is encouraged to call clinic for blood glucose levels less than 70 or above 200 mg /dl.   - Specific targets for  A1c;  LDL, HDL,  and Triglycerides were discussed with the patient.  2) Blood Pressure /Hypertension:  Her blood pressure is controlled to target.  She has a chance to minimize her blood pressure medications on subsequent visits.  The above recommended plant dominant whole food approach will address hypertension, hyperlipidemia as well.   she is advised to continue her current medications including lisinopril  10 mg p.o. daily, metoprolol  25 mg p.o. twice daily.   3)  Lipids/Hyperlipidemia:   Review of her recent lipid panel showed improved LDL at 66.  She is advised to continue atorvastatin  40 mg p.o. nightly.  Side effects and precautions discussed with her.     4)  Weight/Diet:  Body mass index is 39.17 kg/m.  -She has not achieved significant weight loss on GLP-1 AGONIST DESPITE MAXIMUM DOSE OF MOUNJARO  AND 50 MG.   Clearly complicating her diabetes care.    she is  a candidate for weight loss. -bariatric surgery would have been a good option for her, this option would be discussed during her next visit.   I discussed with her the fact that loss of 5 - 10% of her  current body weight will have the most impact on her diabetes management.  Exercise, and detailed carbohydrates information provided  -  detailed on discharge instructions.   5) Chronic Care/Health Maintenance:  -she  is on ACEI/ARB and Statin medications and  is encouraged to initiate and continue to follow up with Ophthalmology, Dentist,  Podiatrist at least yearly or according to recommendations, and advised to   stay away from smoking. I have recommended yearly flu vaccine and pneumonia vaccine at least every 5 years; moderate intensity exercise for up to 150 minutes weekly; and  sleep for at least 7 hours a day.  Her diabetes foot exam today is significant for partial  neuropathy.   - she is  advised to maintain close follow up with Vicci Barnie NOVAK, MD for primary care needs, as well as her other providers for optimal and coordinated care.   I spent  25  minutes in the care of the patient today including review of labs from CMP, Lipids, Thyroid  Function, Hematology (current and previous including abstractions from other facilities); face-to-face time discussing  her blood glucose readings/logs, discussing hypoglycemia and hyperglycemia episodes and symptoms, medications doses, her options of short and long term treatment based on the latest standards of care / guidelines;  discussion  about incorporating lifestyle medicine;  and documenting the encounter. Risk reduction counseling performed per USPSTF guidelines to reduce  obesity and cardiovascular risk factors.     Please refer to Patient Instructions for Blood Glucose Monitoring and Insulin /Medications Dosing Guide  in media tab for additional information. Please  also refer to  Patient Self Inventory in the Media  tab for reviewed elements of pertinent patient history.  Kelly Lang participated in the discussions, expressed understanding, and voiced agreement with the above plans.  All questions were answered to her satisfaction. she is encouraged to contact clinic should she have any questions or concerns prior to her return visit.  Follow up plan: - Return in about 3 months (around 09/15/2024) for F/U with Pre-visit Labs, Meter/CGM/Logs, A1c here.  Kelly Earl, MD [facility] [address] [place], [place] [address] Phone: [phone]  Fax: [phone]    06/15/2024, 12:37 PM  This note was partially dictated with voice recognition software. Similar sounding words can be transcribed inadequately or may not  be corrected upon review.

## 2024-06-15 NOTE — Patient Instructions (Signed)

## 2024-07-06 ENCOUNTER — Other Ambulatory Visit: Payer: Self-pay

## 2024-07-06 MED ORDER — ATORVASTATIN CALCIUM 80 MG PO TABS: 80.0000 mg | ORAL_TABLET | Freq: Every day | ORAL | 2 refills | Status: AC

## 2024-07-14 ENCOUNTER — Other Ambulatory Visit: Payer: Self-pay

## 2024-07-14 DIAGNOSIS — E1159 Type 2 diabetes mellitus with other circulatory complications: Secondary | ICD-10-CM

## 2024-07-14 MED ORDER — TOUJEO MAX SOLOSTAR 300 UNIT/ML ~~LOC~~ SOPN: 80.0000 [IU] | PEN_INJECTOR | Freq: Every evening | SUBCUTANEOUS | 1 refills | Status: AC

## 2024-07-21 ENCOUNTER — Telehealth: Payer: Self-pay | Admitting: Internal Medicine

## 2024-07-21 NOTE — Telephone Encounter (Addendum)
 Confirmed appt for 8/22

## 2024-07-22 ENCOUNTER — Ambulatory Visit: Attending: Internal Medicine | Admitting: Internal Medicine

## 2024-07-22 ENCOUNTER — Encounter: Payer: Self-pay | Admitting: Internal Medicine

## 2024-07-22 VITALS — BP 106/68 | HR 80 | Temp 97.5°F | Ht 65.0 in | Wt 228.0 lb

## 2024-07-22 DIAGNOSIS — Z79899 Other long term (current) drug therapy: Secondary | ICD-10-CM

## 2024-07-22 DIAGNOSIS — E1169 Type 2 diabetes mellitus with other specified complication: Secondary | ICD-10-CM | POA: Diagnosis not present

## 2024-07-22 DIAGNOSIS — M25511 Pain in right shoulder: Secondary | ICD-10-CM

## 2024-07-22 DIAGNOSIS — I2510 Atherosclerotic heart disease of native coronary artery without angina pectoris: Secondary | ICD-10-CM

## 2024-07-22 DIAGNOSIS — E1159 Type 2 diabetes mellitus with other circulatory complications: Secondary | ICD-10-CM | POA: Diagnosis not present

## 2024-07-22 DIAGNOSIS — E1142 Type 2 diabetes mellitus with diabetic polyneuropathy: Secondary | ICD-10-CM

## 2024-07-22 DIAGNOSIS — Z794 Long term (current) use of insulin: Secondary | ICD-10-CM

## 2024-07-22 DIAGNOSIS — I5022 Chronic systolic (congestive) heart failure: Secondary | ICD-10-CM

## 2024-07-22 DIAGNOSIS — F32A Depression, unspecified: Secondary | ICD-10-CM

## 2024-07-22 DIAGNOSIS — E119 Type 2 diabetes mellitus without complications: Secondary | ICD-10-CM

## 2024-07-22 DIAGNOSIS — I152 Hypertension secondary to endocrine disorders: Secondary | ICD-10-CM

## 2024-07-22 DIAGNOSIS — Z7984 Long term (current) use of oral hypoglycemic drugs: Secondary | ICD-10-CM

## 2024-07-22 DIAGNOSIS — Z7985 Long-term (current) use of injectable non-insulin antidiabetic drugs: Secondary | ICD-10-CM

## 2024-07-22 NOTE — Progress Notes (Signed)
 Patient ID: Kelly Lang, female    DOB: [birth year]  MRN: [id number]  CC: Hypertension (HTN f/u./R shoulder pain X2 mo /)   Subjective: Kelly Lang is a 63 y.o. female who presents for chronic ds management. 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    Discussed the use of AI scribe software for clinical note transcription with the patient, who gave verbal consent to proceed.  History of Present Illness   Kelly Lang is a 63 year old female with hypertension, diabetes, and atrial fibrillation who presents for follow-up of her chronic conditions.  DM: She was last seen in April and has since had her diabetes medications adjusted by her endocrinologist. Her hemoglobin A1c decreased from 8.4% to 8.1% last month. She is currently on Mounjaro  15 mg once a week, metformin  1000 mg twice a day, and Toujeo  80 units at bedtime. She has experienced a decrease in appetite and has lost about 8 pounds in the past month. She uses a continuous glucose monitor but did not have it on today as she needs to purchase new sensors. She reports decreased sensation in her feet and recently cut her RT toe without realizing it.   HTN/CAD/CHF/a.flutter: she is on Entresto  24/26 mg twice a day, metoprolol  75 mg, isosorbide  30 mg, clopidogrel , atorvastatin  80 mg, Xarelto  and spironolactone  25 mg daily. No chest pain or shortness of breath. She experienced an episode at the beach 3-4 weeks ago where she felt jittery and had an 'outer body experience' lasting 24 hours, followed by a few days of feeling off balance. It has since resolved  MDD: She is on Cymbalta  for depression, which she feels is in remission.   She reports issues with her right shoulder, which had arthroscopic surgery  in 2021. The pain started about two months ago, is intermittent, and worsens with certain movements or sleeping positions. She describes it as feeling like it's 'coming out of the socket' and causing severe pain.  HM: Due for repeat Pap smear.  She had her diabetic eye exam done in February of this year.  Due for flu and PCV 20.  We do not have either today but I told patient that she can get those at any outside pharmacy.     Patient Active Problem List   Diagnosis Date Noted   Polyarthralgia 10/12/2023   Insulin  long-term use (HCC) 07/13/2023   PAF (paroxysmal atrial fibrillation) (HCC)    Class 2 obesity 06/25/2022   NSTEMI (non-ST elevated myocardial infarction) (HCC)    Thyroid  nodule 06/22/2022   Trigger ring finger of left hand 02/05/2021   Trigger little finger of right hand 02/05/2021   Mixed hyperlipidemia 09/17/2020   Multinodular goiter 09/16/2019   Chronic pain disorder 08/09/2018   Diabetic polyneuropathy associated with type 2 diabetes mellitus (HCC) 08/09/2018   GI bleed 12/06/2017   Hepatic steatosis 12/04/2017   Cerebrovascular accident (CVA) due to 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)    Low back pain 04/03/2014   Vitamin D  deficiency 01/20/2014   Essential hypertension, benign 01/20/2014   H/O: hypothyroidism    Class 2 severe obesity due to excess calories with serious comorbidity and body mass index (BMI) of 37.0 to 37.9 in adult [facility])    DM type 2 causing  vascular disease (HCC)    Arthritis    Atrial fibrillation  10/25/2013     Current Outpatient Medications on File Prior to Visit  Medication Sig Dispense Refill   acetaminophen  (TYLENOL ) 500 MG tablet Take 1,000 mg by mouth every 6 (six) hours as needed for headache.     atorvastatin  (LIPITOR ) 80 MG tablet Take 1 tablet (80 mg total) by mouth daily. 90 tablet 2   clopidogrel  (PLAVIX ) 75 MG tablet Take 1 tablet by mouth once daily with breakfast 90 tablet 2   Continuous Blood Gluc Receiver (DEXCOM  G7 RECEIVER) DEVI Use to monitor BG continuously 1 each 0   Continuous Glucose Sensor (DEXCOM G7 SENSOR) MISC USE AS DIRECTED CHANGE  EVERY  10  DAYS 9 each 0   diclofenac  Sodium (VOLTAREN ) 1 % GEL Apply 2 g topically 4 (four) times daily. 100 g 2   DULoxetine  (CYMBALTA ) 60 MG capsule Take 1 capsule by mouth once daily 30 capsule 5   EQ LORATADINE  10 MG tablet Take 1 tablet by mouth once daily 90 tablet 0   insulin  glargine, 2 Unit Dial, (TOUJEO  MAX SOLOSTAR) 300 UNIT/ML Solostar Pen Inject 80 Units into the skin at bedtime. 24 mL 1   isosorbide  mononitrate (IMDUR ) 30 MG 24 hr tablet Take 1 tablet by mouth once daily 90 tablet 3   metFORMIN  (GLUCOPHAGE ) 1000 MG tablet TAKE 1 TABLET BY MOUTH TWICE DAILY WITH A MEAL 180 tablet 2   metoprolol  succinate (TOPROL -XL) 25 MG 24 hr tablet Take 3 tablets by mouth once daily 270 tablet 3   ONETOUCH DELICA LANCETS 33G MISC Use as instructed to check blood sugar up to 3 times daily. 100 each 11   ONETOUCH VERIO test strip USE STRIPS TO CHECK GLUCOSE UP TO THREE TIMES DAILY AS DIRECTED 100 each 6   rivaroxaban  (XARELTO ) 20 MG TABS tablet TAKE 1 TABLET BY MOUTH ONCE DAILY WITH SUPPER 90 tablet 1   sacubitril -valsartan  (ENTRESTO ) 24-26 MG Take 1 tablet by mouth 2 (two) times daily. 60 tablet 11   SKYRIZI PEN 150 MG/ML SOAJ Inject 150 mg into the skin every 3 (three) months.     spironolactone  (ALDACTONE ) 25 MG tablet Take 1 tablet (25 mg total) by mouth daily. 90 tablet 3   tirzepatide  (MOUNJARO ) 15 MG/0.5ML Pen Inject 15 mg into the skin once a week. 6 mL 1   TRUEPLUS PEN NEEDLES 32G X 4 MM MISC USE AS DIRECTED AT BEDTIME. 100 each 2   albuterol  (VENTOLIN  HFA) 108 (90 Base) MCG/ACT inhaler Inhale 1-2 puffs into the lungs every 6 (six) hours as needed for wheezing or shortness of breath. 18 g 0   No current facility-administered medications on file prior to visit.    Allergies  Allergen Reactions   Jardiance  [Empagliflozin ] Other (See Comments)    Yeast  infections   Codeine Itching and Rash    Social History   Socioeconomic History   Marital status: Divorced    Spouse name: Not on file   Number of children: 0   Years of education: 14   Highest education level: Some college, no degree  Occupational History   Occupation: Designer, television/film set  Tobacco Use   Smoking status: Former    Current packs/day: 0.00    Average packs/day: 1 pack/day for 22.0 years (22.0 ttl pk-yrs)    Types: Cigarettes    Start date: 12/01/1988    Quit date: 12/01/2010    Years since quitting: 13.6   Smokeless tobacco:  Never  Vaping Use   Vaping status: Never Used  Substance and Sexual Activity   Alcohol use: Yes    Comment: social   Drug use: No   Sexual activity: Not Currently  Other Topics Concern   Not on file  Social History Narrative   Moved to [place] from [place]  in 2002.     Fun: shop, sew, decorate   Denies any beliefs effecting healthcare   Social Drivers of Health   Financial Resource Strain: Low Risk  (07/21/2024)   Overall Financial Resource Strain (CARDIA)    Difficulty of Paying Living Expenses: Not very hard  Food Insecurity: No Food Insecurity (07/21/2024)   Hunger Vital Sign    Worried About Running Out of Food in the Last Year: Never true    Ran Out of Food in the Last Year: Never true  Transportation Needs: No Transportation Needs (07/21/2024)   PRAPARE - Administrator, Civil Service (Medical): No    Lack of Transportation (Non-Medical): No  Physical Activity: Insufficiently Active (07/21/2024)   Exercise Vital Sign    Days of Exercise per Week: 3 days    Minutes of Exercise per Session: 30 min  Stress: No Stress Concern Present (07/21/2024)   [employer] of Occupational Health - Occupational Stress Questionnaire    Feeling of Stress: Only a little  Social Connections: Moderately Integrated (07/21/2024)   Social Connection and Isolation Panel    Frequency of Communication with Friends and Family: More than  three times a week    Frequency of Social Gatherings with Friends and Family: Once a week    Attends Religious Services: 1 to 4 times per year    Active Member of [employer] or Organizations: Yes    Attends Banker Meetings: 1 to 4 times per year    Marital Status: Divorced  Intimate Partner Violence: Not At Risk (09/19/2022)   Humiliation, Afraid, Rape, and Kick questionnaire    Fear of Current or Ex-Partner: No    Emotionally Abused: No    Physically Abused: No    Sexually Abused: No    Family History  Adopted: Yes  Problem Relation Age of Onset   Hypertension Mother    Hyperlipidemia Mother     Past Surgical History:  Procedure Laterality Date   ATRIAL FIBRILLATION ABLATION  06/30/2017   ATRIAL FIBRILLATION ABLATION N/A 06/30/2017   Procedure: Atrial Fibrillation Ablation;  Surgeon: Kelsie Agent, MD;  Location: [facility];  Service: Cardiovascular;  Laterality: N/A;   BICEPT TENODESIS Right 05/24/2020   Procedure: BICEPS TENODESIS;  Surgeon: Addie Cordella Hamilton, MD;  Location: [facility];  Service: Orthopedics;  Laterality: Right;   BIOPSY THYROID   1996   CARDIOVERSION N/A 12/07/2013   Procedure: CARDIOVERSION;  Surgeon: Gordy Reek, MD;  Location: [facility] ENDOSCOPY;  Service: Cardiovascular;  Laterality: N/A;   CARDIOVERSION N/A 03/10/2016   Procedure: CARDIOVERSION;  Surgeon: Aleene JINNY Passe, MD;  Location: [facility] ENDOSCOPY;  Service: Cardiovascular;  Laterality: N/A;   CORONARY ATHERECTOMY N/A 09/18/2022   Procedure: CORONARY ATHERECTOMY;  Surgeon: Wonda Sharper, MD;  Location: [facility] INVASIVE CV LAB;  Service: Cardiovascular;  Laterality: N/A;   CORONARY BALLOON ANGIOPLASTY N/A 06/24/2022   Procedure: CORONARY BALLOON ANGIOPLASTY;  Surgeon: Claudene Victory ORN, MD;  Location: [facility];  Service: Cardiovascular;  Laterality: N/A;   CORONARY STENT INTERVENTION N/A 09/18/2022   Procedure: CORONARY STENT INTERVENTION;  Surgeon: Wonda Sharper, MD;   Location: [facility] INVASIVE CV  LAB;  Service: Cardiovascular;  Laterality: N/A;   ESOPHAGOGASTRODUODENOSCOPY N/A 12/07/2017   Procedure: ESOPHAGOGASTRODUODENOSCOPY (EGD);  Surgeon: Abran Norleen SAILOR, MD;  Location: [facility] ENDOSCOPY;  Service: Endoscopy;  Laterality: N/A;   EYE MUSCLE SURGERY Right ~ 1966   LEFT HEART CATH AND CORONARY ANGIOGRAPHY N/A 06/24/2022   Procedure: LEFT HEART CATH AND CORONARY ANGIOGRAPHY;  Surgeon: Claudene Victory ORN, MD;  Location: [facility];  Service: Cardiovascular;  Laterality: N/A;   SHOULDER ARTHROSCOPY WITH ROTATOR CUFF REPAIR Right 05/24/2020   Procedure: right shoulder arthroscopy, rotator cuff tear repair biceps tenodesis;  Surgeon: Addie Cordella Hamilton, MD;  Location: [facility];  Service: Orthopedics;  Laterality: Right;   TEE WITHOUT CARDIOVERSION N/A 06/29/2017   Procedure: TRANSESOPHAGEAL ECHOCARDIOGRAM (TEE);  Surgeon: Okey Vina GAILS, MD;  Location: [facility] ENDOSCOPY;  Service: Cardiovascular;  Laterality: N/A;   TONSILLECTOMY  1971    ROS: Review of Systems Negative except as stated above  PHYSICAL EXAM: BP 106/68 (BP Location: Left Arm, Patient Position: Sitting, Cuff Size: Large)   Pulse 80   Temp (!) 97.5 F (36.4 C) (Oral)   Ht 5' 5 (1.651 m)   Wt 228 lb (103.4 kg)   LMP 04/15/2015 (LMP Unknown) Comment: last in may  SpO2 99%   BMI 37.94 kg/m   Wt Readings from Last 3 Encounters:  07/22/24 228 lb (103.4 kg)  06/15/24 235 lb 6.4 oz (106.8 kg)  03/22/24 234 lb 3.2 oz (106.2 kg)    Physical Exam  General appearance - alert, well appearing, and in no distress Mental status - normal mood, behavior, speech, dress, motor activity, and thought processes Neck - supple, no significant adenopathy Chest - clear to auscultation, no wheezes, rales or rhonchi, symmetric air entry Heart - normal rate, regular rhythm, normal S1, S2, no murmurs, rubs, clicks or gallops Extremities - peripheral pulses normal, no pedal edema, no clubbing or  cyanosis Diabetic Foot Exam - Simple   Simple Foot Form Diabetic Foot exam was performed with the following findings: Yes 07/22/2024  4:37 PM  Visual Inspection See comments: Yes Sensation Testing See comments: Yes Pulse Check Posterior Tibialis and Dorsalis pulse intact bilaterally: Yes Comments Bony overgrowth on base of both big toes and 5th toes. Small laceration noted at tip of RT big toe. No signs of infection.  Decrease sensation on LEAP exam on both plantar surface.         Latest Ref Rng & Units 11/06/2023    8:38 AM 07/10/2023    8:42 AM 04/08/2023    8:16 AM  CMP  Glucose 70 - 99 mg/dL 871  866  760   BUN 8 - 27 mg/dL 12  12  10    Creatinine 0.57 - 1.00 mg/dL 9.30  9.28  9.23   Sodium 134 - 144 mmol/L 137  137  137   Potassium 3.5 - 5.2 mmol/L 4.6  4.4  4.7   Chloride 96 - 106 mmol/L 102  101  102   CO2 20 - 29 mmol/L 23  23  21    Calcium  8.7 - 10.3 mg/dL 9.1  9.2  8.9   Total Protein 6.0 - 8.5 g/dL  6.8    Total Bilirubin 0.0 - 1.2 mg/dL  0.4    Alkaline Phos 44 - 121 IU/L  75    AST 0 - 40 IU/L  12    ALT 0 - 32 IU/L  19     Lipid Panel  Component Value Date/Time   CHOL 125 07/10/2023 0842   TRIG 131 07/10/2023 0842   HDL 36 (L) 07/10/2023 0842   CHOLHDL 3.5 07/10/2023 0842   CHOLHDL 6.8 06/25/2022 0514   VLDL 61 (H) 06/25/2022 0514   LDLCALC 66 07/10/2023 0842   LDLDIRECT 114.0 02/13/2015 0750    CBC    Component Value Date/Time   WBC 8.1 10/12/2023 1626   WBC 6.0 09/19/2022 0159   RBC 4.29 10/12/2023 1626   RBC 3.78 (L) 09/19/2022 0159   HGB 14.0 10/12/2023 1626   HCT 41.8 10/12/2023 1626   PLT 294 10/12/2023 1626   MCV 97 10/12/2023 1626   MCH 32.6 10/12/2023 1626   MCH 31.7 09/19/2022 0159   MCHC 33.5 10/12/2023 1626   MCHC 33.1 09/19/2022 0159   RDW 12.1 10/12/2023 1626   LYMPHSABS 2.0 06/22/2022 1504   LYMPHSABS 1.7 11/25/2017 1017   MONOABS 0.7 06/22/2022 1504   EOSABS 0.3 06/22/2022 1504   EOSABS 0.3 11/25/2017 1017   BASOSABS  0.1 06/22/2022 1504   BASOSABS 0.1 11/25/2017 1017    ASSESSMENT AND PLAN: 1. Hypertension associated with diabetes (HCC) (Primary) At goal.  Continue metoprolol  75 mg daily, isosorbide  30 mg daily, Entresto  26/24 twice a day and spironolactone   2. Coronary artery disease involving native coronary artery of native heart without angina pectoris Stable. metoprolol  75 mg daily, isosorbide  30 mg daily, Plavix  75 mg daily, atorvastatin  80 mg daily   3. Type 2 diabetes mellitus associated with morbid obesity (HCC) Patient diabetes also being treated for HTN and CAD. Commended her on weight loss so far.  Encouraged her to try to eat healthy foods as much as she can.  Continue medications as prescribed by her endocrinologist that includes Toujeo  80 units daily, metformin  1 g twice a day Mounjaro  15 g once a week.  4. Insulin  long-term use (HCC) 5. Long-term (current) use of injectable non-insulin  antidiabetic drugs 6. Diabetes mellitus treated with oral medication (HCC) See #3 above.  7. Diabetic peripheral neuropathy (HCC) Diabetic footcare discussed.  Encouraged to wear closed shoes.  8. Depressive disorder in remission Stable on Cymbalta   9. Acute pain of right shoulder - AMB referral to orthopedics  10. Chronic systolic congestive heart failure (HCC) Stable and compensated.  Continue Entresto  24/26 mg twice a day, isosorbide  30 mg daily metoprolol  75 mg daily and spironolactone .    Patient was given the opportunity to ask questions.  Patient verbalized understanding of the plan and was able to repeat key elements of the plan.   This documentation was completed using Paediatric nurse.  Any transcriptional errors are unintentional.  Orders Placed This Encounter  Procedures   AMB referral to orthopedics     Requested Prescriptions    No prescriptions requested or ordered in this encounter    Return in about 4 months (around 11/21/2024).  Barnie Louder, MD, FACP

## 2024-07-22 NOTE — Patient Instructions (Signed)
  VISIT SUMMARY: Today, you came in for a follow-up visit to discuss your chronic conditions, including diabetes, hypertension, atrial fibrillation, and depression. We also addressed your recent right shoulder pain and a foot wound on your right great toe.  YOUR PLAN: -TYPE 2 DIABETES MELLITUS WITH PERIPHERAL NEUROPATHY OF FEET: Type 2 diabetes is a condition where your body does not use insulin  properly, leading to high blood sugar levels. Peripheral neuropathy is nerve damage that causes decreased sensation in your feet. Your A1c has improved from 8.4% to 8.1%, and you have lost 8 pounds. Continue taking Mounjaro  15 mg once weekly, metformin  1000 mg twice daily, and Toujeo  80 units at bedtime. Use your continuous glucose monitor regularly. Cover your foot wound with a Band-Aid and avoid open-toe shoes to prevent injury and infection.  -FOOT WOUND, RIGHT GREAT TOE: The wound on your right great toe is likely due to decreased sensation from neuropathy. Cover the wound with a Band-Aid to prevent infection and avoid open-toe shoes to prevent further injury.  -HYPERTENSION: Hypertension is high blood pressure, which can lead to serious health problems if not managed. Continue taking Entresto  24/26 mg twice daily, metoprolol  75 mg daily, isosorbide  30 mg daily, clopidogrel , atorvastatin  80 mg daily, and spironolactone  25 mg daily.  -ATRIAL FIBRILLATION AND ATRIAL FLUTTER: Atrial fibrillation and atrial flutter are irregular heartbeats that can lead to blood clots, stroke, and heart failure. Continue your current medication regimen as listed under hypertension.  -CORONARY ARTERY DISEASE: Coronary artery disease is a condition where the blood vessels supplying the heart are narrowed or blocked. Continue your current medication regimen as listed under hypertension.  -DEPRESSION IN REMISSION: Your depression is currently in remission, meaning you are not experiencing symptoms. Continue taking Cymbalta . If  Cymbalta  makes you tired, take it in the evening.  -RIGHT SHOULDER PAIN, STATUS POST ARTHROSCOPIC SURGERY: You are experiencing intermittent severe pain in your right shoulder, possibly due to dislocation or strain. You will be referred to Dr. Addie for further evaluation.  -GENERAL HEALTH MAINTENANCE: You are due for flu and pneumonia vaccines. Your diabetic eye exam is completed, and you need to reschedule your Pap smear. Get the flu vaccine in the fall and the pneumonia 20 vaccine at the same time. We will request records from [facility] for your diabetic eye exam and schedule your Pap smear for a future date.  INSTRUCTIONS: Please follow up with Dr. Addie for your right shoulder pain evaluation. Get your flu and pneumonia vaccines in the fall. Use your continuous glucose monitor regularly and avoid open-toe shoes to prevent foot injuries. Schedule your Pap smear for a future date.                      Contains text generated by [employer]bridge.                                 Contains text generated by [employer]bridge.

## 2024-08-14 ENCOUNTER — Other Ambulatory Visit: Payer: Self-pay | Admitting: Internal Medicine

## 2024-08-15 ENCOUNTER — Ambulatory Visit: Admitting: Orthopedic Surgery

## 2024-08-25 ENCOUNTER — Encounter: Payer: Self-pay | Admitting: Dermatology

## 2024-08-25 ENCOUNTER — Ambulatory Visit (INDEPENDENT_AMBULATORY_CARE_PROVIDER_SITE_OTHER): Admitting: Dermatology

## 2024-08-25 VITALS — BP 91/71

## 2024-08-25 DIAGNOSIS — Z79899 Other long term (current) drug therapy: Secondary | ICD-10-CM | POA: Diagnosis not present

## 2024-08-25 DIAGNOSIS — M25569 Pain in unspecified knee: Secondary | ICD-10-CM

## 2024-08-25 DIAGNOSIS — L409 Psoriasis, unspecified: Secondary | ICD-10-CM | POA: Diagnosis not present

## 2024-08-25 DIAGNOSIS — M25529 Pain in unspecified elbow: Secondary | ICD-10-CM

## 2024-08-25 MED ORDER — SKYRIZI PEN 150 MG/ML ~~LOC~~ SOAJ: 150.0000 mg | SUBCUTANEOUS | 4 refills | Status: DC

## 2024-08-25 NOTE — Patient Instructions (Addendum)
 Date: Thu Aug 25 2024  Hello Fifi,  Thank you for visiting today. Here is a summary of the key instructions:  - Medications:   - Continue using Skyrizi  for psoriasis   - Take your next Skyrizi  dose on October 1st   - Store the Skyrizi  sample in your refrigerator until October 1st  - Tests:   - Get a PPD or TB test done at any [facility] within the next couple of weeks  - Prescriptions:   - A new prescription for Skyrizi  will be sent to [facility] specialty pharmacy  - Follow-up:   - Annual follow-up appointment for psoriasis management   - Contact the office if you need any refills for topical medications for emergency flare-ups   - Reach out if you need an earlier appointment for any skin concerns  Please reach out if you have any questions or concerns.  Warm regards,  Dr. [facility]     Important Information  Due to recent changes in healthcare laws, you may see results of your pathology and/or laboratory studies on MyChart before the doctors have had a chance to review them. We understand that in some cases there may be results that are confusing or concerning to you. Please understand that not all results are received at the same time and often the doctors may need to interpret multiple results in order to provide you with the best plan of care or course of treatment. Therefore, we ask that you please give us  2 business days to thoroughly review all your results before contacting the office for clarification. Should we see a critical lab result, you will be contacted sooner.   If You Need Anything After Your Visit  If you have any questions or concerns for your doctor, please call our main line at [phone] If no one answers, please leave a voicemail as directed and we will return your call as soon as possible. Messages left after 4 pm will be answered the following business day.   You may also send us  a message via MyChart. We typically respond to MyChart  messages within 1-2 business days.  For prescription refills, please ask your pharmacy to contact our office. Our fax number is [phone].  If you have an urgent issue when the clinic is closed that cannot wait until the next business day, you can page your doctor at the number below.    Please note that while we do our best to be available for urgent issues outside of office hours, we are not available 24/7.   If you have an urgent issue and are unable to reach us , you may choose to seek medical care at your doctor's office, retail clinic, urgent care center, or emergency room.  If you have a medical emergency, please immediately call 911 or go to the emergency department. In the event of inclement weather, please call our main line at [phone] for an update on the status of any delays or closures.  Dermatology Medication Tips: Please keep the boxes that topical medications come in in order to help keep track of the instructions about where and how to use these. Pharmacies typically print the medication instructions only on the boxes and not directly on the medication tubes.   If your medication is too expensive, please contact our office at [phone] or send us  a message through MyChart.   We are unable to tell what your co-pay for medications will be in advance as this is different depending  on your insurance coverage. However, we may be able to find a substitute medication at lower cost or fill out paperwork to get insurance to cover a needed medication.   If a prior authorization is required to get your medication covered by your insurance company, please allow us  1-2 business days to complete this process.  Drug prices often vary depending on where the prescription is filled and some pharmacies may offer cheaper prices.  The website www.goodrx.com contains coupons for medications through different pharmacies. The prices here do not account for what the cost may be with help  from insurance (it may be cheaper with your insurance), but the website can give you the price if you did not use any insurance.  - You can print the associated coupon and take it with your prescription to the pharmacy.  - You may also stop by our office during regular business hours and pick up a GoodRx coupon card.  - If you need your prescription sent electronically to a different pharmacy, notify our office through [facility] or by phone at [phone]

## 2024-08-25 NOTE — Progress Notes (Signed)
   New Patient Visit   Subjective  Kelly Lang is a 63 y.o. female who presents for a NEW PATIENT appointment to be examined for the concerns as listed below.   PSO: Originally Dx in the 90s after flaring at the posterior ears & elbows. She was previously a patient [facility] who started her on Skirizi which is working well for her. However, they no longer accept her insurance. She is not flared today and her next dose will be in October. [facility] pharmacy will be shipping it out to her.    No Hx of Bx.  Unsure family Hx of skin cancer (adopted).   The following portions of the chart were reviewed this encounter and updated as appropriate: medications, allergies, medical history  Review of Systems:  No other skin or systemic complaints except as noted in HPI or Assessment and Plan.  Objective  Well appearing patient in no apparent distress; mood and affect are within normal limits.   A focused examination was performed of the following areas: elbows   Relevant exam findings are noted in the Assessment and Plan.    Assessment & Plan   PSORIASIS Exam: clear on exam today  stable  patient reports joint pain  - Assessment: Patient has been on Skyrizi  for years with excellent control of psoriasis symptoms. Previously experienced severe flares on elbows with cracking, bleeding, and pain, as well as scalp involvement. Currently reports being 100% clear and controlled. Successful management with Skyrizi , an interleukin-23 inhibitor. - Plan:    Provide Skyrizi  sample for next dose to be kept refrigerated until October 1st    Send prescription to [facility] specialty pharmacy for future Skyrizi  refills    Administer Skyrizi  every 3 months    Provide lab slip for PPD or TB test to be completed at [facility] within the next couple of weeks    Patient to notify if need arises for topical treatments for emergency flares  Reviewed risks of biologics including immunosuppression,  infections (i.e. TB reactivation), injection site reaction, and failure to improve condition. Goal is control of skin condition, not cure.  Some older biologics such as Humira and Enbrel may slightly increase risk of malignancy and may worsen congestive heart failure.  Taltz, Cosentyx, and Bimzelx may cause inflammatory bowel disease to flare or may increase incidence of yeast infections. Skyrizi , Tremfya, and Stelara may also slightly increase risk of infection. The use of biologics requires long term medication management, including periodic office visits, annual TB screening test and monitoring of blood work.    2. Joint Pain - Assessment: Patient reports occasional joint pain, primarily in knees and elbows, usually occurring at night. Could be related to psoriatic arthritis, a common comorbidity of psoriasis. 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 - Plan:    Monitor joint pain symptoms    Patient to report if joint pain worsens or becomes more frequent  Follow-up annually for psoriasis management.   No follow-ups on file.   Documentation: I have reviewed the above documentation for accuracy and completeness, and I agree with the above.  I, Shirron Maranda, CMA, am acting as scribe for [employer], DO.   Delon Lenis, DO

## 2024-09-19 ENCOUNTER — Ambulatory Visit: Payer: Self-pay | Admitting: "Endocrinology

## 2024-09-21 ENCOUNTER — Other Ambulatory Visit: Payer: Self-pay | Admitting: Internal Medicine

## 2024-09-21 DIAGNOSIS — E1169 Type 2 diabetes mellitus with other specified complication: Secondary | ICD-10-CM

## 2024-10-03 ENCOUNTER — Encounter: Payer: Self-pay | Admitting: Radiology

## 2024-10-07 ENCOUNTER — Other Ambulatory Visit: Payer: Self-pay | Admitting: "Endocrinology

## 2024-10-08 LAB — COMPREHENSIVE METABOLIC PANEL WITH GFR
ALT: 17 IU/L (ref 0–32)
AST: 11 IU/L (ref 0–40)
Albumin: 4.4 g/dL (ref 3.9–4.9)
Alkaline Phosphatase: 72 IU/L (ref 49–135)
BUN/Creatinine Ratio: 29 — ABNORMAL HIGH (ref 12–28)
BUN: 27 mg/dL (ref 8–27)
Bilirubin Total: 0.5 mg/dL (ref 0.0–1.2)
CO2: 19 mmol/L — ABNORMAL LOW (ref 20–29)
Calcium: 10.1 mg/dL (ref 8.7–10.3)
Chloride: 99 mmol/L (ref 96–106)
Creatinine, Ser: 0.93 mg/dL (ref 0.57–1.00)
Globulin, Total: 3.2 g/dL (ref 1.5–4.5)
Glucose: 226 mg/dL — ABNORMAL HIGH (ref 70–99)
Potassium: 4.9 mmol/L (ref 3.5–5.2)
Sodium: 136 mmol/L (ref 134–144)
Total Protein: 7.6 g/dL (ref 6.0–8.5)
eGFR: 70 mL/min/1.73 (ref >59)

## 2024-10-08 LAB — TSH: TSH: 0.808 u[IU]/mL (ref 0.450–4.500)

## 2024-10-08 LAB — LIPID PANEL
Chol/HDL Ratio: 3.8 ratio (ref 0.0–4.4)
Cholesterol, Total: 146 mg/dL (ref 100–199)
HDL: 38 mg/dL — ABNORMAL LOW (ref >39)
LDL Chol Calc (NIH): 83 mg/dL (ref 0–99)
Triglycerides: 140 mg/dL (ref 0–149)
VLDL Cholesterol Cal: 25 mg/dL (ref 5–40)

## 2024-10-08 LAB — T4, FREE: Free T4: 1 ng/dL (ref 0.82–1.77)

## 2024-10-08 LAB — FIB-4 W/REFLEX TO ELF
FIB-4 Index: 0.47 (ref 0.00–2.67)
Platelets: 348 x10E3/uL (ref 150–450)

## 2024-10-08 LAB — VITAMIN D 25 HYDROXY (VIT D DEFICIENCY, FRACTURES): Vit D, 25-Hydroxy: 27.2 ng/mL — ABNORMAL LOW (ref 30.0–100.0)

## 2024-10-12 ENCOUNTER — Encounter: Payer: Self-pay | Admitting: "Endocrinology

## 2024-10-12 ENCOUNTER — Ambulatory Visit (INDEPENDENT_AMBULATORY_CARE_PROVIDER_SITE_OTHER): Payer: Self-pay | Admitting: "Endocrinology

## 2024-10-12 VITALS — BP 102/58 | HR 64 | Ht 65.0 in | Wt 228.2 lb

## 2024-10-12 DIAGNOSIS — Z794 Long term (current) use of insulin: Secondary | ICD-10-CM

## 2024-10-12 DIAGNOSIS — E782 Mixed hyperlipidemia: Secondary | ICD-10-CM

## 2024-10-12 DIAGNOSIS — E1159 Type 2 diabetes mellitus with other circulatory complications: Secondary | ICD-10-CM

## 2024-10-12 DIAGNOSIS — E66812 Obesity, class 2: Secondary | ICD-10-CM

## 2024-10-12 DIAGNOSIS — Z6837 Body mass index (BMI) 37.0-37.9, adult: Secondary | ICD-10-CM

## 2024-10-12 DIAGNOSIS — I10 Essential (primary) hypertension: Secondary | ICD-10-CM | POA: Diagnosis not present

## 2024-10-12 LAB — POCT GLYCOSYLATED HEMOGLOBIN (HGB A1C): HbA1c, POC (controlled diabetic range): 7.9 % — AB (ref 0.0–7.0)

## 2024-10-12 MED ORDER — DEXCOM G7 SENSOR MISC: 1 refills | Status: AC

## 2024-10-12 MED ORDER — EMPAGLIFLOZIN 10 MG PO TABS
10.0000 mg | ORAL_TABLET | Freq: Every day | ORAL | 1 refills | Status: AC
Start: 2024-10-12 — End: ?

## 2024-10-12 NOTE — Patient Instructions (Signed)

## 2024-10-12 NOTE — Progress Notes (Signed)
 10/12/2024, 3:43 PM  Endocrinology follow-up note   Subjective:    Patient ID: Kelly Lang, female    DOB: Sep 23, 1961.  Alani Sabbagh is being seen in follow-up after she was seen in consultation for management of currently uncontrolled symptomatic diabetes requested by  Vicci Barnie NOVAK, MD.   Past Medical History:  Diagnosis Date   Allergy    Arthritis    all over (06/30/2017)   CHF (congestive heart failure) (HCC)    Chicken pox    Depression    Dyslipidemia    Dysrhythmia ablation 2018   a-fib   GERD (gastroesophageal reflux disease)    H/O: hypothyroidism    a. thyroid  biopsy 1996 with synthroid. Stopped taking rx and was loss to follow up b. normal thyroid  function 10/20/13   High cholesterol    Hx of cardiovascular stress test    ETT/Lexiscan  Myoview (12/2013):  No ischemia; not gated; low risk.   Hypertension    Myocardial infarction [facility])    Obesity    Persistent atrial fibrillation (HCC)    a diagnosed 10/25/2013   Psoriasis    on Skyrizi    Seasonal allergies    Stroke [facility]) 2016   some speech problems since (06/30/2017)   Tachycardia induced cardiomyopathy (HCC)    a. 2/2 Afib with RVR for unknown duration (EF: 40-45%)   Thyroid  disease    Type II diabetes mellitus (HCC)    a. hg A1c 10, newly diagnosed (10/26/13)    Past Surgical History:  Procedure Laterality Date   ATRIAL FIBRILLATION ABLATION  06/30/2017   ATRIAL FIBRILLATION ABLATION N/A 06/30/2017   Procedure: Atrial Fibrillation Ablation;  Surgeon: Kelsie Agent, MD;  Location: [facility] INVASIVE CV LAB;  Service: Cardiovascular;  Laterality: N/A;   BICEPT TENODESIS Right 05/24/2020   Procedure: BICEPS TENODESIS;  Surgeon: Addie Cordella Hamilton, MD;  Location: [facility];  Service: Orthopedics;  Laterality: Right;   BIOPSY THYROID   1996   CARDIOVERSION N/A 12/07/2013   Procedure: CARDIOVERSION;   Surgeon: Gordy Reek, MD;  Location: [facility] ENDOSCOPY;  Service: Cardiovascular;  Laterality: N/A;   CARDIOVERSION N/A 03/10/2016   Procedure: CARDIOVERSION;  Surgeon: Aleene JINNY Passe, MD;  Location: [facility] ENDOSCOPY;  Service: Cardiovascular;  Laterality: N/A;   CORONARY ATHERECTOMY N/A 09/18/2022   Procedure: CORONARY ATHERECTOMY;  Surgeon: Wonda Sharper, MD;  Location: [facility] INVASIVE CV LAB;  Service: Cardiovascular;  Laterality: N/A;   CORONARY BALLOON ANGIOPLASTY N/A 06/24/2022   Procedure: CORONARY BALLOON ANGIOPLASTY;  Surgeon: Claudene Victory ORN, MD;  Location: [facility] INVASIVE CV LAB;  Service: Cardiovascular;  Laterality: N/A;   CORONARY STENT INTERVENTION N/A 09/18/2022   Procedure: CORONARY STENT INTERVENTION;  Surgeon: Wonda Sharper, MD;  Location: [facility] INVASIVE CV LAB;  Service: Cardiovascular;  Laterality: N/A;   ESOPHAGOGASTRODUODENOSCOPY N/A 12/07/2017   Procedure: ESOPHAGOGASTRODUODENOSCOPY (EGD);  Surgeon: Abran Norleen SAILOR, MD;  Location: [facility] ENDOSCOPY;  Service: Endoscopy;  Laterality: N/A;   EYE MUSCLE SURGERY Right ~ 1966   LEFT HEART CATH AND CORONARY ANGIOGRAPHY N/A 06/24/2022   Procedure: LEFT HEART CATH AND CORONARY ANGIOGRAPHY;  Surgeon: Claudene Victory ORN, MD;  Location: [facility] INVASIVE  CV LAB;  Service: Cardiovascular;  Laterality: N/A;   SHOULDER ARTHROSCOPY WITH ROTATOR CUFF REPAIR Right 05/24/2020   Procedure: right shoulder arthroscopy, rotator cuff tear repair biceps tenodesis;  Surgeon: Addie Cordella Hamilton, MD;  Location: [facility];  Service: Orthopedics;  Laterality: Right;   TEE WITHOUT CARDIOVERSION N/A 06/29/2017   Procedure: TRANSESOPHAGEAL ECHOCARDIOGRAM (TEE);  Surgeon: Okey Vina GAILS, MD;  Location: [facility] ENDOSCOPY;  Service: Cardiovascular;  Laterality: N/A;   TONSILLECTOMY  1971    Social History   Socioeconomic History   Marital status: Divorced    Spouse name: Not on file   Number of children: 0   Years of education: 14   Highest education level: Some college, no  degree  Occupational History   Occupation: designer, television/film set  Tobacco Use   Smoking status: Former    Current packs/day: 0.00    Average packs/day: 1 pack/day for 22.0 years (22.0 ttl pk-yrs)    Types: Cigarettes    Start date: 12/01/1988    Quit date: 12/01/2010    Years since quitting: 13.8   Smokeless tobacco: Never  Vaping Use   Vaping status: Never Used  Substance and Sexual Activity   Alcohol use: Yes    Comment: social   Drug use: No   Sexual activity: Not Currently  Other Topics Concern   Not on file  Social History Narrative   Moved to [place] from [place]  in 2002.     Fun: shop, sew, decorate   Denies any beliefs effecting healthcare   Social Drivers of Health   Financial Resource Strain: Low Risk  (07/21/2024)   Overall Financial Resource Strain (CARDIA)    Difficulty of Paying Living Expenses: Not very hard  Food Insecurity: No Food Insecurity (07/21/2024)   Hunger Vital Sign    Worried About Running Out of Food in the Last Year: Never true    Ran Out of Food in the Last Year: Never true  Transportation Needs: No Transportation Needs (07/21/2024)   PRAPARE - Administrator, Civil Service (Medical): No    Lack of Transportation (Non-Medical): No  Physical Activity: Insufficiently Active (07/21/2024)   Exercise Vital Sign    Days of Exercise per Week: 3 days    Minutes of Exercise per Session: 30 min  Stress: No Stress Concern Present (07/21/2024)   [employer] of Occupational Health - Occupational Stress Questionnaire    Feeling of Stress: Only a little  Social Connections: Moderately Integrated (07/21/2024)   Social Connection and Isolation Panel    Frequency of Communication with Friends and Family: More than three times a week    Frequency of Social Gatherings with Friends and Family: Once a week    Attends Religious Services: 1 to 4 times per year    Active Member of [employer] or Organizations: Yes    Attends Banker Meetings: 1  to 4 times per year    Marital Status: Divorced    Family History  Adopted: Yes  Problem Relation Age of Onset   Hypertension Mother    Hyperlipidemia Mother     Outpatient Encounter Medications as of 10/12/2024  Medication Sig   empagliflozin  (JARDIANCE ) 10 MG TABS tablet Take 1 tablet (10 mg total) by mouth daily before breakfast.   acetaminophen  (TYLENOL ) 500 MG tablet Take 1,000 mg by mouth every 6 (six) hours as needed for headache.   atorvastatin  (LIPITOR ) 80 MG tablet Take 1 tablet (80 mg total) by mouth daily.  clopidogrel  (PLAVIX ) 75 MG tablet Take 1 tablet by mouth once daily with breakfast   Continuous Blood Gluc Receiver (DEXCOM G7 RECEIVER) DEVI Use to monitor BG continuously   Continuous Glucose Sensor (DEXCOM G7 SENSOR) MISC USE AS DIRECTED CHANGE  EVERY  10  DAYS   diclofenac  Sodium (VOLTAREN ) 1 % GEL Apply 2 g topically 4 (four) times daily.   DULoxetine  (CYMBALTA ) 60 MG capsule Take 1 capsule by mouth once daily   insulin  glargine, 2 Unit Dial, (TOUJEO  MAX SOLOSTAR) 300 UNIT/ML Solostar Pen Inject 80 Units into the skin at bedtime.   isosorbide  mononitrate (IMDUR ) 30 MG 24 hr tablet Take 1 tablet by mouth once daily   loratadine  (CLARITIN ) 10 MG tablet Take 1 tablet by mouth once daily   metFORMIN  (GLUCOPHAGE ) 1000 MG tablet TAKE 1 TABLET BY MOUTH TWICE DAILY WITH A MEAL   metoprolol  succinate (TOPROL -XL) 25 MG 24 hr tablet Take 3 tablets by mouth once daily   ONETOUCH DELICA LANCETS 33G MISC Use as instructed to check blood sugar up to 3 times daily.   ONETOUCH VERIO test strip USE STRIPS TO CHECK GLUCOSE UP TO THREE TIMES DAILY AS DIRECTED   rivaroxaban  (XARELTO ) 20 MG TABS tablet TAKE 1 TABLET BY MOUTH ONCE DAILY WITH SUPPER   sacubitril -valsartan  (ENTRESTO ) 24-26 MG Take 1 tablet by mouth 2 (two) times daily.   SKYRIZI  PEN 150 MG/ML pen Inject 1 mL (150 mg total) into the skin every 3 (three) months.   spironolactone  (ALDACTONE ) 25 MG tablet Take 1 tablet (25 mg  total) by mouth daily.   tirzepatide  (MOUNJARO ) 15 MG/0.5ML Pen INJECT 15 MG SUBCUTANEOUSLY  ONCE A WEEK   TRUEPLUS PEN NEEDLES 32G X 4 MM MISC USE AS DIRECTED AT BEDTIME.   [DISCONTINUED] Continuous Glucose Sensor (DEXCOM G7 SENSOR) MISC USE AS DIRECTED CHANGE  EVERY  10  DAYS   No facility-administered encounter medications on file as of 10/12/2024.    ALLERGIES: Allergies  Allergen Reactions   Jardiance  [Empagliflozin ] Other (See Comments)    Yeast infections   Codeine Itching and Rash    VACCINATION STATUS: Immunization History  Administered Date(s) Administered   Influenza, Seasonal, Injecte, Preservative Fre 10/12/2023   Influenza,inj,Quad PF,6+ Mos 11/19/2016, 09/07/2017, 08/09/2018, 08/16/2020, 08/23/2021, 09/08/2022   Moderna Covid-19 Vaccine Bivalent Booster 42yrs & up 09/10/2022   Moderna Sars-Covid-2 Vaccination 01/04/2020, 02/01/2020, 10/12/2020, 06/09/2021   Pneumococcal Polysaccharide-23 06/16/2016   Tdap 06/16/2016   Zoster Recombinant(Shingrix) 07/14/2022, 10/13/2022    Diabetes She presents for her follow-up diabetic visit. She has type 2 diabetes mellitus. Onset time: She was diagnosed at approximate age of 50 years. Her disease course has been improving. There are no hypoglycemic associated symptoms. Pertinent negatives for hypoglycemia include no confusion, headaches, pallor or seizures. Pertinent negatives for diabetes include no chest pain, no fatigue, no polydipsia, no polyphagia and no polyuria. There are no hypoglycemic complications. Symptoms are improving. Diabetic complications include a CVA and heart disease. Risk factors for coronary artery disease include dyslipidemia, diabetes mellitus, obesity, hypertension, post-menopausal, sedentary lifestyle and tobacco exposure. Current diabetic treatment includes insulin  injections (She is currently on Semglee  80 units nightly, Ozempic  2 mg weekly.  Glipizide  5 mg daily.  Metformin  1000 mg p.o. twice daily.). Her  weight is decreasing steadily. She is following a generally unhealthy diet. When asked about meal planning, she reported none. She has had a previous visit with a dietitian. She participates in exercise intermittently. Her home blood glucose trend is fluctuating minimally. Her breakfast  blood glucose range is generally 180-200 mg/dl. Her lunch blood glucose range is generally 180-200 mg/dl. Her dinner blood glucose range is generally 180-200 mg/dl. Her bedtime blood glucose range is generally 180-200 mg/dl. Her overall blood glucose range is 180-200 mg/dl. Karbowski presents with her Dexcom CGM device with her.  Her most recent 2 weeks average blood glucose is 182 mg/day.  Her overview shows 54% time in range, 38% in 1 hyperglycemia, 8% Level 2 hyperglycemia.  She has no hypoglycemia.  Her point-of-care A1c is 7.9% progressively improving.      ) An ACE inhibitor/angiotensin II receptor blocker is being taken. Eye exam is current.  Hypertension This is a chronic problem. The current episode started more than 1 year ago. The problem is controlled. Pertinent negatives include no chest pain, headaches, palpitations or shortness of breath. Risk factors for coronary artery disease include diabetes mellitus, dyslipidemia, obesity, sedentary lifestyle, smoking/tobacco exposure and post-menopausal state. Past treatments include ACE inhibitors and beta blockers. Hypertensive end-organ damage includes CVA.  Hyperlipidemia This is a chronic problem. The current episode started more than 1 year ago. The problem is uncontrolled. Exacerbating diseases include diabetes and obesity. Pertinent negatives include no chest pain, myalgias or shortness of breath. Risk factors for coronary artery disease include dyslipidemia, diabetes mellitus, hypertension, obesity, a sedentary lifestyle and post-menopausal.      Objective:       10/12/2024    2:12 PM 08/25/2024    3:14 PM 07/22/2024    3:46 PM  Vitals with BMI  Height 5'  5  5' 5  Weight 228 lbs 3 oz  228 lbs  BMI 37.97  37.94  Systolic 102 91 106  Diastolic 58 71 68  Pulse 64  80    BP (!) 102/58   Pulse 64   Ht 5' 5 (1.651 m)   Wt 228 lb 3.2 oz (103.5 kg)   LMP 04/15/2015 (LMP Unknown) Comment: last in may  BMI 37.97 kg/m   Wt Readings from Last 3 Encounters:  10/12/24 228 lb 3.2 oz (103.5 kg)  07/22/24 228 lb (103.4 kg)  06/15/24 235 lb 6.4 oz (106.8 kg)     CMP     Component Value Date/Time   NA 136 10/07/2024 0922   K 4.9 10/07/2024 0922   CL 99 10/07/2024 0922   CO2 19 (L) 10/07/2024 0922   GLUCOSE 226 (H) 10/07/2024 0922   GLUCOSE 111 (H) 09/19/2022 0159   BUN 27 10/07/2024 0922   CREATININE 0.93 10/07/2024 0922   CREATININE 0.90 06/16/2016 1128   CALCIUM  10.1 10/07/2024 0922   PROT 7.6 10/07/2024 0922   ALBUMIN 4.4 10/07/2024 0922   AST 11 10/07/2024 0922   ALT 17 10/07/2024 0922   ALKPHOS 72 10/07/2024 0922   BILITOT 0.5 10/07/2024 0922   GFRNONAA >60 09/19/2022 0159   GFRNONAA 73 06/16/2016 1128   GFRAA 118 12/25/2020 0802   GFRAA 84 06/16/2016 1128     Diabetic Labs (most recent): Lab Results  Component Value Date   HGBA1C 7.9 (A) 10/12/2024   HGBA1C 8.1 (A) 06/15/2024   HGBA1C 8.4 (A) 03/22/2024   MICROALBUR 150 09/24/2020   MICROALBUR 6.2 12/26/2015     Lipid Panel ( most recent) Lipid Panel     Component Value Date/Time   CHOL 146 10/07/2024 0922   TRIG 140 10/07/2024 0922   HDL 38 (L) 10/07/2024 0922   CHOLHDL 3.8 10/07/2024 0922   CHOLHDL 6.8 06/25/2022 0514   VLDL 61 (H)  06/25/2022 0514   LDLCALC 83 10/07/2024 0922   LDLDIRECT 114.0 02/13/2015 0750   LABVLDL 25 10/07/2024 0922      Lab Results  Component Value Date   TSH 0.808 10/07/2024   TSH 0.597 12/25/2020   TSH 1.01 09/16/2019   TSH 0.718 12/26/2015   TSH 0.950 09/15/2015   TSH 0.95 11/13/2014   TSH 1.020 10/25/2013   FREET4 1.00 10/07/2024   FREET4 0.99 12/25/2020   FREET4 0.98 12/26/2015   FREET4 0.98 10/25/2013       Assessment & Plan:   1. DM type 2 causing vascular disease (HCC) - Clodagh Odenthal has currently uncontrolled symptomatic type 2 DM since  63 years of age.  Kambrea presents with her Dexcom CGM device with her.  Her most recent 2 weeks average blood glucose is 182 mg/day with 23.4% coefficient of variation.  Her overview shows 54% time in range, 38% in 1 hyperglycemia, 8% Level 2 hyperglycemia.  She has no hypoglycemia.  Her point-of-care A1c is 7.9% progressively improving.    - I had a long discussion with her about the progressive nature of diabetes and the pathology behind its complications. -her diabetes is complicated by CVA, coronary artery disease now status post stent placement, peripheral neuropathy and she remains at a high risk for more acute and chronic complications which include CAD, CVA, CKD, retinopathy, and neuropathy. These are all discussed in detail with her.  - I have counseled her on diet  and weight management  by adopting a carbohydrate restricted/protein rich diet. Patient is encouraged to switch to  unprocessed or minimally processed     complex starch and increased protein intake (animal or plant source), fruits, and vegetables. -  she is advised to stick to a routine mealtimes to eat 3 meals  a day and avoid unnecessary snacks ( to snack only to correct hypoglycemia).  - she acknowledges that there is a room for improvement in her food and drink choices. - Suggestion is made for her to avoid simple carbohydrates  from her diet including Cakes, Sweet Desserts, Ice Cream, Soda (diet and regular), Sweet Tea, Candies, Chips, Cookies, Store Bought Juices, Alcohol , Artificial Sweeteners,  Coffee Creamer, and Sugar-free Products, Lemonade. This will help patient to have more stable blood glucose profile and potentially avoid unintended weight gain.   - she reports that she has had enough encounters with dietitian in the past.    - I have approached her with the following  individualized plan to manage  her diabetes and patient agrees:  - Based on her presentation with near target glycemic profile, she will not need prandial insulin  for now.  She is advised to continue Toujeo  80 units nightly.  She will be reconsidered for low-dose Jardiance  which caused mild urinary tract infection in the first round, may tolerate it now that she is close to target.    I discussed and added Jardiance  10 mg p.o. daily at breakfast.    She is advised to continue metformin  1000 mg p.o. twice daily Mounjaro  15 mg subcutaneously weekly.  - Side effects and precautions for Mounjaro  and Jardiance  discussed with her. - She did not tolerate SGLT2 inhibitors.  - she is encouraged to call clinic for blood glucose levels less than 70 or above 200 mg /dl.   - Specific targets for  A1c;  LDL, HDL,  and Triglycerides were discussed with the patient.  2) Blood Pressure /Hypertension:  Her blood pressure is controlled to target.  She has a chance to minimize her blood pressure medications on subsequent visits.  The above recommended plant dominant whole food approach will address hypertension, hyperlipidemia as well.   she is advised to continue her current medications including lisinopril  10 mg p.o. daily, metoprolol  25 mg p.o. twice daily.   3) Lipids/Hyperlipidemia:   Review of her recent lipid panel showed LDL stable at 83 mg per DL.  She is advised to continue atorvastatin  40 mg p.o. nightly.   Side effects and precautions discussed with her.     4)  Weight/Diet:  Body mass index is 37.97 kg/m.  -She has  achieved some close on maximum dose of Mounjaro .   She still has high BMI of 37.97 kg/m. Clearly complicating her diabetes care.    she is  a candidate for weight loss. -bariatric surgery would have been a good option for her, this option would be discussed during her next visit.   I discussed with her the fact that loss of 5 - 10% of her  current body weight will have the most  impact on her diabetes management.  Exercise, and detailed carbohydrates information provided  -  detailed on discharge instructions.   5) Chronic Care/Health Maintenance:  -she  is on ACEI/ARB and Statin medications and  is encouraged to initiate and continue to follow up with Ophthalmology, Dentist,  Podiatrist at least yearly or according to recommendations, and advised to   stay away from smoking. I have recommended yearly flu vaccine and pneumonia vaccine at least every 5 years; moderate intensity exercise for up to 150 minutes weekly; and  sleep for at least 7 hours a day.  Her diabetes foot exam was significant for partial neuropathy in July 2025.    - she is  advised to maintain close follow up with Vicci Barnie NOVAK, MD for primary care needs, as well as her other providers for optimal and coordinated care.   I spent  26  minutes in the care of the patient today including review of labs from CMP, Lipids, Thyroid  Function, Hematology (current and previous including abstractions from other facilities); face-to-face time discussing  her blood glucose readings/logs, discussing hypoglycemia and hyperglycemia episodes and symptoms, medications doses, her options of short and long term treatment based on the latest standards of care / guidelines;  discussion about incorporating lifestyle medicine;  and documenting the encounter. Risk reduction counseling performed per USPSTF guidelines to reduce  obesity and cardiovascular risk factors.     Please refer to Patient Instructions for Blood Glucose Monitoring and Insulin /Medications Dosing Guide  in media tab for additional information. Please  also refer to  Patient Self Inventory in the Media  tab for reviewed elements of pertinent patient history.  Kelly Lang participated in the discussions, expressed understanding, and voiced agreement with the above plans.  All questions were answered to her satisfaction. she is encouraged to contact  clinic should she have any questions or concerns prior to her return visit.   Follow up plan: - Return in about 4 months (around 02/09/2025) for Bring Meter/CGM Device/Logs- A1c in Office.  Ranny Earl, MD [facility] [address] [place], [place] [address] Phone: [phone]  Fax: [phone]    10/12/2024, 3:43 PM  This note was partially dictated with voice recognition software. Similar sounding words can be transcribed inadequately or may not  be corrected upon review.

## 2024-10-24 ENCOUNTER — Other Ambulatory Visit: Payer: Self-pay

## 2024-10-24 DIAGNOSIS — I4891 Unspecified atrial fibrillation: Secondary | ICD-10-CM

## 2024-10-24 MED ORDER — RIVAROXABAN 20 MG PO TABS: 20.0000 mg | ORAL_TABLET | Freq: Every day | ORAL | 1 refills | Status: AC

## 2024-10-24 NOTE — Telephone Encounter (Signed)
 Prescription refill request for Xarelto  received.  Indication: a fib Last office visit: 02/02/24 Weight: 228# Age: 63 Scr: 0.93 epic 10/07/24 CrCl: 104 ml/min

## 2024-10-30 ENCOUNTER — Other Ambulatory Visit: Payer: Self-pay | Admitting: Internal Medicine

## 2024-10-30 DIAGNOSIS — F321 Major depressive disorder, single episode, moderate: Secondary | ICD-10-CM

## 2024-11-12 ENCOUNTER — Other Ambulatory Visit: Payer: Self-pay | Admitting: Internal Medicine

## 2024-11-16 ENCOUNTER — Telehealth: Payer: Self-pay

## 2024-11-16 DIAGNOSIS — L409 Psoriasis, unspecified: Secondary | ICD-10-CM

## 2024-11-16 DIAGNOSIS — Z79899 Other long term (current) drug therapy: Secondary | ICD-10-CM

## 2024-11-16 NOTE — Telephone Encounter (Signed)
 Pt called stating that she previously tried to have TB screening done that was ordered on 9/25 but when she presented to [facility] someone had deleted her order out of their system. She is requesting a new order be sent as she is due to take her next injection of Skyrizi  in January.   New TB screening lab ordered.

## 2024-11-19 LAB — QUANTIFERON-TB GOLD PLUS
QuantiFERON Mitogen Value: >10 [IU]/mL
QuantiFERON Nil Value: 0.07 [IU]/mL
QuantiFERON TB1 Ag Value: 0.11 [IU]/mL
QuantiFERON TB2 Ag Value: 0.07 [IU]/mL
QuantiFERON-TB Gold Plus: NEGATIVE

## 2024-11-20 ENCOUNTER — Ambulatory Visit: Payer: Self-pay | Admitting: Dermatology

## 2024-11-20 NOTE — Progress Notes (Signed)
 Pt's labs are WNL.  OK to send pend rx for skyrizi  continuation.

## 2024-11-21 MED ORDER — SKYRIZI PEN 150 MG/ML ~~LOC~~ SOAJ: 150.0000 mg | SUBCUTANEOUS | 4 refills | Status: AC

## 2024-11-21 NOTE — Addendum Note (Signed)
 Addended by: Hercules Hasler U on: 11/21/2024 12:51 PM   Modules accepted: Orders

## 2024-11-21 NOTE — Telephone Encounter (Signed)
 Pt has been informed and requested Rx be sent to [facility].   Rx sent.

## 2024-11-25 ENCOUNTER — Ambulatory Visit: Admission: EM | Admit: 2024-11-25 | Discharge: 2024-11-25 | Discharge status: Against Medical Advice

## 2024-11-25 ENCOUNTER — Ambulatory Visit
Admission: EM | Admit: 2024-11-25 | Discharge: 2024-11-25 | Disposition: A | Discharge status: Home / Self Care | Attending: Family Medicine | Admitting: Family Medicine

## 2024-11-25 ENCOUNTER — Other Ambulatory Visit: Payer: Self-pay

## 2024-11-25 DIAGNOSIS — R059 Cough, unspecified: Secondary | ICD-10-CM | POA: Diagnosis not present

## 2024-11-25 DIAGNOSIS — J101 Influenza due to other identified influenza virus with other respiratory manifestations: Secondary | ICD-10-CM | POA: Diagnosis not present

## 2024-11-25 DIAGNOSIS — U071 COVID-19: Secondary | ICD-10-CM

## 2024-11-25 LAB — POCT INFLUENZA A/B
Influenza A, POC: NEGATIVE
Influenza B, POC: POSITIVE — AB

## 2024-11-25 LAB — POC SOFIA SARS ANTIGEN FIA: SARS Coronavirus 2 Ag: POSITIVE — AB

## 2024-11-25 MED ORDER — PAXLOVID (300/100) 20 X 150 MG & 10 X 100MG PO TBPK: 3.0000 | ORAL_TABLET | Freq: Two times a day (BID) | ORAL | 0 refills | Status: AC

## 2024-11-25 MED ORDER — PREDNISONE 20 MG PO TABS: ORAL_TABLET | ORAL | 0 refills | Status: AC

## 2024-11-25 MED ORDER — OSELTAMIVIR PHOSPHATE 75 MG PO CAPS: 75.0000 mg | ORAL_CAPSULE | Freq: Two times a day (BID) | ORAL | 0 refills | Status: AC

## 2024-11-25 NOTE — ED Triage Notes (Addendum)
 Cough, nasal congestion, rhinorrhea for 4 days. Reports she was told she was wheezing in her sleep last night. Burned the top of her mouth 2 days ago. Reports she can't eat foods that aren't soft. Denies having fever. Has had mucinex  liquid then tablets.

## 2024-11-25 NOTE — ED Provider Notes (Signed)
 " [facility]    CSN: [id number] Arrival date & time: 11/25/24  1240      History   Chief Complaint Chief Complaint  Patient presents with   Cough   Burn   Nasal Congestion    HPI Kelly Lang is a 63 y.o. female.   HPI 63 year old female presents with cough, nasal congestion, rhinorrhea for 4 days.  Additionally, patient reports accidentally burned the roof of her mouth 2 days ago.  PMH significant for morbid obesity, CHF, stroke, and persistent A-fib.  Patient is currently on rivaroxaban  and Plavix  and denies any unusual bleeding.  Patient reports people at's work have been sick with influenza-like illness.  Past Medical History:  Diagnosis Date   Allergy    Arthritis    all over (06/30/2017)   CHF (congestive heart failure) (HCC)    Chicken pox    Depression    Dyslipidemia    Dysrhythmia ablation 2018   a-fib   GERD (gastroesophageal reflux disease)    H/O: hypothyroidism    a. thyroid  biopsy 1996 with synthroid. Stopped taking rx and was loss to follow up b. normal thyroid  function 10/20/13   High cholesterol    Hx of cardiovascular stress test    ETT/Lexiscan  Myoview (12/2013):  No ischemia; not gated; low risk.   Hypertension    Myocardial infarction [facility])    Obesity    Persistent atrial fibrillation (HCC)    a diagnosed 10/25/2013   Psoriasis    on Skyrizi    Seasonal allergies    Stroke [facility]) 2016   some speech problems since (06/30/2017)   Tachycardia induced cardiomyopathy (HCC)    a. 2/2 Afib with RVR for unknown duration (EF: 40-45%)   Thyroid  disease    Type II diabetes mellitus (HCC)    a. hg A1c 10, newly diagnosed (10/26/13)    Patient Active Problem List   Diagnosis Date Noted   Polyarthralgia 10/12/2023   Insulin  long-term use (HCC) 07/13/2023   PAF (paroxysmal atrial fibrillation) (HCC)    Class 2 obesity 06/25/2022   NSTEMI (non-ST elevated myocardial infarction) (HCC)    Thyroid  nodule 06/22/2022   Trigger ring finger  of left hand 02/05/2021   Trigger little finger of right hand 02/05/2021   Mixed hyperlipidemia 09/17/2020   Multinodular goiter 09/16/2019   Chronic pain disorder 08/09/2018   Diabetic polyneuropathy associated with type 2 diabetes mellitus (HCC) 08/09/2018   GI bleed 12/06/2017   Hepatic steatosis 12/04/2017   Cerebrovascular accident (CVA) due to 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)    Low back pain 04/03/2014   Vitamin D  deficiency 01/20/2014   Essential hypertension, benign 01/20/2014   H/O: hypothyroidism    Class 2 severe obesity due to excess calories with serious comorbidity and body mass index (BMI) of 37.0 to 37.9 in adult    DM type 2 causing vascular disease (HCC)    Arthritis    Atrial fibrillation  10/25/2013    Past Surgical History:  Procedure Laterality Date   ATRIAL FIBRILLATION ABLATION  06/30/2017   ATRIAL FIBRILLATION ABLATION N/A 06/30/2017   Procedure: Atrial Fibrillation Ablation;  Surgeon: Kelsie Agent, MD;  Location: [facility];  Service: Cardiovascular;  Laterality: N/A;   BICEPT TENODESIS Right 05/24/2020   Procedure: BICEPS TENODESIS;  Surgeon: Addie Cordella Hamilton, MD;  Location: [facility];  Service: Orthopedics;  Laterality: Right;   BIOPSY THYROID   1996   CARDIOVERSION N/A 12/07/2013   Procedure: CARDIOVERSION;  Surgeon: Gordy Reek, MD;  Location: [facility] ENDOSCOPY;  Service: Cardiovascular;  Laterality: N/A;   CARDIOVERSION N/A 03/10/2016   Procedure: CARDIOVERSION;  Surgeon: Aleene JINNY Passe, MD;  Location: [facility] ENDOSCOPY;  Service: Cardiovascular;  Laterality: N/A;   CORONARY ATHERECTOMY N/A 09/18/2022   Procedure: CORONARY ATHERECTOMY;  Surgeon: Wonda Sharper, MD;  Location: [facility] INVASIVE CV LAB;  Service: Cardiovascular;  Laterality: N/A;   CORONARY BALLOON ANGIOPLASTY N/A 06/24/2022   Procedure: CORONARY BALLOON ANGIOPLASTY;  Surgeon: Claudene Victory ORN, MD;  Location: [facility];  Service: Cardiovascular;  Laterality:  N/A;   CORONARY STENT INTERVENTION N/A 09/18/2022   Procedure: CORONARY STENT INTERVENTION;  Surgeon: Wonda Sharper, MD;  Location: [facility] INVASIVE CV LAB;  Service: Cardiovascular;  Laterality: N/A;   ESOPHAGOGASTRODUODENOSCOPY N/A 12/07/2017   Procedure: ESOPHAGOGASTRODUODENOSCOPY (EGD);  Surgeon: Abran Norleen SAILOR, MD;  Location: [facility] ENDOSCOPY;  Service: Endoscopy;  Laterality: N/A;   EYE MUSCLE SURGERY Right ~ 1966   LEFT HEART CATH AND CORONARY ANGIOGRAPHY N/A 06/24/2022   Procedure: LEFT HEART CATH AND CORONARY ANGIOGRAPHY;  Surgeon: Claudene Victory ORN, MD;  Location: [facility];  Service: Cardiovascular;  Laterality: N/A;   SHOULDER ARTHROSCOPY WITH ROTATOR CUFF REPAIR Right 05/24/2020   Procedure: right shoulder arthroscopy, rotator cuff tear repair biceps tenodesis;  Surgeon: Addie Cordella Hamilton, MD;  Location: [facility];  Service: Orthopedics;  Laterality: Right;   TEE WITHOUT CARDIOVERSION N/A 06/29/2017   Procedure: TRANSESOPHAGEAL ECHOCARDIOGRAM (TEE);  Surgeon: Okey Vina GAILS, MD;  Location: [facility] ENDOSCOPY;  Service: Cardiovascular;  Laterality: N/A;   TONSILLECTOMY  1971    OB History     Gravida  0   Para  0   Term  0   Preterm  0   AB  0   Living  0      SAB  0   IAB  0   Ectopic  0   Multiple  0   Live Births  0            Home Medications    Prior to Admission medications  Medication Sig Start Date End Date Taking? Authorizing Provider  nirmatrelvir/ritonavir (PAXLOVID , 300/100,) 20 x 150 MG & 10 x 100MG  TBPK Take 3 tablets by mouth 2 (two) times daily for 5 days. Patient GFR is 70. Take nirmatrelvir (150 mg) two tablets twice daily for 5 days and ritonavir (100 mg) one tablet twice daily for 5 days. 11/25/24 11/30/24 Yes Teddy Sharper, FNP  oseltamivir  (TAMIFLU ) 75 MG capsule Take 1 capsule (75 mg total) by mouth every 12 (twelve) hours. 11/25/24  Yes Teddy Sharper, FNP  predniSONE  (DELTASONE ) 20 MG tablet Take 3 tabs PO daily x 5 days.  11/25/24  Yes Teddy Sharper, FNP  acetaminophen  (TYLENOL ) 500 MG tablet Take 1,000 mg by mouth every 6 (six) hours as needed for headache.    [provider]  atorvastatin  (LIPITOR ) 80 MG tablet Take 1 tablet (80 mg total) by mouth daily. 07/06/24   Swinyer, Rosaline HERO, NP  clopidogrel  (PLAVIX ) 75 MG tablet Take 1 tablet by mouth once daily with breakfast 04/13/24   Nahser, Aleene JINNY, MD  Continuous Blood Gluc Receiver (DEXCOM G7 RECEIVER) DEVI Use to monitor BG continuously 03/12/22   Nida, Ethelle ORN, MD  Continuous Glucose Sensor (DEXCOM G7 SENSOR) MISC USE AS DIRECTED CHANGE  EVERY  10  DAYS 10/12/24   Nida, Ethelle ORN, MD  diclofenac  Sodium (VOLTAREN ) 1 % GEL Apply 2 g topically 4 (four)  times daily. 09/08/22   Vicci Barnie NOVAK, MD  DULoxetine  (CYMBALTA ) 60 MG capsule Take 1 capsule by mouth once daily 10/30/24   Vicci Barnie NOVAK, MD  empagliflozin  (JARDIANCE ) 10 MG TABS tablet Take 1 tablet (10 mg total) by mouth daily before breakfast. 10/12/24   Nida, Gebreselassie W, MD  insulin  glargine, 2 Unit Dial, (TOUJEO  MAX SOLOSTAR) 300 UNIT/ML Solostar Pen Inject 80 Units into the skin at bedtime. 07/14/24   Nida, Gebreselassie W, MD  isosorbide  mononitrate (IMDUR ) 30 MG 24 hr tablet Take 1 tablet by mouth once daily 12/24/23   Nahser, Aleene PARAS, MD  loratadine  (CLARITIN ) 10 MG tablet Take 1 tablet by mouth once daily 11/13/24   Vicci Barnie NOVAK, MD  metFORMIN  (GLUCOPHAGE ) 1000 MG tablet TAKE 1 TABLET BY MOUTH TWICE DAILY WITH A MEAL 04/19/24   Vicci Barnie NOVAK, MD  metoprolol  succinate (TOPROL -XL) 25 MG 24 hr tablet Take 3 tablets by mouth once daily 12/24/23   Nahser, Aleene PARAS, MD  [facility] DELICA LANCETS 33G MISC Use as instructed to check blood sugar up to 3 times daily. 10/05/18   Vicci Barnie NOVAK, MD  ONETOUCH VERIO test strip USE STRIPS TO CHECK GLUCOSE UP TO THREE TIMES DAILY AS DIRECTED 12/14/20   Vicci Barnie NOVAK, MD  rivaroxaban  (XARELTO ) 20 MG TABS tablet Take 1 tablet  (20 mg total) by mouth daily with supper. 10/24/24   Swinyer, Rosaline HERO, NP  sacubitril -valsartan  (ENTRESTO ) 24-26 MG Take 1 tablet by mouth 2 (two) times daily. 02/02/24   Nahser, Aleene PARAS, MD  SKYRIZI  PEN 150 MG/ML pen Inject 1 mL (150 mg total) into the skin every 3 (three) months. 11/21/24   Alm Delon SAILOR, DO  spironolactone  (ALDACTONE ) 25 MG tablet Take 1 tablet (25 mg total) by mouth daily. 10/26/23   Nahser, Aleene PARAS, MD  tirzepatide  (MOUNJARO ) 15 MG/0.5ML Pen INJECT 15 MG SUBCUTANEOUSLY  ONCE A WEEK 09/21/24   Vicci Barnie NOVAK, MD  TRUEPLUS PEN NEEDLES 32G X 4 MM MISC USE AS DIRECTED AT BEDTIME. 08/23/20   Vicci Barnie NOVAK, MD    Family History Family History  Adopted: Yes  Problem Relation Age of Onset   Hypertension Mother    Hyperlipidemia Mother     Social History Social History[1]   Allergies   Codeine   Review of Systems Review of Systems  HENT:  Positive for congestion.   Respiratory:  Positive for cough.      Physical Exam Triage Vital Signs ED Triage Vitals  Encounter Vitals Group     BP      Girls Systolic BP Percentile      Girls Diastolic BP Percentile      Boys Systolic BP Percentile      Boys Diastolic BP Percentile      Pulse      Resp      Temp      Temp src      SpO2      Weight      Height      Head Circumference      Peak Flow      Pain Score      Pain Loc      Pain Education      Exclude from Growth Chart    No data found.  Updated Vital Signs BP 131/78   Pulse 84   Temp 99.3 F (37.4 C)   Resp 16   Ht 5' 5 (1.651 m)   Wt 230  lb (104.3 kg)   LMP 04/15/2015 Comment: last in may  SpO2 97%   BMI 38.27 kg/m   Visual Acuity Right Eye Distance:   Left Eye Distance:   Bilateral Distance:    Right Eye Near:   Left Eye Near:    Bilateral Near:     Physical Exam Vitals and nursing note reviewed.  Constitutional:      Appearance: Normal appearance. She is obese. She is ill-appearing.  HENT:     Head:  Normocephalic and atraumatic.     Right Ear: Tympanic membrane, ear canal and external ear normal.     Left Ear: Tympanic membrane, ear canal and external ear normal.     Mouth/Throat:     Mouth: Mucous membranes are moist.     Pharynx: Oropharynx is clear.     Comments: Hard palate: Erythematous, mildly indurated posterior oropharynx noted presumably from superficial burn Eyes:     Extraocular Movements: Extraocular movements intact.     Conjunctiva/sclera: Conjunctivae normal.     Pupils: Pupils are equal, round, and reactive to light.  Cardiovascular:     Rate and Rhythm: Normal rate and regular rhythm.     Heart sounds: Normal heart sounds.  Pulmonary:     Effort: Pulmonary effort is normal.     Breath sounds: Normal breath sounds. No wheezing, rhonchi or rales.     Comments: Infrequent nonproductive cough on exam Musculoskeletal:        General: Normal range of motion.  Skin:    General: Skin is warm and dry.  Neurological:     General: No focal deficit present.     Mental Status: She is alert and oriented to person, place, and time. Mental status is at baseline.  Psychiatric:        Mood and Affect: Mood normal.        Behavior: Behavior normal.      UC Treatments / Results  Labs (all labs ordered are listed, but only abnormal results are displayed) Labs Reviewed  POCT INFLUENZA A/B - Abnormal; Notable for the following components:      Result Value   Influenza B, POC Positive (*)    All other components within normal limits  POC SOFIA SARS ANTIGEN FIA - Abnormal; Notable for the following components:   SARS Coronavirus 2 Ag Positive (*)    All other components within normal limits    EKG   Radiology No results found.  Procedures Procedures (including critical care time)  Medications Ordered in UC Medications - No data to display  Initial Impression / Assessment and Plan / UC Course  I have reviewed the triage vital signs and the nursing  notes.  Pertinent labs & imaging results that were available during my care of the patient were reviewed by me and considered in my medical decision making (see chart for details).     MDM: 1.  COVID-19-Rx'd Paxlovid : Take as directed; 2.  Influenza B-Rx'd Tamiflu  75 mg capsule: Take 1 capsule twice daily x 5 days; 3.  Cough, unspecified type-Rx prednisone  20 mg tablet: Take 3 tablets p.o. daily x 5 days. Advised patient take medications as directed with food to completion.  Advised take prednisone  with first dose of Paxlovid  and Tamiflu  for the next 5 days.  Encouraged increase daily water intake to 64 ounces per day while taking these medications.  Advised if symptoms worsen and/or unresolved please follow-up with PCP or here for further evaluation.  Work note provided to  patient prior to discharge today. Final Clinical Impressions(s) / UC Diagnoses   Final diagnoses:  COVID-19  Influenza B  Cough, unspecified type     Discharge Instructions      Advised patient take medications as directed with food to completion.  Advised take prednisone  with first dose of Paxlovid  and Tamiflu  for the next 5 days.  Encouraged increase daily water intake to 64 ounces per day while taking these medications.  Advised if symptoms worsen and/or unresolved please follow-up with PCP or here for further evaluation.     ED Prescriptions     Medication Sig Dispense Auth. Provider   nirmatrelvir/ritonavir (PAXLOVID , 300/100,) 20 x 150 MG & 10 x 100MG  TBPK Take 3 tablets by mouth 2 (two) times daily for 5 days. Patient GFR is 70. Take nirmatrelvir (150 mg) two tablets twice daily for 5 days and ritonavir (100 mg) one tablet twice daily for 5 days. 30 tablet Kaiel Weide, FNP   oseltamivir  (TAMIFLU ) 75 MG capsule Take 1 capsule (75 mg total) by mouth every 12 (twelve) hours. 10 capsule Amanda Steuart, FNP   predniSONE  (DELTASONE ) 20 MG tablet Take 3 tabs PO daily x 5 days. 15 tablet Coda Mathey, FNP       PDMP not reviewed this encounter.     [1]  Social History Tobacco Use   Smoking status: Former    Current packs/day: 0.00    Average packs/day: 1 pack/day for 22.0 years (22.0 ttl pk-yrs)    Types: Cigarettes    Start date: 12/01/1988    Quit date: 12/01/2010    Years since quitting: 13.9   Smokeless tobacco: Never  Vaping Use   Vaping status: Never Used  Substance Use Topics   Alcohol use: Yes    Comment: social   Drug use: No     Teddy Sharper, FNP 11/25/24 1428  "

## 2024-11-25 NOTE — Discharge Instructions (Addendum)
 Advised patient take medications as directed with food to completion.  Advised take prednisone  with first dose of Paxlovid  and Tamiflu  for the next 5 days.  Encouraged increase daily water intake to 64 ounces per day while taking these medications.  Advised if symptoms worsen and/or unresolved please follow-up with PCP or here for further evaluation.

## 2024-11-25 NOTE — ED Triage Notes (Signed)
 Pt states that she has a cough, wheezing and nasal congestion. X4 days  Pt states that she burned the top of her mouth x2 days ago and hasn't been able to eat.  Pt states that she felt dizzy in the waiting room and isn't sure if its from her not being able to eat due to the pain of the roof of her mouth.

## 2024-11-28 ENCOUNTER — Ambulatory Visit: Admitting: Internal Medicine

## 2024-12-09 ENCOUNTER — Other Ambulatory Visit: Payer: Self-pay

## 2024-12-09 MED ORDER — ISOSORBIDE MONONITRATE ER 30 MG PO TB24: 30.0000 mg | ORAL_TABLET | Freq: Every day | ORAL | 3 refills | Status: AC

## 2024-12-09 MED ORDER — METOPROLOL SUCCINATE ER 25 MG PO TB24: 75.0000 mg | ORAL_TABLET | Freq: Every day | ORAL | 3 refills | Status: AC

## 2024-12-21 LAB — OPHTHALMOLOGY REPORT-SCANNED

## 2024-12-27 ENCOUNTER — Ambulatory Visit: Admitting: Internal Medicine

## 2024-12-29 ENCOUNTER — Other Ambulatory Visit: Payer: Self-pay | Admitting: Physician Assistant

## 2024-12-29 DIAGNOSIS — E782 Mixed hyperlipidemia: Secondary | ICD-10-CM

## 2024-12-29 DIAGNOSIS — R0609 Other forms of dyspnea: Secondary | ICD-10-CM

## 2024-12-29 DIAGNOSIS — I2510 Atherosclerotic heart disease of native coronary artery without angina pectoris: Secondary | ICD-10-CM

## 2024-12-29 DIAGNOSIS — Z79899 Other long term (current) drug therapy: Secondary | ICD-10-CM

## 2025-01-03 ENCOUNTER — Other Ambulatory Visit: Payer: Self-pay

## 2025-01-03 MED ORDER — SPIRONOLACTONE 25 MG PO TABS: 25.0000 mg | ORAL_TABLET | Freq: Every day | ORAL | 0 refills | Status: AC

## 2025-01-05 ENCOUNTER — Other Ambulatory Visit: Payer: Self-pay | Admitting: Physician Assistant

## 2025-01-06 MED ORDER — CLOPIDOGREL BISULFATE 75 MG PO TABS: 75.0000 mg | ORAL_TABLET | Freq: Every day | ORAL | 0 refills | Status: AC

## 2025-01-23 ENCOUNTER — Ambulatory Visit: Admitting: Internal Medicine

## 2025-02-13 ENCOUNTER — Ambulatory Visit: Admitting: "Endocrinology

## 2025-08-30 ENCOUNTER — Ambulatory Visit: Admitting: Dermatology
